# Patient Record
Sex: Male | Born: 1940 | Race: Black or African American | Hispanic: No | Marital: Married | Smoking: Former smoker
Health system: Southern US, Community
[De-identification: ages and names within clinical notes are randomized; demographics above are authoritative.]

## PROBLEM LIST (undated history)

## (undated) DIAGNOSIS — N189 Chronic kidney disease, unspecified: Secondary | ICD-10-CM

## (undated) DIAGNOSIS — I10 Essential (primary) hypertension: Secondary | ICD-10-CM

## (undated) DIAGNOSIS — Z992 Dependence on renal dialysis: Secondary | ICD-10-CM

## (undated) DIAGNOSIS — E119 Type 2 diabetes mellitus without complications: Secondary | ICD-10-CM

---

## 2001-12-08 ENCOUNTER — Inpatient Hospital Stay (HOSPITAL_COMMUNITY): Admission: EM | Admit: 2001-12-08 | Discharge: 2001-12-11 | Payer: Self-pay | Admitting: *Deleted

## 2001-12-08 ENCOUNTER — Encounter: Payer: Self-pay | Admitting: *Deleted

## 2011-11-03 DIAGNOSIS — E119 Type 2 diabetes mellitus without complications: Secondary | ICD-10-CM | POA: Diagnosis not present

## 2011-11-03 DIAGNOSIS — L609 Nail disorder, unspecified: Secondary | ICD-10-CM | POA: Diagnosis not present

## 2011-11-03 DIAGNOSIS — L851 Acquired keratosis [keratoderma] palmaris et plantaris: Secondary | ICD-10-CM | POA: Diagnosis not present

## 2011-11-11 DIAGNOSIS — E785 Hyperlipidemia, unspecified: Secondary | ICD-10-CM | POA: Diagnosis not present

## 2011-11-11 DIAGNOSIS — Z6841 Body Mass Index (BMI) 40.0 and over, adult: Secondary | ICD-10-CM | POA: Diagnosis not present

## 2011-11-11 DIAGNOSIS — I10 Essential (primary) hypertension: Secondary | ICD-10-CM | POA: Diagnosis not present

## 2011-11-11 DIAGNOSIS — E119 Type 2 diabetes mellitus without complications: Secondary | ICD-10-CM | POA: Diagnosis not present

## 2011-11-11 DIAGNOSIS — Z23 Encounter for immunization: Secondary | ICD-10-CM | POA: Diagnosis not present

## 2012-04-05 DIAGNOSIS — Z23 Encounter for immunization: Secondary | ICD-10-CM | POA: Diagnosis not present

## 2012-05-18 DIAGNOSIS — Z6841 Body Mass Index (BMI) 40.0 and over, adult: Secondary | ICD-10-CM | POA: Diagnosis not present

## 2012-05-18 DIAGNOSIS — IMO0001 Reserved for inherently not codable concepts without codable children: Secondary | ICD-10-CM | POA: Diagnosis not present

## 2012-05-18 DIAGNOSIS — Z7182 Exercise counseling: Secondary | ICD-10-CM | POA: Diagnosis not present

## 2012-05-18 DIAGNOSIS — Z713 Dietary counseling and surveillance: Secondary | ICD-10-CM | POA: Diagnosis not present

## 2012-05-22 DIAGNOSIS — I10 Essential (primary) hypertension: Secondary | ICD-10-CM | POA: Diagnosis not present

## 2012-05-22 DIAGNOSIS — Z7182 Exercise counseling: Secondary | ICD-10-CM | POA: Diagnosis not present

## 2012-05-22 DIAGNOSIS — E785 Hyperlipidemia, unspecified: Secondary | ICD-10-CM | POA: Diagnosis not present

## 2012-05-22 DIAGNOSIS — IMO0001 Reserved for inherently not codable concepts without codable children: Secondary | ICD-10-CM | POA: Diagnosis not present

## 2012-06-14 DIAGNOSIS — E119 Type 2 diabetes mellitus without complications: Secondary | ICD-10-CM | POA: Diagnosis not present

## 2012-06-14 DIAGNOSIS — H2589 Other age-related cataract: Secondary | ICD-10-CM | POA: Diagnosis not present

## 2012-06-14 DIAGNOSIS — H5200 Hypermetropia, unspecified eye: Secondary | ICD-10-CM | POA: Diagnosis not present

## 2012-06-14 DIAGNOSIS — H35039 Hypertensive retinopathy, unspecified eye: Secondary | ICD-10-CM | POA: Diagnosis not present

## 2012-07-14 DIAGNOSIS — E119 Type 2 diabetes mellitus without complications: Secondary | ICD-10-CM | POA: Diagnosis not present

## 2012-07-14 DIAGNOSIS — Z125 Encounter for screening for malignant neoplasm of prostate: Secondary | ICD-10-CM | POA: Diagnosis not present

## 2013-01-02 DIAGNOSIS — Z6841 Body Mass Index (BMI) 40.0 and over, adult: Secondary | ICD-10-CM | POA: Diagnosis not present

## 2013-01-02 DIAGNOSIS — I10 Essential (primary) hypertension: Secondary | ICD-10-CM | POA: Diagnosis not present

## 2013-01-02 DIAGNOSIS — E119 Type 2 diabetes mellitus without complications: Secondary | ICD-10-CM | POA: Diagnosis not present

## 2013-01-02 DIAGNOSIS — E785 Hyperlipidemia, unspecified: Secondary | ICD-10-CM | POA: Diagnosis not present

## 2013-02-23 ENCOUNTER — Emergency Department (HOSPITAL_COMMUNITY): Payer: Medicare Other

## 2013-02-23 ENCOUNTER — Inpatient Hospital Stay (HOSPITAL_COMMUNITY)
Admission: EM | Admit: 2013-02-23 | Discharge: 2013-02-26 | DRG: 637 | Disposition: A | Discharge status: Home / Self Care | Payer: Medicare Other | Attending: Internal Medicine | Admitting: Internal Medicine

## 2013-02-23 ENCOUNTER — Encounter (HOSPITAL_COMMUNITY): Payer: Self-pay | Admitting: *Deleted

## 2013-02-23 DIAGNOSIS — E669 Obesity, unspecified: Secondary | ICD-10-CM | POA: Diagnosis present

## 2013-02-23 DIAGNOSIS — E1310 Other specified diabetes mellitus with ketoacidosis without coma: Secondary | ICD-10-CM | POA: Diagnosis present

## 2013-02-23 DIAGNOSIS — E785 Hyperlipidemia, unspecified: Secondary | ICD-10-CM | POA: Diagnosis present

## 2013-02-23 DIAGNOSIS — I10 Essential (primary) hypertension: Secondary | ICD-10-CM | POA: Diagnosis present

## 2013-02-23 DIAGNOSIS — E1129 Type 2 diabetes mellitus with other diabetic kidney complication: Secondary | ICD-10-CM | POA: Diagnosis present

## 2013-02-23 DIAGNOSIS — N179 Acute kidney failure, unspecified: Secondary | ICD-10-CM | POA: Diagnosis present

## 2013-02-23 DIAGNOSIS — E1165 Type 2 diabetes mellitus with hyperglycemia: Secondary | ICD-10-CM | POA: Diagnosis present

## 2013-02-23 DIAGNOSIS — N289 Disorder of kidney and ureter, unspecified: Secondary | ICD-10-CM

## 2013-02-23 DIAGNOSIS — N058 Unspecified nephritic syndrome with other morphologic changes: Secondary | ICD-10-CM | POA: Diagnosis present

## 2013-02-23 DIAGNOSIS — Z79899 Other long term (current) drug therapy: Secondary | ICD-10-CM

## 2013-02-23 DIAGNOSIS — E1110 Type 2 diabetes mellitus with ketoacidosis without coma: Secondary | ICD-10-CM | POA: Diagnosis not present

## 2013-02-23 DIAGNOSIS — N189 Chronic kidney disease, unspecified: Secondary | ICD-10-CM | POA: Diagnosis not present

## 2013-02-23 DIAGNOSIS — E1122 Type 2 diabetes mellitus with diabetic chronic kidney disease: Secondary | ICD-10-CM | POA: Insufficient documentation

## 2013-02-23 DIAGNOSIS — E119 Type 2 diabetes mellitus without complications: Secondary | ICD-10-CM

## 2013-02-23 DIAGNOSIS — N182 Chronic kidney disease, stage 2 (mild): Secondary | ICD-10-CM | POA: Diagnosis present

## 2013-02-23 DIAGNOSIS — I129 Hypertensive chronic kidney disease with stage 1 through stage 4 chronic kidney disease, or unspecified chronic kidney disease: Secondary | ICD-10-CM | POA: Diagnosis present

## 2013-02-23 DIAGNOSIS — IMO0001 Reserved for inherently not codable concepts without codable children: Secondary | ICD-10-CM

## 2013-02-23 DIAGNOSIS — R5381 Other malaise: Secondary | ICD-10-CM | POA: Diagnosis not present

## 2013-02-23 DIAGNOSIS — G9340 Encephalopathy, unspecified: Secondary | ICD-10-CM | POA: Diagnosis not present

## 2013-02-23 DIAGNOSIS — Z6835 Body mass index (BMI) 35.0-35.9, adult: Secondary | ICD-10-CM

## 2013-02-23 DIAGNOSIS — R404 Transient alteration of awareness: Secondary | ICD-10-CM | POA: Diagnosis not present

## 2013-02-23 DIAGNOSIS — R5383 Other fatigue: Secondary | ICD-10-CM | POA: Diagnosis not present

## 2013-02-23 DIAGNOSIS — E870 Hyperosmolality and hypernatremia: Secondary | ICD-10-CM | POA: Diagnosis not present

## 2013-02-23 DIAGNOSIS — R9431 Abnormal electrocardiogram [ECG] [EKG]: Secondary | ICD-10-CM | POA: Diagnosis present

## 2013-02-23 HISTORY — DX: Essential (primary) hypertension: I10

## 2013-02-23 HISTORY — DX: Type 2 diabetes mellitus without complications: E11.9

## 2013-02-23 LAB — BASIC METABOLIC PANEL
BUN: 66 mg/dL — ABNORMAL HIGH (ref 6–23)
BUN: 60 mg/dL — ABNORMAL HIGH (ref 6–23)
CO2: 21 mEq/L (ref 19–32)
Calcium: 9.3 mg/dL (ref 8.4–10.5)
Calcium: 8.9 mg/dL (ref 8.4–10.5)
Calcium: 9 mg/dL (ref 8.4–10.5)
Calcium: 9.1 mg/dL (ref 8.4–10.5)
Chloride: 129 mEq/L — ABNORMAL HIGH (ref 96–112)
Creatinine, Ser: 2.67 mg/dL — ABNORMAL HIGH (ref 0.50–1.35)
Creatinine, Ser: 1.97 mg/dL — ABNORMAL HIGH (ref 0.50–1.35)
Creatinine, Ser: 2.03 mg/dL — ABNORMAL HIGH (ref 0.50–1.35)
GFR calc Af Amer: 31 mL/min — ABNORMAL LOW (ref >90)
GFR calc Af Amer: 38 mL/min — ABNORMAL LOW (ref >90)
GFR calc Af Amer: 37 mL/min — ABNORMAL LOW (ref >90)
GFR calc non Af Amer: 27 mL/min — ABNORMAL LOW (ref >90)
GFR calc non Af Amer: 33 mL/min — ABNORMAL LOW (ref >90)
GFR calc non Af Amer: 32 mL/min — ABNORMAL LOW (ref >90)
Glucose, Bld: 844 mg/dL (ref 70–99)
Potassium: 4.1 mEq/L (ref 3.5–5.1)
Sodium: 152 mEq/L — ABNORMAL HIGH (ref 135–145)

## 2013-02-23 LAB — URINALYSIS, ROUTINE W REFLEX MICROSCOPIC
Bilirubin Urine: NEGATIVE
Protein, ur: NEGATIVE mg/dL
Urobilinogen, UA: 0.2 mg/dL (ref 0.0–1.0)

## 2013-02-23 LAB — LIPID PANEL
Cholesterol: 154 mg/dL (ref 0–200)
HDL: 40 mg/dL (ref >39)
LDL Cholesterol: 60 mg/dL (ref 0–99)
Triglycerides: 268 mg/dL — ABNORMAL HIGH (ref <150)
VLDL: 54 mg/dL — ABNORMAL HIGH (ref 0–40)

## 2013-02-23 LAB — GLUCOSE, CAPILLARY
Glucose-Capillary: >600 mg/dL (ref 70–99)
Glucose-Capillary: >600 mg/dL (ref 70–99)
Glucose-Capillary: >600 mg/dL (ref 70–99)
Glucose-Capillary: 479 mg/dL — ABNORMAL HIGH (ref 70–99)
Glucose-Capillary: 396 mg/dL — ABNORMAL HIGH (ref 70–99)
Glucose-Capillary: 196 mg/dL — ABNORMAL HIGH (ref 70–99)

## 2013-02-23 LAB — CBC WITH DIFFERENTIAL/PLATELET
Basophils Absolute: 0 10*3/uL (ref 0.0–0.1)
Basophils Relative: 0 % (ref 0–1)
Eosinophils Absolute: 0 10*3/uL (ref 0.0–0.7)
Eosinophils Relative: 0 % (ref 0–5)
Lymphocytes Relative: 13 % (ref 12–46)
MCV: 93.5 fL (ref 78.0–100.0)
Platelets: 160 10*3/uL (ref 150–400)
RDW: 13.9 % (ref 11.5–15.5)
WBC: 9.7 10*3/uL (ref 4.0–10.5)

## 2013-02-23 LAB — BLOOD GAS, ARTERIAL
FIO2: 21 %
O2 Saturation: 91.6 %
Patient temperature: 37
pO2, Arterial: 70.1 mmHg — ABNORMAL LOW (ref 80.0–100.0)

## 2013-02-23 LAB — URINE MICROSCOPIC-ADD ON

## 2013-02-23 LAB — KETONES, QUALITATIVE

## 2013-02-23 MED ORDER — SODIUM CHLORIDE 0.9 % IV SOLN
INTRAVENOUS | Status: DC
  Filled 2013-02-23: qty 1

## 2013-02-23 MED ORDER — SODIUM CHLORIDE 0.45 % IV BOLUS
500.0000 mL | Freq: Once | INTRAVENOUS | Status: AC
  Administered 2013-02-23: 17:00:00 via INTRAVENOUS

## 2013-02-23 MED ORDER — SODIUM CHLORIDE 0.9 % IV SOLN
1000.0000 mL | Freq: Once | INTRAVENOUS | Status: AC
  Administered 2013-02-23: 1000 mL via INTRAVENOUS

## 2013-02-23 MED ORDER — DEXTROSE-NACL 5-0.45 % IV SOLN
INTRAVENOUS | Status: DC
  Administered 2013-02-23: 23:00:00 via INTRAVENOUS

## 2013-02-23 MED ORDER — SODIUM CHLORIDE 0.9 % IV SOLN
INTRAVENOUS | Status: DC
  Administered 2013-02-23: 5.4 [IU]/h via INTRAVENOUS
  Administered 2013-02-23: 10.8 [IU]/h via INTRAVENOUS
  Administered 2013-02-23: 23:00:00 via INTRAVENOUS
  Administered 2013-02-23: 5.4 [IU]/h via INTRAVENOUS
  Filled 2013-02-23: qty 1

## 2013-02-23 MED ORDER — SODIUM CHLORIDE 0.9 % IV SOLN: 1000.0000 mL | INTRAVENOUS | Status: DC

## 2013-02-23 MED ORDER — SODIUM CHLORIDE 0.9 % IV SOLN: INTRAVENOUS | Status: DC

## 2013-02-23 MED ORDER — DEXTROSE 50 % IV SOLN: 25.0000 mL | INTRAVENOUS | Status: DC | PRN

## 2013-02-23 MED ORDER — SODIUM CHLORIDE 0.9 % IV SOLN: INTRAVENOUS | Status: AC

## 2013-02-23 MED ORDER — ONDANSETRON HCL 4 MG/2ML IJ SOLN: 4.0000 mg | Freq: Three times a day (TID) | INTRAMUSCULAR | Status: DC | PRN

## 2013-02-23 MED ORDER — SODIUM CHLORIDE 0.9 % IV SOLN
INTRAVENOUS | Status: DC
  Administered 2013-02-23: 15:00:00 via INTRAVENOUS

## 2013-02-23 MED ORDER — POTASSIUM CHLORIDE 10 MEQ/100ML IV SOLN
10.0000 meq | INTRAVENOUS | Status: AC
  Administered 2013-02-23 (×2): 10 meq via INTRAVENOUS
  Filled 2013-02-23: qty 200

## 2013-02-23 MED ORDER — ENOXAPARIN SODIUM 40 MG/0.4ML ~~LOC~~ SOLN
40.0000 mg | SUBCUTANEOUS | Status: DC
  Administered 2013-02-23 – 2013-02-25 (×3): 40 mg via SUBCUTANEOUS
  Filled 2013-02-23 (×3): qty 0.4

## 2013-02-23 NOTE — ED Notes (Signed)
Pt incontinent of urine, all lines changed, and pt cleaned. Foley catheter placed per orders.

## 2013-02-23 NOTE — ED Notes (Signed)
Pt presents via EMS secondary to weakness, hyperglycemia, and confusion per family. Pt has strong smell of urine, and noted wet/soiled clothes on. Pt continues to be incontinent of urine.  Lips and mucus membranes are dry and lips cracked. Pt is confused to time, place and events.  Pt reports glucose was 89 this morning per his machine.

## 2013-02-23 NOTE — H&P (Signed)
[facility] History and Physical  Kyle Hicks [id number] DOB: [birth year] DOA: 02/23/2013  Referring physician:  PCP: Purvis Kilts, MD  Specialists:   Chief Complaint: hyperglycemia and confusion  HPI: Kyle Hicks is a 72 y.o. male with past medical history that includes diabetes type II hypertension hyperlipidemia presents to the emergency department via EMS with chief complaint of hyperglycemia and confusion. Information is obtained from the chart and the patient but note due to patient's confusion information from him may not be reliable. Patient reports that he is not sure why he came here he didn't think there was anything wrong with him. Wife reported that she called EMS and she was concerned when patients was obviously very weak this morning. Chart also indicates that patient's glucose has been high for the last 5 days. Per EMS patient's blood glucose was 588 when they arrived. Patient's wife also reports that he's developed urinary frequency and has been urinating every 30 minutes. He denies dysuria hematuria. He denies any chest pain palpitations cough shortness of breath dizziness headache fever or chills. Denies any abdominal pain nausea vomiting constipation or diarrhea. He denies any skin lesions or foot ulcers. Lab values in the emergency room are significant for hemoglobin of 17.8 hematocrit 55.8 chloride 94 BUN 75 creatinine 2.67 and lactic acid of 2.9. Arterial blood gas yields a pH of 7.33 a PaO2 of 70.1 a bicarbonate of 18.6. Chest x-ray yields mild cardiac enlargement and moderate tortuosity and ectasia of the thoracic aorta. No acute findings. EKG yields a normal sinus rhythm with prolonged QT. Vital signs are stable. He is to receive 2 L of normal saline and insulin drip was started while in the emergency room. Symptoms came on gradually have worsened and characterized as severe.   Review of Systems: The patient denies anorexia, fever, weight loss,, vision loss,  decreased hearing, hoarseness, chest pain, syncope, dyspnea on exertion, peripheral edema, balance deficits, hemoptysis, abdominal pain, melena, hematochezia, severe indigestion/heartburn, hematuria, incontinence, genital sores, muscle weakness, suspicious skin lesions, transient blindness, difficulty walking, depression, unusual weight change, abnormal bleeding, enlarged lymph nodes, angioedema, and breast masses.    Past Medical History  Diagnosis Date  . Diabetes mellitus without complication    History reviewed. No pertinent past surgical history. Social History:  reports that he has never smoked. He does not have any smokeless tobacco history on file. He reports that he does not drink alcohol or use illicit drugs. Patient lives at home with his wife. He is independent with his ADLs. No Known Allergies  No family history on file. unable to obtain at the time of admission due to patient's encephalopathy  Prior to Admission medications   Medication Sig Start Date End Date Taking? Authorizing Provider  aspirin EC 81 MG tablet Take 81 mg by mouth daily.   Yes Historical Provider, MD  atorvastatin (LIPITOR) 20 MG tablet Take 20 mg by mouth daily.   Yes Historical Provider, MD  hydrochlorothiazide (HYDRODIURIL) 25 MG tablet Take 25 mg by mouth daily.   Yes Historical Provider, MD  NIFEdipine (PROCARDIA-XL/ADALAT-CC/NIFEDICAL-XL) 30 MG 24 hr tablet Take 30 mg by mouth daily.   Yes Historical Provider, MD  ramipril (ALTACE) 10 MG capsule Take 10 mg by mouth daily.   Yes Historical Provider, MD  sitaGLIPtan-metformin (JANUMET) 50-500 MG per tablet Take 1 tablet by mouth 2 (two) times daily with a meal.   Yes Historical Provider, MD   Physical Exam: Filed Vitals:   02/23/13 1130 02/23/13 1159  BP:  113/81  Pulse:  97  Resp:  23  Height:  5' 10.5" (1.791 m)  SpO2: 96% 95%     General:  Obese no acute distress cooperative, somewhat unkept  Eyes: PE RRL EOMI  ENT: Ears clear nose without  drainage oropharynx without erythema or exudate. Mucous membranes of his mouth are very dry but pain. Very poor dentition  Neck: Supple no JVD full range of motion no lymphadenopathy  Cardiovascular: Tachycardic but regular no murmur no gallop no rub trace lower extremity edema  Respiratory: Normal effort breath sounds clear to auscultation bilaterally no rhonchi no wheeze no crackles  Abdomen: Obese soft positive bowel sounds nontender in all 4 quadrants no mass no organomegaly noted  Skin: Warm and dry no rash no lesions on chronic venous changes to left lower extremity no foot ulcers noted  Musculoskeletal: No clubbing no cyanosis  Psychiatric: Calm cooperative  Neurologic: Oriented to self and place. Cranial nerves II through XII grossly intact. Muscle tone appears normal  Labs on Admission:  Basic Metabolic Panel:  Recent Labs Lab 02/23/13 1201  NA 138  K 4.8  CL 94*  CO2 21  GLUCOSE 1211*  BUN 75*  CREATININE 2.67*  CALCIUM 9.3   CBC:  Recent Labs Lab 02/23/13 1201  WBC 9.7  NEUTROABS 7.7  HGB 17.8*  HCT 55.8*  MCV 93.5  PLT 160   Cardiac Enzymes:  Recent Labs Lab 02/23/13 1201  TROPONINI <0.30   CBG:  Recent Labs Lab 02/23/13 1203 02/23/13 1319  GLUCAP >600* >600*    Radiological Exams on Admission: Dg Chest 2 View  02/23/2013   *RADIOLOGY REPORT*  Clinical Data: Weakness.  CHEST - 2 VIEW  Comparison: None  Findings: The heart is mildly enlarged.  There is a tortuosity and ectasia of the thoracic aorta.  The pulmonary hila appear normal. Focal eventration of the left hemidiaphragm is noted.  The lungs are clear.  No pleural effusion.  The bony thorax is intact.  IMPRESSION: Mild cardiac enlargement and moderate tortuosity and ectasia of the thoracic aorta. No acute pulmonary findings.   Original Report Authenticated By: Marijo Sanes, M.D.   EKG: Independently reviewed. Normal sinus rhythm with prolonged QT  Assessment/Plan  Hyperglycemic  hyperosmolar state with ketoacidosis - acute etiology uncertain, will check [id number] to determine glucose control at baseline. Discussion with wife indicates patient is very compliant with medications and somewhat compliant with diet. She does indicate however that he does not check his blood sugar frequently. Last several days he has checked it every day and has been gradually going up. No signs of infection. Urinalysis and chest x-ray unremarkable. Will admit to step down unit. Anion gap 23. Continue IV insulin via the glucose stabilizer protocol. Will aggressively hydrate monitor  CBGs and electrolytes every 2 hours. Once his glucose is less than 250 will transition his fluids and introduce a basal insulin. Will check hemoglobin A 1c  Encephalopathy acute: Likely related to #1. Already starting to improve at the time of my admitting exam. Will continue to monitor  Acute on chronic renal failure: Creatinine 2.6 on admission. Review of shows no documentation of baseline. I am presuming given his diabetes he has some degree of chronic renal failure. Will request records from Dr. Delanna Ahmadi office. Will hold any nephrotoxins and provide aggressive IV hydration.  Hypertension controlled in the emergency room. Med rec indicates that patient is on hydrochlorothiazide and Procardia XL. Will hold for now. Will monitor closely and reintroduces  indicated  Hyperlipidemia: Will check lipid panel. Patient on a statin. Will hold for now do to n.p.o. status.  Obesity: Nutritional consult   Code Status: full Family Communication: wife on phone Disposition Plan: home when ready likely 2-3 days  Time spent: 61 minutes  [facility] Pager [phone]  If 7PM-7AM, please contact night-coverage [email or web address] Password [facility] 02/23/2013, 1:55 PM      I have seen and examined this patient and I agree with the above assessment and plan.  Marzetta Board, MD [facility] [phone]

## 2013-02-23 NOTE — ED Notes (Signed)
Pt arrived via EMS secondary to weakness, and hyperglycemic episode.  Glucose of 588 per EMS

## 2013-02-23 NOTE — ED Notes (Signed)
CRITICAL VALUE ALERT  Critical value received:  Glucose 1211  Date of notification:  02/23/2013  Time of notification:  [id number]  Critical value read back Glucose 1211  Nurse who received alert: [id number]  MD notified (1st page): Rancour

## 2013-02-23 NOTE — Progress Notes (Signed)
MD made aware that 1700 BMET hemolyzed and lab was unable to run. Order canceled and scheduled to be redrawn at 1900.

## 2013-02-23 NOTE — ED Provider Notes (Signed)
CSN: [id number]     Arrival date & time 02/23/13  1127 History    This chart was scribed for Ezequiel Essex, MD by Roxan Diesel, ED scribe.  This patient was seen in room APA07 and the patient's care was started at 11:29 AM.     Chief Complaint  Patient presents with  . Diabetes    The history is provided by the patient, the EMS personnel and the spouse. No language interpreter was used.    HPI Comments: Kyle Hicks is a 72 y.o. male with h/o DM, HTN and hyperlipidemia brought in by ambulance to the Emergency Department complaining of gradually-worsening hyperglycemia that began 5 days ago.  EMS was called by a relative who became concerned when pt presented with weakness this morning.  Per EMS his blood glucose was 588 when they arrived.  Pt's wife reports that he has developed urinary frequency recently and has been urinating every 30 minutes.  He denies emesis, fever, cough, constipation, diarrhea, abdominal pain, CP, SOB, dizziness, lightheadedness, recent falls, or pain to any other area.  He states he has been eating and drinking normally.  Pt reports that he does not use insuiln but has been taking an unspecified pill for his DM.    PCP is Dr. Hilma Favors   Past Medical History  Diagnosis Date  . Diabetes mellitus without complication   . HTN (hypertension)      History reviewed. No pertinent past surgical history.   No family history on file.   History  Substance Use Topics  . Smoking status: Never Smoker   . Smokeless tobacco: Not on file  . Alcohol Use: No     Review of Systems A complete 10 system review of systems was obtained and all systems are negative except as noted in the HPI and PMH.     Allergies  Review of patient's allergies indicates no known allergies.  Home Medications  No current outpatient prescriptions on file.  BP 135/85  Pulse 94  Temp(Src) 97.5 F (36.4 C) (Oral)  Resp 18  Ht 5\' 11"  (1.803 m)  Wt 252 lb 6.8 oz (114.5 kg)  BMI  35.22 kg/m2  SpO2 97%  Physical Exam  Nursing note and vitals reviewed. Constitutional: He is oriented to person, place, and time. He appears well-developed and well-nourished. No distress.  Disheveled, smells of urine.  HENT:  Head: Normocephalic and atraumatic.  Mouth/Throat: Oropharynx is clear and moist. Mucous membranes are dry. No oropharyngeal exudate.  Dry MMs.  Eyes: Conjunctivae and EOM are normal.  Neck: Neck supple. No tracheal deviation present.  Cardiovascular: Regular rhythm and normal heart sounds.  Tachycardia present.   No murmur heard. Sinus tachycardia.  Pulmonary/Chest: Effort normal and breath sounds normal. No respiratory distress. He has no wheezes. He has no rales.  Abdominal: Soft. There is no tenderness.  Obese. Non-tender abdomen.  Musculoskeletal: Normal range of motion.  Neurological: He is alert and oriented to person, place, and time. No cranial nerve deficit.  No ataxia on finger to nose, 5/5 strength throughout  Cranial nerves 2-12 intact.  Skin: Skin is warm and dry.  Psychiatric: He has a normal mood and affect. His behavior is normal.    ED Course  Procedures (including critical care time)  DIAGNOSTIC STUDIES: Oxygen Saturation is 96% on room air, normal by my interpretation.    COORDINATION OF CARE: 11:35 AM-Discussed treatment plan which includes insulin injection, IV fluids, CXR, labs and EKG with pt at bedside and  pt agreed to plan.   12:54 PM: Informed pt that labs reveal severely elevated blood glucose and advised admission for further evaluation and treatment.  Pt expressed understanding and agreed to plan.   Labs Reviewed  KETONES, QUALITATIVE - Abnormal; Notable for the following:    Acetone, Bld SMALL (*)    All other components within normal limits  BASIC METABOLIC PANEL - Abnormal; Notable for the following:    Chloride 94 (*)    Glucose, Bld 1211 (*)    BUN 75 (*)    Creatinine, Ser 2.67 (*)    GFR calc non Af Amer 23  (*)    GFR calc Af Amer 26 (*)    All other components within normal limits  CBC WITH DIFFERENTIAL - Abnormal; Notable for the following:    RBC 5.97 (*)    Hemoglobin 17.8 (*)    HCT 55.8 (*)    Neutrophils Relative % 79 (*)    All other components within normal limits  BLOOD GAS, ARTERIAL - Abnormal; Notable for the following:    pH, Arterial 7.335 (*)    pO2, Arterial 70.1 (*)    Bicarbonate 18.6 (*)    Acid-base deficit 6.2 (*)    All other components within normal limits  URINALYSIS, ROUTINE W REFLEX MICROSCOPIC - Abnormal; Notable for the following:    Specific Gravity, Urine <1.005 (*)    Glucose, UA >1000 (*)    Hgb urine dipstick TRACE (*)    Ketones, ur TRACE (*)    All other components within normal limits  LACTIC ACID, PLASMA - Abnormal; Notable for the following:    Lactic Acid, Venous 2.9 (*)    All other components within normal limits  GLUCOSE, CAPILLARY - Abnormal; Notable for the following:    Glucose-Capillary >600 (*)    All other components within normal limits  GLUCOSE, CAPILLARY - Abnormal; Notable for the following:    Glucose-Capillary >600 (*)    All other components within normal limits  GLUCOSE, CAPILLARY - Abnormal; Notable for the following:    Glucose-Capillary >600 (*)    All other components within normal limits  BASIC METABOLIC PANEL - Abnormal; Notable for the following:    Sodium 146 (*)    BUN 66 (*)    Creatinine, Ser 2.31 (*)    GFR calc non Af Amer 27 (*)    GFR calc Af Amer 31 (*)    All other components within normal limits  LIPID PANEL - Abnormal; Notable for the following:    Triglycerides 268 (*)    VLDL 54 (*)    All other components within normal limits  MRSA PCR SCREENING  TROPONIN I  URINE MICROSCOPIC-ADD ON  BASIC METABOLIC PANEL  BASIC METABOLIC PANEL  BASIC METABOLIC PANEL  HEMOGLOBIN A1C    Dg Chest 2 View  02/23/2013   *RADIOLOGY REPORT*  Clinical Data: Weakness.  CHEST - 2 VIEW  Comparison: None  Findings: The  heart is mildly enlarged.  There is a tortuosity and ectasia of the thoracic aorta.  The pulmonary hila appear normal. Focal eventration of the left hemidiaphragm is noted.  The lungs are clear.  No pleural effusion.  The bony thorax is intact.  IMPRESSION: Mild cardiac enlargement and moderate tortuosity and ectasia of the thoracic aorta. No acute pulmonary findings.   Original Report Authenticated By: Marijo Sanes, M.D.    1. DKA (diabetic ketoacidoses)   2. Renal insufficiency   3. Diabetes mellitus without complication  4. HTN (hypertension)      MDM  From home with weakness, hyperglycemia, foul-smelling urine. Patient denies any pain. Generalized weakness and dry mucus membranes without focal deficits.  Concern for DKA. BS>600 Anion gap 23. IVF and insulin gtt started. No evidence of infection in CXR or UA.  Cr. 2.67 without comparison, suspect some element of dehydration and CKD.  Mental status and vitals stable throughout ED course.  Stepdown admission for DKA d/w Dr. Roderic Palau.   Date: 02/23/2013  Rate: 99  Rhythm: normal sinus rhythm  QRS Axis: left  Intervals: QT prolonged  ST/T Wave abnormalities: nonspecific ST/T changes  Conduction Disutrbances:none  Narrative Interpretation:   Old EKG Reviewed: none available  CRITICAL CARE Performed by: Ezequiel Essex Total critical care time: 30 Critical care time was exclusive of separately billable procedures and treating other patients. Critical care was necessary to treat or prevent imminent or life-threatening deterioration. Critical care was time spent personally by me on the following activities: development of treatment plan with patient and/or surrogate as well as nursing, discussions with consultants, evaluation of patient's response to treatment, examination of patient, obtaining history from patient or surrogate, ordering and performing treatments and interventions, ordering and review of laboratory studies, ordering and  review of radiographic studies, pulse oximetry and re-evaluation of patient's condition.   I personally performed the services described in this documentation, which was scribed in my presence. The recorded information has been reviewed and is accurate.    Ezequiel Essex, MD 02/23/13 1550

## 2013-02-24 DIAGNOSIS — E1110 Type 2 diabetes mellitus with ketoacidosis without coma: Secondary | ICD-10-CM | POA: Diagnosis not present

## 2013-02-24 DIAGNOSIS — E870 Hyperosmolality and hypernatremia: Secondary | ICD-10-CM | POA: Diagnosis not present

## 2013-02-24 DIAGNOSIS — N189 Chronic kidney disease, unspecified: Secondary | ICD-10-CM | POA: Diagnosis not present

## 2013-02-24 DIAGNOSIS — N179 Acute kidney failure, unspecified: Secondary | ICD-10-CM | POA: Diagnosis not present

## 2013-02-24 LAB — BASIC METABOLIC PANEL
BUN: 47 mg/dL — ABNORMAL HIGH (ref 6–23)
BUN: 55 mg/dL — ABNORMAL HIGH (ref 6–23)
BUN: 58 mg/dL — ABNORMAL HIGH (ref 6–23)
CO2: 26 mEq/L (ref 19–32)
CO2: 25 mEq/L (ref 19–32)
CO2: 23 mEq/L (ref 19–32)
Calcium: 8.6 mg/dL (ref 8.4–10.5)
Calcium: 9.2 mg/dL (ref 8.4–10.5)
Chloride: 116 mEq/L — ABNORMAL HIGH (ref 96–112)
Chloride: 119 mEq/L — ABNORMAL HIGH (ref 96–112)
Chloride: 119 mEq/L — ABNORMAL HIGH (ref 96–112)
Creatinine, Ser: 1.96 mg/dL — ABNORMAL HIGH (ref 0.50–1.35)
GFR calc Af Amer: 38 mL/min — ABNORMAL LOW (ref >90)
GFR calc non Af Amer: 33 mL/min — ABNORMAL LOW (ref >90)
GFR calc non Af Amer: 36 mL/min — ABNORMAL LOW (ref >90)
GFR calc non Af Amer: 39 mL/min — ABNORMAL LOW (ref >90)
Glucose, Bld: 143 mg/dL — ABNORMAL HIGH (ref 70–99)
Glucose, Bld: 157 mg/dL — ABNORMAL HIGH (ref 70–99)
Glucose, Bld: 337 mg/dL — ABNORMAL HIGH (ref 70–99)
Potassium: 3.6 mEq/L (ref 3.5–5.1)
Potassium: 3.3 mEq/L — ABNORMAL LOW (ref 3.5–5.1)
Potassium: 3.6 mEq/L (ref 3.5–5.1)
Sodium: 156 mEq/L — ABNORMAL HIGH (ref 135–145)
Sodium: 153 mEq/L — ABNORMAL HIGH (ref 135–145)
Sodium: 149 mEq/L — ABNORMAL HIGH (ref 135–145)

## 2013-02-24 LAB — GLUCOSE, CAPILLARY
Glucose-Capillary: 133 mg/dL — ABNORMAL HIGH (ref 70–99)
Glucose-Capillary: 139 mg/dL — ABNORMAL HIGH (ref 70–99)
Glucose-Capillary: 205 mg/dL — ABNORMAL HIGH (ref 70–99)
Glucose-Capillary: 174 mg/dL — ABNORMAL HIGH (ref 70–99)
Glucose-Capillary: 159 mg/dL — ABNORMAL HIGH (ref 70–99)
Glucose-Capillary: 210 mg/dL — ABNORMAL HIGH (ref 70–99)

## 2013-02-24 LAB — CBC
Hemoglobin: 17.2 g/dL — ABNORMAL HIGH (ref 13.0–17.0)
MCH: 29.7 pg (ref 26.0–34.0)
MCHC: 34.3 g/dL (ref 30.0–36.0)
MCV: 86.5 fL (ref 78.0–100.0)

## 2013-02-24 LAB — POCT I-STAT, CHEM 8
Creatinine, Ser: 2.7 mg/dL — ABNORMAL HIGH (ref 0.50–1.35)
HCT: 55 % — ABNORMAL HIGH (ref 39.0–52.0)
Hemoglobin: 18.7 g/dL — ABNORMAL HIGH (ref 13.0–17.0)
Potassium: 5.1 mEq/L (ref 3.5–5.1)
Sodium: 140 mEq/L (ref 135–145)
TCO2: 20 mmol/L (ref 0–100)

## 2013-02-24 MED ORDER — INSULIN ASPART 100 UNIT/ML ~~LOC~~ SOLN
4.0000 [IU] | Freq: Three times a day (TID) | SUBCUTANEOUS | Status: DC
  Administered 2013-02-24 – 2013-02-25 (×5): 4 [IU] via SUBCUTANEOUS

## 2013-02-24 MED ORDER — POTASSIUM CHLORIDE CRYS ER 20 MEQ PO TBCR
30.0000 meq | EXTENDED_RELEASE_TABLET | Freq: Two times a day (BID) | ORAL | Status: AC
  Administered 2013-02-24 (×2): 30 meq via ORAL
  Filled 2013-02-24 (×2): qty 1

## 2013-02-24 MED ORDER — INSULIN GLARGINE 100 UNIT/ML ~~LOC~~ SOLN
SUBCUTANEOUS | Status: AC
  Filled 2013-02-24: qty 10

## 2013-02-24 MED ORDER — SODIUM CHLORIDE 0.9 % IV SOLN
INTRAVENOUS | Status: DC
  Administered 2013-02-24: 09:00:00 via INTRAVENOUS

## 2013-02-24 MED ORDER — DEXTROSE 5 % IV SOLN
INTRAVENOUS | Status: DC
  Administered 2013-02-24: 04:00:00 via INTRAVENOUS

## 2013-02-24 MED ORDER — INSULIN ASPART 100 UNIT/ML ~~LOC~~ SOLN
0.0000 [IU] | Freq: Every day | SUBCUTANEOUS | Status: DC
  Administered 2013-02-24: 2 [IU] via SUBCUTANEOUS
  Administered 2013-02-25: 4 [IU] via SUBCUTANEOUS

## 2013-02-24 MED ORDER — INSULIN GLARGINE 100 UNIT/ML ~~LOC~~ SOLN
20.0000 [IU] | Freq: Once | SUBCUTANEOUS | Status: AC
  Administered 2013-02-24: 20 [IU] via SUBCUTANEOUS
  Filled 2013-02-24: qty 0.2

## 2013-02-24 MED ORDER — SODIUM CHLORIDE 0.45 % IV SOLN
INTRAVENOUS | Status: DC
  Administered 2013-02-24 – 2013-02-25 (×3): via INTRAVENOUS

## 2013-02-24 MED ORDER — INSULIN ASPART 100 UNIT/ML ~~LOC~~ SOLN
0.0000 [IU] | Freq: Three times a day (TID) | SUBCUTANEOUS | Status: DC
  Administered 2013-02-24: 3 [IU] via SUBCUTANEOUS
  Administered 2013-02-24 (×2): 15 [IU] via SUBCUTANEOUS

## 2013-02-24 MED ORDER — INSULIN ASPART 100 UNIT/ML ~~LOC~~ SOLN
0.0000 [IU] | Freq: Three times a day (TID) | SUBCUTANEOUS | Status: DC
  Administered 2013-02-25 – 2013-02-26 (×4): 15 [IU] via SUBCUTANEOUS
  Administered 2013-02-26: 11 [IU] via SUBCUTANEOUS

## 2013-02-24 NOTE — Progress Notes (Signed)
[facility] PROGRESS NOTE  SHIRLEY DUDOIT [id number] DOB: [birth year] DOA: 02/23/2013 PCP: Purvis Kilts, MD  HPI: 72 y.o. male with past medical history that includes diabetes type II hypertension hyperlipidemia presents to the emergency department via EMS with chief complaint of hyperglycemia and confusion.   Assessment/Plan: Hyperglycemic hyperosmolar state with ketoacidosis - acute etiology uncertain - [id number] which shows chronic hyperglycemia and poorly controlled DM. He needs insulin as part of his home diabetic regimen. Will start basal/bolus and adjust.  Hypernatremia - suspect degree of dehydration. Improving. Monitor.  Encephalopathy acute: Likely related to #1, improved. Acute on chronic renal failure: Creatinine 2.6 on admission. Review of shows no documentation of baseline. Improving with hydration.  Hypertension controlled  Med rec indicates that patient is on hydrochlorothiazide and Procardia XL. Will hold for now. Will monitor closely and reintroduces indicated  Hyperlipidemia: continue statin  Code Status: Full Family Communication: None today  Disposition Plan: Home in medically ready, hopefully in one to 2 days  Consultants:  None  Procedures:  None  Anti-infectives   None     Antibiotics Given (last 72 hours)   None      HPI/Subjective: - Feeling well this morning, more alert than yesterday  Objective: Filed Vitals:   02/24/13 0500 02/24/13 0600 02/24/13 0715 02/24/13 0800  BP:    127/84  Pulse: 90 90  93  Temp:   97.8 F (36.6 C)   TempSrc:   Oral   Resp:    17  Height:      Weight: 116 kg (255 lb 11.7 oz)     SpO2: 94% 97%  97%    Intake/Output Summary (Last 24 hours) at 02/24/13 1004 Last data filed at 02/24/13 0800  Gross per 24 hour  Intake 960.78 ml  Output   2025 ml  Net -1064.22 ml   Filed Weights   02/23/13 1453 02/24/13 0500  Weight: 114.5 kg (252 lb 6.8 oz) 116 kg (255 lb 11.7 oz)    Exam:  General:   NAD  Cardiovascular: regular rate and rhythm, without MRG  Respiratory: good air movement, clear to auscultation throughout, no wheezing, ronchi or rales  Abdomen: soft, not tender to palpation, positive bowel sounds  MSK: no peripheral edema  Neuro: CN 2-12 grossly intact, MS 5/5 in all 4  Data Reviewed: Basic Metabolic Panel:  Recent Labs Lab 02/23/13 1854 02/23/13 2050 02/23/13 2349 02/24/13 0259 02/24/13 0505  NA 138 152* 156* 156* 153*  K 3.7 3.7 3.4* 3.6 3.3*  CL 129* 115* 119* 119* 116*  CO2 22 25 26 26 25   GLUCOSE 503* 342* 184* 143* 157*  BUN 60* 61* 58* 56* 55*  CREATININE 1.97* 2.03* 1.96* 1.85* 1.80*  CALCIUM 9.0 9.1 9.2 9.4 9.1   CBC:  Recent Labs Lab 02/23/13 1201 02/23/13 1206 02/24/13 0510  WBC 9.7  --  10.1  NEUTROABS 7.7  --   --   HGB 17.8* 18.7* 17.2*  HCT 55.8* 55.0* 50.1  MCV 93.5  --  86.5  PLT 160  --  150   Cardiac Enzymes:  Recent Labs Lab 02/23/13 1201  TROPONINI <0.30   CBG:  Recent Labs Lab 02/24/13 0437 02/24/13 0549 02/24/13 0647 02/24/13 0753 02/24/13 0854  GLUCAP 205* 166* 156* 174* 159*    Recent Results (from the past 240 hour(s))  MRSA PCR SCREENING     Status: None   Collection Time    02/23/13  3:00 PM  Result Value Range Status   MRSA by PCR NEGATIVE  NEGATIVE Final   Comment:            The GeneXpert MRSA Assay (FDA     approved for NASAL specimens     only), is one component of a     comprehensive MRSA colonization     surveillance program. It is not     intended to diagnose MRSA     infection nor to guide or     monitor treatment for     MRSA infections.     Studies: Dg Chest 2 View  02/23/2013   *RADIOLOGY REPORT*  Clinical Data: Weakness.  CHEST - 2 VIEW  Comparison: None  Findings: The heart is mildly enlarged.  There is a tortuosity and ectasia of the thoracic aorta.  The pulmonary hila appear normal. Focal eventration of the left hemidiaphragm is noted.  The lungs are clear.  No  pleural effusion.  The bony thorax is intact.  IMPRESSION: Mild cardiac enlargement and moderate tortuosity and ectasia of the thoracic aorta. No acute pulmonary findings.   Original Report Authenticated By: Marijo Sanes, M.D.    Scheduled Meds: . enoxaparin (LOVENOX) injection  40 mg Subcutaneous Q24H  . insulin aspart  0-15 Units Subcutaneous TID WC   Continuous Infusions: . sodium chloride 75 mL/hr at 02/24/13 [id number]    Principal Problem:   DKA (diabetic ketoacidoses) Active Problems:   Encephalopathy acute   Acute on chronic renal failure   Obesity   HTN (hypertension)   Time spent: [place], MD [facility] Pager [phone]. If 7 PM - 7 AM, please contact night-coverage at [email or web address], password [facility] 02/24/2013, 10:04 AM  LOS: 1 day

## 2013-02-24 NOTE — Progress Notes (Signed)
Report called and given to Carroll Kinds, RN. Patient being transferred to dept 300, room 316. Patient alert, oriented, in stable condition with family at bedside upon transfer.

## 2013-02-24 NOTE — Progress Notes (Signed)
Insulin gtt turned off, SSI started and breakfast tray ordered.

## 2013-02-25 DIAGNOSIS — E119 Type 2 diabetes mellitus without complications: Secondary | ICD-10-CM

## 2013-02-25 DIAGNOSIS — N179 Acute kidney failure, unspecified: Secondary | ICD-10-CM | POA: Diagnosis not present

## 2013-02-25 DIAGNOSIS — E1110 Type 2 diabetes mellitus with ketoacidosis without coma: Secondary | ICD-10-CM | POA: Diagnosis not present

## 2013-02-25 DIAGNOSIS — Z794 Long term (current) use of insulin: Secondary | ICD-10-CM

## 2013-02-25 DIAGNOSIS — N189 Chronic kidney disease, unspecified: Secondary | ICD-10-CM | POA: Diagnosis not present

## 2013-02-25 LAB — BASIC METABOLIC PANEL
BUN: 37 mg/dL — ABNORMAL HIGH (ref 6–23)
CO2: 23 mEq/L (ref 19–32)
Glucose, Bld: 321 mg/dL — ABNORMAL HIGH (ref 70–99)
Potassium: 4.2 mEq/L (ref 3.5–5.1)
Sodium: 145 mEq/L (ref 135–145)

## 2013-02-25 LAB — CBC
HCT: 48.9 % (ref 39.0–52.0)
Hemoglobin: 16.1 g/dL (ref 13.0–17.0)
MCHC: 32.9 g/dL (ref 30.0–36.0)
RBC: 5.53 MIL/uL (ref 4.22–5.81)

## 2013-02-25 LAB — GLUCOSE, CAPILLARY
Glucose-Capillary: 302 mg/dL — ABNORMAL HIGH (ref 70–99)
Glucose-Capillary: 303 mg/dL — ABNORMAL HIGH (ref 70–99)

## 2013-02-25 MED ORDER — INSULIN GLARGINE 100 UNIT/ML ~~LOC~~ SOLN
25.0000 [IU] | Freq: Every day | SUBCUTANEOUS | Status: DC
  Administered 2013-02-25: 25 [IU] via SUBCUTANEOUS
  Filled 2013-02-25 (×2): qty 0.25

## 2013-02-25 MED ORDER — POLYETHYLENE GLYCOL 3350 17 G PO PACK
17.0000 g | PACK | Freq: Every day | ORAL | Status: DC
  Administered 2013-02-26: 17 g via ORAL
  Filled 2013-02-25 (×2): qty 1

## 2013-02-25 NOTE — Progress Notes (Signed)
[facility] PROGRESS NOTE  ARIB ULCH [id number] DOB: 07/13/1941 DOA: 02/23/2013 PCP: Purvis Kilts, MD  HPI: 72 y.o. male with past medical history that includes diabetes type II hypertension hyperlipidemia presents to the emergency department via EMS with chief complaint of hyperglycemia and confusion.   Assessment/Plan: Hyperglycemic hyperosmolar state with ketoacidosis - acute etiology uncertain - [id number] which shows chronic hyperglycemia and poorly controlled DM. He needs insulin as part of his home diabetic regimen. Will start basal/bolus and adjust.  - diabetes educator to see him tomorrow Hypernatremia - suspect degree of dehydration. Improving. Monitor.  Encephalopathy acute: Likely related to #1. Resolved Acute on chronic renal failure: Creatinine 2.6 on admission. Review of shows no documentation of baseline. 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 Improving Obesity - discussed today about weight loss and the importance for long-term management of his diabetes Hypertension controlled  Med rec indicates that patient is on hydrochlorothiazide and Procardia XL. Will hold for now. Will monitor closely and reintroduces indicated  Hyperlipidemia: continue statin  Code Status: Full Family Communication: None today  Disposition Plan: Home when medically ready, hopefully in one to 2 days  Consultants:  None  Procedures:  None  Anti-infectives   None     Antibiotics Given (last 72 hours)   None      HPI/Subjective: - Feeling well this morning, complains about not liking the food.  Objective: Filed Vitals:   02/24/13 2109 02/25/13 0207 02/25/13 0554 02/25/13 1032  BP: 133/86 131/78 141/79 126/85  Pulse: 96 95 95 96  Temp: 98.1 F (36.7 C) 98 F (36.7 C) 98 F (36.7 C) 98.5 F (36.9 C)  TempSrc: Oral Oral Oral Oral  Resp:  20 20 20   Height:      Weight:      SpO2: 98% 100% 99% 97%    Intake/Output Summary (Last 24 hours) at  02/25/13 1139 Last data filed at 02/25/13 0900  Gross per 24 hour  Intake 1328.33 ml  Output    850 ml  Net 478.33 ml   Filed Weights   02/23/13 1453 02/24/13 0500  Weight: 114.5 kg (252 lb 6.8 oz) 116 kg (255 lb 11.7 oz)    Exam:  General:  NAD  Cardiovascular: regular rate and rhythm, without MRG  Respiratory: good air movement, clear to auscultation throughout, no wheezing, ronchi or rales  Abdomen: soft, not tender to palpation, positive bowel sounds  MSK: no peripheral edema  Neuro: CN 2-12 grossly intact, MS 5/5 in all 4  Data Reviewed: Basic Metabolic Panel:  Recent Labs Lab 02/23/13 2349 02/24/13 0259 02/24/13 0505 02/24/13 1614 02/25/13 0557  NA 156* 156* 153* 149* 145  K 3.4* 3.6 3.3* 3.6 4.2  CL 119* 119* 116* 115* 110  CO2 26 26 25 23 23   GLUCOSE 184* 143* 157* 337* 321*  BUN 58* 56* 55* 47* 37*  CREATININE 1.96* 1.85* 1.80* 1.70* 1.48*  CALCIUM 9.2 9.4 9.1 8.6 8.2*   CBC:  Recent Labs Lab 02/23/13 1201 02/23/13 1206 02/24/13 0510 02/25/13 0557  WBC 9.7  --  10.1 7.8  NEUTROABS 7.7  --   --   --   HGB 17.8* 18.7* 17.2* 16.1  HCT 55.8* 55.0* 50.1 48.9  MCV 93.5  --  86.5 88.4  PLT 160  --  150 124*   Cardiac Enzymes:  Recent Labs Lab 02/23/13 1201  TROPONINI <0.30   CBG:  Recent Labs Lab 02/24/13 1123 02/24/13 1650 02/24/13 2114 02/25/13 0720  02/25/13 1127  GLUCAP 361* 366* 210* 302* 303*    Recent Results (from the past 240 hour(s))  MRSA PCR SCREENING     Status: None   Collection Time    02/23/13  3:00 PM      Result Value Range Status   MRSA by PCR NEGATIVE  NEGATIVE Final   Comment:            The GeneXpert MRSA Assay (FDA     approved for NASAL specimens     only), is one component of a     comprehensive MRSA colonization     surveillance program. It is not     intended to diagnose MRSA     infection nor to guide or     monitor treatment for     MRSA infections.    Studies: Dg Chest 2 View  02/23/2013    *RADIOLOGY REPORT*  Clinical Data: Weakness.  CHEST - 2 VIEW  Comparison: None  Findings: The heart is mildly enlarged.  There is a tortuosity and ectasia of the thoracic aorta.  The pulmonary hila appear normal. Focal eventration of the left hemidiaphragm is noted.  The lungs are clear.  No pleural effusion.  The bony thorax is intact.  IMPRESSION: Mild cardiac enlargement and moderate tortuosity and ectasia of the thoracic aorta. No acute pulmonary findings.   Original Report Authenticated By: Marijo Sanes, M.D.    Scheduled Meds: . enoxaparin (LOVENOX) injection  40 mg Subcutaneous Q24H  . insulin aspart  0-20 Units Subcutaneous TID WC  . insulin aspart  0-5 Units Subcutaneous QHS  . insulin aspart  4 Units Subcutaneous TID WC  . insulin glargine  25 Units Subcutaneous Daily   Continuous Infusions: . sodium chloride 50 mL/hr at 02/24/13 2129    Principal Problem:   DKA (diabetic ketoacidoses) Active Problems:   Encephalopathy acute   Acute on chronic renal failure   Obesity   HTN (hypertension)   Time spent: [place], MD [facility] Pager [phone]. If 7 PM - 7 AM, please contact night-coverage at [email or web address], password [email or web address] 02/25/2013, 11:39 AM  LOS: 2 days

## 2013-02-26 DIAGNOSIS — N179 Acute kidney failure, unspecified: Secondary | ICD-10-CM | POA: Diagnosis not present

## 2013-02-26 DIAGNOSIS — E1110 Type 2 diabetes mellitus with ketoacidosis without coma: Secondary | ICD-10-CM | POA: Diagnosis not present

## 2013-02-26 DIAGNOSIS — E870 Hyperosmolality and hypernatremia: Secondary | ICD-10-CM | POA: Diagnosis not present

## 2013-02-26 DIAGNOSIS — I10 Essential (primary) hypertension: Secondary | ICD-10-CM | POA: Diagnosis not present

## 2013-02-26 LAB — CBC
HCT: 44.9 % (ref 39.0–52.0)
Hemoglobin: 15 g/dL (ref 13.0–17.0)
MCH: 29.5 pg (ref 26.0–34.0)
MCHC: 33.4 g/dL (ref 30.0–36.0)

## 2013-02-26 LAB — BASIC METABOLIC PANEL
BUN: 27 mg/dL — ABNORMAL HIGH (ref 6–23)
GFR calc non Af Amer: 52 mL/min — ABNORMAL LOW (ref >90)
Glucose, Bld: 327 mg/dL — ABNORMAL HIGH (ref 70–99)
Potassium: 4.2 mEq/L (ref 3.5–5.1)

## 2013-02-26 LAB — GLUCOSE, CAPILLARY: Glucose-Capillary: 311 mg/dL — ABNORMAL HIGH (ref 70–99)

## 2013-02-26 MED ORDER — INSULIN PEN STARTER KIT
1.0000 | Freq: Once | Status: AC
  Administered 2013-02-26: 1
  Filled 2013-02-26: qty 1

## 2013-02-26 MED ORDER — INSULIN PEN NEEDLE 31G X 6 MM MISC: 1.0000 | Freq: Four times a day (QID) | Status: DC

## 2013-02-26 MED ORDER — LIVING WELL WITH DIABETES BOOK
Freq: Once | Status: AC
  Administered 2013-02-26: 10:00:00
  Filled 2013-02-26: qty 1

## 2013-02-26 MED ORDER — INSULIN ASPART 100 UNIT/ML ~~LOC~~ SOLN
6.0000 [IU] | Freq: Three times a day (TID) | SUBCUTANEOUS | Status: DC
  Administered 2013-02-26 (×2): 6 [IU] via SUBCUTANEOUS

## 2013-02-26 MED ORDER — RAMIPRIL 10 MG PO CAPS
10.0000 mg | ORAL_CAPSULE | Freq: Every day | ORAL | Status: DC
  Administered 2013-02-26: 10 mg via ORAL
  Filled 2013-02-26: qty 1

## 2013-02-26 MED ORDER — INSULIN ASPART 100 UNIT/ML FLEXPEN: 6.0000 [IU] | Freq: Three times a day (TID) | SUBCUTANEOUS | Status: DC

## 2013-02-26 MED ORDER — INSULIN GLARGINE 100 UNITS/ML SOLOSTAR PEN: 30.0000 [IU] | PEN_INJECTOR | Freq: Every day | SUBCUTANEOUS | Status: DC

## 2013-02-26 MED ORDER — INSULIN GLARGINE 100 UNIT/ML ~~LOC~~ SOLN
30.0000 [IU] | Freq: Every day | SUBCUTANEOUS | Status: DC
  Administered 2013-02-26: 30 [IU] via SUBCUTANEOUS
  Filled 2013-02-26 (×2): qty 0.3

## 2013-02-26 NOTE — Progress Notes (Signed)
Spoke with pt about diabetes and diabetes management with addition of insulin. Discussed A1C results (12.2%)  with him and explained what an A1C is, basic pathophysiology of DM Type 2, basic home care, importance of checking CBGs and maintaining good CBG control to prevent long-term and short-term complications.  Patient reports that he use to take insulin and after taking it for about a year his blood sugar control got better and the doctor took him off of insulin and placed him on oral medications for diabetes control.  Patient reports that he checks his blood sugar about twice a month.  Have asked that he check his blood sugar four times a day (before meals and at bedtime) and keep a record of results to take with him to his follow up appointment with his PCP.  Instructed patient on insulin pens and demonstrated how to use insulin injections with an insulin pen.  Patient verbalized understanding of information discussed and is able to re-demonstrate how to use an insulin pen.    Blood glucose over the past 24 hours has ranged from 286-327 mg/dl on current regimen of Lantus 30 units daily (just increased today as last dose given was 25 units), Novolog 0-20 units AC, Novolog 0-5 units HS, and Novolog 6 units TID with meals.  Over the last 24 hours, patient has received Novolog 75 units of correction insulin (not counting Novolog meal coverage).  Patient will likely need additional basal insulin (recommend Lantus 35 units daily and allow PCP to adjust accordingly to improve glycemic control).    At time of discharge patient will need prescription for insulin pens, insulin pen needles, glucometer, testing strips, and lancets.  RNs to provide ongoing basic DM education at bedside with this patient. Have ordered educational booklet, insulin pen starter kit, and DM videos.    Thanks, Barnie Alderman, RN, MSN, CCRN Diabetes Coordinator [facility]patient [facility] [phone]

## 2013-02-26 NOTE — Care Management Note (Signed)
    Page 1 of 1   02/26/2013     1:38:22 PM   CARE MANAGEMENT NOTE 02/26/2013  Patient:  Kyle Hicks, Kyle Hicks   Account Number:  [id number]  Date Initiated:  02/26/2013  Documentation initiated by:  Theophilus Kinds  Subjective/Objective Assessment:   Pt admitted from home with DKA. Pt lives with his wife and will return home at discharge. Pt is independent with ADL's.     Action/Plan:   Pt refuses need for HH at this time. Prescription left on pts chart for glucometer. Pt for discharge today.   Anticipated DC Date:  02/26/2013   Anticipated DC Plan:  [place]  CM consult      Choice offered to / List presented to:             Status of service:  Completed, signed off Medicare Important Message given?  YES (If response is "NO", the following Medicare IM given date fields will be blank) Date Medicare IM given:  02/26/2013 Date Additional Medicare IM given:    Discharge Disposition:  HOME/SELF CARE  Per UR Regulation:    If discussed at Long Length of Stay Meetings, dates discussed:    Comments:  02/26/13 [place], RN BSN CM Pt discharged home today. No CM needs noted.  02/26/13 [place], Therapist, sports BSN CM

## 2013-02-26 NOTE — Progress Notes (Signed)
[facility] PROGRESS NOTE  Kyle Hicks [id number] DOB: 1941-06-20 DOA: 02/23/2013 PCP: Purvis Kilts, MD  Assessment/Plan: Hyperglycemic hyperosmolar state with ketoacidosis - acute etiology uncertain. HBA1C 12.2 which shows chronic hyperglycemia and poorly controlled DM. CBG ranger [phone]. Levemir increased to 30units, meal coverage 6 units with SSI. Await diabetic coordinator recommendations. Will be discharged on insulin.  Hypernatremia - suspect degree of dehydration. Resolved  Encephalopathy acute: Likely related to #1. Resolved  Acute on chronic renal failure: Creatinine 2.6 on admission. Review of shows no documentation of baseline.  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 Nephro toxins held.  Resolved. Obesity - discussed weight loss and the importance for long-term management of his diabetes. Nutritional consult.   Hypertension controlled Only fair control.  Med rec indicates that patient is on hydrochlorothiazide and Procardia XL and ramipril all held initially. Will resume ramipril. Monitor.  Hyperlipidemia: continue statin      Code Status: full Family Communication: none available Disposition Plan: home when ready   Consultants:    Procedures:  none  Antibiotics:    HPI/Subjective: Awake alert oriented. Reports has not been out of bed except to use [facility] since admission.   Objective: Filed Vitals:   02/25/13 1751 02/25/13 2123 02/26/13 0216 02/26/13 0517  BP: 139/72 154/83 147/85 152/89  Pulse: 109 97 91 87  Temp: 97.9 F (36.6 C) 98.1 F (36.7 C) 97.6 F (36.4 C) 98.1 F (36.7 C)  TempSrc: Oral Oral Oral Oral  Resp: 20 24 22 22   Height:      Weight:      SpO2: 96% 99% 99% 99%    Intake/Output Summary (Last 24 hours) at 02/26/13 1118 Last data filed at 02/26/13 0600  Gross per 24 hour  Intake 1974.17 ml  Output   1000 ml  Net 974.17 ml   Filed Weights   02/23/13 1453 02/24/13 0500  Weight: 252 lb 6.8 oz (114.5 kg) 255 lb  11.7 oz (116 kg)    Exam:   General:  Obese NAD  Cardiovascular: RRR No MGR trace LE edema  Respiratory: normal effort BS clear bilaterally no wheeze  Abdomen: obese soft +BS non-tender to palpation  Musculoskeletal: no clubbing no cyanosis.    Data Reviewed: Basic Metabolic Panel:  Recent Labs Lab 02/24/13 0259 02/24/13 0505 02/24/13 1614 02/25/13 0557 02/26/13 0451  NA 156* 153* 149* 145 144  K 3.6 3.3* 3.6 4.2 4.2  CL 119* 116* 115* 110 110  CO2 26 25 23 23 24   GLUCOSE 143* 157* 337* 321* 327*  BUN 56* 55* 47* 37* 27*  CREATININE 1.85* 1.80* 1.70* 1.48* 1.34  CALCIUM 9.4 9.1 8.6 8.2* 8.1*   Liver Function Tests: No results found for this basename: AST, ALT, ALKPHOS, BILITOT, PROT, ALBUMIN,  in the last 168 hours No results found for this basename: LIPASE, AMYLASE,  in the last 168 hours No results found for this basename: AMMONIA,  in the last 168 hours CBC:  Recent Labs Lab 02/23/13 1201 02/23/13 1206 02/24/13 0510 02/25/13 0557 02/26/13 0451  WBC 9.7  --  10.1 7.8 7.1  NEUTROABS 7.7  --   --   --   --   HGB 17.8* 18.7* 17.2* 16.1 15.0  HCT 55.8* 55.0* 50.1 48.9 44.9  MCV 93.5  --  86.5 88.4 88.2  PLT 160  --  150 124* 112*   Cardiac Enzymes:  Recent Labs Lab 02/23/13 1201  TROPONINI <0.30   BNP (last 3 results) No results found  for this basename: PROBNP,  in the last 8760 hours CBG:  Recent Labs Lab 02/25/13 0720 02/25/13 1127 02/25/13 1619 02/25/13 2127 02/26/13 0722  GLUCAP 302* 303* 319* 327* 286*    Recent Results (from the past 240 hour(s))  MRSA PCR SCREENING     Status: None   Collection Time    02/23/13  3:00 PM      Result Value Range Status   MRSA by PCR NEGATIVE  NEGATIVE Final   Comment:            The GeneXpert MRSA Assay (FDA     approved for NASAL specimens     only), is one component of a     comprehensive MRSA colonization     surveillance program. It is not     intended to diagnose MRSA     infection nor  to guide or     monitor treatment for     MRSA infections.     Studies: No results found.  Scheduled Meds: . enoxaparin (LOVENOX) injection  40 mg Subcutaneous Q24H  . insulin aspart  0-20 Units Subcutaneous TID WC  . insulin aspart  0-5 Units Subcutaneous QHS  . insulin aspart  6 Units Subcutaneous TID WC  . insulin glargine  30 Units Subcutaneous Daily  . polyethylene glycol  17 g Oral Daily   Continuous Infusions: . sodium chloride 50 mL/hr at 02/25/13 1650    Principal Problem:   DKA (diabetic ketoacidoses) Active Problems:   Encephalopathy acute   Acute on chronic renal failure   Obesity   HTN (hypertension)    Time spent: 30 minutes    [facility] Pager [phone]. If 7PM-7AM, please contact night-coverage at [email or web address], password [facility] 02/26/2013, 11:18 AM  LOS: 3 days             I have seen and examined this patient and I agree with the above assessment and plan.  Marzetta Board, MD [facility] [phone]

## 2013-02-26 NOTE — Progress Notes (Signed)
UR chart review completed.  

## 2013-02-26 NOTE — Discharge Summary (Signed)
Physician Discharge Summary  Kyle Hicks [id number] DOB: 20-Apr-1941 DOA: 02/23/2013  PCP: Purvis Kilts, MD  Admit date: 02/23/2013 Discharge date: 02/26/2013  Time spent: 45 minutes  Recommendations for Outpatient Follow-up:  1. Follow up with PCP later this week  Recommendations for primary care physician for things to follow:  Blood sugar log for further insulin adjustment Renal function  Discharge Diagnoses:  Principal Problem:   DKA (diabetic ketoacidoses) Active Problems:   Encephalopathy acute   Acute on chronic renal failure   Obesity   HTN (hypertension)  Discharge Condition: Stable  Diet recommendation: Heart healthy, diabetic  Filed Weights   02/23/13 1453 02/24/13 0500  Weight: 114.5 kg (252 lb 6.8 oz) 116 kg (255 lb 11.7 oz)   History of present illness:  Kyle Hicks is a 72 y.o. male with past medical history that includes diabetes type II hypertension hyperlipidemia presents to the emergency department via EMS with chief complaint of hyperglycemia and confusion. Information is obtained from the chart and the patient but note due to patient's confusion information from him may not be reliable. Patient reports that he is not sure why he came here he didn't think there was anything wrong with him. Wife reported that she called EMS and she was concerned when patients was obviously very weak this morning. Chart also indicates that patient's glucose has been high for the last 5 days. Per EMS patient's blood glucose was 588 when they arrived. Patient's wife also reports that he's developed urinary frequency and has been urinating every 30 minutes. He denies dysuria hematuria. He denies any chest pain palpitations cough shortness of breath dizziness headache fever or chills. Denies any abdominal pain nausea vomiting constipation or diarrhea. He denies any skin lesions or foot ulcers. Lab values in the emergency room are significant for hemoglobin of 17.8 hematocrit  55.8 chloride 94 BUN 75 creatinine 2.67 and lactic acid of 2.9. Arterial blood gas yields a pH of 7.33 a PaO2 of 70.1 a bicarbonate of 18.6. Chest x-ray yields mild cardiac enlargement and moderate tortuosity and ectasia of the thoracic aorta. No acute findings. EKG yields a normal sinus rhythm with prolonged QT. Vital signs are stable. He is to receive 2 L of normal saline and insulin drip was started while in the emergency room. Symptoms came on gradually have worsened and characterized as severe.  Hospital Course:  Hyperglycemic hyperosmolar state with ketoacidosis - acute etiology uncertain. HBA1C 12.2 which shows chronic hyperglycemia and poorly controlled DM  at home. I think patient is inserted this time, and was started on daily long-acting and short-acting insulin with meals. Diabetes educator has been consulted while in the hospital, and patient has received all the necessary education materials. He is to be started on Lantus 30 units per day, and short-acting insulin with each meal 6 units per day plus a sliding scale. I instructed the patient to check his sugars first thing in the morning and before lunch and dinner, as well as prior to going to bed. He needs to keep it is sugars into a log which he should take to his primary care doctor later this week for further insulin adjustments, if needed. Scripts have been provided to the patient. Hypernatremia - suspect degree of dehydration. Resolved  Encephalopathy acute: Likely related to #1. Resolved  Acute on chronic renal failure, CKD stage II. Creatinine 2.6 on admission. Review of shows no documentation of baseline. 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 Nephro toxins held. Resolved with  hydration.  Obesity - discussed weight loss and the importance for long-term management of his diabetes.  patient expresses understanding.  Hypertension controlled Only fair control. Med rec indicates that patient is on hydrochlorothiazide and Procardia XL  and ramipril all held initially. Will resume ramipril. Monitor. he is to resume his previous antihypertensives on discharge.  Hyperlipidemia: continue statin   Procedures:   none    Consultations:   none   Discharge Exam: Filed Vitals:   02/25/13 1751 02/25/13 2123 02/26/13 0216 02/26/13 0517  BP: 139/72 154/83 147/85 152/89  Pulse: 109 97 91 87  Temp: 97.9 F (36.6 C) 98.1 F (36.7 C) 97.6 F (36.4 C) 98.1 F (36.7 C)  TempSrc: Oral Oral Oral Oral  Resp: 20 24 22 22   Height:      Weight:      SpO2: 96% 99% 99% 99%   General: NAD Cardiovascular: RRR Respiratory: CTA biL  Discharge Instructions    Medication List    STOP taking these medications       sitaGLIPtan-metformin 50-500 MG per tablet  Commonly known as:  JANUMET      TAKE these medications       aspirin EC 81 MG tablet  Take 81 mg by mouth daily.     atorvastatin 20 MG tablet  Commonly known as:  LIPITOR  Take 20 mg by mouth daily.     hydrochlorothiazide 25 MG tablet  Commonly known as:  HYDRODIURIL  Take 25 mg by mouth daily.     insulin aspart 100 unit/ml Soln  Commonly known as:  novoLOG  Inject 6 Units into the skin 3 (three) times daily with meals.     insulin glargine 100 units/mL Soln  Commonly known as:  LANTUS  Inject 30 Units into the skin daily at 10 pm.     Insulin Pen Needle 31G X 6 MM Misc  1 each by Does not apply route 4 (four) times daily.     NIFEdipine 30 MG 24 hr tablet  Commonly known as:  PROCARDIA-XL/ADALAT-CC/NIFEDICAL-XL  Take 30 mg by mouth daily.     ramipril 10 MG capsule  Commonly known as:  ALTACE  Take 10 mg by mouth daily.           Follow-up Information   Follow up with Purvis Kilts, MD. Schedule an appointment as soon as possible for a visit in 1 week.   Contact information:   [id number] [place] [address] [phone]      The results of significant diagnostics from this hospitalization (including  imaging, microbiology, ancillary and laboratory) are listed below for reference.    Significant Diagnostic Studies: Dg Chest 2 View  02/23/2013   *RADIOLOGY REPORT*  Clinical Data: Weakness.  CHEST - 2 VIEW  Comparison: None  Findings: The heart is mildly enlarged.  There is a tortuosity and ectasia of the thoracic aorta.  The pulmonary hila appear normal. Focal eventration of the left hemidiaphragm is noted.  The lungs are clear.  No pleural effusion.  The bony thorax is intact.  IMPRESSION: Mild cardiac enlargement and moderate tortuosity and ectasia of the thoracic aorta. No acute pulmonary findings.   Original Report Authenticated By: Marijo Sanes, M.D.   Microbiology: Recent Results (from the past 240 hour(s))  MRSA PCR SCREENING     Status: None   Collection Time    02/23/13  3:00 PM      Result Value Range Status  MRSA by PCR NEGATIVE  NEGATIVE Final   Comment:            The GeneXpert MRSA Assay (FDA     approved for NASAL specimens     only), is one component of a     comprehensive MRSA colonization     surveillance program. It is not     intended to diagnose MRSA     infection nor to guide or     monitor treatment for     MRSA infections.    Labs: Basic Metabolic Panel:  Recent Labs Lab 02/24/13 0259 02/24/13 0505 02/24/13 1614 02/25/13 0557 02/26/13 0451  NA 156* 153* 149* 145 144  K 3.6 3.3* 3.6 4.2 4.2  CL 119* 116* 115* 110 110  CO2 26 25 23 23 24   GLUCOSE 143* 157* 337* 321* 327*  BUN 56* 55* 47* 37* 27*  CREATININE 1.85* 1.80* 1.70* 1.48* 1.34  CALCIUM 9.4 9.1 8.6 8.2* 8.1*   CBC:  Recent Labs Lab 02/23/13 1201 02/23/13 1206 02/24/13 0510 02/25/13 0557 02/26/13 0451  WBC 9.7  --  10.1 7.8 7.1  NEUTROABS 7.7  --   --   --   --   HGB 17.8* 18.7* 17.2* 16.1 15.0  HCT 55.8* 55.0* 50.1 48.9 44.9  MCV 93.5  --  86.5 88.4 88.2  PLT 160  --  150 124* 112*   Cardiac Enzymes:  Recent Labs Lab 02/23/13 1201  TROPONINI <0.30   CBG:  Recent  Labs Lab 02/25/13 1127 02/25/13 1619 02/25/13 2127 02/26/13 0722 02/26/13 1151  GLUCAP 303* 319* 327* 286* 311*    Signed:  Satrina Magallanes  [facility] 02/26/2013, 2:27 PM

## 2013-02-26 NOTE — Clinical Documentation Improvement (Signed)
THIS DOCUMENT IS NOT A PERMANENT PART OF THE MEDICAL RECORD  Please update your documentation with the medical record to reflect your response to this query. If you need help knowing how to do this please call [phone].  02/26/13   Dear Dr. Eugenio Hoes,  A review of the patient medical record has revealed the following indicators.  Based on your clinical judgment, please clarify and document in a progress note and/or discharge summary the clinical condition associated with the following supporting information:  Per H&P:  Acute on chronic renal failure: Creatinine 2.6 on admission. Review of shows no documentation of baseline. I am presuming given his diabetes he has some degree of chronic renal failure. Will request records from Dr. Delanna Ahmadi office. Will hold any nephrotoxins and provide aggressive IV hydration.   Labs: BUN/CR/GFR: 8/1 = 61/2.03/37 & 58/1.96/38 8/2 = 56/1.85/41 & 55/1.80/42 & 47/1.70/45 8/3 = 37/1.48/54 & 27/1.34/60   Treatment Strict I&O Monitor BMP IV 1/2NS @KVO        Possible Clinical Conditions?   _______CKD Stage I -  GFR > OR = 90 _______CKD Stage II - GFR 60-80 _______CKD Stage III - GFR 30-59 _______CKD Stage IV - GFR 15-29 _______CKD Stage V - GFR < 15 _______ESRD (End Stage Renal Disease) _______Other condition_____________ _______Cannot Clinically determine   You may use possible, probable, or suspect with inpatient documentation. possible, probable, suspected diagnoses MUST be documented at the time of discharge  Reviewed: additional documentation in the medical record; d/c summary   Thank You,  TRYSTIAN SHARRATT RN Clinical Documentation Specialist: [phone] [place]

## 2013-02-26 NOTE — Evaluation (Signed)
Physical Therapy Evaluation Patient Details Name: Kyle Hicks MRN: [id number] DOB: 08/28/1940 Today's Date: 02/26/2013 Time: [id number] PT Time Calculation (min): 26 min  PT Assessment / Plan / Recommendation History of Present Illness  Pt is admitted with hyperglycemia.  He is normally active and independent in the community.  Clinical Impression  Pt was seen for evaluation and found to be at prior functional level.  He had no gait instability and endurance was adequate.  No further PT should be needed.    PT Assessment  Patent does not need any further PT services    Follow Up Recommendations  No PT follow up    Does the patient have the potential to tolerate intense rehabilitation      Barriers to Discharge        Equipment Recommendations  None recommended by PT    Recommendations for Other Services     Frequency      Precautions / Restrictions Precautions Precautions: None Restrictions Weight Bearing Restrictions: No   Pertinent Vitals/Pain       Mobility  Bed Mobility Bed Mobility: Supine to Sit;Sit to Supine Supine to Sit: 7: Independent;HOB elevated Sit to Supine: 7: Independent;HOB flat Transfers Transfers: Sit to Stand;Stand to Sit Sit to Stand: 7: Independent;From bed Stand to Sit: 7: Independent;To bed Ambulation/Gait Ambulation/Gait Assistance: 7: Independent Ambulation Distance (Feet): 225 Feet Assistive device: None Gait Pattern: Within Functional Limits Gait velocity: WNL Stairs: Yes Stairs Assistance: 7: Independent Stair Management Technique: One rail Right;Forwards;Alternating pattern    Exercises     PT Diagnosis:    PT Problem List:   PT Treatment Interventions:       PT Goals(Current goals can be found in the care plan section) Acute Rehab PT Goals PT Goal Formulation: No goals set, d/c therapy  Visit Information  Last PT Received On: 02/26/13 History of Present Illness: Pt is admitted with hyperglycemia.  He is normally  active and independent in the community.       Prior [place] expects to be discharged to:: Private residence Living Arrangements: Spouse/significant other Available Help at Discharge: Family;Available 24 hours/day Type of Home: House Home Access: Stairs to enter [employer] of Steps: 3 Entrance Stairs-Rails: Right;Left;Can reach both Home Layout: One level Home Equipment: None Prior Function Level of Independence: Independent Communication Communication: No difficulties    Cognition  Cognition Arousal/Alertness: Awake/alert Behavior During Therapy: WFL for tasks assessed/performed Overall Cognitive Status: Within Functional Limits for tasks assessed    Extremity/Trunk Assessment Lower Extremity Assessment Lower Extremity Assessment: Overall WFL for tasks assessed Cervical / Trunk Assessment Cervical / Trunk Assessment: Normal   Balance Balance Balance Assessed: Yes High Level Balance High Level Balance Activites: Side stepping;Backward walking;Direction changes;Turns;Head turns;Sudden stops High Level Balance Comments: no LOB with above activity  End of Session PT - End of Session Equipment Utilized During Treatment: Gait belt Activity Tolerance: Patient tolerated treatment well Patient left: in bed;with bed alarm set Nurse Communication: Mobility status  GP     Sable Feil 02/26/2013, 11:47 AM

## 2013-02-26 NOTE — Progress Notes (Signed)
Patient given d/c instructions and verbalizes understanding. IV cath removed and intact. No pain/swelling at site.

## 2013-03-02 DIAGNOSIS — N289 Disorder of kidney and ureter, unspecified: Secondary | ICD-10-CM | POA: Diagnosis not present

## 2013-03-02 DIAGNOSIS — E1101 Type 2 diabetes mellitus with hyperosmolarity with coma: Secondary | ICD-10-CM | POA: Diagnosis not present

## 2013-03-02 DIAGNOSIS — Z6839 Body mass index (BMI) 39.0-39.9, adult: Secondary | ICD-10-CM | POA: Diagnosis not present

## 2013-03-05 DIAGNOSIS — I10 Essential (primary) hypertension: Secondary | ICD-10-CM | POA: Diagnosis not present

## 2013-03-05 DIAGNOSIS — E785 Hyperlipidemia, unspecified: Secondary | ICD-10-CM | POA: Diagnosis not present

## 2013-03-05 DIAGNOSIS — IMO0001 Reserved for inherently not codable concepts without codable children: Secondary | ICD-10-CM | POA: Diagnosis not present

## 2013-03-13 DIAGNOSIS — E669 Obesity, unspecified: Secondary | ICD-10-CM | POA: Diagnosis not present

## 2013-03-13 DIAGNOSIS — E785 Hyperlipidemia, unspecified: Secondary | ICD-10-CM | POA: Diagnosis not present

## 2013-03-13 DIAGNOSIS — IMO0001 Reserved for inherently not codable concepts without codable children: Secondary | ICD-10-CM | POA: Diagnosis not present

## 2013-03-13 DIAGNOSIS — I10 Essential (primary) hypertension: Secondary | ICD-10-CM | POA: Diagnosis not present

## 2013-03-31 DIAGNOSIS — Z23 Encounter for immunization: Secondary | ICD-10-CM | POA: Diagnosis not present

## 2013-05-15 DIAGNOSIS — E785 Hyperlipidemia, unspecified: Secondary | ICD-10-CM | POA: Diagnosis not present

## 2013-05-15 DIAGNOSIS — E559 Vitamin D deficiency, unspecified: Secondary | ICD-10-CM | POA: Diagnosis not present

## 2013-05-15 DIAGNOSIS — E669 Obesity, unspecified: Secondary | ICD-10-CM | POA: Diagnosis not present

## 2013-05-15 DIAGNOSIS — I10 Essential (primary) hypertension: Secondary | ICD-10-CM | POA: Diagnosis not present

## 2013-05-15 DIAGNOSIS — IMO0001 Reserved for inherently not codable concepts without codable children: Secondary | ICD-10-CM | POA: Diagnosis not present

## 2013-05-22 DIAGNOSIS — IMO0001 Reserved for inherently not codable concepts without codable children: Secondary | ICD-10-CM | POA: Diagnosis not present

## 2013-05-22 DIAGNOSIS — E669 Obesity, unspecified: Secondary | ICD-10-CM | POA: Diagnosis not present

## 2013-05-22 DIAGNOSIS — I10 Essential (primary) hypertension: Secondary | ICD-10-CM | POA: Diagnosis not present

## 2013-05-22 DIAGNOSIS — E559 Vitamin D deficiency, unspecified: Secondary | ICD-10-CM | POA: Diagnosis not present

## 2013-05-22 DIAGNOSIS — E785 Hyperlipidemia, unspecified: Secondary | ICD-10-CM | POA: Diagnosis not present

## 2013-08-14 DIAGNOSIS — I10 Essential (primary) hypertension: Secondary | ICD-10-CM | POA: Diagnosis not present

## 2013-08-14 DIAGNOSIS — Z6841 Body Mass Index (BMI) 40.0 and over, adult: Secondary | ICD-10-CM | POA: Diagnosis not present

## 2013-08-14 DIAGNOSIS — E785 Hyperlipidemia, unspecified: Secondary | ICD-10-CM | POA: Diagnosis not present

## 2013-08-17 DIAGNOSIS — I10 Essential (primary) hypertension: Secondary | ICD-10-CM | POA: Diagnosis not present

## 2013-08-17 DIAGNOSIS — E559 Vitamin D deficiency, unspecified: Secondary | ICD-10-CM | POA: Diagnosis not present

## 2013-08-17 DIAGNOSIS — E669 Obesity, unspecified: Secondary | ICD-10-CM | POA: Diagnosis not present

## 2013-08-17 DIAGNOSIS — IMO0001 Reserved for inherently not codable concepts without codable children: Secondary | ICD-10-CM | POA: Diagnosis not present

## 2013-08-17 DIAGNOSIS — E785 Hyperlipidemia, unspecified: Secondary | ICD-10-CM | POA: Diagnosis not present

## 2013-08-23 DIAGNOSIS — IMO0001 Reserved for inherently not codable concepts without codable children: Secondary | ICD-10-CM | POA: Diagnosis not present

## 2013-08-23 DIAGNOSIS — E785 Hyperlipidemia, unspecified: Secondary | ICD-10-CM | POA: Diagnosis not present

## 2013-08-23 DIAGNOSIS — E559 Vitamin D deficiency, unspecified: Secondary | ICD-10-CM | POA: Diagnosis not present

## 2013-08-23 DIAGNOSIS — E669 Obesity, unspecified: Secondary | ICD-10-CM | POA: Diagnosis not present

## 2013-08-23 DIAGNOSIS — I10 Essential (primary) hypertension: Secondary | ICD-10-CM | POA: Diagnosis not present

## 2013-11-15 DIAGNOSIS — I10 Essential (primary) hypertension: Secondary | ICD-10-CM | POA: Diagnosis not present

## 2013-11-15 DIAGNOSIS — E785 Hyperlipidemia, unspecified: Secondary | ICD-10-CM | POA: Diagnosis not present

## 2013-11-15 DIAGNOSIS — IMO0001 Reserved for inherently not codable concepts without codable children: Secondary | ICD-10-CM | POA: Diagnosis not present

## 2013-11-22 DIAGNOSIS — E559 Vitamin D deficiency, unspecified: Secondary | ICD-10-CM | POA: Diagnosis not present

## 2013-11-22 DIAGNOSIS — E1129 Type 2 diabetes mellitus with other diabetic kidney complication: Secondary | ICD-10-CM | POA: Diagnosis not present

## 2013-11-22 DIAGNOSIS — I10 Essential (primary) hypertension: Secondary | ICD-10-CM | POA: Diagnosis not present

## 2013-11-22 DIAGNOSIS — E669 Obesity, unspecified: Secondary | ICD-10-CM | POA: Diagnosis not present

## 2013-11-22 DIAGNOSIS — E785 Hyperlipidemia, unspecified: Secondary | ICD-10-CM | POA: Diagnosis not present

## 2013-11-22 DIAGNOSIS — E1165 Type 2 diabetes mellitus with hyperglycemia: Secondary | ICD-10-CM | POA: Diagnosis not present

## 2014-03-06 DIAGNOSIS — I10 Essential (primary) hypertension: Secondary | ICD-10-CM | POA: Diagnosis not present

## 2014-03-06 DIAGNOSIS — E785 Hyperlipidemia, unspecified: Secondary | ICD-10-CM | POA: Diagnosis not present

## 2014-03-06 DIAGNOSIS — IMO0001 Reserved for inherently not codable concepts without codable children: Secondary | ICD-10-CM | POA: Diagnosis not present

## 2014-03-13 DIAGNOSIS — E669 Obesity, unspecified: Secondary | ICD-10-CM | POA: Diagnosis not present

## 2014-03-13 DIAGNOSIS — IMO0001 Reserved for inherently not codable concepts without codable children: Secondary | ICD-10-CM | POA: Diagnosis not present

## 2014-03-13 DIAGNOSIS — I10 Essential (primary) hypertension: Secondary | ICD-10-CM | POA: Diagnosis not present

## 2014-03-13 DIAGNOSIS — E785 Hyperlipidemia, unspecified: Secondary | ICD-10-CM | POA: Diagnosis not present

## 2014-04-15 DIAGNOSIS — Z23 Encounter for immunization: Secondary | ICD-10-CM | POA: Diagnosis not present

## 2014-06-07 DIAGNOSIS — E785 Hyperlipidemia, unspecified: Secondary | ICD-10-CM | POA: Diagnosis not present

## 2014-06-07 DIAGNOSIS — E119 Type 2 diabetes mellitus without complications: Secondary | ICD-10-CM | POA: Diagnosis not present

## 2014-06-07 DIAGNOSIS — I10 Essential (primary) hypertension: Secondary | ICD-10-CM | POA: Diagnosis not present

## 2014-06-14 DIAGNOSIS — I10 Essential (primary) hypertension: Secondary | ICD-10-CM | POA: Diagnosis not present

## 2014-06-14 DIAGNOSIS — E559 Vitamin D deficiency, unspecified: Secondary | ICD-10-CM | POA: Diagnosis not present

## 2014-06-14 DIAGNOSIS — E782 Mixed hyperlipidemia: Secondary | ICD-10-CM | POA: Diagnosis not present

## 2014-06-14 DIAGNOSIS — E1165 Type 2 diabetes mellitus with hyperglycemia: Secondary | ICD-10-CM | POA: Diagnosis not present

## 2014-09-10 DIAGNOSIS — I10 Essential (primary) hypertension: Secondary | ICD-10-CM | POA: Diagnosis not present

## 2014-09-10 DIAGNOSIS — E559 Vitamin D deficiency, unspecified: Secondary | ICD-10-CM | POA: Diagnosis not present

## 2014-09-10 DIAGNOSIS — E782 Mixed hyperlipidemia: Secondary | ICD-10-CM | POA: Diagnosis not present

## 2014-09-10 DIAGNOSIS — E1165 Type 2 diabetes mellitus with hyperglycemia: Secondary | ICD-10-CM | POA: Diagnosis not present

## 2014-09-10 DIAGNOSIS — Z713 Dietary counseling and surveillance: Secondary | ICD-10-CM | POA: Diagnosis not present

## 2014-09-16 DIAGNOSIS — E559 Vitamin D deficiency, unspecified: Secondary | ICD-10-CM | POA: Diagnosis not present

## 2014-09-16 DIAGNOSIS — E6609 Other obesity due to excess calories: Secondary | ICD-10-CM | POA: Diagnosis not present

## 2014-09-16 DIAGNOSIS — I10 Essential (primary) hypertension: Secondary | ICD-10-CM | POA: Diagnosis not present

## 2014-09-16 DIAGNOSIS — E782 Mixed hyperlipidemia: Secondary | ICD-10-CM | POA: Diagnosis not present

## 2014-09-16 DIAGNOSIS — E1165 Type 2 diabetes mellitus with hyperglycemia: Secondary | ICD-10-CM | POA: Diagnosis not present

## 2014-10-10 DIAGNOSIS — H25819 Combined forms of age-related cataract, unspecified eye: Secondary | ICD-10-CM | POA: Diagnosis not present

## 2014-10-10 DIAGNOSIS — E1165 Type 2 diabetes mellitus with hyperglycemia: Secondary | ICD-10-CM | POA: Diagnosis not present

## 2014-10-10 DIAGNOSIS — E11319 Type 2 diabetes mellitus with unspecified diabetic retinopathy without macular edema: Secondary | ICD-10-CM | POA: Diagnosis not present

## 2014-10-10 DIAGNOSIS — E119 Type 2 diabetes mellitus without complications: Secondary | ICD-10-CM | POA: Diagnosis not present

## 2014-12-09 DIAGNOSIS — Z713 Dietary counseling and surveillance: Secondary | ICD-10-CM | POA: Diagnosis not present

## 2014-12-09 DIAGNOSIS — E559 Vitamin D deficiency, unspecified: Secondary | ICD-10-CM | POA: Diagnosis not present

## 2014-12-09 DIAGNOSIS — E1165 Type 2 diabetes mellitus with hyperglycemia: Secondary | ICD-10-CM | POA: Diagnosis not present

## 2014-12-09 DIAGNOSIS — I10 Essential (primary) hypertension: Secondary | ICD-10-CM | POA: Diagnosis not present

## 2014-12-09 DIAGNOSIS — E782 Mixed hyperlipidemia: Secondary | ICD-10-CM | POA: Diagnosis not present

## 2014-12-16 DIAGNOSIS — E782 Mixed hyperlipidemia: Secondary | ICD-10-CM | POA: Diagnosis not present

## 2014-12-16 DIAGNOSIS — E1165 Type 2 diabetes mellitus with hyperglycemia: Secondary | ICD-10-CM | POA: Diagnosis not present

## 2014-12-16 DIAGNOSIS — I10 Essential (primary) hypertension: Secondary | ICD-10-CM | POA: Diagnosis not present

## 2015-03-14 DIAGNOSIS — I10 Essential (primary) hypertension: Secondary | ICD-10-CM | POA: Diagnosis not present

## 2015-03-14 DIAGNOSIS — E782 Mixed hyperlipidemia: Secondary | ICD-10-CM | POA: Diagnosis not present

## 2015-03-14 DIAGNOSIS — E1165 Type 2 diabetes mellitus with hyperglycemia: Secondary | ICD-10-CM | POA: Diagnosis not present

## 2015-03-21 DIAGNOSIS — E1165 Type 2 diabetes mellitus with hyperglycemia: Secondary | ICD-10-CM | POA: Diagnosis not present

## 2015-03-21 DIAGNOSIS — E6609 Other obesity due to excess calories: Secondary | ICD-10-CM | POA: Diagnosis not present

## 2015-03-21 DIAGNOSIS — E782 Mixed hyperlipidemia: Secondary | ICD-10-CM | POA: Diagnosis not present

## 2015-03-21 DIAGNOSIS — E559 Vitamin D deficiency, unspecified: Secondary | ICD-10-CM | POA: Diagnosis not present

## 2015-03-21 DIAGNOSIS — I10 Essential (primary) hypertension: Secondary | ICD-10-CM | POA: Diagnosis not present

## 2015-04-24 DIAGNOSIS — E782 Mixed hyperlipidemia: Secondary | ICD-10-CM | POA: Diagnosis not present

## 2015-04-24 DIAGNOSIS — L821 Other seborrheic keratosis: Secondary | ICD-10-CM | POA: Diagnosis not present

## 2015-04-24 DIAGNOSIS — I10 Essential (primary) hypertension: Secondary | ICD-10-CM | POA: Diagnosis not present

## 2015-04-24 DIAGNOSIS — Z0001 Encounter for general adult medical examination with abnormal findings: Secondary | ICD-10-CM | POA: Diagnosis not present

## 2015-04-24 DIAGNOSIS — Z125 Encounter for screening for malignant neoplasm of prostate: Secondary | ICD-10-CM | POA: Diagnosis not present

## 2015-04-24 DIAGNOSIS — E785 Hyperlipidemia, unspecified: Secondary | ICD-10-CM | POA: Diagnosis not present

## 2015-04-24 DIAGNOSIS — Z1389 Encounter for screening for other disorder: Secondary | ICD-10-CM | POA: Diagnosis not present

## 2015-04-24 DIAGNOSIS — Z23 Encounter for immunization: Secondary | ICD-10-CM | POA: Diagnosis not present

## 2015-04-24 DIAGNOSIS — E1165 Type 2 diabetes mellitus with hyperglycemia: Secondary | ICD-10-CM | POA: Diagnosis not present

## 2015-04-28 ENCOUNTER — Other Ambulatory Visit (HOSPITAL_COMMUNITY): Payer: Self-pay | Admitting: Family Medicine

## 2015-04-28 DIAGNOSIS — I159 Secondary hypertension, unspecified: Secondary | ICD-10-CM

## 2015-04-28 DIAGNOSIS — F17200 Nicotine dependence, unspecified, uncomplicated: Secondary | ICD-10-CM

## 2015-04-28 DIAGNOSIS — Z87891 Personal history of nicotine dependence: Secondary | ICD-10-CM

## 2015-05-02 ENCOUNTER — Ambulatory Visit (HOSPITAL_COMMUNITY): Payer: Medicare Other

## 2015-05-30 ENCOUNTER — Other Ambulatory Visit: Payer: Self-pay | Admitting: "Endocrinology

## 2015-06-17 DIAGNOSIS — E785 Hyperlipidemia, unspecified: Secondary | ICD-10-CM | POA: Diagnosis not present

## 2015-06-17 DIAGNOSIS — I10 Essential (primary) hypertension: Secondary | ICD-10-CM | POA: Diagnosis not present

## 2015-06-17 DIAGNOSIS — E1165 Type 2 diabetes mellitus with hyperglycemia: Secondary | ICD-10-CM | POA: Diagnosis not present

## 2015-06-21 ENCOUNTER — Other Ambulatory Visit: Payer: Self-pay | Admitting: "Endocrinology

## 2015-06-21 LAB — HEMOGLOBIN A1C: HEMOGLOBIN A1C: 6.4 % — AB (ref 4.0–6.0)

## 2015-06-24 ENCOUNTER — Encounter: Payer: Self-pay | Admitting: "Endocrinology

## 2015-06-24 ENCOUNTER — Ambulatory Visit (INDEPENDENT_AMBULATORY_CARE_PROVIDER_SITE_OTHER): Payer: Medicare Other | Admitting: "Endocrinology

## 2015-06-24 VITALS — BP 138/82 | HR 83 | Ht 68.0 in | Wt 253.0 lb

## 2015-06-24 DIAGNOSIS — E1122 Type 2 diabetes mellitus with diabetic chronic kidney disease: Secondary | ICD-10-CM

## 2015-06-24 DIAGNOSIS — I10 Essential (primary) hypertension: Secondary | ICD-10-CM

## 2015-06-24 DIAGNOSIS — N183 Chronic kidney disease, stage 3 unspecified: Secondary | ICD-10-CM

## 2015-06-24 DIAGNOSIS — E785 Hyperlipidemia, unspecified: Secondary | ICD-10-CM | POA: Diagnosis not present

## 2015-06-24 DIAGNOSIS — E782 Mixed hyperlipidemia: Secondary | ICD-10-CM | POA: Insufficient documentation

## 2015-06-24 NOTE — Patient Instructions (Signed)

## 2015-06-24 NOTE — Progress Notes (Signed)
Subjective:    Patient ID: Kyle Hicks, male    DOB: 1941-02-08, PCP Purvis Kilts, MD   Past Medical History  Diagnosis Date  . Diabetes mellitus without complication ([place])   . HTN (hypertension)    No past surgical history on file. Social History   Social History  . Marital Status: Married    Spouse Name: N/A  . Number of Children: N/A  . Years of Education: N/A   Social History Main Topics  . Smoking status: Former Research scientist (life sciences)  . Smokeless tobacco: Not on file  . Alcohol Use: No  . Drug Use: No  . Sexual Activity: Not on file   Other Topics Concern  . Not on file   Social History Narrative   Outpatient Encounter Prescriptions as of 06/24/2015  Medication Sig  . aspirin EC 81 MG tablet Take 81 mg by mouth daily.  Marland Kitchen atorvastatin (LIPITOR) 20 MG tablet Take 20 mg by mouth daily.  . hydrochlorothiazide (HYDRODIURIL) 25 MG tablet Take 25 mg by mouth daily.  Marland Kitchen NIFEDICAL XL 30 MG 24 hr tablet take 1 tablet by mouth once daily  . ramipril (ALTACE) 10 MG capsule ORAL every day  . sitaGLIPtin-metformin (JANUMET) 50-1000 MG tablet Take 0.5 tablets by mouth 2 (two) times daily with a meal. Break a pill in half and take 1/2 in AM and 1/2 in PM.  . Insulin Pen Needle 31G X 6 MM MISC 1 each by Does not apply route 4 (four) times daily.  . [DISCONTINUED] insulin aspart (NOVOLOG) 100 unit/ml SOLN Inject 6 Units into the skin 3 (three) times daily with meals.  . [DISCONTINUED] insulin glargine (LANTUS) 100 units/mL SOLN Inject 30 Units into the skin daily at 10 pm.   No facility-administered encounter medications on file as of 06/24/2015.   ALLERGIES: No Known Allergies VACCINATION STATUS:  There is no immunization history on file for this patient.  Diabetes He presents for his follow-up diabetic visit. He has type 2 diabetes mellitus. The initial diagnosis of diabetes was made 2 years ago. His disease course has been improving. There are no hypoglycemic associated  symptoms. Pertinent negatives for hypoglycemia include no confusion, headaches, pallor or seizures. There are no diabetic associated symptoms. Pertinent negatives for diabetes include no chest pain, no fatigue, no polydipsia, no polyphagia, no polyuria and no weakness. There are no hypoglycemic complications. Symptoms are improving. Diabetic complications include nephropathy. Risk factors for coronary artery disease include diabetes mellitus, dyslipidemia, hypertension, male sex, obesity, sedentary lifestyle and tobacco exposure. He is compliant with treatment most of the time. His weight is stable. An ACE inhibitor/angiotensin II receptor blocker is being taken.  Hypertension This is a chronic problem. The current episode started more than 1 year ago. The problem is controlled. Pertinent negatives include no chest pain, headaches, neck pain, palpitations or shortness of breath. Risk factors for coronary artery disease include diabetes mellitus, dyslipidemia, obesity, sedentary lifestyle and smoking/tobacco exposure.  Hyperlipidemia This is a chronic problem. The current episode started more than 1 year ago. Pertinent negatives include no chest pain, myalgias or shortness of breath. Current antihyperlipidemic treatment includes statins.     Review of Systems  Constitutional: Negative for fatigue and unexpected weight change.  HENT: Negative for dental problem, mouth sores and trouble swallowing.   Eyes: Negative for visual disturbance.  Respiratory: Negative for cough, choking, chest tightness, shortness of breath and wheezing.   Cardiovascular: Negative for chest pain, palpitations and leg swelling.  Gastrointestinal:  Negative for nausea, vomiting, abdominal pain, diarrhea, constipation and abdominal distention.  Endocrine: Negative for polydipsia, polyphagia and polyuria.  Genitourinary: Negative for dysuria, urgency, hematuria and flank pain.  Musculoskeletal: Negative for myalgias, back pain,  gait problem and neck pain.  Skin: Negative for pallor, rash and wound.  Neurological: Negative for seizures, syncope, weakness, numbness and headaches.  Psychiatric/Behavioral: Negative.  Negative for confusion and dysphoric mood.    Objective:    BP 138/82 mmHg  Pulse 83  Ht 5\' 8"  (1.727 m)  Wt 253 lb (114.76 kg)  BMI 38.48 kg/m2  SpO2 96%  Wt Readings from Last 3 Encounters:  06/24/15 253 lb (114.76 kg)  02/24/13 255 lb 11.7 oz (116 kg)    Physical Exam  Constitutional: He is oriented to person, place, and time. He appears well-developed and well-nourished. He is cooperative. No distress.  HENT:  Head: Normocephalic and atraumatic.  Eyes: EOM are normal.  Neck: Normal range of motion. Neck supple. No tracheal deviation present. No thyromegaly present.  Cardiovascular: Normal rate, S1 normal, S2 normal and normal heart sounds.  Exam reveals no gallop.   No murmur heard. Pulses:      Dorsalis pedis pulses are 1+ on the right side, and 1+ on the left side.       Posterior tibial pulses are 1+ on the right side, and 1+ on the left side.  Pulmonary/Chest: Breath sounds normal. No respiratory distress. He has no wheezes.  Abdominal: Soft. Bowel sounds are normal. He exhibits no distension. There is no tenderness. There is no guarding and no CVA tenderness.  Musculoskeletal: He exhibits no edema.       Right shoulder: He exhibits no swelling and no deformity.  Neurological: He is alert and oriented to person, place, and time. He has normal strength and normal reflexes. No cranial nerve deficit or sensory deficit. Gait normal.  Skin: Skin is warm and dry. No rash noted. No cyanosis. Nails show no clubbing.  Psychiatric: He has a normal mood and affect. His speech is normal and behavior is normal. Judgment and thought content normal. Cognition and memory are normal.    Results for orders placed or performed in visit on 06/24/15  Hemoglobin A1c  Result Value Ref Range   Hgb A1c MFr  Bld 6.4 (A) 4.0 - 6.0 %   Complete Blood Count (Most recent): Lab Results  Component Value Date   WBC 7.1 02/26/2013   HGB 15.0 02/26/2013   HCT 44.9 02/26/2013   MCV 88.2 02/26/2013   PLT 112* 02/26/2013   Chemistry (most recent): Lab Results  Component Value Date   NA 144 02/26/2013   K 4.2 02/26/2013   CL 110 02/26/2013   CO2 24 02/26/2013   BUN 27* 02/26/2013   CREATININE 1.34 02/26/2013   Diabetic Labs (most recent): Lab Results  Component Value Date   HGBA1C 6.4* 06/21/2015   HGBA1C 12.2* 02/23/2013   Lipid profile (most recent): Lab Results  Component Value Date   TRIG 268* 02/23/2013   CHOL 154 02/23/2013         Assessment & Plan:   1. Diabetes mellitus with stage 3 chronic kidney disease (HCC) -His diabetes is  complicated by CKD and patient remains at a high risk for more acute and chronic complications of diabetes which include CAD, CVA, CKD, retinopathy, and neuropathy. These are all discussed in detail with the patient.  Patient came with improved A1c of 6.4 %.   Recent labs reviewed.   -  I have re-counseled the patient on diet management and weight loss  by adopting a carbohydrate restricted / protein rich  Diet.  - Suggestion is made for patient to avoid simple carbohydrates   from their diet including Cakes , Desserts, Ice Cream,  Soda (  diet and regular) , Sweet Tea , Candies,  Chips, Cookies, Artificial Sweeteners,   and "Sugar-free" Products .  This will help patient to have stable blood glucose profile and potentially avoid unintended  Weight gain.  - Patient is advised to stick to a routine mealtimes to eat 3 meals  a day and avoid unnecessary snacks ( to snack only to correct hypoglycemia).  - The patient  Will be  scheduled with Jearld Fenton, RDN, CDE for individualized DM education.  - I have approached patient with the following individualized plan to manage diabetes and patient agrees.  - I will proceed with Janumet 50/1000mg  ( 1/2  pill po BID).  - Patient will be considered for incretin therapy as appropriate next visit. - Patient specific target  for A1c; LDL, HDL, Triglycerides, and  Waist Circumference were discussed in detail.  2) BP/HTN: Controlled. Continue current medications including ACEI/ARB. 3) Lipids/HPL:  continue statins. 4)  Weight/Diet: CDE consult in progress, exercise, and carbohydrates information provided.  5) Chronic Care/Health Maintenance:  -Patient is on ACEI/ARB and Statin medications and encouraged to continue to follow up with Ophthalmology, Podiatrist at least yearly or according to recommendations, and advised to stay away from smoking. I have recommended yearly flu vaccine and pneumonia vaccination at least every 5 years; moderate intensity exercise for up to 150 minutes weekly; and  sleep for at least 7 hours a day.  - 25 minutes of time was spent on the care of this patient , 50% of which was applied for counseling on diabetes complications and their preventions.  - I advised patient to maintain close follow up with Purvis Kilts, MD for primary care needs.  Patient is asked to bring meter and  blood glucose logs during their next visit.   Follow up plan: -Return in about 3 months (around 09/23/2015) for diabetes, high blood pressure, high cholesterol, follow up with pre-visit labs.  Glade Lloyd, MD Phone: [phone]  Fax: [phone]   06/24/2015, 6:04 PM

## 2015-06-25 ENCOUNTER — Other Ambulatory Visit: Payer: Self-pay | Admitting: "Endocrinology

## 2015-09-10 ENCOUNTER — Other Ambulatory Visit: Payer: Self-pay

## 2015-09-10 MED ORDER — NIFEDIPINE ER OSMOTIC RELEASE 30 MG PO TB24: 30.0000 mg | ORAL_TABLET | Freq: Every day | ORAL | Status: DC

## 2015-09-10 MED ORDER — RAMIPRIL 10 MG PO CAPS: ORAL_CAPSULE | ORAL | Status: DC

## 2015-09-15 ENCOUNTER — Other Ambulatory Visit: Payer: Self-pay | Admitting: "Endocrinology

## 2015-09-15 DIAGNOSIS — N183 Chronic kidney disease, stage 3 (moderate): Secondary | ICD-10-CM | POA: Diagnosis not present

## 2015-09-15 DIAGNOSIS — E1122 Type 2 diabetes mellitus with diabetic chronic kidney disease: Secondary | ICD-10-CM | POA: Diagnosis not present

## 2015-09-15 LAB — BASIC METABOLIC PANEL
BUN: 17 mg/dL (ref 7–25)
CHLORIDE: 104 mmol/L (ref 98–110)
CO2: 25 mmol/L (ref 20–31)
Calcium: 9.2 mg/dL (ref 8.6–10.3)
Creat: 1.23 mg/dL — ABNORMAL HIGH (ref 0.70–1.18)
Glucose, Bld: 141 mg/dL — ABNORMAL HIGH (ref 65–99)
POTASSIUM: 3.9 mmol/L (ref 3.5–5.3)
SODIUM: 141 mmol/L (ref 135–146)

## 2015-09-15 LAB — HEMOGLOBIN A1C
HEMOGLOBIN A1C: 6.7 % — AB (ref <5.7)
MEAN PLASMA GLUCOSE: 146 mg/dL — AB (ref <117)

## 2015-09-24 ENCOUNTER — Ambulatory Visit (INDEPENDENT_AMBULATORY_CARE_PROVIDER_SITE_OTHER): Payer: Medicare Other | Admitting: "Endocrinology

## 2015-09-24 ENCOUNTER — Encounter: Payer: Self-pay | Admitting: "Endocrinology

## 2015-09-24 VITALS — BP 143/81 | HR 71 | Ht 68.0 in | Wt 257.0 lb

## 2015-09-24 DIAGNOSIS — E785 Hyperlipidemia, unspecified: Secondary | ICD-10-CM | POA: Diagnosis not present

## 2015-09-24 DIAGNOSIS — N183 Chronic kidney disease, stage 3 unspecified: Secondary | ICD-10-CM

## 2015-09-24 DIAGNOSIS — I10 Essential (primary) hypertension: Secondary | ICD-10-CM | POA: Diagnosis not present

## 2015-09-24 DIAGNOSIS — E1122 Type 2 diabetes mellitus with diabetic chronic kidney disease: Secondary | ICD-10-CM

## 2015-09-24 NOTE — Progress Notes (Signed)
Subjective:    Patient ID: Kyle Hicks, male    DOB: November 18, 1940, PCP Purvis Kilts, MD   Past Medical History  Diagnosis Date  . Diabetes mellitus without complication ([place])   . HTN (hypertension)    History reviewed. No pertinent past surgical history. Social History   Social History  . Marital Status: Married    Spouse Name: N/A  . Number of Children: N/A  . Years of Education: N/A   Social History Main Topics  . Smoking status: Former Research scientist (life sciences)  . Smokeless tobacco: None  . Alcohol Use: No  . Drug Use: No  . Sexual Activity: Not Asked   Other Topics Concern  . None   Social History Narrative   Outpatient Encounter Prescriptions as of 09/24/2015  Medication Sig  . aspirin EC 81 MG tablet Take 81 mg by mouth daily.  Marland Kitchen atorvastatin (LIPITOR) 20 MG tablet Take 20 mg by mouth daily.  . hydrochlorothiazide (HYDRODIURIL) 25 MG tablet Take 25 mg by mouth daily.  Marland Kitchen JANUMET 50-1000 MG tablet ORAL two times a day  . NIFEdipine (NIFEDICAL XL) 30 MG 24 hr tablet Take 1 tablet (30 mg total) by mouth daily.  . ramipril (ALTACE) 10 MG capsule Once daily  . Insulin Pen Needle 31G X 6 MM MISC 1 each by Does not apply route 4 (four) times daily.   No facility-administered encounter medications on file as of 09/24/2015.   ALLERGIES: No Known Allergies VACCINATION STATUS:  There is no immunization history on file for this patient.  Diabetes He presents for his follow-up diabetic visit. He has type 2 diabetes mellitus. The initial diagnosis of diabetes was made 2 years ago. His disease course has been improving. There are no hypoglycemic associated symptoms. Pertinent negatives for hypoglycemia include no confusion, headaches, pallor or seizures. There are no diabetic associated symptoms. Pertinent negatives for diabetes include no chest pain, no fatigue, no polydipsia, no polyphagia, no polyuria and no weakness. There are no hypoglycemic complications. Symptoms are improving.  Diabetic complications include nephropathy. Risk factors for coronary artery disease include diabetes mellitus, dyslipidemia, hypertension, male sex, obesity, sedentary lifestyle and tobacco exposure. He is compliant with treatment most of the time. His weight is stable. An ACE inhibitor/angiotensin II receptor blocker is being taken.  Hypertension This is a chronic problem. The current episode started more than 1 year ago. The problem is controlled. Pertinent negatives include no chest pain, headaches, neck pain, palpitations or shortness of breath. Risk factors for coronary artery disease include diabetes mellitus, dyslipidemia, obesity, sedentary lifestyle and smoking/tobacco exposure.  Hyperlipidemia This is a chronic problem. The current episode started more than 1 year ago. Pertinent negatives include no chest pain, myalgias or shortness of breath. Current antihyperlipidemic treatment includes statins.     Review of Systems  Constitutional: Negative for fatigue and unexpected weight change.  HENT: Negative for dental problem, mouth sores and trouble swallowing.   Eyes: Negative for visual disturbance.  Respiratory: Negative for cough, choking, chest tightness, shortness of breath and wheezing.   Cardiovascular: Negative for chest pain, palpitations and leg swelling.  Gastrointestinal: Negative for nausea, vomiting, abdominal pain, diarrhea, constipation and abdominal distention.  Endocrine: Negative for polydipsia, polyphagia and polyuria.  Genitourinary: Negative for dysuria, urgency, hematuria and flank pain.  Musculoskeletal: Negative for myalgias, back pain, gait problem and neck pain.  Skin: Negative for pallor, rash and wound.  Neurological: Negative for seizures, syncope, weakness, numbness and headaches.  Psychiatric/Behavioral: Negative.  Negative  for confusion and dysphoric mood.    Objective:    BP 143/81 mmHg  Pulse 71  Ht 5\' 8"  (1.727 m)  Wt 257 lb (116.574 kg)  BMI  39.09 kg/m2  SpO2 97%  Wt Readings from Last 3 Encounters:  09/24/15 257 lb (116.574 kg)  06/24/15 253 lb (114.76 kg)  02/24/13 255 lb 11.7 oz (116 kg)    Physical Exam  Constitutional: He is oriented to person, place, and time. He appears well-developed and well-nourished. He is cooperative. No distress.  HENT:  Head: Normocephalic and atraumatic.  Eyes: EOM are normal.  Neck: Normal range of motion. Neck supple. No tracheal deviation present. No thyromegaly present.  Cardiovascular: Normal rate, S1 normal, S2 normal and normal heart sounds.  Exam reveals no gallop.   No murmur heard. Pulses:      Dorsalis pedis pulses are 1+ on the right side, and 1+ on the left side.       Posterior tibial pulses are 1+ on the right side, and 1+ on the left side.  Pulmonary/Chest: Breath sounds normal. No respiratory distress. He has no wheezes.  Abdominal: Soft. Bowel sounds are normal. He exhibits no distension. There is no tenderness. There is no guarding and no CVA tenderness.  Musculoskeletal: He exhibits no edema.       Right shoulder: He exhibits no swelling and no deformity.  Neurological: He is alert and oriented to person, place, and time. He has normal strength and normal reflexes. No cranial nerve deficit or sensory deficit. Gait normal.  Skin: Skin is warm and dry. No rash noted. No cyanosis. Nails show no clubbing.  Psychiatric: He has a normal mood and affect. His speech is normal and behavior is normal. Judgment and thought content normal. Cognition and memory are normal.    Results for orders placed or performed in visit on [id number]  Basic metabolic panel  Result Value Ref Range   Sodium 141 135 - 146 mmol/L   Potassium 3.9 3.5 - 5.3 mmol/L   Chloride 104 98 - 110 mmol/L   CO2 25 20 - 31 mmol/L   Glucose, Bld 141 (H) 65 - 99 mg/dL   BUN 17 7 - 25 mg/dL   Creat 1.23 (H) 0.70 - 1.18 mg/dL   Calcium 9.2 8.6 - 10.3 mg/dL  Hemoglobin A1c  Result Value Ref Range   Hgb A1c MFr Bld  6.7 (H) <5.7 %   Mean Plasma Glucose 146 (H) <117 mg/dL   Diabetic Labs (most recent): Lab Results  Component Value Date   HGBA1C 6.7* 09/15/2015   HGBA1C 6.4* 06/21/2015   HGBA1C 12.2* 02/23/2013   Lipid Panel     Component Value Date/Time   CHOL 154 02/23/2013 1512   TRIG 268* 02/23/2013 1512   HDL 40 02/23/2013 1512   CHOLHDL 3.9 02/23/2013 1512   VLDL 54* 02/23/2013 1512   LDLCALC 60 02/23/2013 1512     Assessment & Plan:   1. Diabetes mellitus with stage 3 chronic kidney disease (HCC) -His diabetes is  complicated by CKD and patient remains at a high risk for more acute and chronic complications of diabetes which include CAD, CVA, CKD, retinopathy, and neuropathy. These are all discussed in detail with the patient.  Patient came with stable A1c of 6.7 %.   Recent labs reviewed.   - I have re-counseled the patient on diet management and weight loss  by adopting a carbohydrate restricted / protein rich  Diet.  - Suggestion is  made for patient to avoid simple carbohydrates   from their diet including Cakes , Desserts, Ice Cream,  Soda (  diet and regular) , Sweet Tea , Candies,  Chips, Cookies, Artificial Sweeteners,   and "Sugar-free" Products .  This will help patient to have stable blood glucose profile and potentially avoid unintended  Weight gain.  - Patient is advised to stick to a routine mealtimes to eat 3 meals  a day and avoid unnecessary snacks ( to snack only to correct hypoglycemia).  - The patient  Will be  scheduled with Jearld Fenton, RDN, CDE for individualized DM education.  - I have approached patient with the following individualized plan to manage diabetes and patient agrees.  - I will proceed with Janumet 50/1000mg  ( 1/2 pill po BID).  - Patient will be considered for GLP1 n therapy if he loses control.  - Patient specific target  for A1c; LDL, HDL, Triglycerides, and  Waist Circumference were discussed in detail.  2) BP/HTN: Controlled. Continue  current medications including ACEI/ARB. 3) Lipids/HPL:  continue statins. 4)  Weight/Diet: CDE consult in progress, exercise, and carbohydrates information provided.  5) Chronic Care/Health Maintenance:  -Patient is on ACEI/ARB and Statin medications and encouraged to continue to follow up with Ophthalmology, Podiatrist at least yearly or according to recommendations, and advised to stay away from smoking. I have recommended yearly flu vaccine and pneumonia vaccination at least every 5 years; moderate intensity exercise for up to 150 minutes weekly; and  sleep for at least 7 hours a day.  - 25 minutes of time was spent on the care of this patient , 50% of which was applied for counseling on diabetes complications and their preventions.  - I advised patient to maintain close follow up with Purvis Kilts, MD for primary care needs.  Patient is asked to bring meter and  blood glucose logs during their next visit.   Follow up plan: -Return in about 4 months (around 01/24/2016) for diabetes, high blood pressure, high cholesterol.  Glade Lloyd, MD Phone: [phone]  Fax: [phone]   09/24/2015, 8:55 AM

## 2015-09-24 NOTE — Patient Instructions (Signed)

## 2015-11-21 ENCOUNTER — Other Ambulatory Visit: Payer: Self-pay

## 2015-11-21 MED ORDER — NIFEDIPINE ER OSMOTIC RELEASE 30 MG PO TB24: 30.0000 mg | ORAL_TABLET | Freq: Every day | ORAL | Status: DC

## 2016-01-14 ENCOUNTER — Other Ambulatory Visit: Payer: Self-pay

## 2016-01-14 MED ORDER — SITAGLIPTIN PHOS-METFORMIN HCL 50-1000 MG PO TABS: ORAL_TABLET | ORAL | Status: DC

## 2016-01-19 ENCOUNTER — Other Ambulatory Visit: Payer: Self-pay | Admitting: "Endocrinology

## 2016-01-19 DIAGNOSIS — E1122 Type 2 diabetes mellitus with diabetic chronic kidney disease: Secondary | ICD-10-CM | POA: Diagnosis not present

## 2016-01-19 DIAGNOSIS — E785 Hyperlipidemia, unspecified: Secondary | ICD-10-CM | POA: Diagnosis not present

## 2016-01-19 DIAGNOSIS — N183 Chronic kidney disease, stage 3 (moderate): Secondary | ICD-10-CM | POA: Diagnosis not present

## 2016-01-19 LAB — BASIC METABOLIC PANEL
BUN: 18 mg/dL (ref 7–25)
CALCIUM: 8.7 mg/dL (ref 8.6–10.3)
CO2: 26 mmol/L (ref 20–31)
Chloride: 104 mmol/L (ref 98–110)
Creat: 1.18 mg/dL (ref 0.70–1.18)
GLUCOSE: 156 mg/dL — AB (ref 65–99)
POTASSIUM: 4.1 mmol/L (ref 3.5–5.3)
SODIUM: 140 mmol/L (ref 135–146)

## 2016-01-19 LAB — LIPID PANEL
CHOL/HDL RATIO: 2.9 ratio (ref <=5.0)
CHOLESTEROL: 145 mg/dL (ref 125–200)
HDL: 50 mg/dL (ref >=40)
LDL Cholesterol: 81 mg/dL (ref <130)
Triglycerides: 71 mg/dL (ref <150)
VLDL: 14 mg/dL (ref <30)

## 2016-01-19 LAB — HEMOGLOBIN A1C
Hgb A1c MFr Bld: 7.4 % — ABNORMAL HIGH (ref <5.7)
Mean Plasma Glucose: 166 mg/dL

## 2016-01-28 ENCOUNTER — Encounter: Payer: Self-pay | Admitting: "Endocrinology

## 2016-01-28 ENCOUNTER — Ambulatory Visit (INDEPENDENT_AMBULATORY_CARE_PROVIDER_SITE_OTHER): Payer: Medicare Other | Admitting: "Endocrinology

## 2016-01-28 VITALS — BP 147/87 | HR 80 | Ht 68.0 in | Wt 259.0 lb

## 2016-01-28 DIAGNOSIS — E1165 Type 2 diabetes mellitus with hyperglycemia: Secondary | ICD-10-CM

## 2016-01-28 DIAGNOSIS — E785 Hyperlipidemia, unspecified: Secondary | ICD-10-CM

## 2016-01-28 DIAGNOSIS — I10 Essential (primary) hypertension: Secondary | ICD-10-CM

## 2016-01-28 DIAGNOSIS — E118 Type 2 diabetes mellitus with unspecified complications: Secondary | ICD-10-CM

## 2016-01-28 DIAGNOSIS — IMO0002 Reserved for concepts with insufficient information to code with codable children: Secondary | ICD-10-CM

## 2016-01-28 NOTE — Progress Notes (Signed)
Subjective:    Patient ID: Kyle Hicks, male    DOB: 27-Mar-1941, PCP Kyle Kilts, MD   Past Medical History  Diagnosis Date  . Diabetes mellitus without complication ([place])   . HTN (hypertension)    History reviewed. No pertinent past surgical history. Social History   Social History  . Marital Status: Married    Spouse Name: N/A  . Number of Children: N/A  . Years of Education: N/A   Social History Main Topics  . Smoking status: Former Research scientist (life sciences)  . Smokeless tobacco: None  . Alcohol Use: No  . Drug Use: No  . Sexual Activity: Not Asked   Other Topics Concern  . None   Social History Narrative   Outpatient Encounter Prescriptions as of 01/28/2016  Medication Sig  . aspirin EC 81 MG tablet Take 81 mg by mouth daily.  Marland Kitchen atorvastatin (LIPITOR) 20 MG tablet Take 20 mg by mouth daily.  . hydrochlorothiazide (HYDRODIURIL) 25 MG tablet Take 25 mg by mouth daily.  Marland Kitchen NIFEdipine (NIFEDICAL XL) 30 MG 24 hr tablet Take 1 tablet (30 mg total) by mouth daily.  . ramipril (ALTACE) 10 MG capsule Once daily  . sitaGLIPtin-metformin (JANUMET) 50-1000 MG tablet ORAL two times a day  . [DISCONTINUED] Insulin Pen Needle 31G X 6 MM MISC 1 each by Does not apply route 4 (four) times daily.   No facility-administered encounter medications on file as of 01/28/2016.   ALLERGIES: No Known Allergies VACCINATION STATUS:  There is no immunization history on file for this patient.  Diabetes He presents for his follow-up diabetic visit. He has type 2 diabetes mellitus. The initial diagnosis of diabetes was made 2 years ago. His disease course has been worsening. There are no hypoglycemic associated symptoms. Pertinent negatives for hypoglycemia include no confusion, headaches, pallor or seizures. There are no diabetic associated symptoms. Pertinent negatives for diabetes include no chest pain, no fatigue, no polydipsia, no polyphagia, no polyuria and no weakness. There are no hypoglycemic  complications. Symptoms are worsening. Diabetic complications include nephropathy. Risk factors for coronary artery disease include diabetes mellitus, dyslipidemia, hypertension, male sex, obesity, sedentary lifestyle and tobacco exposure. He is compliant with treatment most of the time. His weight is stable. He never participates in exercise. An ACE inhibitor/angiotensin II receptor blocker is being taken.  Hypertension This is a chronic problem. The current episode started more than 1 year ago. The problem is controlled. Pertinent negatives include no chest pain, headaches, neck pain, palpitations or shortness of breath. Risk factors for coronary artery disease include diabetes mellitus, dyslipidemia, obesity, sedentary lifestyle and smoking/tobacco exposure.  Hyperlipidemia This is a chronic problem. The current episode started more than 1 year ago. Pertinent negatives include no chest pain, myalgias or shortness of breath. Current antihyperlipidemic treatment includes statins.     Review of Systems  Constitutional: Negative for fatigue and unexpected weight change.  HENT: Negative for dental problem, mouth sores and trouble swallowing.   Eyes: Negative for visual disturbance.  Respiratory: Negative for cough, choking, chest tightness, shortness of breath and wheezing.   Cardiovascular: Negative for chest pain, palpitations and leg swelling.  Gastrointestinal: Negative for nausea, vomiting, abdominal pain, diarrhea, constipation and abdominal distention.  Endocrine: Negative for polydipsia, polyphagia and polyuria.  Genitourinary: Negative for dysuria, urgency, hematuria and flank pain.  Musculoskeletal: Negative for myalgias, back pain, gait problem and neck pain.  Skin: Negative for pallor, rash and wound.  Neurological: Negative for seizures, syncope, weakness, numbness  and headaches.  Psychiatric/Behavioral: Negative.  Negative for confusion and dysphoric mood.    Objective:    BP  147/87 mmHg  Pulse 80  Ht 5\' 8"  (1.727 m)  Wt 259 lb (117.482 kg)  BMI 39.39 kg/m2  SpO2 96%  Wt Readings from Last 3 Encounters:  01/28/16 259 lb (117.482 kg)  09/24/15 257 lb (116.574 kg)  06/24/15 253 lb (114.76 kg)    Physical Exam  Constitutional: He is oriented to person, place, and time. He appears well-developed and well-nourished. He is cooperative. No distress.  HENT:  Head: Normocephalic and atraumatic.  Eyes: EOM are normal.  Neck: Normal range of motion. Neck supple. No tracheal deviation present. No thyromegaly present.  Cardiovascular: Normal rate, S1 normal, S2 normal and normal heart sounds.  Exam reveals no gallop.   No murmur heard. Pulses:      Dorsalis pedis pulses are 1+ on the right side, and 1+ on the left side.       Posterior tibial pulses are 1+ on the right side, and 1+ on the left side.  Pulmonary/Chest: Breath sounds normal. No respiratory distress. He has no wheezes.  Abdominal: Soft. Bowel sounds are normal. He exhibits no distension. There is no tenderness. There is no guarding and no CVA tenderness.  Musculoskeletal: He exhibits no edema.       Right shoulder: He exhibits no swelling and no deformity.  Neurological: He is alert and oriented to person, place, and time. He has normal strength and normal reflexes. No cranial nerve deficit or sensory deficit. Gait normal.  Skin: Skin is warm and dry. No rash noted. No cyanosis. Nails show no clubbing.  Psychiatric: He has a normal mood and affect. His speech is normal and behavior is normal. Judgment and thought content normal. Cognition and memory are normal.    Results for orders placed or performed in visit on [id number]  Basic metabolic panel  Result Value Ref Range   Sodium 140 135 - 146 mmol/L   Potassium 4.1 3.5 - 5.3 mmol/L   Chloride 104 98 - 110 mmol/L   CO2 26 20 - 31 mmol/L   Glucose, Bld 156 (H) 65 - 99 mg/dL   BUN 18 7 - 25 mg/dL   Creat 1.18 0.70 - 1.18 mg/dL   Calcium 8.7 8.6 - 10.3  mg/dL  Lipid panel  Result Value Ref Range   Cholesterol 145 125 - 200 mg/dL   Triglycerides 71 <150 mg/dL   HDL 50 >=40 mg/dL   Total CHOL/HDL Ratio 2.9 <=5.0 Ratio   VLDL 14 <30 mg/dL   LDL Cholesterol 81 <130 mg/dL  Hemoglobin A1c  Result Value Ref Range   Hgb A1c MFr Bld 7.4 (H) <5.7 %   Mean Plasma Glucose 166 mg/dL   Diabetic Labs (most recent): Lab Results  Component Value Date   HGBA1C 7.4* 01/19/2016   HGBA1C 6.7* 09/15/2015   HGBA1C 6.4* 06/21/2015   Lipid Panel     Component Value Date/Time   CHOL 145 01/19/2016 0719   TRIG 71 01/19/2016 0719   HDL 50 01/19/2016 0719   CHOLHDL 2.9 01/19/2016 0719   VLDL 14 01/19/2016 0719   LDLCALC 81 01/19/2016 0719     Assessment & Plan:   1. Type 2 DM - patient remains at a high risk for more acute and chronic complications of diabetes which include CAD, CVA, CKD, retinopathy, and neuropathy. These are all discussed in detail with the patient.  Patient came with  A1c of 7.4% increasing from 6.7 %.   Recent labs reviewed, showing normalized renal function.   - I have re-counseled the patient on diet management and weight loss  by adopting a carbohydrate restricted / protein rich  Diet.  - Suggestion is made for patient to avoid simple carbohydrates   from their diet including Cakes , Desserts, Ice Cream,  Soda (  diet and regular) , Sweet Tea , Candies,  Chips, Cookies, Artificial Sweeteners,   and "Sugar-free" Products .  This will help patient to have stable blood glucose profile and potentially avoid unintended  Weight gain.  - Patient is advised to stick to a routine mealtimes to eat 3 meals  a day and avoid unnecessary snacks ( to snack only to correct hypoglycemia).  - The patient  Will be  scheduled with Jearld Fenton, RDN, CDE for individualized DM education.  - I have approached patient with the following individualized plan to manage diabetes and patient agrees.  - I will increase Janumet 50/1000mg  1 pill po  BID.  - Patient will be considered for GLP1  therapy if he loses control.  - Patient specific target  for A1c; LDL, HDL, Triglycerides, and  Waist Circumference were discussed in detail.  2) BP/HTN: Controlled. Continue current medications including ACEI/ARB. 3) Lipids/HPL:  continue statins. 4)  Weight/Diet: CDE consult in progress, exercise, and carbohydrates information provided.  5) Chronic Care/Health Maintenance:  -Patient is on ACEI/ARB and Statin medications and encouraged to continue to follow up with Ophthalmology, Podiatrist at least yearly or according to recommendations, and advised to stay away from smoking. I have recommended yearly flu vaccine and pneumonia vaccination at least every 5 years; moderate intensity exercise for up to 150 minutes weekly; and  sleep for at least 7 hours a day.  - 25 minutes of time was spent on the care of this patient , 50% of which was applied for counseling on diabetes complications and their preventions.  - I advised patient to maintain close follow up with Kyle Kilts, MD for primary care needs.  Patient is asked to bring meter and  blood glucose logs during their next visit.   Follow up plan: -Return in about 3 months (around 04/29/2016) for follow up with pre-visit labs.  Glade Lloyd, MD Phone: [phone]  Fax: [phone]   01/28/2016, 8:43 AM

## 2016-03-01 ENCOUNTER — Other Ambulatory Visit: Payer: Self-pay

## 2016-03-01 MED ORDER — NIFEDIPINE ER OSMOTIC RELEASE 30 MG PO TB24: 30.0000 mg | ORAL_TABLET | Freq: Every day | ORAL | 2 refills | Status: DC

## 2016-04-02 ENCOUNTER — Other Ambulatory Visit: Payer: Self-pay | Admitting: "Endocrinology

## 2016-04-07 DIAGNOSIS — Z23 Encounter for immunization: Secondary | ICD-10-CM | POA: Diagnosis not present

## 2016-04-19 ENCOUNTER — Other Ambulatory Visit: Payer: Self-pay | Admitting: "Endocrinology

## 2016-04-26 ENCOUNTER — Other Ambulatory Visit: Payer: Self-pay | Admitting: "Endocrinology

## 2016-04-26 DIAGNOSIS — E1165 Type 2 diabetes mellitus with hyperglycemia: Secondary | ICD-10-CM | POA: Diagnosis not present

## 2016-04-26 DIAGNOSIS — Z6833 Body mass index (BMI) 33.0-33.9, adult: Secondary | ICD-10-CM | POA: Diagnosis not present

## 2016-04-26 DIAGNOSIS — Z0000 Encounter for general adult medical examination without abnormal findings: Secondary | ICD-10-CM | POA: Diagnosis not present

## 2016-04-26 DIAGNOSIS — Z1389 Encounter for screening for other disorder: Secondary | ICD-10-CM | POA: Diagnosis not present

## 2016-04-26 DIAGNOSIS — E118 Type 2 diabetes mellitus with unspecified complications: Secondary | ICD-10-CM | POA: Diagnosis not present

## 2016-04-26 DIAGNOSIS — I10 Essential (primary) hypertension: Secondary | ICD-10-CM | POA: Diagnosis not present

## 2016-04-26 DIAGNOSIS — E782 Mixed hyperlipidemia: Secondary | ICD-10-CM | POA: Diagnosis not present

## 2016-04-26 DIAGNOSIS — E785 Hyperlipidemia, unspecified: Secondary | ICD-10-CM | POA: Diagnosis not present

## 2016-04-26 LAB — COMPLETE METABOLIC PANEL WITH GFR
ALBUMIN: 4.1 g/dL (ref 3.6–5.1)
ALK PHOS: 53 U/L (ref 40–115)
ALT: 11 U/L (ref 9–46)
AST: 11 U/L (ref 10–35)
BILIRUBIN TOTAL: 0.4 mg/dL (ref 0.2–1.2)
BUN: 20 mg/dL (ref 7–25)
CALCIUM: 8.9 mg/dL (ref 8.6–10.3)
CO2: 22 mmol/L (ref 20–31)
Chloride: 104 mmol/L (ref 98–110)
Creat: 1.29 mg/dL — ABNORMAL HIGH (ref 0.70–1.18)
GFR, EST NON AFRICAN AMERICAN: 55 mL/min — AB (ref >=60)
GFR, Est African American: 63 mL/min (ref >=60)
GLUCOSE: 125 mg/dL — AB (ref 65–99)
Potassium: 3.7 mmol/L (ref 3.5–5.3)
SODIUM: 139 mmol/L (ref 135–146)
TOTAL PROTEIN: 6.5 g/dL (ref 6.1–8.1)

## 2016-04-27 LAB — HEMOGLOBIN A1C
HEMOGLOBIN A1C: 6.7 % — AB (ref <5.7)
Mean Plasma Glucose: 146 mg/dL

## 2016-05-03 ENCOUNTER — Ambulatory Visit (INDEPENDENT_AMBULATORY_CARE_PROVIDER_SITE_OTHER): Payer: Medicare Other | Admitting: "Endocrinology

## 2016-05-03 ENCOUNTER — Encounter: Payer: Self-pay | Admitting: "Endocrinology

## 2016-05-03 VITALS — BP 141/82 | HR 80 | Ht 68.0 in | Wt 259.0 lb

## 2016-05-03 DIAGNOSIS — I10 Essential (primary) hypertension: Secondary | ICD-10-CM | POA: Diagnosis not present

## 2016-05-03 DIAGNOSIS — IMO0002 Reserved for concepts with insufficient information to code with codable children: Secondary | ICD-10-CM

## 2016-05-03 DIAGNOSIS — E782 Mixed hyperlipidemia: Secondary | ICD-10-CM

## 2016-05-03 DIAGNOSIS — E1122 Type 2 diabetes mellitus with diabetic chronic kidney disease: Secondary | ICD-10-CM | POA: Diagnosis not present

## 2016-05-03 DIAGNOSIS — E1165 Type 2 diabetes mellitus with hyperglycemia: Secondary | ICD-10-CM

## 2016-05-03 DIAGNOSIS — N182 Chronic kidney disease, stage 2 (mild): Secondary | ICD-10-CM | POA: Diagnosis not present

## 2016-05-03 NOTE — Patient Instructions (Signed)

## 2016-05-03 NOTE — Progress Notes (Signed)
Subjective:    Patient ID: Kyle Hicks, male    DOB: [birth year], PCP Purvis Kilts, MD   Past Medical History:  Diagnosis Date  . Diabetes mellitus without complication ([place])   . HTN (hypertension)    History reviewed. No pertinent surgical history. Social History   Social History  . Marital status: Married    Spouse name: N/A  . Number of children: N/A  . Years of education: N/A   Social History Main Topics  . Smoking status: Former Research scientist (life sciences)  . Smokeless tobacco: Never Used  . Alcohol use No  . Drug use: No  . Sexual activity: Not Asked   Other Topics Concern  . None   Social History Narrative  . None   Outpatient Encounter Prescriptions as of 05/03/2016  Medication Sig  . aspirin EC 81 MG tablet Take 81 mg by mouth daily.  Marland Kitchen atorvastatin (LIPITOR) 20 MG tablet Take 20 mg by mouth daily.  . hydrochlorothiazide (HYDRODIURIL) 25 MG tablet Take 25 mg by mouth daily.  Marland Kitchen JANUMET 50-1000 MG tablet take 1 tablet by mouth twice a day  . NIFEdipine (NIFEDICAL XL) 30 MG 24 hr tablet Take 1 tablet (30 mg total) by mouth daily.  . ramipril (ALTACE) 10 MG capsule take 1 capsule by mouth once daily   No facility-administered encounter medications on file as of 05/03/2016.    ALLERGIES: No Known Allergies VACCINATION STATUS:  There is no immunization history on file for this patient.  Diabetes  He presents for his follow-up diabetic visit. He has type 2 diabetes mellitus. The initial diagnosis of diabetes was made 2 years ago. His disease course has been improving. There are no hypoglycemic associated symptoms. Pertinent negatives for hypoglycemia include no confusion, headaches, pallor or seizures. There are no diabetic associated symptoms. Pertinent negatives for diabetes include no chest pain, no fatigue, no polydipsia, no polyphagia, no polyuria and no weakness. There are no hypoglycemic complications. Symptoms are improving. Diabetic complications include  nephropathy. Risk factors for coronary artery disease include diabetes mellitus, dyslipidemia, hypertension, male sex, obesity, sedentary lifestyle and tobacco exposure. He is compliant with treatment most of the time. His weight is stable. He never participates in exercise. An ACE inhibitor/angiotensin II receptor blocker is being taken.  Hypertension  This is a chronic problem. The current episode started more than 1 year ago. The problem is controlled. Pertinent negatives include no chest pain, headaches, neck pain, palpitations or shortness of breath. Risk factors for coronary artery disease include diabetes mellitus, dyslipidemia, obesity, sedentary lifestyle and smoking/tobacco exposure.  Hyperlipidemia  This is a chronic problem. The current episode started more than 1 year ago. Pertinent negatives include no chest pain, myalgias or shortness of breath. Current antihyperlipidemic treatment includes statins.     Review of Systems  Constitutional: Negative for fatigue and unexpected weight change.  HENT: Negative for dental problem, mouth sores and trouble swallowing.   Eyes: Negative for visual disturbance.  Respiratory: Negative for cough, choking, chest tightness, shortness of breath and wheezing.   Cardiovascular: Negative for chest pain, palpitations and leg swelling.  Gastrointestinal: Negative for abdominal distention, abdominal pain, constipation, diarrhea, nausea and vomiting.  Endocrine: Negative for polydipsia, polyphagia and polyuria.  Genitourinary: Negative for dysuria, flank pain, hematuria and urgency.  Musculoskeletal: Negative for back pain, gait problem, myalgias and neck pain.  Skin: Negative for pallor, rash and wound.  Neurological: Negative for seizures, syncope, weakness, numbness and headaches.  Psychiatric/Behavioral: Negative.  Negative for  confusion and dysphoric mood.    Objective:    BP (!) 141/82   Pulse 80   Ht 5\' 8"  (1.727 m)   Wt 259 lb (117.5 kg)    BMI 39.38 kg/m   Wt Readings from Last 3 Encounters:  05/03/16 259 lb (117.5 kg)  01/28/16 259 lb (117.5 kg)  09/24/15 257 lb (116.6 kg)    Physical Exam  Constitutional: He is oriented to person, place, and time. He appears well-developed and well-nourished. He is cooperative. No distress.  HENT:  Head: Normocephalic and atraumatic.  Eyes: EOM are normal.  Neck: Normal range of motion. Neck supple. No tracheal deviation present. No thyromegaly present.  Cardiovascular: Normal rate, S1 normal, S2 normal and normal heart sounds.  Exam reveals no gallop.   No murmur heard. Pulses:      Dorsalis pedis pulses are 1+ on the right side, and 1+ on the left side.       Posterior tibial pulses are 1+ on the right side, and 1+ on the left side.  Pulmonary/Chest: Breath sounds normal. No respiratory distress. He has no wheezes.  Abdominal: Soft. Bowel sounds are normal. He exhibits no distension. There is no tenderness. There is no guarding and no CVA tenderness.  Musculoskeletal: He exhibits no edema.       Right shoulder: He exhibits no swelling and no deformity.  Neurological: He is alert and oriented to person, place, and time. He has normal strength and normal reflexes. No cranial nerve deficit or sensory deficit. Gait normal.  Skin: Skin is warm and dry. No rash noted. No cyanosis. Nails show no clubbing.  Psychiatric: He has a normal mood and affect. His speech is normal and behavior is normal. Judgment and thought content normal. Cognition and memory are normal.    Results for orders placed or performed in visit on 04/26/16  COMPLETE METABOLIC PANEL WITH GFR  Result Value Ref Range   Sodium 139 135 - 146 mmol/L   Potassium 3.7 3.5 - 5.3 mmol/L   Chloride 104 98 - 110 mmol/L   CO2 22 20 - 31 mmol/L   Glucose, Bld 125 (H) 65 - 99 mg/dL   BUN 20 7 - 25 mg/dL   Creat 1.29 (H) 0.70 - 1.18 mg/dL   Total Bilirubin 0.4 0.2 - 1.2 mg/dL   Alkaline Phosphatase 53 40 - 115 U/L   AST 11 10 -  35 U/L   ALT 11 9 - 46 U/L   Total Protein 6.5 6.1 - 8.1 g/dL   Albumin 4.1 3.6 - 5.1 g/dL   Calcium 8.9 8.6 - 10.3 mg/dL   GFR, Est African American 63 >=60 mL/min   GFR, Est Non African American 55 (L) >=60 mL/min  Hemoglobin A1c  Result Value Ref Range   Hgb A1c MFr Bld 6.7 (H) <5.7 %   Mean Plasma Glucose 146 mg/dL   Diabetic Labs (most recent): Lab Results  Component Value Date   HGBA1C 6.7 (H) 04/26/2016   HGBA1C 7.4 (H) 01/19/2016   HGBA1C 6.7 (H) 09/15/2015   Lipid Panel     Component Value Date/Time   CHOL 145 01/19/2016 0719   TRIG 71 01/19/2016 0719   HDL 50 01/19/2016 0719   CHOLHDL 2.9 01/19/2016 0719   VLDL 14 01/19/2016 0719   LDLCALC 81 01/19/2016 0719     Assessment & Plan:   1. 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 These are all discussed in detail with the patient.  Patient came with  A1c of 6.7% improving from 7.4%.   Recent labs reviewed, showing normalized renal function.   - I have re-counseled the patient on diet management and weight loss  by adopting a carbohydrate restricted / protein rich  Diet.  - Suggestion is made for patient to avoid simple carbohydrates   from their diet including Cakes , Desserts, Ice Cream,  Soda (  diet and regular) , Sweet Tea , Candies,  Chips, Cookies, Artificial Sweeteners,   and "Sugar-free" Products .  This will help patient to have stable blood glucose profile and potentially avoid unintended  Weight gain.  - Patient is advised to stick to a routine mealtimes to eat 3 meals  a day and avoid unnecessary snacks ( to snack only to correct hypoglycemia).  - The patient  Will be  scheduled with Jearld Fenton, RDN, CDE for individualized DM education.  - I have approached patient with the following individualized plan to manage diabetes and patient  agrees.  - I will continue Janumet 50/1000mg  1 pill po BID. - If his renal function does not improve by next visit, he will be considered for basal insulin therapy instead of metformin.  - Patient will be considered for GLP1  therapy if he loses control.  - Patient specific target  for A1c; LDL, HDL, Triglycerides, and  Waist Circumference were discussed in detail.  2) BP/HTN: Controlled. Continue current medications including ACEI/ARB. 3) Lipids/HPL:  continue statins. 4)  Weight/Diet: CDE consult in progress, exercise, and carbohydrates information provided.  5) Chronic Care/Health Maintenance:  -Patient is on ACEI/ARB and Statin medications and encouraged to continue to follow up with Ophthalmology, Podiatrist at least yearly or according to recommendations, and advised to stay away from smoking. I have recommended yearly flu vaccine and pneumonia vaccination at least every 5 years; moderate intensity exercise for up to 150 minutes weekly; and  sleep for at least 7 hours a day.  - 25 minutes of time was spent on the care of this patient , 50% of which was applied for counseling on diabetes complications and their preventions.  - I advised patient to maintain close follow up with Purvis Kilts, MD for primary care needs.  Patient is asked to bring meter and  blood glucose logs during their next visit.   Follow up plan: -Return in about 3 months (around 08/03/2016) for follow up with pre-visit labs.  Glade Lloyd, MD Phone: [phone]  Fax: [phone]   05/03/2016, 8:42 AM

## 2016-05-19 ENCOUNTER — Other Ambulatory Visit: Payer: Self-pay | Admitting: "Endocrinology

## 2016-05-19 NOTE — Telephone Encounter (Signed)
Do we provide procardia for this pt

## 2016-06-03 ENCOUNTER — Other Ambulatory Visit: Payer: Self-pay | Admitting: "Endocrinology

## 2016-07-18 ENCOUNTER — Other Ambulatory Visit: Payer: Self-pay | Admitting: "Endocrinology

## 2016-07-26 ENCOUNTER — Other Ambulatory Visit: Payer: Self-pay | Admitting: "Endocrinology

## 2016-07-28 ENCOUNTER — Other Ambulatory Visit: Payer: Self-pay | Admitting: "Endocrinology

## 2016-07-28 DIAGNOSIS — N182 Chronic kidney disease, stage 2 (mild): Secondary | ICD-10-CM | POA: Diagnosis not present

## 2016-07-28 DIAGNOSIS — E1165 Type 2 diabetes mellitus with hyperglycemia: Secondary | ICD-10-CM | POA: Diagnosis not present

## 2016-07-28 DIAGNOSIS — E1122 Type 2 diabetes mellitus with diabetic chronic kidney disease: Secondary | ICD-10-CM | POA: Diagnosis not present

## 2016-07-28 LAB — COMPREHENSIVE METABOLIC PANEL
ALBUMIN: 4.1 g/dL (ref 3.6–5.1)
ALT: 13 U/L (ref 9–46)
AST: 12 U/L (ref 10–35)
Alkaline Phosphatase: 53 U/L (ref 40–115)
BILIRUBIN TOTAL: 0.5 mg/dL (ref 0.2–1.2)
BUN: 19 mg/dL (ref 7–25)
CALCIUM: 8.6 mg/dL (ref 8.6–10.3)
CHLORIDE: 104 mmol/L (ref 98–110)
CO2: 23 mmol/L (ref 20–31)
CREATININE: 1.37 mg/dL — AB (ref 0.70–1.18)
Glucose, Bld: 135 mg/dL — ABNORMAL HIGH (ref 65–99)
Potassium: 3.8 mmol/L (ref 3.5–5.3)
SODIUM: 140 mmol/L (ref 135–146)
TOTAL PROTEIN: 7 g/dL (ref 6.1–8.1)

## 2016-07-28 LAB — HEMOGLOBIN A1C
Hgb A1c MFr Bld: 6.4 % — ABNORMAL HIGH (ref <5.7)
MEAN PLASMA GLUCOSE: 137 mg/dL

## 2016-08-03 ENCOUNTER — Encounter: Payer: Self-pay | Admitting: "Endocrinology

## 2016-08-03 ENCOUNTER — Ambulatory Visit (INDEPENDENT_AMBULATORY_CARE_PROVIDER_SITE_OTHER): Payer: Medicare Other | Admitting: "Endocrinology

## 2016-08-03 VITALS — BP 146/88 | HR 86 | Ht 68.0 in | Wt 263.0 lb

## 2016-08-03 DIAGNOSIS — E1122 Type 2 diabetes mellitus with diabetic chronic kidney disease: Secondary | ICD-10-CM

## 2016-08-03 DIAGNOSIS — I10 Essential (primary) hypertension: Secondary | ICD-10-CM | POA: Diagnosis not present

## 2016-08-03 DIAGNOSIS — N182 Chronic kidney disease, stage 2 (mild): Secondary | ICD-10-CM

## 2016-08-03 DIAGNOSIS — E1165 Type 2 diabetes mellitus with hyperglycemia: Secondary | ICD-10-CM

## 2016-08-03 DIAGNOSIS — E782 Mixed hyperlipidemia: Secondary | ICD-10-CM

## 2016-08-03 DIAGNOSIS — IMO0002 Reserved for concepts with insufficient information to code with codable children: Secondary | ICD-10-CM

## 2016-08-03 NOTE — Progress Notes (Signed)
Subjective:    Patient ID: Kyle Hicks, male    DOB: 03/06/1941, PCP Purvis Kilts, MD   Past Medical History:  Diagnosis Date  . Diabetes mellitus without complication ([place])   . HTN (hypertension)    History reviewed. No pertinent surgical history. Social History   Social History  . Marital status: Married    Spouse name: N/A  . Number of children: N/A  . Years of education: N/A   Social History Main Topics  . Smoking status: Former Research scientist (life sciences)  . Smokeless tobacco: Never Used  . Alcohol use No  . Drug use: No  . Sexual activity: Not Asked   Other Topics Concern  . None   Social History Narrative  . None   Outpatient Encounter Prescriptions as of 08/03/2016  Medication Sig  . aspirin EC 81 MG tablet Take 81 mg by mouth daily.  Marland Kitchen atorvastatin (LIPITOR) 20 MG tablet Take 20 mg by mouth daily.  . hydrochlorothiazide (HYDRODIURIL) 25 MG tablet Take 25 mg by mouth daily.  Marland Kitchen JANUMET 50-1000 MG tablet take 1 tablet twice a day  . NIFEdipine (PROCARDIA-XL/ADALAT-CC/NIFEDICAL-XL) 30 MG 24 hr tablet take 1 tablet by mouth once daily  . ramipril (ALTACE) 10 MG capsule take 1 capsule once daily   No facility-administered encounter medications on file as of 08/03/2016.    ALLERGIES: No Known Allergies VACCINATION STATUS:  There is no immunization history on file for this patient.  Diabetes  He presents for his follow-up diabetic visit. He has type 2 diabetes mellitus. The initial diagnosis of diabetes was made 2 years ago. His disease course has been improving. There are no hypoglycemic associated symptoms. Pertinent negatives for hypoglycemia include no confusion, headaches, pallor or seizures. There are no diabetic associated symptoms. Pertinent negatives for diabetes include no chest pain, no fatigue, no polydipsia, no polyphagia, no polyuria and no weakness. There are no hypoglycemic complications. Symptoms are improving. Diabetic complications include nephropathy. Risk  factors for coronary artery disease include diabetes mellitus, dyslipidemia, hypertension, male sex, obesity, sedentary lifestyle and tobacco exposure. He is compliant with treatment most of the time. His weight is stable. He never participates in exercise. An ACE inhibitor/angiotensin II receptor blocker is being taken.  Hypertension  This is a chronic problem. The current episode started more than 1 year ago. The problem is controlled. Pertinent negatives include no chest pain, headaches, neck pain, palpitations or shortness of breath. Risk factors for coronary artery disease include diabetes mellitus, dyslipidemia, obesity, sedentary lifestyle and smoking/tobacco exposure.  Hyperlipidemia  This is a chronic problem. The current episode started more than 1 year ago. Pertinent negatives include no chest pain, myalgias or shortness of breath. Current antihyperlipidemic treatment includes statins.     Review of Systems  Constitutional: Negative for fatigue and unexpected weight change.  HENT: Negative for dental problem, mouth sores and trouble swallowing.   Eyes: Negative for visual disturbance.  Respiratory: Negative for cough, choking, chest tightness, shortness of breath and wheezing.   Cardiovascular: Negative for chest pain, palpitations and leg swelling.  Gastrointestinal: Negative for abdominal distention, abdominal pain, constipation, diarrhea, nausea and vomiting.  Endocrine: Negative for polydipsia, polyphagia and polyuria.  Genitourinary: Negative for dysuria, flank pain, hematuria and urgency.  Musculoskeletal: Negative for back pain, gait problem, myalgias and neck pain.  Skin: Negative for pallor, rash and wound.  Neurological: Negative for seizures, syncope, weakness, numbness and headaches.  Psychiatric/Behavioral: Negative.  Negative for confusion and dysphoric mood.  Objective:    BP (!) 146/88   Pulse 86   Ht 5\' 8"  (1.727 m)   Wt 263 lb (119.3 kg)   BMI 39.99 kg/m    Wt Readings from Last 3 Encounters:  08/03/16 263 lb (119.3 kg)  05/03/16 259 lb (117.5 kg)  01/28/16 259 lb (117.5 kg)    Physical Exam  Constitutional: He is oriented to person, place, and time. He appears well-developed and well-nourished. He is cooperative. No distress.  HENT:  Head: Normocephalic and atraumatic.  Eyes: EOM are normal.  Neck: Normal range of motion. Neck supple. No tracheal deviation present. No thyromegaly present.  Cardiovascular: Normal rate, S1 normal, S2 normal and normal heart sounds.  Exam reveals no gallop.   No murmur heard. Pulses:      Dorsalis pedis pulses are 1+ on the right side, and 1+ on the left side.       Posterior tibial pulses are 1+ on the right side, and 1+ on the left side.  Pulmonary/Chest: Breath sounds normal. No respiratory distress. He has no wheezes.  Abdominal: Soft. Bowel sounds are normal. He exhibits no distension. There is no tenderness. There is no guarding and no CVA tenderness.  Musculoskeletal: He exhibits no edema.       Right shoulder: He exhibits no swelling and no deformity.  Neurological: He is alert and oriented to person, place, and time. He has normal strength and normal reflexes. No cranial nerve deficit or sensory deficit. Gait normal.  Skin: Skin is warm and dry. No rash noted. No cyanosis. Nails show no clubbing.  Psychiatric: He has a normal mood and affect. His speech is normal and behavior is normal. Judgment and thought content normal. Cognition and memory are normal.    Results for orders placed or performed in visit on 07/28/16  Comprehensive metabolic panel  Result Value Ref Range   Sodium 140 135 - 146 mmol/L   Potassium 3.8 3.5 - 5.3 mmol/L   Chloride 104 98 - 110 mmol/L   CO2 23 20 - 31 mmol/L   Glucose, Bld 135 (H) 65 - 99 mg/dL   BUN 19 7 - 25 mg/dL   Creat 1.37 (H) 0.70 - 1.18 mg/dL   Total Bilirubin 0.5 0.2 - 1.2 mg/dL   Alkaline Phosphatase 53 40 - 115 U/L   AST 12 10 - 35 U/L   ALT 13 9 -  46 U/L   Total Protein 7.0 6.1 - 8.1 g/dL   Albumin 4.1 3.6 - 5.1 g/dL   Calcium 8.6 8.6 - 10.3 mg/dL  Hemoglobin A1c  Result Value Ref Range   Hgb A1c MFr Bld 6.4 (H) <5.7 %   Mean Plasma Glucose 137 mg/dL   Diabetic Labs (most recent): Lab Results  Component Value Date   HGBA1C 6.4 (H) 07/28/2016   HGBA1C 6.7 (H) 04/26/2016   HGBA1C 7.4 (H) 01/19/2016   Lipid Panel     Component Value Date/Time   CHOL 145 01/19/2016 0719   TRIG 71 01/19/2016 0719   HDL 50 01/19/2016 0719   CHOLHDL 2.9 01/19/2016 0719   VLDL 14 01/19/2016 0719   LDLCALC 81 01/19/2016 0719     Assessment & Plan:   1. 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 These are all discussed in detail with the patient.  Patient came with  A1c of 6.4% improving from 7.4%.   Recent labs reviewed, showing increasing scr.   - I have re-counseled the patient on diet management and weight loss  by adopting a carbohydrate restricted / protein rich  Diet.  - Suggestion is made for patient to avoid simple carbohydrates   from their diet including Cakes , Desserts, Ice Cream,  Soda (  diet and regular) , Sweet Tea , Candies,  Chips, Cookies, Artificial Sweeteners,   and "Sugar-free" Products .  This will help patient to have stable blood glucose profile and potentially avoid unintended  Weight gain.  - Patient is advised to stick to a routine mealtimes to eat 3 meals  a day and avoid unnecessary snacks ( to snack only to correct hypoglycemia).  - The patient  Will be  scheduled with Jearld Fenton, RDN, CDE for individualized DM education.  - I have approached patient with the following individualized plan to manage diabetes and patient agrees.  - I will decrease dose of  Janumet 50/1000mg   To 1/2 pill po BID. - If his renal function does not improve by next visit, he  will be considered for basal insulin therapy instead of metformin.  - Patient will be considered for GLP1  therapy if he loses control.  - Patient specific target  for A1c; LDL, HDL, Triglycerides, and  Waist Circumference were discussed in detail.  2) BP/HTN: Controlled. Continue current medications including ACEI/ARB. 3) Lipids/HPL:  continue statins. 4)  Weight/Diet: CDE consult in progress, exercise, and carbohydrates information provided.  5) Chronic Care/Health Maintenance:  -Patient is on ACEI/ARB and Statin medications and encouraged to continue to follow up with Ophthalmology, Podiatrist at least yearly or according to recommendations, and advised to stay away from smoking. I have recommended yearly flu vaccine and pneumonia vaccination at least every 5 years; moderate intensity exercise for up to 150 minutes weekly; and  sleep for at least 7 hours a day.  - 25 minutes of time was spent on the care of this patient , 50% of which was applied for counseling on diabetes complications and their preventions.  - I advised patient to maintain close follow up with Purvis Kilts, MD for primary care needs.  Patient is asked to bring meter and  blood glucose logs during their next visit.   Follow up plan: -Return in about 3 months (around 11/01/2016) for follow up with pre-visit labs.  Glade Lloyd, MD Phone: [phone]  Fax: [phone]   08/03/2016, 8:42 AM

## 2016-08-21 ENCOUNTER — Other Ambulatory Visit: Payer: Self-pay | Admitting: "Endocrinology

## 2016-09-23 ENCOUNTER — Other Ambulatory Visit: Payer: Self-pay | Admitting: "Endocrinology

## 2016-10-26 ENCOUNTER — Other Ambulatory Visit: Payer: Self-pay | Admitting: "Endocrinology

## 2016-10-26 DIAGNOSIS — E1165 Type 2 diabetes mellitus with hyperglycemia: Secondary | ICD-10-CM | POA: Diagnosis not present

## 2016-10-26 DIAGNOSIS — E1122 Type 2 diabetes mellitus with diabetic chronic kidney disease: Secondary | ICD-10-CM | POA: Diagnosis not present

## 2016-10-26 DIAGNOSIS — N182 Chronic kidney disease, stage 2 (mild): Secondary | ICD-10-CM | POA: Diagnosis not present

## 2016-10-26 LAB — COMPREHENSIVE METABOLIC PANEL
ALT: 16 U/L (ref 9–46)
AST: 15 U/L (ref 10–35)
Albumin: 3.9 g/dL (ref 3.6–5.1)
Alkaline Phosphatase: 57 U/L (ref 40–115)
BUN: 27 mg/dL — AB (ref 7–25)
CHLORIDE: 106 mmol/L (ref 98–110)
CO2: 22 mmol/L (ref 20–31)
Calcium: 9 mg/dL (ref 8.6–10.3)
Creat: 1.61 mg/dL — ABNORMAL HIGH (ref 0.70–1.18)
GLUCOSE: 140 mg/dL — AB (ref 65–99)
POTASSIUM: 3.9 mmol/L (ref 3.5–5.3)
Sodium: 142 mmol/L (ref 135–146)
TOTAL PROTEIN: 6.9 g/dL (ref 6.1–8.1)
Total Bilirubin: 0.5 mg/dL (ref 0.2–1.2)

## 2016-10-27 LAB — HEMOGLOBIN A1C
HEMOGLOBIN A1C: 6.9 % — AB (ref <5.7)
MEAN PLASMA GLUCOSE: 151 mg/dL

## 2016-11-03 ENCOUNTER — Encounter: Payer: Self-pay | Admitting: "Endocrinology

## 2016-11-03 ENCOUNTER — Ambulatory Visit (INDEPENDENT_AMBULATORY_CARE_PROVIDER_SITE_OTHER): Payer: Medicare Other | Admitting: "Endocrinology

## 2016-11-03 VITALS — BP 138/82 | HR 86 | Ht 68.0 in | Wt 266.0 lb

## 2016-11-03 DIAGNOSIS — E1122 Type 2 diabetes mellitus with diabetic chronic kidney disease: Secondary | ICD-10-CM | POA: Diagnosis not present

## 2016-11-03 DIAGNOSIS — E782 Mixed hyperlipidemia: Secondary | ICD-10-CM | POA: Diagnosis not present

## 2016-11-03 DIAGNOSIS — N182 Chronic kidney disease, stage 2 (mild): Secondary | ICD-10-CM

## 2016-11-03 DIAGNOSIS — E1165 Type 2 diabetes mellitus with hyperglycemia: Secondary | ICD-10-CM | POA: Diagnosis not present

## 2016-11-03 DIAGNOSIS — I10 Essential (primary) hypertension: Secondary | ICD-10-CM | POA: Diagnosis not present

## 2016-11-03 DIAGNOSIS — IMO0002 Reserved for concepts with insufficient information to code with codable children: Secondary | ICD-10-CM

## 2016-11-03 NOTE — Patient Instructions (Signed)

## 2016-11-03 NOTE — Progress Notes (Signed)
Subjective:    Patient ID: Kyle Hicks, male    DOB: July 01, 1941, PCP Purvis Kilts, MD   Past Medical History:  Diagnosis Date  . Diabetes mellitus without complication ([place])   . HTN (hypertension)    History reviewed. No pertinent surgical history. Social History   Social History  . Marital status: Married    Spouse name: N/A  . Number of children: N/A  . Years of education: N/A   Social History Main Topics  . Smoking status: Former Research scientist (life sciences)  . Smokeless tobacco: Never Used  . Alcohol use No  . Drug use: No  . Sexual activity: Not Asked   Other Topics Concern  . None   Social History Narrative  . None   Outpatient Encounter Prescriptions as of 11/03/2016  Medication Sig  . aspirin EC 81 MG tablet Take 81 mg by mouth daily.  Marland Kitchen atorvastatin (LIPITOR) 20 MG tablet Take 20 mg by mouth daily.  . hydrochlorothiazide (HYDRODIURIL) 25 MG tablet Take 25 mg by mouth daily.  Marland Kitchen JANUMET 50-1000 MG tablet take 1 tablet twice a day  . NIFEdipine (PROCARDIA-XL/ADALAT-CC/NIFEDICAL-XL) 30 MG 24 hr tablet take 1 tablet by mouth once daily  . ramipril (ALTACE) 10 MG capsule take 1 capsule by mouth once daily   No facility-administered encounter medications on file as of 11/03/2016.    ALLERGIES: No Known Allergies VACCINATION STATUS:  There is no immunization history on file for this patient.  Diabetes  He presents for his follow-up diabetic visit. He has type 2 diabetes mellitus. The initial diagnosis of diabetes was made 2 years ago. His disease course has been improving. There are no hypoglycemic associated symptoms. Pertinent negatives for hypoglycemia include no confusion, headaches, pallor or seizures. There are no diabetic associated symptoms. Pertinent negatives for diabetes include no chest pain, no fatigue, no polydipsia, no polyphagia, no polyuria and no weakness. There are no hypoglycemic complications. Symptoms are improving. Diabetic complications include  nephropathy. Risk factors for coronary artery disease include diabetes mellitus, dyslipidemia, hypertension, male sex, obesity, sedentary lifestyle and tobacco exposure. He is compliant with treatment most of the time. His weight is increasing steadily. He never participates in exercise. An ACE inhibitor/angiotensin II receptor blocker is being taken.  Hypertension  This is a chronic problem. The current episode started more than 1 year ago. The problem is controlled. Pertinent negatives include no chest pain, headaches, neck pain, palpitations or shortness of breath. Risk factors for coronary artery disease include diabetes mellitus, dyslipidemia, obesity, sedentary lifestyle and smoking/tobacco exposure.  Hyperlipidemia  This is a chronic problem. The current episode started more than 1 year ago. Pertinent negatives include no chest pain, myalgias or shortness of breath. Current antihyperlipidemic treatment includes statins.     Review of Systems  Constitutional: Negative for fatigue and unexpected weight change.  HENT: Negative for dental problem, mouth sores and trouble swallowing.   Eyes: Negative for visual disturbance.  Respiratory: Negative for cough, choking, chest tightness, shortness of breath and wheezing.   Cardiovascular: Negative for chest pain, palpitations and leg swelling.  Gastrointestinal: Negative for abdominal distention, abdominal pain, constipation, diarrhea, nausea and vomiting.  Endocrine: Negative for polydipsia, polyphagia and polyuria.  Genitourinary: Negative for dysuria, flank pain, hematuria and urgency.  Musculoskeletal: Negative for back pain, gait problem, myalgias and neck pain.  Skin: Negative for pallor, rash and wound.  Neurological: Negative for seizures, syncope, weakness, numbness and headaches.  Psychiatric/Behavioral: Negative.  Negative for confusion and dysphoric mood.  Objective:    BP 138/82   Pulse 86   Ht 5\' 8"  (1.727 m)   Wt 266 lb  (120.7 kg)   BMI 40.45 kg/m   Wt Readings from Last 3 Encounters:  11/03/16 266 lb (120.7 kg)  08/03/16 263 lb (119.3 kg)  05/03/16 259 lb (117.5 kg)    Physical Exam  Constitutional: He is oriented to person, place, and time. He appears well-developed and well-nourished. He is cooperative. No distress.  HENT:  Head: Normocephalic and atraumatic.  Eyes: EOM are normal.  Neck: Normal range of motion. Neck supple. No tracheal deviation present. No thyromegaly present.  Cardiovascular: Normal rate, S1 normal, S2 normal and normal heart sounds.  Exam reveals no gallop.   No murmur heard. Pulses:      Dorsalis pedis pulses are 1+ on the right side, and 1+ on the left side.       Posterior tibial pulses are 1+ on the right side, and 1+ on the left side.  Pulmonary/Chest: Breath sounds normal. No respiratory distress. He has no wheezes.  Abdominal: Soft. Bowel sounds are normal. He exhibits no distension. There is no tenderness. There is no guarding and no CVA tenderness.  Musculoskeletal: He exhibits no edema.       Right shoulder: He exhibits no swelling and no deformity.  Neurological: He is alert and oriented to person, place, and time. He has normal strength and normal reflexes. No cranial nerve deficit or sensory deficit. Gait normal.  Skin: Skin is warm and dry. No rash noted. No cyanosis. Nails show no clubbing.  Psychiatric: He has a normal mood and affect. His speech is normal and behavior is normal. Judgment and thought content normal. Cognition and memory are normal.    Results for orders placed or performed in visit on 10/26/16  Comprehensive metabolic panel  Result Value Ref Range   Sodium 142 135 - 146 mmol/L   Potassium 3.9 3.5 - 5.3 mmol/L   Chloride 106 98 - 110 mmol/L   CO2 22 20 - 31 mmol/L   Glucose, Bld 140 (H) 65 - 99 mg/dL   BUN 27 (H) 7 - 25 mg/dL   Creat 1.61 (H) 0.70 - 1.18 mg/dL   Total Bilirubin 0.5 0.2 - 1.2 mg/dL   Alkaline Phosphatase 57 40 - 115 U/L    AST 15 10 - 35 U/L   ALT 16 9 - 46 U/L   Total Protein 6.9 6.1 - 8.1 g/dL   Albumin 3.9 3.6 - 5.1 g/dL   Calcium 9.0 8.6 - 10.3 mg/dL  Hemoglobin A1c  Result Value Ref Range   Hgb A1c MFr Bld 6.9 (H) <5.7 %   Mean Plasma Glucose 151 mg/dL   Diabetic Labs (most recent): Lab Results  Component Value Date   HGBA1C 6.9 (H) 10/26/2016   HGBA1C 6.4 (H) 07/28/2016   HGBA1C 6.7 (H) 04/26/2016   Lipid Panel     Component Value Date/Time   CHOL 145 01/19/2016 0719   TRIG 71 01/19/2016 0719   HDL 50 01/19/2016 0719   CHOLHDL 2.9 01/19/2016 0719   VLDL 14 01/19/2016 0719   LDLCALC 81 01/19/2016 0719     Assessment & Plan:   1. 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 These are all discussed in detail with the patient.  Patient came with  A1c of 6.9% , generally improving from 7.4%.   Recent labs reviewed, showing increasing scr.   - I have re-counseled the patient on diet management and weight loss  by adopting a carbohydrate restricted / protein rich  Diet.  - Suggestion is made for patient to avoid simple carbohydrates   from their diet including Cakes , Desserts, Ice Cream,  Soda (  diet and regular) , Sweet Tea , Candies,  Chips, Cookies, Artificial Sweeteners,   and "Sugar-free" Products .  This will help patient to have stable blood glucose profile and potentially avoid unintended  Weight gain.  - Patient is advised to stick to a routine mealtimes to eat 3 meals  a day and avoid unnecessary snacks ( to snack only to correct hypoglycemia).  - The patient  Will be  scheduled with Jearld Fenton, RDN, CDE for individualized DM education.  - I have approached patient with the following individualized plan to manage diabetes and patient agrees.  - I will continue low dose of  Janumet 50/1000 mg ( 1/2 pil)  po BID. - If his  renal function does not improve by next visit, he will be considered for basal insulin therapy instead of metformin.  - Patient will be considered for GLP1  therapy if he loses control.  - Patient specific target  for A1c; LDL, HDL, Triglycerides, and  Waist Circumference were discussed in detail.  2) BP/HTN: Controlled. Continue current medications including ACEI/ARB. 3) Lipids/HPL:  continue statins. 4)  Weight/Diet: CDE consult in progress, exercise, and carbohydrates information provided.  5) Chronic Care/Health Maintenance:  -Patient is on ACEI/ARB and Statin medications and encouraged to continue to follow up with Ophthalmology, Podiatrist at least yearly or according to recommendations, and advised to stay away from smoking. I have recommended yearly flu vaccine and pneumonia vaccination at least every 5 years; moderate intensity exercise for up to 150 minutes weekly; and  sleep for at least 7 hours a day.  - 25 minutes of time was spent on the care of this patient , 50% of which was applied for counseling on diabetes complications and their preventions.  - I advised patient to maintain close follow up with Purvis Kilts, MD for primary care needs.  Patient is asked to bring meter and  blood glucose logs during their next visit.   Follow up plan: -Return in about 4 months (around 03/05/2017) for follow up with pre-visit labs.  Glade Lloyd, MD Phone: [phone]  Fax: [phone]   11/03/2016, 8:45 AM

## 2016-11-19 ENCOUNTER — Other Ambulatory Visit: Payer: Self-pay | Admitting: "Endocrinology

## 2017-01-10 ENCOUNTER — Telehealth: Payer: Self-pay | Admitting: "Endocrinology

## 2017-01-10 MED ORDER — SITAGLIPTIN PHOS-METFORMIN HCL 50-1000 MG PO TABS: 0.5000 | ORAL_TABLET | Freq: Two times a day (BID) | ORAL | 2 refills | Status: DC

## 2017-01-10 NOTE — Telephone Encounter (Signed)
The Pharmacist at [facility] is asking for new Rx instructions for [facility] JANUMET 50-1000 MG tablet, please advise

## 2017-02-13 ENCOUNTER — Other Ambulatory Visit: Payer: Self-pay | Admitting: "Endocrinology

## 2017-02-21 ENCOUNTER — Other Ambulatory Visit: Payer: Self-pay | Admitting: "Endocrinology

## 2017-03-07 ENCOUNTER — Other Ambulatory Visit: Payer: Self-pay | Admitting: "Endocrinology

## 2017-03-07 DIAGNOSIS — N182 Chronic kidney disease, stage 2 (mild): Secondary | ICD-10-CM | POA: Diagnosis not present

## 2017-03-07 DIAGNOSIS — E1122 Type 2 diabetes mellitus with diabetic chronic kidney disease: Secondary | ICD-10-CM | POA: Diagnosis not present

## 2017-03-07 DIAGNOSIS — E1165 Type 2 diabetes mellitus with hyperglycemia: Secondary | ICD-10-CM | POA: Diagnosis not present

## 2017-03-07 LAB — COMPREHENSIVE METABOLIC PANEL
ALT: 15 U/L (ref 9–46)
AST: 11 U/L (ref 10–35)
Albumin: 4.1 g/dL (ref 3.6–5.1)
Alkaline Phosphatase: 72 U/L (ref 40–115)
BUN: 21 mg/dL (ref 7–25)
CHLORIDE: 104 mmol/L (ref 98–110)
CO2: 24 mmol/L (ref 20–32)
CREATININE: 1.39 mg/dL — AB (ref 0.70–1.18)
Calcium: 9.1 mg/dL (ref 8.6–10.3)
Glucose, Bld: 138 mg/dL — ABNORMAL HIGH (ref 65–99)
POTASSIUM: 3.9 mmol/L (ref 3.5–5.3)
SODIUM: 139 mmol/L (ref 135–146)
Total Bilirubin: 0.4 mg/dL (ref 0.2–1.2)
Total Protein: 7 g/dL (ref 6.1–8.1)

## 2017-03-08 LAB — HEMOGLOBIN A1C
Hgb A1c MFr Bld: 7.9 % — ABNORMAL HIGH (ref <5.7)
MEAN PLASMA GLUCOSE: 180 mg/dL

## 2017-03-14 ENCOUNTER — Encounter: Payer: Self-pay | Admitting: "Endocrinology

## 2017-03-14 ENCOUNTER — Ambulatory Visit (INDEPENDENT_AMBULATORY_CARE_PROVIDER_SITE_OTHER): Payer: Medicare Other | Admitting: "Endocrinology

## 2017-03-14 VITALS — BP 154/91 | HR 90 | Ht 68.0 in | Wt 263.0 lb

## 2017-03-14 DIAGNOSIS — N182 Chronic kidney disease, stage 2 (mild): Secondary | ICD-10-CM

## 2017-03-14 DIAGNOSIS — I10 Essential (primary) hypertension: Secondary | ICD-10-CM | POA: Diagnosis not present

## 2017-03-14 DIAGNOSIS — E1165 Type 2 diabetes mellitus with hyperglycemia: Secondary | ICD-10-CM

## 2017-03-14 DIAGNOSIS — E1122 Type 2 diabetes mellitus with diabetic chronic kidney disease: Secondary | ICD-10-CM

## 2017-03-14 DIAGNOSIS — E782 Mixed hyperlipidemia: Secondary | ICD-10-CM

## 2017-03-14 DIAGNOSIS — IMO0002 Reserved for concepts with insufficient information to code with codable children: Secondary | ICD-10-CM

## 2017-03-14 NOTE — Patient Instructions (Signed)

## 2017-03-14 NOTE — Progress Notes (Signed)
Subjective:    Patient ID: Kyle Hicks, male    DOB: 1941-06-05, PCP Sharilyn Sites, MD   Past Medical History:  Diagnosis Date  . Diabetes mellitus without complication ([place])   . HTN (hypertension)    History reviewed. No pertinent surgical history. Social History   Social History  . Marital status: Married    Spouse name: N/A  . Number of children: N/A  . Years of education: N/A   Social History Main Topics  . Smoking status: Former Research scientist (life sciences)  . Smokeless tobacco: Never Used  . Alcohol use No  . Drug use: No  . Sexual activity: Not Asked   Other Topics Concern  . None   Social History Narrative  . None   Outpatient Encounter Prescriptions as of 03/14/2017  Medication Sig  . aspirin EC 81 MG tablet Take 81 mg by mouth daily.  Marland Kitchen atorvastatin (LIPITOR) 20 MG tablet Take 20 mg by mouth daily.  . hydrochlorothiazide (HYDRODIURIL) 25 MG tablet Take 25 mg by mouth daily.  Marland Kitchen NIFEdipine (PROCARDIA-XL/ADALAT-CC/NIFEDICAL-XL) 30 MG 24 hr tablet take 1 tablet by mouth once daily  . ramipril (ALTACE) 10 MG capsule take 1 capsule by mouth once daily  . sitaGLIPtin-metformin (JANUMET) 50-1000 MG tablet Take 0.5 tablets by mouth 2 (two) times daily.   No facility-administered encounter medications on file as of 03/14/2017.    ALLERGIES: No Known Allergies VACCINATION STATUS:  There is no immunization history on file for this patient.  Diabetes  He presents for his follow-up diabetic visit. He has type 2 diabetes mellitus. The initial diagnosis of diabetes was made 2 years ago. His disease course has been worsening. There are no hypoglycemic associated symptoms. Pertinent negatives for hypoglycemia include no confusion, headaches, pallor or seizures. There are no diabetic associated symptoms. Pertinent negatives for diabetes include no chest pain, no fatigue, no polydipsia, no polyphagia, no polyuria and no weakness. There are no hypoglycemic complications. Symptoms are  worsening. Diabetic complications include nephropathy. Risk factors for coronary artery disease include diabetes mellitus, dyslipidemia, hypertension, male sex, obesity, sedentary lifestyle and tobacco exposure. He is compliant with treatment most of the time. His weight is stable. He has had a previous visit with a dietitian. He never participates in exercise. An ACE inhibitor/angiotensin II receptor blocker is being taken.  Hypertension  This is a chronic problem. The current episode started more than 1 year ago. The problem is uncontrolled. Pertinent negatives include no chest pain, headaches, neck pain, palpitations or shortness of breath. Risk factors for coronary artery disease include diabetes mellitus, dyslipidemia, obesity, sedentary lifestyle and smoking/tobacco exposure.  Hyperlipidemia  This is a chronic problem. The current episode started more than 1 year ago. Pertinent negatives include no chest pain, myalgias or shortness of breath. Current antihyperlipidemic treatment includes statins.     Review of Systems  Constitutional: Negative for fatigue and unexpected weight change.  HENT: Negative for dental problem, mouth sores and trouble swallowing.   Eyes: Negative for visual disturbance.  Respiratory: Negative for cough, choking, chest tightness, shortness of breath and wheezing.   Cardiovascular: Negative for chest pain, palpitations and leg swelling.  Gastrointestinal: Negative for abdominal distention, abdominal pain, constipation, diarrhea, nausea and vomiting.  Endocrine: Negative for polydipsia, polyphagia and polyuria.  Genitourinary: Negative for dysuria, flank pain, hematuria and urgency.  Musculoskeletal: Negative for back pain, gait problem, myalgias and neck pain.  Skin: Negative for pallor, rash and wound.  Neurological: Negative for seizures, syncope, weakness, numbness and  headaches.  Psychiatric/Behavioral: Negative.  Negative for confusion and dysphoric mood.     Objective:    BP (!) 154/91   Pulse 90   Ht 5\' 8"  (1.727 m)   Wt 263 lb (119.3 kg)   BMI 39.99 kg/m   Wt Readings from Last 3 Encounters:  03/14/17 263 lb (119.3 kg)  11/03/16 266 lb (120.7 kg)  08/03/16 263 lb (119.3 kg)    Physical Exam  Constitutional: He is oriented to person, place, and time. He appears well-developed and well-nourished. He is cooperative. No distress.  HENT:  Head: Normocephalic and atraumatic.  Eyes: EOM are normal.  Neck: Normal range of motion. Neck supple. No tracheal deviation present. No thyromegaly present.  Cardiovascular: Normal rate, S1 normal, S2 normal and normal heart sounds.  Exam reveals no gallop.   No murmur heard. Pulses:      Dorsalis pedis pulses are 1+ on the right side, and 1+ on the left side.       Posterior tibial pulses are 1+ on the right side, and 1+ on the left side.  Pulmonary/Chest: Breath sounds normal. No respiratory distress. He has no wheezes.  Abdominal: Soft. Bowel sounds are normal. He exhibits no distension. There is no tenderness. There is no guarding and no CVA tenderness.  Musculoskeletal: He exhibits no edema.       Right shoulder: He exhibits no swelling and no deformity.  Neurological: He is alert and oriented to person, place, and time. He has normal strength and normal reflexes. No cranial nerve deficit or sensory deficit. Gait normal.  Skin: Skin is warm and dry. No rash noted. No cyanosis. Nails show no clubbing.  Psychiatric: He has a normal mood and affect. His speech is normal and behavior is normal. Judgment and thought content normal. Cognition and memory are normal.    Results for orders placed or performed in visit on 03/07/17  Comprehensive metabolic panel  Result Value Ref Range   Sodium 139 135 - 146 mmol/L   Potassium 3.9 3.5 - 5.3 mmol/L   Chloride 104 98 - 110 mmol/L   CO2 24 20 - 32 mmol/L   Glucose, Bld 138 (H) 65 - 99 mg/dL   BUN 21 7 - 25 mg/dL   Creat 1.39 (H) 0.70 - 1.18 mg/dL    Total Bilirubin 0.4 0.2 - 1.2 mg/dL   Alkaline Phosphatase 72 40 - 115 U/L   AST 11 10 - 35 U/L   ALT 15 9 - 46 U/L   Total Protein 7.0 6.1 - 8.1 g/dL   Albumin 4.1 3.6 - 5.1 g/dL   Calcium 9.1 8.6 - 10.3 mg/dL  Hemoglobin A1c  Result Value Ref Range   Hgb A1c MFr Bld 7.9 (H) <5.7 %   Mean Plasma Glucose 180 mg/dL   Diabetic Labs (most recent): Lab Results  Component Value Date   HGBA1C 7.9 (H) 03/07/2017   HGBA1C 6.9 (H) 10/26/2016   HGBA1C 6.4 (H) 07/28/2016   Lipid Panel     Component Value Date/Time   CHOL 145 01/19/2016 0719   TRIG 71 01/19/2016 0719   HDL 50 01/19/2016 0719   CHOLHDL 2.9 01/19/2016 0719   VLDL 14 01/19/2016 0719   LDLCALC 81 01/19/2016 0719     Assessment & Plan:   1. 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  These are all discussed in detail with the patient.  Patient came with  higher A1c of 7.9% increasing from  6.9%. Recent labs reviewed.  - I have re-counseled the patient on diet management and weight loss  by adopting a carbohydrate restricted / protein rich  Diet.  - Suggestion is made for patient to avoid simple carbohydrates   from their diet including Cakes , Desserts, Ice Cream,  Soda (  diet and regular) , Sweet Tea , Candies,  Chips, Cookies, Artificial Sweeteners,   and "Sugar-free" Products .  This will help patient to have stable blood glucose profile and potentially avoid unintended  Weight gain.  - Patient is advised to stick to a routine mealtimes to eat 3 meals  a day and avoid unnecessary snacks ( to snack only to correct hypoglycemia).  - I have approached patient with the following individualized plan to manage diabetes and patient agrees.  - He will need additional therapy to achieve control of diabetes again.  - His renal function would not allow to maximize his Janumet.  The next step will be adding basal insulin . -I will continue low dose of  Janumet 50/1000 mg ( 1/2 pil)  po BID. - He promises to do better on his diet and exercise.  - Patient specific target  for A1c; LDL, HDL, Triglycerides, and  Waist Circumference were discussed in detail.  2) BP/HTN: uncontrolled, due to the fact that he did not take his blood pressure medications this morning. Continue current medications including ACEI/ARB. 3) Lipids/HPL:  continue statins. 4)  Weight/Diet: CDE consult in progress, exercise, and carbohydrates information provided.  5) Chronic Care/Health Maintenance:  -Patient is on ACEI/ARB and Statin medications and encouraged to continue to follow up with Ophthalmology, Podiatrist at least yearly or according to recommendations, and advised to stay away from smoking. I have recommended yearly flu vaccine and pneumonia vaccination at least every 5 years; moderate intensity exercise for up to 150 minutes weekly; and  sleep for at least 7 hours a day.  - Time spent with the patient: 25 min, of which >50% was spent in reviewing his sugar logs , discussing his hypo- and hyper-glycemic episodes, reviewing  previous labs and insulin doses and developing a plan to avoid hypo- and hyper-glycemia.    - I advised patient to maintain close follow up with Sharilyn Sites, MD for primary care needs.  Follow up plan: -Return in about 3 months (around 06/14/2017) for follow up with pre-visit labs.  Glade Lloyd, MD Phone: [phone]  Fax: [phone]   03/14/2017, 8:34 AM

## 2017-04-13 DIAGNOSIS — Z1389 Encounter for screening for other disorder: Secondary | ICD-10-CM | POA: Diagnosis not present

## 2017-04-13 DIAGNOSIS — Z6839 Body mass index (BMI) 39.0-39.9, adult: Secondary | ICD-10-CM | POA: Diagnosis not present

## 2017-04-13 DIAGNOSIS — R109 Unspecified abdominal pain: Secondary | ICD-10-CM | POA: Diagnosis not present

## 2017-04-13 DIAGNOSIS — K5289 Other specified noninfective gastroenteritis and colitis: Secondary | ICD-10-CM | POA: Diagnosis not present

## 2017-04-13 DIAGNOSIS — E1165 Type 2 diabetes mellitus with hyperglycemia: Secondary | ICD-10-CM | POA: Diagnosis not present

## 2017-04-24 ENCOUNTER — Encounter (HOSPITAL_COMMUNITY): Payer: Self-pay

## 2017-04-24 ENCOUNTER — Observation Stay (HOSPITAL_COMMUNITY)
Admission: EM | Admit: 2017-04-24 | Discharge: 2017-04-26 | Disposition: A | Discharge status: Home / Self Care | Payer: Medicare Other | Source: Home / Self Care | Attending: Emergency Medicine | Admitting: Emergency Medicine

## 2017-04-24 DIAGNOSIS — E785 Hyperlipidemia, unspecified: Secondary | ICD-10-CM | POA: Insufficient documentation

## 2017-04-24 DIAGNOSIS — Z7982 Long term (current) use of aspirin: Secondary | ICD-10-CM

## 2017-04-24 DIAGNOSIS — I10 Essential (primary) hypertension: Secondary | ICD-10-CM | POA: Diagnosis present

## 2017-04-24 DIAGNOSIS — I129 Hypertensive chronic kidney disease with stage 1 through stage 4 chronic kidney disease, or unspecified chronic kidney disease: Secondary | ICD-10-CM

## 2017-04-24 DIAGNOSIS — E876 Hypokalemia: Secondary | ICD-10-CM

## 2017-04-24 DIAGNOSIS — Z79899 Other long term (current) drug therapy: Secondary | ICD-10-CM

## 2017-04-24 DIAGNOSIS — K921 Melena: Secondary | ICD-10-CM | POA: Insufficient documentation

## 2017-04-24 DIAGNOSIS — R531 Weakness: Secondary | ICD-10-CM

## 2017-04-24 DIAGNOSIS — Z87891 Personal history of nicotine dependence: Secondary | ICD-10-CM

## 2017-04-24 DIAGNOSIS — R195 Other fecal abnormalities: Secondary | ICD-10-CM | POA: Diagnosis not present

## 2017-04-24 DIAGNOSIS — M6281 Muscle weakness (generalized): Secondary | ICD-10-CM

## 2017-04-24 DIAGNOSIS — E782 Mixed hyperlipidemia: Secondary | ICD-10-CM | POA: Diagnosis present

## 2017-04-24 DIAGNOSIS — I120 Hypertensive chronic kidney disease with stage 5 chronic kidney disease or end stage renal disease: Secondary | ICD-10-CM | POA: Diagnosis not present

## 2017-04-24 DIAGNOSIS — IMO0002 Reserved for concepts with insufficient information to code with codable children: Secondary | ICD-10-CM | POA: Diagnosis present

## 2017-04-24 DIAGNOSIS — K264 Chronic or unspecified duodenal ulcer with hemorrhage: Secondary | ICD-10-CM | POA: Diagnosis not present

## 2017-04-24 DIAGNOSIS — R7989 Other specified abnormal findings of blood chemistry: Secondary | ICD-10-CM | POA: Diagnosis not present

## 2017-04-24 DIAGNOSIS — D649 Anemia, unspecified: Secondary | ICD-10-CM | POA: Diagnosis not present

## 2017-04-24 DIAGNOSIS — Z7984 Long term (current) use of oral hypoglycemic drugs: Secondary | ICD-10-CM

## 2017-04-24 DIAGNOSIS — K766 Portal hypertension: Secondary | ICD-10-CM | POA: Diagnosis not present

## 2017-04-24 DIAGNOSIS — D62 Acute posthemorrhagic anemia: Secondary | ICD-10-CM | POA: Diagnosis not present

## 2017-04-24 DIAGNOSIS — N182 Chronic kidney disease, stage 2 (mild): Secondary | ICD-10-CM | POA: Diagnosis not present

## 2017-04-24 DIAGNOSIS — K922 Gastrointestinal hemorrhage, unspecified: Secondary | ICD-10-CM | POA: Diagnosis not present

## 2017-04-24 DIAGNOSIS — K625 Hemorrhage of anus and rectum: Secondary | ICD-10-CM | POA: Diagnosis not present

## 2017-04-24 DIAGNOSIS — E1165 Type 2 diabetes mellitus with hyperglycemia: Secondary | ICD-10-CM

## 2017-04-24 DIAGNOSIS — R748 Abnormal levels of other serum enzymes: Secondary | ICD-10-CM

## 2017-04-24 DIAGNOSIS — R778 Other specified abnormalities of plasma proteins: Secondary | ICD-10-CM

## 2017-04-24 DIAGNOSIS — N186 End stage renal disease: Secondary | ICD-10-CM

## 2017-04-24 DIAGNOSIS — E1122 Type 2 diabetes mellitus with diabetic chronic kidney disease: Secondary | ICD-10-CM | POA: Insufficient documentation

## 2017-04-24 DIAGNOSIS — K3189 Other diseases of stomach and duodenum: Secondary | ICD-10-CM | POA: Diagnosis not present

## 2017-04-24 LAB — URINALYSIS, ROUTINE W REFLEX MICROSCOPIC
Bilirubin Urine: NEGATIVE
GLUCOSE, UA: NEGATIVE mg/dL
Ketones, ur: NEGATIVE mg/dL
Leukocytes, UA: NEGATIVE
Nitrite: NEGATIVE
PH: 5 (ref 5.0–8.0)
Protein, ur: NEGATIVE mg/dL
Specific Gravity, Urine: 1.023 (ref 1.005–1.030)

## 2017-04-24 LAB — CBC
HCT: 37.2 % — ABNORMAL LOW (ref 39.0–52.0)
Hemoglobin: 12.7 g/dL — ABNORMAL LOW (ref 13.0–17.0)
MCH: 29.4 pg (ref 26.0–34.0)
MCHC: 34.1 g/dL (ref 30.0–36.0)
MCV: 86.1 fL (ref 78.0–100.0)
Platelets: 273 10*3/uL (ref 150–400)
RBC: 4.32 MIL/uL (ref 4.22–5.81)
RDW: 13.5 % (ref 11.5–15.5)
WBC: 11.5 10*3/uL — AB (ref 4.0–10.5)

## 2017-04-24 LAB — BASIC METABOLIC PANEL
Anion gap: 11 (ref 5–15)
BUN: 27 mg/dL — ABNORMAL HIGH (ref 6–20)
CHLORIDE: 108 mmol/L (ref 101–111)
CO2: 24 mmol/L (ref 22–32)
CREATININE: 1.3 mg/dL — AB (ref 0.61–1.24)
Calcium: 8.8 mg/dL — ABNORMAL LOW (ref 8.9–10.3)
GFR, EST NON AFRICAN AMERICAN: 52 mL/min — AB (ref >60)
Glucose, Bld: 180 mg/dL — ABNORMAL HIGH (ref 65–99)
Potassium: 3.1 mmol/L — ABNORMAL LOW (ref 3.5–5.1)
SODIUM: 143 mmol/L (ref 135–145)

## 2017-04-24 LAB — GLUCOSE, CAPILLARY: Glucose-Capillary: 156 mg/dL — ABNORMAL HIGH (ref 65–99)

## 2017-04-24 LAB — TYPE AND SCREEN
ABO/RH(D): B POS
ANTIBODY SCREEN: NEGATIVE

## 2017-04-24 LAB — TROPONIN I
TROPONIN I: 0.09 ng/mL — AB (ref <0.03)
Troponin I: 0.09 ng/mL (ref <0.03)

## 2017-04-24 LAB — MAGNESIUM: MAGNESIUM: 1.6 mg/dL — AB (ref 1.7–2.4)

## 2017-04-24 LAB — PROTIME-INR
INR: 1.22
PROTHROMBIN TIME: 15.3 s — AB (ref 11.4–15.2)

## 2017-04-24 LAB — CBG MONITORING, ED: Glucose-Capillary: 162 mg/dL — ABNORMAL HIGH (ref 65–99)

## 2017-04-24 LAB — APTT: APTT: 33 s (ref 24–36)

## 2017-04-24 MED ORDER — ONDANSETRON HCL 4 MG/2ML IJ SOLN: 4.0000 mg | Freq: Four times a day (QID) | INTRAMUSCULAR | Status: DC | PRN

## 2017-04-24 MED ORDER — MAGNESIUM SULFATE 2 GM/50ML IV SOLN
2.0000 g | Freq: Once | INTRAVENOUS | Status: AC
  Administered 2017-04-25: 2 g via INTRAVENOUS
  Filled 2017-04-24: qty 50

## 2017-04-24 MED ORDER — POTASSIUM CHLORIDE CRYS ER 20 MEQ PO TBCR
40.0000 meq | EXTENDED_RELEASE_TABLET | Freq: Once | ORAL | Status: AC
  Administered 2017-04-24: 40 meq via ORAL
  Filled 2017-04-24: qty 2

## 2017-04-24 MED ORDER — SODIUM CHLORIDE 0.9 % IV SOLN
INTRAVENOUS | Status: DC
  Administered 2017-04-24 – 2017-04-25 (×2): via INTRAVENOUS

## 2017-04-24 MED ORDER — PANTOPRAZOLE SODIUM 40 MG IV SOLR
40.0000 mg | Freq: Two times a day (BID) | INTRAVENOUS | Status: DC
  Administered 2017-04-24 – 2017-04-26 (×4): 40 mg via INTRAVENOUS
  Filled 2017-04-24 (×4): qty 40

## 2017-04-24 MED ORDER — ONDANSETRON HCL 4 MG PO TABS: 4.0000 mg | ORAL_TABLET | Freq: Four times a day (QID) | ORAL | Status: DC | PRN

## 2017-04-24 NOTE — ED Notes (Signed)
Dr Ashok Cordia apprised of pos Guaiac as well critical trop

## 2017-04-24 NOTE — ED Notes (Signed)
Unsuccessful IV attempt- catheter would not thread

## 2017-04-24 NOTE — H&P (Signed)
History and Physical    Kyle Hicks HBZ:[id number] DOB: [birth year] DOA: 04/24/2017  PCP: Sharilyn Sites, MD  Patient coming from:  home  Chief Complaint:   weakness  HPI: Kyle Hicks is a 76 y.o. male with medical history significant of HTN, DM comes in with over a week of loose stools.  Pt reports he went to his PCP last week for a lot of diarrhea.  No abdominal pain, no fevers.  Was provided symptomatic support (he reports no abx given).  His stooling has improved a lot but it is black appearing.  No bright red.  Again no abdominal pain.  No n/v.  No fevers at home.  He has been feeling very weak.  He came to ED for the weakness.  Has no h/o previous scoping in the past.  Pt found to have heme pos stool and was referred for admission for possible gib.   Review of Systems: As per HPI otherwise 10 point review of systems negative.   Past Medical History:  Diagnosis Date  . Diabetes mellitus without complication ([place])   . HTN (hypertension)     History reviewed. No pertinent surgical history.   reports that he has quit smoking. He has never used smokeless tobacco. He reports that he does not drink alcohol or use drugs.  No Known Allergies  Family History  Problem Relation Age of Onset  . Hypertension Mother   . Hyperlipidemia Mother   . Hypertension Father   . Hyperlipidemia Father     Prior to Admission medications   Medication Sig Start Date End Date Taking? Authorizing Provider  aspirin EC 81 MG tablet Take 81 mg by mouth daily.   Yes [provider]  atorvastatin (LIPITOR) 20 MG tablet Take 20 mg by mouth daily.   Yes [provider]  hydrochlorothiazide (HYDRODIURIL) 25 MG tablet Take 25 mg by mouth daily.   Yes [provider]  NIFEdipine (PROCARDIA-XL/ADALAT-CC/NIFEDICAL-XL) 30 MG 24 hr tablet take 1 tablet by mouth once daily 02/14/17  Yes Nida, Marella Chimes, MD  ramipril (ALTACE) 10 MG capsule take 1 capsule by mouth once daily 02/23/17   Yes Nida, Marella Chimes, MD  sitaGLIPtin-metformin (JANUMET) 50-1000 MG tablet Take 0.5 tablets by mouth 2 (two) times daily. 01/10/17  Yes Cassandria Anger, MD    Physical Exam: Vitals:   04/24/17 1657  BP: (!) 153/80  Pulse: 63  Resp: 18  Temp: 98.9 F (37.2 C)  TempSrc: Oral  SpO2: 100%  Weight: 113.4 kg (250 lb)  Height: 5\' 10"  (1.778 m)     Constitutional: NAD, calm, comfortable Vitals:   04/24/17 1657  BP: (!) 153/80  Pulse: 63  Resp: 18  Temp: 98.9 F (37.2 C)  TempSrc: Oral  SpO2: 100%  Weight: 113.4 kg (250 lb)  Height: 5\' 10"  (1.778 m)   Eyes: PERRL, lids and conjunctivae normal ENMT: Mucous membranes are moist. Posterior pharynx clear of any exudate or lesions.Normal dentition.  Neck: normal, supple, no masses, no thyromegaly Respiratory: clear to auscultation bilaterally, no wheezing, no crackles. Normal respiratory effort. No accessory muscle use.  Cardiovascular: Regular rate and rhythm, no murmurs / rubs / gallops. No extremity edema. 2+ pedal pulses. No carotid bruits.  Abdomen: no tenderness, no masses palpated. No hepatosplenomegaly. Bowel sounds positive.  Musculoskeletal: no clubbing / cyanosis. No joint deformity upper and lower extremities. Good ROM, no contractures. Normal muscle tone.  Skin: no rashes, lesions, ulcers. No induration Neurologic: CN 2-12 grossly intact.  Sensation intact, DTR normal. Strength 5/5 in all 4.  Psychiatric: Normal judgment and insight. Alert and oriented x 3. Normal mood.    Labs on Admission: I have personally reviewed following labs and imaging studies  CBC:  Recent Labs Lab 04/24/17 1720  WBC 11.5*  HGB 12.7*  HCT 37.2*  MCV 86.1  PLT 992   Basic Metabolic Panel:  Recent Labs Lab 04/24/17 1720  NA 143  K 3.1*  CL 108  CO2 24  GLUCOSE 180*  BUN 27*  CREATININE 1.30*  CALCIUM 8.8*   GFR: Estimated Creatinine Clearance: 62.9 mL/min (A) (by C-G formula based on SCr of 1.3 mg/dL  (H)).  Cardiac Enzymes:  Recent Labs Lab 04/24/17 1720  TROPONINI 0.09*   CBG:  Recent Labs Lab 04/24/17 1702  GLUCAP 162*   Urine analysis:    Component Value Date/Time   COLORURINE YELLOW 04/24/2017 1900   APPEARANCEUR CLEAR 04/24/2017 1900   LABSPEC 1.023 04/24/2017 1900   PHURINE 5.0 04/24/2017 1900   GLUCOSEU NEGATIVE 04/24/2017 1900   HGBUR SMALL (A) 04/24/2017 1900   BILIRUBINUR NEGATIVE 04/24/2017 1900   KETONESUR NEGATIVE 04/24/2017 1900   PROTEINUR NEGATIVE 04/24/2017 1900   UROBILINOGEN 0.2 02/23/2013 1151   NITRITE NEGATIVE 04/24/2017 1900   LEUKOCYTESUR NEGATIVE 04/24/2017 1900   Radiological Exams on Admission: No results found.  EKG:   Reported to be rbbb, is not crossing over in epic so cannot review, will find or repeat Old chart reviewed Case discussed with dr Ashok Cordia  Assessment/Plan 76 yo male with dark stool heme positive and generalized weakness  Principal Problem:   GIB (gastrointestinal bleeding)- repeat h/h later tonight.  bp stable. Cancel gi consult as pt is refusing any scoping, he does not want any egd or colonoscopy if needed.  Repeat cbc in am.  Type and screen.  hgb 12.  Transfuse if drops significantly or becomes HD unstable.  Ck cdiff and stool cx but appears his diarrhea is improved, may have had recent viral gastroenteritis.  Active Problems:   Uncontrolled type 2 diabetes mellitus with stage 2 chronic kidney disease ([place])- ssi   Morbid obesity ([place])- noted   HTN (hypertension)- holding bp meds in setting of possible gi bleed   Hyperlipidemia- cont statin   Elevated troponin- serial troponin, likely incidental.  Only 0.09.  Holding asa in setting of bleed   Weakness generalized- as above     DVT prophylaxis:  scds Code Status:  full Family Communication:  none Disposition Plan:  Per day team Consults called:   GI requested in EPIC Admission status:   observation   Tyliyah Mcmeekin A MD [facility]  If 7PM-7AM,  please contact night-coverage [email or web address] Password [email or web address]  04/24/2017, 8:25 PM

## 2017-04-24 NOTE — ED Triage Notes (Signed)
Reports of being seen 1 week ago at PCP for generalized weakness and diarrhea. States he feels fatigue and now has loss of appetite. Denies pain.

## 2017-04-24 NOTE — ED Notes (Signed)
Date and time results received: 04/24/17 1914 (use smartphrase ".now" to insert current time)  Test: troponin Critical Value: 0.09  Name of Provider Notified: Dr. Ashok Cordia  Orders Received? Or Actions Taken?: no/na

## 2017-04-24 NOTE — ED Notes (Signed)
Report to Rene, RN.

## 2017-04-24 NOTE — ED Notes (Signed)
Orthostatic VS Lying 123/78,  99 16,   99 per cent RA  Sitting: 112/74,  106,16.,   98 per cent RA  Standing: 141/74 ,     108, 20, 99 per cent RA

## 2017-04-24 NOTE — ED Provider Notes (Signed)
[facility] DEPT Provider Note   CSN: [id number] Arrival date & time: 04/24/17  1647     History   Chief Complaint Chief Complaint  Patient presents with  . Fatigue  . Loss of Appetite    HPI Kyle Hicks is a 76 y.o. male.  Patient c/o generalized weakness in past weeks. Symptoms gradual onset, constant, dull. States loose stools in past week, approximately 1 per day, including today. In past 1-2 days stools very dark. Denies hx gi bleeding. No abd pain. No vomiting. Poor appetite. Poor po intake. No focal or unilateral numbness/weakness. No change in speech or vision. Denies headaches. No cough or uri c/o. No sob. No chest pain. Denies leg pain or swelling. No recent change in meds. No fever or chills.    The history is provided by the patient.    Past Medical History:  Diagnosis Date  . Diabetes mellitus without complication ([place])   . HTN (hypertension)     Patient Active Problem List   Diagnosis Date Noted  . Hyperlipidemia 06/24/2015  . Encephalopathy acute 02/23/2013  . Acute on chronic renal failure ([place]) 02/23/2013  . Morbid obesity ([place]) 02/23/2013  . Uncontrolled type 2 diabetes mellitus with stage 2 chronic kidney disease ([place])   . HTN (hypertension)     History reviewed. No pertinent surgical history.     Home Medications    Prior to Admission medications   Medication Sig Start Date End Date Taking? Authorizing Provider  aspirin EC 81 MG tablet Take 81 mg by mouth daily.    [provider]  atorvastatin (LIPITOR) 20 MG tablet Take 20 mg by mouth daily.    [provider]  hydrochlorothiazide (HYDRODIURIL) 25 MG tablet Take 25 mg by mouth daily.    [provider]  NIFEdipine (PROCARDIA-XL/ADALAT-CC/NIFEDICAL-XL) 30 MG 24 hr tablet take 1 tablet by mouth once daily 02/14/17   Cassandria Anger, MD  ramipril (ALTACE) 10 MG capsule take 1 capsule by mouth once daily 02/23/17   Cassandria Anger, MD    sitaGLIPtin-metformin (JANUMET) 50-1000 MG tablet Take 0.5 tablets by mouth 2 (two) times daily. 01/10/17   Cassandria Anger, MD    Family History Family History  Problem Relation Age of Onset  . Hypertension Mother   . Hyperlipidemia Mother   . Hypertension Father   . Hyperlipidemia Father     Social History Social History  Substance Use Topics  . Smoking status: Former Research scientist (life sciences)  . Smokeless tobacco: Never Used  . Alcohol use No     Allergies   Patient has no known allergies.   Review of Systems Review of Systems  Constitutional: Negative for fever.  HENT: Negative for sore throat.   Eyes: Negative for visual disturbance.  Respiratory: Negative for cough and shortness of breath.   Cardiovascular: Negative for chest pain.  Gastrointestinal: Negative for abdominal pain, blood in stool and vomiting.  Endocrine: Negative for polyuria.  Genitourinary: Negative for dysuria and flank pain.  Musculoskeletal: Negative for back pain.  Skin: Negative for rash.  Neurological: Negative for numbness and headaches.  Hematological: Does not bruise/bleed easily.  Psychiatric/Behavioral: Negative for confusion.     Physical Exam Updated Vital Signs BP (!) 153/80 (BP Location: Right Arm)   Pulse 63   Temp 98.9 F (37.2 C) (Oral)   Resp 18   Ht 1.778 m (5\' 10" )   Wt 113.4 kg (250 lb)   SpO2 100%   BMI 35.87 kg/m   Physical  Exam  Constitutional: He is oriented to person, place, and time. He appears well-developed and well-nourished. No distress.  HENT:  Mouth/Throat: Oropharynx is clear and moist.  Eyes: Pupils are equal, round, and reactive to light. Conjunctivae are normal.  Neck: Neck supple. No tracheal deviation present. No thyromegaly present.  No bruits.   Cardiovascular: Normal rate, regular rhythm, normal heart sounds and intact distal pulses.  Exam reveals no gallop and no friction rub.   No murmur heard. Pulmonary/Chest: Effort normal and breath sounds  normal. No accessory muscle usage. No respiratory distress.  Abdominal: Soft. Bowel sounds are normal. He exhibits no distension and no mass. There is no tenderness. There is no rebound and no guarding. No hernia.  Genitourinary:  Genitourinary Comments: Black, liquid, stool  Musculoskeletal: He exhibits no edema.  Neurological: He is alert and oriented to person, place, and time.  Speech clear/fluent. Motor intact bil. Steady gait.   Skin: Skin is warm and dry. He is not diaphoretic.  Psychiatric: He has a normal mood and affect.  Nursing note and vitals reviewed.    ED Treatments / Results  Labs (all labs ordered are listed, but only abnormal results are displayed) Results for orders placed or performed during the hospital encounter of 32/95/18  Basic metabolic panel  Result Value Ref Range   Sodium 143 135 - 145 mmol/L   Potassium 3.1 (L) 3.5 - 5.1 mmol/L   Chloride 108 101 - 111 mmol/L   CO2 24 22 - 32 mmol/L   Glucose, Bld 180 (H) 65 - 99 mg/dL   BUN 27 (H) 6 - 20 mg/dL   Creatinine, Ser 1.30 (H) 0.61 - 1.24 mg/dL   Calcium 8.8 (L) 8.9 - 10.3 mg/dL   GFR calc non Af Amer 52 (L) >60 mL/min   GFR calc Af Amer >60 >60 mL/min   Anion gap 11 5 - 15  CBC  Result Value Ref Range   WBC 11.5 (H) 4.0 - 10.5 K/uL   RBC 4.32 4.22 - 5.81 MIL/uL   Hemoglobin 12.7 (L) 13.0 - 17.0 g/dL   HCT 37.2 (L) 39.0 - 52.0 %   MCV 86.1 78.0 - 100.0 fL   MCH 29.4 26.0 - 34.0 pg   MCHC 34.1 30.0 - 36.0 g/dL   RDW 13.5 11.5 - 15.5 %   Platelets 273 150 - 400 K/uL  Troponin I  Result Value Ref Range   Troponin I 0.09 (HH) <0.03 ng/mL  CBG monitoring, ED  Result Value Ref Range   Glucose-Capillary 162 (H) 65 - 99 mg/dL   EKG  EKG Interpretation None      ED ECG REPORT   Date: 04/24/2017  Rate: 103  Rhythm: sinus tachycardia  QRS Axis: left  Intervals: normal  ST/T Wave abnormalities: nonspecific ST/T changes  Conduction Disutrbances:right bundle branch block  Narrative  Interpretation:   Old EKG Reviewed: unchanged and changes noted  I have personally reviewed the EKG tracing  Radiology No results found.  Procedures Procedures (including critical care time)  Medications Ordered in ED Medications  potassium chloride SA (K-DUR,KLOR-CON) CR tablet 40 mEq (not administered)     Initial Impression / Assessment and Plan / ED Course  I have reviewed the triage vital signs and the nursing notes.  Pertinent labs & imaging results that were available during my care of the patient were reviewed by me and considered in my medical decision making (see chart for details).  Iv ns. Labs.  Reviewed nursing notes  and prior charts for additional history.   Patient with black stools, heme positive, pt symptomatic. Will admit.  Pt also with mildly elevated trop, gen weak.  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 Will trend troponing. No chest pain or discomfort.  Medical service consulted for admission.   Final Clinical Impressions(s) / ED Diagnoses   Final diagnoses:  None    New Prescriptions New Prescriptions   No medications on file     Lajean Saver, MD 04/24/17 1931

## 2017-04-25 ENCOUNTER — Observation Stay (HOSPITAL_BASED_OUTPATIENT_CLINIC_OR_DEPARTMENT_OTHER): Payer: Medicare Other

## 2017-04-25 DIAGNOSIS — K766 Portal hypertension: Secondary | ICD-10-CM | POA: Diagnosis not present

## 2017-04-25 DIAGNOSIS — E785 Hyperlipidemia, unspecified: Secondary | ICD-10-CM | POA: Diagnosis not present

## 2017-04-25 DIAGNOSIS — K922 Gastrointestinal hemorrhage, unspecified: Secondary | ICD-10-CM

## 2017-04-25 DIAGNOSIS — R195 Other fecal abnormalities: Secondary | ICD-10-CM | POA: Diagnosis not present

## 2017-04-25 DIAGNOSIS — D62 Acute posthemorrhagic anemia: Secondary | ICD-10-CM | POA: Diagnosis not present

## 2017-04-25 DIAGNOSIS — I351 Nonrheumatic aortic (valve) insufficiency: Secondary | ICD-10-CM

## 2017-04-25 DIAGNOSIS — E1122 Type 2 diabetes mellitus with diabetic chronic kidney disease: Secondary | ICD-10-CM | POA: Diagnosis not present

## 2017-04-25 DIAGNOSIS — R531 Weakness: Secondary | ICD-10-CM | POA: Diagnosis not present

## 2017-04-25 DIAGNOSIS — N186 End stage renal disease: Secondary | ICD-10-CM

## 2017-04-25 DIAGNOSIS — N182 Chronic kidney disease, stage 2 (mild): Secondary | ICD-10-CM

## 2017-04-25 DIAGNOSIS — R748 Abnormal levels of other serum enzymes: Secondary | ICD-10-CM | POA: Diagnosis not present

## 2017-04-25 DIAGNOSIS — I10 Essential (primary) hypertension: Secondary | ICD-10-CM

## 2017-04-25 DIAGNOSIS — K3189 Other diseases of stomach and duodenum: Secondary | ICD-10-CM | POA: Diagnosis not present

## 2017-04-25 DIAGNOSIS — K921 Melena: Secondary | ICD-10-CM

## 2017-04-25 DIAGNOSIS — E1165 Type 2 diabetes mellitus with hyperglycemia: Secondary | ICD-10-CM

## 2017-04-25 DIAGNOSIS — K264 Chronic or unspecified duodenal ulcer with hemorrhage: Secondary | ICD-10-CM | POA: Diagnosis not present

## 2017-04-25 DIAGNOSIS — K8020 Calculus of gallbladder without cholecystitis without obstruction: Secondary | ICD-10-CM | POA: Diagnosis not present

## 2017-04-25 DIAGNOSIS — I129 Hypertensive chronic kidney disease with stage 1 through stage 4 chronic kidney disease, or unspecified chronic kidney disease: Secondary | ICD-10-CM | POA: Diagnosis not present

## 2017-04-25 DIAGNOSIS — I959 Hypotension, unspecified: Secondary | ICD-10-CM | POA: Diagnosis not present

## 2017-04-25 LAB — BASIC METABOLIC PANEL
ANION GAP: 9 (ref 5–15)
BUN: 29 mg/dL — ABNORMAL HIGH (ref 6–20)
CALCIUM: 8 mg/dL — AB (ref 8.9–10.3)
CHLORIDE: 110 mmol/L (ref 101–111)
CO2: 22 mmol/L (ref 22–32)
Creatinine, Ser: 1.24 mg/dL (ref 0.61–1.24)
GFR calc non Af Amer: 56 mL/min — ABNORMAL LOW (ref >60)
GLUCOSE: 140 mg/dL — AB (ref 65–99)
Potassium: 3.6 mmol/L (ref 3.5–5.1)
Sodium: 141 mmol/L (ref 135–145)

## 2017-04-25 LAB — ECHOCARDIOGRAM COMPLETE
AO mean calculated velocity dopler: 98.1 cm/s
AOPV: 0.61 m/s
AV Area VTI: 1.73 cm2
AV Area mean vel: 1.64 cm2
AV VEL mean LVOT/AV: 0.58
AV area mean vel ind: 0.69 cm2/m2
AV peak Index: 0.73
AV vel: 2.39
AVA: 2.39 cm2
AVAREAVTIIND: 1.01 cm2/m2
AVG: 5 mmHg
AVLVOTPG: 5 mmHg
AVPG: 12 mmHg
AVPKVEL: 176 cm/s
CHL CUP DOP CALC LVOT VTI: 23.1 cm
CHL CUP MV DEC (S): 342
E decel time: 342 msec
EERAT: 7.39
FS: 39 % (ref 28–44)
Height: 70 in
LA ID, A-P, ES: 25 mm
LA diam end sys: 25 mm
LA vol: 73.8 mL
LADIAMINDEX: 1.06 cm/m2
LAVOLA4C: 63.2 mL
LAVOLIN: 31.2 mL/m2
LV TDI E'LATERAL: 8.7
LV TDI E'MEDIAL: 5.55
LVEEAVG: 7.39
LVEEMED: 7.39
LVELAT: 8.7 cm/s
LVOT area: 2.84 cm2
LVOT diameter: 19 mm
LVOT peak VTI: 0.84 cm
LVOT peak vel: 107 cm/s
LVOTSV: 66 mL
MV pk A vel: 96.4 m/s
MV pk E vel: 64.3 m/s
P 1/2 time: 431 ms
PW: 11 mm — AB (ref 0.6–1.1)
TAPSE: 17.5 mm
VTI: 27.5 cm
Valve area index: 1.01
Weight: 3875.2 oz

## 2017-04-25 LAB — CBC
HCT: 33.9 % — ABNORMAL LOW (ref 39.0–52.0)
HEMOGLOBIN: 11.3 g/dL — AB (ref 13.0–17.0)
MCH: 29.2 pg (ref 26.0–34.0)
MCHC: 33.3 g/dL (ref 30.0–36.0)
MCV: 87.6 fL (ref 78.0–100.0)
Platelets: 248 10*3/uL (ref 150–400)
RBC: 3.87 MIL/uL — AB (ref 4.22–5.81)
RDW: 13.9 % (ref 11.5–15.5)
WBC: 9 10*3/uL (ref 4.0–10.5)

## 2017-04-25 LAB — VITAMIN B12: Vitamin B-12: 443 pg/mL (ref 180–914)

## 2017-04-25 LAB — T4, FREE: FREE T4: 1.76 ng/dL — AB (ref 0.61–1.12)

## 2017-04-25 LAB — TROPONIN I
TROPONIN I: 0.08 ng/mL — AB (ref <0.03)
Troponin I: 0.09 ng/mL (ref <0.03)

## 2017-04-25 LAB — TSH: TSH: 0.663 u[IU]/mL (ref 0.350–4.500)

## 2017-04-25 MED ORDER — ATORVASTATIN CALCIUM 20 MG PO TABS
20.0000 mg | ORAL_TABLET | Freq: Every day | ORAL | Status: DC
  Administered 2017-04-25: 20 mg via ORAL
  Filled 2017-04-25: qty 1

## 2017-04-25 MED ORDER — HYDRALAZINE HCL 20 MG/ML IJ SOLN: 10.0000 mg | Freq: Four times a day (QID) | INTRAMUSCULAR | Status: DC | PRN

## 2017-04-25 MED ORDER — PERFLUTREN LIPID MICROSPHERE
1.0000 mL | INTRAVENOUS | Status: AC | PRN
  Administered 2017-04-25: 1 mL via INTRAVENOUS
  Administered 2017-04-25: 2 mL via INTRAVENOUS
  Filled 2017-04-25: qty 10

## 2017-04-25 NOTE — Care Management Note (Signed)
Case Management Note  Patient Details  Name: Kyle Hicks MRN: [id number] Date of Birth: Jul 21, 1941  Subjective/Objective:  Adm from home with GIB. From home with wife, ind with ADL's. No home health or DME PTA. Has PCP, still drives, and reports no issues affording medications.                Action/Plan: Anticipate DC home with self care. GI consulted.    Expected Discharge Date:  04/26/17               Expected Discharge Plan:  Home/Self Care  In-House Referral:     Discharge planning Services  CM Consult  Post Acute Care Choice:  NA Choice offered to:  NA  DME Arranged:    DME Agency:     HH Arranged:    HH Agency:     Status of Service:  Completed, signed off  If discussed at [employer] of Stay Meetings, dates discussed:    Additional Comments:  Kyle Hicks, Kyle Reading, RN 04/25/2017, 10:20 AM

## 2017-04-25 NOTE — Care Management Obs Status (Signed)
[facility] NOTIFICATION   Patient Details  Name: ABDULAHI SCHOR MRN: [id number] Date of Birth: [birth year]   Medicare Observation Status Notification Given:  Yes    Deissy Guilbert, Chauncey Reading, RN 04/25/2017, 10:14 AM

## 2017-04-25 NOTE — Discharge Summary (Signed)
Physician Discharge Summary  Kyle Hicks DGL:[id number] DOB: [birth year] DOA: 04/24/2017  PCP: Sharilyn Sites, MD  Admit date: 04/24/2017 Discharge date: 04/26/2017  Admitted From: Home Disposition:  Home   Recommendations for Outpatient Follow-up:  1. Follow up with PCP in 1-2 weeks 2. Please obtain BMP/CBC in one week   Discharge Condition: Stable CODE STATUS: FULL Diet recommendation: Heart Healthy / Carb Modified   Brief/Interim Summary: 76 year old male with a history of diabetes mellitus, hyperlipidemia, and hypertension presenting with 4 day history of generalized weakness and melanotic stools. The patient states that he has been having loose stools for the past 4-5 days. He describes his stools as "toothpaste"like consistency. He denies any recent travels, unusual or undercooked foods, or sick contacts. He denies any fevers, chills, chest pain, short of breath, dizziness, syncope, abdominal pain, dysuria, hematuria, painful bowel movements. The patient has never had endoscopy. Upon presentation, patient was noted to have WBC 11.5 with hemoglobin 12.7 and platelets 248,000. The patient was afebrile and hemodynamically stable surgery 100% on room air. GI was consulted to assist with management. They felt the patient ultimately will need endoscopy including EGD and colonoscopy. However the patient refused any procedures during admission. The patient was monitored overnight, and he remained clinically and hemodynamically stable. He will follow up in the outpatient setting with his PCP and GI.   Discharge Diagnoses:  Melena -Concerned about upper GI bleed -presenting Hgb 12.7 - GI Consult appreciated-->ultimately needs endoscopy when/if patient agrees -Presently, patient continues to refuse endoscopy -discharge with PPI bid -Diet advanced as patient refused endoscopy -Trend hemoglobin--remained largely stable with drop due to dilution -holding ASA during the  admission  Elevated Troponin -due to demand ischemia -no angina -Echo--EF 55%, no WMA, grade 1 DD, mild AI  -EKG--sinus, nonspecific T-wave changes  CKD stage 2 -baseline creatinine 1.1-1.3 -serum creatinine 1.28 on day of d/c  Essential hypertension -Continue ramipril -Holding HCTZ--restart after d/c -recheck BMP in one week  Diabetes mellitus type 2 -NovoLog sliding scale -Holding Janumet--restart after d/c -03/07/17 HbA1C--7.9  Hyperlipidemia -continue statin  Generalized Weakness -UA--neg for pyuria -B12--443 -TSH--0.663 -PT eval--supervison only  Hypomagnesemia/Hypokalemia -repleted     Discharge Instructions   Allergies as of 04/26/2017   No Known Allergies     Medication List    TAKE these medications   aspirin EC 81 MG tablet Take 81 mg by mouth daily.   atorvastatin 20 MG tablet Commonly known as:  LIPITOR Take 20 mg by mouth daily.   hydrochlorothiazide 25 MG tablet Commonly known as:  HYDRODIURIL Take 25 mg by mouth daily.   NIFEdipine 30 MG 24 hr tablet Commonly known as:  PROCARDIA-XL/ADALAT-CC/NIFEDICAL-XL take 1 tablet by mouth once daily   pantoprazole 40 MG tablet Commonly known as:  PROTONIX Take 1 tablet (40 mg total) by mouth 2 (two) times daily.   ramipril 10 MG capsule Commonly known as:  ALTACE take 1 capsule by mouth once daily   sitaGLIPtin-metformin 50-1000 MG tablet Commonly known as:  JANUMET Take 0.5 tablets by mouth 2 (two) times daily.       No Known Allergies  Consultations:  GI   Procedures/Studies: No results found.      Discharge Exam: Vitals:   04/25/17 2350 04/26/17 0509  BP: 130/76 137/76  Pulse: 68 77  Resp: 16 16  Temp: 98.9 F (37.2 C) 98.7 F (37.1 C)  SpO2: 98% 100%   Vitals:   04/25/17 1610 04/25/17 2041 04/25/17 2350 04/26/17 0509  BP: Marland Kitchen)  146/69  130/76 137/76  Pulse: 77  68 77  Resp:   16 16  Temp:   98.9 F (37.2 C) 98.7 F (37.1 C)  TempSrc:   Oral Oral   SpO2: 98% 95% 98% 100%  Weight:      Height:        General: Pt is alert, awake, not in acute distress Cardiovascular: RRR, S1/S2 +, no rubs, no gallops Respiratory: CTA bilaterally, no wheezing, no rhonchi Abdominal: Soft, NT, ND, bowel sounds + Extremities: no edema, no cyanosis   The results of significant diagnostics from this hospitalization (including imaging, microbiology, ancillary and laboratory) are listed below for reference.    Significant Diagnostic Studies: No results found.   Microbiology: No results found for this or any previous visit (from the past 240 hour(s)).   Labs: Basic Metabolic Panel:  Recent Labs Lab 04/24/17 1720 04/24/17 1733 04/25/17 0321 04/26/17 0452  NA 143  --  141 139  K 3.1*  --  3.6 3.7  CL 108  --  110 109  CO2 24  --  22 23  GLUCOSE 180*  --  140* 146*  BUN 27*  --  29* 23*  CREATININE 1.30*  --  1.24 1.28*  CALCIUM 8.8*  --  8.0* 7.9*  MG  --  1.6*  --  1.8   Liver Function Tests: No results for input(s): AST, ALT, ALKPHOS, BILITOT, PROT, ALBUMIN in the last 168 hours. No results for input(s): LIPASE, AMYLASE in the last 168 hours. No results for input(s): AMMONIA in the last 168 hours. CBC:  Recent Labs Lab 04/24/17 1720 04/25/17 0321 04/26/17 0452  WBC 11.5* 9.0 7.6  HGB 12.7* 11.3* 10.7*  HCT 37.2* 33.9* 32.8*  MCV 86.1 87.6 89.6  PLT 273 248 223   Cardiac Enzymes:  Recent Labs Lab 04/24/17 1720 04/24/17 2016 04/25/17 0321 04/25/17 0905  TROPONINI 0.09* 0.09* 0.08* 0.09*   BNP: Invalid input(s): POCBNP CBG:  Recent Labs Lab 04/24/17 1702 04/24/17 2142  GLUCAP 162* 156*    Time coordinating discharge:  Greater than 30 minutes  Signed:  Kathy Wahid, DO [facility] Pager: [phone] 04/26/2017, 11:36 AM

## 2017-04-25 NOTE — Progress Notes (Signed)
CRITICAL VALUE ALERT  Critical Value:  toponin - 0.09  Date & Time Notied:  04/25/17  Provider Notified: Dr. Carles Collet  Orders Received/Actions taken:  troponins slightly elevated previously - MD already aware, will continue to monitor

## 2017-04-25 NOTE — Progress Notes (Signed)
*  PRELIMINARY RESULTS* Echocardiogram 2D Echocardiogram has been performed with Definity.  Samuel Germany 04/25/2017, 4:17 PM

## 2017-04-25 NOTE — Progress Notes (Signed)
PROGRESS NOTE  Kyle Hicks QTM:[id number] DOB: Apr 01, 1941 DOA: 04/24/2017 PCP: Sharilyn Sites, MD  Brief History:  76 year old male with a history of diabetes mellitus, hyperlipidemia, and hypertension presenting with 4 day history of generalized weakness and melanotic stools. The patient states that he has been having loose stools for the past 4-5 days. He describes his stools as "toothpaste"like consistency. He denies any recent travels, unusual or undercooked foods, or sick contacts. He denies any fevers, chills, chest pain, short of breath, dizziness, syncope, abdominal pain, dysuria, hematuria, painful bowel movements. The patient has never had endoscopy. Upon presentation, patient was noted to have WBC 11.5 with hemoglobin 12.7 and platelets 248,000. The patient was afebrile and hemodynamically stable surgery 100% on room air. Gen. was consulted to assist with management.  Assessment/Plan: Melena -Concerned about upper GI bleed -presenting Hgb 12.7 -Consult GI -Presently, patient is hesitant to undergo endoscopy -Start PPI -Clear liquid diet -Trend hemoglobin -Continue IV fluids  Elevated Troponin -no angina -Echo -EKG  CKD stage 2 -baseline creatinine 1.1-1.3 -am BMP  Essential hypertension -Continue ramipril -Holding HCTZ  Diabetes mellitus type 2 -NovoLog sliding scale -Holding Janumet -03/07/17 HbA1C--7.9  Hyperlipidemia -continue statin  Generalized Weakness -UA--neg for pyuria -B12 -TSH -PT eval  Hypomagnesemia/Hypokalemia -replete -repeat am labs  Disposition Plan:   Home in 1-2 days  Family Communication:   No Family at bedside  Consultants:  GI  Code Status:  FULL   DVT Prophylaxis:  SCDs   Procedures: As Listed in Progress Note Above  Antibiotics: None    Subjective: Patient denies fevers, chills, headache, chest pain, dyspnea, nausea, vomiting, diarrhea, abdominal pain, dysuria, hematuria, hematochezia, and  melena.   Objective: Vitals:   04/24/17 1657 04/24/17 2120 04/25/17 0506  BP: (!) 153/80 117/72 137/70  Pulse: 63 (!) 102 78  Resp: 18 18 18   Temp: 98.9 F (37.2 C) 99.5 F (37.5 C) 98.9 F (37.2 C)  TempSrc: Oral Oral Oral  SpO2: 100% 97%   Weight: 113.4 kg (250 lb) 109.9 kg (242 lb 3.2 oz)   Height: 5\' 10"  (1.778 m) 5\' 10"  (1.778 m)     Intake/Output Summary (Last 24 hours) at 04/25/17 0708 Last data filed at 04/25/17 0300  Gross per 24 hour  Intake              735 ml  Output                0 ml  Net              735 ml   Weight change:  Exam:   General:  Pt is alert, follows commands appropriately, not in acute distress  HEENT: No icterus, No thrush, No neck mass, [place]/AT  Cardiovascular: RRR, S1/S2, no rubs, no gallops  Respiratory: CTA bilaterally, no wheezing, no crackles, no rhonchi  Abdomen: Soft/+BS, non tender, non distended, no guarding  Extremities: No edema, No lymphangitis, No petechiae, No rashes, no synovitis   Data Reviewed: I have personally reviewed following labs and imaging studies Basic Metabolic Panel:  Recent Labs Lab 04/24/17 1720 04/24/17 1733 04/25/17 0321  NA 143  --  141  K 3.1*  --  3.6  CL 108  --  110  CO2 24  --  22  GLUCOSE 180*  --  140*  BUN 27*  --  29*  CREATININE 1.30*  --  1.24  CALCIUM 8.8*  --  8.0*  MG  --  1.6*  --    Liver Function Tests: No results for input(s): AST, ALT, ALKPHOS, BILITOT, PROT, ALBUMIN in the last 168 hours. No results for input(s): LIPASE, AMYLASE in the last 168 hours. No results for input(s): AMMONIA in the last 168 hours. Coagulation Profile:  Recent Labs Lab 04/24/17 2016  INR 1.22   CBC:  Recent Labs Lab 04/24/17 1720 04/25/17 0321  WBC 11.5* 9.0  HGB 12.7* 11.3*  HCT 37.2* 33.9*  MCV 86.1 87.6  PLT 273 248   Cardiac Enzymes:  Recent Labs Lab 04/24/17 1720 04/24/17 2016 04/25/17 0321  TROPONINI 0.09* 0.09* 0.08*   BNP: Invalid input(s):  POCBNP CBG:  Recent Labs Lab 04/24/17 1702 04/24/17 2142  GLUCAP 162* 156*   HbA1C: No results for input(s): HGBA1C in the last 72 hours. Urine analysis:    Component Value Date/Time   COLORURINE YELLOW 04/24/2017 1900   APPEARANCEUR CLEAR 04/24/2017 1900   LABSPEC 1.023 04/24/2017 1900   PHURINE 5.0 04/24/2017 1900   GLUCOSEU NEGATIVE 04/24/2017 1900   HGBUR SMALL (A) 04/24/2017 1900   BILIRUBINUR NEGATIVE 04/24/2017 1900   KETONESUR NEGATIVE 04/24/2017 1900   PROTEINUR NEGATIVE 04/24/2017 1900   UROBILINOGEN 0.2 02/23/2013 1151   NITRITE NEGATIVE 04/24/2017 1900   LEUKOCYTESUR NEGATIVE 04/24/2017 1900   Sepsis Labs: @LABRCNTIP (procalcitonin:4,lacticidven:4) )No results found for this or any previous visit (from the past 240 hour(s)).   Scheduled Meds: . pantoprazole (PROTONIX) IV  40 mg Intravenous Q12H   Continuous Infusions: . sodium chloride 100 mL/hr at 04/24/17 2203    Procedures/Studies: No results found.  Alaney Witter, DO  [facility] Pager [phone]  If 7PM-7AM, please contact night-coverage [email or web address] Password [email or web address] 04/25/2017, 7:08 AM   LOS: 0 days

## 2017-04-25 NOTE — Consult Note (Signed)
Referring Provider: [facility] Primary Care Physician:  Sharilyn Sites, MD Primary Gastroenterologist:  Dr. Oneida Alar (previously unassigned)  Date of Admission: 04/25/17 Date of Consultation: 04/25/17  Reason for Consultation:  Melena, anemia  HPI:  Kyle Hicks is a 76 y.o. male with a past medical history of DM and hypertension presented to the ER with complaints of weakness. He had loose stools for about 4-5 days described as "toothpaste-like" consistency. Saw PCP who was given symptomatic treatment (no antibiotics) but stool became black in color (no red). Heme+ per ER notes. Denies abdominal pain, n/V, fever. No history of colonoscopy or EGD. In the ER his WBC count was 11.5, hgb 12.7, plt 273 and hemodynamically stable. Over night his hgb dropped to 11.3 with likely some hydration effect as WBC declined to 9.0 and plt declined to 248. 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 Electrolytes normal.  Initial GI consult was cancelled because patient was refusing any endoscopic evaluation. Appears we have been reconsulted due to heme+ stool and decline in hgb although notes state patient remains very hesitant for any endoscopic procedure. Is on PPI.   Past Medical History:  Diagnosis Date  . Diabetes mellitus without complication ([place])   . HTN (hypertension)     History reviewed. No pertinent surgical history.  Prior to Admission medications   Medication Sig Start Date End Date Taking? Authorizing Provider  aspirin EC 81 MG tablet Take 81 mg by mouth daily.   Yes [provider]  atorvastatin (LIPITOR) 20 MG tablet Take 20 mg by mouth daily.   Yes [provider]  hydrochlorothiazide (HYDRODIURIL) 25 MG tablet Take 25 mg by mouth daily.   Yes [provider]  NIFEdipine (PROCARDIA-XL/ADALAT-CC/NIFEDICAL-XL) 30 MG 24 hr tablet take 1 tablet by mouth once daily 02/14/17  Yes Nida, Marella Chimes, MD  ramipril (ALTACE) 10  MG capsule take 1 capsule by mouth once daily 02/23/17  Yes Nida, Marella Chimes, MD  sitaGLIPtin-metformin (JANUMET) 50-1000 MG tablet Take 0.5 tablets by mouth 2 (two) times daily. 01/10/17  Yes Cassandria Anger, MD    Current Facility-Administered Medications  Medication Dose Route Frequency Provider Last Rate Last Dose  . 0.9 %  sodium chloride infusion   Intravenous Continuous Phillips Grout, MD 100 mL/hr at 04/24/17 2203    . ondansetron (ZOFRAN) tablet 4 mg  4 mg Oral Q6H PRN Phillips Grout, MD       Or  . ondansetron (ZOFRAN) injection 4 mg  4 mg Intravenous Q6H PRN Phillips Grout, MD      . pantoprazole (PROTONIX) injection 40 mg  40 mg Intravenous Q12H Derrill Kay A, MD   40 mg at 04/24/17 2220    Allergies as of 04/24/2017  . (No Known Allergies)    Family History  Problem Relation Age of Onset  . Hypertension Mother   . Hyperlipidemia Mother   . Hypertension Father   . Hyperlipidemia Father     Social History   Social History  . Marital status: Married    Spouse name: N/A  . Number of children: N/A  . Years of education: N/A   Occupational History  . Not on file.   Social History Main Topics  . Smoking status: Former Research scientist (life sciences)  . Smokeless tobacco: Never Used  . Alcohol use No  . Drug use: No  . Sexual activity: Not on file   Other Topics Concern  . Not on file  Social History Narrative  . No narrative on file    Review of Systems: General: Negative for anorexia, weight loss, fever, chills, fatigue, weakness. ENT: Negative for hoarseness, difficulty swallowing. CV: Negative for chest pain, angina, palpitations, peripheral edema.  Respiratory: Negative for dyspnea at rest, cough, sputum, wheezing.  GI: See history of present illness. MS: Negative for joint pain, low back pain.  Derm: Negative for rash or itching.  Endo: Negative for unusual weight change.  Heme: Negative for bruising or bleeding. Allergy: Negative for rash or  hives.  Physical Exam: Vital signs in last 24 hours: Temp:  [98.9 F (37.2 C)-99.5 F (37.5 C)] 98.9 F (37.2 C) (10/01 0506) Pulse Rate:  [63-102] 78 (10/01 0506) Resp:  [18] 18 (10/01 0506) BP: (117-153)/(70-80) 137/70 (10/01 0506) SpO2:  [97 %-100 %] 97 % (09/30 2120) Weight:  [242 lb 3.2 oz (109.9 kg)-250 lb (113.4 kg)] 242 lb 3.2 oz (109.9 kg) (09/30 2120) Last BM Date: 04/24/17 General:   Alert,  Well-developed, well-nourished, pleasant and cooperative in NAD Head:  Normocephalic and atraumatic. Eyes:  Sclera clear, no icterus. Conjunctiva pink. Ears:  Normal auditory acuity. Neck:  Supple; no masses or thyromegaly. Lungs:  Clear throughout to auscultation. No wheezes, crackles, or rhonchi. No acute distress. Heart:  Regular rate and rhythm; no murmurs, clicks, rubs,  or gallops. Abdomen:  Soft, nontender and nondistended. No masses, hepatosplenomegaly or hernias noted. Normal bowel sounds, without guarding, and without rebound.   Rectal:  Deferred.   Msk:  Symmetrical without gross deformities. Pulses:  Normal bilateral DP pulses noted. Extremities:  Without clubbing or edema. Neurologic:  Alert and  oriented x4;  grossly normal neurologically. Psych:  Alert and cooperative. Normal mood and affect.  Intake/Output from previous day: 09/30 0701 - 10/01 0700 In: 735 [P.O.:240; I.V.:495] Out: -  Intake/Output this shift: No intake/output data recorded.  Lab Results:  Recent Labs  04/24/17 1720 04/25/17 0321  WBC 11.5* 9.0  HGB 12.7* 11.3*  HCT 37.2* 33.9*  PLT 273 248   BMET  Recent Labs  04/24/17 1720 04/25/17 0321  NA 143 141  K 3.1* 3.6  CL 108 110  CO2 24 22  GLUCOSE 180* 140*  BUN 27* 29*  CREATININE 1.30* 1.24  CALCIUM 8.8* 8.0*   LFT No results for input(s): PROT, ALBUMIN, AST, ALT, ALKPHOS, BILITOT, BILIDIR, IBILI in the last 72 hours. PT/INR  Recent Labs  04/24/17 2016  LABPROT 15.3*  INR 1.22   Hepatitis Panel No results for  input(s): HEPBSAG, HCVAB, HEPAIGM, HEPBIGM in the last 72 hours. C-Diff No results for input(s): CDIFFTOX in the last 72 hours.  Studies/Results: No results found.  Impression: Pleasant 76 year old male admitted to the hospital through the emergency department for weakness. He previously was having significant diarrhea which resolved with symptomatic management by his primary care. His stools then evolved to a pasty, black stool which was found to be heme positive. He is hemoglobin was mildly depressed at 12.7 with a history of mild chronic renal insufficiency with a creatinine of 1.3 baseline. Overnight his hemoglobin declined to 11.3 which likely has some element of hydration effect. Has not had a bowel movement in the last 24 hours.  Today he states he feels much better, his strength has improved and is feeling "back to normal." He continues to decline endoscopic evaluation. Likely some element of minor upper GI bleeding. He is on a PPI and should likely remain on this until he follows up in our  office after discharge. We will plan to follow along as needed and endoscopic evaluation remains a possibility depending patient approval and a decline in condition.  Plan: 1. Continue PPI. 2. Monitor hemoglobin. 3. Monitor for further GI bleeding. 4. Can consider endoscopic evaluation if the patient's condition declines and he is agreeable. 5. Supportive measures. 6. Transfuse as needed   Thank you for allowing [place] to participate in the care of Jaclyn Shaggy, DNP, AGNP-C Adult & Gerontological Nurse Practitioner [facility]    LOS: 0 days     04/25/2017, 8:12 AM

## 2017-04-25 NOTE — Evaluation (Signed)
Physical Therapy Evaluation Patient Details Name: Kyle Hicks MRN: [id number] DOB: Apr 24, 1941 Today's Date: 04/25/2017   History of Present Illness  Kyle Hicks is a 76 y.o. male with medical history significant of HTN, DM comes in with over a week of loose stools.  Pt reports he went to his PCP last week for a lot of diarrhea.  No abdominal pain, no fevers.  Was provided symptomatic support (he reports no abx given).  His stooling has improved a lot but it is black appearing.  No bright red.  Again no abdominal pain.  No n/v.  No fevers at home.  He has been feeling very weak.  He came to ED for the weakness.  Has no h/o previous scoping in the past.  Pt found to have heme pos stool and was referred for admission for possible gib    Clinical Impression  Patient limited for functional mobility as stated below secondary to BLE weakness and fatigue.  Patient will benefit from continued physical therapy in hospital to increase strength, balance, endurance for safe ADLs and gait.    Follow Up Recommendations Supervision for mobility/OOB    Equipment Recommendations  None recommended by PT    Recommendations for Other Services       Precautions / Restrictions Precautions Precautions: None Restrictions Weight Bearing Restrictions: No      Mobility  Bed Mobility Overal bed mobility: Modified Independent                Transfers Overall transfer level: Needs assistance   Transfers: Sit to/from Stand;Stand Pivot Transfers Sit to Stand: Supervision Stand pivot transfers: Supervision          Ambulation/Gait Ambulation/Gait assistance: Supervision Ambulation Distance (Feet): 100 Feet Assistive device: None Gait Pattern/deviations: Decreased stride length   Gait velocity interpretation: Below normal speed for age/gender General Gait Details: slightly labored with slower than normal cadence, no loss of balance  Stairs            Wheelchair Mobility     Modified Rankin (Stroke Patients Only)       Balance Overall balance assessment: No apparent balance deficits (not formally assessed)                                           Pertinent Vitals/Pain Pain Assessment: No/denies pain    Home Living Family/patient expects to be discharged to:: Private residence Living Arrangements: Spouse/significant other Available Help at Discharge: Family Type of Home: House Home Access: Stairs to enter Entrance Stairs-Rails: Can reach both Entrance Stairs-Number of Steps: 3 Home Layout: Two level Home Equipment: None      Prior Function Level of Independence: Independent               Hand Dominance        Extremity/Trunk Assessment   Upper Extremity Assessment Upper Extremity Assessment: Overall WFL for tasks assessed    Lower Extremity Assessment Lower Extremity Assessment: Generalized weakness    Cervical / Trunk Assessment Cervical / Trunk Assessment: Normal  Communication   Communication: No difficulties  Cognition Arousal/Alertness: Awake/alert Behavior During Therapy: WFL for tasks assessed/performed Overall Cognitive Status: Within Functional Limits for tasks assessed  General Comments      Exercises     Assessment/Plan    PT Assessment Patient needs continued PT services  PT Problem List Decreased activity tolerance;Decreased mobility;Decreased strength       PT Treatment Interventions Gait training;Stair training;Functional mobility training;Therapeutic activities;Therapeutic exercise;Patient/family education    PT Goals (Current goals can be found in the Care Plan section)  Acute Rehab PT Goals Patient Stated Goal: return home PT Goal Formulation: With patient Time For Goal Achievement: 04/29/17 Potential to Achieve Goals: Good    Frequency Min 3X/week   Barriers to discharge        Co-evaluation                AM-PAC PT "6 Clicks" Daily Activity  Outcome Measure Difficulty turning over in bed (including adjusting bedclothes, sheets and blankets)?: None Difficulty moving from lying on back to sitting on the side of the bed? : None Difficulty sitting down on and standing up from a chair with arms (e.g., wheelchair, bedside commode, etc,.)?: None Help needed moving to and from a bed to chair (including a wheelchair)?: None Help needed walking in hospital room?: A Little Help needed climbing 3-5 steps with a railing? : A Little 6 Click Score: 22    End of Session   Activity Tolerance: Patient tolerated treatment well Patient left: in chair;with call bell/phone within reach Nurse Communication: Mobility status PT Visit Diagnosis: Unsteadiness on feet (R26.81);Other abnormalities of gait and mobility (R26.89);Muscle weakness (generalized) (M62.81)    Time: 1610-9604 PT Time Calculation (min) (ACUTE ONLY): 28 min   Charges:   PT Evaluation $PT Eval Low Complexity: 1 Low PT Treatments $Therapeutic Activity: 23-37 mins   PT G Codes:   PT G-Codes **NOT FOR INPATIENT CLASS** Functional Assessment Tool Used: AM-PAC 6 Clicks Basic Mobility Functional Limitation: Mobility: Walking and moving around Mobility: Walking and Moving Around Current Status ([id number]): At least 20 percent but less than 40 percent impaired, limited or restricted Mobility: Walking and Moving Around Goal Status [phone]): At least 20 percent but less than 40 percent impaired, limited or restricted Mobility: Walking and Moving Around Discharge Status [phone]): At least 20 percent but less than 40 percent impaired, limited or restricted    3:43 PM, 04/25/17 Lonell Grandchild, MPT Physical Therapist with [facility] [phone] office [phone] mobile phone

## 2017-04-26 DIAGNOSIS — E1122 Type 2 diabetes mellitus with diabetic chronic kidney disease: Secondary | ICD-10-CM | POA: Diagnosis not present

## 2017-04-26 DIAGNOSIS — K921 Melena: Secondary | ICD-10-CM

## 2017-04-26 DIAGNOSIS — N182 Chronic kidney disease, stage 2 (mild): Secondary | ICD-10-CM | POA: Diagnosis not present

## 2017-04-26 DIAGNOSIS — R748 Abnormal levels of other serum enzymes: Secondary | ICD-10-CM | POA: Diagnosis not present

## 2017-04-26 DIAGNOSIS — E1165 Type 2 diabetes mellitus with hyperglycemia: Secondary | ICD-10-CM | POA: Diagnosis not present

## 2017-04-26 DIAGNOSIS — R195 Other fecal abnormalities: Secondary | ICD-10-CM | POA: Diagnosis not present

## 2017-04-26 DIAGNOSIS — R531 Weakness: Secondary | ICD-10-CM | POA: Diagnosis not present

## 2017-04-26 LAB — CBC
HCT: 32.8 % — ABNORMAL LOW (ref 39.0–52.0)
Hemoglobin: 10.7 g/dL — ABNORMAL LOW (ref 13.0–17.0)
MCH: 29.2 pg (ref 26.0–34.0)
MCHC: 32.6 g/dL (ref 30.0–36.0)
MCV: 89.6 fL (ref 78.0–100.0)
Platelets: 223 10*3/uL (ref 150–400)
RBC: 3.66 MIL/uL — AB (ref 4.22–5.81)
RDW: 13.7 % (ref 11.5–15.5)
WBC: 7.6 10*3/uL (ref 4.0–10.5)

## 2017-04-26 LAB — MAGNESIUM: MAGNESIUM: 1.8 mg/dL (ref 1.7–2.4)

## 2017-04-26 LAB — BASIC METABOLIC PANEL
ANION GAP: 7 (ref 5–15)
BUN: 23 mg/dL — ABNORMAL HIGH (ref 6–20)
CO2: 23 mmol/L (ref 22–32)
Calcium: 7.9 mg/dL — ABNORMAL LOW (ref 8.9–10.3)
Chloride: 109 mmol/L (ref 101–111)
Creatinine, Ser: 1.28 mg/dL — ABNORMAL HIGH (ref 0.61–1.24)
GFR, EST NON AFRICAN AMERICAN: 53 mL/min — AB (ref >60)
GLUCOSE: 146 mg/dL — AB (ref 65–99)
POTASSIUM: 3.7 mmol/L (ref 3.5–5.1)
SODIUM: 139 mmol/L (ref 135–145)

## 2017-04-26 LAB — IRON AND TIBC
IRON: 39 ug/dL — AB (ref 45–182)
SATURATION RATIOS: 24 % (ref 17.9–39.5)
TIBC: 162 ug/dL — AB (ref 250–450)
UIBC: 123 ug/dL

## 2017-04-26 LAB — FERRITIN: Ferritin: 602 ng/mL — ABNORMAL HIGH (ref 24–336)

## 2017-04-26 MED ORDER — PANTOPRAZOLE SODIUM 40 MG PO TBEC: 40.0000 mg | DELAYED_RELEASE_TABLET | Freq: Two times a day (BID) | ORAL | 1 refills | Status: DC

## 2017-04-26 NOTE — Progress Notes (Signed)
Physical Therapy Treatment Patient Details Name: BURLEY KOPKA MRN: [id number] DOB: March 31, 1941 Today's Date: 04/26/2017    History of Present Illness Kyle Hicks is a 76 y.o. male with medical history significant of HTN, DM comes in with over a week of loose stools.  Pt reports he went to his PCP last week for a lot of diarrhea.  No abdominal pain, no fevers.  Was provided symptomatic support (he reports no abx given).  His stooling has improved a lot but it is black appearing.  No bright red.  Again no abdominal pain.  No n/v.  No fevers at home.  He has been feeling very weak.  He came to ED for the weakness.  Has no h/o previous scoping in the past.  Pt found to have heme pos stool and was referred for admission for possible gib    PT Comments    Patient demonstrates good return for walking in hallways and going up/down stairs using 1 siderail without loss of balance.  PLAN: Discharge physical therapy, patient discharged to care of nursing for ambulation daily as tolerated and patient encouraged to sit up in chair daily.    Follow Up Recommendations  No PT follow up     Equipment Recommendations  None recommended by PT    Recommendations for Other Services       Precautions / Restrictions Precautions Precautions: None Restrictions Weight Bearing Restrictions: No    Mobility  Bed Mobility Overal bed mobility: Independent                Transfers Overall transfer level: Independent Equipment used: None                Ambulation/Gait Ambulation/Gait assistance: Independent Ambulation Distance (Feet): 150 Feet Assistive device: None Gait Pattern/deviations: WFL(Within Functional Limits)         Stairs Stairs: Yes   Stair Management: One rail Right;One rail Left Number of Stairs: 9 General stair comments: good return for using 1 siderail to go up/down stairs without loss of balance  Wheelchair Mobility    Modified Rankin (Stroke Patients  Only)       Balance Overall balance assessment: No apparent balance deficits (not formally assessed)                                          Cognition Arousal/Alertness: Awake/alert Behavior During Therapy: WFL for tasks assessed/performed Overall Cognitive Status: Within Functional Limits for tasks assessed                                        Exercises General Exercises - Lower Extremity Ankle Circles/Pumps: Seated;AROM;10 reps;Both Long Arc Quad: Seated;AROM;Both;10 reps Hip Flexion/Marching: Seated;AROM;Both;10 reps    General Comments        Pertinent Vitals/Pain Pain Assessment: Faces Pain Score: 2  Faces Pain Scale: Hurts a little bit Pain Location: stomach Pain Descriptors / Indicators: Aching    Home Living                      Prior Function            PT Goals (current goals can now be found in the care plan section) Acute Rehab PT Goals Patient Stated Goal: return home PT Goal Formulation: With  patient Time For Goal Achievement: 04/26/17 Potential to Achieve Goals: Good Progress towards PT goals: Progressing toward goals    Frequency           PT Plan Discharge plan needs to be updated    Co-evaluation              AM-PAC PT "6 Clicks" Daily Activity  Outcome Measure  Difficulty turning over in bed (including adjusting bedclothes, sheets and blankets)?: None Difficulty moving from lying on back to sitting on the side of the bed? : None Difficulty sitting down on and standing up from a chair with arms (e.g., wheelchair, bedside commode, etc,.)?: None Help needed moving to and from a bed to chair (including a wheelchair)?: None Help needed walking in hospital room?: None Help needed climbing 3-5 steps with a railing? : None 6 Click Score: 24    End of Session   Activity Tolerance: Patient tolerated treatment well Patient left: in chair;with call bell/phone within reach;with  family/visitor present Nurse Communication: Mobility status PT Visit Diagnosis: Unsteadiness on feet (R26.81);Other abnormalities of gait and mobility (R26.89);Muscle weakness (generalized) (M62.81)     Time: 1007-1219 PT Time Calculation (min) (ACUTE ONLY): 23 min  Charges:  $Gait Training: 23-37 mins                    G Codes:  Functional Assessment Tool Used: AM-PAC 6 Clicks Basic Mobility Functional Limitation: Mobility: Walking and moving around Mobility: Walking and Moving Around Current Status (X5883): 0 percent impaired, limited or restricted Mobility: Walking and Moving Around Goal Status (G5498): 0 percent impaired, limited or restricted Mobility: Walking and Moving Around Discharge Status [phone]): 0 percent impaired, limited or restricted    12:00 PM, 04/26/17 Lonell Grandchild, MPT Physical Therapist with [facility] [phone] office [phone] mobile phone

## 2017-04-26 NOTE — Progress Notes (Signed)
REVIEWED-NO ADDITIONAL RECOMMENDATIONS.   Subjective: States small dark stool this morning, mixed with brown. While in patient's room, he had another medium-sized loose, black stool that I witnessed. No abdominal pain. Continues to decline EGD.   Objective: Vital signs in last 24 hours: Temp:  [98.7 F (37.1 C)-98.9 F (37.2 C)] 98.7 F (37.1 C) (10/02 0509) Pulse Rate:  [68-78] 77 (10/02 0509) Resp:  [16-20] 16 (10/02 0509) BP: (117-146)/(69-76) 137/76 (10/02 0509) SpO2:  [95 %-100 %] 100 % (10/02 0509) Last BM Date: 04/26/17 General:   Alert and oriented, pleasant Head:  Normocephalic and atraumatic. Abdomen:  Bowel sounds present, round but soft, non-tender. No HSM or hernias noted.  Neurologic:  Alert and  oriented x4 Psych:  Alert and cooperative. Normal mood and affect.  Intake/Output from previous day: 10/01 0701 - 10/02 0700 In: 960 [P.O.:960] Out: 600 [Urine:600] Intake/Output this shift: Total I/O In: -  Out: 100 [Urine:100]  Lab Results:  Recent Labs  04/24/17 1720 04/25/17 0321 04/26/17 0452  WBC 11.5* 9.0 7.6  HGB 12.7* 11.3* 10.7*  HCT 37.2* 33.9* 32.8*  PLT 273 248 223   BMET  Recent Labs  04/24/17 1720 04/25/17 0321 04/26/17 0452  NA 143 141 139  K 3.1* 3.6 3.7  CL 108 110 109  CO2 24 22 23   GLUCOSE 180* 140* 146*  BUN 27* 29* 23*  CREATININE 1.30* 1.24 1.28*  CALCIUM 8.8* 8.0* 7.9*   PT/INR  Recent Labs  04/24/17 2016  LABPROT 15.3*  INR 1.22    Assessment: 76 year old male with melena and heme positive stool, declining endoscopic evaluation. Prior to admission was on aspirin without PPI. Hgb 10.7 today, down 2 grams over past 2 days. Iron studies in process. Has had 2 dark stools this morning, one that was witnessed. Continue to decline endoscopic evaluation. We will sign off for now, as he does not want to pursue GI evaluation. Please let us know if we can assist further or if patient changes his mind.   Plan: PPI BID Avoid  NSAIDs Will sign off as he does not desire endoscopic evaluation  Annitta Needs, PhD, ANP-BC [facility]     LOS: 0 days    04/26/2017, 7:50 AM

## 2017-04-26 NOTE — Progress Notes (Signed)
Patient discharged home.  IV removed - WNL.  Reviewed DC instructions and medications.  Instructed to follow up with PCP in 1-2 weeks and to report any  Stools with bright red blood.  Advised to avoid NSAIDs and to take protonix as prescribed to prevent further bleeding.  Verbalizes understanding.  No questions at this time. Patient in NAD.

## 2017-04-27 ENCOUNTER — Inpatient Hospital Stay (HOSPITAL_COMMUNITY)
Admission: EM | Admit: 2017-04-27 | Discharge: 2017-04-30 | DRG: 378 | Disposition: A | Discharge status: Home / Self Care | Payer: Medicare Other | Attending: Internal Medicine | Admitting: Internal Medicine

## 2017-04-27 ENCOUNTER — Encounter (HOSPITAL_COMMUNITY)
Admission: EM | Disposition: A | Discharge status: Home / Self Care | Payer: Self-pay | Source: Home / Self Care | Attending: Internal Medicine

## 2017-04-27 ENCOUNTER — Encounter (HOSPITAL_COMMUNITY): Payer: Self-pay | Admitting: *Deleted

## 2017-04-27 DIAGNOSIS — I129 Hypertensive chronic kidney disease with stage 1 through stage 4 chronic kidney disease, or unspecified chronic kidney disease: Secondary | ICD-10-CM | POA: Diagnosis present

## 2017-04-27 DIAGNOSIS — E1165 Type 2 diabetes mellitus with hyperglycemia: Secondary | ICD-10-CM | POA: Diagnosis present

## 2017-04-27 DIAGNOSIS — E782 Mixed hyperlipidemia: Secondary | ICD-10-CM | POA: Diagnosis present

## 2017-04-27 DIAGNOSIS — K766 Portal hypertension: Secondary | ICD-10-CM | POA: Diagnosis present

## 2017-04-27 DIAGNOSIS — E785 Hyperlipidemia, unspecified: Secondary | ICD-10-CM | POA: Diagnosis present

## 2017-04-27 DIAGNOSIS — N182 Chronic kidney disease, stage 2 (mild): Secondary | ICD-10-CM | POA: Diagnosis present

## 2017-04-27 DIAGNOSIS — K3189 Other diseases of stomach and duodenum: Secondary | ICD-10-CM

## 2017-04-27 DIAGNOSIS — Z6835 Body mass index (BMI) 35.0-35.9, adult: Secondary | ICD-10-CM

## 2017-04-27 DIAGNOSIS — K264 Chronic or unspecified duodenal ulcer with hemorrhage: Secondary | ICD-10-CM | POA: Diagnosis present

## 2017-04-27 DIAGNOSIS — K922 Gastrointestinal hemorrhage, unspecified: Secondary | ICD-10-CM

## 2017-04-27 DIAGNOSIS — K228 Other specified diseases of esophagus: Secondary | ICD-10-CM | POA: Diagnosis not present

## 2017-04-27 DIAGNOSIS — D649 Anemia, unspecified: Secondary | ICD-10-CM

## 2017-04-27 DIAGNOSIS — Z7984 Long term (current) use of oral hypoglycemic drugs: Secondary | ICD-10-CM

## 2017-04-27 DIAGNOSIS — Z87891 Personal history of nicotine dependence: Secondary | ICD-10-CM

## 2017-04-27 DIAGNOSIS — K269 Duodenal ulcer, unspecified as acute or chronic, without hemorrhage or perforation: Secondary | ICD-10-CM

## 2017-04-27 DIAGNOSIS — D62 Acute posthemorrhagic anemia: Secondary | ICD-10-CM | POA: Diagnosis present

## 2017-04-27 DIAGNOSIS — R58 Hemorrhage, not elsewhere classified: Secondary | ICD-10-CM | POA: Diagnosis not present

## 2017-04-27 DIAGNOSIS — K625 Hemorrhage of anus and rectum: Secondary | ICD-10-CM | POA: Diagnosis present

## 2017-04-27 DIAGNOSIS — I959 Hypotension, unspecified: Secondary | ICD-10-CM | POA: Diagnosis present

## 2017-04-27 DIAGNOSIS — I10 Essential (primary) hypertension: Secondary | ICD-10-CM | POA: Diagnosis not present

## 2017-04-27 DIAGNOSIS — K8020 Calculus of gallbladder without cholecystitis without obstruction: Secondary | ICD-10-CM | POA: Diagnosis not present

## 2017-04-27 DIAGNOSIS — IMO0002 Reserved for concepts with insufficient information to code with codable children: Secondary | ICD-10-CM | POA: Diagnosis present

## 2017-04-27 DIAGNOSIS — Z7982 Long term (current) use of aspirin: Secondary | ICD-10-CM | POA: Diagnosis not present

## 2017-04-27 DIAGNOSIS — E1122 Type 2 diabetes mellitus with diabetic chronic kidney disease: Secondary | ICD-10-CM | POA: Diagnosis present

## 2017-04-27 HISTORY — PX: ESOPHAGOGASTRODUODENOSCOPY: SHX5428

## 2017-04-27 LAB — CBC WITH DIFFERENTIAL/PLATELET
Basophils Absolute: 0 10*3/uL (ref 0.0–0.1)
Basophils Relative: 0 %
EOS ABS: 0.1 10*3/uL (ref 0.0–0.7)
Eosinophils Relative: 0 %
HEMATOCRIT: 26.6 % — AB (ref 39.0–52.0)
HEMOGLOBIN: 8.9 g/dL — AB (ref 13.0–17.0)
LYMPHS ABS: 1.1 10*3/uL (ref 0.7–4.0)
Lymphocytes Relative: 7 %
MCH: 29.6 pg (ref 26.0–34.0)
MCHC: 33.5 g/dL (ref 30.0–36.0)
MCV: 88.4 fL (ref 78.0–100.0)
MONOS PCT: 4 %
Monocytes Absolute: 0.6 10*3/uL (ref 0.1–1.0)
NEUTROS PCT: 89 %
Neutro Abs: 13.7 10*3/uL — ABNORMAL HIGH (ref 1.7–7.7)
Platelets: 274 10*3/uL (ref 150–400)
RBC: 3.01 MIL/uL — ABNORMAL LOW (ref 4.22–5.81)
RDW: 13.6 % (ref 11.5–15.5)
WBC: 15.5 10*3/uL — ABNORMAL HIGH (ref 4.0–10.5)

## 2017-04-27 LAB — GLUCOSE, CAPILLARY
GLUCOSE-CAPILLARY: 154 mg/dL — AB (ref 65–99)
Glucose-Capillary: 132 mg/dL — ABNORMAL HIGH (ref 65–99)

## 2017-04-27 LAB — POC OCCULT BLOOD, ED: Fecal Occult Bld: POSITIVE — AB

## 2017-04-27 LAB — CBC
HEMATOCRIT: 27.4 % — AB (ref 39.0–52.0)
HEMOGLOBIN: 9 g/dL — AB (ref 13.0–17.0)
MCH: 28.9 pg (ref 26.0–34.0)
MCHC: 32.8 g/dL (ref 30.0–36.0)
MCV: 88.1 fL (ref 78.0–100.0)
Platelets: 208 10*3/uL (ref 150–400)
RBC: 3.11 MIL/uL — ABNORMAL LOW (ref 4.22–5.81)
RDW: 14.1 % (ref 11.5–15.5)
WBC: 9.1 10*3/uL (ref 4.0–10.5)

## 2017-04-27 LAB — COMPREHENSIVE METABOLIC PANEL
ALT: 35 U/L (ref 17–63)
ANION GAP: 10 (ref 5–15)
AST: 22 U/L (ref 15–41)
Albumin: 2.5 g/dL — ABNORMAL LOW (ref 3.5–5.0)
Alkaline Phosphatase: 86 U/L (ref 38–126)
BILIRUBIN TOTAL: 0.4 mg/dL (ref 0.3–1.2)
BUN: 31 mg/dL — ABNORMAL HIGH (ref 6–20)
CO2: 18 mmol/L — ABNORMAL LOW (ref 22–32)
Calcium: 7.8 mg/dL — ABNORMAL LOW (ref 8.9–10.3)
Chloride: 110 mmol/L (ref 101–111)
Creatinine, Ser: 1.63 mg/dL — ABNORMAL HIGH (ref 0.61–1.24)
GFR, EST AFRICAN AMERICAN: 46 mL/min — AB (ref >60)
GFR, EST NON AFRICAN AMERICAN: 40 mL/min — AB (ref >60)
Glucose, Bld: 247 mg/dL — ABNORMAL HIGH (ref 65–99)
POTASSIUM: 3.9 mmol/L (ref 3.5–5.1)
SODIUM: 138 mmol/L (ref 135–145)
TOTAL PROTEIN: 5.7 g/dL — AB (ref 6.5–8.1)

## 2017-04-27 LAB — HEMOGLOBIN AND HEMATOCRIT, BLOOD
HCT: 27.8 % — ABNORMAL LOW (ref 39.0–52.0)
HEMOGLOBIN: 9.3 g/dL — AB (ref 13.0–17.0)

## 2017-04-27 LAB — PREPARE RBC (CROSSMATCH)

## 2017-04-27 SURGERY — EGD (ESOPHAGOGASTRODUODENOSCOPY)
Anesthesia: Moderate Sedation

## 2017-04-27 MED ORDER — INSULIN ASPART 100 UNIT/ML ~~LOC~~ SOLN
0.0000 [IU] | Freq: Three times a day (TID) | SUBCUTANEOUS | Status: DC
  Administered 2017-04-28 – 2017-04-29 (×3): 1 [IU] via SUBCUTANEOUS

## 2017-04-27 MED ORDER — SUCRALFATE 1 GM/10ML PO SUSP
1.0000 g | Freq: Three times a day (TID) | ORAL | Status: DC
  Administered 2017-04-27 – 2017-04-30 (×10): 1 g via ORAL
  Filled 2017-04-27 (×9): qty 10

## 2017-04-27 MED ORDER — LIDOCAINE VISCOUS 2 % MT SOLN
OROMUCOSAL | Status: DC | PRN
  Administered 2017-04-27: 1 via OROMUCOSAL

## 2017-04-27 MED ORDER — SIMETHICONE 40 MG/0.6ML PO SUSP
ORAL | Status: DC | PRN
  Administered 2017-04-27: 2.5 mL

## 2017-04-27 MED ORDER — MIDAZOLAM HCL 5 MG/5ML IJ SOLN
INTRAMUSCULAR | Status: DC | PRN
  Administered 2017-04-27 (×2): 2 mg via INTRAVENOUS
  Administered 2017-04-27: 1 mg via INTRAVENOUS

## 2017-04-27 MED ORDER — MEPERIDINE HCL 50 MG/ML IJ SOLN
INTRAMUSCULAR | Status: AC
  Filled 2017-04-27: qty 1

## 2017-04-27 MED ORDER — SODIUM CHLORIDE 0.9 % IV SOLN
8.0000 mg/h | INTRAVENOUS | Status: DC
  Administered 2017-04-27 – 2017-04-29 (×3): 8 mg/h via INTRAVENOUS
  Filled 2017-04-27 (×8): qty 80

## 2017-04-27 MED ORDER — SODIUM CHLORIDE 0.9 % IV SOLN: Freq: Once | INTRAVENOUS | Status: DC

## 2017-04-27 MED ORDER — ONDANSETRON HCL 4 MG PO TABS: 4.0000 mg | ORAL_TABLET | Freq: Four times a day (QID) | ORAL | Status: DC | PRN

## 2017-04-27 MED ORDER — ONDANSETRON HCL 4 MG/2ML IJ SOLN: 4.0000 mg | Freq: Four times a day (QID) | INTRAMUSCULAR | Status: DC | PRN

## 2017-04-27 MED ORDER — PANTOPRAZOLE SODIUM 40 MG IV SOLR
40.0000 mg | Freq: Two times a day (BID) | INTRAVENOUS | Status: DC
  Administered 2017-04-27: 40 mg via INTRAVENOUS
  Filled 2017-04-27: qty 40

## 2017-04-27 MED ORDER — MEPERIDINE HCL 50 MG/ML IJ SOLN
INTRAMUSCULAR | Status: DC | PRN
  Administered 2017-04-27: 25 mg via INTRAVENOUS

## 2017-04-27 MED ORDER — PANTOPRAZOLE SODIUM 40 MG IV SOLR: 40.0000 mg | Freq: Two times a day (BID) | INTRAVENOUS | Status: DC

## 2017-04-27 MED ORDER — ACETAMINOPHEN 325 MG PO TABS: 650.0000 mg | ORAL_TABLET | Freq: Four times a day (QID) | ORAL | Status: DC | PRN

## 2017-04-27 MED ORDER — SODIUM CHLORIDE 0.9 % IV BOLUS (SEPSIS)
1000.0000 mL | Freq: Once | INTRAVENOUS | Status: AC
  Administered 2017-04-27: 1000 mL via INTRAVENOUS

## 2017-04-27 MED ORDER — SODIUM CHLORIDE 0.9 % IV SOLN: INTRAVENOUS | Status: DC

## 2017-04-27 MED ORDER — SODIUM CHLORIDE 0.9 % IV SOLN
INTRAVENOUS | Status: DC
  Administered 2017-04-27 – 2017-04-30 (×5): via INTRAVENOUS

## 2017-04-27 MED ORDER — ACETAMINOPHEN 650 MG RE SUPP: 650.0000 mg | Freq: Four times a day (QID) | RECTAL | Status: DC | PRN

## 2017-04-27 MED ORDER — LIDOCAINE VISCOUS 2 % MT SOLN
OROMUCOSAL | Status: AC
  Filled 2017-04-27: qty 15

## 2017-04-27 MED ORDER — MIDAZOLAM HCL 5 MG/5ML IJ SOLN
INTRAMUSCULAR | Status: AC
Start: 2017-04-27 — End: 2017-04-28
  Filled 2017-04-27: qty 10

## 2017-04-27 MED ORDER — PANTOPRAZOLE SODIUM 40 MG IV SOLR
INTRAVENOUS | Status: AC
  Filled 2017-04-27: qty 80

## 2017-04-27 MED ORDER — INSULIN ASPART 100 UNIT/ML ~~LOC~~ SOLN: 0.0000 [IU] | Freq: Every day | SUBCUTANEOUS | Status: DC

## 2017-04-27 NOTE — Consult Note (Signed)
Reason for Consult rectal bleeding.  Referring Physician: Hospitalist MARKHI KLECKNER is an 76 y.o. male.  HPI: Admitted thru the ED this am with BRRB. States bleeding started this morning at home. Became weak and wife call rescue squad.  Hemoglobin in the ED 8.9.  CBC yesterday; was 10.7. Patient was discharge yesterday from [facility] yesterday with melena. He refused  EGD during admission. Had been having black stools for about a week. [facility] was consulted.   Has been on low dose ASA. No ASA for over a week He is receiving a unit of PRBCs this pm His last colonoscopy was greater than 10 yrs ago. No abdominal pain  Past Medical History:  Diagnosis Date  . Diabetes mellitus without complication ([place])   . HTN (hypertension)     History reviewed. No pertinent surgical history.  Family History  Problem Relation Age of Onset  . Hypertension Mother   . Hyperlipidemia Mother   . Hypertension Father   . Hyperlipidemia Father     Social History:  reports that he has quit smoking. He has never used smokeless tobacco. He reports that he does not drink alcohol or use drugs.  Allergies: No Known Allergies  Medications: I have reviewed the patient's current medications.  Results for orders placed or performed during the hospital encounter of 04/27/17 (from the past 48 hour(s))  CBC with Differential     Status: Abnormal   Collection Time: 04/27/17  9:25 AM  Result Value Ref Range   WBC 15.5 (H) 4.0 - 10.5 K/uL   RBC 3.01 (L) 4.22 - 5.81 MIL/uL   Hemoglobin 8.9 (L) 13.0 - 17.0 g/dL   HCT 26.6 (L) 39.0 - 52.0 %   MCV 88.4 78.0 - 100.0 fL   MCH 29.6 26.0 - 34.0 pg   MCHC 33.5 30.0 - 36.0 g/dL   RDW 13.6 11.5 - 15.5 %   Platelets 274 150 - 400 K/uL   Neutrophils Relative % 89 %   Neutro Abs 13.7 (H) 1.7 - 7.7 K/uL   Lymphocytes Relative 7 %   Lymphs Abs 1.1 0.7 - 4.0 K/uL   Monocytes Relative 4 %   Monocytes Absolute 0.6 0.1 - 1.0 K/uL   Eosinophils Relative 0 %   Eosinophils  Absolute 0.1 0.0 - 0.7 K/uL   Basophils Relative 0 %   Basophils Absolute 0.0 0.0 - 0.1 K/uL  Comprehensive metabolic panel     Status: Abnormal   Collection Time: 04/27/17  9:25 AM  Result Value Ref Range   Sodium 138 135 - 145 mmol/L   Potassium 3.9 3.5 - 5.1 mmol/L   Chloride 110 101 - 111 mmol/L   CO2 18 (L) 22 - 32 mmol/L   Glucose, Bld 247 (H) 65 - 99 mg/dL   BUN 31 (H) 6 - 20 mg/dL   Creatinine, Ser 1.63 (H) 0.61 - 1.24 mg/dL   Calcium 7.8 (L) 8.9 - 10.3 mg/dL   Total Protein 5.7 (L) 6.5 - 8.1 g/dL   Albumin 2.5 (L) 3.5 - 5.0 g/dL   AST 22 15 - 41 U/L   ALT 35 17 - 63 U/L   Alkaline Phosphatase 86 38 - 126 U/L   Total Bilirubin 0.4 0.3 - 1.2 mg/dL   GFR calc non Af Amer 40 (L) >60 mL/min   GFR calc Af Amer 46 (L) >60 mL/min    Comment: (NOTE) The eGFR has been calculated using the CKD EPI equation. This calculation has not been validated  in all clinical situations. eGFR's persistently <60 mL/min signify possible Chronic Kidney Disease.    Anion gap 10 5 - 15  Type and screen [facility]     Status: None (Preliminary result)   Collection Time: 04/27/17  9:26 AM  Result Value Ref Range   ABO/RH(D) B POS    Antibody Screen NEG    Sample Expiration 04/30/2017    Unit Number [id number]    Blood Component Type RED CELLS,LR    Unit division 00    Status of Unit ALLOCATED    Transfusion Status OK TO TRANSFUSE    Crossmatch Result Compatible    Unit Number [id number]    Blood Component Type RED CELLS,LR    Unit division 00    Status of Unit ISSUED    Transfusion Status OK TO TRANSFUSE    Crossmatch Result Compatible   POC occult blood, ED Provider will collect     Status: Abnormal   Collection Time: 04/27/17  9:37 AM  Result Value Ref Range   Fecal Occult Bld POSITIVE (A) NEGATIVE  Prepare RBC     Status: None   Collection Time: 04/27/17  9:37 AM  Result Value Ref Range   Order Confirmation ORDER PROCESSED BY BLOOD BANK     No results  found.  ROS Blood pressure 103/67, pulse (!) 115, temperature 98.8 F (37.1 C), temperature source Oral, resp. rate 20, height 5' 10"  (1.778 m), weight 242 lb (109.8 kg), SpO2 100 %. Physical Exam Alert and oriented. Skin warm and dry. Oral mucosa is moist.   . Sclera anicteric, conjunctivae is pink. Thyroid not enlarged. No cervical lymphadenopathy. Lungs clear. Heart regular rate and rhythm.  Abdomen is soft. Bowel sounds are positive. Liver is enlarged. No abdominal masses felt. No tenderness.   Assessment/Plan: Lower GI bleed. Will discuss with Dr. Lindi Adie W 04/27/2017, 12:05 PM

## 2017-04-27 NOTE — ED Provider Notes (Signed)
[facility] DEPT Provider Note   CSN: [id number] Arrival date & time: 04/27/17  0859     History   Chief Complaint Chief Complaint  Patient presents with  . Rectal Bleeding    HPI MICHAELANGELO MITTELMAN is a 76 y.o. male.  Level V caveat for urgent need for intervention. Patient was discharged from the hospital on 04/25/17 secondary to admission for weakness, change in bowel habits "toothpaste like, black", heme positive stool.  He refused any invasive GI procedures at that time.  He returns today with bright lead blood per rectum starting this morning. He generally feels weak. No chest pain or dyspnea.      Past Medical History:  Diagnosis Date  . Diabetes mellitus without complication ([place])   . HTN (hypertension)     Patient Active Problem List   Diagnosis Date Noted  . 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 Melena 04/25/2017  . Hypomagnesemia 04/25/2017  . Chronic renal insufficiency, stage 2 (mild)   . GIB (gastrointestinal bleeding) 04/24/2017  . Elevated troponin 04/24/2017  . Weakness generalized 04/24/2017  . Heme positive stool   . Hyperlipidemia 06/24/2015  . Encephalopathy acute 02/23/2013  . Acute on chronic renal failure ([place]) 02/23/2013  . Morbid obesity ([place]) 02/23/2013  . Uncontrolled type 2 diabetes mellitus with stage 2 chronic kidney disease ([place])   . HTN (hypertension)     History reviewed. No pertinent surgical history.     Home Medications    Prior to Admission medications   Medication Sig Start Date End Date Taking? Authorizing Provider  aspirin EC 81 MG tablet Take 81 mg by mouth daily.   Yes [provider]  atorvastatin (LIPITOR) 20 MG tablet Take 20 mg by mouth daily.   Yes [provider]  hydrochlorothiazide (HYDRODIURIL) 25 MG tablet Take 25 mg by mouth daily.   Yes [provider]  NIFEdipine (PROCARDIA-XL/ADALAT-CC/NIFEDICAL-XL) 30 MG 24 hr tablet take 1 tablet by mouth once daily 02/14/17  Yes  Nida, Marella Chimes, MD  pantoprazole (PROTONIX) 40 MG tablet Take 1 tablet (40 mg total) by mouth 2 (two) times daily. 04/26/17 04/26/18 Yes Tat, Shanon Brow, MD  ramipril (ALTACE) 10 MG capsule take 1 capsule by mouth once daily 02/23/17  Yes Nida, Marella Chimes, MD  sitaGLIPtin-metformin (JANUMET) 50-1000 MG tablet Take 0.5 tablets by mouth 2 (two) times daily. 01/10/17  Yes Cassandria Anger, MD    Family History Family History  Problem Relation Age of Onset  . Hypertension Mother   . Hyperlipidemia Mother   . Hypertension Father   . Hyperlipidemia Father     Social History Social History  Substance Use Topics  . Smoking status: Former Research scientist (life sciences)  . Smokeless tobacco: Never Used  . Alcohol use No     Allergies   Patient has no known allergies.   Review of Systems Review of Systems  Unable to perform ROS: Acuity of condition     Physical Exam Updated Vital Signs BP 120/73   Pulse (!) 111   Temp 98.5 F (36.9 C) (Oral)   Resp (!) 21   Ht 5\' 10"  (1.778 m)   Wt 109.8 kg (242 lb)   SpO2 99%   BMI 34.72 kg/m   Physical Exam  Constitutional: He is oriented to person, place, and time.  No acute distress; hypotensive; tachycardic  HENT:  Head: Normocephalic and atraumatic.  Eyes: Conjunctivae are normal.  Neck: Neck supple.  Cardiovascular: Normal rate and regular rhythm.  Pulmonary/Chest: Effort normal and breath sounds normal.  Abdominal: Soft. Bowel sounds are normal.  Genitourinary:  Genitourinary Comments: Rectal exam: No masses, red blood, heme positive  Musculoskeletal: Normal range of motion.  Neurological: He is alert and oriented to person, place, and time.  Skin: Skin is warm and dry.  Psychiatric: He has a normal mood and affect. His behavior is normal.  Nursing note and vitals reviewed.    ED Treatments / Results  Labs (all labs ordered are listed, but only abnormal results are displayed) Labs Reviewed  CBC WITH DIFFERENTIAL/PLATELET -  Abnormal; Notable for the following:       Result Value   WBC 15.5 (*)    RBC 3.01 (*)    Hemoglobin 8.9 (*)    HCT 26.6 (*)    Neutro Abs 13.7 (*)    All other components within normal limits  COMPREHENSIVE METABOLIC PANEL - Abnormal; Notable for the following:    CO2 18 (*)    Glucose, Bld 247 (*)    BUN 31 (*)    Creatinine, Ser 1.63 (*)    Calcium 7.8 (*)    Total Protein 5.7 (*)    Albumin 2.5 (*)    GFR calc non Af Amer 40 (*)    GFR calc Af Amer 46 (*)    All other components within normal limits  POC OCCULT BLOOD, ED - Abnormal; Notable for the following:    Fecal Occult Bld POSITIVE (*)    All other components within normal limits  TYPE AND SCREEN  PREPARE RBC (CROSSMATCH)    EKG  EKG Interpretation  Date/Time:  Wednesday April 27 2017 09:03:17 EDT Ventricular Rate:  120 PR Interval:    QRS Duration: 131 QT Interval:  342 QTC Calculation: 484 R Axis:   -94 Text Interpretation:  Sinus tachycardia Ventricular premature complex Consider left atrial enlargement RBBB and LAFB LVH by voltage Borderline ST elevation, lateral leads Confirmed by Nat Christen [phone]) on 04/27/2017 9:48:56 AM Also confirmed by Nat Christen [phone]  on 04/27/2017 9:49:37 AM       Radiology No results found.  Procedures Procedures (including critical care time)  Medications Ordered in ED Medications  0.9 %  sodium chloride infusion (not administered)  sodium chloride 0.9 % bolus 1,000 mL (1,000 mLs Intravenous New Bag/Given 04/27/17 0932)     Initial Impression / Assessment and Plan / ED Course  I have reviewed the triage vital signs and the nursing notes.  Pertinent labs & imaging results that were available during my care of the patient were reviewed by me and considered in my medical decision making (see chart for details).     Patient presents with bright red blood per rectum.  He was initially hypotensive and tachycardic, but his vital signs have improved with fluids. His  hemoglobin today is 8.9. Yesterday it was 10.7. Will consult gastroenterology. Admit to general medicine. One unit of blood ordered.    CRITICAL CARE Performed by: Nat Christen Total critical care time: 30 minutes Critical care time was exclusive of separately billable procedures and treating other patients. Critical care was necessary to treat or prevent imminent or life-threatening deterioration. Critical care was time spent personally by me on the following activities: development of treatment plan with patient and/or surrogate as well as nursing, discussions with consultants, evaluation of patient's response to treatment, examination of patient, obtaining history from patient or surrogate, ordering and performing treatments and interventions, ordering and review of laboratory studies, ordering and review  of radiographic studies, pulse oximetry and re-evaluation of patient's condition. Final Clinical Impressions(s) / ED Diagnoses   Final diagnoses:  Lower GI bleed  Anemia, unspecified type    New Prescriptions New Prescriptions   No medications on file     Nat Christen, MD 04/27/17 1019

## 2017-04-27 NOTE — Progress Notes (Signed)
Previous EEG note:  Portal gastropathy. Extrinsic impression at gastric antrum possibly due to enlarged liver. 15 mm distal bulbar ulcer without stigmata of bleed.  Change pantoprazole to infusion. Sucralfate by mouth. Abdominal CT with contrast on 04/29/2017.

## 2017-04-27 NOTE — ED Triage Notes (Signed)
Pt c/o weakness and bright red rectal bleeding that started this morning when he woke up. Pt was discharged yesterday from [facility] with rectal bleeding with colonoscopy set up as outpt.

## 2017-04-27 NOTE — H&P (Signed)
History and Physical    Kyle Hicks GNF:[id number] DOB: January 31, 1941 DOA: 04/27/2017  Referring MD/NP/PA: Nat Christen, EDP PCP: Sharilyn Sites, MD  Patient coming from: Home  Chief Complaint: Rectal bleeding  HPI: Kyle Hicks is a 76 y.o. male with history of hypertension, diabetes, stage II chronic kidney disease who was just discharged yesterday for the same problem. At that time he refused to undergo diagnostic procedures. He returns today because this morning he had a couple more black tarry bowel movements now mixed with some red blood. His hemoglobin is 8.9 and was 10.7 upon discharge yesterday. He was transfused 1 unit of PRBCs in the ED, GI was consulted for EGD and admission has been requested.  Past Medical/Surgical History: Past Medical History:  Diagnosis Date  . Diabetes mellitus without complication ([place])   . HTN (hypertension)     History reviewed. No pertinent surgical history.  Social History:  reports that he has quit smoking. He has never used smokeless tobacco. He reports that he does not drink alcohol or use drugs.  Allergies: No Known Allergies  Family History:  Family History  Problem Relation Age of Onset  . Hypertension Mother   . Hyperlipidemia Mother   . Hypertension Father   . Hyperlipidemia Father     Prior to Admission medications   Medication Sig Start Date End Date Taking? Authorizing Provider  aspirin EC 81 MG tablet Take 81 mg by mouth daily.   Yes [provider]  atorvastatin (LIPITOR) 20 MG tablet Take 20 mg by mouth daily.   Yes [provider]  hydrochlorothiazide (HYDRODIURIL) 25 MG tablet Take 25 mg by mouth daily.   Yes [provider]  NIFEdipine (PROCARDIA-XL/ADALAT-CC/NIFEDICAL-XL) 30 MG 24 hr tablet take 1 tablet by mouth once daily 02/14/17  Yes Nida, Marella Chimes, MD  pantoprazole (PROTONIX) 40 MG tablet Take 1 tablet (40 mg total) by mouth 2 (two) times daily. 04/26/17 04/26/18 Yes Tat, Shanon Brow, MD    ramipril (ALTACE) 10 MG capsule take 1 capsule by mouth once daily 02/23/17  Yes Nida, Marella Chimes, MD  sitaGLIPtin-metformin (JANUMET) 50-1000 MG tablet Take 0.5 tablets by mouth 2 (two) times daily. 01/10/17  Yes Cassandria Anger, MD    Review of Systems: Constitutional: Denies fever, chills, diaphoresis, appetite change and fatigue.  HEENT: Denies photophobia, eye pain, redness, hearing loss, ear pain, congestion, sore throat, rhinorrhea, sneezing, mouth sores, trouble swallowing, neck pain, neck stiffness and tinnitus.   Respiratory: Denies SOB, DOE, cough, chest tightness,  and wheezing.   Cardiovascular: Denies chest pain, palpitations and leg swelling.  Gastrointestinal: Denies nausea, vomiting, abdominal pain, diarrhea, constipation and abdominal distention.  Genitourinary: Denies dysuria, urgency, frequency, hematuria, flank pain and difficulty urinating.  Endocrine: Denies: hot or cold intolerance, sweats, changes in hair or nails, polyuria, polydipsia. Musculoskeletal: Denies myalgias, back pain, joint swelling, arthralgias and gait problem.  Skin: Denies pallor, rash and wound.  Neurological: Denies dizziness, seizures, syncope, weakness, light-headedness, numbness and headaches.  Hematological: Denies adenopathy. Easy bruising, personal or family bleeding history  Psychiatric/Behavioral: Denies suicidal ideation, mood changes, confusion, nervousness, sleep disturbance and agitation    Physical Exam: Vitals:   04/27/17 1115 04/27/17 1130 04/27/17 1159 04/27/17 1454  BP: 110/76 109/75 103/67 129/64  Pulse: (!) 117 (!) 114 (!) 115 87  Resp: 19 19 20 20   Temp: 98.8 F (37.1 C)  98.8 F (37.1 C) 98.2 F (36.8 C)  TempSrc: Oral  Oral Oral  SpO2: 98% 98% 100% 100%  Weight:    111.6 kg (246 lb 0.5 oz)  Height:   5\' 10"  (1.778 m) 5\' 10"  (1.778 m)     Constitutional: NAD, calm, comfortable Eyes: PERRL, lids and conjunctivae normal ENMT: Mucous membranes are moist.  Posterior pharynx clear of any exudate or lesions.Normal dentition.  Neck: normal, supple, no masses, no thyromegaly Respiratory: clear to auscultation bilaterally, no wheezing, no crackles. Normal respiratory effort. No accessory muscle use.  Cardiovascular: Regular rate and rhythm, no murmurs / rubs / gallops. No extremity edema. 2+ pedal pulses. No carotid bruits.  Abdomen: no tenderness, no masses palpated. No hepatosplenomegaly. Bowel sounds positive.  Musculoskeletal: no clubbing / cyanosis. No joint deformity upper and lower extremities. Good ROM, no contractures. Normal muscle tone.  Skin: no rashes, lesions, ulcers. No induration Neurologic: CN 2-12 grossly intact. Sensation intact, DTR normal. Strength 5/5 in all 4.  Psychiatric: Normal judgment and insight. Alert and oriented x 3. Normal mood.    Labs on Admission: I have personally reviewed the following labs and imaging studies  CBC:  Recent Labs Lab 04/24/17 1720 04/25/17 0321 04/26/17 0452 04/27/17 0925 04/27/17 1621  WBC 11.5* 9.0 7.6 15.5*  --   NEUTROABS  --   --   --  13.7*  --   HGB 12.7* 11.3* 10.7* 8.9* 9.3*  HCT 37.2* 33.9* 32.8* 26.6* 27.8*  MCV 86.1 87.6 89.6 88.4  --   PLT 273 248 223 274  --    Basic Metabolic Panel:  Recent Labs Lab 04/24/17 1720 04/24/17 1733 04/25/17 0321 04/26/17 0452 04/27/17 0925  NA 143  --  141 139 138  K 3.1*  --  3.6 3.7 3.9  CL 108  --  110 109 110  CO2 24  --  22 23 18*  GLUCOSE 180*  --  140* 146* 247*  BUN 27*  --  29* 23* 31*  CREATININE 1.30*  --  1.24 1.28* 1.63*  CALCIUM 8.8*  --  8.0* 7.9* 7.8*  MG  --  1.6*  --  1.8  --    GFR: Estimated Creatinine Clearance: 49.7 mL/min (A) (by C-G formula based on SCr of 1.63 mg/dL (H)). Liver Function Tests:  Recent Labs Lab 04/27/17 0925  AST 22  ALT 35  ALKPHOS 86  BILITOT 0.4  PROT 5.7*  ALBUMIN 2.5*   No results for input(s): LIPASE, AMYLASE in the last 168 hours. No results for input(s): AMMONIA in  the last 168 hours. Coagulation Profile:  Recent Labs Lab 04/24/17 2016  INR 1.22   Cardiac Enzymes:  Recent Labs Lab 04/24/17 1720 04/24/17 2016 04/25/17 0321 04/25/17 0905  TROPONINI 0.09* 0.09* 0.08* 0.09*   BNP (last 3 results) No results for input(s): PROBNP in the last 8760 hours. HbA1C: No results for input(s): HGBA1C in the last 72 hours. CBG:  Recent Labs Lab 04/24/17 1702 04/24/17 2142  GLUCAP 162* 156*   Lipid Profile: No results for input(s): CHOL, HDL, LDLCALC, TRIG, CHOLHDL, LDLDIRECT in the last 72 hours. Thyroid Function Tests:  Recent Labs  04/25/17 0905  TSH 0.663  FREET4 1.76*   Anemia Panel:  Recent Labs  04/25/17 0905 04/26/17 0452  VITAMINB12 443  --   FERRITIN  --  602*  TIBC  --  162*  IRON  --  39*   Urine analysis:    Component Value Date/Time   COLORURINE YELLOW 04/24/2017 1900   APPEARANCEUR CLEAR 04/24/2017 1900   LABSPEC 1.023 04/24/2017 1900   PHURINE 5.0  04/24/2017 1900   GLUCOSEU NEGATIVE 04/24/2017 1900   HGBUR SMALL (A) 04/24/2017 1900   BILIRUBINUR NEGATIVE 04/24/2017 1900   KETONESUR NEGATIVE 04/24/2017 1900   PROTEINUR NEGATIVE 04/24/2017 1900   UROBILINOGEN 0.2 02/23/2013 1151   NITRITE NEGATIVE 04/24/2017 1900   LEUKOCYTESUR NEGATIVE 04/24/2017 1900   Sepsis Labs: @LABRCNTIP (procalcitonin:4,lacticidven:4) )No results found for this or any previous visit (from the past 240 hour(s)).   Radiological Exams on Admission: No results found.  EKG: Independently reviewed. 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 -Hemoglobin had decreased to 8.9 from 10.7 and just under 24 hours. -Admission requested, was transfused 1 unit of PRBCs in the ED. GI has already seen and is planning on performing EGD today, with possible colonoscopy tomorrow morning pending EGD results. -We'll keep nothing by mouth, check CBCs every 8 hours after transfusion. No further plans for transfusion at present. -Protonix twice a day.  Hypertension -With hemodynamic instability on admission an nothing by mouth state will hold all BP meds for now.  Uncontrolled diabetes -Check A1c, hold metformin, place on sliding scale.  Obesity -BMI is 35.3, counseled on diet and exercise.   DVT prophylaxis: SCDs  Code Status: Full code  Family Communication: Patient only  Disposition Plan: Pending GI evaluation and endoscopic results  Consults called: GI  Admission status: Inpatient    Time Spent: 75 minutes   Lelon Frohlich MD [facility] Pager [phone]  If 7PM-7AM, please contact night-coverage [email or web address] Password [email or web address]  04/27/2017, 5:04 PM

## 2017-04-27 NOTE — Op Note (Signed)
[facility] Patient Name: Kyle Hicks Procedure Date: 04/27/2017 5:23 PM MRN: [id number] Date of Birth: 1940-10-22 Attending MD: Hildred Laser , MD CSN: [id number] Age: 76 Admit Type: Outpatient Procedure:                Upper GI endoscopy Indications:              Suspected upper gastrointestinal bleeding Providers:                Hildred Laser, MD, Lurline Del, RN, Rosina Lowenstein, RN Referring MD:             Eduardo Osier, MD Medicines:                Lidocaine spray, Meperidine 25 mg IV, Midazolam 5                            mg IV Complications:            No immediate complications. Estimated Blood Loss:     Estimated blood loss: none. Estimated blood loss:                            none. Procedure:                Pre-Anesthesia Assessment:                           - Prior to the procedure, a History and Physical                            was performed, and patient medications and                            allergies were reviewed. The patient's tolerance of                            previous anesthesia was also reviewed. The risks                            and benefits of the procedure and the sedation                            options and risks were discussed with the patient.                            All questions were answered, and informed consent                            was obtained. Prior Anticoagulants: The patient                            last took aspirin 3 days prior to the procedure.                            ASA Grade Assessment: III - A patient with severe  systemic disease. After reviewing the risks and                            benefits, the patient was deemed in satisfactory                            condition to undergo the procedure.                           After obtaining informed consent, the endoscope was                            passed under direct vision. Throughout the                             procedure, the patient's blood pressure, pulse, and                            oxygen saturations were monitored continuously. The                            [id number] scope was introduced through the                            mouth, and advanced to the second part of duodenum.                            The upper GI endoscopy was accomplished without                            difficulty. The patient tolerated the procedure                            well. Scope In: 5:36:48 PM Scope Out: 5:43:26 PM Total Procedure Duration: 0 hours 6 minutes 38 seconds  Findings:      The examined esophagus was normal.      The Z-line was irregular and was found 40 cm from the incisors.      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      Extrinsic compression on the stomach was found in the gastric antrum.      The exam of the stomach was otherwise normal.      One non-bleeding cratered duodenal ulcer with no stigmata of bleeding       was found in the first portion of the duodenum. The lesion was twelve mm       by fifteen mm in largest dimension.      The second portion of the duodenum was normal. Impression:               - Normal esophagus.                           - Z-line irregular, 40 cm from the incisors.                           -  Portal hypertensive gastropathy.                           - One non-bleeding duodenal ulcer with no stigmata                            of bleeding.                           - Normal second portion of the duodenum.                           - No specimens collected. Moderate Sedation:      Moderate (conscious) sedation was administered by the endoscopy nurse       and supervised by the endoscopist. The following parameters were       monitored: oxygen saturation, heart rate, blood pressure, CO2       capnography and response to care. Total physician intraservice time was       12  minutes. Recommendation:           - Return patient to hospital ward for ongoing care.                           - Clear liquid diet today.                           - change pantoprazole toinfusion.                           - Sucralfate 1 g by mouth before meals and daily at                            bedtime.                           - H pylori serology.                           - Abdominal CT with contrast on 04/29/2017.                           - Continue present medications. Procedure Code(s):        --- Professional ---                           [phone], Esophagogastroduodenoscopy, flexible,                            transoral; diagnostic, including collection of                            specimen(s) by brushing or washing, when performed                            (separate procedure)  36144, Moderate sedation services provided by the                            same physician or other qualified health care                            professional performing the diagnostic or                            therapeutic service that the sedation supports,                            requiring the presence of an independent trained                            observer to assist in the monitoring of the                            patient's level of consciousness and physiological                            status; initial 15 minutes of intraservice time,                            patient age 53 years or older Diagnosis Code(s):        --- Professional ---                           K22.8, Other specified diseases of esophagus                           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 All rights reserved. The  codes documented in this report are preliminary and upon coder review may  be revised to meet current compliance requirements. Hildred Laser, MD Hildred Laser, MD 04/27/2017 6:06:26 PM This report has been signed electronically. Number of Addenda: 0

## 2017-04-28 DIAGNOSIS — K269 Duodenal ulcer, unspecified as acute or chronic, without hemorrhage or perforation: Secondary | ICD-10-CM

## 2017-04-28 DIAGNOSIS — K3189 Other diseases of stomach and duodenum: Secondary | ICD-10-CM

## 2017-04-28 DIAGNOSIS — K922 Gastrointestinal hemorrhage, unspecified: Secondary | ICD-10-CM

## 2017-04-28 LAB — BASIC METABOLIC PANEL
ANION GAP: 7 (ref 5–15)
BUN: 32 mg/dL — AB (ref 6–20)
CALCIUM: 8 mg/dL — AB (ref 8.9–10.3)
CO2: 22 mmol/L (ref 22–32)
Chloride: 114 mmol/L — ABNORMAL HIGH (ref 101–111)
Creatinine, Ser: 1.4 mg/dL — ABNORMAL HIGH (ref 0.61–1.24)
GFR calc Af Amer: 56 mL/min — ABNORMAL LOW (ref >60)
GFR calc non Af Amer: 48 mL/min — ABNORMAL LOW (ref >60)
GLUCOSE: 153 mg/dL — AB (ref 65–99)
Potassium: 4.2 mmol/L (ref 3.5–5.1)
Sodium: 143 mmol/L (ref 135–145)

## 2017-04-28 LAB — CBC
HEMATOCRIT: 26.7 % — AB (ref 39.0–52.0)
HEMATOCRIT: 24.3 % — AB (ref 39.0–52.0)
Hemoglobin: 8.9 g/dL — ABNORMAL LOW (ref 13.0–17.0)
Hemoglobin: 8.1 g/dL — ABNORMAL LOW (ref 13.0–17.0)
MCH: 29.4 pg (ref 26.0–34.0)
MCH: 29.7 pg (ref 26.0–34.0)
MCHC: 33.3 g/dL (ref 30.0–36.0)
MCHC: 33.3 g/dL (ref 30.0–36.0)
MCV: 88.1 fL (ref 78.0–100.0)
MCV: 89 fL (ref 78.0–100.0)
PLATELETS: 200 10*3/uL (ref 150–400)
Platelets: 191 10*3/uL (ref 150–400)
RBC: 3.03 MIL/uL — ABNORMAL LOW (ref 4.22–5.81)
RBC: 2.73 MIL/uL — ABNORMAL LOW (ref 4.22–5.81)
RDW: 14.3 % (ref 11.5–15.5)
RDW: 14.6 % (ref 11.5–15.5)
WBC: 7.7 10*3/uL (ref 4.0–10.5)
WBC: 6.9 10*3/uL (ref 4.0–10.5)

## 2017-04-28 LAB — GLUCOSE, CAPILLARY
GLUCOSE-CAPILLARY: 112 mg/dL — AB (ref 65–99)
Glucose-Capillary: 140 mg/dL — ABNORMAL HIGH (ref 65–99)
Glucose-Capillary: 139 mg/dL — ABNORMAL HIGH (ref 65–99)
Glucose-Capillary: 128 mg/dL — ABNORMAL HIGH (ref 65–99)

## 2017-04-28 LAB — HEMOGLOBIN A1C
Hgb A1c MFr Bld: 7 % — ABNORMAL HIGH (ref 4.8–5.6)
Mean Plasma Glucose: 154.2 mg/dL

## 2017-04-28 NOTE — Progress Notes (Signed)
PROGRESS NOTE    Kyle Hicks  [id number]:[id number] DOB: 17-Jan-1941 DOA: 04/27/2017 PCP: Sharilyn Sites, MD     Brief Narrative:  Patient is a 76 year old man admitted from home on 10/3 due to rectal bleeding.  It is  important to note that he had just been discharged the day prior on 10/2 for the same and had refused endoscopic studies at that time. Past medical history is significant for hypertension, diabetes, stage II chronic kidney disease. He has been transfused a total of 2 units of PRBCs. Had an endoscopy on 10/3 that showed a duodenal ulcer that did not have stigmata of active bleeding. Also showed portal hypertensive gastropathy.   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 -Continue Protonix infusion, sucralfate, H pylori serology has been requested. -GI has also requested an abdominal CT scan with contrast but have requested that this be done on 10/5 due to risk of rebleeding.  Acute blood loss anemia -Due to rectal bleeding due to duodenal ulcer. -Initially had signs of hemodynamic instability but these resolved quickly with IV fluids and blood products. -Has received just 1 unit of PRBCs. -Hemoglobin remained stable at around 8-8.9.  Hypertension -Was hypotensive on admission, BP meds are currently being held. -Today blood pressure remains between 448 and 185 systolic.  Uncontrolled diabetes -A1c is not terrible at 7.0. -CBGs are fairly well controlled.  Obesity -BMI is 35.3, counseled on diet and exercise.  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   DVT prophylaxis: SCDs Code Status: Full code Family Communication: Patient only Disposition Plan: Plan for discharge home 24-48 hours as long as no signs of rebleeding.  Consultants:   GI  Procedures:   Upper endoscopy on 10/3 with results as above  Antimicrobials:  Anti-infectives    None       Subjective: Feels well, no abdominal pain, did have a black stool today.  Objective: Vitals:   04/27/17 2038 04/27/17 2056 04/28/17 0200 04/28/17 0559  BP:  (!) 143/67 124/67 140/84  Pulse:  82 82 83  Resp:  20 18 18   Temp:  (!) 97.5 F (36.4 C) 98.7 F (37.1 C) 98.7 F (37.1 C)  TempSrc:  Oral Oral Oral  SpO2: 99% 98% 99% 100%  Weight:      Height:        Intake/Output Summary (Last 24 hours) at 04/28/17 1524 Last data filed at 04/28/17 1400  Gross per 24 hour  Intake          2612.08 ml  Output              300 ml  Net          2312.08 ml   Filed Weights   04/27/17 0901 04/27/17 1454  Weight: 109.8 kg (242 lb) 111.6 kg (246 lb 0.5 oz)    Examination:  General exam: Alert, awake, oriented x 3 Respiratory system: Clear to auscultation. Respiratory effort normal. Cardiovascular system:RRR. No murmurs, rubs, gallops. Gastrointestinal system: Abdomen is nondistended, soft and nontender. No organomegaly or masses felt. Normal  bowel sounds heard. Central nervous system: Alert and oriented. No focal neurological deficits. Extremities: No C/C/E, +pedal pulses Skin: No rashes, lesions or ulcers Psychiatry: Judgement and insight appear normal. Mood & affect appropriate.     Data Reviewed: I have personally reviewed following labs and imaging studies  CBC:  Recent Labs Lab 04/26/17 0452 04/27/17 0925 04/27/17 1621 04/27/17 1959 04/28/17 0121 04/28/17 0934  WBC 7.6 15.5*  --  9.1 7.7 6.9  NEUTROABS  --  13.7*  --   --   --   --   HGB 10.7* 8.9* 9.3* 9.0* 8.9* 8.1*  HCT 32.8* 26.6* 27.8* 27.4* 26.7* 24.3*  MCV 89.6 88.4  --  88.1 88.1 89.0    PLT 223 274  --  208 200 163   Basic Metabolic Panel:  Recent Labs Lab 04/24/17 1720 04/24/17 1733 04/25/17 0321 04/26/17 0452 04/27/17 0925 04/28/17 0121  NA 143  --  141 139 138 143  K 3.1*  --  3.6 3.7 3.9 4.2  CL 108  --  110 109 110 114*  CO2 24  --  22 23 18* 22  GLUCOSE 180*  --  140* 146* 247* 153*  BUN 27*  --  29* 23* 31* 32*  CREATININE 1.30*  --  1.24 1.28* 1.63* 1.40*  CALCIUM 8.8*  --  8.0* 7.9* 7.8* 8.0*  MG  --  1.6*  --  1.8  --   --    GFR: Estimated Creatinine Clearance: 57.9 mL/min (A) (by C-G formula based on SCr of 1.4 mg/dL (H)). Liver Function Tests:  Recent Labs Lab 04/27/17 0925  AST 22  ALT 35  ALKPHOS 86  BILITOT 0.4  PROT 5.7*  ALBUMIN 2.5*   No results for input(s): LIPASE, AMYLASE in the last 168 hours. No results for input(s): AMMONIA in the last 168 hours. Coagulation Profile:  Recent Labs Lab 04/24/17 2016  INR 1.22   Cardiac Enzymes:  Recent Labs Lab 04/24/17 1720 04/24/17 2016 04/25/17 0321 04/25/17 0905  TROPONINI 0.09* 0.09* 0.08* 0.09*   BNP (last 3 results) No results for input(s): PROBNP in the last 8760 hours. HbA1C:  Recent Labs  04/27/17 0925  HGBA1C 7.0*   CBG:  Recent Labs Lab 04/24/17 2142 04/27/17 1721 04/27/17 2044 04/28/17 0731 04/28/17 1128  GLUCAP 156* 132* 154* 140* 139*   Lipid Profile: No results for input(s): CHOL, HDL, LDLCALC, TRIG, CHOLHDL, LDLDIRECT in the last 72 hours. Thyroid Function Tests: No results for input(s): TSH, T4TOTAL, FREET4, T3FREE, THYROIDAB in the last 72 hours. Anemia Panel:  Recent Labs  04/26/17 0452  FERRITIN 602*  TIBC 162*  IRON 39*   Urine analysis:    Component Value Date/Time   COLORURINE YELLOW 04/24/2017 1900   APPEARANCEUR CLEAR 04/24/2017 1900   LABSPEC 1.023 04/24/2017 1900   PHURINE 5.0 04/24/2017 1900   GLUCOSEU NEGATIVE 04/24/2017 1900   HGBUR SMALL (A) 04/24/2017 1900   BILIRUBINUR NEGATIVE 04/24/2017 1900   KETONESUR  NEGATIVE 04/24/2017 1900   PROTEINUR NEGATIVE 04/24/2017 1900   UROBILINOGEN 0.2 02/23/2013 1151   NITRITE NEGATIVE 04/24/2017 1900   LEUKOCYTESUR NEGATIVE 04/24/2017 1900   Sepsis Labs: @LABRCNTIP (procalcitonin:4,lacticidven:4)  )No results found for this or any previous visit (from the past 240 hour(s)).       Radiology Studies: No results found.      Scheduled Meds: . insulin aspart  0-5 Units Subcutaneous QHS  . insulin aspart  0-9 Units Subcutaneous TID WC  . sucralfate  1  g Oral TID WC & HS   Continuous Infusions: . sodium chloride 75 mL/hr at 04/28/17 0053  . pantoprozole (PROTONIX) infusion 8 mg/hr (04/27/17 1922)     LOS: 1 day    Time spent: 35 minutes. Greater than 50% of this time was spent in direct contact with the patient coordinating care.     Lelon Frohlich, MD [facility] Pager [phone]  If 7PM-7AM, please contact night-coverage [email or web address] Password [email or web address] 04/28/2017, 3:24 PM

## 2017-04-28 NOTE — Progress Notes (Signed)
Subjective:  BM today, less dark. No n/v, abd pain.    Objective: Vital signs in last 24 hours: Temp:  [97.5 F (36.4 C)-98.8 F (37.1 C)] 98.7 F (37.1 C) (10/04 0559) Pulse Rate:  [82-125] 83 (10/04 0559) Resp:  [16-24] 18 (10/04 0559) BP: (98-143)/(63-90) 140/84 (10/04 0559) SpO2:  [96 %-100 %] 100 % (10/04 0559) Weight:  [242 lb (109.8 kg)-246 lb 0.5 oz (111.6 kg)] 246 lb 0.5 oz (111.6 kg) (10/03 1454) Last BM Date: 04/27/17 General:   Alert,  Well-developed, well-nourished, pleasant and cooperative in NAD Head:  Normocephalic and atraumatic. Eyes:  Sclera clear, no icterus.  Abdomen:  Soft, nontender and nondistended.   Normal bowel sounds, without guarding, and without rebound.   Extremities:  Without clubbing, deformity. 1+ edema bilaterally. Neurologic:  Alert and  oriented x4;  grossly normal neurologically. Skin:  Intact without significant lesions or rashes. Psych:  Alert and cooperative. Normal mood and affect.  Intake/Output from previous day: 10/03 0701 - 10/04 0700 In: 2389.1 [P.O.:480; I.V.:1232.1; Blood:677] Out: 300 [Urine:300] Intake/Output this shift: No intake/output data recorded.  Lab Results: CBC  Recent Labs  04/27/17 0925 04/27/17 1621 04/27/17 1959 04/28/17 0121  WBC 15.5*  --  9.1 7.7  HGB 8.9* 9.3* 9.0* 8.9*  HCT 26.6* 27.8* 27.4* 26.7*  MCV 88.4  --  88.1 88.1  PLT 274  --  208 200   BMET  Recent Labs  04/26/17 0452 04/27/17 0925 04/28/17 0121  NA 139 138 143  K 3.7 3.9 4.2  CL 109 110 114*  CO2 23 18* 22  GLUCOSE 146* 247* 153*  BUN 23* 31* 32*  CREATININE 1.28* 1.63* 1.40*  CALCIUM 7.9* 7.8* 8.0*   LFTs  Recent Labs  04/27/17 0925  BILITOT 0.4  ALKPHOS 86  AST 22  ALT 35  PROT 5.7*  ALBUMIN 2.5*   No results for input(s): LIPASE in the last 72 hours. PT/INR No results for input(s): LABPROT, INR in the last 72 hours.    Imaging Studies: No results found.[2 weeks]   Assessment: 76 y/o male with recurrent  hospitalization for GI bleeding. Admitted earlier in the week but declined EGD. Presented back with melena and red blood per rectum. Drop in Hgb in 3 days from 12.7 to 8.9. Has received one unit of prbcs. EGD yesterday showed mild portal hypertensive gastropathy, extrinsic compression of stomach in the gastric antrum, one non-bleeding cratered duodenal ulcer without stigmata of bleeding (12X43mm).   H/H stable today.  Plan: 1. Continue PPI infusion.  2. Continue Carafate. 3. CT Abd with contrast on 04/29/17. Postpone until 04/29/17 to avoid possibility of wretching/vomiting as patient is at risk of rebleeding.  4. F/u h.pylori serologies.   Laureen Ochs. Bernarda Caffey [facility] [phone] 10/4/20189:08 AM     LOS: 1 day

## 2017-04-29 ENCOUNTER — Inpatient Hospital Stay (HOSPITAL_COMMUNITY): Payer: Medicare Other

## 2017-04-29 ENCOUNTER — Encounter (HOSPITAL_COMMUNITY): Payer: Self-pay

## 2017-04-29 DIAGNOSIS — I10 Essential (primary) hypertension: Secondary | ICD-10-CM

## 2017-04-29 DIAGNOSIS — K625 Hemorrhage of anus and rectum: Secondary | ICD-10-CM

## 2017-04-29 DIAGNOSIS — D649 Anemia, unspecified: Secondary | ICD-10-CM

## 2017-04-29 LAB — H. PYLORI ANTIBODY, IGG: H Pylori IgG: <0.8 Index Value (ref 0.00–0.79)

## 2017-04-29 LAB — GLUCOSE, CAPILLARY
Glucose-Capillary: 148 mg/dL — ABNORMAL HIGH (ref 65–99)
Glucose-Capillary: 109 mg/dL — ABNORMAL HIGH (ref 65–99)
Glucose-Capillary: 120 mg/dL — ABNORMAL HIGH (ref 65–99)

## 2017-04-29 LAB — CBC
HCT: 23 % — ABNORMAL LOW (ref 39.0–52.0)
Hemoglobin: 7.6 g/dL — ABNORMAL LOW (ref 13.0–17.0)
MCH: 29.2 pg (ref 26.0–34.0)
MCHC: 33 g/dL (ref 30.0–36.0)
MCV: 88.5 fL (ref 78.0–100.0)
PLATELETS: 178 10*3/uL (ref 150–400)
RBC: 2.6 MIL/uL — ABNORMAL LOW (ref 4.22–5.81)
RDW: 14.4 % (ref 11.5–15.5)
WBC: 5.2 10*3/uL (ref 4.0–10.5)

## 2017-04-29 LAB — BASIC METABOLIC PANEL
Anion gap: 9 (ref 5–15)
BUN: 15 mg/dL (ref 6–20)
CALCIUM: 7.6 mg/dL — AB (ref 8.9–10.3)
CHLORIDE: 111 mmol/L (ref 101–111)
CO2: 21 mmol/L — AB (ref 22–32)
CREATININE: 1.18 mg/dL (ref 0.61–1.24)
GFR calc Af Amer: >60 mL/min (ref >60)
GFR calc non Af Amer: 59 mL/min — ABNORMAL LOW (ref >60)
Glucose, Bld: 135 mg/dL — ABNORMAL HIGH (ref 65–99)
Potassium: 4.1 mmol/L (ref 3.5–5.1)
SODIUM: 141 mmol/L (ref 135–145)

## 2017-04-29 MED ORDER — IOPAMIDOL (ISOVUE-300) INJECTION 61%
INTRAVENOUS | Status: AC
  Administered 2017-04-29: 30 mL via ORAL
  Filled 2017-04-29: qty 30

## 2017-04-29 MED ORDER — PANTOPRAZOLE SODIUM 40 MG PO TBEC
40.0000 mg | DELAYED_RELEASE_TABLET | Freq: Two times a day (BID) | ORAL | Status: DC
  Administered 2017-04-29 – 2017-04-30 (×2): 40 mg via ORAL
  Filled 2017-04-29 (×2): qty 1

## 2017-04-29 MED ORDER — IOPAMIDOL (ISOVUE-300) INJECTION 61%
100.0000 mL | Freq: Once | INTRAVENOUS | Status: AC | PRN
  Administered 2017-04-29: 100 mL via INTRAVENOUS

## 2017-04-29 NOTE — Progress Notes (Signed)
CT results reviewed. No focal liver abnormality but nodular contour suspicious for liver disease. Gallbladder with asymmetrical wall thickening and multiple gallstones with inflammatory changes suggestive of acute cholecystitis. Radiology recommends U/S follow-up of liver.  Would likely benefit from surgical consult as IP vs. OP for consideration of cholecystectomy. Will follow-up with GI as outpatient and consider serologies and U/S elastography for further liver evaluation for degree of any existing fibrosis and possible etiologies.   Thank you for allowing [place] to participate in the care of Jaclyn Shaggy, DNP, AGNP-C Adult & Gerontological Nurse Practitioner [facility]

## 2017-04-29 NOTE — Care Management Important Message (Signed)
Important Message  Patient Details  Name: KIYAAN HAQ MRN: [id number] Date of Birth: [birth year]   Medicare Important Message Given:  Yes    Gil Ingwersen, Chauncey Reading, RN 04/29/2017, 12:29 PM

## 2017-04-29 NOTE — Progress Notes (Signed)
PROGRESS NOTE    Kyle Hicks  OEH:[id number] DOB: [birth year] DOA: 04/27/2017 PCP: Sharilyn Sites, MD     Brief Narrative:  Patient is a 76 year old man admitted from home on 10/3 due to rectal bleeding.  It is  important to note that he had just been discharged the day prior on 10/2 for the same and had refused endoscopic studies at that time. Past medical history is significant for hypertension, diabetes, stage II chronic kidney disease. He has been transfused a total of 2 units of PRBCs. Had an endoscopy on 10/3 that showed a duodenal ulcer that did not have stigmata of active bleeding. Also showed portal hypertensive gastropathy.   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 -Continue twice a day PPI, sucralfate, H pylori serology has been requested. -GI has also requested an abdominal CT scan with contrast due to extrinsic stomach compression that was noted at time of EGD. CT shows cholelithiasis with a markedly distended gallbladder with inflammatory changes surrounding the gallbladder neck and asymmetric gallbladder wall thickening. Findings concerning for acute cholecystitis. Patient does not appear to have any gallbladder symptoms at this time. -Will request surgical consultation in a.m.  Acute blood loss anemia -Due to rectal bleeding due to duodenal ulcer. -Initially had signs of hemodynamic instability but these resolved quickly with IV fluids and blood products. -Has received just 1 unit of PRBCs. -Hemoglobin has dropped to 7.6 today, although no signs of active bleeding. Continue to monitor and recheck  blood counts in a.m.  Hypertension -Was hypotensive on admission, BP meds are currently being held. -Today blood pressure remains between 037 and 048 systolic.  Uncontrolled diabetes -A1c is not terrible at 7.0. -CBGs are well controlled.  Obesity -BMI is 35.3, counseled on diet and exercise.  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   DVT prophylaxis: SCDs Code Status: Full code Family Communication: Patient only Disposition Plan: Plan for discharge home 24-48 hours as long as no signs of rebleeding and pending surgical recommendations.  Consultants:   GI  Procedures:   Upper endoscopy on 10/3 with results as above  Antimicrobials:  Anti-infectives    None       Subjective: Feels well, no current complaints, specifically no abdominal pain, no nausea, no vomiting. No further black stools stop  Objective: Vitals:   04/28/17 1956 04/28/17 2000 04/29/17 0501 04/29/17 1317  BP:  (!) 143/73 (!) 143/72 134/76  Pulse:  77 79 78  Resp:  18 20 16   Temp:  98.4 F (36.9 C) 98.4 F (36.9 C) 98.7 F (37.1 C)  TempSrc:  Oral Oral Oral  SpO2: 97% 97% 100% 100%  Weight:   117.4 kg (258 lb 14.4 oz)   Height:   5\' 10"  (1.778 m)     Intake/Output Summary (Last 24 hours) at 04/29/17 1741 Last data filed at 04/29/17 1254  Gross per 24 hour  Intake              480 ml  Output  500 ml  Net              -20 ml   Filed Weights   04/27/17 0901 04/27/17 1454 04/29/17 0501  Weight: 109.8 kg (242 lb) 111.6 kg (246 lb 0.5 oz) 117.4 kg (258 lb 14.4 oz)    Examination:  General exam: Alert, awake, oriented x 3 Respiratory system: Clear to auscultation. Respiratory effort normal. Cardiovascular system:RRR. No murmurs, rubs, gallops. Gastrointestinal system: Abdomen is nondistended, soft and nontender. No organomegaly or masses felt. Normal bowel sounds heard. Central nervous system: Alert and oriented. No  focal neurological deficits. Extremities: No C/C/E, +pedal pulses Skin: No rashes, lesions or ulcers Psychiatry: Judgement and insight appear normal. Mood & affect appropriate.      Data Reviewed: I have personally reviewed following labs and imaging studies  CBC:  Recent Labs Lab 04/27/17 0925 04/27/17 1621 04/27/17 1959 04/28/17 0121 04/28/17 0934 04/29/17 0556  WBC 15.5*  --  9.1 7.7 6.9 5.2  NEUTROABS 13.7*  --   --   --   --   --   HGB 8.9* 9.3* 9.0* 8.9* 8.1* 7.6*  HCT 26.6* 27.8* 27.4* 26.7* 24.3* 23.0*  MCV 88.4  --  88.1 88.1 89.0 88.5  PLT 274  --  208 200 191 147   Basic Metabolic Panel:  Recent Labs Lab 04/24/17 1733 04/25/17 0321 04/26/17 0452 04/27/17 0925 04/28/17 0121 04/29/17 0556  NA  --  141 139 138 143 141  K  --  3.6 3.7 3.9 4.2 4.1  CL  --  110 109 110 114* 111  CO2  --  22 23 18* 22 21*  GLUCOSE  --  140* 146* 247* 153* 135*  BUN  --  29* 23* 31* 32* 15  CREATININE  --  1.24 1.28* 1.63* 1.40* 1.18  CALCIUM  --  8.0* 7.9* 7.8* 8.0* 7.6*  MG 1.6*  --  1.8  --   --   --    GFR: Estimated Creatinine Clearance: 70.5 mL/min (by C-G formula based on SCr of 1.18 mg/dL). Liver Function Tests:  Recent Labs Lab 04/27/17 0925  AST 22  ALT 35  ALKPHOS 86  BILITOT 0.4  PROT 5.7*  ALBUMIN 2.5*   No results for input(s): LIPASE, AMYLASE in the last 168 hours. No results for input(s): AMMONIA in the last 168 hours. Coagulation Profile:  Recent Labs Lab 04/24/17 2016  INR 1.22   Cardiac Enzymes:  Recent Labs Lab 04/24/17 1720 04/24/17 2016 04/25/17 0321 04/25/17 0905  TROPONINI 0.09* 0.09* 0.08* 0.09*   BNP (last 3 results) No results for input(s): PROBNP in the last 8760 hours. HbA1C:  Recent Labs  04/27/17 0925  HGBA1C 7.0*   CBG:  Recent Labs Lab 04/28/17 1128 04/28/17 1659 04/28/17 2035 04/29/17 1103 04/29/17 1600  GLUCAP 139* 112* 128* 148* 109*   Lipid Profile: No results for input(s): CHOL, HDL, LDLCALC,  TRIG, CHOLHDL, LDLDIRECT in the last 72 hours. Thyroid Function Tests: No results for input(s): TSH, T4TOTAL, FREET4, T3FREE, THYROIDAB in the last 72 hours. Anemia Panel: No results for input(s): VITAMINB12, FOLATE, FERRITIN, TIBC, IRON, RETICCTPCT in the last 72 hours. Urine analysis:    Component Value Date/Time   COLORURINE YELLOW 04/24/2017 1900   APPEARANCEUR CLEAR 04/24/2017 1900   LABSPEC 1.023 04/24/2017 1900   PHURINE 5.0 04/24/2017 1900   GLUCOSEU NEGATIVE 04/24/2017 1900   HGBUR SMALL (A) 04/24/2017 1900   BILIRUBINUR NEGATIVE 04/24/2017 1900   KETONESUR NEGATIVE 04/24/2017 1900  PROTEINUR NEGATIVE 04/24/2017 1900   UROBILINOGEN 0.2 02/23/2013 1151   NITRITE NEGATIVE 04/24/2017 1900   LEUKOCYTESUR NEGATIVE 04/24/2017 1900   Sepsis Labs: @LABRCNTIP (procalcitonin:4,lacticidven:4)  )No results found for this or any previous visit (from the past 240 hour(s)).       Radiology Studies: Ct Abdomen Pelvis W Contrast  Result Date: 04/29/2017 CLINICAL DATA:  Bloody stools.  Concern for cirrhosis. EXAM: CT ABDOMEN AND PELVIS WITH CONTRAST TECHNIQUE: Multidetector CT imaging of the abdomen and pelvis was performed using the standard protocol following bolus administration of intravenous contrast. CONTRAST:  139mL ISOVUE-300 IOPAMIDOL (ISOVUE-300) INJECTION 61% COMPARISON:  None. FINDINGS: Lower chest: No acute abnormality.  Bibasilar atelectasis. Hepatobiliary: No focal liver abnormality. Questionable nodular liver surface. The gallbladder is massively distended, with multiple gallstones in the gallbladder neck. There is surrounding inflammatory stranding around the gallbladder neck. Portions of the gallbladder wall measured up to 6 mm in thickness. No biliary dilatation. Pancreas: Unremarkable. No pancreatic ductal dilatation or surrounding inflammatory changes. Spleen: Normal in size without focal abnormality. Adrenals/Urinary Tract: There is a 1.5 cm nodule in the right adrenal  gland. The left adrenal gland is unremarkable. There is a 2.9 cm simple cyst in the lower pole of the left kidney. No renal calculi or hydronephrosis. The bladder is decompressed. Stomach/Bowel: Small hiatal hernia. Stomach is otherwise within normal limits. Appendix appears normal. No evidence of bowel wall thickening, distention, or inflammatory changes. Scattered left-sided diverticulosis. Vascular/Lymphatic: Aortic atherosclerosis. No enlarged abdominal or pelvic lymph nodes. Reproductive: Prostate is unremarkable. Other: No free fluid or pneumoperitoneum. Small umbilical hernia containing fat and a single loop of small bowel. Bilateral fat containing inguinal hernias. Musculoskeletal: No acute or significant osseous findings. Moderate to severe degenerative changes of the lumbar spine. There is a 4.3 x 7.8 x 5.5 cm (AP by transverse by CC) lipoma in the left gluteus maximus muscle. IMPRESSION: 1. Cholelithiasis with markedly distended gallbladder, inflammatory changes surrounding the gallbladder neck, and asymmetric gallbladder wall thickening. Findings are concerning for acute cholecystitis. 2. Questionable liver surface nodularity, which could reflect underlying cirrhosis. No significant caudate lobe hypertrophy. Consider ultrasound for further evaluation. 3. Indeterminate 1.5 cm right adrenal nodule. 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 Consider 12 month follow-up adrenal protocol CT. This recommendation follows ACR consensus guidelines: Management of Incidental Adrenal Masses: A White Paper of the ACR Incidental Findings Committee. J Am Coll Radiol 2017;14:1038-1044. 4.  Aortic atherosclerosis (ICD10-I70.0). Electronically Signed   By: Titus Dubin M.D.   On: 04/29/2017 14:09        Scheduled Meds: . insulin aspart  0-5 Units Subcutaneous QHS  . insulin aspart  0-9 Units Subcutaneous TID WC  . pantoprazole  40 mg Oral BID AC  . sucralfate  1 g Oral TID WC  & HS   Continuous Infusions: . sodium chloride 75 mL/hr at 04/29/17 0142     LOS: 2 days    Time spent: 35 minutes. Greater than 50% of this time was spent in direct contact with the patient coordinating care.     Lelon Frohlich, MD [facility] Pager [phone]  If 7PM-7AM, please contact night-coverage [email or web address] Password [email or web address] 04/29/2017, 5:41 PM

## 2017-04-29 NOTE — Progress Notes (Signed)
Consult to General Surgery called at 1751 to Dr. Rosana Hoes. Will see patient tomorrow, 10/6.

## 2017-04-29 NOTE — Progress Notes (Signed)
    Subjective: Doing well today. No abdominal pain, N/V. Did have a bowel movement yesterday still dark. Denies any further GI complaints.   Objective: Vital signs in last 24 hours: Temp:  [98.3 F (36.8 C)-98.4 F (36.9 C)] 98.4 F (36.9 C) (10/05 0501) Pulse Rate:  [77-90] 79 (10/05 0501) Resp:  [18-20] 20 (10/05 0501) BP: (130-143)/(72-75) 143/72 (10/05 0501) SpO2:  [97 %-100 %] 100 % (10/05 0501) Weight:  [258 lb 14.4 oz (117.4 kg)] 258 lb 14.4 oz (117.4 kg) (10/05 0501) Last BM Date: 04/27/17 General:   Alert and oriented, pleasant Head:  Normocephalic and atraumatic. Eyes:  No icterus, sclera clear. Conjuctiva pink.  Heart:  S1, S2 present, no murmurs noted.  Lungs: Clear to auscultation bilaterally, without wheezing, rales, or rhonchi.  Abdomen:  Bowel sounds present, rounded but soft, non-tender, non-distended. No HSM or hernias noted. No rebound or guarding. No masses appreciated  Msk:  Symmetrical without gross deformities. Pulses:  Normal bilateral DP pulses noted. Extremities:  Without clubbing or edema. Neurologic:  Alert and  oriented x4;  grossly normal neurologically. Psych:  Alert and cooperative. Normal mood and affect.  Intake/Output from previous day: 10/04 0701 - 10/05 0700 In: 1500 [P.O.:900; I.V.:600] Out: 300 [Urine:300] Intake/Output this shift: No intake/output data recorded.  Lab Results:  Recent Labs  04/28/17 0121 04/28/17 0934 04/29/17 0556  WBC 7.7 6.9 5.2  HGB 8.9* 8.1* 7.6*  HCT 26.7* 24.3* 23.0*  PLT 200 191 178   BMET  Recent Labs  04/27/17 0925 04/28/17 0121 04/29/17 0556  NA 138 143 141  K 3.9 4.2 4.1  CL 110 114* 111  CO2 18* 22 21*  GLUCOSE 247* 153* 135*  BUN 31* 32* 15  CREATININE 1.63* 1.40* 1.18  CALCIUM 7.8* 8.0* 7.6*   LFT  Recent Labs  04/27/17 0925  PROT 5.7*  ALBUMIN 2.5*  AST 22  ALT 35  ALKPHOS 86  BILITOT 0.4   PT/INR No results for input(s): LABPROT, INR in the last 72 hours. Hepatitis  Panel No results for input(s): HEPBSAG, HCVAB, HEPAIGM, HEPBIGM in the last 72 hours.   Studies/Results: No results found.  Assessment: 76 y/o male with recurrent hospitalization for GI bleeding. Admitted earlier in the week but declined EGD. Presented back with melena and red blood per rectum. Drop in Hgb in 3 days from 12.7 to 8.9. Has received one unit of prbcs. EGD showed mild portal hypertensive gastropathy, extrinsic compression of stomach in the gastric antrum, one non-bleeding cratered duodenal ulcer without stigmata of bleeding (12X42mm).   H/H will mild drop to 7.6 but overall stable. No s/s of anemia. Possible equilibration/hydration effect. CT yesterday held to prevent retching/vomiting and risk for rebleed. No N/V today, will proceed for CT to evaluate for cirrhosis and because of noted extrinsic gastric compression on EGD. H. Pylori results not back yet.  Plan: 1. CT abdomen/pelvis with contrast today 2. Switch to po PPI bid 3. Discharge on bid PPI x 3 months 4. F/u on H. Pylori serologies 5. Outpatient f/u in 2 months to wrap up loose ends; no need for surveillance EGD at this time. 6. Monitor for any further GI bleed 7. Monitor H/H   Thank you for allowing [place] to participate in the care of Kyle Shaggy, DNP, AGNP-C Adult & Gerontological Nurse Practitioner [facility]     LOS: 2 days    04/29/2017, 8:18 AM

## 2017-04-30 LAB — GLUCOSE, CAPILLARY
GLUCOSE-CAPILLARY: 115 mg/dL — AB (ref 65–99)
Glucose-Capillary: 119 mg/dL — ABNORMAL HIGH (ref 65–99)

## 2017-04-30 MED ORDER — SUCRALFATE 1 GM/10ML PO SUSP: 1.0000 g | Freq: Three times a day (TID) | ORAL | 0 refills | Status: DC

## 2017-04-30 NOTE — Discharge Summary (Signed)
Physician Discharge Summary  Kyle Hicks KPT:[id number] DOB: 11-26-1940 DOA: 04/27/2017  PCP: Kyle Sites, MD  Admit date: 04/27/2017 Discharge date: 04/30/2017  Time spent: 45 minutes  Recommendations for Outpatient Follow-up:  -Will be discharged home today. -Advised to follow up with PCP in 2 weeks.  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 male with history of hypertension, diabetes, stage II chronic kidney disease who was just discharged yesterday for the same problem. At that time he refused to undergo diagnostic procedures. He returns today because this morning he had a couple more black tarry bowel movements now mixed with some red blood. His hemoglobin is 8.9 and was 10.7 upon discharge yesterday. He was transfused 1 unit of PRBCs in the ED, GI was consulted for EGD and admission has been requested.  Hospital Course:   Rectal bleeding/melena -Likely due to duodenal ulcer despite the fact that it was not actively bleeding at time of endoscopy. -Continue twice a day PPI, sucralfate, H pylori serology has been requested. -GI has also requested an abdominal CT scan with contrast due to extrinsic stomach compression that was noted at time of EGD. CT shows cholelithiasis with a markedly distended gallbladder with inflammatory changes surrounding the gallbladder neck and asymmetric gallbladder wall thickening. Findings concerning for acute cholecystitis. Patient does not appear to have any  gallbladder symptoms at this time. -Seen by surgery, Dr. Rosana Hoes, due to absence of symptoms no plan for gallbladder removal surgery at this time.  Acute blood loss anemia -Due to rectal bleeding due to duodenal ulcer. -Initially had signs of hemodynamic instability but these resolved quickly with IV fluids and blood products. -Has received just 1 unit of PRBCs. -No signs of active bleeding.  Hypertension -Was hypotensive on admission. -Blood pressure has stabilized and has been restarted on his home antihypertensive agents.  Uncontrolled diabetes -A1c is not terrible at 7.0. -CBGs are well controlled.  Obesity -BMI is 35.3, counseled on diet and exercise.  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   Procedures:  EGD as above   Consultations:  GI  Surgery  Discharge Instructions  Discharge Instructions    Diet - low sodium heart healthy    Complete by:  As directed    Increase activity slowly    Complete by:  As directed      Allergies as of 04/30/2017   No Known Allergies     Medication List    STOP taking these medications   aspirin EC 81 MG tablet     TAKE these medications   atorvastatin 20 MG tablet Commonly known as:  LIPITOR Take 20 mg by mouth daily.   hydrochlorothiazide 25 MG tablet Commonly known as:  HYDRODIURIL Take 25 mg by mouth daily.   NIFEdipine 30 MG 24 hr tablet Commonly known as:  PROCARDIA-XL/ADALAT-CC/NIFEDICAL-XL take 1 tablet by mouth once daily  pantoprazole 40 MG tablet Commonly known as:  PROTONIX Take 1 tablet (40 mg total) by mouth 2 (two) times daily.   ramipril 10 MG capsule Commonly known as:  ALTACE take 1 capsule by mouth once daily   sitaGLIPtin-metformin 50-1000 MG tablet Commonly known as:  JANUMET Take 0.5 tablets by mouth 2 (two) times daily.   sucralfate 1 GM/10ML suspension Commonly known as:  CARAFATE Take 10 mLs (1 g total) by mouth  4 (four) times daily -  with meals and at bedtime.      No Known Allergies Follow-up Information    Kyle Sites, MD. Schedule an appointment as soon as possible for a visit in 2 week(s).   Specialty:  Family Medicine Contact information: [address] [place] [address] [phone]            The results of significant diagnostics from this hospitalization (including imaging, microbiology, ancillary and laboratory) are listed below for reference.    Significant Diagnostic Studies: Ct Abdomen Pelvis W Contrast  Result Date: 04/29/2017 CLINICAL DATA:  Bloody stools.  Concern for cirrhosis. EXAM: CT ABDOMEN AND PELVIS WITH CONTRAST TECHNIQUE: Multidetector CT imaging of the abdomen and pelvis was performed using the standard protocol following bolus administration of intravenous contrast. CONTRAST:  158mL ISOVUE-300 IOPAMIDOL (ISOVUE-300) INJECTION 61% COMPARISON:  None. FINDINGS: Lower chest: No acute abnormality.  Bibasilar atelectasis. Hepatobiliary: No focal liver abnormality. Questionable nodular liver surface. The gallbladder is massively distended, with multiple gallstones in the gallbladder neck. There is surrounding inflammatory stranding around the gallbladder neck. Portions of the gallbladder wall measured up to 6 mm in thickness. No biliary dilatation. Pancreas: Unremarkable. No pancreatic ductal dilatation or surrounding inflammatory changes. Spleen: Normal in size without focal abnormality. Adrenals/Urinary Tract: There is a 1.5 cm nodule in the right adrenal gland. The left adrenal gland is unremarkable. There is a 2.9 cm simple cyst in the lower pole of the left kidney. No renal calculi or hydronephrosis. The bladder is decompressed. Stomach/Bowel: Small hiatal hernia. Stomach is otherwise within normal limits. Appendix appears normal. No evidence of bowel wall thickening, distention, or inflammatory changes. Scattered left-sided diverticulosis. Vascular/Lymphatic:  Aortic atherosclerosis. No enlarged abdominal or pelvic lymph nodes. Reproductive: Prostate is unremarkable. Other: No free fluid or pneumoperitoneum. Small umbilical hernia containing fat and a single loop of small bowel. Bilateral fat containing inguinal hernias. Musculoskeletal: No acute or significant osseous findings. Moderate to severe degenerative changes of the lumbar spine. There is a 4.3 x 7.8 x 5.5 cm (AP by transverse by CC) lipoma in the left gluteus maximus muscle. IMPRESSION: 1. Cholelithiasis with markedly distended gallbladder, inflammatory changes surrounding the gallbladder neck, and asymmetric gallbladder wall thickening. Findings are concerning for acute cholecystitis. 2. Questionable liver surface nodularity, which could reflect underlying cirrhosis. No significant caudate lobe hypertrophy. Consider ultrasound for further evaluation. 3. Indeterminate 1.5 cm right adrenal nodule. 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 Consider 12 month follow-up adrenal protocol CT. This recommendation follows ACR consensus guidelines: Management of Incidental Adrenal Masses: A White Paper of the ACR Incidental Findings Committee. J Am Coll Radiol 2017;14:1038-1044. 4.  Aortic atherosclerosis (ICD10-I70.0). Electronically Signed   By: Titus Dubin M.D.   On: 04/29/2017 14:09    Microbiology: No results found for this or any previous visit (from the past 240 hour(s)).   Labs: Basic Metabolic Panel:  Recent Labs Lab 04/24/17 1733 04/25/17 0321 04/26/17 0600 04/27/17 4599 04/28/17 0121 04/29/17 0556  NA  --  141 139 138 143 141  K  --  3.6 3.7 3.9 4.2 4.1  CL  --  110 109 110 114* 111  CO2  --  22 23 18* 22 21*  GLUCOSE  --  140* 146* 247* 153* 135*  BUN  --  29* 23* 31* 32* 15  CREATININE  --  1.24 1.28* 1.63* 1.40* 1.18  CALCIUM  --  8.0* 7.9* 7.8* 8.0* 7.6*  MG 1.6*  --  1.8  --   --   --    Liver Function Tests:  Recent Labs Lab 04/27/17 0925   AST 22  ALT 35  ALKPHOS 86  BILITOT 0.4  PROT 5.7*  ALBUMIN 2.5*   No results for input(s): LIPASE, AMYLASE in the last 168 hours. No results for input(s): AMMONIA in the last 168 hours. CBC:  Recent Labs Lab 04/27/17 0925 04/27/17 1621 04/27/17 1959 04/28/17 0121 04/28/17 0934 04/29/17 0556  WBC 15.5*  --  9.1 7.7 6.9 5.2  NEUTROABS 13.7*  --   --   --   --   --   HGB 8.9* 9.3* 9.0* 8.9* 8.1* 7.6*  HCT 26.6* 27.8* 27.4* 26.7* 24.3* 23.0*  MCV 88.4  --  88.1 88.1 89.0 88.5  PLT 274  --  208 200 191 178   Cardiac Enzymes:  Recent Labs Lab 04/24/17 1720 04/24/17 2016 04/25/17 0321 04/25/17 0905  TROPONINI 0.09* 0.09* 0.08* 0.09*   BNP: BNP (last 3 results) No results for input(s): BNP in the last 8760 hours.  ProBNP (last 3 results) No results for input(s): PROBNP in the last 8760 hours.  CBG:  Recent Labs Lab 04/29/17 1103 04/29/17 1600 04/29/17 2131 04/30/17 0727 04/30/17 1136  GLUCAP 148* 109* 120* 115* 119*       Signed:  HERNANDEZ ACOSTA,ESTELA  [facility] Pager: [phone] 04/30/2017, 5:46 PM

## 2017-04-30 NOTE — Progress Notes (Signed)
Pt discharged home today per Dr. Jerilee Hoh. Pt's IV site D/C'd and WDL. Pt's VSS. Pt provided with home medication list, discharge instructions and prescriptions. Verbalized understanding. Pt left floor via WC in stable condition accompanied by NT and wife.

## 2017-04-30 NOTE — Consult Note (Signed)
SURGICAL CONSULTATION NOTE (initial) - cpt: [id number]  HISTORY OF PRESENT ILLNESS (HPI):  76 y.o. male presented to [facility] ED 3 days ago for evaluation of blood per rectum with acute blood loss anemia, attributed to duodenal ulcer. During EGD, duodenal ulcer was not bleeding, but external gastric compression was appreciated, and CT abdominal imaging was ordered to assess for cirrhosis, but instead identified a very large gallbladder with cholelithiasis. Patient denies any abdominal pain, RUQ/epigastric, post-prandial, or otherwise and denies any N/V or early satiety.   Surgery is consulted by medical physician Dr. Jerilee Hoh in this context for evaluation and management of very large gallbladder with cholelithiasis on CT abdominal imaging.  PAST MEDICAL HISTORY (PMH):  Past Medical History:  Diagnosis Date  . Diabetes mellitus without complication ([place])   . HTN (hypertension)      PAST SURGICAL HISTORY ([place]):  Past Surgical History:  Procedure Laterality Date  . ESOPHAGOGASTRODUODENOSCOPY N/A 04/27/2017   Procedure: ESOPHAGOGASTRODUODENOSCOPY (EGD);  Surgeon: Rogene Houston, MD;  Location: [facility];  Service: Endoscopy;  Laterality: N/A;  . NO PAST SURGERIES       MEDICATIONS:  Prior to Admission medications   Medication Sig Start Date End Date Taking? Authorizing Provider  aspirin EC 81 MG tablet Take 81 mg by mouth daily.   Yes [provider]  atorvastatin (LIPITOR) 20 MG tablet Take 20 mg by mouth daily.   Yes [provider]  hydrochlorothiazide (HYDRODIURIL) 25 MG tablet Take 25 mg by mouth daily.   Yes [provider]  NIFEdipine (PROCARDIA-XL/ADALAT-CC/NIFEDICAL-XL) 30 MG 24 hr tablet take 1 tablet by mouth once daily 02/14/17  Yes Nida, Marella Chimes, MD  pantoprazole (PROTONIX) 40 MG tablet Take 1 tablet (40 mg total) by mouth 2 (two) times daily. 04/26/17 04/26/18 Yes Tat, Shanon Brow, MD  ramipril (ALTACE) 10 MG capsule take 1 capsule by mouth once daily  02/23/17  Yes Nida, Marella Chimes, MD  sitaGLIPtin-metformin (JANUMET) 50-1000 MG tablet Take 0.5 tablets by mouth 2 (two) times daily. 01/10/17  Yes Nida, Marella Chimes, MD     ALLERGIES:  No Known Allergies   SOCIAL HISTORY:  Social History   Social History  . Marital status: Married    Spouse name: N/A  . Number of children: N/A  . Years of education: N/A   Occupational History  . Not on file.   Social History Main Topics  . Smoking status: Former Smoker    Packs/day: 0.50    Types: Cigarettes    Quit date: 07/26/1987  . Smokeless tobacco: Never Used  . Alcohol use No  . Drug use: No  . Sexual activity: Not on file   Other Topics Concern  . Not on file   Social History Narrative  . No narrative on file    The patient currently resides (home / rehab facility / nursing home): Home The patient normally is (ambulatory / bedbound): Ambulatory   FAMILY HISTORY:  Family History  Problem Relation Age of Onset  . Hypertension Mother   . Hyperlipidemia Mother   . Hypertension Father   . Hyperlipidemia Father      REVIEW OF SYSTEMS:  Constitutional: denies weight loss, fever, chills, or sweats  Eyes: denies any other vision changes, history of eye injury  ENT: denies sore throat, hearing problems  Respiratory: denies shortness of breath, wheezing  Cardiovascular: denies chest pain, palpitations  Gastrointestinal: abdominal pain, N/V, and bowel function as per HPI Genitourinary: denies burning with urination or urinary frequency Musculoskeletal:  denies any other joint pains or cramps  Skin: denies any other rashes or skin discolorations  Neurological: denies any other headache, dizziness, weakness  Psychiatric: denies any other depression, anxiety   All other review of systems were negative   VITAL SIGNS:  Temp:  [98.4 F (36.9 C)-98.7 F (37.1 C)] 98.6 F (37 C) (10/06 0551) Pulse Rate:  [66-78] 69 (10/06 0551) Resp:  [16] 16 (10/06 0551) BP:  (129-134)/(65-76) 132/73 (10/06 0551) SpO2:  [100 %] 100 % (10/06 0551)     Height: 5\' 10"  (177.8 cm) Weight: 258 lb 14.4 oz (117.4 kg) BMI (Calculated): 37.15   INTAKE/OUTPUT:  This shift: Total I/O In: -  Out: 125 [Urine:125]  Last 2 shifts: @IOLAST2SHIFTS @   PHYSICAL EXAM:  Constitutional:  -- Obese body habitus  -- Awake, alert, and oriented x3  Eyes:  -- Pupils equally round and reactive to light  -- No scleral icterus  Ear, nose, and throat:  -- No jugular venous distension  Pulmonary:  -- No crackles  -- Equal breath sounds bilaterally -- Breathing non-labored at rest Cardiovascular:  -- S1, S2 present  -- No pericardial rubs Gastrointestinal:  -- Abdomen soft, nontender, non-distended, no guarding or rebound tenderness -- No abdominal masses appreciated, pulsatile or otherwise  Musculoskeletal and Integumentary:  -- Wounds or skin discoloration: None appreciated -- Extremities: B/L UE and LE FROM, hands and feet warm  Neurologic:  -- Motor function: intact and symmetric -- Sensation: intact and symmetric  Labs:  CBC Latest Ref Rng & Units 04/29/2017 04/28/2017 04/28/2017  WBC 4.0 - 10.5 K/uL 5.2 6.9 7.7  Hemoglobin 13.0 - 17.0 g/dL 7.6(L) 8.1(L) 8.9(L)  Hematocrit 39.0 - 52.0 % 23.0(L) 24.3(L) 26.7(L)  Platelets 150 - 400 K/uL 178 191 200   CMP Latest Ref Rng & Units 04/29/2017 04/28/2017 04/27/2017  Glucose 65 - 99 mg/dL 135(H) 153(H) 247(H)  BUN 6 - 20 mg/dL 15 32(H) 31(H)  Creatinine 0.61 - 1.24 mg/dL 1.18 1.40(H) 1.63(H)  Sodium 135 - 145 mmol/L 141 143 138  Potassium 3.5 - 5.1 mmol/L 4.1 4.2 3.9  Chloride 101 - 111 mmol/L 111 114(H) 110  CO2 22 - 32 mmol/L 21(L) 22 18(L)  Calcium 8.9 - 10.3 mg/dL 7.6(L) 8.0(L) 7.8(L)  Total Protein 6.5 - 8.1 g/dL - - 5.7(L)  Total Bilirubin 0.3 - 1.2 mg/dL - - 0.4  Alkaline Phos 38 - 126 U/L - - 86  AST 15 - 41 U/L - - 22  ALT 17 - 63 U/L - - 35    Imaging studies:  CT Abdomen and Pelvis with Contrast (04/29/2017) 1.  Cholelithiasis with markedly distended gallbladder, inflammatory changes surrounding the gallbladder neck, and asymmetric gallbladder wall thickening. Findings are concerning for acute cholecystitis. 2. Questionable liver surface nodularity, which could reflect underlying cirrhosis. No significant caudate lobe hypertrophy. Consider ultrasound for further evaluation. 3. Indeterminate 1.5 cm right adrenal nodule. 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 Consider 12 month follow-up adrenal protocol CT. This recommendation follows ACR consensus guidelines: Management of Incidental Adrenal Masses: A White Paper of the ACR Incidental Findings Committee. J Am Coll Radiol 2017;14:1038-1044. 4.  Aortic atherosclerosis  Assessment/Plan: (ICD-10's: K70.20) 76 y.o. male with radiographically impressive but clinically asymptomatic enlarged gallbladder with cholelithiasis without leukocytosis or abnormal LFT's, complicated by recent GI bleed and by comorbidities including DM, HTN, and obesity (BMI >37).   - no indication for urgent surgical intervention at this time  - reviewed CT  with patient and discussed signs/symtoms of biliary disease  - outpatient surgical follow-up to discuss elective cholecystectomy  - medical management of comorbidities per medical team  All of the above findings and recommendations were discussed with the patient and his medical physician, and all of patient's questions were answered to his expressed satisfaction.  Thank you for the opportunity to participate in this patient's care.   -- Marilynne Drivers Rosana Hoes, MD, [place]: [facility] - Partnering for exceptional care. Office: [phone]

## 2017-05-01 LAB — TYPE AND SCREEN
ABO/RH(D): B POS
ANTIBODY SCREEN: NEGATIVE
UNIT DIVISION: 0
Unit division: 0

## 2017-05-01 LAB — BPAM RBC
Blood Product Expiration Date: 201810152359
Blood Product Expiration Date: 201810152359
ISSUE DATE / TIME: 201810031039
UNIT TYPE AND RH: 5100
Unit Type and Rh: 5100

## 2017-05-02 ENCOUNTER — Encounter: Payer: Self-pay | Admitting: Nurse Practitioner

## 2017-05-10 DIAGNOSIS — Z23 Encounter for immunization: Secondary | ICD-10-CM | POA: Diagnosis not present

## 2017-05-19 ENCOUNTER — Other Ambulatory Visit: Payer: Self-pay | Admitting: "Endocrinology

## 2017-06-08 DIAGNOSIS — E782 Mixed hyperlipidemia: Secondary | ICD-10-CM | POA: Diagnosis not present

## 2017-06-08 DIAGNOSIS — E781 Pure hyperglyceridemia: Secondary | ICD-10-CM | POA: Diagnosis not present

## 2017-06-08 DIAGNOSIS — Z1389 Encounter for screening for other disorder: Secondary | ICD-10-CM | POA: Diagnosis not present

## 2017-06-08 DIAGNOSIS — I10 Essential (primary) hypertension: Secondary | ICD-10-CM | POA: Diagnosis not present

## 2017-06-08 DIAGNOSIS — E1101 Type 2 diabetes mellitus with hyperosmolarity with coma: Secondary | ICD-10-CM | POA: Diagnosis not present

## 2017-06-08 DIAGNOSIS — Z0001 Encounter for general adult medical examination with abnormal findings: Secondary | ICD-10-CM | POA: Diagnosis not present

## 2017-06-08 DIAGNOSIS — Z6838 Body mass index (BMI) 38.0-38.9, adult: Secondary | ICD-10-CM | POA: Diagnosis not present

## 2017-06-10 ENCOUNTER — Ambulatory Visit (INDEPENDENT_AMBULATORY_CARE_PROVIDER_SITE_OTHER): Payer: Medicare Other | Admitting: Nurse Practitioner

## 2017-06-10 ENCOUNTER — Encounter: Payer: Self-pay | Admitting: Nurse Practitioner

## 2017-06-10 VITALS — BP 145/87 | HR 101 | Temp 97.7°F | Ht 66.5 in | Wt 242.2 lb

## 2017-06-10 DIAGNOSIS — K269 Duodenal ulcer, unspecified as acute or chronic, without hemorrhage or perforation: Secondary | ICD-10-CM

## 2017-06-10 DIAGNOSIS — K3189 Other diseases of stomach and duodenum: Secondary | ICD-10-CM

## 2017-06-10 DIAGNOSIS — K766 Portal hypertension: Secondary | ICD-10-CM

## 2017-06-10 DIAGNOSIS — K625 Hemorrhage of anus and rectum: Secondary | ICD-10-CM | POA: Diagnosis not present

## 2017-06-10 DIAGNOSIS — K921 Melena: Secondary | ICD-10-CM | POA: Diagnosis not present

## 2017-06-10 NOTE — Assessment & Plan Note (Signed)
Leading duodenal ulcer on EGD.  The patient has completed 4 weeks of Protonix and will be obtaining a refill on completing a second 4 weeks of Protonix.  He is also completing Carafate to completion.  No need for surveillance EGD for duodenal ulcer.  Return for follow-up in 2 months to further evaluate for any recurrent symptoms.

## 2017-06-10 NOTE — Patient Instructions (Addendum)
1. Have your labs checked when you are able to. 2. We will schedule your ultrasound for you. 3. Return for follow-up in 2 months. 4. Call us if you have any questions or concerns.   Happy Thanksgiving!!!

## 2017-06-10 NOTE — Addendum Note (Signed)
Addended by: Gordy Levan, Rubby Barbary A on: 06/10/2017 12:09 PM   Modules accepted: Orders

## 2017-06-10 NOTE — Assessment & Plan Note (Signed)
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  CT suggestive of possible underlying cirrhosis.  At this point we will check labs including CBC, CMP.  We will also check ultrasound elastography for degree of scarring, if any.  Return for follow-up in 2 months to review results and make further plans for further workup if needed.

## 2017-06-10 NOTE — Assessment & Plan Note (Signed)
Denies further rectal bleeding.  I will check a CBC to evaluate his hemoglobin since discharge.  Return for follow-up in 2 months.

## 2017-06-10 NOTE — Assessment & Plan Note (Signed)
No further melena noted.  CBC to check hemoglobin level.  Return for follow-up in 2 months.

## 2017-06-10 NOTE — Progress Notes (Signed)
Referring Provider: Sharilyn Sites, MD Primary Care Physician:  Sharilyn Sites, MD Primary GI:  Dr. Oneida Alar  Chief Complaint  Patient presents with  . Anemia    hosp f/u, doing ok    HPI:   Kyle Hicks is a 76 y.o. male who presents for posthospitalization follow-up.  Patient was admitted from 04/24/2017 through 04/26/2017 and then again from 04/27/2017 through 04/30/2017.  He was admitted for melena but refused endoscopy.  He returned for his second admission because he had a couple more black tarry stools now mixed with red blood in his hemoglobin was 8.9 on admission, 10.7 the day before when he was discharged.  He was transfused 1 unit PRBC.  GI was consulted for EGD.  EGD was completed 04/27/2017 which found normal esophagus, irregular Z line, portal hypertensive gastropathy, a single nonbleeding duodenal ulcer, normal second duodenum.  Recommended change PPI to infusion, Carafate as needed, H. pylori serology.  Recommended abdominal CT with contrast.  CT found cholelithiasis with markedly distended gallbladder, inflammatory changes concerning for acute cholecystitis, questionable liver surface nodularity which could reflect underlying cirrhosis (although no splenomegaly); recommended consider ultrasound, indeterminate 1.5 cm right adrenal nodule and recommended 20-month follow-up adrenal protocol CT.  The patient was evaluated by surgery and due to absence of gallbladder symptoms, no plans for gallbladder removal at that time.  Labs reviewed and during hospitalization his platelets were normal in the 178-200 range.  H. pylori serology negative.  Some kidney disease noted in his past medical history, creatinine 1.63 at most recent check, AST/ALT, alkaline phosphatase, bilirubin all normal.  Ferritin elevated at 602.  Iron low at 39, iron sat normal at 24.  Today he states he's doing well. Denies any further melena. He completed 4 weeks of Protonix. He has a refill and will get that refilled and  take to completion (8 weeks post-d/c PPI therapy). He is still taking Carafate and will take that to completion. Denies abdominal pain, N/V, hematochezia, fever, chills, unintentional weight loss. He has been trying to lose weight per Endocrinology recommendations. Denies yellowing of skin/eyes, acute episodic confusion, darkened urine, tremors. Denies chest pain, dyspnea, dizziness, lightheadedness, syncope, near syncope. Denies any other upper or lower GI symptoms.  He has had diabetes for "quite a while" and estimates 15-20 years.  Past Medical History:  Diagnosis Date  . Diabetes mellitus without complication ([place])   . HTN (hypertension)     Past Surgical History:  Procedure Laterality Date  . ESOPHAGOGASTRODUODENOSCOPY (EGD) N/A 04/27/2017   Performed by Rogene Houston, MD at [facility]    Current Outpatient Medications  Medication Sig Dispense Refill  . atorvastatin (LIPITOR) 20 MG tablet Take 20 mg by mouth daily.    . hydrochlorothiazide (HYDRODIURIL) 25 MG tablet Take 25 mg by mouth daily.    Marland Kitchen NIFEdipine (PROCARDIA-XL/ADALAT-CC/NIFEDICAL-XL) 30 MG 24 hr tablet take 1 tablet by mouth once daily 30 tablet 2  . pantoprazole (PROTONIX) 40 MG tablet Take 1 tablet (40 mg total) by mouth 2 (two) times daily. 60 tablet 1  . ramipril (ALTACE) 10 MG capsule take 1 capsule by mouth once daily 30 capsule 2  . sitaGLIPtin-metformin (JANUMET) 50-1000 MG tablet Take 0.5 tablets by mouth 2 (two) times daily. 30 tablet 2  . sucralfate (CARAFATE) 1 GM/10ML suspension Take 10 mLs (1 g total) by mouth 4 (four) times daily -  with meals and at bedtime. 420 mL 0   No current facility-administered medications for this visit.  Allergies as of 06/10/2017  . (No Known Allergies)    Family History  Problem Relation Age of Onset  . Hypertension Mother   . Hyperlipidemia Mother   . Hypertension Father   . Hyperlipidemia Father   . Colon cancer Neg Hx     Social History   Socioeconomic  History  . Marital status: Married    Spouse name: None  . Number of children: None  . Years of education: None  . Highest education level: None  Social Needs  . Financial resource strain: None  . Food insecurity - worry: None  . Food insecurity - inability: None  . Transportation needs - medical: None  . Transportation needs - non-medical: None  Occupational History  . None  Tobacco Use  . Smoking status: Former Smoker    Packs/day: 0.50    Types: Cigarettes    Last attempt to quit: 07/26/1987    Years since quitting: 29.8  . Smokeless tobacco: Never Used  Substance and Sexual Activity  . Alcohol use: No    Alcohol/week: 0.0 oz    Comment: Previously drank socially, last drank 25-30 years ago  . Drug use: No  . Sexual activity: None  Other Topics Concern  . None  Social History Narrative  . None    Review of Systems: Complete ROS negative except as per HPI.   Physical Exam: BP (!) 145/87   Pulse (!) 101   Temp 97.7 F (36.5 C) (Oral)   Ht 5' 6.5" (1.689 m)   Wt 242 lb 3.2 oz (109.9 kg)   BMI 38.51 kg/m  General:   Alert and oriented. Pleasant and cooperative. Well-nourished and well-developed.  Head:  Normocephalic and atraumatic. Eyes:  Without icterus, sclera clear and conjunctiva pink.  Ears:  Normal auditory acuity. Cardiovascular:  S1, S2 present without murmurs appreciated. Normal pulses noted. 1-2+ LE edema. Respiratory:  Clear to auscultation bilaterally. No wheezes, rales, or rhonchi. No distress.  Gastrointestinal:  +BS, obese but soft, non-tender and non-distended. No splenomegaly. Liver feels enlarged 3-4 fingerbreadths. No guarding or rebound. No masses appreciated.  Rectal:  Deferred  Musculoskalatal:  Symmetrical without gross deformities. Normal posture. Neurologic:  Alert and oriented x4;  grossly normal neurologically. Psych:  Alert and cooperative. Normal mood and affect. Heme/Lymph/Immune: No excessive bruising noted.    06/10/2017  11:50 AM   Disclaimer: This note was dictated with voice recognition software. Similar sounding words can inadvertently be transcribed and may not be corrected upon review.

## 2017-06-13 ENCOUNTER — Encounter: Payer: Self-pay | Admitting: Gastroenterology

## 2017-06-13 ENCOUNTER — Telehealth: Payer: Self-pay | Admitting: *Deleted

## 2017-06-13 ENCOUNTER — Other Ambulatory Visit: Payer: Self-pay | Admitting: *Deleted

## 2017-06-13 ENCOUNTER — Encounter: Payer: Self-pay | Admitting: *Deleted

## 2017-06-13 DIAGNOSIS — R9389 Abnormal findings on diagnostic imaging of other specified body structures: Secondary | ICD-10-CM

## 2017-06-13 DIAGNOSIS — K3189 Other diseases of stomach and duodenum: Secondary | ICD-10-CM

## 2017-06-13 DIAGNOSIS — K766 Portal hypertension: Principal | ICD-10-CM

## 2017-06-13 DIAGNOSIS — K7460 Unspecified cirrhosis of liver: Secondary | ICD-10-CM

## 2017-06-13 DIAGNOSIS — R935 Abnormal findings on diagnostic imaging of other abdominal regions, including retroperitoneum: Secondary | ICD-10-CM

## 2017-06-13 NOTE — Progress Notes (Signed)
cc'ed to pcp °

## 2017-06-13 NOTE — Telephone Encounter (Signed)
U/s elastography scheduled for 06/20/17 at 7:30am, arrival time 7:15am, NPO after midnight at [place]

## 2017-06-13 NOTE — Telephone Encounter (Signed)
Patient states he is unable to go at 7:30am. He is now scheduled for same day 06/20/17 at 10:30am, arrival time 10:15am, npo after midnight. Patient was fine with date/time. Nothing further needed

## 2017-06-20 ENCOUNTER — Ambulatory Visit (HOSPITAL_COMMUNITY): Admission: RE | Admit: 2017-06-20 | Payer: Medicare Other | Source: Ambulatory Visit

## 2017-06-20 ENCOUNTER — Ambulatory Visit (HOSPITAL_COMMUNITY): Payer: Medicare Other

## 2017-06-20 DIAGNOSIS — N182 Chronic kidney disease, stage 2 (mild): Secondary | ICD-10-CM | POA: Diagnosis not present

## 2017-06-20 DIAGNOSIS — K921 Melena: Secondary | ICD-10-CM | POA: Diagnosis not present

## 2017-06-20 DIAGNOSIS — K269 Duodenal ulcer, unspecified as acute or chronic, without hemorrhage or perforation: Secondary | ICD-10-CM | POA: Diagnosis not present

## 2017-06-20 DIAGNOSIS — K3189 Other diseases of stomach and duodenum: Secondary | ICD-10-CM | POA: Diagnosis not present

## 2017-06-20 DIAGNOSIS — E1165 Type 2 diabetes mellitus with hyperglycemia: Secondary | ICD-10-CM | POA: Diagnosis not present

## 2017-06-20 DIAGNOSIS — E119 Type 2 diabetes mellitus without complications: Secondary | ICD-10-CM | POA: Diagnosis not present

## 2017-06-20 DIAGNOSIS — K625 Hemorrhage of anus and rectum: Secondary | ICD-10-CM | POA: Diagnosis not present

## 2017-06-20 DIAGNOSIS — K766 Portal hypertension: Secondary | ICD-10-CM | POA: Diagnosis not present

## 2017-06-20 DIAGNOSIS — E782 Mixed hyperlipidemia: Secondary | ICD-10-CM | POA: Diagnosis not present

## 2017-06-20 DIAGNOSIS — E1122 Type 2 diabetes mellitus with diabetic chronic kidney disease: Secondary | ICD-10-CM | POA: Diagnosis not present

## 2017-06-20 LAB — CBC WITH DIFFERENTIAL/PLATELET
BASOS PCT: 0.5 %
Basophils Absolute: 29 cells/uL (ref 0–200)
EOS PCT: 7.4 %
Eosinophils Absolute: 422 cells/uL (ref 15–500)
HCT: 35.6 % — ABNORMAL LOW (ref 38.5–50.0)
HEMOGLOBIN: 11.5 g/dL — AB (ref 13.2–17.1)
Lymphs Abs: 1556 cells/uL (ref 850–3900)
MCH: 27.3 pg (ref 27.0–33.0)
MCHC: 32.3 g/dL (ref 32.0–36.0)
MCV: 84.4 fL (ref 80.0–100.0)
MONOS PCT: 9 %
MPV: 11.6 fL (ref 7.5–12.5)
NEUTROS ABS: 3181 {cells}/uL (ref 1500–7800)
Neutrophils Relative %: 55.8 %
Platelets: 209 10*3/uL (ref 140–400)
RBC: 4.22 10*6/uL (ref 4.20–5.80)
RDW: 13.2 % (ref 11.0–15.0)
Total Lymphocyte: 27.3 %
WBC mixed population: 513 cells/uL (ref 200–950)
WBC: 5.7 10*3/uL (ref 3.8–10.8)

## 2017-06-20 LAB — COMPREHENSIVE METABOLIC PANEL
AG Ratio: 1.4 (calc) (ref 1.0–2.5)
ALT: 10 U/L (ref 9–46)
AST: 10 U/L (ref 10–35)
Albumin: 4.1 g/dL (ref 3.6–5.1)
Alkaline phosphatase (APISO): 60 U/L (ref 40–115)
BUN / CREAT RATIO: 16 (calc) (ref 6–22)
BUN: 21 mg/dL (ref 7–25)
CHLORIDE: 104 mmol/L (ref 98–110)
CO2: 29 mmol/L (ref 20–32)
Calcium: 9 mg/dL (ref 8.6–10.3)
Creat: 1.29 mg/dL — ABNORMAL HIGH (ref 0.70–1.18)
GLOBULIN: 2.9 g/dL (ref 1.9–3.7)
GLUCOSE: 136 mg/dL — AB (ref 65–99)
Potassium: 3.8 mmol/L (ref 3.5–5.3)
SODIUM: 139 mmol/L (ref 135–146)
TOTAL PROTEIN: 7 g/dL (ref 6.1–8.1)
Total Bilirubin: 0.4 mg/dL (ref 0.2–1.2)

## 2017-06-21 LAB — MICROALBUMIN / CREATININE URINE RATIO
Creatinine, Urine: 226 mg/dL (ref 20–320)
MICROALB/CREAT RATIO: 5 ug/mg{creat} (ref <30)
Microalb, Ur: 1.2 mg/dL

## 2017-06-21 LAB — RENAL FUNCTION PANEL
ALBUMIN MSPROF: 4.1 g/dL (ref 3.6–5.1)
BUN/Creatinine Ratio: 16 (calc) (ref 6–22)
BUN: 21 mg/dL (ref 7–25)
CALCIUM: 9.1 mg/dL (ref 8.6–10.3)
CO2: 30 mmol/L (ref 20–32)
Chloride: 104 mmol/L (ref 98–110)
Creat: 1.29 mg/dL — ABNORMAL HIGH (ref 0.70–1.18)
Glucose, Bld: 136 mg/dL — ABNORMAL HIGH (ref 65–99)
PHOSPHORUS: 2.9 mg/dL (ref 2.1–4.3)
Potassium: 4 mmol/L (ref 3.5–5.3)
Sodium: 140 mmol/L (ref 135–146)

## 2017-06-21 LAB — LIPID PANEL
CHOL/HDL RATIO: 3 (calc) (ref <5.0)
CHOLESTEROL: 143 mg/dL (ref <200)
HDL: 47 mg/dL (ref >40)
LDL CHOLESTEROL (CALC): 80 mg/dL
Non-HDL Cholesterol (Calc): 96 mg/dL (calc) (ref <130)
Triglycerides: 75 mg/dL (ref <150)

## 2017-06-21 LAB — HEMOGLOBIN A1C
HEMOGLOBIN A1C: 5.6 %{Hb} (ref <5.7)
Mean Plasma Glucose: 114 (calc)
eAG (mmol/L): 6.3 (calc)

## 2017-06-22 ENCOUNTER — Ambulatory Visit (HOSPITAL_COMMUNITY)
Admission: RE | Admit: 2017-06-22 | Discharge: 2017-06-22 | Disposition: A | Discharge status: Home / Self Care | Payer: Medicare Other | Source: Ambulatory Visit | Attending: Nurse Practitioner | Admitting: Nurse Practitioner

## 2017-06-22 DIAGNOSIS — K7460 Unspecified cirrhosis of liver: Secondary | ICD-10-CM

## 2017-06-22 DIAGNOSIS — R935 Abnormal findings on diagnostic imaging of other abdominal regions, including retroperitoneum: Secondary | ICD-10-CM | POA: Diagnosis not present

## 2017-06-22 DIAGNOSIS — K8020 Calculus of gallbladder without cholecystitis without obstruction: Secondary | ICD-10-CM | POA: Diagnosis not present

## 2017-06-23 ENCOUNTER — Telehealth: Payer: Self-pay | Admitting: Nurse Practitioner

## 2017-06-23 DIAGNOSIS — R932 Abnormal findings on diagnostic imaging of liver and biliary tract: Secondary | ICD-10-CM

## 2017-06-23 DIAGNOSIS — K7460 Unspecified cirrhosis of liver: Secondary | ICD-10-CM

## 2017-06-23 NOTE — Progress Notes (Signed)
LMOM to call.

## 2017-06-23 NOTE — Telephone Encounter (Signed)
I have placed referral to general surgery. They will contact patient with an appt.

## 2017-06-23 NOTE — Telephone Encounter (Signed)
Noted  

## 2017-06-23 NOTE — Addendum Note (Signed)
Addended by: Inge Rise on: 06/23/2017 04:14 PM   Modules accepted: Orders

## 2017-06-23 NOTE — Telephone Encounter (Signed)
Please make referral to general surgery regarding abnormal gallbladder (I already spoke with Dr. Constance Haw who agreed to see the patient).

## 2017-06-23 NOTE — Telephone Encounter (Signed)
I spoke with the patient to relay the new test results including Elastography showing "some F3/F4" scarring indicating possible cirrhosis. Also ultrasound showing significant debris and marked dilation and "unable to rule out gallbladder mass." The patient repeatedly stated he had previously been told there was nothing to worry about. I reinforced the fact that these are NEW results giving [place] new information indicating that he may have cirrhosis and/or gallbladder cancer and it is recommended that he see a Psychologist, sport and exercise. He is declining all liver serology workup and surgical referral for abnormal gallbladder concerning for possible mass, against advice.  Please cancel surgical referral.

## 2017-06-23 NOTE — Telephone Encounter (Signed)
Labs ordered.

## 2017-06-23 NOTE — Telephone Encounter (Signed)
Noted! I have LMOM for a return call.

## 2017-06-23 NOTE — Progress Notes (Signed)
Spoke to pt and informed him and Randall Hiss wanted to speak with him. Call transferred to [facility].

## 2017-06-23 NOTE — Progress Notes (Signed)
See separate note. Pt declined further evaluation.

## 2017-06-23 NOTE — Telephone Encounter (Signed)
Per EG cancel the referral. I have done so.

## 2017-06-23 NOTE — Telephone Encounter (Signed)
I have cancelled referral

## 2017-06-28 ENCOUNTER — Encounter: Payer: Self-pay | Admitting: "Endocrinology

## 2017-06-28 ENCOUNTER — Ambulatory Visit (INDEPENDENT_AMBULATORY_CARE_PROVIDER_SITE_OTHER): Payer: Medicare Other | Admitting: "Endocrinology

## 2017-06-28 VITALS — BP 155/91 | HR 87 | Ht 66.5 in | Wt 246.0 lb

## 2017-06-28 DIAGNOSIS — E1165 Type 2 diabetes mellitus with hyperglycemia: Secondary | ICD-10-CM | POA: Diagnosis not present

## 2017-06-28 DIAGNOSIS — E1122 Type 2 diabetes mellitus with diabetic chronic kidney disease: Secondary | ICD-10-CM

## 2017-06-28 DIAGNOSIS — N182 Chronic kidney disease, stage 2 (mild): Secondary | ICD-10-CM

## 2017-06-28 DIAGNOSIS — E782 Mixed hyperlipidemia: Secondary | ICD-10-CM | POA: Diagnosis not present

## 2017-06-28 DIAGNOSIS — I10 Essential (primary) hypertension: Secondary | ICD-10-CM | POA: Diagnosis not present

## 2017-06-28 DIAGNOSIS — IMO0002 Reserved for concepts with insufficient information to code with codable children: Secondary | ICD-10-CM

## 2017-06-28 NOTE — Patient Instructions (Signed)

## 2017-06-28 NOTE — Progress Notes (Signed)
Subjective:    Patient ID: Kyle Hicks, male    DOB: 1940/11/07, PCP Sharilyn Sites, MD   Past Medical History:  Diagnosis Date  . Diabetes mellitus without complication ([place])   . HTN (hypertension)    Past Surgical History:  Procedure Laterality Date  . ESOPHAGOGASTRODUODENOSCOPY N/A 04/27/2017   Procedure: ESOPHAGOGASTRODUODENOSCOPY (EGD);  Surgeon: Rogene Houston, MD;  Location: [facility];  Service: Endoscopy;  Laterality: N/A;   Social History   Socioeconomic History  . Marital status: Married    Spouse name: None  . Number of children: None  . Years of education: None  . Highest education level: None  Social Needs  . Financial resource strain: None  . Food insecurity - worry: None  . Food insecurity - inability: None  . Transportation needs - medical: None  . Transportation needs - non-medical: None  Occupational History  . None  Tobacco Use  . Smoking status: Former Smoker    Packs/day: 0.50    Types: Cigarettes    Last attempt to quit: 07/26/1987    Years since quitting: 29.9  . Smokeless tobacco: Never Used  Substance and Sexual Activity  . Alcohol use: No    Alcohol/week: 0.0 oz    Comment: Previously drank socially, last drank 25-30 years ago  . Drug use: No  . Sexual activity: None  Other Topics Concern  . None  Social History Narrative  . None   Outpatient Encounter Medications as of 06/28/2017  Medication Sig  . atorvastatin (LIPITOR) 20 MG tablet Take 20 mg by mouth daily.  . hydrochlorothiazide (HYDRODIURIL) 25 MG tablet Take 25 mg by mouth daily.  Marland Kitchen NIFEdipine (PROCARDIA-XL/ADALAT-CC/NIFEDICAL-XL) 30 MG 24 hr tablet take 1 tablet by mouth once daily  . pantoprazole (PROTONIX) 40 MG tablet Take 1 tablet (40 mg total) by mouth 2 (two) times daily.  . ramipril (ALTACE) 10 MG capsule take 1 capsule by mouth once daily  . sitaGLIPtin-metformin (JANUMET) 50-1000 MG tablet Take 0.5 tablets by mouth 2 (two) times daily.  . sucralfate  (CARAFATE) 1 GM/10ML suspension Take 10 mLs (1 g total) by mouth 4 (four) times daily -  with meals and at bedtime.   No facility-administered encounter medications on file as of 06/28/2017.    ALLERGIES: No Known Allergies VACCINATION STATUS:  There is no immunization history on file for this patient.  Diabetes  He presents for his follow-up diabetic visit. He has type 2 diabetes mellitus. The initial diagnosis of diabetes was made 2 years ago. His disease course has been improving. There are no hypoglycemic associated symptoms. Pertinent negatives for hypoglycemia include no confusion, headaches, pallor or seizures. There are no diabetic associated symptoms. Pertinent negatives for diabetes include no chest pain, no fatigue, no polydipsia, no polyphagia, no polyuria and no weakness. There are no hypoglycemic complications. Symptoms are improving. Diabetic complications include nephropathy. Risk factors for coronary artery disease include diabetes mellitus, dyslipidemia, hypertension, male sex, obesity, sedentary lifestyle and tobacco exposure. He is compliant with treatment most of the time. His weight is decreasing steadily. He has had a previous visit with a dietitian. He never participates in exercise. An ACE inhibitor/angiotensin II receptor blocker is being taken.  Hypertension  This is a chronic problem. The current episode started more than 1 year ago. The problem is uncontrolled. Pertinent negatives include no chest pain, headaches, neck pain, palpitations or shortness of breath. Risk factors for coronary artery disease include diabetes mellitus, dyslipidemia, obesity, sedentary lifestyle  and smoking/tobacco exposure.  Hyperlipidemia  This is a chronic problem. The current episode started more than 1 year ago. Pertinent negatives include no chest pain, myalgias or shortness of breath. Current antihyperlipidemic treatment includes statins.     Review of Systems  Constitutional: Negative  for fatigue and unexpected weight change.  HENT: Negative for dental problem, mouth sores and trouble swallowing.   Eyes: Negative for visual disturbance.  Respiratory: Negative for cough, choking, chest tightness, shortness of breath and wheezing.   Cardiovascular: Negative for chest pain, palpitations and leg swelling.  Gastrointestinal: Negative for abdominal distention, abdominal pain, constipation, diarrhea, nausea and vomiting.  Endocrine: Negative for polydipsia, polyphagia and polyuria.  Genitourinary: Negative for dysuria, flank pain, hematuria and urgency.  Musculoskeletal: Negative for back pain, gait problem, myalgias and neck pain.  Skin: Negative for pallor, rash and wound.  Neurological: Negative for seizures, syncope, weakness, numbness and headaches.  Psychiatric/Behavioral: Negative.  Negative for confusion and dysphoric mood.    Objective:    BP (!) 155/91   Pulse 87   Ht 5' 6.5" (1.689 m)   Wt 246 lb (111.6 kg)   BMI 39.11 kg/m   Wt Readings from Last 3 Encounters:  06/28/17 246 lb (111.6 kg)  06/10/17 242 lb 3.2 oz (109.9 kg)  04/29/17 258 lb 14.4 oz (117.4 kg)    Physical Exam  Constitutional: He is oriented to person, place, and time. He appears well-developed and well-nourished. He is cooperative. No distress.  HENT:  Head: Normocephalic and atraumatic.  Eyes: EOM are normal.  Neck: Normal range of motion. Neck supple. No tracheal deviation present. No thyromegaly present.  Cardiovascular: Normal rate, S1 normal, S2 normal and normal heart sounds. Exam reveals no gallop.  No murmur heard. Pulses:      Dorsalis pedis pulses are 1+ on the right side, and 1+ on the left side.       Posterior tibial pulses are 1+ on the right side, and 1+ on the left side.  Pulmonary/Chest: Breath sounds normal. No respiratory distress. He has no wheezes.  Abdominal: Soft. Bowel sounds are normal. He exhibits no distension. There is no tenderness. There is no guarding and no  CVA tenderness.  Musculoskeletal: He exhibits no edema.       Right shoulder: He exhibits no swelling and no deformity.  Neurological: He is alert and oriented to person, place, and time. He has normal strength and normal reflexes. No cranial nerve deficit or sensory deficit. Gait normal.  Skin: Skin is warm and dry. No rash noted. No cyanosis. Nails show no clubbing.  Psychiatric: He has a normal mood and affect. His speech is normal and behavior is normal. Judgment and thought content normal. Cognition and memory are normal.    Results for orders placed or performed in visit on 06/10/17  CBC with Differential/Platelet  Result Value Ref Range   WBC 5.7 3.8 - 10.8 Thousand/uL   RBC 4.22 4.20 - 5.80 Million/uL   Hemoglobin 11.5 (L) 13.2 - 17.1 g/dL   HCT 35.6 (L) 38.5 - 50.0 %   MCV 84.4 80.0 - 100.0 fL   MCH 27.3 27.0 - 33.0 pg   MCHC 32.3 32.0 - 36.0 g/dL   RDW 13.2 11.0 - 15.0 %   Platelets 209 140 - 400 Thousand/uL   MPV 11.6 7.5 - 12.5 fL   Neutro Abs 3,181 1,500 - 7,800 cells/uL   Lymphs Abs 1,556 850 - 3,900 cells/uL   WBC mixed population 513 200 -  950 cells/uL   Eosinophils Absolute 422 15 - 500 cells/uL   Basophils Absolute 29 0 - 200 cells/uL   Neutrophils Relative % 55.8 %   Total Lymphocyte 27.3 %   Monocytes Relative 9.0 %   Eosinophils Relative 7.4 %   Basophils Relative 0.5 %  Comprehensive metabolic panel  Result Value Ref Range   Glucose, Bld 136 (H) 65 - 99 mg/dL   BUN 21 7 - 25 mg/dL   Creat 1.29 (H) 0.70 - 1.18 mg/dL   BUN/Creatinine Ratio 16 6 - 22 (calc)   Sodium 139 135 - 146 mmol/L   Potassium 3.8 3.5 - 5.3 mmol/L   Chloride 104 98 - 110 mmol/L   CO2 29 20 - 32 mmol/L   Calcium 9.0 8.6 - 10.3 mg/dL   Total Protein 7.0 6.1 - 8.1 g/dL   Albumin 4.1 3.6 - 5.1 g/dL   Globulin 2.9 1.9 - 3.7 g/dL (calc)   AG Ratio 1.4 1.0 - 2.5 (calc)   Total Bilirubin 0.4 0.2 - 1.2 mg/dL   Alkaline phosphatase (APISO) 60 40 - 115 U/L   AST 10 10 - 35 U/L   ALT 10 9  - 46 U/L   Diabetic Labs (most recent): Lab Results  Component Value Date   HGBA1C 5.6 06/20/2017   HGBA1C 7.0 (H) 04/27/2017   HGBA1C 7.9 (H) 03/07/2017   Lipid Panel     Component Value Date/Time   CHOL 143 06/20/2017 0727   TRIG 75 06/20/2017 0727   HDL 47 06/20/2017 0727   CHOLHDL 3.0 06/20/2017 0727   VLDL 14 01/19/2016 0719   LDLCALC 81 01/19/2016 0719     Assessment & Plan:   1. 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 These are all discussed in detail with the patient.  Patient came with  better A1c of 5.6% from 7.9% last visit, recent labs reviewed.  - I have re-counseled the patient on diet management and weight loss  by adopting a carbohydrate restricted / protein rich  Diet.  -  Suggestion is made for him to avoid simple carbohydrates  from his diet including Cakes, Sweet Desserts / Pastries, Ice Cream, Soda (diet and regular), Sweet Tea, Candies, Chips, Cookies, Store Bought Juices, Alcohol in Excess of  1-2 drinks a day, Artificial Sweeteners, and "Sugar-free" Products. This will help patient to have stable blood glucose profile and potentially avoid unintended weight gain.   - Patient is advised to stick to a routine mealtimes to eat 3 meals  a day and avoid unnecessary snacks ( to snack only to correct hypoglycemia).  - I have approached patient with the following individualized plan to manage diabetes and patient agrees.   - His renal function remains favorable to use Janumet.  -I will continue low dose of  Janumet 50/1000 mg ( 1/2 pil)  po BID. - He promises to do better on his diet and exercise. - He will not require additional therapy today.  - Patient specific target  for A1c; LDL, HDL, Triglycerides, and  Waist Circumference were discussed in detail.  2) BP/HTN: uncontrolled.  He reports that his  blood pressure readings higher in doctor's offices. I advised him to resume andContinue current medications including ACEI/ARB. 3) Lipids/HPL:  Controlled with LDL 80. I advised him to continue statins. 4)  Weight/Diet: CDE consult in  progress, exercise, and carbohydrates information provided.  5) Chronic Care/Health Maintenance:  -Patient is on ACEI/ARB and Statin medications and encouraged to continue to follow up with Ophthalmology, Podiatrist at least yearly or according to recommendations, and advised to stay away from smoking. I have recommended yearly flu vaccine and pneumonia vaccination at least every 5 years; moderate intensity exercise for up to 150 minutes weekly; and  sleep for at least 7 hours a day.  - I advised patient to maintain close follow up with Sharilyn Sites, MD for primary care needs.  Follow up plan: -Return in about 3 months (around 09/26/2017) for follow up with pre-visit labs.  Glade Lloyd, MD Phone: [phone]  Fax: [phone]  -  This note was partially dictated with voice recognition software. Similar sounding words can be transcribed inadequately or may not  be corrected upon review.  06/28/2017, 10:29 AM

## 2017-06-30 ENCOUNTER — Other Ambulatory Visit: Payer: Self-pay | Admitting: "Endocrinology

## 2017-07-06 NOTE — Progress Notes (Signed)
Pt is aware of results. 

## 2017-08-15 ENCOUNTER — Ambulatory Visit: Payer: Medicare Other | Admitting: Nurse Practitioner

## 2017-09-19 DIAGNOSIS — E1165 Type 2 diabetes mellitus with hyperglycemia: Secondary | ICD-10-CM | POA: Diagnosis not present

## 2017-09-19 DIAGNOSIS — E1122 Type 2 diabetes mellitus with diabetic chronic kidney disease: Secondary | ICD-10-CM | POA: Diagnosis not present

## 2017-09-19 DIAGNOSIS — E119 Type 2 diabetes mellitus without complications: Secondary | ICD-10-CM | POA: Diagnosis not present

## 2017-09-19 DIAGNOSIS — N182 Chronic kidney disease, stage 2 (mild): Secondary | ICD-10-CM | POA: Diagnosis not present

## 2017-09-20 LAB — HEMOGLOBIN A1C
EAG (MMOL/L): 8.4 (calc)
Hgb A1c MFr Bld: 6.9 % of total Hgb — ABNORMAL HIGH (ref <5.7)
MEAN PLASMA GLUCOSE: 151 (calc)

## 2017-09-20 LAB — COMPLETE METABOLIC PANEL WITH GFR
AG RATIO: 1.4 (calc) (ref 1.0–2.5)
ALT: 19 U/L (ref 9–46)
AST: 14 U/L (ref 10–35)
Albumin: 4.3 g/dL (ref 3.6–5.1)
Alkaline phosphatase (APISO): 80 U/L (ref 40–115)
BUN/Creatinine Ratio: 15 (calc) (ref 6–22)
BUN: 21 mg/dL (ref 7–25)
CALCIUM: 9.3 mg/dL (ref 8.6–10.3)
CO2: 27 mmol/L (ref 20–32)
Chloride: 106 mmol/L (ref 98–110)
Creat: 1.38 mg/dL — ABNORMAL HIGH (ref 0.70–1.18)
GFR, EST AFRICAN AMERICAN: 58 mL/min/{1.73_m2} — AB (ref >=60)
GFR, EST NON AFRICAN AMERICAN: 50 mL/min/{1.73_m2} — AB (ref >=60)
GLOBULIN: 3 g/dL (ref 1.9–3.7)
Glucose, Bld: 142 mg/dL — ABNORMAL HIGH (ref 65–99)
POTASSIUM: 4.2 mmol/L (ref 3.5–5.3)
SODIUM: 142 mmol/L (ref 135–146)
TOTAL PROTEIN: 7.3 g/dL (ref 6.1–8.1)
Total Bilirubin: 0.5 mg/dL (ref 0.2–1.2)

## 2017-09-28 ENCOUNTER — Ambulatory Visit (INDEPENDENT_AMBULATORY_CARE_PROVIDER_SITE_OTHER): Payer: Medicare HMO | Admitting: "Endocrinology

## 2017-09-28 ENCOUNTER — Encounter: Payer: Self-pay | Admitting: "Endocrinology

## 2017-09-28 VITALS — BP 130/74 | HR 94 | Ht 66.5 in | Wt 254.0 lb

## 2017-09-28 DIAGNOSIS — N182 Chronic kidney disease, stage 2 (mild): Secondary | ICD-10-CM

## 2017-09-28 DIAGNOSIS — I10 Essential (primary) hypertension: Secondary | ICD-10-CM | POA: Diagnosis not present

## 2017-09-28 DIAGNOSIS — E1122 Type 2 diabetes mellitus with diabetic chronic kidney disease: Secondary | ICD-10-CM

## 2017-09-28 DIAGNOSIS — E782 Mixed hyperlipidemia: Secondary | ICD-10-CM | POA: Diagnosis not present

## 2017-09-28 DIAGNOSIS — E1165 Type 2 diabetes mellitus with hyperglycemia: Secondary | ICD-10-CM

## 2017-09-28 DIAGNOSIS — IMO0002 Reserved for concepts with insufficient information to code with codable children: Secondary | ICD-10-CM

## 2017-09-28 NOTE — Progress Notes (Signed)
Subjective:    Patient ID: Kyle Hicks, male    DOB: [birth year], PCP Sharilyn Sites, MD   Past Medical History:  Diagnosis Date  . Diabetes mellitus without complication ([place])   . HTN (hypertension)    Past Surgical History:  Procedure Laterality Date  . ESOPHAGOGASTRODUODENOSCOPY N/A 04/27/2017   Procedure: ESOPHAGOGASTRODUODENOSCOPY (EGD);  Surgeon: Rogene Houston, MD;  Location: [facility];  Service: Endoscopy;  Laterality: N/A;   Social History   Socioeconomic History  . Marital status: Married    Spouse name: None  . Number of children: None  . Years of education: None  . Highest education level: None  Social Needs  . Financial resource strain: None  . Food insecurity - worry: None  . Food insecurity - inability: None  . Transportation needs - medical: None  . Transportation needs - non-medical: None  Occupational History  . None  Tobacco Use  . Smoking status: Former Smoker    Packs/day: 0.50    Types: Cigarettes    Last attempt to quit: 07/26/1987    Years since quitting: 30.1  . Smokeless tobacco: Never Used  Substance and Sexual Activity  . Alcohol use: No    Alcohol/week: 0.0 oz    Comment: Previously drank socially, last drank 25-30 years ago  . Drug use: No  . Sexual activity: None  Other Topics Concern  . None  Social History Narrative  . None   Outpatient Encounter Medications as of 09/28/2017  Medication Sig  . atorvastatin (LIPITOR) 20 MG tablet Take 20 mg by mouth daily.  . hydrochlorothiazide (HYDRODIURIL) 25 MG tablet Take 25 mg by mouth daily.  Marland Kitchen JANUMET 50-1000 MG tablet take 1/2 tablet twice a day  . NIFEdipine (PROCARDIA-XL/ADALAT-CC/NIFEDICAL-XL) 30 MG 24 hr tablet take 1 tablet by mouth once daily  . pantoprazole (PROTONIX) 40 MG tablet Take 1 tablet (40 mg total) by mouth 2 (two) times daily.  . ramipril (ALTACE) 10 MG capsule take 1 capsule by mouth once daily  . sucralfate (CARAFATE) 1 GM/10ML suspension Take 10 mLs (1 g  total) by mouth 4 (four) times daily -  with meals and at bedtime.   No facility-administered encounter medications on file as of 09/28/2017.    ALLERGIES: No Known Allergies VACCINATION STATUS:  There is no immunization history on file for this patient.  Diabetes  He presents for his follow-up diabetic visit. He has type 2 diabetes mellitus. The initial diagnosis of diabetes was made 2 years ago. His disease course has been worsening. There are no hypoglycemic associated symptoms. Pertinent negatives for hypoglycemia include no confusion, headaches, pallor or seizures. There are no diabetic associated symptoms. Pertinent negatives for diabetes include no chest pain, no fatigue, no polydipsia, no polyphagia, no polyuria and no weakness. There are no hypoglycemic complications. Symptoms are worsening. Diabetic complications include nephropathy. Risk factors for coronary artery disease include diabetes mellitus, dyslipidemia, hypertension, male sex, obesity, sedentary lifestyle and tobacco exposure. He is compliant with treatment most of the time. His weight is increasing steadily. He is following a generally unhealthy diet. He has had a previous visit with a dietitian. He never participates in exercise. An ACE inhibitor/angiotensin II receptor blocker is being taken.  Hypertension  This is a chronic problem. The current episode started more than 1 year ago. The problem is uncontrolled. Pertinent negatives include no chest pain, headaches, neck pain, palpitations or shortness of breath. Risk factors for coronary artery disease include diabetes mellitus, dyslipidemia,  obesity, sedentary lifestyle and smoking/tobacco exposure. Past treatments include angiotensin blockers.  Hyperlipidemia  This is a chronic problem. The current episode started more than 1 year ago. Exacerbating diseases include diabetes and obesity. Pertinent negatives include no chest pain, myalgias or shortness of breath. Current  antihyperlipidemic treatment includes statins. Risk factors for coronary artery disease include diabetes mellitus, dyslipidemia, hypertension, family history, male sex, obesity and a sedentary lifestyle.     Review of Systems  Constitutional: Negative for fatigue and unexpected weight change.  HENT: Negative for dental problem, mouth sores and trouble swallowing.   Eyes: Negative for visual disturbance.  Respiratory: Negative for cough, choking, chest tightness, shortness of breath and wheezing.   Cardiovascular: Negative for chest pain, palpitations and leg swelling.  Gastrointestinal: Negative for abdominal distention, abdominal pain, constipation, diarrhea, nausea and vomiting.  Endocrine: Negative for polydipsia, polyphagia and polyuria.  Genitourinary: Negative for dysuria, flank pain, hematuria and urgency.  Musculoskeletal: Negative for back pain, gait problem, myalgias and neck pain.  Skin: Negative for pallor, rash and wound.  Neurological: Negative for seizures, syncope, weakness, numbness and headaches.  Psychiatric/Behavioral: Negative.  Negative for confusion and dysphoric mood.    Objective:    BP 130/74   Pulse 94   Ht 5' 6.5" (1.689 m)   Wt 254 lb (115.2 kg)   BMI 40.38 kg/m   Wt Readings from Last 3 Encounters:  09/28/17 254 lb (115.2 kg)  06/28/17 246 lb (111.6 kg)  06/10/17 242 lb 3.2 oz (109.9 kg)    Physical Exam  Constitutional: He is oriented to person, place, and time. He appears well-developed and well-nourished. He is cooperative. No distress.  HENT:  Head: Normocephalic and atraumatic.  Eyes: EOM are normal.  Neck: Normal range of motion. Neck supple. No tracheal deviation present. No thyromegaly present.  Cardiovascular: Normal rate, S1 normal, S2 normal and normal heart sounds. Exam reveals no gallop.  No murmur heard. Pulses:      Dorsalis pedis pulses are 1+ on the right side, and 1+ on the left side.       Posterior tibial pulses are 1+ on the  right side, and 1+ on the left side.  Pulmonary/Chest: Breath sounds normal. No respiratory distress. He has no wheezes.  Abdominal: Soft. Bowel sounds are normal. He exhibits no distension. There is no tenderness. There is no guarding and no CVA tenderness.  Musculoskeletal: He exhibits no edema.       Right shoulder: He exhibits no swelling and no deformity.  Neurological: He is alert and oriented to person, place, and time. He has normal strength and normal reflexes. No cranial nerve deficit or sensory deficit. Gait normal.  Skin: Skin is warm and dry. No rash noted. No cyanosis. Nails show no clubbing.  Psychiatric: He has a normal mood and affect. His speech is normal and behavior is normal. Judgment and thought content normal. Cognition and memory are normal.    Results for orders placed or performed in visit on 06/28/17  COMPLETE METABOLIC PANEL WITH GFR  Result Value Ref Range   Glucose, Bld 142 (H) 65 - 99 mg/dL   BUN 21 7 - 25 mg/dL   Creat 1.38 (H) 0.70 - 1.18 mg/dL   GFR, Est Non African American 50 (L) > OR = 60 mL/min/1.36m2   GFR, Est African American 58 (L) > OR = 60 mL/min/1.16m2   BUN/Creatinine Ratio 15 6 - 22 (calc)   Sodium 142 135 - 146 mmol/L   Potassium  4.2 3.5 - 5.3 mmol/L   Chloride 106 98 - 110 mmol/L   CO2 27 20 - 32 mmol/L   Calcium 9.3 8.6 - 10.3 mg/dL   Total Protein 7.3 6.1 - 8.1 g/dL   Albumin 4.3 3.6 - 5.1 g/dL   Globulin 3.0 1.9 - 3.7 g/dL (calc)   AG Ratio 1.4 1.0 - 2.5 (calc)   Total Bilirubin 0.5 0.2 - 1.2 mg/dL   Alkaline phosphatase (APISO) 80 40 - 115 U/L   AST 14 10 - 35 U/L   ALT 19 9 - 46 U/L  Hemoglobin A1c  Result Value Ref Range   Hgb A1c MFr Bld 6.9 (H) <5.7 % of total Hgb   Mean Plasma Glucose 151 (calc)   eAG (mmol/L) 8.4 (calc)   Diabetic Labs (most recent): Lab Results  Component Value Date   HGBA1C 6.9 (H) 09/19/2017   HGBA1C 5.6 06/20/2017   HGBA1C 7.0 (H) 04/27/2017   Lipid Panel     Component Value Date/Time    CHOL 143 06/20/2017 0727   TRIG 75 06/20/2017 0727   HDL 47 06/20/2017 0727   CHOLHDL 3.0 06/20/2017 0727   VLDL 14 01/19/2016 0719   LDLCALC 81 01/19/2016 0719     Assessment & Plan:   1. 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 These are all discussed in detail with the patient.  Patient came with higher A1c of 6.9% increasing from 5.6%.   - recent labs reviewed.  - I have re-counseled the patient on diet management and weight loss  by adopting a carbohydrate restricted / protein rich  Diet.  -  Suggestion is made for him to avoid simple carbohydrates  from his diet including Cakes, Sweet Desserts / Pastries, Ice Cream, Soda (diet and regular), Sweet Tea, Candies, Chips, Cookies, Store Bought Juices, Alcohol in Excess of  1-2 drinks a day, Artificial Sweeteners, and "Sugar-free" Products. This will help patient to have stable blood glucose profile and potentially avoid unintended weight gain.   - Patient is advised to stick to a routine mealtimes to eat 3 meals  a day and avoid unnecessary snacks ( to snack only to correct hypoglycemia).  - I have approached patient with the following individualized plan to manage diabetes and patient agrees.   - His renal function remains favorable to use Janumet.  -I will continue low dose of  Janumet 50/1000 mg ( 1/2 pil)  po BID. -He will be considered for basal insulin if he continues to lose control of diabetes. - He promises to do better on his diet and exercise. - Patient specific target  for A1c; LDL, HDL, Triglycerides, and  Waist Circumference were discussed in detail.  2) BP/HTN: His blood pressure is controlled to target.   I advised him to continue his current medications including ACEI/ARB. 3) Lipids/HPL:  Controlled with LDL 80. I advised him to continue statins. 4)  Weight/Diet:  CDE consult in progress, exercise, and carbohydrates information provided.  5) Chronic Care/Health Maintenance:  -Patient is on ACEI/ARB and Statin medications and encouraged to continue to follow up with Ophthalmology, Podiatrist at least yearly or according to recommendations, and advised to stay away from smoking. I have recommended yearly flu vaccine and pneumonia vaccination at least every 5 years; moderate intensity exercise for up to 150 minutes weekly; and  sleep for  at least 7 hours a day.  - I advised patient to maintain close follow up with Sharilyn Sites, MD for primary care needs. - Time spent with the patient: 25 min, of which >50% was spent in reviewing his blood glucose logs , discussing his hypo- and hyper-glycemic episodes, reviewing his current and  previous labs and insulin doses and developing a plan to avoid hypo- and hyper-glycemia. Please refer to Patient Instructions for Blood Glucose Monitoring and Insulin/Medications Dosing Guide"  in media tab for additional information. Follow up plan: -Return in about 4 months (around 01/28/2018) for meter, and logs.  Glade Lloyd, MD Phone: [phone]  Fax: [phone]  -  This note was partially dictated with voice recognition software. Similar sounding words can be transcribed inadequately or may not  be corrected upon review.  09/28/2017, 12:05 PM

## 2017-09-28 NOTE — Patient Instructions (Signed)

## 2017-09-29 ENCOUNTER — Other Ambulatory Visit: Payer: Self-pay

## 2017-09-29 MED ORDER — SITAGLIPTIN PHOS-METFORMIN HCL 50-1000 MG PO TABS: 0.5000 | ORAL_TABLET | Freq: Two times a day (BID) | ORAL | 2 refills | Status: DC

## 2017-12-21 ENCOUNTER — Other Ambulatory Visit: Payer: Self-pay

## 2017-12-21 MED ORDER — HYDROCHLOROTHIAZIDE 25 MG PO TABS: 25.0000 mg | ORAL_TABLET | Freq: Every day | ORAL | 2 refills | Status: DC

## 2017-12-28 ENCOUNTER — Other Ambulatory Visit: Payer: Self-pay | Admitting: "Endocrinology

## 2018-01-30 ENCOUNTER — Ambulatory Visit: Payer: Medicare HMO | Admitting: "Endocrinology

## 2018-02-03 ENCOUNTER — Other Ambulatory Visit: Payer: Self-pay

## 2018-02-03 ENCOUNTER — Emergency Department (HOSPITAL_COMMUNITY)
Admission: EM | Admit: 2018-02-03 | Discharge: 2018-02-03 | Disposition: A | Discharge status: Home / Self Care | Payer: Medicare HMO | Attending: Emergency Medicine | Admitting: Emergency Medicine

## 2018-02-03 ENCOUNTER — Encounter (HOSPITAL_COMMUNITY): Payer: Self-pay | Admitting: Emergency Medicine

## 2018-02-03 DIAGNOSIS — Z87891 Personal history of nicotine dependence: Secondary | ICD-10-CM | POA: Diagnosis not present

## 2018-02-03 DIAGNOSIS — I129 Hypertensive chronic kidney disease with stage 1 through stage 4 chronic kidney disease, or unspecified chronic kidney disease: Secondary | ICD-10-CM | POA: Insufficient documentation

## 2018-02-03 DIAGNOSIS — M79631 Pain in right forearm: Secondary | ICD-10-CM | POA: Diagnosis not present

## 2018-02-03 DIAGNOSIS — E1122 Type 2 diabetes mellitus with diabetic chronic kidney disease: Secondary | ICD-10-CM | POA: Insufficient documentation

## 2018-02-03 DIAGNOSIS — Z79899 Other long term (current) drug therapy: Secondary | ICD-10-CM | POA: Insufficient documentation

## 2018-02-03 DIAGNOSIS — M79601 Pain in right arm: Secondary | ICD-10-CM | POA: Diagnosis not present

## 2018-02-03 DIAGNOSIS — N182 Chronic kidney disease, stage 2 (mild): Secondary | ICD-10-CM | POA: Diagnosis not present

## 2018-02-03 MED ORDER — DICLOFENAC SODIUM 1 % TD GEL: TRANSDERMAL | 0 refills | Status: DC

## 2018-02-03 MED ORDER — LIDOCAINE 5 % EX PTCH: 1.0000 | MEDICATED_PATCH | CUTANEOUS | 0 refills | Status: DC
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
## 2018-02-06 ENCOUNTER — Telehealth: Payer: Self-pay | Admitting: "Endocrinology

## 2018-02-06 DIAGNOSIS — N182 Chronic kidney disease, stage 2 (mild): Secondary | ICD-10-CM | POA: Diagnosis not present

## 2018-02-06 DIAGNOSIS — E1122 Type 2 diabetes mellitus with diabetic chronic kidney disease: Secondary | ICD-10-CM | POA: Diagnosis not present

## 2018-02-06 DIAGNOSIS — E119 Type 2 diabetes mellitus without complications: Secondary | ICD-10-CM | POA: Diagnosis not present

## 2018-02-06 DIAGNOSIS — E1165 Type 2 diabetes mellitus with hyperglycemia: Secondary | ICD-10-CM | POA: Diagnosis not present

## 2018-02-06 MED ORDER — RAMIPRIL 10 MG PO CAPS: 10.0000 mg | ORAL_CAPSULE | Freq: Every day | ORAL | 2 refills | Status: DC

## 2018-02-06 NOTE — Telephone Encounter (Signed)
Ivo is asking for a refill on ramipril (ALTACE) 10 MG capsule patient is completely out of medication, please advise? Send to [facility] on [address]

## 2018-02-07 LAB — COMPLETE METABOLIC PANEL WITH GFR
AG Ratio: 1.3 (calc) (ref 1.0–2.5)
ALKALINE PHOSPHATASE (APISO): 71 U/L (ref 40–115)
ALT: 13 U/L (ref 9–46)
AST: 10 U/L (ref 10–35)
Albumin: 4.1 g/dL (ref 3.6–5.1)
BUN/Creatinine Ratio: 14 (calc) (ref 6–22)
BUN: 21 mg/dL (ref 7–25)
CALCIUM: 9.1 mg/dL (ref 8.6–10.3)
CHLORIDE: 102 mmol/L (ref 98–110)
CO2: 24 mmol/L (ref 20–32)
Creat: 1.51 mg/dL — ABNORMAL HIGH (ref 0.70–1.18)
GFR, EST NON AFRICAN AMERICAN: 45 mL/min/{1.73_m2} — AB (ref >=60)
GFR, Est African American: 52 mL/min/{1.73_m2} — ABNORMAL LOW (ref >=60)
Globulin: 3.1 g/dL (calc) (ref 1.9–3.7)
Glucose, Bld: 203 mg/dL — ABNORMAL HIGH (ref 65–99)
POTASSIUM: 3.9 mmol/L (ref 3.5–5.3)
Sodium: 136 mmol/L (ref 135–146)
Total Bilirubin: 0.4 mg/dL (ref 0.2–1.2)
Total Protein: 7.2 g/dL (ref 6.1–8.1)

## 2018-02-07 LAB — HEMOGLOBIN A1C
Hgb A1c MFr Bld: 8.8 % of total Hgb — ABNORMAL HIGH (ref <5.7)
Mean Plasma Glucose: 206 (calc)
eAG (mmol/L): 11.4 (calc)

## 2018-02-13 ENCOUNTER — Ambulatory Visit (INDEPENDENT_AMBULATORY_CARE_PROVIDER_SITE_OTHER): Payer: Medicare HMO | Admitting: "Endocrinology

## 2018-02-13 ENCOUNTER — Encounter: Payer: Self-pay | Admitting: "Endocrinology

## 2018-02-13 VITALS — BP 149/90 | HR 86 | Ht 66.5 in | Wt 255.0 lb

## 2018-02-13 DIAGNOSIS — E782 Mixed hyperlipidemia: Secondary | ICD-10-CM | POA: Diagnosis not present

## 2018-02-13 DIAGNOSIS — N182 Chronic kidney disease, stage 2 (mild): Secondary | ICD-10-CM | POA: Diagnosis not present

## 2018-02-13 DIAGNOSIS — E1165 Type 2 diabetes mellitus with hyperglycemia: Secondary | ICD-10-CM

## 2018-02-13 DIAGNOSIS — R69 Illness, unspecified: Secondary | ICD-10-CM | POA: Diagnosis not present

## 2018-02-13 DIAGNOSIS — E1122 Type 2 diabetes mellitus with diabetic chronic kidney disease: Secondary | ICD-10-CM | POA: Diagnosis not present

## 2018-02-13 DIAGNOSIS — I10 Essential (primary) hypertension: Secondary | ICD-10-CM | POA: Diagnosis not present

## 2018-02-13 DIAGNOSIS — IMO0002 Reserved for concepts with insufficient information to code with codable children: Secondary | ICD-10-CM

## 2018-02-13 MED ORDER — ACCU-CHEK MULTICLIX LANCETS MISC: 3 refills | Status: DC

## 2018-02-13 MED ORDER — ACCU-CHEK GUIDE W/DEVICE KIT
1.0000 | PACK | 0 refills | Status: DC
Start: 2018-02-13 — End: 2018-02-17

## 2018-02-13 MED ORDER — GLIPIZIDE 5 MG PO TABS: 5.0000 mg | ORAL_TABLET | Freq: Two times a day (BID) | ORAL | 3 refills | Status: DC

## 2018-02-13 MED ORDER — GLUCOSE BLOOD VI STRP: ORAL_STRIP | 3 refills | Status: DC

## 2018-02-13 NOTE — Patient Instructions (Signed)

## 2018-02-13 NOTE — Progress Notes (Signed)
Subjective:    Patient ID: Kyle Hicks, male    DOB: [birth year], PCP Sharilyn Sites, MD   Past Medical History:  Diagnosis Date  . Diabetes mellitus without complication ([place])   . HTN (hypertension)    Past Surgical History:  Procedure Laterality Date  . ESOPHAGOGASTRODUODENOSCOPY N/A 04/27/2017   Procedure: ESOPHAGOGASTRODUODENOSCOPY (EGD);  Surgeon: Rogene Houston, MD;  Location: [facility] END[facility];  Service: Endoscopy;  Laterality: N/A;   Social History   Socioeconomic History  . Marital status: Married    Spouse name: Not on file  . Number of children: Not on file  . Years of education: Not on file  . Highest education level: Not on file  Occupational History  . Not on file  Social Needs  . Financial resource strain: Not on file  . Food insecurity:    Worry: Not on file    Inability: Not on file  . Transportation needs:    Medical: Not on file    Non-medical: Not on file  Tobacco Use  . Smoking status: Former Smoker    Packs/day: 0.50    Types: Cigarettes    Last attempt to quit: 07/26/1987    Years since quitting: 30.5  . Smokeless tobacco: Never Used  Substance and Sexual Activity  . Alcohol use: No    Alcohol/week: 0.0 oz    Comment: Previously drank socially, last drank 25-30 years ago  . Drug use: No  . Sexual activity: Not on file  Lifestyle  . Physical activity:    Days per week: Not on file    Minutes per session: Not on file  . Stress: Not on file  Relationships  . Social connections:    Talks on phone: Not on file    Gets together: Not on file    Attends religious service: Not on file    Active member of club or organization: Not on file    Attends meetings of clubs or organizations: Not on file    Relationship status: Not on file  Other Topics Concern  . Not on file  Social History Narrative  . Not on file   Outpatient Encounter Medications as of 02/13/2018  Medication Sig  . atorvastatin (LIPITOR) 20 MG tablet Take 20 mg by mouth  daily.  . Blood Glucose Monitoring Suppl (ACCU-CHEK GUIDE) w/Device KIT 1 Piece by Does not apply route as directed.  . diclofenac sodium (VOLTAREN) 1 % GEL Apply 2 grams twice daily to any areas of pain  . glipiZIDE (GLUCOTROL) 5 MG tablet Take 1 tablet (5 mg total) by mouth 2 (two) times daily before a meal.  . glucose blood (ACCU-CHEK GUIDE) test strip Use as instructed  . hydrochlorothiazide (HYDRODIURIL) 25 MG tablet Take 1 tablet (25 mg total) by mouth daily.  Marland Kitchen JANUMET 50-1000 MG tablet TAKE 1/2 TABLET BY MOUTH TWICE DAILY  . Lancets (ACCU-CHEK MULTICLIX) lancets Use as instructed  . lidocaine (LIDODERM) 5 % Place 1 patch onto the skin daily. Remove & Discard patch within 12 hours.  Apply to area of pain.  Marland Kitchen NIFEdipine (PROCARDIA-XL/ADALAT-CC/NIFEDICAL-XL) 30 MG 24 hr tablet take 1 tablet by mouth once daily  . pantoprazole (PROTONIX) 40 MG tablet Take 1 tablet (40 mg total) by mouth 2 (two) times daily.  . ramipril (ALTACE) 10 MG capsule Take 1 capsule (10 mg total) by mouth daily.  . sucralfate (CARAFATE) 1 GM/10ML suspension Take 10 mLs (1 g total) by mouth 4 (four) times daily -  with meals and at bedtime.   No facility-administered encounter medications on file as of 02/13/2018.    ALLERGIES: No Known Allergies VACCINATION STATUS:  There is no immunization history on file for this patient.  Diabetes  He presents for his follow-up diabetic visit. He has type 2 diabetes mellitus. The initial diagnosis of diabetes was made 2 years ago. His disease course has been worsening. There are no hypoglycemic associated symptoms. Pertinent negatives for hypoglycemia include no confusion, headaches, pallor or seizures. There are no diabetic associated symptoms. Pertinent negatives for diabetes include no chest pain, no fatigue, no polydipsia, no polyphagia, no polyuria and no weakness. There are no hypoglycemic complications. Symptoms are worsening. Diabetic complications include nephropathy.  Risk factors for coronary artery disease include diabetes mellitus, dyslipidemia, hypertension, male sex, obesity, sedentary lifestyle and tobacco exposure. He is compliant with treatment most of the time. His weight is increasing steadily. He is following a generally unhealthy diet. He has had a previous visit with a dietitian. He never participates in exercise. An ACE inhibitor/angiotensin II receptor blocker is being taken.  Hypertension  This is a chronic problem. The current episode started more than 1 year ago. The problem is uncontrolled. Pertinent negatives include no chest pain, headaches, neck pain, palpitations or shortness of breath. Risk factors for coronary artery disease include diabetes mellitus, dyslipidemia, obesity, sedentary lifestyle and smoking/tobacco exposure. Past treatments include angiotensin blockers.  Hyperlipidemia  This is a chronic problem. The current episode started more than 1 year ago. Exacerbating diseases include diabetes and obesity. Pertinent negatives include no chest pain, myalgias or shortness of breath. Current antihyperlipidemic treatment includes statins. Risk factors for coronary artery disease include diabetes mellitus, dyslipidemia, hypertension, family history, male sex, obesity and a sedentary lifestyle.     Review of Systems  Constitutional: Negative for fatigue and unexpected weight change.  HENT: Negative for dental problem, mouth sores and trouble swallowing.   Eyes: Negative for visual disturbance.  Respiratory: Negative for cough, choking, chest tightness, shortness of breath and wheezing.   Cardiovascular: Negative for chest pain, palpitations and leg swelling.  Gastrointestinal: Negative for abdominal distention, abdominal pain, constipation, diarrhea, nausea and vomiting.  Endocrine: Negative for polydipsia, polyphagia and polyuria.  Genitourinary: Negative for dysuria, flank pain, hematuria and urgency.  Musculoskeletal: Negative for back  pain, gait problem, myalgias and neck pain.  Skin: Negative for pallor, rash and wound.  Neurological: Negative for seizures, syncope, weakness, numbness and headaches.  Psychiatric/Behavioral: Negative.  Negative for confusion and dysphoric mood.    Objective:    BP (!) 149/90   Pulse 86   Ht 5' 6.5" (1.689 m)   Wt 255 lb (115.7 kg)   BMI 40.54 kg/m   Wt Readings from Last 3 Encounters:  02/13/18 255 lb (115.7 kg)  02/03/18 256 lb (116.1 kg)  09/28/17 254 lb (115.2 kg)    Physical Exam  Constitutional: He is oriented to person, place, and time. He appears well-developed. He is cooperative. No distress.  HENT:  Head: Normocephalic and atraumatic.  Eyes: EOM are normal.  Neck: Normal range of motion. Neck supple. No tracheal deviation present. No thyromegaly present.  Cardiovascular: Normal rate, S1 normal and S2 normal. Exam reveals no gallop.  No murmur heard. Pulses:      Dorsalis pedis pulses are 1+ on the right side, and 1+ on the left side.       Posterior tibial pulses are 1+ on the right side, and 1+ on the left side.  Pulmonary/Chest: Effort normal. No respiratory distress. He has no wheezes.  Abdominal: He exhibits no distension. There is no tenderness. There is no guarding and no CVA tenderness.  Musculoskeletal: He exhibits no edema.       Right shoulder: He exhibits no swelling and no deformity.  Neurological: He is alert and oriented to person, place, and time. He has normal strength. No cranial nerve deficit or sensory deficit. Gait normal.  Skin: Skin is warm and dry. No rash noted. No cyanosis. Nails show no clubbing.  Psychiatric: He has a normal mood and affect. His speech is normal. Judgment normal. Cognition and memory are normal.    Results for orders placed or performed in visit on 09/28/17  COMPLETE METABOLIC PANEL WITH GFR  Result Value Ref Range   Glucose, Bld 203 (H) 65 - 99 mg/dL   BUN 21 7 - 25 mg/dL   Creat 1.51 (H) 0.70 - 1.18 mg/dL   GFR,  Est Non African American 45 (L) > OR = 60 mL/min/1.50m   GFR, Est African American 52 (L) > OR = 60 mL/min/1.758m  BUN/Creatinine Ratio 14 6 - 22 (calc)   Sodium 136 135 - 146 mmol/L   Potassium 3.9 3.5 - 5.3 mmol/L   Chloride 102 98 - 110 mmol/L   CO2 24 20 - 32 mmol/L   Calcium 9.1 8.6 - 10.3 mg/dL   Total Protein 7.2 6.1 - 8.1 g/dL   Albumin 4.1 3.6 - 5.1 g/dL   Globulin 3.1 1.9 - 3.7 g/dL (calc)   AG Ratio 1.3 1.0 - 2.5 (calc)   Total Bilirubin 0.4 0.2 - 1.2 mg/dL   Alkaline phosphatase (APISO) 71 40 - 115 U/L   AST 10 10 - 35 U/L   ALT 13 9 - 46 U/L  Hemoglobin A1c  Result Value Ref Range   Hgb A1c MFr Bld 8.8 (H) <5.7 % of total Hgb   Mean Plasma Glucose 206 (calc)   eAG (mmol/L) 11.4 (calc)   Diabetic Labs (most recent): Lab Results  Component Value Date   HGBA1C 8.8 (H) 02/06/2018   HGBA1C 6.9 (H) 09/19/2017   HGBA1C 5.6 06/20/2017   Lipid Panel     Component Value Date/Time   CHOL 143 06/20/2017 0727   TRIG 75 06/20/2017 0727   HDL 47 06/20/2017 0727   CHOLHDL 3.0 06/20/2017 0727   VLDL 14 01/19/2016 0719   LDLCALC 80 06/20/2017 0727     Assessment & Plan:   1. 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 These are all discussed in detail with the patient.  -He came with loss of control of diabetes with A1c of 8.8%, increasing from 6.9%.    -His  recent labs reviewed.  - I have re-counseled the patient on diet management and weight loss  by adopting a carbohydrate restricted / protein rich  Diet.  -  Suggestion is made for him to avoid simple carbohydrates  from his diet including Cakes, Sweet Desserts / Pastries, Ice Cream, Soda (diet and regular), Sweet Tea, Candies, Chips, Cookies, Store Bought Juices, Alcohol in Excess of  1-2 drinks a day, Artificial Sweeteners, and "Sugar-free" Products. This will  help patient to have stable blood glucose profile and potentially avoid unintended weight gain.   - Patient is advised to stick to a routine mealtimes to eat 3  meals  a day and avoid unnecessary snacks ( to snack only to correct hypoglycemia).  - I have approached patient with the following individualized plan to manage diabetes and patient agrees.  -Due to loss of control of diabetes, he was approached for more additional treatment with basal insulin.  Patient declines this offer. -I approached him for glipizide 5 mg p.o. twice daily associated with strict monitoring of blood glucose 2 times daily before breakfast and at bedtime. -I will continue low dose of  Janumet 50/1000 mg ( 1/2 pil)  po BID. - He promises to do better on his diet and exercise. - Patient specific target  for A1c; LDL, HDL, Triglycerides, and  Waist Circumference were discussed in detail.  2) BP/HTN: His blood pressure is not controlled to target.  He is advised to continue his current blood pressure medications including hydrochlorothiazide 25 mg p.o. daily, Procardia 30 mg p.o. daily, ramipril 10 mg p.o. daily.  He will be considered for higher dose of ramipril on next visit if blood pressure remains above target.  3) Lipids/HPL: His recent lipid panel showed controlled LDL at 80.  He is advised to continue atorvastatin 20 mg p.o. nightly.   4)  Weight/Diet: CDE consult in progress, exercise, and carbohydrates information provided.  5) Chronic Care/Health Maintenance:  -Patient is on ACEI/ARB and Statin medications and encouraged to continue to follow up with Ophthalmology, Podiatrist at least yearly or according to recommendations, and advised to stay away from smoking. I have recommended yearly flu vaccine and pneumonia vaccination at least every 5 years; moderate intensity exercise for up to 150 minutes weekly; and  sleep for at least 7 hours a day.  - I advised patient to maintain close follow up with Sharilyn Sites,  MD for primary care needs.  - Time spent with the patient: 25 min, of which >50% was spent in reviewing his  current and  previous labs, previous treatments, and medications doses and developing a plan for long-term care.  Kyle Hicks participated in the discussions, expressed understanding, and voiced agreement with the above plans.  All questions were answered to his satisfaction. he is encouraged to contact clinic should he have any questions or concerns prior to his return visit.   Follow up plan: -Return in about 3 months (around 05/16/2018).  Glade Lloyd, MD Phone: [phone]  Fax: [phone]  -  This note was partially dictated with voice recognition software. Similar sounding words can be transcribed inadequately or may not  be corrected upon review.  02/13/2018, 9:20 AM

## 2018-02-17 ENCOUNTER — Other Ambulatory Visit: Payer: Self-pay

## 2018-02-17 MED ORDER — GLUCOSE BLOOD VI STRP: ORAL_STRIP | 3 refills | Status: DC

## 2018-02-17 MED ORDER — ACCU-CHEK GUIDE W/DEVICE KIT: 1.0000 | PACK | Freq: Two times a day (BID) | 0 refills | Status: DC

## 2018-02-17 MED ORDER — ACCU-CHEK MULTICLIX LANCETS MISC
3 refills | Status: DC
Start: 2018-02-17 — End: 2021-03-25

## 2018-02-20 DIAGNOSIS — R69 Illness, unspecified: Secondary | ICD-10-CM | POA: Diagnosis not present

## 2018-02-22 ENCOUNTER — Other Ambulatory Visit: Payer: Self-pay | Admitting: "Endocrinology

## 2018-04-09 DIAGNOSIS — R69 Illness, unspecified: Secondary | ICD-10-CM | POA: Diagnosis not present

## 2018-04-12 ENCOUNTER — Other Ambulatory Visit: Payer: Self-pay

## 2018-04-12 DIAGNOSIS — R69 Illness, unspecified: Secondary | ICD-10-CM | POA: Diagnosis not present

## 2018-04-12 MED ORDER — BLOOD GLUCOSE MONITOR KIT: PACK | 5 refills | Status: AC

## 2018-04-21 DIAGNOSIS — Z23 Encounter for immunization: Secondary | ICD-10-CM | POA: Diagnosis not present

## 2018-04-24 ENCOUNTER — Other Ambulatory Visit: Payer: Self-pay | Admitting: "Endocrinology

## 2018-05-04 ENCOUNTER — Other Ambulatory Visit: Payer: Self-pay | Admitting: "Endocrinology

## 2018-05-12 DIAGNOSIS — E1122 Type 2 diabetes mellitus with diabetic chronic kidney disease: Secondary | ICD-10-CM | POA: Diagnosis not present

## 2018-05-12 DIAGNOSIS — E1165 Type 2 diabetes mellitus with hyperglycemia: Secondary | ICD-10-CM | POA: Diagnosis not present

## 2018-05-12 DIAGNOSIS — N182 Chronic kidney disease, stage 2 (mild): Secondary | ICD-10-CM | POA: Diagnosis not present

## 2018-05-12 DIAGNOSIS — E119 Type 2 diabetes mellitus without complications: Secondary | ICD-10-CM | POA: Diagnosis not present

## 2018-05-13 LAB — COMPLETE METABOLIC PANEL WITH GFR
AG Ratio: 1.4 (calc) (ref 1.0–2.5)
ALBUMIN MSPROF: 4.2 g/dL (ref 3.6–5.1)
ALT: 14 U/L (ref 9–46)
AST: 12 U/L (ref 10–35)
Alkaline phosphatase (APISO): 63 U/L (ref 40–115)
BUN/Creatinine Ratio: 14 (calc) (ref 6–22)
BUN: 22 mg/dL (ref 7–25)
CALCIUM: 9.3 mg/dL (ref 8.6–10.3)
CHLORIDE: 105 mmol/L (ref 98–110)
CO2: 26 mmol/L (ref 20–32)
CREATININE: 1.53 mg/dL — AB (ref 0.70–1.18)
GFR, Est African American: 51 mL/min/{1.73_m2} — ABNORMAL LOW (ref >=60)
GFR, Est Non African American: 44 mL/min/{1.73_m2} — ABNORMAL LOW (ref >=60)
GLOBULIN: 2.9 g/dL (ref 1.9–3.7)
Glucose, Bld: 138 mg/dL — ABNORMAL HIGH (ref 65–99)
POTASSIUM: 4.3 mmol/L (ref 3.5–5.3)
Sodium: 140 mmol/L (ref 135–146)
Total Bilirubin: 0.5 mg/dL (ref 0.2–1.2)
Total Protein: 7.1 g/dL (ref 6.1–8.1)

## 2018-05-13 LAB — HEMOGLOBIN A1C
EAG (MMOL/L): 8.4 (calc)
HEMOGLOBIN A1C: 6.9 %{Hb} — AB (ref <5.7)
Mean Plasma Glucose: 151 (calc)

## 2018-05-19 ENCOUNTER — Encounter: Payer: Self-pay | Admitting: "Endocrinology

## 2018-05-19 ENCOUNTER — Ambulatory Visit (INDEPENDENT_AMBULATORY_CARE_PROVIDER_SITE_OTHER): Payer: Medicare HMO | Admitting: "Endocrinology

## 2018-05-19 VITALS — BP 142/85 | HR 74 | Ht 66.5 in | Wt 254.0 lb

## 2018-05-19 DIAGNOSIS — N182 Chronic kidney disease, stage 2 (mild): Secondary | ICD-10-CM | POA: Diagnosis not present

## 2018-05-19 DIAGNOSIS — E1165 Type 2 diabetes mellitus with hyperglycemia: Secondary | ICD-10-CM

## 2018-05-19 DIAGNOSIS — E1122 Type 2 diabetes mellitus with diabetic chronic kidney disease: Secondary | ICD-10-CM | POA: Diagnosis not present

## 2018-05-19 DIAGNOSIS — IMO0002 Reserved for concepts with insufficient information to code with codable children: Secondary | ICD-10-CM

## 2018-05-19 DIAGNOSIS — E782 Mixed hyperlipidemia: Secondary | ICD-10-CM

## 2018-05-19 DIAGNOSIS — I10 Essential (primary) hypertension: Secondary | ICD-10-CM

## 2018-05-19 MED ORDER — RAMIPRIL 10 MG PO CAPS: 10.0000 mg | ORAL_CAPSULE | Freq: Every day | ORAL | 3 refills | Status: DC

## 2018-05-19 NOTE — Progress Notes (Signed)
Endocrinology follow-up note   Subjective:    Patient ID: Kyle Hicks, male    DOB: 1941/05/15, PCP Sharilyn Sites, MD   Past Medical History:  Diagnosis Date  . Diabetes mellitus without complication ([place])   . HTN (hypertension)    Past Surgical History:  Procedure Laterality Date  . ESOPHAGOGASTRODUODENOSCOPY N/A 04/27/2017   Procedure: ESOPHAGOGASTRODUODENOSCOPY (EGD);  Surgeon: Rogene Houston, MD;  Location: [facility];  Service: Endoscopy;  Laterality: N/A;   Social History   Socioeconomic History  . Marital status: Married    Spouse name: Not on file  . Number of children: Not on file  . Years of education: Not on file  . Highest education level: Not on file  Occupational History  . Not on file  Social Needs  . Financial resource strain: Not on file  . Food insecurity:    Worry: Not on file    Inability: Not on file  . Transportation needs:    Medical: Not on file    Non-medical: Not on file  Tobacco Use  . Smoking status: Former Smoker    Packs/day: 0.50    Types: Cigarettes    Last attempt to quit: 07/26/1987    Years since quitting: 30.8  . Smokeless tobacco: Never Used  Substance and Sexual Activity  . Alcohol use: No    Alcohol/week: 0.0 standard drinks    Comment: Previously drank socially, last drank 25-30 years ago  . Drug use: No  . Sexual activity: Not on file  Lifestyle  . Physical activity:    Days per week: Not on file    Minutes per session: Not on file  . Stress: Not on file  Relationships  . Social connections:    Talks on phone: Not on file    Gets together: Not on file    Attends religious service: Not on file    Active member of club or organization: Not on file    Attends meetings of clubs or organizations: Not on file    Relationship status: Not on file  Other Topics Concern  . Not on file  Social History Narrative  . Not on file   Outpatient Encounter Medications as of 05/19/2018  Medication Sig  . atorvastatin  (LIPITOR) 20 MG tablet Take 20 mg by mouth daily.  . blood glucose meter kit and supplies KIT Dispense based on patient and insurance preference. Use up to 2 times daily as directed. (FOR ICD-10 E11.65)  . diclofenac sodium (VOLTAREN) 1 % GEL Apply 2 grams twice daily to any areas of pain  . glipiZIDE (GLUCOTROL) 5 MG tablet Take 1 tablet (5 mg total) by mouth 2 (two) times daily before a meal.  . glucose blood (ACCU-CHEK GUIDE) test strip Use as instructed bid E11.65  . hydrochlorothiazide (HYDRODIURIL) 25 MG tablet Take 1 tablet (25 mg total) by mouth daily.  Marland Kitchen JANUMET 50-1000 MG tablet TAKE 1/2 TABLET BY MOUTH TWICE DAILY  . Lancets (ACCU-CHEK MULTICLIX) lancets Use as instructed bid. E11.65  . lidocaine (LIDODERM) 5 % Place 1 patch onto the skin daily. Remove & Discard patch within 12 hours.  Apply to area of pain.  Marland Kitchen NIFEdipine (PROCARDIA-XL/ADALAT-CC/NIFEDICAL-XL) 30 MG 24 hr tablet take 1 tablet by mouth once daily  . pantoprazole (PROTONIX) 40 MG tablet Take 1 tablet (40 mg total) by mouth 2 (two) times daily.  . ramipril (ALTACE) 10 MG capsule TAKE 1 CAPSULE(10 MG) BY MOUTH DAILY  . sucralfate (CARAFATE) 1  GM/10ML suspension Take 10 mLs (1 g total) by mouth 4 (four) times daily -  with meals and at bedtime.  . [DISCONTINUED] Blood Glucose Monitoring Suppl (ACCU-CHEK GUIDE) w/Device KIT 1 Piece by Does not apply route 2 (two) times daily.   No facility-administered encounter medications on file as of 05/19/2018.    ALLERGIES: No Known Allergies VACCINATION STATUS:  There is no immunization history on file for this patient.  Diabetes  He presents for his follow-up diabetic visit. He has type 2 diabetes mellitus. The initial diagnosis of diabetes was made 2 years ago. His disease course has been improving. There are no hypoglycemic associated symptoms. Pertinent negatives for hypoglycemia include no confusion, headaches, pallor or seizures. There are no diabetic associated symptoms.  Pertinent negatives for diabetes include no chest pain, no fatigue, no polydipsia, no polyphagia, no polyuria and no weakness. There are no hypoglycemic complications. Symptoms are improving. Diabetic complications include nephropathy. Risk factors for coronary artery disease include diabetes mellitus, dyslipidemia, hypertension, male sex, obesity, sedentary lifestyle and tobacco exposure. He is compliant with treatment most of the time. His weight is stable. He is following a generally unhealthy diet. He has had a previous visit with a dietitian. He never participates in exercise. (He did not bring any meter nor logs to review today.  His A1c has improved to 6.9% from 8.8%.) An ACE inhibitor/angiotensin II receptor blocker is being taken.  Hypertension  This is a chronic problem. The current episode started more than 1 year ago. The problem is uncontrolled. Pertinent negatives include no chest pain, headaches, neck pain, palpitations or shortness of breath. Risk factors for coronary artery disease include diabetes mellitus, dyslipidemia, obesity, sedentary lifestyle and smoking/tobacco exposure. Past treatments include angiotensin blockers.  Hyperlipidemia  This is a chronic problem. The current episode started more than 1 year ago. Exacerbating diseases include diabetes and obesity. Pertinent negatives include no chest pain, myalgias or shortness of breath. Current antihyperlipidemic treatment includes statins. Risk factors for coronary artery disease include diabetes mellitus, dyslipidemia, hypertension, family history, male sex, obesity and a sedentary lifestyle.    Review of Systems  Constitutional: Negative for fatigue and unexpected weight change.  HENT: Negative for dental problem, mouth sores and trouble swallowing.   Eyes: Negative for visual disturbance.  Respiratory: Negative for cough, choking, chest tightness, shortness of breath and wheezing.   Cardiovascular: Negative for chest pain,  palpitations and leg swelling.  Gastrointestinal: Negative for abdominal distention, abdominal pain, constipation, diarrhea, nausea and vomiting.  Endocrine: Negative for polydipsia, polyphagia and polyuria.  Genitourinary: Negative for dysuria, flank pain, hematuria and urgency.  Musculoskeletal: Negative for back pain, gait problem, myalgias and neck pain.  Skin: Negative for pallor, rash and wound.  Neurological: Negative for seizures, syncope, weakness, numbness and headaches.  Psychiatric/Behavioral: Negative.  Negative for confusion and dysphoric mood.    Objective:    BP (!) 142/85   Pulse 74   Ht 5' 6.5" (1.689 m)   Wt 254 lb (115.2 kg)   BMI 40.38 kg/m   Wt Readings from Last 3 Encounters:  05/19/18 254 lb (115.2 kg)  02/13/18 255 lb (115.7 kg)  02/03/18 256 lb (116.1 kg)    Physical Exam  Constitutional: He is oriented to person, place, and time. He appears well-developed. He is cooperative. No distress.  HENT:  Head: Normocephalic and atraumatic.  Eyes: EOM are normal.  Neck: Normal range of motion. Neck supple. No tracheal deviation present. No thyromegaly present.  Cardiovascular: Normal rate,  S1 normal and S2 normal. Exam reveals no gallop.  No murmur heard. Pulses:      Dorsalis pedis pulses are 1+ on the right side, and 1+ on the left side.       Posterior tibial pulses are 1+ on the right side, and 1+ on the left side.  Pulmonary/Chest: Effort normal. No respiratory distress. He has no wheezes.  Abdominal: He exhibits no distension. There is no tenderness. There is no guarding and no CVA tenderness.  Musculoskeletal: He exhibits no edema.       Right shoulder: He exhibits no swelling and no deformity.  Neurological: He is alert and oriented to person, place, and time. He has normal strength. No cranial nerve deficit or sensory deficit. Gait normal.  Skin: Skin is warm and dry. No rash noted. No cyanosis. Nails show no clubbing.  Psychiatric: He has a normal  mood and affect. His speech is normal. Judgment normal. Cognition and memory are normal.    Results for orders placed or performed in visit on 02/13/18  COMPLETE METABOLIC PANEL WITH GFR  Result Value Ref Range   Glucose, Bld 138 (H) 65 - 99 mg/dL   BUN 22 7 - 25 mg/dL   Creat 1.53 (H) 0.70 - 1.18 mg/dL   GFR, Est Non African American 44 (L) > OR = 60 mL/min/1.11m   GFR, Est African American 51 (L) > OR = 60 mL/min/1.769m  BUN/Creatinine Ratio 14 6 - 22 (calc)   Sodium 140 135 - 146 mmol/L   Potassium 4.3 3.5 - 5.3 mmol/L   Chloride 105 98 - 110 mmol/L   CO2 26 20 - 32 mmol/L   Calcium 9.3 8.6 - 10.3 mg/dL   Total Protein 7.1 6.1 - 8.1 g/dL   Albumin 4.2 3.6 - 5.1 g/dL   Globulin 2.9 1.9 - 3.7 g/dL (calc)   AG Ratio 1.4 1.0 - 2.5 (calc)   Total Bilirubin 0.5 0.2 - 1.2 mg/dL   Alkaline phosphatase (APISO) 63 40 - 115 U/L   AST 12 10 - 35 U/L   ALT 14 9 - 46 U/L  Hemoglobin A1c  Result Value Ref Range   Hgb A1c MFr Bld 6.9 (H) <5.7 % of total Hgb   Mean Plasma Glucose 151 (calc)   eAG (mmol/L) 8.4 (calc)   Diabetic Labs (most recent): Lab Results  Component Value Date   HGBA1C 6.9 (H) 05/12/2018   HGBA1C 8.8 (H) 02/06/2018   HGBA1C 6.9 (H) 09/19/2017   Lipid Panel     Component Value Date/Time   CHOL 143 06/20/2017 0727   TRIG 75 06/20/2017 0727   HDL 47 06/20/2017 0727   CHOLHDL 3.0 06/20/2017 0727   VLDL 14 01/19/2016 0719   LDLCALC 80 06/20/2017 0727     Assessment & Plan:   1. 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 These are all discussed in detail with the patient.  -He came with improved A1c of 6.9% from 8.8%.   -His  recent labs reviewed.  - I have re-counseled the patient on diet management and weight loss  by adopting a carbohydrate restricted / protein rich  Diet.  -  Suggestion is made for him  to avoid simple carbohydrates  from his diet including Cakes, Sweet Desserts / Pastries, Ice Cream, Soda (diet and regular), Sweet Tea, Candies,  Chips, Cookies, Store Bought Juices, Alcohol in Excess of  1-2 drinks a day, Artificial Sweeteners, and "Sugar-free" Products. This will help patient to have stable blood glucose profile and potentially avoid unintended weight gain.  - Patient is advised to stick to a routine mealtimes to eat 3 meals  a day and avoid unnecessary snacks ( to snack only to correct hypoglycemia).  - I have approached patient with the following individualized plan to manage diabetes and patient agrees.  -He has responded to the addition of glipizide to his Janumet.   -Emphasizing the need for monitoring blood glucose at least twice a day-daily before breakfast and at bedtime, he is advised to continue glipizide 5 mg p.o. twice daily with breakfast and supper and continue Janumet 25/500 mg p.o. twice daily.    - He promises to do better on his diet and exercise. -He will not tolerate maximum dose of metformin due to CKD. - Patient specific target  for A1c; LDL, HDL, Triglycerides, and  Waist Circumference were discussed in detail.  2) BP/HTN: His blood pressure is not controlled to target.   He is advised to continue his current blood pressure medications including hydrochlorothiazide 25 mg p.o. daily, Procardia 30 mg p.o. daily, ramipril 10 mg p.o. Daily.     3) Lipids/HPL: His recent lipid panel showed controlled LDL at 80.  He is advised to continue atorvastatin 20 mg p.o. nightly.   4)  Weight/Diet: CDE consult in progress, exercise, and carbohydrates information provided.  5) Chronic Care/Health Maintenance:  -Patient is on ACEI/ARB and Statin medications and encouraged to continue to follow up with Ophthalmology, Podiatrist at least yearly or according to recommendations, and advised to stay away from smoking. I have recommended yearly flu vaccine and pneumonia  vaccination at least every 5 years; moderate intensity exercise for up to 150 minutes weekly; and  sleep for at least 7 hours a day.  - I advised patient to maintain close follow up with Sharilyn Sites, MD for primary care needs.  - Time spent with the patient: 25 min, of which >50% was spent in reviewing his blood glucose logs , discussing his hypo- and hyper-glycemic episodes, reviewing his current and  previous labs and insulin doses and developing a plan to avoid hypo- and hyper-glycemia. Please refer to Patient Instructions for Blood Glucose Monitoring and Insulin/Medications Dosing Guide"  in media tab for additional information. Evalee Jefferson participated in the discussions, expressed understanding, and voiced agreement with the above plans.  All questions were answered to his satisfaction. he is encouraged to contact clinic should he have any questions or concerns prior to his return visit.  Follow up plan: -Return in about 4 months (around 09/19/2018) for Follow up with Pre-visit Labs, Meter, and Logs.  Glade Lloyd, MD Phone: [phone]  Fax: [phone]  -  This note was partially dictated with voice recognition software. Similar sounding words can be transcribed inadequately or may not  be corrected upon review.  05/19/2018, 2:42 PM

## 2018-05-19 NOTE — Patient Instructions (Signed)

## 2018-06-13 ENCOUNTER — Other Ambulatory Visit: Payer: Self-pay | Admitting: "Endocrinology

## 2018-06-16 ENCOUNTER — Other Ambulatory Visit: Payer: Self-pay | Admitting: "Endocrinology

## 2018-07-12 DIAGNOSIS — E782 Mixed hyperlipidemia: Secondary | ICD-10-CM | POA: Diagnosis not present

## 2018-07-12 DIAGNOSIS — Z6841 Body Mass Index (BMI) 40.0 and over, adult: Secondary | ICD-10-CM | POA: Diagnosis not present

## 2018-07-12 DIAGNOSIS — E781 Pure hyperglyceridemia: Secondary | ICD-10-CM | POA: Diagnosis not present

## 2018-07-12 DIAGNOSIS — Z0001 Encounter for general adult medical examination with abnormal findings: Secondary | ICD-10-CM | POA: Diagnosis not present

## 2018-07-12 DIAGNOSIS — E119 Type 2 diabetes mellitus without complications: Secondary | ICD-10-CM | POA: Diagnosis not present

## 2018-07-12 DIAGNOSIS — L821 Other seborrheic keratosis: Secondary | ICD-10-CM | POA: Diagnosis not present

## 2018-07-12 DIAGNOSIS — Z1389 Encounter for screening for other disorder: Secondary | ICD-10-CM | POA: Diagnosis not present

## 2018-07-12 DIAGNOSIS — E785 Hyperlipidemia, unspecified: Secondary | ICD-10-CM | POA: Diagnosis not present

## 2018-07-12 DIAGNOSIS — I10 Essential (primary) hypertension: Secondary | ICD-10-CM | POA: Diagnosis not present

## 2018-08-15 DIAGNOSIS — R69 Illness, unspecified: Secondary | ICD-10-CM | POA: Diagnosis not present

## 2018-08-16 IMAGING — CT CT ABD-PELV W/ CM
2 of 5 series · 16 of 46 positions shown, 18 images · IV contrast (Isovue)
Comparison: None.

CLINICAL DATA: Bloody stools.  Concern for cirrhosis.

EXAM:
CT ABDOMEN AND PELVIS WITH CONTRAST
TECHNIQUE: Multidetector CT imaging of the abdomen and pelvis was performed
using the standard protocol following bolus administration of
intravenous contrast.
CONTRAST:  100mL [id number]-OTT IOPAMIDOL (H1Y71D-OTT) INJECTION 61%

[Series 2: axial st · axial · 0.98mm/px · z∈[-460,-75]mm · 13 of 87 slices shown, 15 images]
[im 5/87  soft-tissue]
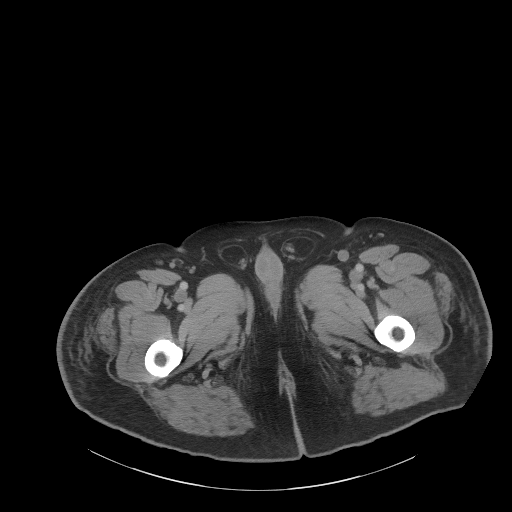
[im 5/87  bone]
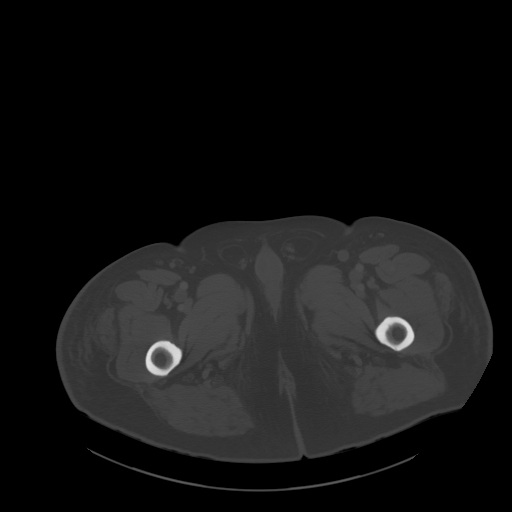
[im 10/87  soft-tissue]
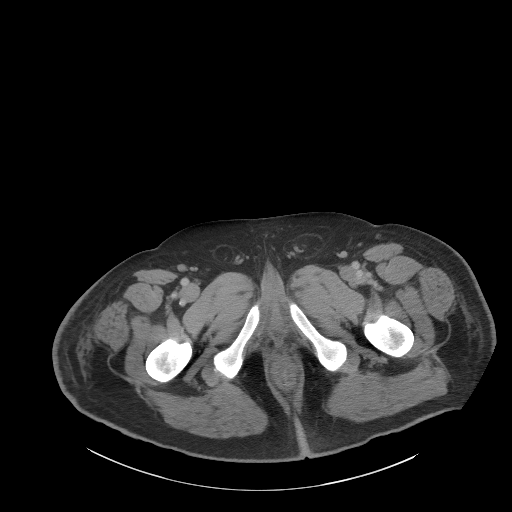
[im 20/87  soft-tissue]
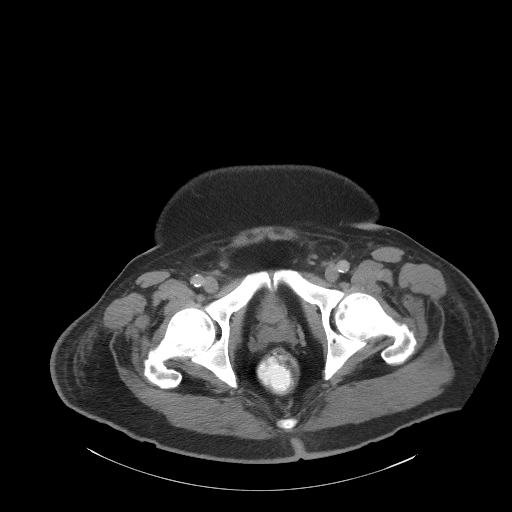
[im 24/87  soft-tissue]
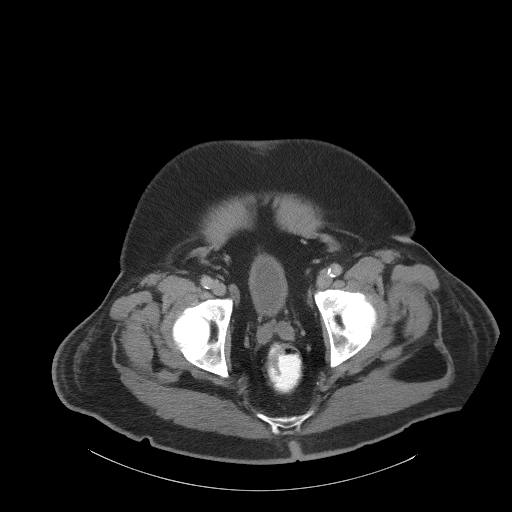
[im 29/87  soft-tissue]
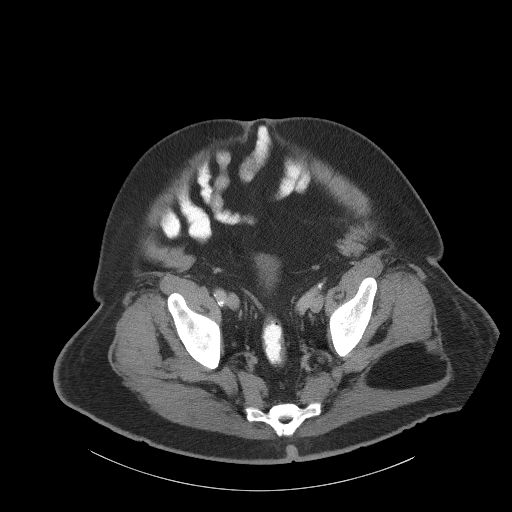
[im 39/87  soft-tissue]
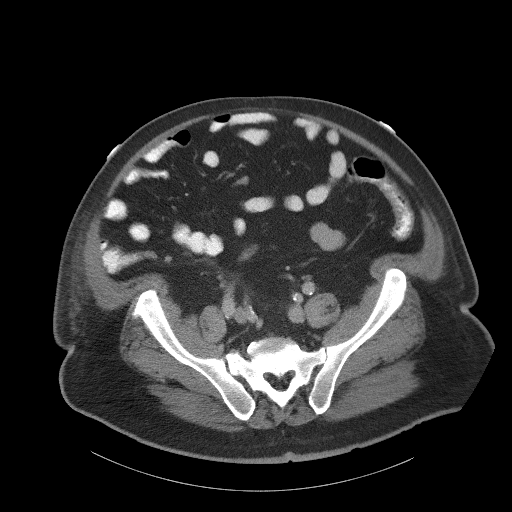
[im 44/87  soft-tissue]
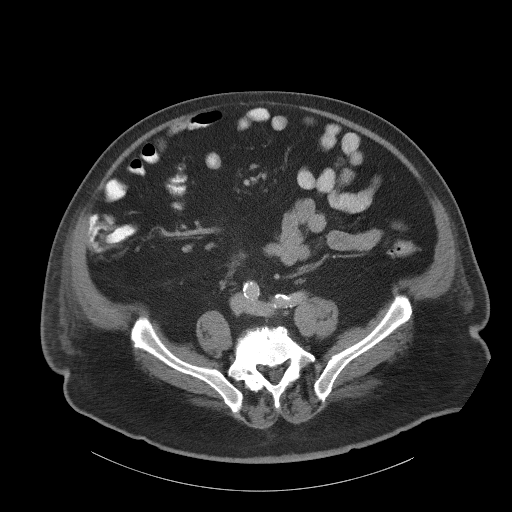
[im 48/87  soft-tissue]
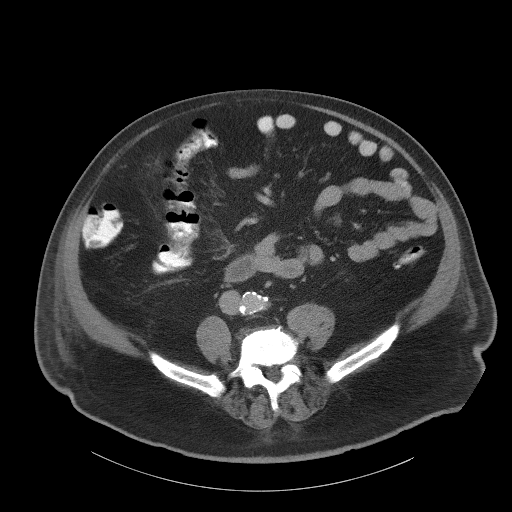
[im 58/87  soft-tissue]
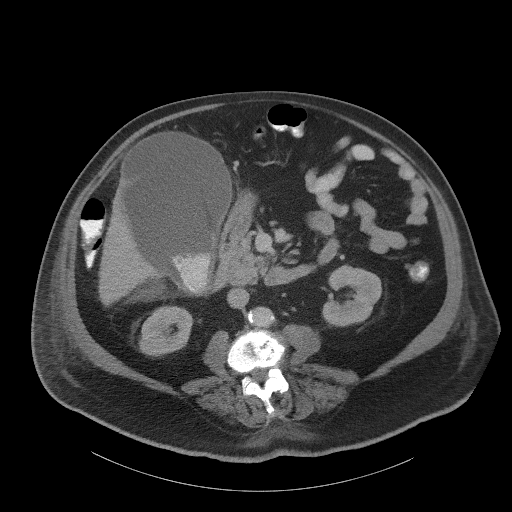
[im 58/87  bone]
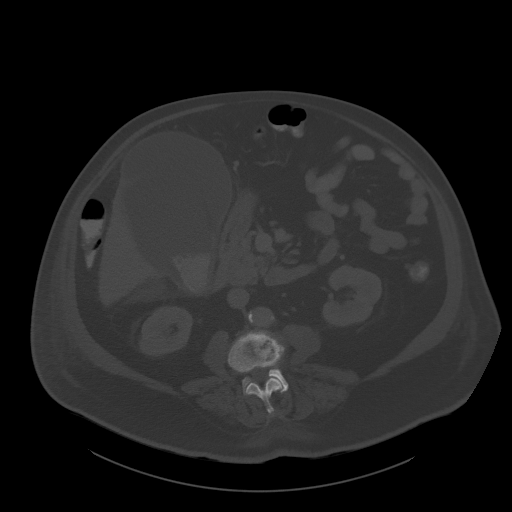
[im 63/87  soft-tissue]
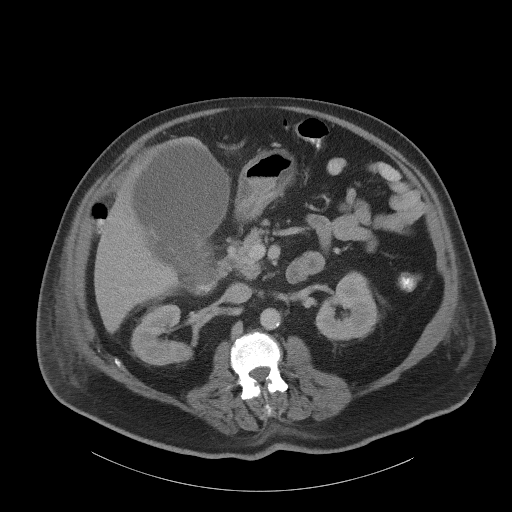
[im 67/87  soft-tissue]
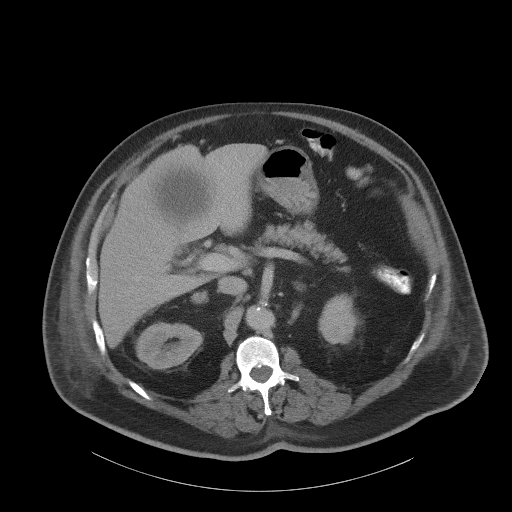
[im 77/87  soft-tissue]
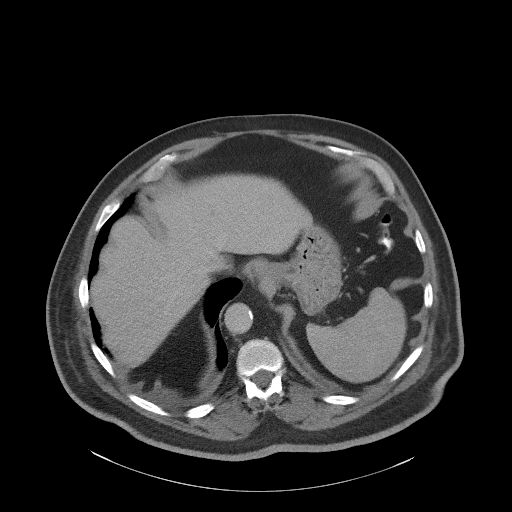
[im 82/87  soft-tissue]
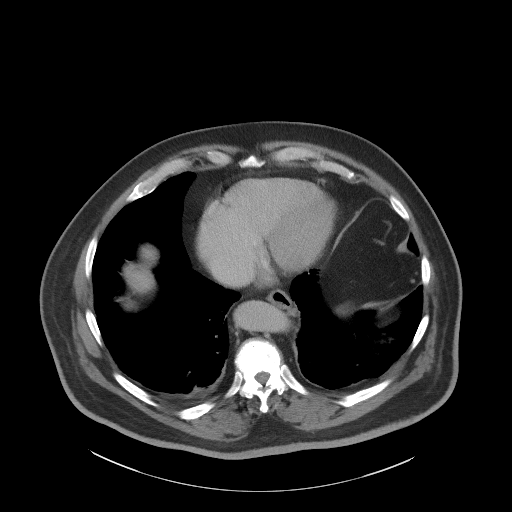

[Series 5: coronal st · coronal · 0.85mm/px · 3 of 119 slices shown]
[im 40/119  soft-tissue]
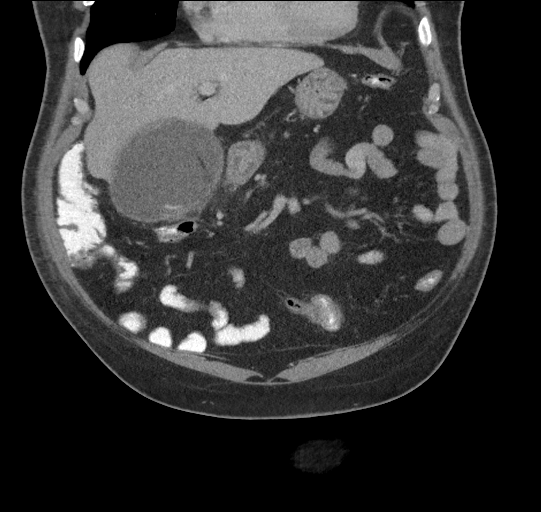
[im 53/119  soft-tissue]
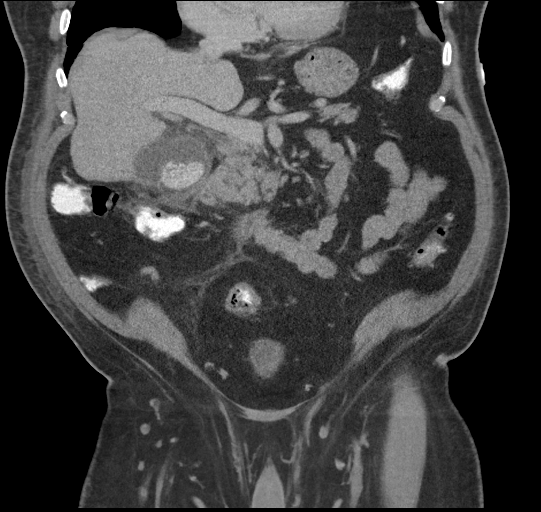
[im 66/119  soft-tissue]
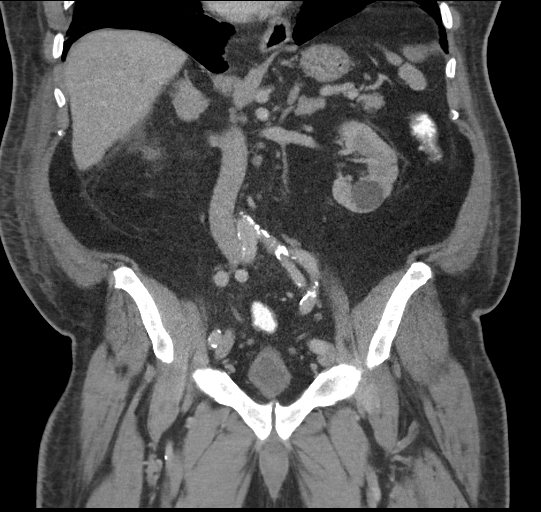

[16 of 46 positions shown; findings below may reference images not displayed]

FINDINGS: Lower chest: No acute abnormality.  Bibasilar atelectasis.

Hepatobiliary: No focal liver abnormality. Questionable nodular
liver surface. The gallbladder is massively distended, with multiple
gallstones in the gallbladder neck. There is surrounding
inflammatory stranding around the gallbladder neck. Portions of the
gallbladder wall measured up to 6 mm in thickness. No biliary
dilatation.

Pancreas: Unremarkable. No pancreatic ductal dilatation or
surrounding inflammatory changes.

Spleen: Normal in size without focal abnormality.

Adrenals/Urinary Tract: There is a 1.5 cm nodule in the right
adrenal gland. The left adrenal gland is unremarkable. There is a
2.9 cm simple cyst in the lower pole of the left kidney. No renal
calculi or hydronephrosis. The bladder is decompressed.

Stomach/Bowel: Small hiatal hernia. Stomach is otherwise within
normal limits. Appendix appears normal. No evidence of bowel wall
thickening, distention, or inflammatory changes. Scattered
left-sided diverticulosis.

Vascular/Lymphatic: Aortic atherosclerosis. No enlarged abdominal or
pelvic lymph nodes.

Reproductive: Prostate is unremarkable.

Other: No free fluid or pneumoperitoneum. Small umbilical hernia
containing fat and a single loop of small bowel. Bilateral fat
containing inguinal hernias.

Musculoskeletal: No acute or significant osseous findings. Moderate
to severe degenerative changes of the lumbar spine. There is a 4.3 x
7.8 x 5.5 cm (AP by transverse by CC) lipoma in the left gluteus
maximus muscle.
IMPRESSION: 1. Cholelithiasis with markedly distended gallbladder, inflammatory
changes surrounding the gallbladder neck, and asymmetric gallbladder
wall thickening. Findings are concerning for acute cholecystitis.
2. Questionable liver surface nodularity, which could reflect
underlying cirrhosis. No significant caudate lobe hypertrophy.
Consider ultrasound for further evaluation.
3. Indeterminate 1.5 cm right adrenal nodule. This is probably a
benign adenoma in the absence of history of malignancy. Consider 12
month follow-up adrenal protocol CT. This recommendation follows ACR
consensus guidelines: Management of Incidental Adrenal Masses: A
White Paper of the ACR Incidental Findings Committee. [HOSPITAL] [id number].
4.  Aortic atherosclerosis (0AHP8-P2P.P).

## 2018-08-18 ENCOUNTER — Other Ambulatory Visit: Payer: Self-pay | Admitting: "Endocrinology

## 2018-08-28 ENCOUNTER — Other Ambulatory Visit: Payer: Self-pay | Admitting: "Endocrinology

## 2018-09-12 DIAGNOSIS — E1122 Type 2 diabetes mellitus with diabetic chronic kidney disease: Secondary | ICD-10-CM | POA: Diagnosis not present

## 2018-09-12 DIAGNOSIS — E1165 Type 2 diabetes mellitus with hyperglycemia: Secondary | ICD-10-CM | POA: Diagnosis not present

## 2018-09-12 DIAGNOSIS — N182 Chronic kidney disease, stage 2 (mild): Secondary | ICD-10-CM | POA: Diagnosis not present

## 2018-09-13 LAB — COMPLETE METABOLIC PANEL WITH GFR
AG RATIO: 1.3 (calc) (ref 1.0–2.5)
ALBUMIN MSPROF: 3.9 g/dL (ref 3.6–5.1)
ALT: 16 U/L (ref 9–46)
AST: 14 U/L (ref 10–35)
Alkaline phosphatase (APISO): 66 U/L (ref 35–144)
BUN / CREAT RATIO: 13 (calc) (ref 6–22)
BUN: 16 mg/dL (ref 7–25)
CALCIUM: 8.7 mg/dL (ref 8.6–10.3)
CO2: 24 mmol/L (ref 20–32)
CREATININE: 1.27 mg/dL — AB (ref 0.70–1.18)
Chloride: 107 mmol/L (ref 98–110)
GFR, EST AFRICAN AMERICAN: 64 mL/min/{1.73_m2} (ref >=60)
GFR, EST NON AFRICAN AMERICAN: 55 mL/min/{1.73_m2} — AB (ref >=60)
GLOBULIN: 3.1 g/dL (ref 1.9–3.7)
Glucose, Bld: 135 mg/dL — ABNORMAL HIGH (ref 65–99)
POTASSIUM: 3.7 mmol/L (ref 3.5–5.3)
SODIUM: 140 mmol/L (ref 135–146)
Total Bilirubin: 0.3 mg/dL (ref 0.2–1.2)
Total Protein: 7 g/dL (ref 6.1–8.1)

## 2018-09-13 LAB — HEMOGLOBIN A1C
EAG (MMOL/L): 7.7 (calc)
Hgb A1c MFr Bld: 6.5 % of total Hgb — ABNORMAL HIGH (ref <5.7)
MEAN PLASMA GLUCOSE: 140 (calc)

## 2018-09-19 ENCOUNTER — Ambulatory Visit: Payer: Medicare HMO | Admitting: "Endocrinology

## 2018-09-19 ENCOUNTER — Encounter: Payer: Self-pay | Admitting: "Endocrinology

## 2018-09-19 VITALS — BP 148/90 | HR 93 | Ht 66.5 in | Wt 262.0 lb

## 2018-09-19 DIAGNOSIS — N182 Chronic kidney disease, stage 2 (mild): Secondary | ICD-10-CM

## 2018-09-19 DIAGNOSIS — E1122 Type 2 diabetes mellitus with diabetic chronic kidney disease: Secondary | ICD-10-CM | POA: Diagnosis not present

## 2018-09-19 DIAGNOSIS — E1165 Type 2 diabetes mellitus with hyperglycemia: Secondary | ICD-10-CM

## 2018-09-19 DIAGNOSIS — E782 Mixed hyperlipidemia: Secondary | ICD-10-CM | POA: Diagnosis not present

## 2018-09-19 DIAGNOSIS — IMO0002 Reserved for concepts with insufficient information to code with codable children: Secondary | ICD-10-CM

## 2018-09-19 DIAGNOSIS — I10 Essential (primary) hypertension: Secondary | ICD-10-CM | POA: Diagnosis not present

## 2018-09-19 MED ORDER — RAMIPRIL 10 MG PO CAPS: 10.0000 mg | ORAL_CAPSULE | Freq: Every day | ORAL | 3 refills | Status: DC

## 2018-09-19 NOTE — Progress Notes (Signed)
Endocrinology follow-up note   Subjective:    Patient ID: Kyle Hicks, male    DOB: Nov 02, 1940, PCP Sharilyn Sites, MD   Past Medical History:  Diagnosis Date  . Diabetes mellitus without complication ([place])   . HTN (hypertension)    Past Surgical History:  Procedure Laterality Date  . ESOPHAGOGASTRODUODENOSCOPY N/A 04/27/2017   Procedure: ESOPHAGOGASTRODUODENOSCOPY (EGD);  Surgeon: Rogene Houston, MD;  Location: [facility];  Service: Endoscopy;  Laterality: N/A;   Social History   Socioeconomic History  . Marital status: Married    Spouse name: Not on file  . Number of children: Not on file  . Years of education: Not on file  . Highest education level: Not on file  Occupational History  . Not on file  Social Needs  . Financial resource strain: Not on file  . Food insecurity:    Worry: Not on file    Inability: Not on file  . Transportation needs:    Medical: Not on file    Non-medical: Not on file  Tobacco Use  . Smoking status: Former Smoker    Packs/day: 0.50    Types: Cigarettes    Last attempt to quit: 07/26/1987    Years since quitting: 31.1  . Smokeless tobacco: Never Used  Substance and Sexual Activity  . Alcohol use: No    Alcohol/week: 0.0 standard drinks    Comment: Previously drank socially, last drank 25-30 years ago  . Drug use: No  . Sexual activity: Not on file  Lifestyle  . Physical activity:    Days per week: Not on file    Minutes per session: Not on file  . Stress: Not on file  Relationships  . Social connections:    Talks on phone: Not on file    Gets together: Not on file    Attends religious service: Not on file    Active member of club or organization: Not on file    Attends meetings of clubs or organizations: Not on file    Relationship status: Not on file  Other Topics Concern  . Not on file  Social History Narrative  . Not on file   Outpatient Encounter Medications as of 09/19/2018  Medication Sig  . atorvastatin  (LIPITOR) 20 MG tablet Take 20 mg by mouth daily.  . blood glucose meter kit and supplies KIT Dispense based on patient and insurance preference. Use up to 2 times daily as directed. (FOR ICD-10 E11.65)  . diclofenac sodium (VOLTAREN) 1 % GEL Apply 2 grams twice daily to any areas of pain  . glipiZIDE (GLUCOTROL) 5 MG tablet TAKE 1 TABLET(5 MG) BY MOUTH TWICE DAILY BEFORE A MEAL  . glucose blood (ACCU-CHEK GUIDE) test strip Use as instructed bid E11.65  . hydrochlorothiazide (HYDRODIURIL) 25 MG tablet TAKE 1 TABLET(25 MG) BY MOUTH DAILY  . JANUMET 50-1000 MG tablet TAKE 1/2 TABLET BY MOUTH TWICE DAILY  . Lancets (ACCU-CHEK MULTICLIX) lancets Use as instructed bid. E11.65  . lidocaine (LIDODERM) 5 % Place 1 patch onto the skin daily. Remove & Discard patch within 12 hours.  Apply to area of pain.  Marland Kitchen NIFEdipine (PROCARDIA-XL/ADALAT-CC/NIFEDICAL-XL) 30 MG 24 hr tablet take 1 tablet by mouth once daily  . pantoprazole (PROTONIX) 40 MG tablet Take 1 tablet (40 mg total) by mouth 2 (two) times daily.  . ramipril (ALTACE) 10 MG capsule Take 1 capsule (10 mg total) by mouth daily.  . sucralfate (CARAFATE) 1 GM/10ML suspension Take 10  mLs (1 g total) by mouth 4 (four) times daily -  with meals and at bedtime.  . [DISCONTINUED] ramipril (ALTACE) 10 MG capsule TAKE 1 CAPSULE(10 MG) BY MOUTH DAILY   No facility-administered encounter medications on file as of 09/19/2018.    ALLERGIES: No Known Allergies VACCINATION STATUS:  There is no immunization history on file for this patient.  Diabetes  He presents for his follow-up diabetic visit. He has type 2 diabetes mellitus. The initial diagnosis of diabetes was made 2 years ago. His disease course has been stable. There are no hypoglycemic associated symptoms. Pertinent negatives for hypoglycemia include no confusion, headaches, pallor or seizures. There are no diabetic associated symptoms. Pertinent negatives for diabetes include no chest pain, no fatigue,  no polydipsia, no polyphagia, no polyuria and no weakness. There are no hypoglycemic complications. Symptoms are stable. Diabetic complications include nephropathy. Risk factors for coronary artery disease include diabetes mellitus, dyslipidemia, hypertension, male sex, obesity, sedentary lifestyle and tobacco exposure. He is compliant with treatment most of the time. His weight is increasing steadily. He is following a generally unhealthy diet. He has had a previous visit with a dietitian. He never participates in exercise. (He did not bring any meter nor logs.  His previsit labs show A1c of 6.5%, overall improving from 8.8%.  ) An ACE inhibitor/angiotensin II receptor blocker is being taken.  Hypertension  This is a chronic problem. The current episode started more than 1 year ago. The problem is uncontrolled. Pertinent negatives include no chest pain, headaches, neck pain, palpitations or shortness of breath. Risk factors for coronary artery disease include diabetes mellitus, dyslipidemia, obesity, sedentary lifestyle and smoking/tobacco exposure. Past treatments include angiotensin blockers.  Hyperlipidemia  This is a chronic problem. The current episode started more than 1 year ago. Exacerbating diseases include diabetes and obesity. Pertinent negatives include no chest pain, myalgias or shortness of breath. Current antihyperlipidemic treatment includes statins. Risk factors for coronary artery disease include diabetes mellitus, dyslipidemia, hypertension, family history, male sex, obesity and a sedentary lifestyle.    Review of Systems  Constitutional: Negative for fatigue and unexpected weight change.  HENT: Negative for dental problem, mouth sores and trouble swallowing.   Eyes: Negative for visual disturbance.  Respiratory: Negative for cough, choking, chest tightness, shortness of breath and wheezing.   Cardiovascular: Negative for chest pain, palpitations and leg swelling.  Gastrointestinal:  Negative for abdominal distention, abdominal pain, constipation, diarrhea, nausea and vomiting.  Endocrine: Negative for polydipsia, polyphagia and polyuria.  Genitourinary: Negative for dysuria, flank pain, hematuria and urgency.  Musculoskeletal: Negative for back pain, gait problem, myalgias and neck pain.  Skin: Negative for pallor, rash and wound.  Neurological: Negative for seizures, syncope, weakness, numbness and headaches.  Psychiatric/Behavioral: Negative.  Negative for confusion and dysphoric mood.    Objective:    BP (!) 148/90   Pulse 93   Ht 5' 6.5" (1.689 m)   Wt 262 lb (118.8 kg)   BMI 41.65 kg/m   Wt Readings from Last 3 Encounters:  09/19/18 262 lb (118.8 kg)  05/19/18 254 lb (115.2 kg)  02/13/18 255 lb (115.7 kg)    Physical Exam  Constitutional: He is oriented to person, place, and time. He appears well-developed. He is cooperative. No distress.  HENT:  Head: Normocephalic and atraumatic.  Eyes: EOM are normal.  Neck: Normal range of motion. Neck supple. No tracheal deviation present. No thyromegaly present.  Cardiovascular: Normal rate, S1 normal and S2 normal. Exam reveals no  gallop.  No murmur heard. Pulses:      Dorsalis pedis pulses are 1+ on the right side and 1+ on the left side.       Posterior tibial pulses are 1+ on the right side and 1+ on the left side.  Pulmonary/Chest: Effort normal. No respiratory distress. He has no wheezes.  Abdominal: He exhibits no distension. There is no abdominal tenderness. There is no guarding and no CVA tenderness.  Musculoskeletal:        General: No edema.     Right shoulder: He exhibits no swelling and no deformity.  Neurological: He is alert and oriented to person, place, and time. He has normal strength. No cranial nerve deficit or sensory deficit. Gait normal.  Skin: Skin is warm and dry. No rash noted. No cyanosis. Nails show no clubbing.  Psychiatric: He has a normal mood and affect. His speech is normal.  Judgment normal. Cognition and memory are normal.    Results for orders placed or performed in visit on 05/19/18  Hemoglobin A1c  Result Value Ref Range   Hgb A1c MFr Bld 6.5 (H) <5.7 % of total Hgb   Mean Plasma Glucose 140 (calc)   eAG (mmol/L) 7.7 (calc)  COMPLETE METABOLIC PANEL WITH GFR  Result Value Ref Range   Glucose, Bld 135 (H) 65 - 99 mg/dL   BUN 16 7 - 25 mg/dL   Creat 1.27 (H) 0.70 - 1.18 mg/dL   GFR, Est Non African American 55 (L) > OR = 60 mL/min/1.28m   GFR, Est African American 64 > OR = 60 mL/min/1.761m  BUN/Creatinine Ratio 13 6 - 22 (calc)   Sodium 140 135 - 146 mmol/L   Potassium 3.7 3.5 - 5.3 mmol/L   Chloride 107 98 - 110 mmol/L   CO2 24 20 - 32 mmol/L   Calcium 8.7 8.6 - 10.3 mg/dL   Total Protein 7.0 6.1 - 8.1 g/dL   Albumin 3.9 3.6 - 5.1 g/dL   Globulin 3.1 1.9 - 3.7 g/dL (calc)   AG Ratio 1.3 1.0 - 2.5 (calc)   Total Bilirubin 0.3 0.2 - 1.2 mg/dL   Alkaline phosphatase (APISO) 66 35 - 144 U/L   AST 14 10 - 35 U/L   ALT 16 9 - 46 U/L   Diabetic Labs (most recent): Lab Results  Component Value Date   HGBA1C 6.5 (H) 09/12/2018   HGBA1C 6.9 (H) 05/12/2018   HGBA1C 8.8 (H) 02/06/2018   Lipid Panel     Component Value Date/Time   CHOL 143 06/20/2017 0727   TRIG 75 06/20/2017 0727   HDL 47 06/20/2017 0727   CHOLHDL 3.0 06/20/2017 0727   VLDL 14 01/19/2016 0719   LDLCALC 80 06/20/2017 0727     Assessment & Plan:   1. 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 These are all discussed in detail with the patient.  -He came with improved A1c of 6.5%, improving from 8.8%.   -His  recent labs reviewed.  - I have re-counseled the patient on diet management and weight loss  by adopting a carbohydrate restricted / protein rich  Diet.  - Patient admits there is a room for improvement in his diet  and drink choices. -  Suggestion is made for him to avoid simple carbohydrates  from his diet including Cakes, Sweet  Desserts / Pastries, Ice Cream, Soda (diet and regular), Sweet Tea, Candies, Chips, Cookies, Store Bought Juices, Alcohol in Excess of  1-2 drinks a day, Artificial Sweeteners, and "Sugar-free" Products. This will help patient to have stable blood glucose profile and potentially avoid unintended weight gain.   - Patient is advised to stick to a routine mealtimes to eat 3 meals  a day and avoid unnecessary snacks ( to snack only to correct hypoglycemia).  - I have approached patient with the following individualized plan to manage diabetes and patient agrees.  -He has responded to the addition of glipizide to his Janumet.    -Emphasizing the need for monitoring blood glucose at least twice a day- daily before breakfast and at bedtime, he is advised to continue glipizide 5 mg p.o. twice daily with breakfast and with supper. -He is also advised to continue Janumet 25/500 mg p.o. twice daily.    - He promises to do better on his diet and exercise. -He will not tolerate maximum dose of metformin due to CKD. - Patient specific target  for A1c; LDL, HDL, Triglycerides, and  Waist Circumference were discussed in detail.  2) BP/HTN: His blood pressure is not controlled to target.  He has 3 medications for his blood pressure, advised to be consistent with dose and take them in the morning at breakfast.  He is advised to continue   medications including hydrochlorothiazide 25 mg p.o. daily, Procardia 30 mg p.o. daily, ramipril 10 mg p.o. Daily.     3) Lipids/HPL: His recent lipid panel showed controlled LDL at 80.  He is advised to continue atorvastatin 20 mg p.o. nightly.    4)  Weight/Diet: CDE consult in progress, exercise, and carbohydrates information provided.  5) Chronic Care/Health Maintenance:  -Patient is on ACEI/ARB and Statin medications and encouraged to continue to follow  up with Ophthalmology, Podiatrist at least yearly or according to recommendations, and advised to stay away from smoking. I have recommended yearly flu vaccine and pneumonia vaccination at least every 5 years; moderate intensity exercise for up to 150 minutes weekly; and  sleep for at least 7 hours a day.  - I advised patient to maintain close follow up with Sharilyn Sites, MD for primary care needs.  - Time spent with the patient: 25 min, of which >50% was spent in reviewing his  current and  previous labs/studies, previous treatments, and medications doses and developing a plan for long-term care based on the latest recommendations for standards of care. Evalee Jefferson participated in the discussions, expressed understanding, and voiced agreement with the above plans.  All questions were answered to his satisfaction. he is encouraged to contact clinic should he have any questions or concerns prior to his return visit.   Follow up plan: -Return in about 4 months (around 01/18/2019) for Follow up with Pre-visit Labs, Meter, and Logs.  Glade Lloyd, MD Phone: [phone]  Fax: [phone]  -  This note was partially dictated with voice recognition software. Similar sounding words can be transcribed inadequately or may not  be corrected upon review.  09/19/2018, 9:31 AM

## 2018-09-19 NOTE — Patient Instructions (Signed)

## 2018-11-22 ENCOUNTER — Other Ambulatory Visit: Payer: Self-pay | Admitting: "Endocrinology

## 2018-12-04 ENCOUNTER — Other Ambulatory Visit: Payer: Self-pay | Admitting: "Endocrinology

## 2018-12-24 ENCOUNTER — Other Ambulatory Visit: Payer: Self-pay | Admitting: "Endocrinology

## 2018-12-26 DIAGNOSIS — N182 Chronic kidney disease, stage 2 (mild): Secondary | ICD-10-CM | POA: Diagnosis not present

## 2018-12-26 DIAGNOSIS — E1165 Type 2 diabetes mellitus with hyperglycemia: Secondary | ICD-10-CM | POA: Diagnosis not present

## 2018-12-26 DIAGNOSIS — E1122 Type 2 diabetes mellitus with diabetic chronic kidney disease: Secondary | ICD-10-CM | POA: Diagnosis not present

## 2018-12-27 LAB — COMPLETE METABOLIC PANEL WITH GFR
AG Ratio: 1.4 (calc) (ref 1.0–2.5)
ALT: 15 U/L (ref 9–46)
AST: 12 U/L (ref 10–35)
Albumin: 4.1 g/dL (ref 3.6–5.1)
Alkaline phosphatase (APISO): 75 U/L (ref 35–144)
BUN/Creatinine Ratio: 11 (calc) (ref 6–22)
BUN: 16 mg/dL (ref 7–25)
CALCIUM: 9.1 mg/dL (ref 8.6–10.3)
CO2: 25 mmol/L (ref 20–32)
Chloride: 107 mmol/L (ref 98–110)
Creat: 1.46 mg/dL — ABNORMAL HIGH (ref 0.70–1.18)
GFR, Est African American: 53 mL/min/{1.73_m2} — ABNORMAL LOW (ref >=60)
GFR, Est Non African American: 46 mL/min/{1.73_m2} — ABNORMAL LOW (ref >=60)
Globulin: 3 g/dL (calc) (ref 1.9–3.7)
Glucose, Bld: 165 mg/dL — ABNORMAL HIGH (ref 65–99)
Potassium: 4 mmol/L (ref 3.5–5.3)
Sodium: 142 mmol/L (ref 135–146)
Total Bilirubin: 0.5 mg/dL (ref 0.2–1.2)
Total Protein: 7.1 g/dL (ref 6.1–8.1)

## 2018-12-27 LAB — HEMOGLOBIN A1C
Hgb A1c MFr Bld: 8 % of total Hgb — ABNORMAL HIGH (ref <5.7)
Mean Plasma Glucose: 183 (calc)
eAG (mmol/L): 10.1 (calc)

## 2019-01-02 ENCOUNTER — Other Ambulatory Visit: Payer: Self-pay

## 2019-01-02 ENCOUNTER — Encounter: Payer: Self-pay | Admitting: "Endocrinology

## 2019-01-02 ENCOUNTER — Encounter (INDEPENDENT_AMBULATORY_CARE_PROVIDER_SITE_OTHER): Payer: Self-pay

## 2019-01-02 ENCOUNTER — Ambulatory Visit (INDEPENDENT_AMBULATORY_CARE_PROVIDER_SITE_OTHER): Payer: Medicare HMO | Admitting: "Endocrinology

## 2019-01-02 VITALS — BP 150/86 | HR 89 | Ht 66.5 in | Wt 261.0 lb

## 2019-01-02 DIAGNOSIS — E1165 Type 2 diabetes mellitus with hyperglycemia: Secondary | ICD-10-CM

## 2019-01-02 DIAGNOSIS — E1122 Type 2 diabetes mellitus with diabetic chronic kidney disease: Secondary | ICD-10-CM | POA: Diagnosis not present

## 2019-01-02 DIAGNOSIS — N182 Chronic kidney disease, stage 2 (mild): Secondary | ICD-10-CM | POA: Diagnosis not present

## 2019-01-02 DIAGNOSIS — I10 Essential (primary) hypertension: Secondary | ICD-10-CM

## 2019-01-02 DIAGNOSIS — E782 Mixed hyperlipidemia: Secondary | ICD-10-CM | POA: Diagnosis not present

## 2019-01-02 DIAGNOSIS — IMO0002 Reserved for concepts with insufficient information to code with codable children: Secondary | ICD-10-CM

## 2019-01-02 NOTE — Progress Notes (Signed)
Endocrinology follow-up note   Subjective:    Patient ID: Kyle Hicks, male    DOB: [birth year], PCP Sharilyn Sites, MD   Past Medical History:  Diagnosis Date  . Diabetes mellitus without complication ([place])   . HTN (hypertension)    Past Surgical History:  Procedure Laterality Date  . ESOPHAGOGASTRODUODENOSCOPY N/A 04/27/2017   Procedure: ESOPHAGOGASTRODUODENOSCOPY (EGD);  Surgeon: Rogene Houston, MD;  Location: [facility];  Service: Endoscopy;  Laterality: N/A;   Social History   Socioeconomic History  . Marital status: Married    Spouse name: Not on file  . Number of children: Not on file  . Years of education: Not on file  . Highest education level: Not on file  Occupational History  . Not on file  Social Needs  . Financial resource strain: Not on file  . Food insecurity:    Worry: Not on file    Inability: Not on file  . Transportation needs:    Medical: Not on file    Non-medical: Not on file  Tobacco Use  . Smoking status: Former Smoker    Packs/day: 0.50    Types: Cigarettes    Last attempt to quit: 07/26/1987    Years since quitting: 31.4  . Smokeless tobacco: Never Used  Substance and Sexual Activity  . Alcohol use: No    Alcohol/week: 0.0 standard drinks    Comment: Previously drank socially, last drank 25-30 years ago  . Drug use: No  . Sexual activity: Not on file  Lifestyle  . Physical activity:    Days per week: Not on file    Minutes per session: Not on file  . Stress: Not on file  Relationships  . Social connections:    Talks on phone: Not on file    Gets together: Not on file    Attends religious service: Not on file    Active member of club or organization: Not on file    Attends meetings of clubs or organizations: Not on file    Relationship status: Not on file  Other Topics Concern  . Not on file  Social History Narrative  . Not on file   Outpatient Encounter Medications as of 01/02/2019  Medication Sig  . atorvastatin  (LIPITOR) 20 MG tablet Take 20 mg by mouth daily.  . blood glucose meter kit and supplies KIT Dispense based on patient and insurance preference. Use up to 2 times daily as directed. (FOR ICD-10 E11.65)  . diclofenac sodium (VOLTAREN) 1 % GEL Apply 2 grams twice daily to any areas of pain  . glipiZIDE (GLUCOTROL) 5 MG tablet TAKE 1 TABLET BY MOUTH TWICE DAILY BEFORE MEAL(S)  . glucose blood (ACCU-CHEK GUIDE) test strip Use as instructed bid E11.65  . hydrochlorothiazide (HYDRODIURIL) 25 MG tablet TAKE 1 TABLET(25 MG) BY MOUTH DAILY  . JANUMET 50-1000 MG tablet Take 1/2 (one-half) tablet by mouth twice daily  . Lancets (ACCU-CHEK MULTICLIX) lancets Use as instructed bid. E11.65  . lidocaine (LIDODERM) 5 % Place 1 patch onto the skin daily. Remove & Discard patch within 12 hours.  Apply to area of pain.  Marland Kitchen NIFEdipine (PROCARDIA-XL/ADALAT-CC/NIFEDICAL-XL) 30 MG 24 hr tablet take 1 tablet by mouth once daily  . pantoprazole (PROTONIX) 40 MG tablet Take 1 tablet (40 mg total) by mouth 2 (two) times daily.  . ramipril (ALTACE) 10 MG capsule Take 1 capsule (10 mg total) by mouth daily.  . sucralfate (CARAFATE) 1 GM/10ML suspension Take 10 mLs (  1 g total) by mouth 4 (four) times daily -  with meals and at bedtime.   No facility-administered encounter medications on file as of 01/02/2019.    ALLERGIES: No Known Allergies VACCINATION STATUS:  There is no immunization history on file for this patient.  Diabetes  He presents for his follow-up diabetic visit. He has type 2 diabetes mellitus. The initial diagnosis of diabetes was made 2 years ago. His disease course has been worsening. There are no hypoglycemic associated symptoms. Pertinent negatives for hypoglycemia include no confusion, headaches, pallor or seizures. There are no diabetic associated symptoms. Pertinent negatives for diabetes include no chest pain, no fatigue, no polydipsia, no polyphagia, no polyuria and no weakness. There are no  hypoglycemic complications. Symptoms are worsening. Diabetic complications include nephropathy. Risk factors for coronary artery disease include diabetes mellitus, dyslipidemia, hypertension, male sex, obesity, sedentary lifestyle and tobacco exposure. He is compliant with treatment most of the time. His weight is increasing steadily. He is following a generally unhealthy diet. He has had a previous visit with a dietitian. He never participates in exercise. (He did not bring any meter nor logs.  His previsit labs show A1c of 8% increasing from6.5%.  ) An ACE inhibitor/angiotensin II receptor blocker is being taken.  Hypertension  This is a chronic problem. The current episode started more than 1 year ago. The problem is uncontrolled. Pertinent negatives include no chest pain, headaches, neck pain, palpitations or shortness of breath. Risk factors for coronary artery disease include diabetes mellitus, dyslipidemia, obesity, sedentary lifestyle and smoking/tobacco exposure. Past treatments include angiotensin blockers.  Hyperlipidemia  This is a chronic problem. The current episode started more than 1 year ago. Exacerbating diseases include diabetes and obesity. Pertinent negatives include no chest pain, myalgias or shortness of breath. Current antihyperlipidemic treatment includes statins. Risk factors for coronary artery disease include diabetes mellitus, dyslipidemia, hypertension, family history, male sex, obesity and a sedentary lifestyle.    Review of Systems  Constitutional: Negative for fatigue and unexpected weight change.  HENT: Negative for dental problem, mouth sores and trouble swallowing.   Eyes: Negative for visual disturbance.  Respiratory: Negative for cough, choking, chest tightness, shortness of breath and wheezing.   Cardiovascular: Negative for chest pain, palpitations and leg swelling.  Gastrointestinal: Negative for abdominal distention, abdominal pain, constipation, diarrhea,  nausea and vomiting.  Endocrine: Negative for polydipsia, polyphagia and polyuria.  Genitourinary: Negative for dysuria, flank pain, hematuria and urgency.  Musculoskeletal: Negative for back pain, gait problem, myalgias and neck pain.  Skin: Negative for pallor, rash and wound.  Neurological: Negative for seizures, syncope, weakness, numbness and headaches.  Psychiatric/Behavioral: Negative.  Negative for confusion and dysphoric mood.    Objective:    BP (!) 150/86   Pulse 89   Ht 5' 6.5" (1.689 m)   Wt 261 lb (118.4 kg)   BMI 41.50 kg/m   Wt Readings from Last 3 Encounters:  01/02/19 261 lb (118.4 kg)  09/19/18 262 lb (118.8 kg)  05/19/18 254 lb (115.2 kg)    Physical Exam  Constitutional: He is oriented to person, place, and time. He appears well-developed. He is cooperative. No distress.  HENT:  Head: Normocephalic and atraumatic.  Eyes: EOM are normal.  Neck: Normal range of motion. Neck supple. No tracheal deviation present. No thyromegaly present.  Cardiovascular: Normal rate, S1 normal and S2 normal. Exam reveals no gallop.  No murmur heard. Pulses:      Dorsalis pedis pulses are 1+ on the  right side and 1+ on the left side.       Posterior tibial pulses are 1+ on the right side and 1+ on the left side.  Pulmonary/Chest: Effort normal. No respiratory distress. He has no wheezes.  Abdominal: He exhibits no distension. There is no abdominal tenderness. There is no guarding and no CVA tenderness.  Musculoskeletal:        General: No edema.     Right shoulder: He exhibits no swelling and no deformity.  Neurological: He is alert and oriented to person, place, and time. He has normal strength. No cranial nerve deficit or sensory deficit. Gait normal.  Skin: Skin is warm and dry. No rash noted. No cyanosis. Nails show no clubbing.  Psychiatric: He has a normal mood and affect. His speech is normal. Judgment normal. Cognition and memory are normal.    Results for orders  placed or performed in visit on 09/19/18  Hemoglobin A1c  Result Value Ref Range   Hgb A1c MFr Bld 8.0 (H) <5.7 % of total Hgb   Mean Plasma Glucose 183 (calc)   eAG (mmol/L) 10.1 (calc)  COMPLETE METABOLIC PANEL WITH GFR  Result Value Ref Range   Glucose, Bld 165 (H) 65 - 99 mg/dL   BUN 16 7 - 25 mg/dL   Creat 1.46 (H) 0.70 - 1.18 mg/dL   GFR, Est Non African American 46 (L) > OR = 60 mL/min/1.30m   GFR, Est African American 53 (L) > OR = 60 mL/min/1.7108m  BUN/Creatinine Ratio 11 6 - 22 (calc)   Sodium 142 135 - 146 mmol/L   Potassium 4.0 3.5 - 5.3 mmol/L   Chloride 107 98 - 110 mmol/L   CO2 25 20 - 32 mmol/L   Calcium 9.1 8.6 - 10.3 mg/dL   Total Protein 7.1 6.1 - 8.1 g/dL   Albumin 4.1 3.6 - 5.1 g/dL   Globulin 3.0 1.9 - 3.7 g/dL (calc)   AG Ratio 1.4 1.0 - 2.5 (calc)   Total Bilirubin 0.5 0.2 - 1.2 mg/dL   Alkaline phosphatase (APISO) 75 35 - 144 U/L   AST 12 10 - 35 U/L   ALT 15 9 - 46 U/L   Diabetic Labs (most recent): Lab Results  Component Value Date   HGBA1C 8.0 (H) 12/26/2018   HGBA1C 6.5 (H) 09/12/2018   HGBA1C 6.9 (H) 05/12/2018   Lipid Panel     Component Value Date/Time   CHOL 143 06/20/2017 0727   TRIG 75 06/20/2017 0727   HDL 47 06/20/2017 0727   CHOLHDL 3.0 06/20/2017 0727   VLDL 14 01/19/2016 0719   LDLCALC 80 06/20/2017 0727     Assessment & Plan:   1. 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 These are all discussed in detail with the patient.  -He came with increasing A1c of 8% from 6.5%.   -His  recent labs reviewed, showing stage 2-3 renal insufficiency.  - I have re-counseled the patient on diet management and weight loss  by adopting a carbohydrate restricted / protein rich  Diet.  - he  admits there is a room for improvement in his diet and drink choices. -  Suggestion is made for  him to avoid simple carbohydrates  from his diet including Cakes, Sweet Desserts / Pastries, Ice Cream, Soda (diet and regular), Sweet Tea, Candies,  Chips, Cookies, Sweet Pastries,  Store Bought Juices, Alcohol in Excess of  1-2 drinks a day, Artificial Sweeteners, Coffee Creamer, and "Sugar-free" Products. This will help patient to have stable blood glucose profile and potentially avoid unintended weight gain.  - Patient is advised to stick to a routine mealtimes to eat 3 meals  a day and avoid unnecessary snacks ( to snack only to correct hypoglycemia).  - I have approached patient with the following individualized plan to manage diabetes and patient agrees.   -Emphasizing the need for monitoring blood glucose at least twice a day- daily before breakfast and at bedtime, he is advised to continue glipizide 5 mg p.o. twice daily with breakfast and with supper. -He is also advised to continue Janumet 25/500 mg p.o. twice daily.    - He promises to do better on his diet and exercise. -He will not tolerate maximum dose of metformin due to CKD. -He will be considered for basal insulin if A1c remains above 8% next visit. - Patient specific target  for A1c; LDL, HDL, Triglycerides, and  Waist Circumference were discussed in detail.  2) BP/HTN: His blood pressure is not controlled to target.   He has 3 medications for his blood pressure, advised to be consistent with dose and take them in the morning at breakfast.  He is advised to continue   medications including hydrochlorothiazide 25 mg p.o. daily, Procardia 30 mg p.o. daily, ramipril 10 mg p.o. Daily.     3) Lipids/HPL: His recent lipid panel showed controlled LDL at 80.  He is advised to continue atorvastatin 20 mg p.o. nightly.    4)  Weight/Diet: CDE consult in progress, exercise, and carbohydrates information provided.  5) Chronic Care/Health Maintenance:  -Patient is on ACEI/ARB and Statin medications and encouraged to continue to follow  up with Ophthalmology, Podiatrist at least yearly or according to recommendations, and advised to stay away from smoking. I have recommended yearly flu vaccine and pneumonia vaccination at least every 5 years; moderate intensity exercise for up to 150 minutes weekly; and  sleep for at least 7 hours a day.  - I advised patient to maintain close follow up with Sharilyn Sites, MD for primary care needs.  - Time spent with the patient: 25 min, of which >50% was spent in reviewing his blood glucose logs , discussing his hypoglycemia and hyperglycemia episodes, reviewing his current and  previous labs / studies and medications  doses and developing a plan to avoid hypoglycemia and hyperglycemia. Please refer to Patient Instructions for Blood Glucose Monitoring and Insulin/Medications Dosing Guide"  in media tab for additional information. Please  also refer to " Patient Self Inventory" in the Media  tab for reviewed elements of pertinent patient history.  Evalee Jefferson participated in the discussions, expressed understanding, and voiced agreement with the above plans.  All questions were answered to his satisfaction. he is encouraged to contact clinic should he have any questions or concerns prior to his return visit.   Follow up plan: -Return in about 4 months (around 05/04/2019) for Follow up with Pre-visit Labs.  Glade Lloyd, MD Phone: [phone]  Fax: [phone]  -  This note was partially dictated with voice recognition software. Similar sounding words can be transcribed inadequately or may not  be corrected upon review.  01/02/2019, 8:45 AM

## 2019-01-02 NOTE — Patient Instructions (Signed)

## 2019-02-07 ENCOUNTER — Other Ambulatory Visit: Payer: Self-pay | Admitting: "Endocrinology

## 2019-03-09 ENCOUNTER — Other Ambulatory Visit: Payer: Self-pay

## 2019-03-09 MED ORDER — RAMIPRIL 10 MG PO CAPS: 10.0000 mg | ORAL_CAPSULE | Freq: Every day | ORAL | 0 refills | Status: DC

## 2019-04-24 ENCOUNTER — Other Ambulatory Visit: Payer: Self-pay | Admitting: "Endocrinology

## 2019-04-30 DIAGNOSIS — E1165 Type 2 diabetes mellitus with hyperglycemia: Secondary | ICD-10-CM | POA: Diagnosis not present

## 2019-04-30 DIAGNOSIS — E1122 Type 2 diabetes mellitus with diabetic chronic kidney disease: Secondary | ICD-10-CM | POA: Diagnosis not present

## 2019-04-30 DIAGNOSIS — N182 Chronic kidney disease, stage 2 (mild): Secondary | ICD-10-CM | POA: Diagnosis not present

## 2019-04-30 LAB — COMPLETE METABOLIC PANEL WITH GFR
AG Ratio: 1.5 (calc) (ref 1.0–2.5)
ALT: 13 U/L (ref 9–46)
AST: 12 U/L (ref 10–35)
Albumin: 4.2 g/dL (ref 3.6–5.1)
Alkaline phosphatase (APISO): 63 U/L (ref 35–144)
BUN/Creatinine Ratio: 14 (calc) (ref 6–22)
BUN: 20 mg/dL (ref 7–25)
CO2: 26 mmol/L (ref 20–32)
Calcium: 9 mg/dL (ref 8.6–10.3)
Chloride: 107 mmol/L (ref 98–110)
Creat: 1.39 mg/dL — ABNORMAL HIGH (ref 0.70–1.18)
GFR, Est African American: 57 mL/min/{1.73_m2} — ABNORMAL LOW (ref >=60)
GFR, Est Non African American: 49 mL/min/{1.73_m2} — ABNORMAL LOW (ref >=60)
Globulin: 2.8 g/dL (calc) (ref 1.9–3.7)
Glucose, Bld: 129 mg/dL — ABNORMAL HIGH (ref 65–99)
Potassium: 4.2 mmol/L (ref 3.5–5.3)
Sodium: 141 mmol/L (ref 135–146)
Total Bilirubin: 0.6 mg/dL (ref 0.2–1.2)
Total Protein: 7 g/dL (ref 6.1–8.1)

## 2019-04-30 LAB — HEMOGLOBIN A1C
Hgb A1c MFr Bld: 6.8 % of total Hgb — ABNORMAL HIGH (ref <5.7)
Mean Plasma Glucose: 148 (calc)
eAG (mmol/L): 8.2 (calc)

## 2019-05-04 DIAGNOSIS — Z23 Encounter for immunization: Secondary | ICD-10-CM | POA: Diagnosis not present

## 2019-05-07 ENCOUNTER — Other Ambulatory Visit: Payer: Self-pay

## 2019-05-07 ENCOUNTER — Encounter: Payer: Self-pay | Admitting: "Endocrinology

## 2019-05-07 ENCOUNTER — Ambulatory Visit: Payer: Medicare HMO | Admitting: "Endocrinology

## 2019-05-07 VITALS — BP 161/91 | HR 80 | Ht 66.5 in | Wt 267.0 lb

## 2019-05-07 DIAGNOSIS — E119 Type 2 diabetes mellitus without complications: Secondary | ICD-10-CM | POA: Diagnosis not present

## 2019-05-07 DIAGNOSIS — E1122 Type 2 diabetes mellitus with diabetic chronic kidney disease: Secondary | ICD-10-CM

## 2019-05-07 DIAGNOSIS — I10 Essential (primary) hypertension: Secondary | ICD-10-CM

## 2019-05-07 DIAGNOSIS — E1165 Type 2 diabetes mellitus with hyperglycemia: Secondary | ICD-10-CM

## 2019-05-07 DIAGNOSIS — E782 Mixed hyperlipidemia: Secondary | ICD-10-CM | POA: Diagnosis not present

## 2019-05-07 DIAGNOSIS — IMO0002 Reserved for concepts with insufficient information to code with codable children: Secondary | ICD-10-CM

## 2019-05-07 DIAGNOSIS — N182 Chronic kidney disease, stage 2 (mild): Secondary | ICD-10-CM | POA: Diagnosis not present

## 2019-05-07 NOTE — Patient Instructions (Signed)

## 2019-05-07 NOTE — Progress Notes (Signed)
05/07/2019  Endocrinology follow-up note   Subjective:    Patient ID: Kyle Hicks, male    DOB: 09-Jan-1941, PCP Sharilyn Sites, MD   Past Medical History:  Diagnosis Date  . Diabetes mellitus without complication ([place])   . HTN (hypertension)    Past Surgical History:  Procedure Laterality Date  . ESOPHAGOGASTRODUODENOSCOPY N/A 04/27/2017   Procedure: ESOPHAGOGASTRODUODENOSCOPY (EGD);  Surgeon: Rogene Houston, MD;  Location: [facility];  Service: Endoscopy;  Laterality: N/A;   Social History   Socioeconomic History  . Marital status: Married    Spouse name: Not on file  . Number of children: Not on file  . Years of education: Not on file  . Highest education level: Not on file  Occupational History  . Not on file  Social Needs  . Financial resource strain: Not on file  . Food insecurity    Worry: Not on file    Inability: Not on file  . Transportation needs    Medical: Not on file    Non-medical: Not on file  Tobacco Use  . Smoking status: Former Smoker    Packs/day: 0.50    Types: Cigarettes    Quit date: 07/26/1987    Years since quitting: 31.8  . Smokeless tobacco: Never Used  Substance and Sexual Activity  . Alcohol use: No    Alcohol/week: 0.0 standard drinks    Comment: Previously drank socially, last drank 25-30 years ago  . Drug use: No  . Sexual activity: Not on file  Lifestyle  . Physical activity    Days per week: Not on file    Minutes per session: Not on file  . Stress: Not on file  Relationships  . Social Herbalist on phone: Not on file    Gets together: Not on file    Attends religious service: Not on file    Active member of club or organization: Not on file    Attends meetings of clubs or organizations: Not on file    Relationship status: Not on file  Other Topics Concern  . Not on file  Social History Narrative  . Not on file   Outpatient Encounter Medications as of 05/07/2019  Medication Sig  . atorvastatin  (LIPITOR) 20 MG tablet Take 20 mg by mouth daily.  . blood glucose meter kit and supplies KIT Dispense based on patient and insurance preference. Use up to 2 times daily as directed. (FOR ICD-10 E11.65)  . diclofenac sodium (VOLTAREN) 1 % GEL Apply 2 grams twice daily to any areas of pain  . glipiZIDE (GLUCOTROL) 5 MG tablet TAKE 1 TABLET BY MOUTH TWICE DAILY BEFORE MEAL(S)  . glucose blood (ACCU-CHEK GUIDE) test strip Use as instructed bid E11.65  . hydrochlorothiazide (HYDRODIURIL) 25 MG tablet TAKE 1 TABLET(25 MG) BY MOUTH DAILY  . JANUMET 50-1000 MG tablet Take 1/2 (one-half) tablet by mouth twice daily  . Lancets (ACCU-CHEK MULTICLIX) lancets Use as instructed bid. E11.65  . lidocaine (LIDODERM) 5 % Place 1 patch onto the skin daily. Remove & Discard patch within 12 hours.  Apply to area of pain.  Marland Kitchen NIFEdipine (PROCARDIA-XL/ADALAT-CC/NIFEDICAL-XL) 30 MG 24 hr tablet take 1 tablet by mouth once daily  . pantoprazole (PROTONIX) 40 MG tablet Take 1 tablet (40 mg total) by mouth 2 (two) times daily.  . ramipril (ALTACE) 10 MG capsule Take 1 capsule (10 mg total) by mouth daily.  . sucralfate (CARAFATE) 1 GM/10ML suspension Take 10 mLs (1  g total) by mouth 4 (four) times daily -  with meals and at bedtime.   No facility-administered encounter medications on file as of 05/07/2019.    ALLERGIES: No Known Allergies VACCINATION STATUS:  There is no immunization history on file for this patient.  Diabetes Kyle Hicks presents for his follow-up diabetic visit. Kyle Hicks has type 2 diabetes mellitus. The initial diagnosis of diabetes was made 2 years ago. His disease course has been improving. There are no hypoglycemic associated symptoms. Pertinent negatives for hypoglycemia include no confusion, headaches, pallor or seizures. There are no diabetic associated symptoms. Pertinent negatives for diabetes include no chest pain, no fatigue, no polydipsia, no polyphagia, no polyuria and no weakness. There are no  hypoglycemic complications. Symptoms are improving. Diabetic complications include nephropathy. Risk factors for coronary artery disease include diabetes mellitus, dyslipidemia, hypertension, male sex, obesity, sedentary lifestyle and tobacco exposure. Kyle Hicks is compliant with treatment most of the time. His weight is increasing steadily. Kyle Hicks is following a generally unhealthy diet. Kyle Hicks has had a previous visit with a dietitian. Kyle Hicks never participates in exercise. An ACE inhibitor/angiotensin II receptor blocker is being taken.  Hypertension This is a chronic problem. The current episode started more than 1 year ago. The problem is uncontrolled. Pertinent negatives include no chest pain, headaches, neck pain, palpitations or shortness of breath. Risk factors for coronary artery disease include diabetes mellitus, dyslipidemia, obesity, sedentary lifestyle and smoking/tobacco exposure. Past treatments include angiotensin blockers.  Hyperlipidemia This is a chronic problem. The current episode started more than 1 year ago. Exacerbating diseases include diabetes and obesity. Pertinent negatives include no chest pain, myalgias or shortness of breath. Current antihyperlipidemic treatment includes statins. Risk factors for coronary artery disease include diabetes mellitus, dyslipidemia, hypertension, family history, male sex, obesity and a sedentary lifestyle.    Review of Systems  Constitutional: Negative for fatigue and unexpected weight change.  HENT: Negative for dental problem, mouth sores and trouble swallowing.   Eyes: Negative for visual disturbance.  Respiratory: Negative for cough, choking, chest tightness, shortness of breath and wheezing.   Cardiovascular: Negative for chest pain, palpitations and leg swelling.  Gastrointestinal: Negative for abdominal distention, abdominal pain, constipation, diarrhea, nausea and vomiting.  Endocrine: Negative for polydipsia, polyphagia and polyuria.  Genitourinary:  Negative for dysuria, flank pain, hematuria and urgency.  Musculoskeletal: Negative for back pain, gait problem, myalgias and neck pain.  Skin: Negative for pallor, rash and wound.  Neurological: Negative for seizures, syncope, weakness, numbness and headaches.  Psychiatric/Behavioral: Negative.  Negative for confusion and dysphoric mood.    Objective:    BP (!) 161/91   Pulse 80   Ht 5' 6.5" (1.689 m)   Wt 267 lb (121.1 kg)   BMI 42.45 kg/m   Wt Readings from Last 3 Encounters:  05/07/19 267 lb (121.1 kg)  01/02/19 261 lb (118.4 kg)  09/19/18 262 lb (118.8 kg)    Physical Exam  Constitutional: Kyle Hicks is oriented to person, place, and time. Kyle Hicks appears well-developed. Kyle Hicks is cooperative. No distress.  HENT:  Head: Normocephalic and atraumatic.  Eyes: EOM are normal.  Neck: Normal range of motion. Neck supple. No tracheal deviation present. No thyromegaly present.  Cardiovascular: Normal rate, S1 normal and S2 normal. Exam reveals no gallop.  No murmur heard. Pulses:      Dorsalis pedis pulses are 1+ on the right side and 1+ on the left side.       Posterior tibial pulses are 1+ on the right side and  1+ on the left side.  Pulmonary/Chest: Effort normal. No respiratory distress. Kyle Hicks has no wheezes.  Abdominal: Kyle Hicks exhibits no distension. There is no abdominal tenderness. There is no guarding and no CVA tenderness.  Musculoskeletal:        General: No edema.     Right shoulder: Kyle Hicks exhibits no swelling and no deformity.  Neurological: Kyle Hicks is alert and oriented to person, place, and time. Kyle Hicks has normal strength. No cranial nerve deficit or sensory deficit. Gait normal.  Skin: Skin is warm and dry. No rash noted. No cyanosis. Nails show no clubbing.  Psychiatric: Kyle Hicks has a normal mood and affect. His speech is normal. Judgment normal. Cognition and memory are normal.    Results for orders placed or performed in visit on 01/02/19  Hemoglobin A1c  Result Value Ref Range   Hgb A1c MFr Bld  6.8 (H) <5.7 % of total Hgb   Mean Plasma Glucose 148 (calc)   eAG (mmol/L) 8.2 (calc)  COMPLETE METABOLIC PANEL WITH GFR  Result Value Ref Range   Glucose, Bld 129 (H) 65 - 99 mg/dL   BUN 20 7 - 25 mg/dL   Creat 1.39 (H) 0.70 - 1.18 mg/dL   GFR, Est Non African American 49 (L) > OR = 60 mL/min/1.56m   GFR, Est African American 57 (L) > OR = 60 mL/min/1.731m  BUN/Creatinine Ratio 14 6 - 22 (calc)   Sodium 141 135 - 146 mmol/L   Potassium 4.2 3.5 - 5.3 mmol/L   Chloride 107 98 - 110 mmol/L   CO2 26 20 - 32 mmol/L   Calcium 9.0 8.6 - 10.3 mg/dL   Total Protein 7.0 6.1 - 8.1 g/dL   Albumin 4.2 3.6 - 5.1 g/dL   Globulin 2.8 1.9 - 3.7 g/dL (calc)   AG Ratio 1.5 1.0 - 2.5 (calc)   Total Bilirubin 0.6 0.2 - 1.2 mg/dL   Alkaline phosphatase (APISO) 63 35 - 144 U/L   AST 12 10 - 35 U/L   ALT 13 9 - 46 U/L   Diabetic Labs (most recent): Lab Results  Component Value Date   HGBA1C 6.8 (H) 04/30/2019   HGBA1C 8.0 (H) 12/26/2018   HGBA1C 6.5 (H) 09/12/2018   Lipid Panel     Component Value Date/Time   CHOL 143 06/20/2017 0727   TRIG 75 06/20/2017 0727   HDL 47 06/20/2017 0727   CHOLHDL 3.0 06/20/2017 0727   VLDL 14 01/19/2016 0719   LDLCALC 80 06/20/2017 0727     Assessment & Plan:   1. 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 These are all discussed in detail with the patient.  -Kyle Hicks came with improving A1c of 6.8%, improving from 8%.  -His  recent labs reviewed, showing stage 2-3 renal insufficiency.  - I have re-counseled the patient on diet management and weight loss  by adopting a carbohydrate restricted / protein rich  Diet.  - Kyle Hicks  admits there is a room for improvement in his diet and drink choices. -  Suggestion is made for him to avoid simple carbohydrates  from his diet including Cakes, Sweet Desserts / Pastries,  Ice Cream, Soda (diet and regular), Sweet Tea, Candies, Chips, Cookies, Sweet Pastries,  Store Bought Juices, Alcohol in Excess of  1-2 drinks a day, Artificial Sweeteners, Coffee Creamer, and "Sugar-free" Products.  This will help patient to have stable blood glucose profile and potentially avoid unintended weight gain.   - Patient is advised to stick to a routine mealtimes to eat 3 meals  a day and avoid unnecessary snacks ( to snack only to correct hypoglycemia).  - I have approached patient with the following individualized plan to manage diabetes and patient agrees.   -Emphasizing the need for monitoring blood glucose at least twice a day-daily before breakfast and at bedtime, Kyle Hicks is advised to continue Janumet 25/500 mg p.o. twice daily after breakfast and after supper.    -Kyle Hicks is also benefiting from glipizide treatment.  I discussed and continue glipizide 5 mg p.o. twice daily with breakfast and with supper.   - Kyle Hicks promises to do better on his diet and exercise. -Kyle Hicks will not tolerate maximum dose of metformin due to CKD. -Kyle Hicks will be considered for basal insulin if A1c remains above 8% next visit. - Patient specific target  for A1c; LDL, HDL, Triglycerides, and  Waist Circumference were discussed in detail.  2) BP/HTN: His blood pressure is not controlled to target.   Kyle Hicks has 3 medications for his blood pressure, advised to be consistent with dose and take them in the morning at breakfast.  Kyle Hicks is advised to continue   medications including hydrochlorothiazide 25 mg p.o. daily, Procardia 30 mg p.o. daily, ramipril 10 mg p.o. Daily.     3) Lipids/HPL: His recent lipid panel showed controlled LDL at 80.  Kyle Hicks is advised to continue atorvastatin 20 mg p.o. nightly.     4)  Weight/Diet: CDE consult in progress, exercise, and carbohydrates information provided.  5) Chronic Care/Health Maintenance:  -Patient is on ACEI/ARB and Statin medications and encouraged to continue to follow up with  Ophthalmology, Podiatrist at least yearly or according to recommendations, and advised to stay away from smoking. I have recommended yearly flu vaccine and pneumonia vaccination at least every 5 years; moderate intensity exercise for up to 150 minutes weekly; and  sleep for at least 7 hours a day.  - I advised patient to maintain close follow up with Sharilyn Sites, MD for primary care needs.  - Time spent with the patient: 25 min, of which >50% was spent in reviewing his  current and  previous labs/studies, previous treatments, and medications doses and developing a plan for long-term care based on the latest recommendations for standards of care. Please refer to " Patient Self Inventory" in the Media  tab for reviewed elements of pertinent patient history.  Kyle Hicks participated in the discussions, expressed understanding, and voiced agreement with the above plans.  All questions were answered to his satisfaction. Kyle Hicks is encouraged to contact clinic should Kyle Hicks have any questions or concerns prior to his return visit.    Follow up plan: -Return in about 4 months (around 09/07/2019) for Bring Meter and Logs- A1c in Office.  Glade Lloyd, MD Phone: [phone]  Fax: [phone]  -  This note was partially dictated with voice recognition software. Similar sounding words can be transcribed inadequately or may not  be corrected upon review.  05/07/2019, 9:39 AM

## 2019-07-09 ENCOUNTER — Other Ambulatory Visit: Payer: Self-pay | Admitting: "Endocrinology

## 2019-07-23 ENCOUNTER — Other Ambulatory Visit: Payer: Self-pay | Admitting: "Endocrinology

## 2019-09-10 ENCOUNTER — Encounter: Payer: Self-pay | Admitting: "Endocrinology

## 2019-09-10 ENCOUNTER — Ambulatory Visit (INDEPENDENT_AMBULATORY_CARE_PROVIDER_SITE_OTHER): Payer: Medicare HMO | Admitting: "Endocrinology

## 2019-09-10 ENCOUNTER — Other Ambulatory Visit: Payer: Self-pay

## 2019-09-10 VITALS — BP 160/94 | HR 94 | Ht 66.5 in | Wt 265.2 lb

## 2019-09-10 DIAGNOSIS — E1165 Type 2 diabetes mellitus with hyperglycemia: Secondary | ICD-10-CM | POA: Diagnosis not present

## 2019-09-10 DIAGNOSIS — IMO0002 Reserved for concepts with insufficient information to code with codable children: Secondary | ICD-10-CM

## 2019-09-10 DIAGNOSIS — I10 Essential (primary) hypertension: Secondary | ICD-10-CM

## 2019-09-10 DIAGNOSIS — E119 Type 2 diabetes mellitus without complications: Secondary | ICD-10-CM | POA: Diagnosis not present

## 2019-09-10 DIAGNOSIS — E782 Mixed hyperlipidemia: Secondary | ICD-10-CM | POA: Diagnosis not present

## 2019-09-10 DIAGNOSIS — N182 Chronic kidney disease, stage 2 (mild): Secondary | ICD-10-CM | POA: Diagnosis not present

## 2019-09-10 DIAGNOSIS — E1122 Type 2 diabetes mellitus with diabetic chronic kidney disease: Secondary | ICD-10-CM | POA: Diagnosis not present

## 2019-09-10 LAB — POCT GLYCOSYLATED HEMOGLOBIN (HGB A1C): Hemoglobin A1C: 6.9 % — AB (ref 4.0–5.6)

## 2019-09-10 NOTE — Progress Notes (Signed)
09/10/2019  Endocrinology follow-up note   Subjective:    Patient ID: Kyle Hicks, male    DOB: [birth year], PCP Sharilyn Sites, MD   Past Medical History:  Diagnosis Date  . Diabetes mellitus without complication ([place])   . HTN (hypertension)    Past Surgical History:  Procedure Laterality Date  . ESOPHAGOGASTRODUODENOSCOPY N/A 04/27/2017   Procedure: ESOPHAGOGASTRODUODENOSCOPY (EGD);  Surgeon: Rogene Houston, MD;  Location: [facility];  Service: Endoscopy;  Laterality: N/A;   Social History   Socioeconomic History  . Marital status: Married    Spouse name: Not on file  . Number of children: Not on file  . Years of education: Not on file  . Highest education level: Not on file  Occupational History  . Not on file  Tobacco Use  . Smoking status: Former Smoker    Packs/day: 0.50    Types: Cigarettes    Quit date: 07/26/1987    Years since quitting: 32.1  . Smokeless tobacco: Never Used  Substance and Sexual Activity  . Alcohol use: No    Alcohol/week: 0.0 standard drinks    Comment: Previously drank socially, last drank 25-30 years ago  . Drug use: No  . Sexual activity: Not on file  Other Topics Concern  . Not on file  Social History Narrative  . Not on file   Social Determinants of Health   Financial Resource Strain:   . Difficulty of Paying Living Expenses: Not on file  Food Insecurity:   . Worried About Charity fundraiser in the Last Year: Not on file  . Ran Out of Food in the Last Year: Not on file  Transportation Needs:   . Lack of Transportation (Medical): Not on file  . Lack of Transportation (Non-Medical): Not on file  Physical Activity:   . Days of Exercise per Week: Not on file  . Minutes of Exercise per Session: Not on file  Stress:   . Feeling of Stress : Not on file  Social Connections:   . Frequency of Communication with Friends and Family: Not on file  . Frequency of Social Gatherings with Friends and Family: Not on file  . Attends  Religious Services: Not on file  . Active Member of Clubs or Organizations: Not on file  . Attends Archivist Meetings: Not on file  . Marital Status: Not on file   Outpatient Encounter Medications as of 09/10/2019  Medication Sig  . atorvastatin (LIPITOR) 20 MG tablet Take 20 mg by mouth daily.  . blood glucose meter kit and supplies KIT Dispense based on patient and insurance preference. Use up to 2 times daily as directed. (FOR ICD-10 E11.65)  . glipiZIDE (GLUCOTROL) 5 MG tablet TAKE 1 TABLET BY MOUTH TWICE DAILY BEFORE MEAL(S)  . glucose blood (ACCU-CHEK GUIDE) test strip Use as instructed bid E11.65  . hydrochlorothiazide (HYDRODIURIL) 25 MG tablet TAKE 1 TABLET(25 MG) BY MOUTH DAILY  . JANUMET 50-1000 MG tablet Take 1/2 (one-half) tablet by mouth twice daily  . Lancets (ACCU-CHEK MULTICLIX) lancets Use as instructed bid. E11.65  . NIFEdipine (PROCARDIA-XL/ADALAT-CC/NIFEDICAL-XL) 30 MG 24 hr tablet take 1 tablet by mouth once daily  . ramipril (ALTACE) 10 MG capsule Take 1 capsule by mouth once daily  . [DISCONTINUED] diclofenac sodium (VOLTAREN) 1 % GEL Apply 2 grams twice daily to any areas of pain  . [DISCONTINUED] lidocaine (LIDODERM) 5 % Place 1 patch onto the skin daily. Remove & Discard patch within 12 hours.  Apply to area of pain.  . [DISCONTINUED] pantoprazole (PROTONIX) 40 MG tablet Take 1 tablet (40 mg total) by mouth 2 (two) times daily.  . [DISCONTINUED] sucralfate (CARAFATE) 1 GM/10ML suspension Take 10 mLs (1 g total) by mouth 4 (four) times daily -  with meals and at bedtime.   No facility-administered encounter medications on file as of 09/10/2019.   ALLERGIES: No Known Allergies VACCINATION STATUS:  There is no immunization history on file for this patient.  Diabetes He presents for his follow-up diabetic visit. He has type 2 diabetes mellitus. The initial diagnosis of diabetes was made 2 years ago. His disease course has been stable. There are no  hypoglycemic associated symptoms. Pertinent negatives for hypoglycemia include no confusion, headaches, pallor or seizures. There are no diabetic associated symptoms. Pertinent negatives for diabetes include no chest pain, no fatigue, no polydipsia, no polyphagia, no polyuria and no weakness. There are no hypoglycemic complications. Symptoms are improving. Diabetic complications include nephropathy. Risk factors for coronary artery disease include diabetes mellitus, dyslipidemia, hypertension, male sex, obesity, sedentary lifestyle and tobacco exposure. He is compliant with treatment most of the time. His weight is fluctuating minimally. He is following a generally unhealthy diet. He has had a previous visit with a dietitian. He never participates in exercise. His overall blood glucose range is 130-140 mg/dl. An ACE inhibitor/angiotensin II receptor blocker is being taken.  Hypertension This is a chronic problem. The current episode started more than 1 year ago. The problem is uncontrolled. Pertinent negatives include no chest pain, headaches, neck pain, palpitations or shortness of breath. Risk factors for coronary artery disease include diabetes mellitus, dyslipidemia, obesity, sedentary lifestyle and smoking/tobacco exposure. Past treatments include angiotensin blockers.  Hyperlipidemia This is a chronic problem. The current episode started more than 1 year ago. Exacerbating diseases include diabetes and obesity. Pertinent negatives include no chest pain, myalgias or shortness of breath. Current antihyperlipidemic treatment includes statins. Risk factors for coronary artery disease include diabetes mellitus, dyslipidemia, hypertension, family history, male sex, obesity and a sedentary lifestyle.    Review of Systems  Constitutional: Negative for fatigue and unexpected weight change.  HENT: Negative for dental problem, mouth sores and trouble swallowing.   Eyes: Negative for visual disturbance.   Respiratory: Negative for cough, choking, chest tightness, shortness of breath and wheezing.   Cardiovascular: Negative for chest pain, palpitations and leg swelling.  Gastrointestinal: Negative for abdominal distention, abdominal pain, constipation, diarrhea, nausea and vomiting.  Endocrine: Negative for polydipsia, polyphagia and polyuria.  Genitourinary: Negative for dysuria, flank pain, hematuria and urgency.  Musculoskeletal: Negative for back pain, gait problem, myalgias and neck pain.  Skin: Negative for pallor, rash and wound.  Neurological: Negative for seizures, syncope, weakness, numbness and headaches.  Psychiatric/Behavioral: Negative.  Negative for confusion and dysphoric mood.    Objective:    BP (!) 160/94   Pulse 94   Ht 5' 6.5" (1.689 m)   Wt 265 lb 3.2 oz (120.3 kg)   BMI 42.16 kg/m   Wt Readings from Last 3 Encounters:  09/10/19 265 lb 3.2 oz (120.3 kg)  05/07/19 267 lb (121.1 kg)  01/02/19 261 lb (118.4 kg)    Physical Exam  Constitutional: He is oriented to person, place, and time. He appears well-developed. He is cooperative. No distress.  HENT:  Head: Normocephalic and atraumatic.  Eyes: EOM are normal.  Neck: No tracheal deviation present. No thyromegaly present.  Cardiovascular: Normal rate, S1 normal and S2 normal. Exam reveals no  gallop.  No murmur heard. Pulses:      Dorsalis pedis pulses are 1+ on the right side and 1+ on the left side.       Posterior tibial pulses are 1+ on the right side and 1+ on the left side.  Pulmonary/Chest: Effort normal. No respiratory distress. He has no wheezes.  Abdominal: He exhibits no distension. There is no abdominal tenderness. There is no guarding and no CVA tenderness.  Musculoskeletal:        General: No edema.     Right shoulder: No swelling or deformity.     Cervical back: Normal range of motion and neck supple.  Neurological: He is alert and oriented to person, place, and time. He has normal strength. No  cranial nerve deficit or sensory deficit. Gait normal.  Skin: Skin is warm and dry. No rash noted. No cyanosis. Nails show no clubbing.  Psychiatric: He has a normal mood and affect. His speech is normal. Judgment normal. Cognition and memory are normal.    Results for orders placed or performed in visit on 09/10/19  HgB A1c  Result Value Ref Range   Hemoglobin A1C 6.9 (A) 4.0 - 5.6 %   HbA1c POC (<> result, manual entry)     HbA1c, POC (prediabetic range)     HbA1c, POC (controlled diabetic range)     Diabetic Labs (most recent): Lab Results  Component Value Date   HGBA1C 6.9 (A) 09/10/2019   HGBA1C 6.8 (H) 04/30/2019   HGBA1C 8.0 (H) 12/26/2018   Lipid Panel     Component Value Date/Time   CHOL 143 06/20/2017 0727   TRIG 75 06/20/2017 0727   HDL 47 06/20/2017 0727   CHOLHDL 3.0 06/20/2017 0727   VLDL 14 01/19/2016 0719   LDLCALC 80 06/20/2017 0727     Assessment & Plan:   1. 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 These are all discussed in detail with the patient.  -He came with stable A1c of 6.9%, overall improving from 8%.   -His  recent labs reviewed, showing stage 2-3 renal insufficiency.  - I have re-counseled the patient on diet management and weight loss  by adopting a carbohydrate restricted / protein rich  Diet.  - he  admits there is a room for improvement in his diet and drink choices. -  Suggestion is made for him to avoid simple carbohydrates  from his diet including Cakes, Sweet Desserts / Pastries, Ice Cream, Soda (diet and regular), Sweet Tea, Candies, Chips, Cookies, Sweet Pastries,  Store Bought Juices, Alcohol in Excess of  1-2 drinks a day, Artificial Sweeteners, Coffee Creamer, and "Sugar-free" Products. This will help patient to have stable blood glucose profile and potentially avoid unintended  weight gain.    - Patient is advised to stick to a routine mealtimes to eat 3 meals  a day and avoid unnecessary snacks ( to snack only to correct hypoglycemia).  - I have approached patient with the following individualized plan to manage diabetes and patient agrees.   -Emphasizing the need for monitoring blood glucose at least twice a day-daily before breakfast and at bedtime, he is advised to continue Janumet  25/500 mg p.o. twice daily after breakfast and after supper.    -He continues to benefit from low-dose glipizide treatment.  He is advised to continue glipizide 5  mg p.o. twice daily with breakfast and with supper.   - He promises to do better on his diet and exercise. -He will not tolerate maximum dose of metformin due to CKD. -He will be considered for basal insulin if A1c remains above 8% next visit. - Patient specific target  for A1c; LDL, HDL, Triglycerides, and  Waist Circumference were discussed in detail.  2) BP/HTN: His blood pressure is not controlled to target.   He has 3 medications for his blood pressure, advised to be consistent with dose and take them in the morning at breakfast.  He is advised to continue  medications including hydrochlorothiazide 25 mg p.o. daily, Procardia 30 mg p.o. daily, ramipril 10 mg p.o. Daily.     3) Lipids/HPL: His recent lipid panel showed controlled LDL at 80.  He is advised to continue atorvastatin 20 mg p.o. nightly.  Side effects and precautions discussed with him.    4)  Weight/Diet: CDE consult in progress, exercise, and carbohydrates information provided.  5) Chronic Care/Health Maintenance:  -Patient is on ACEI/ARB and Statin medications and encouraged to continue to follow up with Ophthalmology, Podiatrist at least yearly or according to recommendations, and advised to stay away from smoking. I have recommended yearly flu vaccine and pneumonia vaccination at least every 5 years; moderate intensity exercise for up to 150 minutes  weekly; and  sleep for at least 7 hours a day.  - I advised patient to maintain close follow up with Sharilyn Sites, MD for primary care needs.  - Time spent on this patient care encounter:  35 min, of which >50% was spent in  counseling and the rest reviewing his  current and  previous labs/studies ( including abstraction from other facilities),  previous treatments, his blood glucose readings, and medications' doses and developing a plan for long-term care based on the latest recommendations for standards of care; and documenting his care.  Evalee Jefferson participated in the discussions, expressed understanding, and voiced agreement with the above plans.  All questions were answered to his satisfaction. he is encouraged to contact clinic should he have any questions or concerns prior to his return visit.     Follow up plan: -Return in about 3 months (around 12/08/2019) for Bring Meter and Logs- A1c in Office, Follow up with Pre-visit Labs.  Glade Lloyd, MD Phone: [phone]  Fax: [phone]  -  This note was partially dictated with voice recognition software. Similar sounding words can be transcribed inadequately or may not  be corrected upon review.  09/10/2019, 8:40 AM

## 2019-09-10 NOTE — Patient Instructions (Signed)

## 2019-09-23 ENCOUNTER — Ambulatory Visit: Payer: Medicare HMO | Attending: Internal Medicine

## 2019-09-23 DIAGNOSIS — Z23 Encounter for immunization: Secondary | ICD-10-CM

## 2019-09-23 NOTE — Progress Notes (Signed)
   Covid-19 Vaccination Clinic  Name:  JETT DIVENS    MRN: [id number] DOB: [birth year]  09/23/2019  Mr. Novello was observed post Covid-19 immunization for 15 minutes without incidence. He was provided with Vaccine Information Sheet and instruction to access the V-Safe system.   Mr. Waltman was instructed to call 911 with any severe reactions post vaccine: Marland Kitchen Difficulty breathing  . Swelling of your face and throat  . A fast heartbeat  . A bad rash all over your body  . Dizziness and weakness    Immunizations Administered    Name Date Dose VIS Date Route   Moderna COVID-19 Vaccine 09/23/2019 12:43 PM 0.5 mL 06/26/2019 Intramuscular   Manufacturer: Moderna   Lot: [id number]   [id number]

## 2019-09-25 ENCOUNTER — Ambulatory Visit: Payer: Medicare HMO

## 2019-10-09 ENCOUNTER — Other Ambulatory Visit: Payer: Self-pay | Admitting: "Endocrinology

## 2019-10-17 DIAGNOSIS — E119 Type 2 diabetes mellitus without complications: Secondary | ICD-10-CM | POA: Diagnosis not present

## 2019-10-17 DIAGNOSIS — Z6841 Body Mass Index (BMI) 40.0 and over, adult: Secondary | ICD-10-CM | POA: Diagnosis not present

## 2019-10-17 DIAGNOSIS — E782 Mixed hyperlipidemia: Secondary | ICD-10-CM | POA: Diagnosis not present

## 2019-10-17 DIAGNOSIS — E785 Hyperlipidemia, unspecified: Secondary | ICD-10-CM | POA: Diagnosis not present

## 2019-10-17 DIAGNOSIS — Z1389 Encounter for screening for other disorder: Secondary | ICD-10-CM | POA: Diagnosis not present

## 2019-10-17 DIAGNOSIS — E1165 Type 2 diabetes mellitus with hyperglycemia: Secondary | ICD-10-CM | POA: Diagnosis not present

## 2019-10-17 DIAGNOSIS — I10 Essential (primary) hypertension: Secondary | ICD-10-CM | POA: Diagnosis not present

## 2019-10-17 DIAGNOSIS — E781 Pure hyperglyceridemia: Secondary | ICD-10-CM | POA: Diagnosis not present

## 2019-10-17 DIAGNOSIS — Z0001 Encounter for general adult medical examination with abnormal findings: Secondary | ICD-10-CM | POA: Diagnosis not present

## 2019-10-17 DIAGNOSIS — Z125 Encounter for screening for malignant neoplasm of prostate: Secondary | ICD-10-CM | POA: Diagnosis not present

## 2019-10-22 ENCOUNTER — Other Ambulatory Visit: Payer: Self-pay | Admitting: "Endocrinology

## 2019-10-27 ENCOUNTER — Ambulatory Visit: Payer: Medicare HMO | Attending: Internal Medicine

## 2019-10-27 DIAGNOSIS — Z23 Encounter for immunization: Secondary | ICD-10-CM

## 2019-10-27 NOTE — Progress Notes (Signed)
   Covid-19 Vaccination Clinic  Name:  Kyle Hicks    MRN: [id number] DOB: 11-Aug-1940  10/27/2019  Mr. [name] was observed post Covid-19 immunization for 15 minutes without incident. He was provided with Vaccine Information Sheet and instruction to access the V-Safe system.   Mr. [name] was instructed to call 911 with any severe reactions post vaccine: Marland Kitchen Difficulty breathing  . Swelling of face and throat  . A fast heartbeat  . A bad rash all over body  . Dizziness and weakness   Immunizations Administered    Name Date Dose VIS Date Route   Moderna COVID-19 Vaccine 10/27/2019 12:41 PM 0.5 mL 06/26/2019 Intramuscular   Manufacturer: Moderna   Lot: [id number]   [place]: [id number]

## 2019-12-04 DIAGNOSIS — E1122 Type 2 diabetes mellitus with diabetic chronic kidney disease: Secondary | ICD-10-CM | POA: Diagnosis not present

## 2019-12-04 DIAGNOSIS — N182 Chronic kidney disease, stage 2 (mild): Secondary | ICD-10-CM | POA: Diagnosis not present

## 2019-12-04 DIAGNOSIS — E1165 Type 2 diabetes mellitus with hyperglycemia: Secondary | ICD-10-CM | POA: Diagnosis not present

## 2019-12-05 LAB — T4, FREE: Free T4: 1.2 ng/dL (ref 0.8–1.8)

## 2019-12-05 LAB — MICROALBUMIN / CREATININE URINE RATIO
Creatinine, Urine: 232 mg/dL (ref 20–320)
Microalb Creat Ratio: 612 mcg/mg creat — ABNORMAL HIGH (ref <30)
Microalb, Ur: 142 mg/dL

## 2019-12-10 ENCOUNTER — Ambulatory Visit (INDEPENDENT_AMBULATORY_CARE_PROVIDER_SITE_OTHER): Payer: Medicare HMO | Admitting: "Endocrinology

## 2019-12-10 ENCOUNTER — Encounter: Payer: Self-pay | Admitting: "Endocrinology

## 2019-12-10 ENCOUNTER — Other Ambulatory Visit: Payer: Self-pay

## 2019-12-10 VITALS — BP 171/87 | HR 82 | Ht 65.5 in

## 2019-12-10 DIAGNOSIS — IMO0002 Reserved for concepts with insufficient information to code with codable children: Secondary | ICD-10-CM

## 2019-12-10 DIAGNOSIS — N182 Chronic kidney disease, stage 2 (mild): Secondary | ICD-10-CM | POA: Diagnosis not present

## 2019-12-10 DIAGNOSIS — E1122 Type 2 diabetes mellitus with diabetic chronic kidney disease: Secondary | ICD-10-CM | POA: Diagnosis not present

## 2019-12-10 DIAGNOSIS — I10 Essential (primary) hypertension: Secondary | ICD-10-CM | POA: Diagnosis not present

## 2019-12-10 DIAGNOSIS — E782 Mixed hyperlipidemia: Secondary | ICD-10-CM | POA: Diagnosis not present

## 2019-12-10 DIAGNOSIS — E1165 Type 2 diabetes mellitus with hyperglycemia: Secondary | ICD-10-CM

## 2019-12-10 DIAGNOSIS — E119 Type 2 diabetes mellitus without complications: Secondary | ICD-10-CM | POA: Diagnosis not present

## 2019-12-10 LAB — POCT GLYCOSYLATED HEMOGLOBIN (HGB A1C): Hemoglobin A1C: 6.9 % — AB (ref 4.0–5.6)

## 2019-12-10 MED ORDER — JANUMET 50-1000 MG PO TABS: ORAL_TABLET | ORAL | 2 refills | Status: DC

## 2019-12-10 MED ORDER — GLIPIZIDE 5 MG PO TABS: 5.0000 mg | ORAL_TABLET | Freq: Every day | ORAL | 1 refills | Status: DC

## 2019-12-10 NOTE — Progress Notes (Signed)
12/10/2019  Endocrinology follow-up note   Subjective:    Patient ID: Kyle Hicks, male    DOB: [birth year], PCP Sharilyn Sites, MD   Past Medical History:  Diagnosis Date  . Diabetes mellitus without complication ([place])   . HTN (hypertension)    Past Surgical History:  Procedure Laterality Date  . ESOPHAGOGASTRODUODENOSCOPY N/A 04/27/2017   Procedure: ESOPHAGOGASTRODUODENOSCOPY (EGD);  Surgeon: Rogene Houston, MD;  Location: [facility];  Service: Endoscopy;  Laterality: N/A;   Social History   Socioeconomic History  . Marital status: Married    Spouse name: Not on file  . Number of children: Not on file  . Years of education: Not on file  . Highest education level: Not on file  Occupational History  . Not on file  Tobacco Use  . Smoking status: Former Smoker    Packs/day: 0.50    Types: Cigarettes    Quit date: 07/26/1987    Years since quitting: 32.3  . Smokeless tobacco: Never Used  Substance and Sexual Activity  . Alcohol use: No    Alcohol/week: 0.0 standard drinks    Comment: Previously drank socially, last drank 25-30 years ago  . Drug use: No  . Sexual activity: Not on file  Other Topics Concern  . Not on file  Social History Narrative  . Not on file   Social Determinants of Health   Financial Resource Strain:   . Difficulty of Paying Living Expenses:   Food Insecurity:   . Worried About Charity fundraiser in the Last Year:   . Arboriculturist in the Last Year:   Transportation Needs:   . Film/video editor (Medical):   Marland Kitchen Lack of Transportation (Non-Medical):   Physical Activity:   . Days of Exercise per Week:   . Minutes of Exercise per Session:   Stress:   . Feeling of Stress :   Social Connections:   . Frequency of Communication with Friends and Family:   . Frequency of Social Gatherings with Friends and Family:   . Attends Religious Services:   . Active Member of Clubs or Organizations:   . Attends Archivist Meetings:    Marland Kitchen Marital Status:    Outpatient Encounter Medications as of 12/10/2019  Medication Sig  . atorvastatin (LIPITOR) 20 MG tablet Take 20 mg by mouth daily.  . blood glucose meter kit and supplies KIT Dispense based on patient and insurance preference. Use up to 2 times daily as directed. (FOR ICD-10 E11.65)  . glipiZIDE (GLUCOTROL) 5 MG tablet TAKE 1 TABLET BY MOUTH TWICE DAILY BEFORE MEAL(S)  . glucose blood (ACCU-CHEK GUIDE) test strip Use as instructed bid E11.65  . hydrochlorothiazide (HYDRODIURIL) 25 MG tablet TAKE 1 TABLET(25 MG) BY MOUTH DAILY  . JANUMET 50-1000 MG tablet Take 1/2 (one-half) tablet by mouth twice daily  . Lancets (ACCU-CHEK MULTICLIX) lancets Use as instructed bid. E11.65  . NIFEdipine (PROCARDIA-XL/ADALAT-CC/NIFEDICAL-XL) 30 MG 24 hr tablet take 1 tablet by mouth once daily  . ramipril (ALTACE) 10 MG capsule Take 1 capsule by mouth once daily   No facility-administered encounter medications on file as of 12/10/2019.   ALLERGIES: No Known Allergies VACCINATION STATUS: Immunization History  Administered Date(s) Administered  . Moderna SARS-COVID-2 Vaccination 09/23/2019, 10/27/2019    Diabetes He presents for his follow-up diabetic visit. He has type 2 diabetes mellitus. The initial diagnosis of diabetes was made 2 years ago. His disease course has been stable. There are no  hypoglycemic associated symptoms. Pertinent negatives for hypoglycemia include no confusion, headaches, pallor or seizures. There are no diabetic associated symptoms. Pertinent negatives for diabetes include no chest pain, no fatigue, no polydipsia, no polyphagia, no polyuria and no weakness. There are no hypoglycemic complications. Symptoms are stable. Diabetic complications include nephropathy. Risk factors for coronary artery disease include diabetes mellitus, dyslipidemia, hypertension, male sex, obesity, sedentary lifestyle and tobacco exposure. He is compliant with treatment most of the time.  His weight is fluctuating minimally. He is following a generally unhealthy diet. He has had a previous visit with a dietitian. He never participates in exercise. His home blood glucose trend is fluctuating minimally. (He reports glycemic profile between 80-140.  He did not bring meter nor logs.  His point-of-care A1c is unchanged at 6.9%.  He denies hypoglycemia.) An ACE inhibitor/angiotensin II receptor blocker is being taken.  Hypertension This is a chronic problem. The current episode started more than 1 year ago. The problem is uncontrolled. Pertinent negatives include no chest pain, headaches, neck pain, palpitations or shortness of breath. Risk factors for coronary artery disease include diabetes mellitus, dyslipidemia, obesity, sedentary lifestyle and smoking/tobacco exposure. Past treatments include angiotensin blockers.  Hyperlipidemia This is a chronic problem. The current episode started more than 1 year ago. Exacerbating diseases include diabetes and obesity. Pertinent negatives include no chest pain, myalgias or shortness of breath. Current antihyperlipidemic treatment includes statins. Risk factors for coronary artery disease include diabetes mellitus, dyslipidemia, hypertension, family history, male sex, obesity and a sedentary lifestyle.    Review of Systems  Constitutional: Negative for fatigue and unexpected weight change.  HENT: Negative for dental problem, mouth sores and trouble swallowing.   Eyes: Negative for visual disturbance.  Respiratory: Negative for cough, choking, chest tightness, shortness of breath and wheezing.   Cardiovascular: Negative for chest pain, palpitations and leg swelling.  Gastrointestinal: Negative for abdominal distention, abdominal pain, constipation, diarrhea, nausea and vomiting.  Endocrine: Negative for polydipsia, polyphagia and polyuria.  Genitourinary: Negative for dysuria, flank pain, hematuria and urgency.  Musculoskeletal: Negative for back  pain, gait problem, myalgias and neck pain.  Skin: Negative for pallor, rash and wound.  Neurological: Negative for seizures, syncope, weakness, numbness and headaches.  Psychiatric/Behavioral: Negative.  Negative for confusion and dysphoric mood.    Objective:    BP (!) 171/87   Pulse 82   Ht 5' 5.5" (1.664 m)   BMI 43.46 kg/m   Wt Readings from Last 3 Encounters:  09/10/19 265 lb 3.2 oz (120.3 kg)  05/07/19 267 lb (121.1 kg)  01/02/19 261 lb (118.4 kg)    Physical Exam  Constitutional: He is oriented to person, place, and time. He appears well-developed. He is cooperative. No distress.  HENT:  Head: Normocephalic and atraumatic.  Eyes: EOM are normal.  Neck: No tracheal deviation present. No thyromegaly present.  Cardiovascular: Normal rate, S1 normal and S2 normal. Exam reveals no gallop.  No murmur heard. Pulses:      Dorsalis pedis pulses are 1+ on the right side and 1+ on the left side.       Posterior tibial pulses are 1+ on the right side and 1+ on the left side.  Pulmonary/Chest: Effort normal. No respiratory distress. He has no wheezes.  Abdominal: He exhibits no distension. There is no abdominal tenderness. There is no guarding and no CVA tenderness.  Musculoskeletal:        General: No edema.     Right shoulder: No swelling or deformity.  Cervical back: Normal range of motion and neck supple.  Neurological: He is alert and oriented to person, place, and time. He has normal strength. No cranial nerve deficit or sensory deficit. Gait normal.  Skin: Skin is warm and dry. No rash noted. No cyanosis. Nails show no clubbing.  Psychiatric: He has a normal mood and affect. His speech is normal. Judgment normal. Cognition and memory are normal.    Results for orders placed or performed in visit on 09/10/19  Microalbumin / creatinine urine ratio  Result Value Ref Range   Creatinine, Urine 232 20 - 320 mg/dL   Microalb, Ur 142.0 mg/dL   Microalb Creat Ratio 612 (H)  <30 mcg/mg creat  T4, free  Result Value Ref Range   Free T4 1.2 0.8 - 1.8 ng/dL  HgB A1c  Result Value Ref Range   Hemoglobin A1C 6.9 (A) 4.0 - 5.6 %   HbA1c POC (<> result, manual entry)     HbA1c, POC (prediabetic range)     HbA1c, POC (controlled diabetic range)     Diabetic Labs (most recent): Lab Results  Component Value Date   HGBA1C 6.9 (A) 09/10/2019   HGBA1C 6.8 (H) 04/30/2019   HGBA1C 8.0 (H) 12/26/2018   Lipid Panel     Component Value Date/Time   CHOL 143 06/20/2017 0727   TRIG 75 06/20/2017 0727   HDL 47 06/20/2017 0727   CHOLHDL 3.0 06/20/2017 0727   VLDL 14 01/19/2016 0719   LDLCALC 80 06/20/2017 0727     Assessment & Plan:   1. 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 These are all discussed in detail with the patient.  -His point-of-care A1c is unchanged at 6.9%, overall improving.   -His  recent labs reviewed, showing stage 2-3 renal insufficiency.  - I have re-counseled the patient on diet management and weight loss  by adopting a carbohydrate restricted / protein rich  Diet.  - he  admits there is a room for improvement in his diet and drink choices. -  Suggestion is made for him to avoid simple carbohydrates  from his diet including Cakes, Sweet Desserts / Pastries, Ice Cream, Soda (diet and regular), Sweet Tea, Candies, Chips, Cookies, Sweet Pastries,  Store Bought Juices, Alcohol in Excess of  1-2 drinks a day, Artificial Sweeteners, Coffee Creamer, and "Sugar-free" Products. This will help patient to have stable blood glucose profile and potentially avoid unintended weight gain.   - Patient is advised to stick to a routine mealtimes to eat 3 meals  a day and avoid unnecessary snacks ( to snack only to correct hypoglycemia).  - I have approached patient with the following individualized plan to  manage diabetes and patient agrees.   -Emphasizing the need for monitoring blood glucose at least twice a day-daily before breakfast and at bedtime, is advised to continue Janumet   25/500 mg p.o. twice daily after breakfast and after supper.    -He is advised to continue glipizide 5 mg p.o. daily at breakfast.   - He promises to do better on his diet and exercise. -He will not tolerate maximum dose of metformin due to CKD. -He will be considered for basal insulin if A1c remains above 8% next visit.  - Patient specific target  for A1c; LDL, HDL, Triglycerides,  were discussed in detail.  2) BP/HTN:  His blood pressure is not controlled, did not take his blood pressure medications this morning.  He has 3 medications for his blood pressure, advised to be consistent with dose and take them in the morning at breakfast.  He is advised to continue  medications including hydrochlorothiazide 25 mg p.o. daily, Procardia 30 mg p.o. daily, ramipril 10 mg p.o. Daily.     3) Lipids/HPL: His recent lipid panel showed controlled LDL at 80.  He is advised to continue atorvastatin 20 mg p.o. nightly.    Side effects and precautions discussed with him.    4)  Weight/Diet: His BMI is 43.4-complicating his diabetes care.  He is a candidate for modest weight loss.  CDE consult in progress, exercise, and carbohydrates information provided.  5) Chronic Care/Health Maintenance:  -Patient is on ACEI/ARB and Statin medications and encouraged to continue to follow up with Ophthalmology, Podiatrist at least yearly or according to recommendations, and advised to stay away from smoking. I have recommended yearly flu vaccine and pneumonia vaccination at least every 5 years; moderate intensity exercise for up to 150 minutes weekly; and  sleep for at least 7 hours a day.  - I advised patient to maintain close follow up with Sharilyn Sites, MD for primary care needs.  - Time spent on this patient care encounter:  35 min, of  which > 50% was spent in  counseling and the rest reviewing his blood glucose logs , discussing his hypoglycemia and hyperglycemia episodes, reviewing his current and  previous labs / studies  ( including abstraction from other facilities) and medications  doses and developing a  long term treatment plan and documenting his care.   Please refer to Patient Instructions for Blood Glucose Monitoring and Insulin/Medications Dosing Guide"  in media tab for additional information. Please  also refer to " Patient Self Inventory" in the Media  tab for reviewed elements of pertinent patient history.  Evalee Jefferson participated in the discussions, expressed understanding, and voiced agreement with the above plans.  All questions were answered to his satisfaction. he is encouraged to contact clinic should he have any questions or concerns prior to his return visit.   Follow up plan: -Return in about 4 months (around 04/11/2020).  Glade Lloyd, MD Phone: [phone]  Fax: [phone]  -  This note was partially dictated with voice recognition software. Similar sounding words can be transcribed inadequately or may not  be corrected upon review.  12/10/2019, 8:25 AM

## 2019-12-10 NOTE — Patient Instructions (Signed)

## 2020-01-18 ENCOUNTER — Other Ambulatory Visit: Payer: Self-pay | Admitting: "Endocrinology

## 2020-04-08 DIAGNOSIS — E1122 Type 2 diabetes mellitus with diabetic chronic kidney disease: Secondary | ICD-10-CM | POA: Diagnosis not present

## 2020-04-08 DIAGNOSIS — N182 Chronic kidney disease, stage 2 (mild): Secondary | ICD-10-CM | POA: Diagnosis not present

## 2020-04-08 DIAGNOSIS — E781 Pure hyperglyceridemia: Secondary | ICD-10-CM | POA: Diagnosis not present

## 2020-04-08 DIAGNOSIS — E1165 Type 2 diabetes mellitus with hyperglycemia: Secondary | ICD-10-CM | POA: Diagnosis not present

## 2020-04-08 DIAGNOSIS — E782 Mixed hyperlipidemia: Secondary | ICD-10-CM | POA: Diagnosis not present

## 2020-04-08 LAB — COMPLETE METABOLIC PANEL WITH GFR
AG Ratio: 1.4 (calc) (ref 1.0–2.5)
ALT: 11 U/L (ref 9–46)
AST: 14 U/L (ref 10–35)
Albumin: 4 g/dL (ref 3.6–5.1)
Alkaline phosphatase (APISO): 61 U/L (ref 35–144)
BUN/Creatinine Ratio: 10 (calc) (ref 6–22)
BUN: 16 mg/dL (ref 7–25)
CO2: 27 mmol/L (ref 20–32)
Calcium: 9 mg/dL (ref 8.6–10.3)
Chloride: 109 mmol/L (ref 98–110)
Creat: 1.55 mg/dL — ABNORMAL HIGH (ref 0.70–1.18)
GFR, Est African American: 49 mL/min/{1.73_m2} — ABNORMAL LOW (ref >=60)
GFR, Est Non African American: 43 mL/min/{1.73_m2} — ABNORMAL LOW (ref >=60)
Globulin: 2.8 g/dL (calc) (ref 1.9–3.7)
Glucose, Bld: 144 mg/dL — ABNORMAL HIGH (ref 65–99)
Potassium: 4.2 mmol/L (ref 3.5–5.3)
Sodium: 143 mmol/L (ref 135–146)
Total Bilirubin: 0.5 mg/dL (ref 0.2–1.2)
Total Protein: 6.8 g/dL (ref 6.1–8.1)

## 2020-04-08 LAB — LIPID PANEL
Cholesterol: 151 mg/dL (ref <200)
HDL: 49 mg/dL (ref >=40)
LDL Cholesterol (Calc): 85 mg/dL (calc)
Non-HDL Cholesterol (Calc): 102 mg/dL (calc) (ref <130)
Total CHOL/HDL Ratio: 3.1 (calc) (ref <5.0)
Triglycerides: 77 mg/dL (ref <150)

## 2020-04-08 LAB — T4, FREE: Free T4: 1.2 ng/dL (ref 0.8–1.8)

## 2020-04-08 LAB — TSH: TSH: 1.31 mIU/L (ref 0.40–4.50)

## 2020-04-14 ENCOUNTER — Ambulatory Visit (INDEPENDENT_AMBULATORY_CARE_PROVIDER_SITE_OTHER): Payer: Medicare HMO | Admitting: "Endocrinology

## 2020-04-14 ENCOUNTER — Encounter: Payer: Self-pay | Admitting: "Endocrinology

## 2020-04-14 ENCOUNTER — Other Ambulatory Visit: Payer: Self-pay

## 2020-04-14 VITALS — BP 144/88 | HR 80 | Ht 65.5 in | Wt 264.6 lb

## 2020-04-14 DIAGNOSIS — N183 Chronic kidney disease, stage 3 unspecified: Secondary | ICD-10-CM

## 2020-04-14 DIAGNOSIS — E782 Mixed hyperlipidemia: Secondary | ICD-10-CM

## 2020-04-14 DIAGNOSIS — IMO0002 Reserved for concepts with insufficient information to code with codable children: Secondary | ICD-10-CM

## 2020-04-14 DIAGNOSIS — E1122 Type 2 diabetes mellitus with diabetic chronic kidney disease: Secondary | ICD-10-CM | POA: Diagnosis not present

## 2020-04-14 DIAGNOSIS — I10 Essential (primary) hypertension: Secondary | ICD-10-CM

## 2020-04-14 DIAGNOSIS — E1165 Type 2 diabetes mellitus with hyperglycemia: Secondary | ICD-10-CM

## 2020-04-14 DIAGNOSIS — E119 Type 2 diabetes mellitus without complications: Secondary | ICD-10-CM | POA: Diagnosis not present

## 2020-04-14 LAB — POCT GLYCOSYLATED HEMOGLOBIN (HGB A1C): Hemoglobin A1C: 6.5 % — AB (ref 4.0–5.6)

## 2020-04-14 NOTE — Progress Notes (Signed)
04/14/2020  Endocrinology follow-up note   Subjective:    Patient ID: Kyle Hicks, male    DOB: Jun 08, 1941, PCP Sharilyn Sites, MD   Past Medical History:  Diagnosis Date  . Diabetes mellitus without complication ([place])   . HTN (hypertension)    Past Surgical History:  Procedure Laterality Date  . ESOPHAGOGASTRODUODENOSCOPY N/A 04/27/2017   Procedure: ESOPHAGOGASTRODUODENOSCOPY (EGD);  Surgeon: Rogene Houston, MD;  Location: [facility];  Service: Endoscopy;  Laterality: N/A;   Social History   Socioeconomic History  . Marital status: Married    Spouse name: Not on file  . Number of children: Not on file  . Years of education: Not on file  . Highest education level: Not on file  Occupational History  . Not on file  Tobacco Use  . Smoking status: Former Smoker    Packs/day: 0.50    Types: Cigarettes    Quit date: 07/26/1987    Years since quitting: 32.7  . Smokeless tobacco: Never Used  Vaping Use  . Vaping Use: Never used  Substance and Sexual Activity  . Alcohol use: No    Alcohol/week: 0.0 standard drinks    Comment: Previously drank socially, last drank 25-30 years ago  . Drug use: No  . Sexual activity: Not on file  Other Topics Concern  . Not on file  Social History Narrative  . Not on file   Social Determinants of Health   Financial Resource Strain:   . Difficulty of Paying Living Expenses: Not on file  Food Insecurity:   . Worried About Charity fundraiser in the Last Year: Not on file  . Ran Out of Food in the Last Year: Not on file  Transportation Needs:   . Lack of Transportation (Medical): Not on file  . Lack of Transportation (Non-Medical): Not on file  Physical Activity:   . Days of Exercise per Week: Not on file  . Minutes of Exercise per Session: Not on file  Stress:   . Feeling of Stress : Not on file  Social Connections:   . Frequency of Communication with Friends and Family: Not on file  . Frequency of Social Gatherings with  Friends and Family: Not on file  . Attends Religious Services: Not on file  . Active Member of Clubs or Organizations: Not on file  . Attends Archivist Meetings: Not on file  . Marital Status: Not on file   Outpatient Encounter Medications as of 04/14/2020  Medication Sig  . atorvastatin (LIPITOR) 20 MG tablet Take 20 mg by mouth daily.  . blood glucose meter kit and supplies KIT Dispense based on patient and insurance preference. Use up to 2 times daily as directed. (FOR ICD-10 E11.65)  . glipiZIDE (GLUCOTROL) 5 MG tablet Take 1 tablet (5 mg total) by mouth daily with breakfast.  . glucose blood (ACCU-CHEK GUIDE) test strip Use as instructed bid E11.65  . hydrochlorothiazide (HYDRODIURIL) 25 MG tablet TAKE 1 TABLET(25 MG) BY MOUTH DAILY  . Lancets (ACCU-CHEK MULTICLIX) lancets Use as instructed bid. E11.65  . NIFEdipine (PROCARDIA-XL/ADALAT-CC/NIFEDICAL-XL) 30 MG 24 hr tablet take 1 tablet by mouth once daily  . ramipril (ALTACE) 10 MG capsule Take 1 capsule by mouth once daily  . sitaGLIPtin-metformin (JANUMET) 50-1000 MG tablet Take 1/2 (one-half) tablet by mouth twice daily   No facility-administered encounter medications on file as of 04/14/2020.   ALLERGIES: No Known Allergies VACCINATION STATUS: Immunization History  Administered Date(s) Administered  . Moderna  SARS-COVID-2 Vaccination 09/23/2019, 10/27/2019    Diabetes He presents for his follow-up diabetic visit. He has type 2 diabetes mellitus. The initial diagnosis of diabetes was made 2 years ago. His disease course has been stable. There are no hypoglycemic associated symptoms. Pertinent negatives for hypoglycemia include no confusion, headaches, pallor or seizures. There are no diabetic associated symptoms. Pertinent negatives for diabetes include no chest pain, no fatigue, no polydipsia, no polyphagia, no polyuria and no weakness. There are no hypoglycemic complications. Symptoms are stable. Diabetic complications  include nephropathy. Risk factors for coronary artery disease include diabetes mellitus, dyslipidemia, hypertension, male sex, obesity, sedentary lifestyle and tobacco exposure. He is compliant with treatment most of the time. His weight is fluctuating minimally. He is following a generally unhealthy diet. He has had a previous visit with a dietitian. He never participates in exercise. His home blood glucose trend is decreasing steadily. (He did not bring his meter to review.  His fasting blood glucose range from 90-150 according to his report.  His point-of-care A1c 6.5% .  He denies hypoglycemia.) An ACE inhibitor/angiotensin II receptor blocker is being taken.  Hypertension This is a chronic problem. The current episode started more than 1 year ago. The problem is uncontrolled. Pertinent negatives include no chest pain, headaches, neck pain, palpitations or shortness of breath. Risk factors for coronary artery disease include diabetes mellitus, dyslipidemia, obesity, sedentary lifestyle and smoking/tobacco exposure. Past treatments include angiotensin blockers.  Hyperlipidemia This is a chronic problem. The current episode started more than 1 year ago. Exacerbating diseases include diabetes and obesity. Pertinent negatives include no chest pain, myalgias or shortness of breath. Current antihyperlipidemic treatment includes statins. Risk factors for coronary artery disease include diabetes mellitus, dyslipidemia, hypertension, family history, male sex, obesity and a sedentary lifestyle.    Review of Systems  Constitutional: Negative for fatigue and unexpected weight change.  HENT: Negative for dental problem, mouth sores and trouble swallowing.   Eyes: Negative for visual disturbance.  Respiratory: Negative for cough, choking, chest tightness, shortness of breath and wheezing.   Cardiovascular: Negative for chest pain, palpitations and leg swelling.  Gastrointestinal: Negative for abdominal  distention, abdominal pain, constipation, diarrhea, nausea and vomiting.  Endocrine: Negative for polydipsia, polyphagia and polyuria.  Genitourinary: Negative for dysuria, flank pain, hematuria and urgency.  Musculoskeletal: Negative for back pain, gait problem, myalgias and neck pain.  Skin: Negative for pallor, rash and wound.  Neurological: Negative for seizures, syncope, weakness, numbness and headaches.  Psychiatric/Behavioral: Negative.  Negative for confusion and dysphoric mood.    Objective:    BP (!) 144/88   Pulse 80   Ht 5' 5.5" (1.664 m)   Wt 264 lb 9.6 oz (120 kg)   BMI 43.36 kg/m   Wt Readings from Last 3 Encounters:  04/14/20 264 lb 9.6 oz (120 kg)  09/10/19 265 lb 3.2 oz (120.3 kg)  05/07/19 267 lb (121.1 kg)    Physical Exam Constitutional:      General: He is not in acute distress.    Appearance: He is well-developed.  HENT:     Head: Normocephalic and atraumatic.  Neck:     Thyroid: No thyromegaly.     Trachea: No tracheal deviation.  Cardiovascular:     Rate and Rhythm: Normal rate.     Pulses:          Dorsalis pedis pulses are 1+ on the right side and 1+ on the left side.       Posterior tibial  pulses are 1+ on the right side and 1+ on the left side.     Heart sounds: S1 normal and S2 normal. No murmur heard.  No gallop.   Pulmonary:     Effort: Pulmonary effort is normal. No respiratory distress.     Breath sounds: No wheezing.  Abdominal:     General: There is no distension.     Tenderness: There is no abdominal tenderness. There is no guarding.  Musculoskeletal:     Right shoulder: No swelling or deformity.     Cervical back: Normal range of motion and neck supple.  Skin:    General: Skin is warm and dry.     Findings: No rash.     Nails: There is no clubbing.  Neurological:     Mental Status: He is alert and oriented to person, place, and time.     Cranial Nerves: No cranial nerve deficit.     Sensory: No sensory deficit.     Gait:  Gait normal.  Psychiatric:        Speech: Speech normal.        Behavior: Behavior is cooperative.        Judgment: Judgment normal.     Results for orders placed or performed in visit on 04/14/20  HgB A1c  Result Value Ref Range   Hemoglobin A1C 6.5 (A) 4.0 - 5.6 %   HbA1c POC (<> result, manual entry)     HbA1c, POC (prediabetic range)     HbA1c, POC (controlled diabetic range)     Diabetic Labs (most recent): Lab Results  Component Value Date   HGBA1C 6.5 (A) 04/14/2020   HGBA1C 6.9 (A) 12/10/2019   HGBA1C 6.9 (A) 09/10/2019   Lipid Panel     Component Value Date/Time   CHOL 151 04/08/2020 0724   TRIG 77 04/08/2020 0724   HDL 49 04/08/2020 0724   CHOLHDL 3.1 04/08/2020 0724   VLDL 14 01/19/2016 0719   LDLCALC 85 04/08/2020 0724     Assessment & Plan:   1. 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 These are all discussed in detail with the patient.  He did not bring his meter to review.  His fasting blood glucose range from 90-150 according to his report.  His point-of-care A1c 6.5% .  He denies hypoglycemia.  -His  recent labs reviewed, showing stage 2-3 renal insufficiency.  - I have re-counseled the patient on diet management and weight loss  by adopting a carbohydrate restricted / protein rich  Diet.  - he  admits there is a room for improvement in his diet and drink choices. -  Suggestion is made for him to avoid simple carbohydrates  from his diet including Cakes, Sweet Desserts / Pastries, Ice Cream, Soda (diet and regular), Sweet Tea, Candies, Chips, Cookies, Sweet Pastries,  Store Bought Juices, Alcohol in Excess of  1-2 drinks a day, Artificial Sweeteners, Coffee Creamer, and "Sugar-free" Products. This will help patient to have stable blood glucose profile and potentially avoid unintended weight  gain.   - Patient is advised to stick to a routine mealtimes to eat 3 meals  a day and avoid unnecessary snacks ( to snack only to correct hypoglycemia).  - I have approached patient with the following individualized plan to manage diabetes and patient agrees.   -Emphasizing the  need for monitoring blood glucose at least twice a day-daily before breakfast and at bedtime, he is advised to continue Janumet  25/500 mg p.o. twice daily after breakfast and after supper.    -He is advised to continue glipizide 5 mg p.o. daily at breakfast.   - He promises to do better on his diet and exercise. -He will not tolerate maximum dose of metformin due to CKD. -He will be considered for basal insulin if A1c remains above 8% next visit.  - Patient specific target  for A1c; LDL, HDL, Triglycerides,  were discussed in detail.  2) BP/HTN: His blood pressure is not controlled to target.  He has 3 medications for his blood pressure, advised to be consistent with dose and take them in the morning at breakfast.  He is advised to continue  medications including hydrochlorothiazide 25 mg p.o. daily, Procardia 30 mg p.o. daily, ramipril 10 mg p.o. Daily.     3) Lipids/HPL: His recent lipid panel showed controlled LDL at 80.  He is advised to continue atorvastatin 20 mg p.o. nightly.   Side effects and precautions discussed with him.    4)  Weight/Diet: His BMI is 94.7 -complicating his diabetes care.  He is a candidate for modest weight loss.  CDE consult in progress, exercise, and carbohydrates information provided.  5) Chronic Care/Health Maintenance:  -Patient is on ACEI/ARB and Statin medications and encouraged to continue to follow up with Ophthalmology, Podiatrist at least yearly or according to recommendations, and advised to stay away from smoking. I have recommended yearly flu vaccine and pneumonia vaccination at least every 5 years; moderate intensity exercise for up to 150 minutes weekly; and  sleep for  at least 7 hours a day.  - I advised patient to maintain close follow up with Sharilyn Sites, MD for primary care needs.  - Time spent on this patient care encounter:  35 min, of which > 50% was spent in  counseling and the rest reviewing his blood glucose logs , discussing his hypoglycemia and hyperglycemia episodes, reviewing his current and  previous labs / studies  ( including abstraction from other facilities) and medications  doses and developing a  long term treatment plan and documenting his care.   Please refer to Patient Instructions for Blood Glucose Monitoring and Insulin/Medications Dosing Guide"  in media tab for additional information. Please  also refer to " Patient Self Inventory" in the Media  tab for reviewed elements of pertinent patient history.  Evalee Jefferson participated in the discussions, expressed understanding, and voiced agreement with the above plans.  All questions were answered to his satisfaction. he is encouraged to contact clinic should he have any questions or concerns prior to his return visit.   Follow up plan: -Return in about 6 months (around 10/12/2020) for F/U with Pre-visit Labs, [place] A1c in Office.  Glade Lloyd, MD Phone: [phone]  Fax: [phone]  -  This note was partially dictated with voice recognition software. Similar sounding words can be transcribed inadequately or may not  be corrected upon review.  04/14/2020, 9:05 AM

## 2020-04-14 NOTE — Patient Instructions (Signed)

## 2020-05-01 DIAGNOSIS — Z23 Encounter for immunization: Secondary | ICD-10-CM | POA: Diagnosis not present

## 2020-07-14 ENCOUNTER — Other Ambulatory Visit: Payer: Self-pay | Admitting: "Endocrinology

## 2020-07-16 ENCOUNTER — Other Ambulatory Visit: Payer: Self-pay | Admitting: "Endocrinology

## 2020-08-18 DIAGNOSIS — R0602 Shortness of breath: Secondary | ICD-10-CM | POA: Diagnosis not present

## 2020-08-18 DIAGNOSIS — R5381 Other malaise: Secondary | ICD-10-CM | POA: Diagnosis not present

## 2020-08-18 DIAGNOSIS — R062 Wheezing: Secondary | ICD-10-CM | POA: Diagnosis not present

## 2020-08-18 DIAGNOSIS — R609 Edema, unspecified: Secondary | ICD-10-CM | POA: Diagnosis not present

## 2020-10-07 ENCOUNTER — Other Ambulatory Visit (HOSPITAL_COMMUNITY): Payer: Medicare HMO

## 2020-10-07 ENCOUNTER — Inpatient Hospital Stay (HOSPITAL_COMMUNITY): Payer: Medicare HMO

## 2020-10-07 ENCOUNTER — Inpatient Hospital Stay (HOSPITAL_COMMUNITY)
Admission: EM | Admit: 2020-10-07 | Discharge: 2020-10-27 | DRG: 286 | Disposition: A | Discharge status: Home Health | Payer: Medicare HMO | Attending: Internal Medicine | Admitting: Internal Medicine

## 2020-10-07 ENCOUNTER — Emergency Department (HOSPITAL_COMMUNITY): Payer: Medicare HMO

## 2020-10-07 DIAGNOSIS — Z8249 Family history of ischemic heart disease and other diseases of the circulatory system: Secondary | ICD-10-CM

## 2020-10-07 DIAGNOSIS — I361 Nonrheumatic tricuspid (valve) insufficiency: Secondary | ICD-10-CM | POA: Diagnosis not present

## 2020-10-07 DIAGNOSIS — I2781 Cor pulmonale (chronic): Secondary | ICD-10-CM | POA: Diagnosis present

## 2020-10-07 DIAGNOSIS — E871 Hypo-osmolality and hyponatremia: Secondary | ICD-10-CM | POA: Diagnosis not present

## 2020-10-07 DIAGNOSIS — R000 Tachycardia, unspecified: Secondary | ICD-10-CM

## 2020-10-07 DIAGNOSIS — Z87891 Personal history of nicotine dependence: Secondary | ICD-10-CM

## 2020-10-07 DIAGNOSIS — R0902 Hypoxemia: Secondary | ICD-10-CM | POA: Diagnosis not present

## 2020-10-07 DIAGNOSIS — I5031 Acute diastolic (congestive) heart failure: Secondary | ICD-10-CM

## 2020-10-07 DIAGNOSIS — I480 Paroxysmal atrial fibrillation: Secondary | ICD-10-CM | POA: Diagnosis present

## 2020-10-07 DIAGNOSIS — I10 Essential (primary) hypertension: Secondary | ICD-10-CM

## 2020-10-07 DIAGNOSIS — E1129 Type 2 diabetes mellitus with other diabetic kidney complication: Secondary | ICD-10-CM | POA: Diagnosis not present

## 2020-10-07 DIAGNOSIS — I878 Other specified disorders of veins: Secondary | ICD-10-CM | POA: Diagnosis present

## 2020-10-07 DIAGNOSIS — I5033 Acute on chronic diastolic (congestive) heart failure: Secondary | ICD-10-CM

## 2020-10-07 DIAGNOSIS — I130 Hypertensive heart and chronic kidney disease with heart failure and stage 1 through stage 4 chronic kidney disease, or unspecified chronic kidney disease: Secondary | ICD-10-CM | POA: Diagnosis not present

## 2020-10-07 DIAGNOSIS — Z6841 Body Mass Index (BMI) 40.0 and over, adult: Secondary | ICD-10-CM | POA: Diagnosis not present

## 2020-10-07 DIAGNOSIS — K7460 Unspecified cirrhosis of liver: Secondary | ICD-10-CM | POA: Diagnosis present

## 2020-10-07 DIAGNOSIS — R197 Diarrhea, unspecified: Secondary | ICD-10-CM | POA: Diagnosis not present

## 2020-10-07 DIAGNOSIS — E669 Obesity, unspecified: Secondary | ICD-10-CM | POA: Diagnosis not present

## 2020-10-07 DIAGNOSIS — I4581 Long QT syndrome: Secondary | ICD-10-CM | POA: Diagnosis present

## 2020-10-07 DIAGNOSIS — N186 End stage renal disease: Secondary | ICD-10-CM | POA: Diagnosis not present

## 2020-10-07 DIAGNOSIS — R778 Other specified abnormalities of plasma proteins: Secondary | ICD-10-CM | POA: Diagnosis not present

## 2020-10-07 DIAGNOSIS — Y92009 Unspecified place in unspecified non-institutional (private) residence as the place of occurrence of the external cause: Secondary | ICD-10-CM

## 2020-10-07 DIAGNOSIS — E11649 Type 2 diabetes mellitus with hypoglycemia without coma: Secondary | ICD-10-CM | POA: Diagnosis not present

## 2020-10-07 DIAGNOSIS — I872 Venous insufficiency (chronic) (peripheral): Secondary | ICD-10-CM | POA: Diagnosis not present

## 2020-10-07 DIAGNOSIS — I4891 Unspecified atrial fibrillation: Secondary | ICD-10-CM | POA: Diagnosis not present

## 2020-10-07 DIAGNOSIS — I5023 Acute on chronic systolic (congestive) heart failure: Secondary | ICD-10-CM | POA: Diagnosis not present

## 2020-10-07 DIAGNOSIS — R34 Anuria and oliguria: Secondary | ICD-10-CM | POA: Diagnosis not present

## 2020-10-07 DIAGNOSIS — I248 Other forms of acute ischemic heart disease: Secondary | ICD-10-CM | POA: Diagnosis not present

## 2020-10-07 DIAGNOSIS — N179 Acute kidney failure, unspecified: Secondary | ICD-10-CM | POA: Diagnosis present

## 2020-10-07 DIAGNOSIS — I4510 Unspecified right bundle-branch block: Secondary | ICD-10-CM | POA: Diagnosis not present

## 2020-10-07 DIAGNOSIS — I5043 Acute on chronic combined systolic (congestive) and diastolic (congestive) heart failure: Secondary | ICD-10-CM | POA: Diagnosis not present

## 2020-10-07 DIAGNOSIS — E785 Hyperlipidemia, unspecified: Secondary | ICD-10-CM | POA: Diagnosis present

## 2020-10-07 DIAGNOSIS — I517 Cardiomegaly: Secondary | ICD-10-CM | POA: Diagnosis not present

## 2020-10-07 DIAGNOSIS — N189 Chronic kidney disease, unspecified: Secondary | ICD-10-CM | POA: Diagnosis not present

## 2020-10-07 DIAGNOSIS — R319 Hematuria, unspecified: Secondary | ICD-10-CM | POA: Diagnosis present

## 2020-10-07 DIAGNOSIS — I132 Hypertensive heart and chronic kidney disease with heart failure and with stage 5 chronic kidney disease, or end stage renal disease: Principal | ICD-10-CM | POA: Diagnosis present

## 2020-10-07 DIAGNOSIS — E876 Hypokalemia: Secondary | ICD-10-CM | POA: Diagnosis not present

## 2020-10-07 DIAGNOSIS — Z8711 Personal history of peptic ulcer disease: Secondary | ICD-10-CM

## 2020-10-07 DIAGNOSIS — E1169 Type 2 diabetes mellitus with other specified complication: Secondary | ICD-10-CM | POA: Diagnosis present

## 2020-10-07 DIAGNOSIS — I5030 Unspecified diastolic (congestive) heart failure: Secondary | ICD-10-CM | POA: Diagnosis not present

## 2020-10-07 DIAGNOSIS — Z992 Dependence on renal dialysis: Secondary | ICD-10-CM

## 2020-10-07 DIAGNOSIS — Z95828 Presence of other vascular implants and grafts: Secondary | ICD-10-CM

## 2020-10-07 DIAGNOSIS — Z20822 Contact with and (suspected) exposure to covid-19: Secondary | ICD-10-CM | POA: Diagnosis not present

## 2020-10-07 DIAGNOSIS — R7989 Other specified abnormal findings of blood chemistry: Secondary | ICD-10-CM | POA: Diagnosis present

## 2020-10-07 DIAGNOSIS — I272 Pulmonary hypertension, unspecified: Secondary | ICD-10-CM | POA: Diagnosis not present

## 2020-10-07 DIAGNOSIS — I081 Rheumatic disorders of both mitral and tricuspid valves: Secondary | ICD-10-CM | POA: Diagnosis present

## 2020-10-07 DIAGNOSIS — I4819 Other persistent atrial fibrillation: Secondary | ICD-10-CM | POA: Diagnosis not present

## 2020-10-07 DIAGNOSIS — Z743 Need for continuous supervision: Secondary | ICD-10-CM | POA: Diagnosis not present

## 2020-10-07 DIAGNOSIS — Z4901 Encounter for fitting and adjustment of extracorporeal dialysis catheter: Secondary | ICD-10-CM | POA: Diagnosis not present

## 2020-10-07 DIAGNOSIS — Z79899 Other long term (current) drug therapy: Secondary | ICD-10-CM

## 2020-10-07 DIAGNOSIS — R609 Edema, unspecified: Secondary | ICD-10-CM | POA: Diagnosis not present

## 2020-10-07 DIAGNOSIS — Z9181 History of falling: Secondary | ICD-10-CM

## 2020-10-07 DIAGNOSIS — I509 Heart failure, unspecified: Secondary | ICD-10-CM | POA: Diagnosis not present

## 2020-10-07 DIAGNOSIS — Z8349 Family history of other endocrine, nutritional and metabolic diseases: Secondary | ICD-10-CM

## 2020-10-07 DIAGNOSIS — W19XXXA Unspecified fall, initial encounter: Secondary | ICD-10-CM

## 2020-10-07 DIAGNOSIS — I484 Atypical atrial flutter: Secondary | ICD-10-CM | POA: Diagnosis not present

## 2020-10-07 DIAGNOSIS — E8770 Fluid overload, unspecified: Secondary | ICD-10-CM | POA: Diagnosis not present

## 2020-10-07 DIAGNOSIS — I340 Nonrheumatic mitral (valve) insufficiency: Secondary | ICD-10-CM | POA: Diagnosis not present

## 2020-10-07 DIAGNOSIS — I5021 Acute systolic (congestive) heart failure: Secondary | ICD-10-CM | POA: Diagnosis not present

## 2020-10-07 DIAGNOSIS — R0602 Shortness of breath: Secondary | ICD-10-CM | POA: Diagnosis not present

## 2020-10-07 DIAGNOSIS — J90 Pleural effusion, not elsewhere classified: Secondary | ICD-10-CM | POA: Diagnosis not present

## 2020-10-07 DIAGNOSIS — D649 Anemia, unspecified: Secondary | ICD-10-CM | POA: Diagnosis not present

## 2020-10-07 DIAGNOSIS — R0689 Other abnormalities of breathing: Secondary | ICD-10-CM | POA: Diagnosis not present

## 2020-10-07 DIAGNOSIS — I2510 Atherosclerotic heart disease of native coronary artery without angina pectoris: Secondary | ICD-10-CM | POA: Diagnosis not present

## 2020-10-07 DIAGNOSIS — I4892 Unspecified atrial flutter: Secondary | ICD-10-CM | POA: Diagnosis not present

## 2020-10-07 DIAGNOSIS — Z8719 Personal history of other diseases of the digestive system: Secondary | ICD-10-CM

## 2020-10-07 DIAGNOSIS — E1122 Type 2 diabetes mellitus with diabetic chronic kidney disease: Secondary | ICD-10-CM | POA: Diagnosis not present

## 2020-10-07 DIAGNOSIS — N1831 Chronic kidney disease, stage 3a: Secondary | ICD-10-CM | POA: Diagnosis not present

## 2020-10-07 DIAGNOSIS — R932 Abnormal findings on diagnostic imaging of liver and biliary tract: Secondary | ICD-10-CM | POA: Diagnosis present

## 2020-10-07 DIAGNOSIS — I5041 Acute combined systolic (congestive) and diastolic (congestive) heart failure: Secondary | ICD-10-CM | POA: Diagnosis not present

## 2020-10-07 DIAGNOSIS — I5082 Biventricular heart failure: Secondary | ICD-10-CM | POA: Diagnosis present

## 2020-10-07 DIAGNOSIS — Z7984 Long term (current) use of oral hypoglycemic drugs: Secondary | ICD-10-CM

## 2020-10-07 DIAGNOSIS — E782 Mixed hyperlipidemia: Secondary | ICD-10-CM | POA: Diagnosis present

## 2020-10-07 DIAGNOSIS — E1165 Type 2 diabetes mellitus with hyperglycemia: Secondary | ICD-10-CM | POA: Diagnosis not present

## 2020-10-07 DIAGNOSIS — N182 Chronic kidney disease, stage 2 (mild): Secondary | ICD-10-CM | POA: Diagnosis not present

## 2020-10-07 LAB — CBC WITH DIFFERENTIAL/PLATELET
Abs Immature Granulocytes: 0.03 10*3/uL (ref 0.00–0.07)
Basophils Absolute: 0 10*3/uL (ref 0.0–0.1)
Basophils Relative: 0 %
Eosinophils Absolute: 0 10*3/uL (ref 0.0–0.5)
Eosinophils Relative: 0 %
HCT: 43.9 % (ref 39.0–52.0)
Hemoglobin: 13.5 g/dL (ref 13.0–17.0)
Immature Granulocytes: 0 %
Lymphocytes Relative: 13 %
Lymphs Abs: 1 10*3/uL (ref 0.7–4.0)
MCH: 28 pg (ref 26.0–34.0)
MCHC: 30.8 g/dL (ref 30.0–36.0)
MCV: 91.1 fL (ref 80.0–100.0)
Monocytes Absolute: 0.5 10*3/uL (ref 0.1–1.0)
Monocytes Relative: 7 %
Neutro Abs: 5.9 10*3/uL (ref 1.7–7.7)
Neutrophils Relative %: 80 %
Platelets: 165 10*3/uL (ref 150–400)
RBC: 4.82 MIL/uL (ref 4.22–5.81)
RDW: 19.3 % — ABNORMAL HIGH (ref 11.5–15.5)
WBC: 7.5 10*3/uL (ref 4.0–10.5)
nRBC: 0 % (ref 0.0–0.2)

## 2020-10-07 LAB — COMPREHENSIVE METABOLIC PANEL
ALT: 26 U/L (ref 0–44)
AST: 29 U/L (ref 15–41)
Albumin: 3.4 g/dL — ABNORMAL LOW (ref 3.5–5.0)
Alkaline Phosphatase: 49 U/L (ref 38–126)
Anion gap: 13 (ref 5–15)
BUN: 47 mg/dL — ABNORMAL HIGH (ref 8–23)
CO2: 13 mmol/L — ABNORMAL LOW (ref 22–32)
Calcium: 8.7 mg/dL — ABNORMAL LOW (ref 8.9–10.3)
Chloride: 113 mmol/L — ABNORMAL HIGH (ref 98–111)
Creatinine, Ser: 2.78 mg/dL — ABNORMAL HIGH (ref 0.61–1.24)
GFR, Estimated: 23 mL/min — ABNORMAL LOW (ref >60)
Glucose, Bld: 73 mg/dL (ref 70–99)
Potassium: 5.1 mmol/L (ref 3.5–5.1)
Sodium: 139 mmol/L (ref 135–145)
Total Bilirubin: 1.4 mg/dL — ABNORMAL HIGH (ref 0.3–1.2)
Total Protein: 7 g/dL (ref 6.5–8.1)

## 2020-10-07 LAB — URINALYSIS, ROUTINE W REFLEX MICROSCOPIC
Bilirubin Urine: NEGATIVE
Glucose, UA: NEGATIVE mg/dL
Ketones, ur: 5 mg/dL — AB
Leukocytes,Ua: NEGATIVE
Nitrite: NEGATIVE
Protein, ur: 30 mg/dL — AB
Specific Gravity, Urine: 1.014 (ref 1.005–1.030)
pH: 6 (ref 5.0–8.0)

## 2020-10-07 LAB — HEMOGLOBIN A1C
Hgb A1c MFr Bld: 5.9 % — ABNORMAL HIGH (ref 4.8–5.6)
Mean Plasma Glucose: 122.63 mg/dL

## 2020-10-07 LAB — CBG MONITORING, ED
Glucose-Capillary: 58 mg/dL — ABNORMAL LOW (ref 70–99)
Glucose-Capillary: 79 mg/dL (ref 70–99)
Glucose-Capillary: 63 mg/dL — ABNORMAL LOW (ref 70–99)
Glucose-Capillary: 86 mg/dL (ref 70–99)

## 2020-10-07 LAB — LACTIC ACID, PLASMA
Lactic Acid, Venous: 2 mmol/L (ref 0.5–1.9)
Lactic Acid, Venous: 1.8 mmol/L (ref 0.5–1.9)

## 2020-10-07 LAB — BASIC METABOLIC PANEL
Anion gap: 11 (ref 5–15)
BUN: 49 mg/dL — ABNORMAL HIGH (ref 8–23)
CO2: 12 mmol/L — ABNORMAL LOW (ref 22–32)
Calcium: 8 mg/dL — ABNORMAL LOW (ref 8.9–10.3)
Chloride: 112 mmol/L — ABNORMAL HIGH (ref 98–111)
Creatinine, Ser: 2.63 mg/dL — ABNORMAL HIGH (ref 0.61–1.24)
GFR, Estimated: 24 mL/min — ABNORMAL LOW (ref >60)
Glucose, Bld: 140 mg/dL — ABNORMAL HIGH (ref 70–99)
Potassium: 5.1 mmol/L (ref 3.5–5.1)
Sodium: 135 mmol/L (ref 135–145)

## 2020-10-07 LAB — RESP PANEL BY RT-PCR (FLU A&B, COVID) ARPGX2
Influenza A by PCR: NEGATIVE
Influenza B by PCR: NEGATIVE
SARS Coronavirus 2 by RT PCR: NEGATIVE

## 2020-10-07 LAB — GLUCOSE, CAPILLARY: Glucose-Capillary: 141 mg/dL — ABNORMAL HIGH (ref 70–99)

## 2020-10-07 LAB — TROPONIN I (HIGH SENSITIVITY)
Troponin I (High Sensitivity): 75 ng/L — ABNORMAL HIGH (ref <18)
Troponin I (High Sensitivity): 81 ng/L — ABNORMAL HIGH (ref <18)

## 2020-10-07 LAB — HEPARIN LEVEL (UNFRACTIONATED): Heparin Unfractionated: 0.19 IU/mL — ABNORMAL LOW (ref 0.30–0.70)

## 2020-10-07 LAB — MAGNESIUM
Magnesium: 1.9 mg/dL (ref 1.7–2.4)
Magnesium: 1.7 mg/dL (ref 1.7–2.4)

## 2020-10-07 LAB — TSH: TSH: 0.72 u[IU]/mL (ref 0.350–4.500)

## 2020-10-07 LAB — BRAIN NATRIURETIC PEPTIDE: B Natriuretic Peptide: 4299 pg/mL — ABNORMAL HIGH (ref 0.0–100.0)

## 2020-10-07 LAB — D-DIMER, QUANTITATIVE: D-Dimer, Quant: 4.72 ug/mL-FEU — ABNORMAL HIGH (ref 0.00–0.50)

## 2020-10-07 MED ORDER — ADENOSINE 6 MG/2ML IV SOLN
INTRAVENOUS | Status: AC
  Administered 2020-10-07: 12 mg via INTRAVENOUS
  Filled 2020-10-07: qty 4

## 2020-10-07 MED ORDER — ACETAMINOPHEN 325 MG PO TABS: 650.0000 mg | ORAL_TABLET | ORAL | Status: DC | PRN

## 2020-10-07 MED ORDER — AMIODARONE HCL IN DEXTROSE 360-4.14 MG/200ML-% IV SOLN
60.0000 mg/h | INTRAVENOUS | Status: DC
  Administered 2020-10-07 (×2): 60 mg/h via INTRAVENOUS
  Filled 2020-10-07 (×2): qty 200

## 2020-10-07 MED ORDER — DEXTROSE 50 % IV SOLN: 1.0000 | INTRAVENOUS | Status: DC | PRN

## 2020-10-07 MED ORDER — INSULIN ASPART 100 UNIT/ML ~~LOC~~ SOLN
0.0000 [IU] | SUBCUTANEOUS | Status: DC
  Administered 2020-10-09: 1 [IU] via SUBCUTANEOUS
  Administered 2020-10-10: 2 [IU] via SUBCUTANEOUS
  Administered 2020-10-10: 1 [IU] via SUBCUTANEOUS
  Administered 2020-10-13: 0 [IU] via SUBCUTANEOUS
  Administered 2020-10-14 – 2020-10-18 (×5): 1 [IU] via SUBCUTANEOUS
  Administered 2020-10-19: 2 [IU] via SUBCUTANEOUS
  Administered 2020-10-22 – 2020-10-26 (×3): 1 [IU] via SUBCUTANEOUS

## 2020-10-07 MED ORDER — SODIUM CHLORIDE 0.9 % IV SOLN: 250.0000 mL | INTRAVENOUS | Status: DC | PRN

## 2020-10-07 MED ORDER — ADENOSINE 6 MG/2ML IV SOLN
12.0000 mg | Freq: Once | INTRAVENOUS | Status: AC
  Administered 2020-10-07: 12 mg via INTRAVENOUS
  Filled 2020-10-07: qty 4

## 2020-10-07 MED ORDER — METOPROLOL TARTRATE 5 MG/5ML IV SOLN
5.0000 mg | Freq: Once | INTRAVENOUS | Status: AC
  Administered 2020-10-07: 5 mg via INTRAVENOUS
  Filled 2020-10-07: qty 5

## 2020-10-07 MED ORDER — AMIODARONE LOAD VIA INFUSION
150.0000 mg | Freq: Once | INTRAVENOUS | Status: AC
  Administered 2020-10-07: 150 mg via INTRAVENOUS
  Filled 2020-10-07: qty 83.34

## 2020-10-07 MED ORDER — AMIODARONE HCL IN DEXTROSE 360-4.14 MG/200ML-% IV SOLN
30.0000 mg/h | INTRAVENOUS | Status: DC
  Administered 2020-10-08 (×2): 30 mg/h via INTRAVENOUS
  Administered 2020-10-09 – 2020-10-18 (×36): 60 mg/h via INTRAVENOUS
  Administered 2020-10-19: 30 mg/h via INTRAVENOUS
  Administered 2020-10-19 (×2): 60 mg/h via INTRAVENOUS
  Administered 2020-10-20 – 2020-10-22 (×5): 30 mg/h via INTRAVENOUS
  Filled 2020-10-07 (×45): qty 200

## 2020-10-07 MED ORDER — ALBUTEROL SULFATE (2.5 MG/3ML) 0.083% IN NEBU: 2.5000 mg | INHALATION_SOLUTION | Freq: Four times a day (QID) | RESPIRATORY_TRACT | Status: DC | PRN

## 2020-10-07 MED ORDER — MAGNESIUM SULFATE IN D5W 1-5 GM/100ML-% IV SOLN
1.0000 g | Freq: Once | INTRAVENOUS | Status: AC
  Administered 2020-10-07: 1 g via INTRAVENOUS
  Filled 2020-10-07: qty 100

## 2020-10-07 MED ORDER — HEPARIN BOLUS VIA INFUSION
4000.0000 [IU] | Freq: Once | INTRAVENOUS | Status: AC
  Administered 2020-10-07: 4000 [IU] via INTRAVENOUS
  Filled 2020-10-07: qty 4000

## 2020-10-07 MED ORDER — TECHNETIUM TO 99M ALBUMIN AGGREGATED
4.0000 | Freq: Once | INTRAVENOUS | Status: AC | PRN
  Administered 2020-10-07: 4 via INTRAVENOUS

## 2020-10-07 MED ORDER — HEPARIN (PORCINE) 25000 UT/250ML-% IV SOLN
1600.0000 [IU]/h | INTRAVENOUS | Status: DC
  Administered 2020-10-07: 1400 [IU]/h via INTRAVENOUS
  Administered 2020-10-07: 1200 [IU]/h via INTRAVENOUS
  Administered 2020-10-08: 1600 [IU]/h via INTRAVENOUS
  Filled 2020-10-07 (×3): qty 250

## 2020-10-07 MED ORDER — HEPARIN BOLUS VIA INFUSION
1000.0000 [IU] | Freq: Once | INTRAVENOUS | Status: AC
  Administered 2020-10-07: 1000 [IU] via INTRAVENOUS
  Filled 2020-10-07: qty 1000

## 2020-10-07 MED ORDER — SODIUM CHLORIDE 0.9% FLUSH
3.0000 mL | INTRAVENOUS | Status: DC | PRN
  Administered 2020-10-15: 3 mL via INTRAVENOUS

## 2020-10-07 MED ORDER — FUROSEMIDE 10 MG/ML IJ SOLN
40.0000 mg | Freq: Two times a day (BID) | INTRAMUSCULAR | Status: DC
  Administered 2020-10-07 – 2020-10-08 (×2): 40 mg via INTRAVENOUS
  Filled 2020-10-07 (×2): qty 4

## 2020-10-07 MED ORDER — SODIUM CHLORIDE 0.9% FLUSH
3.0000 mL | Freq: Two times a day (BID) | INTRAVENOUS | Status: DC
  Administered 2020-10-07 – 2020-10-18 (×15): 3 mL via INTRAVENOUS

## 2020-10-07 MED ORDER — FUROSEMIDE 10 MG/ML IJ SOLN
40.0000 mg | Freq: Once | INTRAMUSCULAR | Status: AC
  Administered 2020-10-07: 40 mg via INTRAVENOUS
  Filled 2020-10-07: qty 4

## 2020-10-07 MED ORDER — ONDANSETRON HCL 4 MG/2ML IJ SOLN: 4.0000 mg | Freq: Four times a day (QID) | INTRAMUSCULAR | Status: DC | PRN

## 2020-10-07 MED ORDER — NIFEDIPINE ER OSMOTIC RELEASE 30 MG PO TB24
30.0000 mg | ORAL_TABLET | Freq: Every day | ORAL | Status: DC
  Filled 2020-10-07: qty 1

## 2020-10-07 MED ORDER — ADENOSINE 6 MG/2ML IV SOLN: 12.0000 mg | Freq: Once | INTRAVENOUS | Status: AC

## 2020-10-07 MED ORDER — ATORVASTATIN CALCIUM 10 MG PO TABS
20.0000 mg | ORAL_TABLET | Freq: Every day | ORAL | Status: DC
  Administered 2020-10-07 – 2020-10-27 (×21): 20 mg via ORAL
  Filled 2020-10-07 (×21): qty 2

## 2020-10-07 NOTE — Progress Notes (Addendum)
HOSPITAL MEDICINE OVERNIGHT EVENT NOTE    Notified by nursing that patient continues to be in likely rapid atrial flutter despite amiodarone infusion, rate approximately 130.  Patient is hemodynamically stable.  Patient denies chest pain.  Patient is not in respiratory distress.  Based on review of notes patient is also felt to be in cardiogenic volume overload.  Lasix is already been administered although considering the elevated creatinine the dose of 40 mg IV may not be adequate.  As we await for clinical response to Lasix therapy, rechecking electrolytes including BMP and magnesium.    Kyle Emerald  MD [facility]   ADDENDUM:  Magnesium found to be 1.7 and therefore will provide 1 g of intravenous magnesium sulfate.  Continue to monitor closely on telemetry. If rhythm/rate doesn't abate througout the evening then the likely next step will be EP consultation in the AM on day shift.   [name]

## 2020-10-07 NOTE — H&P (Addendum)
History and Physical    CAHLIL SATTAR SXQ:[id number] DOB: 07-Oct-1940 DOA: 10/07/2020  Referring MD/NP/PA: Gean Birchwood, MD PCP: Sharilyn Sites, MD  Patient coming from: Home via EMS  Chief Complaint: Fall  I have personally briefly reviewed patient's old medical records in [facility]   HPI: DERAY DAWES is a 80 y.o. 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  He reports that he had not been feeling well for the last 2 days.  This morning when he got up to use the restroom he reports that his left leg got weak and he fell to his side off the couch.  He did not hit his head or lose consciousness.  Denies any change in speech, vision, sensation.  He feels globally weak.  Noted associated symptoms of diarrhea, possibly some weight loss, leg swelling, shortness of breath, intermittent wheezing, and palpitations.  Patient has not noticed any blood or dark stools, but he had been getting up to have a stool every 25-30 minutes recently.  Denies having any significant cough, calf/leg pain, or orthopnea symptoms.  Wife notes that he had been very weak yesterday and stayed in the bathroom all day.  He had not been eating well and had been drinking Ensure recently.   ED Course: Upon admission into the emergency department patient was seen to be afebrile, pulse 129-134, respirations 19-43, blood pressure 105/73-138/108, and O2 saturations maintained on room air.  Patient was placed on 2 L nasal cannula oxygen for respiratory distress.  Labs significant for CO2 13, BUN 47, creatinine 2.78, glucose 73, total bilirubin 1.4, BNP 4299, troponin 75-> 81, D-dimer 4.72, and lactic acid 2->1.8.  Influenza and COVID-19 screening were negative.  Chest x-ray significant for enlarged cardiac silhouette with a small right-sided pleural  effusion.  Patient had been given metoprolol 5 mg IV, furosemide 40 mg IV, adenosine 12 g twice, started on heparin drip  Review of Systems  Constitutional: Positive for malaise/fatigue. Negative for fever.  HENT: Positive for hearing loss. Negative for congestion.   Eyes: Negative for double vision and photophobia.  Respiratory: Positive for shortness of breath and wheezing. Negative for cough.   Cardiovascular: Positive for leg swelling. Negative for chest pain.  Gastrointestinal: Positive for diarrhea. Negative for abdominal pain, nausea and vomiting.  Genitourinary: Negative for dysuria and hematuria.  Musculoskeletal: Positive for falls and joint pain.  Skin: Negative for rash.  Neurological: Positive for weakness. Negative for sensory change, speech change and focal weakness.  Psychiatric/Behavioral: Negative for memory loss and substance abuse.    Past Medical History:  Diagnosis Date  . Diabetes mellitus without complication ([place])   . HTN (hypertension)     Past Surgical History:  Procedure Laterality Date  . ESOPHAGOGASTRODUODENOSCOPY N/A 04/27/2017   Procedure: ESOPHAGOGASTRODUODENOSCOPY (EGD);  Surgeon: Rogene Houston, MD;  Location: [facility];  Service: Endoscopy;  Laterality: N/A;     reports that he quit smoking about 33 years ago. His smoking use included cigarettes. He smoked 0.50 packs per day. He has never used smokeless tobacco. He reports that he does not drink alcohol and does not use drugs.  No Known Allergies  Family History  Problem Relation Age of Onset  . Hypertension Mother   . Hyperlipidemia Mother   . Hypertension Father   . Hyperlipidemia Father   .  Colon cancer Neg Hx     Prior to Admission medications   Medication Sig Start Date End Date Taking? Authorizing Provider  atorvastatin (LIPITOR) 20 MG tablet Take 20 mg by mouth daily.    [provider]  blood glucose meter kit and supplies KIT Dispense based on patient and insurance  preference. Use up to 2 times daily as directed. (FOR ICD-10 E11.65) 04/12/18   Cassandria Anger, MD  glipiZIDE (GLUCOTROL) 5 MG tablet Take 1 tablet by mouth once daily with breakfast 07/16/20   Cassandria Anger, MD  glucose blood (ACCU-CHEK GUIDE) test strip Use as instructed bid E11.65 02/17/18   Cassandria Anger, MD  hydrochlorothiazide (HYDRODIURIL) 25 MG tablet TAKE 1 TABLET(25 MG) BY MOUTH DAILY 06/16/18   Nida, Marella Chimes, MD  Lancets (ACCU-CHEK MULTICLIX) lancets Use as instructed bid. E11.65 02/17/18   Cassandria Anger, MD  NIFEdipine (PROCARDIA-XL/ADALAT-CC/NIFEDICAL-XL) 30 MG 24 hr tablet take 1 tablet by mouth once daily 02/14/17   Cassandria Anger, MD  ramipril (ALTACE) 10 MG capsule Take 1 capsule by mouth once daily 10/09/19   Cassandria Anger, MD  sitaGLIPtin-metformin (JANUMET) 50-1000 MG tablet Take 1/2 (one-half) tablet by mouth twice daily 12/10/19   Cassandria Anger, MD    Physical Exam:  Constitutional: Elderly male who appears to be no acute distress Vitals:   10/07/20 0540 10/07/20 0600 10/07/20 0630 10/07/20 0700  BP: 122/79 112/85 106/77 107/77  Pulse: (!) 132 (!) 131 (!) 133 (!) 132  Resp: (!) 31 (!) 31 (!) 35 (!) 30  Temp:      TempSrc:      SpO2: 99% 98% 99% 100%  Weight:      Height:       Eyes: PERRL, lids and conjunctivae normal ENMT: Mucous membranes are dry. Posterior pharynx clear of any exudate or lesions.  Neck: Supple. positive JVD Respiratory:Mildly tachypneic with decreased air rate.  No significant wheezes or crackles appreciated.  Patient on 2 L of oxygen with O2 saturations currently maintained. Cardiovascular: Tachycardic.  +2 pitting lower extremity edema appreciated. Abdomen: no tenderness, no masses palpated. No hepatosplenomegaly. Bowel sounds positive.  Musculoskeletal: no clubbing / cyanosis.  Moderate crepitation noted of the left knee. Skin: Venous stasis dermatitis changes of the lower  extremities Neurologic: CN 2-12 grossly intact. Sensation intact, DTR normal. Strength 5/5 in all 4.  Psychiatric: Normal judgment and insight. Alert and oriented x 3. Normal mood.     Labs on Admission: I have personally reviewed following labs and imaging studies  CBC: Recent Labs  Lab 10/07/20 0229  WBC 7.5  NEUTROABS 5.9  HGB 13.5  HCT 43.9  MCV 91.1  PLT 440   Basic Metabolic Panel: Recent Labs  Lab 10/07/20 0229  NA 139  K 5.1  CL 113*  CO2 13*  GLUCOSE 73  BUN 47*  CREATININE 2.78*  CALCIUM 8.7*   GFR: Estimated Creatinine Clearance: 26.1 mL/min (A) (by C-G formula based on SCr of 2.78 mg/dL (H)). Liver Function Tests: Recent Labs  Lab 10/07/20 0229  AST 29  ALT 26  ALKPHOS 49  BILITOT 1.4*  PROT 7.0  ALBUMIN 3.4*   No results for input(s): LIPASE, AMYLASE in the last 168 hours. No results for input(s): AMMONIA in the last 168 hours. Coagulation Profile: No results for input(s): INR, PROTIME in the last 168 hours. Cardiac Enzymes: No results for input(s): CKTOTAL, CKMB, CKMBINDEX, TROPONINI in the last 168 hours. BNP (last 3 results) No  results for input(s): PROBNP in the last 8760 hours. HbA1C: No results for input(s): HGBA1C in the last 72 hours. CBG: No results for input(s): GLUCAP in the last 168 hours. Lipid Profile: No results for input(s): CHOL, HDL, LDLCALC, TRIG, CHOLHDL, LDLDIRECT in the last 72 hours. Thyroid Function Tests: No results for input(s): TSH, T4TOTAL, FREET4, T3FREE, THYROIDAB in the last 72 hours. Anemia Panel: No results for input(s): VITAMINB12, FOLATE, FERRITIN, TIBC, IRON, RETICCTPCT in the last 72 hours. Urine analysis:    Component Value Date/Time   COLORURINE YELLOW 04/24/2017 1900   APPEARANCEUR CLEAR 04/24/2017 1900   LABSPEC 1.023 04/24/2017 1900   PHURINE 5.0 04/24/2017 1900   GLUCOSEU NEGATIVE 04/24/2017 1900   HGBUR SMALL (A) 04/24/2017 1900   BILIRUBINUR NEGATIVE 04/24/2017 1900   KETONESUR NEGATIVE  04/24/2017 1900   PROTEINUR NEGATIVE 04/24/2017 1900   UROBILINOGEN 0.2 02/23/2013 1151   NITRITE NEGATIVE 04/24/2017 1900   LEUKOCYTESUR NEGATIVE 04/24/2017 1900   Sepsis Labs: Recent Results (from the past 240 hour(s))  Resp Panel by RT-PCR (Flu A&B, Covid) Nasopharyngeal Swab     Status: None   Collection Time: 10/07/20  3:07 AM   Specimen: Nasopharyngeal Swab; Nasopharyngeal(NP) swabs in vial transport medium  Result Value Ref Range Status   SARS Coronavirus 2 by RT PCR NEGATIVE NEGATIVE Final    Comment: (NOTE) SARS-CoV-2 target nucleic acids are NOT DETECTED.  The SARS-CoV-2 RNA is generally detectable in upper respiratory specimens during the acute phase of infection. The lowest concentration of SARS-CoV-2 viral copies this assay can detect is 138 copies/mL. A negative result does not preclude SARS-Cov-2 infection and should not be used as the sole basis for treatment or other patient management decisions. A negative result may occur with  improper specimen collection/handling, submission of specimen other than nasopharyngeal swab, presence of viral mutation(s) within the areas targeted by this assay, and inadequate number of viral copies(<138 copies/mL). A negative result must be combined with clinical observations, patient history, and epidemiological information. The expected result is Negative.  Fact Sheet for Patients:  EntrepreneurPulse.com.au  Fact Sheet for Healthcare Providers:  IncredibleEmployment.be  This test is no t yet approved or cleared by the [place] FDA and  has been authorized for detection and/or diagnosis of SARS-CoV-2 by FDA under an Emergency Use Authorization (EUA). This EUA will remain  in effect (meaning this test can be used) for the duration of the COVID-19 declaration under Section 564(b)(1) of the Act, 21 U.S.C.section 360bbb-3(b)(1), unless the authorization is terminated  or revoked sooner.        Influenza A by PCR NEGATIVE NEGATIVE Final   Influenza B by PCR NEGATIVE NEGATIVE Final    Comment: (NOTE) The Xpert Xpress SARS-CoV-2/FLU/RSV plus assay is intended as an aid in the diagnosis of influenza from Nasopharyngeal swab specimens and should not be used as a sole basis for treatment. Nasal washings and aspirates are unacceptable for Xpert Xpress SARS-CoV-2/FLU/RSV testing.  Fact Sheet for Patients: EntrepreneurPulse.com.au  Fact Sheet for Healthcare Providers: IncredibleEmployment.be  This test is not yet approved or cleared by the [place] FDA and has been authorized for detection and/or diagnosis of SARS-CoV-2 by FDA under an Emergency Use Authorization (EUA). This EUA will remain in effect (meaning this test can be used) for the duration of the COVID-19 declaration under Section 564(b)(1) of the Act, 21 U.S.C. section 360bbb-3(b)(1), unless the authorization is terminated or revoked.  Performed at [facility], [address] [place], [place] [address]  Radiological Exams on Admission: DG Chest Portable 1 View  Result Date: 10/07/2020 CLINICAL DATA:  [name], short of breath, tachycardia EXAM: PORTABLE CHEST 1 VIEW COMPARISON:  02/23/2013 FINDINGS: Single frontal view of the chest demonstrates an enlarged cardiac silhouette. Small right pleural effusion obscures the costophrenic angle. No airspace disease or pneumothorax. No acute bony abnormalities. IMPRESSION: 1. Enlarged cardiac silhouette. 2. Small right pleural effusion. Electronically Signed   By: Randa Ngo M.D.   On: 10/07/2020 02:53    EKG: Independently reviewed.  Sinus tachycardia 135 bpm with right bundle branch block and QTc 516  Assessment/Plan Acute on chronic diastolic congestive heart failure exacerbation: Patient noted to have lower extremity swelling on physical exam.  Chest x-ray significant for enlarged cardiac silhouette with small  right-sided pleural effusion.  BNP elevated at 4299.  Patient was given 40 mg of Lasix IV. -Admit to a telemetry bed -Heart failure orders set  initiated  -Continuous pulse oximetry with nasal cannula oxygen as needed to keep O2 saturations >92% -Strict I&Os and daily weights -Elevate lower extremities -And on TSH -Lasix 40 mg IV bid -Reassess in a.m. and adjust diuresis as needed. -Check echocardiogram -No ACE inhibitor due to renal insufficiency -No beta-blocker due to acute decompensation -Appreciate cardiology consultative services, we will follow-up for further recommendations  Elevated D-dimer: Acute.  On admission D-dimer elevated at 4.72.  Unable to obtain a CT angiogram of the chest with contrast due to kidney function. -Heparin drip per pharmacy -Check VQ scan once able  Acute kidney injury superimposed on chronic kidney disease stage IIIb: Patient presents with creatinine elevated up to 2.78 with BUN 47.  Baseline creatinine previously have been around 1.5.  Suspect hypoperfusion due to tachycardia and heart failure exacerbation. -Continue to monitor kidney function with diuresis -Hold nephrotoxic agents   Tachycardia: Acute.  Heart rate is noted to be in the 130s.  Patient had been given Toprol IV and adenosine twice while in the emergency department without improvement in symptoms.  Question possibility of atrial flutter. -Per cardiology  Fall and generalized weakness: Patient presents after having a fall at home.  He reportedly has been having diarrhea all day yesterday which may be a contributing factor to his weakness. -PT to eval and treat in a.m  Diabetes mellitus type 2, hypoglycemia: Acute.  On admission glucose 63.  Last hemoglobin A1c 6.5 in 03/2020.  Home medications include glipizide 5 mg daily, and Janumet 25- 500 mg tablet twice daily. -Hypoglycemic protocols -Hold glipizide -CBGs every 4 hours with very sensitive SSI  Diarrhea: Acute.  Patient has not  recently been on antibiotics, but reportedly has intermittent episodes of diarrhea. -Monitor intake and output -Consider checking C. difficile if diarrhea persist  Essential hypertension: On admission blood pressures were initially noted to be soft as 91/63. -Discontinued nifedipine per cardiology Hyperlipidemia -Continue atorvastatin 20 mg nightly  Prolonged QT interval: QTC 516  -Avoid QT prolonging medications  Obesity  DVT prophylaxis: Heparin Code Status: Full Family Communication: Wife updated over the phone Disposition Plan: To be determined Consults called: Cardiology Admission status: Inpatient, require more than 2 midnight stay  Norval Morton MD [facility]   If 7PM-7AM, please contact night-coverage   10/07/2020, 7:44 AM

## 2020-10-07 NOTE — ED Triage Notes (Signed)
Pt bib ems after pts wife called because pt slid/fell getting up from the couch. Denies hitting head or LOC. Wife reports to ems that the generalized weakness has been going on since November but has progressively gotten worse.  Ems reports abnormal EKG with possible right bundle branch block. Pt denies any cardiac Hx.   HR: 133  BP: 114/82  Spo2: 99% RA CBG: 93

## 2020-10-07 NOTE — ED Notes (Signed)
MD paged regarding HR ranging from 125-136bpm. Pt denies chest pain or any other discomfort. Continuous cardiac monitoring in place.

## 2020-10-07 NOTE — Consult Note (Addendum)
Cardiology Consultation:   Patient ID: DOYEL TALBOT MRN: [id number]; DOB: August 05, 1940  Admit date: 10/07/2020 Date of Consult: 10/07/2020  PCP:  Sharilyn Sites, [place]  Cardiologist: New   Patient Profile:   Kyle Hicks is a 80 y.o. male with a hx of hypertension, diabetes mellitus, chronic diastolic dysfunction, peptic ulcer disease and prior GI bleed who is being seen today for the evaluation of tachycardia at the request of Dr. Tamala Julian.  No prior cardiac history. Echocardiogram October 2018 showed estimated EF of 55%, no wall motion abnormality, grade 1 diastolic dysfunction and mild aortic regurgitation.  This was a technically difficult study.  History of Present Illness:   Mr. State is a poor historian.  History obtained from reviewing chart.  Patient is not feeling well for past few days.  This morning when he woke up to use bathroom he felt weak on his leg and slide off the couch.  No loss of consciousness.  No associated chest pain, orthopnea, PND, syncope or melena.  Diarrhea since yesterday.  EMS found patient tachycardic and brought to ER for further evaluation.  Patient received adenosine 12 mg x 2.  No event recorded on monitor or EKG strip at bedside.  BNP 4299.  High-sensitivity troponin 75>> 81. D-dimer elevated.  VQ scan negative for pulmonary embolism.  Chest x-ray with right-sided pleural effusion.  Received IV Lasix 40 mg x 1.  He received IV metoprolol 5 mg x 1 and now on p.o. nifedipine 30 mg daily.  Blood pressure soft. Started on IV heparin.   Currently denies chest pain, shortness of breath or palpitation.  He is on supplemental oxygen.  Heart rate in the 130s.  TSH normla Hgb A1c 5.9 Lactic acid 2.0>>1.8 Scr 2.78   Past Medical History:  Diagnosis Date  . Diabetes mellitus without complication ([place])   . HTN (hypertension)     Past Surgical History:  Procedure Laterality Date  . ESOPHAGOGASTRODUODENOSCOPY N/A 04/27/2017    Procedure: ESOPHAGOGASTRODUODENOSCOPY (EGD);  Surgeon: Rogene Houston, MD;  Location: [facility];  Service: Endoscopy;  Laterality: N/A;    Inpatient Medications: Scheduled Meds: . atorvastatin  20 mg Oral Daily  . furosemide  40 mg Intravenous BID  . insulin aspart  0-6 Units Subcutaneous Q4H  . NIFEdipine  30 mg Oral Daily  . sodium chloride flush  3 mL Intravenous Q12H   Continuous Infusions: . sodium chloride    . heparin 1,200 Units/hr (10/07/20 0453)   PRN Meds: sodium chloride, acetaminophen, albuterol, dextrose, ondansetron (ZOFRAN) IV, sodium chloride flush  Allergies:   No Known Allergies  Social History:   Social History   Socioeconomic History  . Marital status: Married    Spouse name: Not on file  . Number of children: Not on file  . Years of education: Not on file  . Highest education level: Not on file  Occupational History  . Not on file  Tobacco Use  . Smoking status: Former Smoker    Packs/day: 0.50    Types: Cigarettes    Quit date: 07/26/1987    Years since quitting: 33.2  . Smokeless tobacco: Never Used  Vaping Use  . Vaping Use: Never used  Substance and Sexual Activity  . Alcohol use: No    Alcohol/week: 0.0 standard drinks    Comment: Previously drank socially, last drank 25-30 years ago  . Drug use: No  . Sexual activity: Not on file  Other Topics  Concern  . Not on file  Social History Narrative  . Not on file   Social Determinants of Health   Financial Resource Strain: Not on file  Food Insecurity: Not on file  Transportation Needs: Not on file  Physical Activity: Not on file  Stress: Not on file  Social Connections: Not on file  Intimate Partner Violence: Not on file    Family History:   Family History  Problem Relation Age of Onset  . Hypertension Mother   . Hyperlipidemia Mother   . Hypertension Father   . Hyperlipidemia Father   . Colon cancer Neg Hx      ROS:  Please see the history of present illness. All  other ROS reviewed and negative.     Physical Exam/Data:   Vitals:   10/07/20 1400 10/07/20 1430 10/07/20 1500 10/07/20 1543  BP: 101/65 91/63 109/80 95/65  Pulse: (!) 135 (!) 136 (!) 135 (!) 136  Resp: '19 19 17 20  '$ Temp:    98.5 F (36.9 C)  TempSrc:    Oral  SpO2: 100% 100% 100% 100%  Weight:      Height:       No intake or output data in the 24 hours ending 10/07/20 1556 Last 3 Weights 10/07/2020 04/14/2020 09/10/2019  Weight (lbs) 246 lb 264 lb 9.6 oz 265 lb 3.2 oz  Weight (kg) 111.585 kg 120.022 kg 120.294 kg     Body mass index is 39.71 kg/m.  General:  Well nourished, well developed, in no acute distress although he says he is feeling okay, he does seem to have increased work of breathing. HEENT: normal Lymph: no adenopathy Neck: JVD difficult to evaluate due to body habitus, but appears to be at the angle of the jaw. Endocrine:  No thryomegaly Vascular: No carotid bruits; FA pulses 2+ bilaterally without bruits  Cardiac:  normal S1, S2; regular tachycardic; no murmur  Lungs: Diminished breath sounds -> poor effort, but there does appear to be basal crackles/rales. abd: soft, nontender, no hepatomegaly  Ext: Venous stasis on left lower extremity, right lower extremity with 2+  edema Musculoskeletal:  No deformities, BUE and BLE strength normal and equal Skin: warm and dry  Neuro:  CNs 2-12 intact, no focal abnormalities noted Psych:  Normal affect   EKG:  The EKG was personally reviewed and demonstrates: EKG read as sinus tachycardia concerning for a flutter, right bundle blanch block Telemetry:  Telemetry was personally reviewed and demonstrates; tachycardia at rate of 130 bpm, PVC, underlying rhythm likely sinus tach versus a flutter  Relevant CV Studies:  Echo 04/2017: Poor quality imaging.  Poor sound wave transmission through body habitus.  Definity contrast used.  Mild LVH with EF estimated 55%.  Normal wall motion.  GR 1 DD with mild AI.   Laboratory  Data:  High Sensitivity Troponin:   Recent Labs  Lab 10/07/20 0229 10/07/20 0429  TROPONINIHS 75* 81*     Chemistry Recent Labs  Lab 10/07/20 0229  NA 139  K 5.1  CL 113*  CO2 13*  GLUCOSE 73  BUN 47*  CREATININE 2.78*  CALCIUM 8.7*  GFRNONAA 23*  ANIONGAP 13    Recent Labs  Lab 10/07/20 0229  PROT 7.0  ALBUMIN 3.4*  AST 29  ALT 26  ALKPHOS 49  BILITOT 1.4*   Hematology Recent Labs  Lab 10/07/20 0229  WBC 7.5  RBC 4.82  HGB 13.5  HCT 43.9  MCV 91.1  MCH 28.0  MCHC  30.8  RDW 19.3*  PLT 165   BNP Recent Labs  Lab 10/07/20 0229  BNP 4,299.0*    DDimer  Recent Labs  Lab 10/07/20 0229  DDIMER 4.72*     Radiology/Studies:  NM Pulmonary Perfusion  Result Date: 10/07/2020 CLINICAL DATA:  Shortness of breath. EXAM: NUCLEAR MEDICINE PERFUSION LUNG SCAN TECHNIQUE: Perfusion images were obtained in multiple projections after intravenous injection of radiopharmaceutical. Views: Anterior, posterior, left lateral, right lateral, RPO, LPO, RAO, LAO. RADIOPHARMACEUTICALS:  4.0 mCi Tc-45mMAA IV COMPARISON:  Chest radiograph October 07, 2020 FINDINGS: Radiotracer uptake is homogeneous and symmetric bilaterally. No perfusion defects evident. IMPRESSION: No perfusion defects evident. No findings indicative of pulmonary embolus. Electronically Signed   By: WLowella GripIII M.D.   On: 10/07/2020 11:02   DG Chest Portable 1 View  Result Date: 10/07/2020 CLINICAL DATA:  [place] short of breath, tachycardia EXAM: PORTABLE CHEST 1 VIEW COMPARISON:  02/23/2013 FINDINGS: Single frontal view of the chest demonstrates an enlarged cardiac silhouette. Small right pleural effusion obscures the costophrenic angle. No airspace disease or pneumothorax. No acute bony abnormalities. IMPRESSION: 1. Enlarged cardiac silhouette. 2. Small right pleural effusion. Electronically Signed   By: MRanda NgoM.D.   On: 10/07/2020 02:53     Assessment and Plan:   1. Tachycardia/ Likely  atrial flutter - Heart rate in 130s.  He was given IV adenosine 12 mg x 2 at 5:30 AM.  No strip at bedside or event recorded on monitor/telemetry.  Rhythm looks like atrial flutter.  He is started on IV heparin prophylactically.  No improvement on IV metoprolol 5 mg x 1.  Now on home dose of nifedipine. - Continue IV heparin for now and change to NOAC when stable  - Stop nifedipine   -Will start IV amiodarone given soft blood pressure - Likely EP evaluation in AM - Echo when HR is better improved   2.  Chronic diastolic heart failure -with Acute on Chronic Exacerbation related to Tachycardia (cannot exclude a component of Systolic failure with Tachycardia Induced Cardiomyopathy) - Evidence of volume overload on exam -pending echocardiogram this admission -BNP greater than 4000 -Agree with IV diuresis -INO and daily weight -Follow renal function closely on diuresis  3.  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  Elevated D-dimer -VQ scan negative for pulmonary embolism  5. HTN - BP soft  - Hold home antihypertensive   6. Elevated troponin - Likely demand ischemia from tachycardia and AKI  Risk Assessment/Risk Scores:    New York Heart Association (NYHA) Functional Class NYHA Class II  CHA2DS2-VASc Score = 5  This indicates a 7.2% annual risk of stroke. The patient's score is based upon: CHF History: Yes HTN History: Yes Diabetes History: Yes Stroke History: No Vascular Disease History: No Age Score: 2 Gender Score: 0  For questions or updates, please contact CEl CerroPlease consult [email or web address] for contact info under    SJarrett Soho PUtah 10/07/2020 3:56 PM    ATTENDING ATTESTATION  I have seen, examined and evaluated the patient this evening along with Mr. BCurly Shores PUtah  After reviewing all the available data and chart, we discussed the patients laboratory, study & physical findings as well as symptoms in detail. I  agree with his findings, examination as well as impression recommendations as per our discussion.    Attending adjustments noted in italics.   Totally agree, very poor historian.  Difficulty with understanding  symptoms.  He does appear to be in what looks like atrial flutter but relatively slow at baseline.  Unfortunately, we do not have the rhythm strips that show what happened with adenosine.  I tried diagnosis is on several occasions and was not able to to induce a pause.  Clearly he is having some heart failure symptoms response to tachycardia-perhaps tachycardia his cardiomyopathy.   Check an echocardiogram while he is in tachycardia is not feasible, would wait till this is resolved.  We are starting IV amiodarone since his blood pressure will not necessarily tolerate traditional rate control.  With his renal sufficiency and clear evidence of volume overload, we will see how he responds to the IV Lasix, may potentially be on the reverse slope of the Starling curve and requiring diuresis to reduce left ventricular dilation/filling pressures.  No further recommendation beyond what is noted above.  I agree with trying to convert back to rhythm.  We can then determine what his EF is out of tachycardia/atrial flutter.     Glenetta Hew, M.D., M.S. Interventional Cardiologist   Pager # [phone] Phone # [phone] [address] [place], [place] [address]

## 2020-10-07 NOTE — Progress Notes (Signed)
ANTICOAGULATION CONSULT NOTE - Initial Consult  Pharmacy Consult for Heparin  Indication: atrial fibrillation  No Known Allergies  Patient Measurements: Height: '5\' 6"'$  (167.6 cm) Weight: 111.6 kg (246 lb) IBW/kg (Calculated) : 63.8 Vital Signs: Temp: 98.3 F (36.8 C) (03/15 0307) Temp Source: Rectal (03/15 0307) BP: 130/101 (03/15 0445) Pulse Rate: 129 (03/15 0445)  Labs: Recent Labs    10/07/20 0229  HGB 13.5  HCT 43.9  PLT 165  CREATININE 2.78*  TROPONINIHS 75*    Estimated Creatinine Clearance: 26.1 mL/min (A) (by C-G formula based on SCr of 2.78 mg/dL (H)).   Medical History: Past Medical History:  Diagnosis Date  . Diabetes mellitus without complication ([place])   . HTN (hypertension)      Assessment: 80 y/o M presents to the ED with weakness/fall. Starting heparin drip for afib. CBC good. Noted renal dysfunction. PTA meds reviewed.   Goal of Therapy:  Heparin level 0.3-0.7 units/ml Monitor platelets by anticoagulation protocol: Yes   Plan:  Heparin 4000 units BOLUS Start heparin drip at 1200 units/hr 1300 Heparin level Daily CBC/Heparin level Monitor for bleeding  Narda Bonds, PharmD, BCPS Clinical Pharmacist Phone: [phone]

## 2020-10-07 NOTE — Plan of Care (Signed)
  Problem: Clinical Measurements: Goal: Ability to maintain clinical measurements within normal limits will improve Outcome: Progressing Goal: Diagnostic test results will improve Outcome: Progressing   

## 2020-10-07 NOTE — ED Notes (Signed)
MD aware of HR 125-136 bpm. Cardiology is consulted due to bp on the lower side. Pt remains alert and oriented x 4. Will continue to monitor.

## 2020-10-07 NOTE — ED Notes (Signed)
Heparin infusing to RAC at 1200u/hr, site nml, saline lock to LAC nml, drsg c/d/i

## 2020-10-07 NOTE — Progress Notes (Signed)
   10/07/20 1700  Assess: MEWS Score  BP 97/76  ECG Heart Rate (!) 131  Assess: MEWS Score  MEWS Temp 0  MEWS Systolic 1  MEWS Pulse 3  MEWS RR 1  MEWS LOC 0  MEWS Score 5  MEWS Score Color Red  Assess: if the MEWS score is Yellow or Red  Were vital signs taken at a resting state? Yes  Focused Assessment No change from prior assessment  Early Detection of Sepsis Score *See Row Information* Medium  MEWS guidelines implemented *See Row Information* Yes  Treat  MEWS Interventions Administered scheduled meds/treatments  Pain Scale 0-10  Pain Score 0  Take Vital Signs  Increase Vital Sign Frequency  Red: Q 1hr X 4 then Q 4hr X 4, if remains red, continue Q 4hrs  Escalate  MEWS: Escalate Red: discuss with charge nurse/RN and provider, consider discussing with RRT  Notify: Provider  Provider Name/Title Dr. Ellyn Hack  Date Provider Notified 10/07/20  Time Provider Notified [phone]  Notification Type Rounds  Provider response At bedside  Document  Progress note created (see row info) Yes   Patient received from the ER. Amio gtt ordered per cardiology.

## 2020-10-07 NOTE — ED Notes (Addendum)
Update given to pt's wife, Clarinda. She requests to be notified when patient receives a bed upstairs.

## 2020-10-07 NOTE — ED Notes (Signed)
CBG checked twice, first one was 60, second was 58, pt a/o times 4, no complaints

## 2020-10-07 NOTE — Progress Notes (Addendum)
ANTICOAGULATION CONSULT NOTE  Pharmacy Consult for Heparin  Indication: atrial fibrillation  No Known Allergies  Patient Measurements: Height: 5\' 6"  (167.6 cm) Weight: 111.6 kg (246 lb) IBW/kg (Calculated) : 63.8 Vital Signs: Temp: 98 F (36.7 C) (03/15 1620) Temp Source: Oral (03/15 1620) BP: 102/76 (03/15 1620) Pulse Rate: 81 (03/15 1620)  Labs: Recent Labs    10/07/20 0229 10/07/20 0429 10/07/20 1520  HGB 13.5  --   --   HCT 43.9  --   --   PLT 165  --   --   HEPARINUNFRC  --   --  0.19*  CREATININE 2.78*  --   --   TROPONINIHS 75* 81*  --     Estimated Creatinine Clearance: 26.1 mL/min (A) (by C-G formula based on SCr of 2.78 mg/dL (H)).   Medical History: Past Medical History:  Diagnosis Date  . Diabetes mellitus without complication ([facility])   . HTN (hypertension)      Assessment: 80 y/o M presents to the ED with weakness/fall. Starting heparin drip for afib. Noted renal dysfunction on admission. No AC PTA.   Initial heparin level came back subtherapeutic at 0.19, on 1200 units/hr. Hgb 13.5, plt 165. D-dimer 4.72. No s/sx of bleeding or infusion issues. VQ not showing PE.   Goal of Therapy:  Heparin level 0.3-0.7 units/ml Monitor platelets by anticoagulation protocol: Yes   Plan:  Heparin 1000 units BOLUS Start heparin drip at 1400 units/hr Order heparin level in 8 hr  Daily CBC/Heparin level Monitor for bleeding  Sherron Monday, PharmD, BCCCP Clinical Pharmacist  Phone: [phone] 10/07/2020 4:33 PM  Please check AMION for all [facility] Pharmacy phone numbers After 10:00 PM, call Main Pharmacy [phone]

## 2020-10-07 NOTE — ED Notes (Signed)
4oz orange juice given

## 2020-10-07 NOTE — ED Provider Notes (Signed)
[facility] EMERGENCY DEPARTMENT Provider Note   CSN: [id number] Arrival date & time: 10/07/20  0209     History Chief Complaint  Patient presents with  . Weakness  . Fall    Kyle Hicks is a 80 y.o. male.  HPI     This is a 80 year old male with a history of diabetes and hypertension, chronic renal insufficiency who presents with generalized weakness.  Patient was brought in by EMS who reports wife called after he slipped off the couch trying to get up.  Wife endorsed worsening weakness over the last several months.  Patient noted to be tachycardic in route.  On my evaluation, patient states that "my legs just gave out."  He states that he got up to go the restroom and that his legs just gave out.  He did not note any asymmetric weakness, numbness, tingling, speech difficulty.  Denies vision changes.  He denies any recent illnesses, fevers, shortness of breath, chest pain.  He has no complaints at this time  Past Medical History:  Diagnosis Date  . Diabetes mellitus without complication ([place])   . HTN (hypertension)     Patient Active Problem List   Diagnosis Date Noted  . 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 Anemia   . UGI bleed   . Duodenal ulcer   . Mucosal abnormality of stomach   . Rectal bleed 04/27/2017  . Uncontrolled type 2 diabetes mellitus with stage 2 chronic kidney disease, without long-term current use of insulin ([place]) 04/25/2017  . Melena 04/25/2017  . Hypomagnesemia 04/25/2017  . Chronic renal insufficiency, stage 2 (mild)   . GIB (gastrointestinal bleeding) 04/24/2017  . Elevated troponin 04/24/2017  . Weakness generalized 04/24/2017  . Heme positive stool   . Mixed hyperlipidemia 06/24/2015  . Encephalopathy acute 02/23/2013  . Acute on chronic renal failure ([place]) 02/23/2013  . Morbid obesity ([place]) 02/23/2013  . Uncontrolled type 2 diabetes mellitus with stage 2 chronic kidney disease ([place])   . Essential  hypertension     Past Surgical History:  Procedure Laterality Date  . ESOPHAGOGASTRODUODENOSCOPY N/A 04/27/2017   Procedure: ESOPHAGOGASTRODUODENOSCOPY (EGD);  Surgeon: Rogene Houston, MD;  Location: [facility] ENDO SUITE;  Service: Endoscopy;  Laterality: N/A;       Family History  Problem Relation Age of Onset  . Hypertension Mother   . Hyperlipidemia Mother   . Hypertension Father   . Hyperlipidemia Father   . Colon cancer Neg Hx     Social History   Tobacco Use  . Smoking status: Former Smoker    Packs/day: 0.50    Types: Cigarettes    Quit date: 07/26/1987    Years since quitting: 33.2  . Smokeless tobacco: Never Used  Vaping Use  . Vaping Use: Never used  Substance Use Topics  . Alcohol use: No    Alcohol/week: 0.0 standard drinks    Comment: Previously drank socially, last drank 25-30 years ago  . Drug use: No    Home Medications Prior to Admission medications   Medication Sig Start Date End Date Taking? Authorizing Provider  atorvastatin (LIPITOR) 20 MG tablet Take 20 mg by mouth daily.    [provider]  blood glucose meter kit and supplies KIT Dispense based on patient and insurance preference. Use up to 2 times daily as directed. (FOR ICD-10 E11.65) 04/12/18   Cassandria Anger, MD  glipiZIDE (GLUCOTROL) 5 MG tablet Take 1 tablet by mouth once daily with breakfast 07/16/20  Cassandria Anger, MD  glucose blood (ACCU-CHEK GUIDE) test strip Use as instructed bid E11.65 02/17/18   Cassandria Anger, MD  hydrochlorothiazide (HYDRODIURIL) 25 MG tablet TAKE 1 TABLET(25 MG) BY MOUTH DAILY 06/16/18   Nida, Marella Chimes, MD  Lancets (ACCU-CHEK MULTICLIX) lancets Use as instructed bid. E11.65 02/17/18   Cassandria Anger, MD  NIFEdipine (PROCARDIA-XL/ADALAT-CC/NIFEDICAL-XL) 30 MG 24 hr tablet take 1 tablet by mouth once daily 02/14/17   Cassandria Anger, MD  ramipril (ALTACE) 10 MG capsule Take 1 capsule by mouth once daily 10/09/19   Cassandria Anger, MD  sitaGLIPtin-metformin (JANUMET) 50-1000 MG tablet Take 1/2 (one-half) tablet by mouth twice daily 12/10/19   Cassandria Anger, MD    Allergies    Patient has no known allergies.  Review of Systems   Review of Systems  Constitutional: Negative for fever.  Respiratory: Negative for shortness of breath.   Cardiovascular: Positive for leg swelling. Negative for chest pain and palpitations.  Gastrointestinal: Negative for abdominal pain, diarrhea and vomiting.  Neurological: Positive for weakness.  All other systems reviewed and are negative.   Physical Exam Updated Vital Signs BP (!) 130/101   Pulse (!) 129   Temp 98.3 F (36.8 C) (Rectal)   Resp (!) 25   Ht 1.676 m (5' 6")   Wt 111.6 kg   SpO2 99%   BMI 39.71 kg/m   Physical Exam Vitals and nursing note reviewed.  Constitutional:      Appearance: He is well-developed. He is obese.  HENT:     Head: Normocephalic and atraumatic.     Mouth/Throat:     Mouth: Mucous membranes are moist.  Eyes:     Pupils: Pupils are equal, round, and reactive to light.  Cardiovascular:     Rate and Rhythm: Regular rhythm. Tachycardia present.     Heart sounds: Normal heart sounds. No murmur heard.   Pulmonary:     Effort: Pulmonary effort is normal. No respiratory distress.     Breath sounds: Normal breath sounds. No wheezing.     Comments: Tachypnea, speaking in full sentences Abdominal:     General: Bowel sounds are normal.     Palpations: Abdomen is soft.     Tenderness: There is no abdominal tenderness. There is no rebound.  Musculoskeletal:     Cervical back: Neck supple.     Right lower leg: Edema present.     Left lower leg: Edema present.     Comments: 3+ pitting edema bilateral lower extremities  Lymphadenopathy:     Cervical: No cervical adenopathy.  Skin:    General: Skin is warm and dry.     Comments: Chronic venous stasis changes left lower extremity  Neurological:     Mental Status: He is  alert and oriented to person, place, and time.     Comments: Cranial nerves II through XII intact, 5 out of 5 strength in all 4 extremities, no dysmetria to finger-nose-finger, no drift  Psychiatric:        Mood and Affect: Mood normal.     ED Results / Procedures / Treatments   Labs (all labs ordered are listed, but only abnormal results are displayed) Labs Reviewed  CBC WITH DIFFERENTIAL/PLATELET - Abnormal; Notable for the following components:      Result Value   RDW 19.3 (*)    All other components within normal limits  COMPREHENSIVE METABOLIC PANEL - Abnormal; Notable for the following components:   Chloride 113 (*)  CO2 13 (*)    BUN 47 (*)    Creatinine, Ser 2.78 (*)    Calcium 8.7 (*)    Albumin 3.4 (*)    Total Bilirubin 1.4 (*)    GFR, Estimated 23 (*)    All other components within normal limits  BRAIN NATRIURETIC PEPTIDE - Abnormal; Notable for the following components:   B Natriuretic Peptide 4,299.0 (*)    All other components within normal limits  LACTIC ACID, PLASMA - Abnormal; Notable for the following components:   Lactic Acid, Venous 2.0 (*)    All other components within normal limits  D-DIMER, QUANTITATIVE - Abnormal; Notable for the following components:   D-Dimer, Quant 4.72 (*)    All other components within normal limits  TROPONIN I (HIGH SENSITIVITY) - Abnormal; Notable for the following components:   Troponin I (High Sensitivity) 75 (*)    All other components within normal limits  RESP PANEL BY RT-PCR (FLU A&B, COVID) [id number]  LACTIC ACID, PLASMA  URINALYSIS, ROUTINE W REFLEX MICROSCOPIC  HEPARIN LEVEL (UNFRACTIONATED)  TROPONIN I (HIGH SENSITIVITY)    EKG EKG Interpretation  Date/Time:  Tuesday October 07 2020 02:18:25 EDT Ventricular Rate:  133 PR Interval:    QRS Duration: 144 QT Interval:  344 QTC Calculation: 512 R Axis:   -93 Text Interpretation: Sinus tachycardia Consider right atrial enlargement Right bundle branch block  Inferior infarct, age indeterminate similar to prior Confirmed by Thayer Jew [phone]) on 10/07/2020 2:20:34 AM   EKG Interpretation  Date/Time:  Tuesday October 07 2020 04:20:42 EDT Ventricular Rate:  135 PR Interval:    QRS Duration: 144 QT Interval:  344 QTC Calculation: 516 R Axis:   -92 Text Interpretation: Sinus tachycardia Right bundle branch block Inferior infarct, age indeterminate Confirmed by Thayer Jew ([id number]) on 10/07/2020 4:22:40 AM       Radiology DG Chest Portable 1 View  Result Date: 10/07/2020 CLINICAL DATA:  [name], short of breath, tachycardia EXAM: PORTABLE CHEST 1 VIEW COMPARISON:  02/23/2013 FINDINGS: Single frontal view of the chest demonstrates an enlarged cardiac silhouette. Small right pleural effusion obscures the costophrenic angle. No airspace disease or pneumothorax. No acute bony abnormalities. IMPRESSION: 1. Enlarged cardiac silhouette. 2. Small right pleural effusion. Electronically Signed   By: Randa Ngo M.D.   On: 10/07/2020 02:53    Procedures .Critical Care Performed by: Merryl Hacker, MD Authorized by: Merryl Hacker, MD   Critical care provider statement:    Critical care time (minutes):  20   Critical care was time spent personally by me on the following activities:  Discussions with consultants, evaluation of patient's response to treatment, examination of patient, ordering and performing treatments and interventions, ordering and review of laboratory studies, ordering and review of radiographic studies, pulse oximetry, re-evaluation of patient's condition, obtaining history from patient or surrogate and review of old charts     Medications Ordered in ED Medications  heparin ADULT infusion 100 units/mL (25000 units/241m) (1,200 Units/hr Intravenous New Bag/Given 10/07/20 0453)  metoprolol tartrate (LOPRESSOR) injection 5 mg (5 mg Intravenous Given 10/07/20 0427)  heparin bolus via infusion 4,000 Units (4,000 Units  Intravenous Bolus from Bag 10/07/20 0453)  adenosine (ADENOCARD) 6 MG/2ML injection 12 mg (12 mg Intravenous Given 10/07/20 0527)  furosemide (LASIX) injection 40 mg (40 mg Intravenous Given 10/07/20 0535)  adenosine (ADENOCARD) 6 MG/2ML injection 12 mg (12 mg Intravenous Given 10/07/20 0533)    ED Course  I have reviewed the triage vital signs and  the nursing notes.  Pertinent labs & imaging results that were available during my care of the patient were reviewed by me and considered in my medical decision making (see chart for details).  Clinical Course as of 10/07/20 0535  Tue Oct 07, 2020  0512 No improvement with 5 mg of metoprolol.  Cardiology was consulted.  Spoke with Dr. Sonia Side.  Recommends giving 1 dose of adenosine to assess for underlying rhythm.  Even if atrial flutter, would avoid diltiazem or significant beta-blockade given that the patient is in acute failure.  Recommends diuresis.  We will plan for admission to the hospitalist and formal cardiology evaluation by day team.  Also agrees with repeat troponin. [CH]    Clinical Course User Index [CH] Horton, Barbette Hair, MD   MDM Rules/Calculators/A&P                           Patient presents with generalized weakness.  He is markedly volume overloaded on exam, tachycardic, tachypneic but satting 98% on room air.  He is not a great historian.  Denies any other history besides diabetes.  I have reviewed his chart.  His last echocardiogram in 2018 was a poor study but EF was estimated at 55%.  Work-up initiated.  Other considerations for tachypnea, tachycardia could be sepsis, pneumonia, CHF, ACS although he is not having any chest discomfort.  Chest x-ray shows cardiomegaly with a pleural effusion.  He is afebrile and there is no evidence of pneumonia.  Lactate is normal.  Doubt sepsis or infectious process at this time.  Covid testing is negative.  Interestingly his heart rate has been maintained at approximately 1 30-1 33.  It appears  sinus on EKG.  However, given lack of variability, question underlying arrhythmia such as atrial flutter.  BNP is markedly elevated greater than 4000.  D-dimer is also elevated which could indicate potential PE.  Unfortunately, creatinine is 2.78 which precludes CT imaging for PE.  Will start on heparin.  Troponin is marginally elevated at 75.  However, this is in the setting of AKI.  Will repeat.  Patient was given 1 dose of 5 mg of metoprolol with minimal effect on his heart rate.  See conversation with cardiology above.  I did attempt adenosine x2.  He was given to 12 mg doses in a proximal IV on the left arm.  Neither dose resulted in any response.  Tachycardia did not break an underlying rhythm was not revealed.  He appeared to have an appropriately working IV and adenosine was pushed quickly under my direct supervision x2.  40 mg of IV Lasix was ordered.  He will need formal cardiology evaluation first thing in the morning by the day team.  We will consult the hospitalist for admission.  Final Clinical Impression(s) / ED Diagnoses Final diagnoses:  Decompensated heart failure (HCC)  Tachycardia  AKI (acute kidney injury) ([place])  Elevated troponin    Rx / DC Orders ED Discharge Orders    None       Horton, Barbette Hair, MD 10/07/20 [phone]

## 2020-10-08 ENCOUNTER — Inpatient Hospital Stay (HOSPITAL_COMMUNITY): Payer: Medicare HMO

## 2020-10-08 DIAGNOSIS — R7989 Other specified abnormal findings of blood chemistry: Secondary | ICD-10-CM

## 2020-10-08 DIAGNOSIS — N179 Acute kidney failure, unspecified: Secondary | ICD-10-CM | POA: Diagnosis not present

## 2020-10-08 DIAGNOSIS — I5031 Acute diastolic (congestive) heart failure: Secondary | ICD-10-CM | POA: Diagnosis not present

## 2020-10-08 DIAGNOSIS — R778 Other specified abnormalities of plasma proteins: Secondary | ICD-10-CM

## 2020-10-08 DIAGNOSIS — I872 Venous insufficiency (chronic) (peripheral): Secondary | ICD-10-CM

## 2020-10-08 DIAGNOSIS — I509 Heart failure, unspecified: Secondary | ICD-10-CM

## 2020-10-08 DIAGNOSIS — I5041 Acute combined systolic (congestive) and diastolic (congestive) heart failure: Secondary | ICD-10-CM | POA: Diagnosis not present

## 2020-10-08 DIAGNOSIS — I5043 Acute on chronic combined systolic (congestive) and diastolic (congestive) heart failure: Secondary | ICD-10-CM

## 2020-10-08 DIAGNOSIS — R609 Edema, unspecified: Secondary | ICD-10-CM

## 2020-10-08 DIAGNOSIS — R197 Diarrhea, unspecified: Secondary | ICD-10-CM | POA: Diagnosis not present

## 2020-10-08 DIAGNOSIS — I4510 Unspecified right bundle-branch block: Secondary | ICD-10-CM

## 2020-10-08 HISTORY — DX: Acute diastolic (congestive) heart failure: I50.31

## 2020-10-08 LAB — BASIC METABOLIC PANEL
Anion gap: 9 (ref 5–15)
BUN: 51 mg/dL — ABNORMAL HIGH (ref 8–23)
CO2: 12 mmol/L — ABNORMAL LOW (ref 22–32)
Calcium: 8.2 mg/dL — ABNORMAL LOW (ref 8.9–10.3)
Chloride: 116 mmol/L — ABNORMAL HIGH (ref 98–111)
Creatinine, Ser: 2.69 mg/dL — ABNORMAL HIGH (ref 0.61–1.24)
GFR, Estimated: 24 mL/min — ABNORMAL LOW (ref >60)
Glucose, Bld: 117 mg/dL — ABNORMAL HIGH (ref 70–99)
Potassium: 5.1 mmol/L (ref 3.5–5.1)
Sodium: 137 mmol/L (ref 135–145)

## 2020-10-08 LAB — CBC
HCT: 36.4 % — ABNORMAL LOW (ref 39.0–52.0)
Hemoglobin: 12.2 g/dL — ABNORMAL LOW (ref 13.0–17.0)
MCH: 29.5 pg (ref 26.0–34.0)
MCHC: 33.5 g/dL (ref 30.0–36.0)
MCV: 87.9 fL (ref 80.0–100.0)
Platelets: 151 10*3/uL (ref 150–400)
RBC: 4.14 MIL/uL — ABNORMAL LOW (ref 4.22–5.81)
RDW: 19.2 % — ABNORMAL HIGH (ref 11.5–15.5)
WBC: 6.2 10*3/uL (ref 4.0–10.5)
nRBC: 0 % (ref 0.0–0.2)

## 2020-10-08 LAB — GLUCOSE, CAPILLARY
Glucose-Capillary: 107 mg/dL — ABNORMAL HIGH (ref 70–99)
Glucose-Capillary: 129 mg/dL — ABNORMAL HIGH (ref 70–99)
Glucose-Capillary: 129 mg/dL — ABNORMAL HIGH (ref 70–99)
Glucose-Capillary: 138 mg/dL — ABNORMAL HIGH (ref 70–99)
Glucose-Capillary: 97 mg/dL (ref 70–99)
Glucose-Capillary: 97 mg/dL (ref 70–99)

## 2020-10-08 LAB — ECHOCARDIOGRAM COMPLETE
Area-P 1/2: 6.52 cm2
Height: 66 in
MV M vel: 4.23 m/s
MV Peak grad: 71.6 mmHg
Radius: 0.2 cm
S' Lateral: 5.1 cm
Weight: 4165.81 oz

## 2020-10-08 LAB — HEPARIN LEVEL (UNFRACTIONATED)
Heparin Unfractionated: 0.13 IU/mL — ABNORMAL LOW (ref 0.30–0.70)
Heparin Unfractionated: 0.6 IU/mL (ref 0.30–0.70)

## 2020-10-08 MED ORDER — FUROSEMIDE 10 MG/ML IJ SOLN
60.0000 mg | Freq: Two times a day (BID) | INTRAMUSCULAR | Status: DC
  Administered 2020-10-08 – 2020-10-09 (×2): 60 mg via INTRAVENOUS
  Filled 2020-10-08 (×2): qty 6

## 2020-10-08 MED ORDER — RIVAROXABAN 15 MG PO TABS
15.0000 mg | ORAL_TABLET | Freq: Every day | ORAL | Status: DC
  Administered 2020-10-08: 15 mg via ORAL
  Filled 2020-10-08: qty 1

## 2020-10-08 MED ORDER — PERFLUTREN LIPID MICROSPHERE
1.0000 mL | INTRAVENOUS | Status: AC | PRN
  Administered 2020-10-08: 2 mL via INTRAVENOUS
  Filled 2020-10-08: qty 10

## 2020-10-08 MED ORDER — HEPARIN BOLUS VIA INFUSION
2000.0000 [IU] | Freq: Once | INTRAVENOUS | Status: AC
  Administered 2020-10-08: 2000 [IU] via INTRAVENOUS
  Filled 2020-10-08: qty 2000

## 2020-10-08 NOTE — Progress Notes (Signed)
  Echocardiogram 2D Echocardiogram has been performed.  Kyle Hicks 10/08/2020, 3:43 PM

## 2020-10-08 NOTE — Progress Notes (Signed)
PROGRESS NOTE    Kyle Hicks  [id number] DOB: [birth year] DOA: 10/07/2020 PCP: Sharilyn Sites, MD    Brief Narrative:  Kyle Hicks is a 80 y.o. male with medical history significant of hypertension, diastolic CHF last EF [id number] with grade 1 diastolic dysfunction, diabetes mellitus type 2, and PUD with history of GI bleed presents after having a fall at home.  He reports that he had not been feeling well for the last 2 days.  This morning when he got up to use the restroom he reports that his left leg got weak and he fell to his side off the couch.  He did not hit his head or lose consciousness.  Denies any change in speech, vision, sensation.  He feels globally weak.  Noted associated symptoms of diarrhea, possibly some weight loss, leg swelling, shortness of breath, intermittent wheezing, and palpitations.  Patient has not noticed any blood or dark stools, but he had been getting up to have a stool every 25-30 minutes recently.  Denies having any significant cough, calf/leg pain, or orthopnea symptoms.  Wife notes that he had been very weak yesterday and stayed in the bathroom all day.  He had not been eating well and had been drinking Ensure recently.    Consultants:   cardiology  Procedures:   Antimicrobials:       Subjective:  Less sob, no cp No complaints this am for me. Monitor: likely aflutter   Objective: Vitals:   10/08/20 0530 10/08/20 0600 10/08/20 0630 10/08/20 0758  BP:    (!) 121/93  Pulse:    (!) 130  Resp: (!) 25 (!) 28 (!) 21 20  Temp:    98.3 F (36.8 C)  TempSrc:    Oral  SpO2:    100%  Weight:      Height:        Intake/Output Summary (Last 24 hours) at 10/08/2020 1047 Last data filed at 10/08/2020 1035 Gross per 24 hour  Intake 972.49 ml  Output 300 ml  Net 672.49 ml   Filed Weights   10/07/20 0216 10/08/20 0300  Weight: 111.6 kg 118.1 kg    Examination:  General exam: Appears calm and comfortable  Respiratory system: +scattered  crackles in bases Cardiovascular system: Tachycardic S1 & S2 heard, NO murmurs, rubs, gallops or clicks. No pedal edema. Gastrointestinal system: Abdomen is nondistended, soft and nontender.Normal bowel sounds heard. Central nervous system: Alert and oriented. Grossly intact Extremities: 3+pitting edema b/l Skin: warm dry Psychiatry:  Mood & affect appropriate.     Data Reviewed: I have personally reviewed following labs and imaging studies  CBC: Recent Labs  Lab 10/07/20 0229 10/08/20 0119  WBC 7.5 6.2  NEUTROABS 5.9  --   HGB 13.5 12.2*  HCT 43.9 36.4*  MCV 91.1 87.9  PLT 165 [id number]   Basic Metabolic Panel: Recent Labs  Lab 10/07/20 0229 10/07/20 2011 10/08/20 0119  NA 139 135 137  K 5.1 5.1 5.1  CL 113* 112* 116*  CO2 13* 12* 12*  GLUCOSE 73 140* 117*  BUN 47* 49* 51*  CREATININE 2.78* 2.63* 2.69*  CALCIUM 8.7* 8.0* 8.2*  MG 1.9 1.7  --    GFR: Estimated Creatinine Clearance: 27.8 mL/min (A) (by C-G formula based on SCr of 2.69 mg/dL (H)). Liver Function Tests: Recent Labs  Lab 10/07/20 0229  AST 29  ALT 26  ALKPHOS 49  BILITOT 1.4*  PROT 7.0  ALBUMIN 3.4*   No results for input(s):  LIPASE, AMYLASE in the last 168 hours. No results for input(s): AMMONIA in the last 168 hours. Coagulation Profile: No results for input(s): INR, PROTIME in the last 168 hours. Cardiac Enzymes: No results for input(s): CKTOTAL, CKMB, CKMBINDEX, TROPONINI in the last 168 hours. BNP (last 3 results) No results for input(s): PROBNP in the last 8760 hours. HbA1C: Recent Labs    10/07/20 1300  HGBA1C 5.9*   CBG: Recent Labs  Lab 10/07/20 1532 10/07/20 2026 10/08/20 0000 10/08/20 0409 10/08/20 0759  GLUCAP 86 141* 129* 129* 97   Lipid Profile: No results for input(s): CHOL, HDL, LDLCALC, TRIG, CHOLHDL, LDLDIRECT in the last 72 hours. Thyroid Function Tests: Recent Labs    10/07/20 1300  TSH 0.720   Anemia Panel: No results for input(s): VITAMINB12, FOLATE,  FERRITIN, TIBC, IRON, RETICCTPCT in the last 72 hours. Sepsis Labs: Recent Labs  Lab 10/07/20 0229 10/07/20 0429  LATICACIDVEN 2.0* 1.8    Recent Results (from the past 240 hour(s))  Resp Panel by RT-PCR (Flu A&B, Covid) Nasopharyngeal Swab     Status: None   Collection Time: 10/07/20  3:07 AM   Specimen: Nasopharyngeal Swab; Nasopharyngeal(NP) swabs in vial transport medium  Result Value Ref Range Status   SARS Coronavirus 2 by RT PCR NEGATIVE NEGATIVE Final    Comment: (NOTE) SARS-CoV-2 target nucleic acids are NOT DETECTED.  The SARS-CoV-2 RNA is generally detectable in upper respiratory specimens during the acute phase of infection. The lowest concentration of SARS-CoV-2 viral copies this assay can detect is 138 copies/mL. A negative result does not preclude SARS-Cov-2 infection and should not be used as the sole basis for treatment or other patient management decisions. A negative result may occur with  improper specimen collection/handling, submission of specimen other than nasopharyngeal swab, presence of viral mutation(s) within the areas targeted by this assay, and inadequate number of viral copies(<138 copies/mL). A negative result must be combined with clinical observations, patient history, and epidemiological information. The expected result is Negative.  Fact Sheet for Patients:  EntrepreneurPulse.com.au  Fact Sheet for Healthcare Providers:  IncredibleEmployment.be  This test is no t yet approved or cleared by the [place] FDA and  has been authorized for detection and/or diagnosis of SARS-CoV-2 by FDA under an Emergency Use Authorization (EUA). This EUA will remain  in effect (meaning this test can be used) for the duration of the COVID-19 declaration under Section 564(b)(1) of the Act, 21 U.S.C.section 360bbb-3(b)(1), unless the authorization is terminated  or revoked sooner.       Influenza A by PCR NEGATIVE  NEGATIVE Final   Influenza B by PCR NEGATIVE NEGATIVE Final    Comment: (NOTE) The Xpert Xpress SARS-CoV-2/FLU/RSV plus assay is intended as an aid in the diagnosis of influenza from Nasopharyngeal swab specimens and should not be used as a sole basis for treatment. Nasal washings and aspirates are unacceptable for Xpert Xpress SARS-CoV-2/FLU/RSV testing.  Fact Sheet for Patients: EntrepreneurPulse.com.au  Fact Sheet for Healthcare Providers: IncredibleEmployment.be  This test is not yet approved or cleared by the [place] FDA and has been authorized for detection and/or diagnosis of SARS-CoV-2 by FDA under an Emergency Use Authorization (EUA). This EUA will remain in effect (meaning this test can be used) for the duration of the COVID-19 declaration under Section 564(b)(1) of the Act, 21 U.S.C. section 360bbb-3(b)(1), unless the authorization is terminated or revoked.  Performed at [facility], [place] [address] [place], [place] [address]  Radiology Studies: NM Pulmonary Perfusion  Result Date: 10/07/2020 CLINICAL DATA:  Shortness of breath. EXAM: NUCLEAR MEDICINE PERFUSION LUNG SCAN TECHNIQUE: Perfusion images were obtained in multiple projections after intravenous injection of radiopharmaceutical. Views: Anterior, posterior, left lateral, right lateral, RPO, LPO, RAO, LAO. RADIOPHARMACEUTICALS:  4.0 mCi Tc-56mMAA IV COMPARISON:  Chest radiograph October 07, 2020 FINDINGS: Radiotracer uptake is homogeneous and symmetric bilaterally. No perfusion defects evident. IMPRESSION: No perfusion defects evident. No findings indicative of pulmonary embolus. Electronically Signed   By: WLowella GripIII M.D.   On: 10/07/2020 11:02   DG Chest Portable 1 View  Result Date: 10/07/2020 CLINICAL DATA:  [place] [address] short of breath, tachycardia EXAM: PORTABLE CHEST 1 VIEW COMPARISON:  02/23/2013 FINDINGS: Single frontal view of the chest  demonstrates an enlarged cardiac silhouette. Small right pleural effusion obscures the costophrenic angle. No airspace disease or pneumothorax. No acute bony abnormalities. IMPRESSION: 1. Enlarged cardiac silhouette. 2. Small right pleural effusion. Electronically Signed   By: MRanda NgoM.D.   On: 10/07/2020 02:53        Scheduled Meds:  atorvastatin  20 mg Oral Daily   furosemide  40 mg Intravenous BID   insulin aspart  0-6 Units Subcutaneous Q4H   sodium chloride flush  3 mL Intravenous Q12H   Continuous Infusions:  sodium chloride     amiodarone 30 mg/hr (10/08/20 0635)   heparin 1,600 Units/hr (10/08/20 0945)    Assessment & Plan:   Principal Problem:   Acute CHF (congestive heart failure) (HCC) Active Problems:   Acute kidney injury superimposed on chronic kidney disease ([place]   Essential hypertension   Elevated d-dimer   Obesity (BMI 30-39.9)   Diarrhea   Acute on chronic diastolic congestive heart failure exacerbation:  Still volume overloaded I/o Daily wt Cards following Continue lasix iv from '40mg'$  to '60mg'$  bid   Elevated D-dimer: Acute.  On admission D-dimer elevated at 4.72.  Unable to obtain a CT angiogram of the chest with contrast due to kidney function. 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  Baseline creatinine previously have been around 1.5.   Suspect hypoperfusion due to tachycardia and heart failure exacerbation. 3/16--Hold nephrotoxic agents  -continue iv laxi Monitor labs  Tachycardia/probable  Aflutter- Rate  Not controlled On iv amiodarone Continue iv a/c and then change to NOAC when stable Cards following EP evaluation   Fall and generalized weakness: Patient presents after having a fall at home.  He reportedly has been having diarrhea all  day yesterday which may be a contributing factor to his weakness. PT  Diabetes mellitus type 2, hypoglycemia: Acute.  On admission glucose 63.  Last hemoglobin A1c 6.5 in 03/2020.  Home medications include glipizide 5 mg daily, and Janumet 25- 500 mg tablet twice daily. 3/16-BG levels controlled Continue RISS Hold glipizide   Diarrhea: Acute.  Patient has not recently been on antibiotics, but reportedly has intermittent episodes of diarrhea. -Monitor intake and output -Consider checking C. difficile if diarrhea persist  Essential hypertension: On admission blood pressures were initially noted to be soft as 91/63. -Discontinued nifedipine per cardiology Hyperlipidemia -Continue atorvastatin 20 mg nightly  Prolonged QT interval: QTC 516  -Avoid QT prolonging medications  Obesity   DVT prophylaxis: heparin Code Status:FULL Family Communication: none at bedside  Status is: Inpatient  Remains inpatient  appropriate because:Inpatient level of care appropriate due to severity of illness   Dispo: The patient is from: Home              Anticipated d/c is to: Home              Patient currently is not medically stable to d/c.   Difficult to place patient No  Need CV, still in aflutter rvr , still needs iv meds          LOS: 1 day   Time spent: 35 min with >50% on coc   Nolberto Hanlon, MD [facility] Pager [phone]  If 7PM-7AM, please contact night-coverage 10/08/2020, 10:47 AM

## 2020-10-08 NOTE — CV Procedure (Signed)
BLE venous duplex completed. Preliminary results are negative for DVT.

## 2020-10-08 NOTE — Progress Notes (Signed)
ANTICOAGULATION CONSULT NOTE - Initial Consult  Pharmacy Consult for Heparin  Indication: atrial fibrillation  No Known Allergies  Patient Measurements: Height: '5\' 6"'$  (167.6 cm) Weight: 111.6 kg (246 lb) IBW/kg (Calculated) : 63.8 Vital Signs: Temp: 97.7 F (36.5 C) (03/15 1900) Temp Source: Oral (03/15 1900) BP: 103/90 (03/16 0030) Pulse Rate: 129 (03/16 0030)  Labs: Recent Labs    10/07/20 0229 10/07/20 0429 10/07/20 1520 10/07/20 2011 10/08/20 0119  HGB 13.5  --   --   --  12.2*  HCT 43.9  --   --   --  36.4*  PLT 165  --   --   --  151  HEPARINUNFRC  --   --  0.19*  --  0.13*  CREATININE 2.78*  --   --  2.63* 2.69*  TROPONINIHS 75* 81*  --   --   --     Estimated Creatinine Clearance: 27 mL/min (A) (by C-G formula based on SCr of 2.69 mg/dL (H)).   Medical History: Past Medical History:  Diagnosis Date  . Diabetes mellitus without complication ([place])   . HTN (hypertension)      Assessment: 80 y/o M presents to the ED with weakness/fall. Starting heparin drip for afib. CBC good. Noted renal dysfunction. PTA meds reviewed.   3/16 AM update:  Heparin level remains low No issues per RN  Goal of Therapy:  Heparin level 0.3-0.7 units/ml Monitor platelets by anticoagulation protocol: Yes   Plan:  Heparin 2000 units re-[employer] heparin to 1600 units/hr 1100 heparin level Daily CBC/Heparin level Monitor for bleeding  Narda Bonds, PharmD, BCPS Clinical Pharmacist Phone: [phone]

## 2020-10-08 NOTE — Progress Notes (Signed)
ANTICOAGULATION CONSULT NOTE  Pharmacy Consult for Heparin>> xarelto Indication: atrial fibrillation  No Known Allergies  Patient Measurements: Height: '5\' 6"'$  (167.6 cm) Weight: 118.1 kg (260 lb 5.8 oz) IBW/kg (Calculated) : 63.8 Vital Signs: Temp: 98.4 F (36.9 C) (03/16 1143) Temp Source: Oral (03/16 1143) BP: 117/91 (03/16 1143) Pulse Rate: 131 (03/16 1143)  Labs: Recent Labs    10/07/20 0229 10/07/20 0429 10/07/20 1520 10/07/20 2011 10/08/20 0119 10/08/20 1124  HGB 13.5  --   --   --  12.2*  --   HCT 43.9  --   --   --  36.4*  --   PLT 165  --   --   --  151  --   HEPARINUNFRC  --   --  0.19*  --  0.13* 0.60  CREATININE 2.78*  --   --  2.63* 2.69*  --   TROPONINIHS 75* 81*  --   --   --   --     Estimated Creatinine Clearance: 27.8 mL/min (A) (by C-G formula based on SCr of 2.69 mg/dL (H)).   Medical History: Past Medical History:  Diagnosis Date  . Diabetes mellitus without complication ([place])   . HTN (hypertension)      Assessment: 80 y/o M presents to the ED with weakness/fall. On heparin drip for afib. Plans for DCCV on Friday and to transition to Xarelto today -heparin level at goal, hg= 12.2 -SCr= 2.69, CrCl ~ 40 using TBW   Goal of Therapy:  Heparin level 0.3-0.7 units/ml Monitor platelets by anticoagulation protocol: Yes   Plan:  -Xarelto '15mg'$  po daily (start now) -d/c heparin   Hildred Laser, PharmD Clinical Pharmacist **Pharmacist phone directory can now be found on [email or web address] ([email or web address]).  Listed under [place].

## 2020-10-08 NOTE — Evaluation (Signed)
Physical Therapy Evaluation Patient Details Name: Kyle Hicks MRN: [id number] DOB: 1941-04-19 Today's Date: 10/08/2020   History of Present Illness  80 y.o. male presents to [facility] ED on 10/07/2020 after sustaining a fall at home. Pt reports weakness began 3/14. Chest x-ray significant for enlarged cardiac silhouette with a small right-sided pleural effusion. Pt also found to be tachy to 130s. PMH includes hypertension, diastolic CHF last EF [id number] with grade 1 diastolic dysfunction, diabetes mellitus type 2, and PUD with history of GI bleed.  Clinical Impression  Pt presents to PT with deficits in functional mobility, gait, balance, strength, power, and endurance. Pt requires assistance to perform bed mobility and transfer due to strength and power deficits at this time. Pt requires PT cueing for device management at this time as well. Pt will benefit from aggressive mobilization and acute PT services to improve strength and reduce falls risk. PT recommends discharge home with HHPT and a 3in1 commode. PT also recommends use of RW for all OOB mobility.    Follow Up Recommendations Home health PT;Supervision for mobility/OOB    Equipment Recommendations  3in1 (PT)    Recommendations for Other Services       Precautions / Restrictions Precautions Precautions: Fall Restrictions Weight Bearing Restrictions: No      Mobility  Bed Mobility Overal bed mobility: Needs Assistance Bed Mobility: Supine to Sit     Supine to sit: Min assist;HOB elevated     General bed mobility comments: increased time and use of rails    Transfers Overall transfer level: Needs assistance Equipment used: Rolling walker (2 wheeled);1 person hand held assist Transfers: Sit to/from [facility] Sit to Stand: Min assist;Min guard Stand pivot transfers: Min guard       General transfer comment: minA to power up from bed, minG with RW from recliner and BSC  Ambulation/Gait Ambulation/Gait  assistance: Min guard Gait Distance (Feet): 20 Feet Assistive device: Rolling walker (2 wheeled) Gait Pattern/deviations: Step-to pattern Gait velocity: reduced Gait velocity interpretation: <1.31 ft/sec, indicative of household ambulator General Gait Details: pt with short step-to gait, widened BOS, PT provides cues to maintain BOS within [employer]            Wheelchair Mobility    Modified Rankin (Stroke Patients Only)       Balance Overall balance assessment: Needs assistance Sitting-balance support: No upper extremity supported;Feet supported Sitting balance-[name] Scale: Fair     Standing balance support: Single extremity supported;Bilateral upper extremity supported Standing balance-[name] Scale: Poor Standing balance comment: reliant on BUE support of RW                             Pertinent Vitals/Pain Pain Assessment: No/denies pain    Home Living Family/patient expects to be discharged to:: Private residence Living Arrangements: Spouse/significant other Available Help at Discharge: Family;Available 24 hours/day Type of Home: House Home Access: Stairs to enter Entrance Stairs-Rails: Can reach both Entrance Stairs-Number of Steps: 4 Home Layout: One level Home Equipment: Walker - 2 wheels;Cane - single point      Prior Function Level of Independence: Independent               Hand Dominance        Extremity/Trunk Assessment   Upper Extremity Assessment Upper Extremity Assessment: Overall WFL for tasks assessed    Lower Extremity Assessment Lower Extremity Assessment: Generalized weakness    Cervical / Trunk Assessment Cervical /  Trunk Assessment: Normal  Communication   Communication: HOH  Cognition Arousal/Alertness: Awake/alert Behavior During Therapy: WFL for tasks assessed/performed Overall Cognitive Status: Impaired/Different from baseline Area of Impairment: Problem solving                              Problem Solving: Slow processing        General Comments General comments (skin integrity, edema, etc.): pt tachy at rest and throughout session in 130s    Exercises     Assessment/Plan    PT Assessment Patient needs continued PT services  PT Problem List Decreased strength;Decreased activity tolerance;Decreased balance;Decreased mobility;Decreased knowledge of use of DME;Decreased safety awareness;Decreased knowledge of precautions;Cardiopulmonary status limiting activity       PT Treatment Interventions DME instruction;Gait training;Stair training;Functional mobility training;Therapeutic activities;Balance training;Patient/family education    PT Goals (Current goals can be found in the Care Plan section)  Acute Rehab PT Goals Patient Stated Goal: To improve strength and return to independence PT Goal Formulation: With patient Time For Goal Achievement: 10/22/20 Potential to Achieve Goals: Good    Frequency Min 3X/week   Barriers to discharge        Co-evaluation               AM-PAC PT "6 Clicks" Mobility  Outcome Measure Help needed turning from your back to your side while in a flat bed without using bedrails?: A Little Help needed moving from lying on your back to sitting on the side of a flat bed without using bedrails?: A Little Help needed moving to and from a bed to a chair (including a wheelchair)?: A Little Help needed standing up from a chair using your arms (e.g., wheelchair or bedside chair)?: A Little Help needed to walk in hospital room?: A Little Help needed climbing 3-5 steps with a railing? : A Lot 6 Click Score: 17    End of Session   Activity Tolerance: Patient tolerated treatment well Patient left: in chair;with call bell/phone within reach;with chair alarm set Nurse Communication: Mobility status PT Visit Diagnosis: Muscle weakness (generalized) (M62.81);Unsteadiness on feet (R26.81);Other abnormalities of gait and mobility  (R26.89);History of falling (Z91.81)    Time: [id number] PT Time Calculation (min) (ACUTE ONLY): 34 min   Charges:   PT Evaluation $PT Eval Moderate Complexity: 1 Mod PT Treatments $Therapeutic Activity: 8-22 mins        Zenaida Niece, PT, DPT Acute Rehabilitation Pager: [phone]   Zenaida Niece 10/08/2020, 3:16 PM

## 2020-10-08 NOTE — Progress Notes (Signed)
Initially notified Dr Cyd Silence at [phone] regarding change in QRS morphology and obtained ekg.   Spoke with MD multiple times over course of night - 2nd EKG obtained, 1gm mag sulfate given, and continued to closely monitor patient.  At this time patient HR still 130 with normotensitve BP.   Patient is in no distress - no chest pain, heart palpitations or increased shortness of breath.   Will continue to closely monitor.

## 2020-10-08 NOTE — Progress Notes (Addendum)
Progress Note  Patient Name: Kyle Hicks Date of Encounter: 10/08/2020  [facility] Cardiologist: No primary care provider on file.   Subjective   Feeling better this morning. Breathing is better but still labored.   Inpatient Medications    Scheduled Meds: . atorvastatin  20 mg Oral Daily  . furosemide  40 mg Intravenous BID  . insulin aspart  0-6 Units Subcutaneous Q4H  . sodium chloride flush  3 mL Intravenous Q12H   Continuous Infusions: . sodium chloride    . amiodarone 30 mg/hr (10/08/20 [id number])  . heparin 1,600 Units/hr (10/08/20 0945)   PRN Meds: sodium chloride, acetaminophen, albuterol, dextrose, ondansetron (ZOFRAN) IV, sodium chloride flush   Vital Signs    Vitals:   10/08/20 0530 10/08/20 0600 10/08/20 0630 10/08/20 0758  BP:    (!) 121/93  Pulse:    (!) 130  Resp: (!) 25 (!) 28 (!) 21 20  Temp:    98.3 F (36.8 C)  TempSrc:    Oral  SpO2:    100%  Weight:      Height:        Intake/Output Summary (Last 24 hours) at 10/08/2020 1140 Last data filed at 10/08/2020 1035 Gross per 24 hour  Intake 972.49 ml  Output 300 ml  Net 672.49 ml   Last 3 Weights 10/08/2020 10/07/2020 04/14/2020  Weight (lbs) 260 lb 5.8 oz 246 lb 264 lb 9.6 oz  Weight (kg) 118.1 kg 111.585 kg 120.022 kg      Telemetry    Reads ST but suspect Aflutter rates 130s - Personally Reviewed  ECG    No new tracing this morning  Physical Exam   GEN: No acute distress but does have increased work of breathing. Neck: difficult to assess but does have + JVD Cardiac: Tachy, no murmurs, rubs, or gallops.  Respiratory: Clear to auscultation bilaterally. GI: Soft, nontender, non-distended  MS:  2+ bilateral LE edema; No deformity.  Left greater than right lower leg chronic venous stasis changes with pachydermic/skin discoloration appearance. Neuro:  Nonfocal  Psych: Normal affect   Labs    High Sensitivity Troponin:   Recent Labs  Lab 10/07/20 0229 10/07/20 0429   TROPONINIHS 75* 81*      Chemistry Recent Labs  Lab 10/07/20 0229 10/07/20 2011 10/08/20 0119  NA 139 135 137  K 5.1 5.1 5.1  CL 113* 112* 116*  CO2 13* 12* 12*  GLUCOSE 73 140* 117*  BUN 47* 49* 51*  CREATININE 2.78* 2.63* 2.69*  CALCIUM 8.7* 8.0* 8.2*  PROT 7.0  --   --   ALBUMIN 3.4*  --   --   AST 29  --   --   ALT 26  --   --   ALKPHOS 49  --   --   BILITOT 1.4*  --   --   GFRNONAA 23* 24* 24*  ANIONGAP '13 11 9     '$ Hematology Recent Labs  Lab 10/07/20 0229 10/08/20 0119  WBC 7.5 6.2  RBC 4.82 4.14*  HGB 13.5 12.2*  HCT 43.9 36.4*  MCV 91.1 87.9  MCH 28.0 29.5  MCHC 30.8 33.5  RDW 19.3* 19.2*  PLT 165 151    BNP Recent Labs  Lab 10/07/20 0229  BNP 4,299.0*     DDimer  Recent Labs  Lab 10/07/20 0229  DDIMER 4.72*     Radiology    NM Pulmonary Perfusion  Result Date: 10/07/2020 CLINICAL DATA:  Shortness of breath.  EXAM: NUCLEAR MEDICINE PERFUSION LUNG SCAN TECHNIQUE: Perfusion images were obtained in multiple projections after intravenous injection of radiopharmaceutical. Views: Anterior, posterior, left lateral, right lateral, RPO, LPO, RAO, LAO. RADIOPHARMACEUTICALS:  4.0 mCi Tc-58mMAA IV COMPARISON:  Chest radiograph October 07, 2020 FINDINGS: Radiotracer uptake is homogeneous and symmetric bilaterally. No perfusion defects evident. IMPRESSION: No perfusion defects evident. No findings indicative of pulmonary embolus. Electronically Signed   By: WLowella GripIII M.D.   On: 10/07/2020 11:02   DG Chest Portable 1 View  Result Date: 10/07/2020 CLINICAL DATA:  [place] short of breath, tachycardia EXAM: PORTABLE CHEST 1 VIEW COMPARISON:  02/23/2013 FINDINGS: Single frontal view of the chest demonstrates an enlarged cardiac silhouette. Small right pleural effusion obscures the costophrenic angle. No airspace disease or pneumothorax. No acute bony abnormalities. IMPRESSION: 1. Enlarged cardiac silhouette. 2. Small right pleural effusion.  Electronically Signed   By: MRanda NgoM.D.   On: 10/07/2020 02:53    Cardiac Studies   N/a upon initial evaluation   Patient Profile     80y.o. male with a hx of hypertension, diabetes mellitus, chronic diastolic dysfunction, peptic ulcer disease and prior GI bleed who was seen for the evaluation of tachycardia at the request of Dr. STamala Julian  Assessment & Plan    1. Suspected Atrial Flutter: Initially given IV adenosine on admission with no break in rhythm.  Monitor reveals a sinus tachycardia but suspect more likely atrial flutter as rates have been consistently in the 130s.  Was started on IV heparin on admission.  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  Will likely need restoration of sinus rhythm given his presentation of acute heart failure. --Plan to transition from IV heparin to Xarelto, discussed with Pharm.D. --Plan for TEE/DCCV on Friday morning after 2 doses of Xarelto.  This was discussed in detail with the patient at the bedside who is agreeable with plan. --We will continue loading with IV amiodarone for now, likely transition to p.o. post cardioversion --Consideration given to echo, but given elevated heart rate will defer for now  2.  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  BNP greater than 4000 on admission. --Has been on IV Lasix 40 mg twice daily, but sluggish output.  Will slightly increase to 60 mg twice daily with close following of renal function -- Does not appear decompensated at this time as he is warm and dry.  3.  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  Noted at 2.78 on admission.  Now down to 2.69 --Close monitoring of renal function with the need for diuresis  4.  Hypertension: Blood pressures have actually been soft.  Will hold on the addition of beta-blocker in the setting of acute heart failure.   5.  Elevated troponin: Suspect  demand ischemia in the setting of acute CHF as well as tachycardia and AKI   For questions or updates, please contact [facility] Please consult [email or web address] for contact info under        Signed, LReino Bellis NP  10/08/2020, 11:40 AM     ATTENDING ATTESTATION  I have seen, examined and evaluated the patient this PMalong with LReino Bellis NP.  After reviewing all the available data and chart, we discussed the patients laboratory, study & physical findings as well as symptoms in detail. I agree with her findings, examination as well as impression recommendations as per our discussion.    Attending  adjustments noted in italics.   The assessment plan noted above is totally based on our discussion.  I agree with the need to increase diuretic and the plan to set for TEE cardioversion as he continues to have difficult to control rates.  A. fib is very difficult to rate control but usually relatively simple for cardioversion.  He has not been tested with amiodarone.  Continue to load.  Would probably convert to oral tomorrow.  Blood pressure stabilizing out.  Unfortunately, we are not getting significant urine output.  I would like to stop all the IV fluids by converting to p.o. amiodarone in the morning.  We have converted on IV heparin to DOAC. Need to follow renal function closely.  Addendum: Echocardiogram ordered, unfortunately while in atrial flutter. ->  EF appears to be 10 to 15% with global HK.  Severely reduced RV function.  Moderate elevated PAP.  Moderate LA dilation.  Mild MR.  Significant elevated RAP. Very concerning finding, question how much of this is tachycardia mediated.  In the absence of active angina, still think any amount of atrial fibrillation makes sense.  But we do need to consider the timing of TEE cardioversion versus cath.  --> Will discuss with colleagues, I think probably the best option would be to go back to IV heparin in the morning, and consider  right left heart cath (he is only had 1 dose of Xarelto) in the afternoon.  Need to exclude ischemic etiology.  This needs to be done prior to cardioversion which would preclude the holding of Xarelto.  This would also help [place] direct further management with diuresis.      Glenetta Hew, M.D., M.S. Interventional Cardiologist   Pager # [phone] Phone # [phone] [address] [place], [place] [address]

## 2020-10-09 ENCOUNTER — Inpatient Hospital Stay: Payer: Self-pay

## 2020-10-09 ENCOUNTER — Inpatient Hospital Stay (HOSPITAL_COMMUNITY): Payer: Medicare HMO

## 2020-10-09 ENCOUNTER — Encounter (HOSPITAL_COMMUNITY): Payer: Self-pay | Admitting: Internal Medicine

## 2020-10-09 ENCOUNTER — Other Ambulatory Visit: Payer: Self-pay

## 2020-10-09 DIAGNOSIS — R197 Diarrhea, unspecified: Secondary | ICD-10-CM | POA: Diagnosis not present

## 2020-10-09 DIAGNOSIS — N179 Acute kidney failure, unspecified: Secondary | ICD-10-CM | POA: Diagnosis not present

## 2020-10-09 DIAGNOSIS — I5031 Acute diastolic (congestive) heart failure: Secondary | ICD-10-CM | POA: Diagnosis not present

## 2020-10-09 DIAGNOSIS — R7989 Other specified abnormal findings of blood chemistry: Secondary | ICD-10-CM | POA: Diagnosis not present

## 2020-10-09 DIAGNOSIS — I5021 Acute systolic (congestive) heart failure: Secondary | ICD-10-CM | POA: Diagnosis not present

## 2020-10-09 DIAGNOSIS — I4891 Unspecified atrial fibrillation: Secondary | ICD-10-CM | POA: Diagnosis not present

## 2020-10-09 LAB — HEPARIN LEVEL (UNFRACTIONATED)
Heparin Unfractionated: 1.58 IU/mL — ABNORMAL HIGH (ref 0.30–0.70)
Heparin Unfractionated: 1.08 IU/mL — ABNORMAL HIGH (ref 0.30–0.70)

## 2020-10-09 LAB — BASIC METABOLIC PANEL
Anion gap: 11 (ref 5–15)
BUN: 48 mg/dL — ABNORMAL HIGH (ref 8–23)
CO2: 13 mmol/L — ABNORMAL LOW (ref 22–32)
Calcium: 8.1 mg/dL — ABNORMAL LOW (ref 8.9–10.3)
Chloride: 113 mmol/L — ABNORMAL HIGH (ref 98–111)
Creatinine, Ser: 2.51 mg/dL — ABNORMAL HIGH (ref 0.61–1.24)
GFR, Estimated: 26 mL/min — ABNORMAL LOW (ref >60)
Glucose, Bld: 105 mg/dL — ABNORMAL HIGH (ref 70–99)
Potassium: 4.5 mmol/L (ref 3.5–5.1)
Sodium: 137 mmol/L (ref 135–145)

## 2020-10-09 LAB — CBC
HCT: 37.4 % — ABNORMAL LOW (ref 39.0–52.0)
Hemoglobin: 12.3 g/dL — ABNORMAL LOW (ref 13.0–17.0)
MCH: 28.3 pg (ref 26.0–34.0)
MCHC: 32.9 g/dL (ref 30.0–36.0)
MCV: 86 fL (ref 80.0–100.0)
Platelets: 105 10*3/uL — ABNORMAL LOW (ref 150–400)
RBC: 4.35 MIL/uL (ref 4.22–5.81)
RDW: 19.2 % — ABNORMAL HIGH (ref 11.5–15.5)
WBC: 6.1 10*3/uL (ref 4.0–10.5)
nRBC: 0 % (ref 0.0–0.2)

## 2020-10-09 LAB — GLUCOSE, CAPILLARY
Glucose-Capillary: 101 mg/dL — ABNORMAL HIGH (ref 70–99)
Glucose-Capillary: 103 mg/dL — ABNORMAL HIGH (ref 70–99)
Glucose-Capillary: 111 mg/dL — ABNORMAL HIGH (ref 70–99)
Glucose-Capillary: 114 mg/dL — ABNORMAL HIGH (ref 70–99)
Glucose-Capillary: 158 mg/dL — ABNORMAL HIGH (ref 70–99)
Glucose-Capillary: 162 mg/dL — ABNORMAL HIGH (ref 70–99)
Glucose-Capillary: 173 mg/dL — ABNORMAL HIGH (ref 70–99)

## 2020-10-09 LAB — COOXEMETRY PANEL
Carboxyhemoglobin: 0.6 % (ref 0.5–1.5)
Carboxyhemoglobin: 0.7 % (ref 0.5–1.5)
Methemoglobin: 0.8 % (ref 0.0–1.5)
Methemoglobin: 0.8 % (ref 0.0–1.5)
O2 Saturation: 40.1 %
O2 Saturation: 61.2 %
Total hemoglobin: 13.2 g/dL (ref 12.0–16.0)
Total hemoglobin: 11.1 g/dL — ABNORMAL LOW (ref 12.0–16.0)

## 2020-10-09 LAB — APTT: aPTT: 35 seconds (ref 24–36)

## 2020-10-09 MED ORDER — MILRINONE LACTATE IN DEXTROSE 20-5 MG/100ML-% IV SOLN
0.1250 ug/kg/min | INTRAVENOUS | Status: DC
  Administered 2020-10-09 – 2020-10-20 (×22): 0.25 ug/kg/min via INTRAVENOUS
  Administered 2020-10-20: 0.125 ug/kg/min via INTRAVENOUS
  Filled 2020-10-09 (×22): qty 100

## 2020-10-09 MED ORDER — CHLORHEXIDINE GLUCONATE CLOTH 2 % EX PADS
6.0000 | MEDICATED_PAD | Freq: Every day | CUTANEOUS | Status: DC
  Administered 2020-10-10 – 2020-10-16 (×7): 6 via TOPICAL

## 2020-10-09 MED ORDER — SODIUM CHLORIDE 0.9% FLUSH: 10.0000 mL | INTRAVENOUS | Status: DC | PRN

## 2020-10-09 MED ORDER — HEPARIN (PORCINE) 25000 UT/250ML-% IV SOLN
1600.0000 [IU]/h | INTRAVENOUS | Status: DC
  Administered 2020-10-09: 1600 [IU]/h via INTRAVENOUS
  Filled 2020-10-09 (×2): qty 250

## 2020-10-09 MED ORDER — PANTOPRAZOLE SODIUM 40 MG PO TBEC
40.0000 mg | DELAYED_RELEASE_TABLET | Freq: Every day | ORAL | Status: DC
  Administered 2020-10-10 – 2020-10-27 (×18): 40 mg via ORAL
  Filled 2020-10-09 (×18): qty 1

## 2020-10-09 MED ORDER — FUROSEMIDE 10 MG/ML IJ SOLN
80.0000 mg | Freq: Two times a day (BID) | INTRAMUSCULAR | Status: DC
  Administered 2020-10-09 – 2020-10-11 (×4): 80 mg via INTRAVENOUS
  Filled 2020-10-09 (×5): qty 8

## 2020-10-09 NOTE — Progress Notes (Signed)
   10/09/20 1101  Mobility  Activity Refused mobility (unspecified reasons)

## 2020-10-09 NOTE — Progress Notes (Signed)
PROGRESS NOTE    [name]  [id number] DOB: May 13, 1941 DOA: 10/07/2020 PCP: Sharilyn Sites, MD    Brief Narrative:  Kyle Hicks is a 80 y.o. male with medical history significant of hypertension, diastolic CHF last EF [id number] with grade 1 diastolic dysfunction, diabetes mellitus type 2, and PUD with history of GI bleed presents after having a fall at home.  He reports that he had not been feeling well for the last 2 days.  This morning when he got up to use the restroom he reports that his left leg got weak and he fell to his side off the couch.  He did not hit his head or lose consciousness.  Denies any change in speech, vision, sensation.  He feels globally weak.  Noted associated symptoms of diarrhea, possibly some weight loss, leg swelling, shortness of breath, intermittent wheezing, and palpitations.  Patient has not noticed any blood or dark stools, but he had been getting up to have a stool every 25-30 minutes recently.  Denies having any significant cough, calf/leg pain, or orthopnea symptoms.  Wife notes that he had been very weak yesterday and stayed in the bathroom all day.  He had not been eating well and had been drinking Ensure recently.  3/17-monitor with a flutter RVR in the 130s  Consultants:   cardiology  Procedures:   Antimicrobials:       Subjective: Reports feeling okay.  Denies shortness of breath or chest pain or dizziness.  Objective: Vitals:   10/09/20 0000 10/09/20 0005 10/09/20 0430 10/09/20 0500  BP: 110/77  110/81   Pulse: (!) 129  (!) 131   Resp: (!) 21  18   Temp: 97.7 F (36.5 C)  97.6 F (36.4 C)   TempSrc: Oral  Oral   SpO2: 99%  99%   Weight:  117.6 kg  117.6 kg  Height:        Intake/Output Summary (Last 24 hours) at 10/09/2020 0824 Last data filed at 10/09/2020 0454 Gross per 24 hour  Intake 716 ml  Output 1050 ml  Net -334 ml   Filed Weights   10/08/20 0300 10/09/20 0005 10/09/20 0500  Weight: 118.1 kg 117.6 kg 117.6 kg     Examination: Lying in bed, calm cta with decrease bs at bases Reg tachy, s1/s2 Soft benign +bs +edema  Awake and alert Mood and affect appropriate in current setting    Data Reviewed: I have personally reviewed following labs and imaging studies  CBC: Recent Labs  Lab 10/07/20 0229 10/08/20 0119 10/09/20 0404  WBC 7.5 6.2 6.1  NEUTROABS 5.9  --   --   HGB 13.5 12.2* 12.3*  HCT 43.9 36.4* 37.4*  MCV 91.1 87.9 86.0  PLT 165 151 123456*   Basic Metabolic Panel: Recent Labs  Lab 10/07/20 0229 10/07/20 2011 10/08/20 0119 10/09/20 0404  NA 139 135 137 137  K 5.1 5.1 5.1 4.5  CL 113* 112* 116* 113*  CO2 13* 12* 12* 13*  GLUCOSE 73 140* 117* 105*  BUN 47* 49* 51* 48*  CREATININE 2.78* 2.63* 2.69* 2.51*  CALCIUM 8.7* 8.0* 8.2* 8.1*  MG 1.9 1.7  --   --    GFR: Estimated Creatinine Clearance: 29.7 mL/min (A) (by C-G formula based on SCr of 2.51 mg/dL (H)). Liver Function Tests: Recent Labs  Lab 10/07/20 0229  AST 29  ALT 26  ALKPHOS 49  BILITOT 1.4*  PROT 7.0  ALBUMIN 3.4*   No results for input(s):  LIPASE, AMYLASE in the last 168 hours. No results for input(s): AMMONIA in the last 168 hours. Coagulation Profile: No results for input(s): INR, PROTIME in the last 168 hours. Cardiac Enzymes: No results for input(s): CKTOTAL, CKMB, CKMBINDEX, TROPONINI in the last 168 hours. BNP (last 3 results) No results for input(s): PROBNP in the last 8760 hours. HbA1C: Recent Labs    10/07/20 1300  HGBA1C 5.9*   CBG: Recent Labs  Lab 10/08/20 1621 10/08/20 2033 10/09/20 0006 10/09/20 0450 10/09/20 0728  GLUCAP 107* 138* 114* 103* 101*   Lipid Profile: No results for input(s): CHOL, HDL, LDLCALC, TRIG, CHOLHDL, LDLDIRECT in the last 72 hours. Thyroid Function Tests: Recent Labs    10/07/20 1300  TSH 0.720   Anemia Panel: No results for input(s): VITAMINB12, FOLATE, FERRITIN, TIBC, IRON, RETICCTPCT in the last 72 hours. Sepsis Labs: Recent Labs  Lab  10/07/20 0229 10/07/20 0429  LATICACIDVEN 2.0* 1.8    Recent Results (from the past 240 hour(s))  Resp Panel by RT-PCR (Flu A&B, Covid) Nasopharyngeal Swab     Status: None   Collection Time: 10/07/20  3:07 AM   Specimen: Nasopharyngeal Swab; Nasopharyngeal(NP) swabs in vial transport medium  Result Value Ref Range Status   SARS Coronavirus 2 by RT PCR NEGATIVE NEGATIVE Final    Comment: (NOTE) SARS-CoV-2 target nucleic acids are NOT DETECTED.  The SARS-CoV-2 RNA is generally detectable in upper respiratory specimens during the acute phase of infection. The lowest concentration of SARS-CoV-2 viral copies this assay can detect is 138 copies/mL. A negative result does not preclude SARS-Cov-2 infection and should not be used as the sole basis for treatment or other patient management decisions. A negative result may occur with  improper specimen collection/handling, submission of specimen other than nasopharyngeal swab, presence of viral mutation(s) within the areas targeted by this assay, and inadequate number of viral copies(<138 copies/mL). A negative result must be combined with clinical observations, patient history, and epidemiological information. The expected result is Negative.  Fact Sheet for Patients:  EntrepreneurPulse.com.au  Fact Sheet for Healthcare Providers:  IncredibleEmployment.be  This test is no t yet approved or cleared by the [place] FDA and  has been authorized for detection and/or diagnosis of SARS-CoV-2 by FDA under an Emergency Use Authorization (EUA). This EUA will remain  in effect (meaning this test can be used) for the duration of the COVID-19 declaration under Section 564(b)(1) of the Act, 21 U.S.C.section 360bbb-3(b)(1), unless the authorization is terminated  or revoked sooner.       Influenza A by PCR NEGATIVE NEGATIVE Final   Influenza B by PCR NEGATIVE NEGATIVE Final    Comment: (NOTE) The Xpert  Xpress SARS-CoV-2/FLU/RSV plus assay is intended as an aid in the diagnosis of influenza from Nasopharyngeal swab specimens and should not be used as a sole basis for treatment. Nasal washings and aspirates are unacceptable for Xpert Xpress SARS-CoV-2/FLU/RSV testing.  Fact Sheet for Patients: EntrepreneurPulse.com.au  Fact Sheet for Healthcare Providers: IncredibleEmployment.be  This test is not yet approved or cleared by the [place] FDA and has been authorized for detection and/or diagnosis of SARS-CoV-2 by FDA under an Emergency Use Authorization (EUA). This EUA will remain in effect (meaning this test can be used) for the duration of the COVID-19 declaration under Section 564(b)(1) of the Act, 21 U.S.C. section 360bbb-3(b)(1), unless the authorization is terminated or revoked.  Performed at [facility], [place] [address], [place], [place] [address]  Radiology Studies: NM Pulmonary Perfusion  Result Date: 10/07/2020 CLINICAL DATA:  Shortness of breath. EXAM: NUCLEAR MEDICINE PERFUSION LUNG SCAN TECHNIQUE: Perfusion images were obtained in multiple projections after intravenous injection of radiopharmaceutical. Views: Anterior, posterior, left lateral, right lateral, RPO, LPO, RAO, LAO. RADIOPHARMACEUTICALS:  4.0 mCi Tc-43mMAA IV COMPARISON:  Chest radiograph October 07, 2020 FINDINGS: Radiotracer uptake is homogeneous and symmetric bilaterally. No perfusion defects evident. IMPRESSION: No perfusion defects evident. No findings indicative of pulmonary embolus. Electronically Signed   By: WLowella GripIII M.D.   On: 10/07/2020 11:02   ECHOCARDIOGRAM COMPLETE  Result Date: 10/08/2020    ECHOCARDIOGRAM REPORT   Patient Name:   WBEREN LINNEMANNMIMS Date of Exam: 10/08/2020 Medical Rec #:  [id number]     Height:       66.0 in Accession #:    [id number]    Weight:       260.4 lb Date of Birth:  4Apr 16, 1942     BSA:          2.237 m  Patient Age:    [age]years       BP:           117/91 mmHg Patient Gender: M              HR:           131 bpm. Exam Location:  Inpatient Procedure: 2D Echo, Cardiac Doppler, Color Doppler and Intracardiac            Opacification Agent Indications:    I50.40* Unspecified combined systolic (congestive) and diastolic                 (congestive) heart failure  History:        Patient has prior history of Echocardiogram examinations, most                 recent 04/25/2017. Abnormal ECG, Arrythmias:Atrial Fibrillation,                 Signs/Symptoms:Chest Pain; Risk Factors:Dyslipidemia, Diabetes                 and Hypertension. Elevated troponin.  Sonographer:    TRoseanna RainbowRDCS Referring Phys: [id number] A SMITH  Sonographer Comments: Technically difficult study due to poor echo windows and patient is morbidly obese. Image acquisition challenging due to patient body habitus. IMPRESSIONS  1. Left ventricular ejection fraction, by estimation, is 10-15%. The left ventricle has severely decreased function. The left ventricle demonstrates global hypokinesis. There is mild concentric left ventricular hypertrophy. Left ventricular diastolic function could not be evaluated.  2. Right ventricular systolic function is severely reduced. The right ventricular size is mildly enlarged. There is moderately elevated pulmonary artery systolic pressure. The estimated right ventricular systolic pressure is 4123456mmHg.  3. Left atrial size was moderately dilated.  4. The mitral valve is grossly normal. Mild mitral valve regurgitation. No evidence of mitral stenosis.  5. The aortic valve is tricuspid. Aortic valve regurgitation is mild. No aortic stenosis is present.  6. 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  7. The inferior vena cava is dilated in size with <50% respiratory variability, suggesting right atrial pressure of 15 mmHg. Comparison(s): Changes from prior study are noted. EF now severely reduced.  Afib with RVR now present. FINDINGS  Left Ventricle: Left ventricular ejection fraction, by estimation, is 10-15%. The left ventricle has severely decreased function. The left ventricle  demonstrates global hypokinesis. Definity contrast agent was given IV to delineate the left ventricular endocardial borders. The left ventricular internal cavity size was normal in size. There is mild concentric left ventricular hypertrophy. Left ventricular diastolic function could not be evaluated due to atrial fibrillation. Left ventricular diastolic function could not be evaluated. Right Ventricle: The right ventricular size is mildly enlarged. No increase in right ventricular wall thickness. Right ventricular systolic function is severely reduced. There is moderately elevated pulmonary artery systolic pressure. The tricuspid regurgitant velocity is 2.92 m/s, and with an assumed right atrial pressure of 15 mmHg, the estimated right ventricular systolic pressure is 123456 mmHg. Left Atrium: Left atrial size was moderately dilated. Right Atrium: Right atrial size was normal in size. Pericardium: Trivial pericardial effusion is present. Mitral Valve: The mitral valve is grossly normal. Mild mitral valve regurgitation. No evidence of mitral valve stenosis. Tricuspid Valve: The tricuspid valve is grossly normal. Tricuspid valve regurgitation is mild . No evidence of tricuspid stenosis. Aortic Valve: The aortic valve is tricuspid. Aortic valve regurgitation is mild. No aortic stenosis is present. Pulmonic Valve: The pulmonic valve was grossly normal. Pulmonic valve regurgitation is trivial. No evidence of pulmonic stenosis. Aorta: The aortic root is normal in size and structure. 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 Venous: The inferior vena cava is dilated in size with less than 50% respiratory variability, suggesting right atrial pressure of 15 mmHg. IAS/Shunts: The atrial septum is grossly normal. EKG:  Rhythm strip during this exam demostrated atrial fibrillation.  LEFT VENTRICLE PLAX 2D LVIDd:         5.40 cm LVIDs:         5.10 cm LV PW:         1.60 cm LV IVS:        1.20 cm LVOT diam:     2.10 cm LV SV:         41 LV SV Index:   18 LVOT Area:     3.46 cm  RIGHT VENTRICLE            IVC RV S prime:     9.67 cm/s  IVC diam: 3.10 cm TAPSE (M-mode): 0.7 cm LEFT ATRIUM              Index       RIGHT ATRIUM           Index LA diam:        3.90 cm  1.74 cm/m  RA Area:     22.40 cm LA Vol (A2C):   129.0 ml 57.67 ml/m RA Volume:   66.40 ml  29.68 ml/m LA Vol (A4C):   86.8 ml  38.80 ml/m LA Biplane Vol: 109.0 ml 48.73 ml/m  AORTIC VALVE LVOT Vmax:   80.90 cm/s LVOT Vmean:  55.100 cm/s LVOT VTI:    0.117 m  AORTA Ao Root diam: 3.60 cm Ao Asc diam:  4.30 cm MITRAL VALVE                 TRICUSPID VALVE MV Area (PHT): 6.52 cm      TR Peak grad:   34.1 mmHg MV Decel Time: 116 msec      TR Vmax:        292.00 cm/s MR Peak grad:    71.6 mmHg MR Mean grad:    44.0 mmHg   SHUNTS MR Vmax:         423.00 cm/s Systemic VTI:  0.12 m MR Vmean:        316.0 cm/s  Systemic Diam: 2.10 cm MR PISA:         0.25 cm MR PISA Eff ROA: 2 mm MR PISA Radius:  0.20 cm MV E velocity: 119.33 cm/s Eleonore Chiquito MD Electronically signed by Eleonore Chiquito MD Signature Date/Time: 10/08/2020/5:30:08 PM    Final         Scheduled Meds: . atorvastatin  20 mg Oral Daily  . furosemide  60 mg Intravenous BID  . insulin aspart  0-6 Units Subcutaneous Q4H  . sodium chloride flush  3 mL Intravenous Q12H   Continuous Infusions: . sodium chloride    . amiodarone 30 mg/hr (10/08/20 2201)    Assessment & Plan:   Principal Problem:   Acute CHF (congestive heart failure) (HCC) Active Problems:   Acute kidney injury superimposed on chronic kidney disease (HCC)   Essential hypertension   Elevated d-dimer   Obesity (BMI 30-39.9)   Diarrhea   Acute biventricular H failure Echo with EF now 10-15% RV severely reduced  BNP  elevated Cardiology following picc placed for cvp monitoring Continue iv lasix, increased to '80mg'$  bid Unna boot added No bb/arb/dig/aldac. With aki    Elevated D-dimer: Acute.  On admission D-dimer elevated at 4.72.  Unable to obtain a CT angiogram of the chest with contrast due to kidney function. -Heparin drip per pharmacy -Check VQ scan once able 3/16-will obtain b/l LE venous US 3/17-venous US negative dvt   Acute kidney injury superimposed on chronic kidney disease stage IIIb: Patient presents with creatinine elevated up to 2.78 with BUN 47.  Baseline creatinine previously have been around 1.5.   Suspect hypoperfusion due to tachycardia and heart failure exacerbation. 3/17-improving slowly with diuresis Continue iv lasix Hold nephrotoxic meds   Aflutter rvr Rate not controlled Continue iv amiodarone Continue heparin gtt Plan TEE CV when diuresed well     Fall and generalized weakness: Patient presents after having a fall at home.  He reportedly has been having diarrhea all day yesterday which may be a contributing factor to his weakness. PT  Diabetes mellitus type 2, hypoglycemia: Acute.  On admission glucose 63.  Last hemoglobin A1c 6.5 in 03/2020.  Home medications include glipizide 5 mg daily, and Janumet 25- 500 mg tablet twice daily. 3/17-BG controlled. Continue riss continue to Hold glipizide     Diarrhea: Acute.  Patient has not recently been on antibiotics, but reportedly has intermittent episodes of diarrhea. -Monitor intake and output -Consider checking C. difficile if diarrhea persist 3/17-none reported  Essential hypertension: On admission blood pressures were initially noted to be soft as 91/63. -Discontinued nifedipine per cardiology Hyperlipidemia -Continue atorvastatin 20 mg nightly  Prolonged QT interval: QTC 516  -Avoid QT prolonging medications  Obesity   DVT prophylaxis: heparin Code Status:FULL Family Communication: none at  bedside  Status is: Inpatient  Remains inpatient appropriate because:Inpatient level of care appropriate due to severity of illness   Dispo: The patient is from: Home              Anticipated d/c is to: Home              Patient currently is not medically stable to d/c.   Difficult to place patient No  Need CV, still in aflutter rvr , still needs iv meds          LOS: 2 days   Time spent: 35 min with >50% on coc  Nolberto Hanlon, MD [facility] Pager [phone]  If 7PM-7AM, please contact night-coverage 10/09/2020, 8:24 AM

## 2020-10-09 NOTE — H&P (View-Only) (Signed)
Advanced Heart Failure Team Consult Note   Primary Physician: Sharilyn Sites, MD PCP-Cardiologist:  No primary care provider on file.  Reason for Consultation: Heart Failure   HPI:    Kyle Hicks is seen today for evaluation of heart failure at the request of Dr Ellyn Hack.   Kyle Hicks is a 80 year old with history of HTN, DMII, chronic diastolic heart failure, PUD, and GI bleed.   Had ECHO 2018 and EF was 55%.   Presented to [facility] 10/07/20 with weakness and diarrhea. Family called EMS. He was tachycardic and brought to [facility]. EKG on arrival showed A flutter 130s. CXR with smoall right pleural effusion and enlarged cardiac silhouette and small right pleural effusion. VQ was negative was negative for PE.  BNP > 4000. Lactic acid 2, HS Trop 75>81, and  creatinine 2.8.  Given IV lasix + 5 mg IV metoprolol. Cardiology consulted. Placed on amiodarone drip and diuresing with IV lasix 60 mg twice a day. Sluggish urine output noted.   Creatinine 2.8> 2.5  Remains SOB with exertion.   Echo 10/08/20 - EF 10-15% RV severely reduced. IVC dilated suggesting RA pressure 15.   Review of Systems: [y] = yes, _0  = no   . General: Weight gain [Y ]; Weight loss _1 ; Anorexia _2 ; Fatigue [Y ]; Fever _3 ; Chills _4 ; Weakness [Y ]  . Cardiac: Chest pain/pressure _5 ; Resting SOB _6 ; Exertional SOB [Y ]; Orthopnea [ Y]; Pedal Edema [Y ]; Palpitations _7 ; Syncope _8 ; Presyncope _9 ; Paroxysmal nocturnal dyspnea_10   . Pulmonary: Cough _11 ; Wheezing_12 ; Hemoptysis_13 ; Sputum _14 ; Snoring _15   . GI: Vomiting_16 ; Dysphagia_17 ; Melena_18 ; Hematochezia _19 ; Heartburn_20 ; Abdominal pain _21 ; Constipation _22 ; Diarrhea _23 ; BRBPR _24   . GU: Hematuria_25 ; Dysuria _26 ; Nocturia_27   . Vascular: Pain in legs with walking _28 ; Pain in feet with lying flat _29 ; Non-healing sores _30 ; Stroke _31 ; TIA _32 ; Slurred speech _33 ;  . Neuro: Headaches_34 ; Vertigo_35 ; Seizures_36 ; Paresthesias_37 ;Blurred vision _38 ; Diplopia _39 ;  Vision changes _40   . Ortho/Skin: Arthritis _41 ; Joint pain [Y ]; Muscle pain _42 ; Joint swelling _43 ; Back Pain [Y ]; Rash _44   . Psych: Depression_45 ; Anxiety_46   . Heme: Bleeding problems _47 ; Clotting disorders _48 ; Anemia _49   . Endocrine: Diabetes [Y ]; Thyroid dysfunction_50   Home Medications Prior to Admission medications   Medication Sig Start Date End Date Taking? Authorizing Provider  Artificial Tear Ointment (DRY EYES OP) Apply 1 drop to eye daily as needed (for dry eyes).   Yes [provider]  atorvastatin (LIPITOR) 20 MG tablet Take 20 mg by mouth daily.   Yes [provider]  glipiZIDE (GLUCOTROL) 5 MG tablet Take 1 tablet by mouth once daily with breakfast Patient taking differently: Take 5 mg by mouth daily before breakfast. 07/16/20  Yes Nida, Marella Chimes, MD  Homeopathic Products [facility]) OINT Apply 1 application topically daily as needed (foot care).   Yes [provider]  NIFEdipine (PROCARDIA-XL/ADALAT-CC/NIFEDICAL-XL) 30 MG 24 hr tablet take 1 tablet by mouth once daily Patient taking differently: Take 30 mg by mouth daily. 02/14/17  Yes Cassandria Anger, MD  ramipril (ALTACE) 10 MG capsule Take 1 capsule by mouth once daily Patient taking differently: Take 10 mg  by mouth daily. 10/09/19  Yes Nida, Marella Chimes, MD  sitaGLIPtin-metformin (JANUMET) 50-1000 MG tablet Take 1/2 (one-half) tablet by mouth twice daily Patient taking differently: Take 0.5 tablets by mouth 2 (two) times daily with a meal. 12/10/19  Yes Nida, Marella Chimes, MD  blood glucose meter kit and supplies KIT Dispense based on patient and insurance preference. Use up to 2 times daily as directed. (FOR ICD-10 E11.65) 04/12/18   Cassandria Anger, MD  glucose blood (ACCU-CHEK GUIDE) test strip Use as instructed bid E11.65 02/17/18   Cassandria Anger, MD  hydrochlorothiazide (HYDRODIURIL) 25 MG tablet TAKE 1 TABLET(25 MG) BY MOUTH DAILY Patient taking  differently: Take 25 mg by mouth daily. 06/16/18   Cassandria Anger, MD  Lancets (ACCU-CHEK MULTICLIX) lancets Use as instructed bid. E11.65 02/17/18   Cassandria Anger, MD    Past Medical History: Past Medical History:  Diagnosis Date  . Diabetes mellitus without complication ([place])   . Diastolic CHF, acute ([place]) 10/08/2020  . HTN (hypertension)     Past Surgical History: Past Surgical History:  Procedure Laterality Date  . ESOPHAGOGASTRODUODENOSCOPY N/A 04/27/2017   Procedure: ESOPHAGOGASTRODUODENOSCOPY (EGD);  Surgeon: Rogene Houston, MD;  Location: [facility];  Service: Endoscopy;  Laterality: N/A;    Family History: Family History  Problem Relation Age of Onset  . Hypertension Mother   . Hyperlipidemia Mother   . Hypertension Father   . Hyperlipidemia Father   . Colon cancer Neg Hx     Social History: Social History   Socioeconomic History  . Marital status: Married    Spouse name: Not on file  . Number of children: Not on file  . Years of education: Not on file  . Highest education level: Not on file  Occupational History  . Not on file  Tobacco Use  . Smoking status: Former Smoker    Packs/day: 0.50    Types: Cigarettes    Quit date: 07/26/1987    Years since quitting: 33.2  . Smokeless tobacco: Never Used  Vaping Use  . Vaping Use: Never used  Substance and Sexual Activity  . Alcohol use: No    Alcohol/week: 0.0 standard drinks    Comment: Previously drank socially, last drank 25-30 years ago  . Drug use: No  . Sexual activity: Not on file  Other Topics Concern  . Not on file  Social History Narrative  . Not on file   Social Determinants of Health   Financial Resource Strain: Not on file  Food Insecurity: Not on file  Transportation Needs: Not on file  Physical Activity: Not on file  Stress: Not on file  Social Connections: Not on file    Allergies:  No Known Allergies  Objective:    Vital Signs:   Temp:  [97.6 F (36.4  C)-98.4 F (36.9 C)] 97.6 F (36.4 C) (03/17 0430) Pulse Rate:  [129-132] 131 (03/17 0430) Resp:  [17-21] 18 (03/17 0430) BP: (95-117)/(72-91) 110/81 (03/17 0430) SpO2:  [97 %-100 %] 99 % (03/17 0430) Weight:  [117.6 kg] 117.6 kg (03/17 0500) Last BM Date: 10/07/20  Weight change: Filed Weights   10/08/20 0300 10/09/20 0005 10/09/20 0500  Weight: 118.1 kg 117.6 kg 117.6 kg    Intake/Output:   Intake/Output Summary (Last 24 hours) at 10/09/2020 0904 Last data filed at 10/09/2020 0454 Gross per 24 hour  Intake 716 ml  Output 1050 ml  Net -334 ml      Physical Exam  General: No resp difficulty HEENT: normal Neck: supple. JVP to jaw  . Carotids 2+ bilat; no bruits. No lymphadenopathy or thyromegaly appreciated. Cor: PMI nondisplaced. Regular rate & rhythm. No rubs, gallops or murmurs. Lungs: clear Abdomen: soft, nontender, nondistended. No hepatosplenomegaly. No bruits or masses. Good bowel sounds. Extremities: no cyanosis, clubbing, rash, R and LLE 2+ edema Neuro: alert & orientedx3, cranial nerves grossly intact. moves all 4 extremities w/o difficulty. Affect pleasant  Telemetry    A flutter 130s personally reviewed.   EKG    A flutter 130 on admit   Labs   Basic Metabolic Panel: Recent Labs  Lab 10/07/20 0229 10/07/20 2011 10/08/20 0119 10/09/20 0404  NA 139 135 137 137  K 5.1 5.1 5.1 4.5  CL 113* 112* 116* 113*  CO2 13* 12* 12* 13*  GLUCOSE 73 140* 117* 105*  BUN 47* 49* 51* 48*  CREATININE 2.78* 2.63* 2.69* 2.51*  CALCIUM 8.7* 8.0* 8.2* 8.1*  MG 1.9 1.7  --   --     Liver Function Tests: Recent Labs  Lab 10/07/20 0229  AST 29  ALT 26  ALKPHOS 49  BILITOT 1.4*  PROT 7.0  ALBUMIN 3.4*   No results for input(s): LIPASE, AMYLASE in the last 168 hours. No results for input(s): AMMONIA in the last 168 hours.  CBC: Recent Labs  Lab 10/07/20 0229 10/08/20 0119 10/09/20 0404  WBC 7.5 6.2 6.1  NEUTROABS 5.9  --   --   HGB 13.5 12.2*  12.3*  HCT 43.9 36.4* 37.4*  MCV 91.1 87.9 86.0  PLT 165 151 105*    Cardiac Enzymes: No results for input(s): CKTOTAL, CKMB, CKMBINDEX, TROPONINI in the last 168 hours.  BNP: BNP (last 3 results) Recent Labs    10/07/20 0229  BNP 4,299.0*    ProBNP (last 3 results) No results for input(s): PROBNP in the last 8760 hours.   CBG: Recent Labs  Lab 10/08/20 1621 10/08/20 2033 10/09/20 0006 10/09/20 0450 10/09/20 0728  GLUCAP 107* 138* 114* 103* 101*    Coagulation Studies: No results for input(s): LABPROT, INR in the last 72 hours.   Imaging   ECHOCARDIOGRAM COMPLETE  Result Date: 10/08/2020    ECHOCARDIOGRAM REPORT   Patient Name:   Kyle Hicks [place] Date of Exam: 10/08/2020 Medical Rec #:  [id number]      Height:       66.0 in Accession #:    [id number]     Weight:       260.4 lb Date of Birth:  [birth year]      BSA:          2.237 m Patient Age:    66 years       BP:           117/91 mmHg Patient Gender: M              HR:           131 bpm. Exam Location:  Inpatient Procedure: 2D Echo, Cardiac Doppler, Color Doppler and Intracardiac            Opacification Agent Indications:    I50.40* Unspecified combined systolic (congestive) and diastolic                 (congestive) heart failure  History:        Patient has prior history of Echocardiogram examinations, most                 recent  04/25/2017. Abnormal ECG, Arrythmias:Atrial Fibrillation,                 Signs/Symptoms:Chest Pain; Risk Factors:Dyslipidemia, Diabetes                 and Hypertension. Elevated troponin.  Sonographer:    Roseanna Rainbow RDCS Referring Phys: [id number] RONDELL A SMITH  Sonographer Comments: Technically difficult study due to poor echo windows and patient is morbidly obese. Image acquisition challenging due to patient body habitus. IMPRESSIONS  1. Left ventricular ejection fraction, by estimation, is 10-15%. The left ventricle has severely decreased function. The left ventricle demonstrates global hypokinesis.  There is mild concentric left ventricular hypertrophy. Left ventricular diastolic function could not be evaluated.  2. Right ventricular systolic function is severely reduced. The right ventricular size is mildly enlarged. There is moderately elevated pulmonary artery systolic pressure. The estimated right ventricular systolic pressure is 58.3 mmHg.  3. Left atrial size was moderately dilated.  4. The mitral valve is grossly normal. Mild mitral valve regurgitation. No evidence of mitral stenosis.  5. The aortic valve is tricuspid. Aortic valve regurgitation is mild. No aortic stenosis is present.  6. 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  7. The inferior vena cava is dilated in size with <50% respiratory variability, suggesting right atrial pressure of 15 mmHg. Comparison(s): Changes from prior study are noted. EF now severely reduced. Afib with RVR now present. FINDINGS  Left Ventricle: Left ventricular ejection fraction, by estimation, is 10-15%. The left ventricle has severely decreased function. The left ventricle demonstrates global hypokinesis. Definity contrast agent was given IV to delineate the left ventricular endocardial borders. The left ventricular internal cavity size was normal in size. There is mild concentric left ventricular hypertrophy. Left ventricular diastolic function could not be evaluated due to atrial fibrillation. Left ventricular diastolic function could not be evaluated. Right Ventricle: The right ventricular size is mildly enlarged. No increase in right ventricular wall thickness. Right ventricular systolic function is severely reduced. There is moderately elevated pulmonary artery systolic pressure. The tricuspid regurgitant velocity is 2.92 m/s, and with an assumed right atrial pressure of 15 mmHg, the estimated right ventricular systolic pressure is 09.4 mmHg. Left Atrium: Left atrial size was moderately dilated. Right Atrium: Right atrial size was normal  in size. Pericardium: Trivial pericardial effusion is present. Mitral Valve: The mitral valve is grossly normal. Mild mitral valve regurgitation. No evidence of mitral valve stenosis. Tricuspid Valve: The tricuspid valve is grossly normal. Tricuspid valve regurgitation is mild . No evidence of tricuspid stenosis. Aortic Valve: The aortic valve is tricuspid. Aortic valve regurgitation is mild. No aortic stenosis is present. Pulmonic Valve: The pulmonic valve was grossly normal. Pulmonic valve regurgitation is trivial. No evidence of pulmonic stenosis. Aorta: The aortic root is normal in size and structure. 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 Venous: The inferior vena cava is dilated in size with less than 50% respiratory variability, suggesting right atrial pressure of 15 mmHg. IAS/Shunts: The atrial septum is grossly normal. EKG: Rhythm strip during this exam demostrated atrial fibrillation.  LEFT VENTRICLE PLAX 2D LVIDd:         5.40 cm LVIDs:         5.10 cm LV PW:         1.60 cm LV IVS:        1.20 cm LVOT diam:     2.10 cm LV SV:  41 LV SV Index:   18 LVOT Area:     3.46 cm  RIGHT VENTRICLE            IVC RV S prime:     9.67 cm/s  IVC diam: 3.10 cm TAPSE (M-mode): 0.7 cm LEFT ATRIUM              Index       RIGHT ATRIUM           Index LA diam:        3.90 cm  1.74 cm/m  RA Area:     22.40 cm LA Vol (A2C):   129.0 ml 57.67 ml/m RA Volume:   66.40 ml  29.68 ml/m LA Vol (A4C):   86.8 ml  38.80 ml/m LA Biplane Vol: 109.0 ml 48.73 ml/m  AORTIC VALVE LVOT Vmax:   80.90 cm/s LVOT Vmean:  55.100 cm/s LVOT VTI:    0.117 m  AORTA Ao Root diam: 3.60 cm Ao Asc diam:  4.30 cm MITRAL VALVE                 TRICUSPID VALVE MV Area (PHT): 6.52 cm      TR Peak grad:   34.1 mmHg MV Decel Time: 116 msec      TR Vmax:        292.00 cm/s Kyle Peak grad:    71.6 mmHg Kyle Mean grad:    44.0 mmHg   SHUNTS Kyle Vmax:         423.00 cm/s Systemic VTI:  0.12 m Kyle Vmean:        316.0 cm/s  Systemic  Diam: 2.10 cm Kyle PISA:         0.25 cm Kyle PISA Eff ROA: 2 mm Kyle PISA Radius:  0.20 cm MV E velocity: 119.33 cm/s Eleonore Chiquito MD Electronically signed by Eleonore Chiquito MD Signature Date/Time: 10/08/2020/5:30:08 PM    Final    VAS [place] LOWER EXTREMITY VENOUS (DVT)  Result Date: 10/09/2020  Lower Venous DVT Study Indications: Edema, and elevated D-dimer.  Risk Factors: CHF, CKD3. Limitations: Body habitus, poor ultrasound/tissue interface and Difficulty getting color fill likely due to habitus. Comparison Study: No previous exams Performing Technologist: Rogelia Rohrer  Examination Guidelines: A complete evaluation includes B-mode imaging, spectral Doppler, color Doppler, and power Doppler as needed of all accessible portions of each vessel. Bilateral testing is considered an integral part of a complete examination. Limited examinations for reoccurring indications may be performed as noted. The reflux portion of the exam is performed with the patient in reverse Trendelenburg.  +---------+---------------+---------+-----------+----------+--------------+ RIGHT    CompressibilityPhasicitySpontaneityPropertiesThrombus Aging +---------+---------------+---------+-----------+----------+--------------+ CFV      Full           Yes      Yes                                 +---------+---------------+---------+-----------+----------+--------------+ SFJ      Full                                                        +---------+---------------+---------+-----------+----------+--------------+ FV Prox  Full           Yes      Yes                                 +---------+---------------+---------+-----------+----------+--------------+  FV Mid   Full           Yes      Yes                                 +---------+---------------+---------+-----------+----------+--------------+ FV DistalFull           Yes      Yes                                  +---------+---------------+---------+-----------+----------+--------------+ PFV      Full                                                        +---------+---------------+---------+-----------+----------+--------------+ POP      Full           Yes      Yes                                 +---------+---------------+---------+-----------+----------+--------------+ PTV      Full                                                        +---------+---------------+---------+-----------+----------+--------------+ PERO     Full                                                        +---------+---------------+---------+-----------+----------+--------------+   +---------+---------------+---------+-----------+----------+--------------+ LEFT     CompressibilityPhasicitySpontaneityPropertiesThrombus Aging +---------+---------------+---------+-----------+----------+--------------+ CFV      Full           Yes      Yes                                 +---------+---------------+---------+-----------+----------+--------------+ SFJ      Full                                                        +---------+---------------+---------+-----------+----------+--------------+ FV Prox  Full           Yes      Yes                                 +---------+---------------+---------+-----------+----------+--------------+ FV Mid   Full           Yes      Yes                                 +---------+---------------+---------+-----------+----------+--------------+ FV DistalFull  Yes      Yes                                 +---------+---------------+---------+-----------+----------+--------------+ PFV      Full                                                        +---------+---------------+---------+-----------+----------+--------------+ POP      Full           Yes      Yes                                  +---------+---------------+---------+-----------+----------+--------------+ PTV      Full                                                        +---------+---------------+---------+-----------+----------+--------------+ PERO     Full                                                        +---------+---------------+---------+-----------+----------+--------------+     Summary: BILATERAL: - No evidence of deep vein thrombosis seen in the lower extremities, bilaterally. - No evidence of superficial venous thrombosis in the lower extremities, bilaterally. -No evidence of popliteal cyst, bilaterally.   *See table(s) above for measurements and observations.    Preliminary    [place] EKG SITE RITE  Result Date: 10/09/2020 If Site Rite image not attached, placement could not be confirmed due to current cardiac rhythm.     Medications:     Current Medications: . atorvastatin  20 mg Oral Daily  . furosemide  60 mg Intravenous BID  . insulin aspart  0-6 Units Subcutaneous Q4H  . sodium chloride flush  3 mL Intravenous Q12H     Infusions: . sodium chloride    . amiodarone 30 mg/hr (10/08/20 2201)        Assessment/Plan   1. A Flutter RVR -Started on amiodarone drip. Remains tachycardic with rates > 130  - TSH normal - Increase amio to 60 mg per hour - Add heparin drip  - Set up for TEE/DC-CV once adequately diuresed.   2. Acute Biventricular Heart Failure  - Had ECHO 2018 and EF 55%. Now EF shows severe biventricular HF with EF < 15%.  BNP > 4000 . Suspect tachy-mediated. HS Trop - no trend. No chest pain. No LHC for now. Could consider ischemic work up later if renal function improves.  - PICC placed. Follow CVP and CO-OX. - Appears volume overloaded on exam and would not be surprised if CO-OX is low. If CO-OX low will add milrinone. - No bb for now - Increase lasix to 80 mg twice a day  - No bb/arb/dig/spiro with AKI  - Add unna boots.   3. AKI on CKD Stage  IIIa -Creatinine on admit 2.8.  Most recent creatinine was  1.55 back in 03/2020. - Suspect this is due to cardiogenic shock.  - Has not been taking NSAIDs.  - Creatinine 2.8>2.5  - Follow renal function daily  4. DMII Hgb A1C 5.9  On SSI  5. Lower Extremity Edema -Dopplers negative for DVT  6. 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  -Add PPI  7. HTN  -Low today but looking back he is normally hypertensive.   Length of Stay: 2  Darrick Grinder, NP  10/09/2020, 9:04 AM  Advanced Heart Failure Team Pager [phone] (M-F; 7a - 5p)  Please contact [facility] for night-coverage after hours (4p -7a ) and weekends on [email or web address]   Patient seen and examined with the above-signed Advanced Practice Provider and/or Housestaff. I personally reviewed laboratory data, imaging studies and relevant notes. I independently examined the patient and formulated the important aspects of the plan. I have edited the note to reflect any of my changes or salient points. I have personally discussed the plan with the patient and/or family.  80 y/o male with obesity and HTN. Admitted with acute HF in setting of AF with RVR of unknown duration.   Echo reviewed personally and shows severe biventricular dysfunction.   PICC placed with co-ox 40% Suggesting markedly low output. CVP up  General:  Obese male No resp difficulty HEENT: normal Neck: supple.JVP to ear . Carotids 2+ bilat; no bruits. No lymphadenopathy or thryomegaly appreciated. Cor: PMI nondisplaced. Tachy irregular +s3 Lungs: basilar crackles  Abdomen: soft, nontender, nondistended. No hepatosplenomegaly. No bruits or masses. Good bowel sounds. Extremities: no cyanosis, clubbing, rash, 2+ edema  Chronic venous stasis changed. Cool  Neuro: alert & orientedx3, cranial nerves grossly intact. moves all 4 extremities w/o difficulty. Affect pleasant   He has low output HF in setting of AF with RVR. Suspect tachy CM but may also have component of iCM.  Continue IV amio. Start heparin. Start milrinone. Will need further stabilization prior to TEE & DC-CV. Low threshold to move to ICU.   CRITICAL CARE Performed by: Glori Bickers  Total critical care time: 45 minutes  Critical care time was exclusive of separately billable procedures and treating other patients.  Critical care was necessary to treat or prevent imminent or life-threatening deterioration.  Critical care was time spent personally by me (independent of midlevel providers or residents) on the following activities: development of treatment plan with patient and/or surrogate as well as nursing, discussions with consultants, evaluation of patient's response to treatment, examination of patient, obtaining history from patient or surrogate, ordering and performing treatments and interventions, ordering and review of laboratory studies, ordering and review of radiographic studies, pulse oximetry and re-evaluation of patient's condition.   Glori Bickers, MD  4:26 PM

## 2020-10-09 NOTE — TOC Benefit Eligibility Note (Signed)
Patient Teacher, English as a foreign language completed.    The patient is currently admitted and upon discharge could be taking Xarelto 15 mg.  The current 30 day co-pay is, $47.00.   The patient is insured through [place], [place] Patient Advocate Specialist [place] Team Direct Number: [phone]  Fax: [phone]

## 2020-10-09 NOTE — Progress Notes (Signed)
[place] for Xarelto>Heparin Indication: atrial fibrillation  No Known Allergies  Patient Measurements: Height: '5\' 6"'$  (167.6 cm) Weight: 117.6 kg (259 lb 4.2 oz) IBW/kg (Calculated) : 63.8 Heparin Dosing Weight: 89.3 kg  Vital Signs: Temp: 97.6 F (36.4 C) (03/17 1120) Temp Source: Oral (03/17 1120) BP: 102/83 (03/17 1120) Pulse Rate: 132 (03/17 1120)  Labs: Recent Labs    10/07/20 0229 10/07/20 0429 10/07/20 1520 10/07/20 2011 10/08/20 0119 10/08/20 1124 10/09/20 0404  HGB 13.5  --   --   --  12.2*  --  12.3*  HCT 43.9  --   --   --  36.4*  --  37.4*  PLT 165  --   --   --  151  --  105*  HEPARINUNFRC  --   --  0.19*  --  0.13* 0.60  --   CREATININE 2.78*  --   --  2.63* 2.69*  --  2.51*  TROPONINIHS 75* 81*  --   --   --   --   --     Estimated Creatinine Clearance: 29.7 mL/min (A) (by C-G formula based on SCr of 2.51 mg/dL (H)).   Medical History: Past Medical History:  Diagnosis Date  . Diabetes mellitus without complication ([place])   . Diastolic CHF, acute ([place]) 10/08/2020  . HTN (hypertension)      Assessment: 80 yo M with new Afib with RVR. No AC noted PTA. Was started on heparin then switched to Xarelto 3/16 and received 1 dose. Pharmacy asked to switch back to IV heparin. Planning for DCCV 3/18. PICC line placed today. CBC stable with Hgb 12.3, pltc down 151>105. No overt bleeding noted.   Goal of Therapy:  Heparin level 0.3-0.7 units/ml aPTT 66-102 seconds Monitor platelets by anticoagulation protocol: Yes   Plan:  Restart heparin at prior rate 1600 units/hr Check baseline and 8-hr HL and aPTT Monitor daily aPTT, HL, CBC, s/sx bleeding  Richardine Service, PharmD, BCPS PGY2 Cardiology Pharmacy Resident Phone: [phone] 10/09/2020  1:13 PM  Please check [email or web address] for unit-specific pharmacy phone numbers.

## 2020-10-09 NOTE — Progress Notes (Signed)
PT Cancellation Note  Patient Details Name: Kyle Hicks MRN: [id number] DOB: Dec 03, 1940   Cancelled Treatment:    Reason Eval/Treat Not Completed: Patient at procedure or test/unavailable. PT attempted to see pt 3x this morning. Pt refuses twice and is in sterile procedure for third attempt. PT will follow up as time allows.   Zenaida Niece 10/09/2020, 10:21 AM

## 2020-10-09 NOTE — Progress Notes (Signed)
Orthopedic Tech Progress Note Patient Details:  JADIR RALPH 12-13-1940 [id number]  Ortho Devices Type of Ortho Device: Louretta Parma boot Ortho Device/Splint Location: BLE Ortho Device/Splint Interventions: Ordered,Application,Adjustment   Post Interventions Patient Tolerated: Well Instructions Provided: Care of device,Poper ambulation with device   Raylie Maddison 10/09/2020, 2:11 PM

## 2020-10-09 NOTE — Progress Notes (Signed)
Peripherally Inserted Central Catheter Placement  The IV Nurse has discussed with the patient and/or persons authorized to consent for the patient, the purpose of this procedure and the potential benefits and risks involved with this procedure.  The benefits include less needle sticks, lab draws from the catheter, and the patient may be discharged home with the catheter. Risks include, but not limited to, infection, bleeding, blood clot (thrombus formation), and puncture of an artery; nerve damage and irregular heartbeat and possibility to perform a PICC exchange if needed/ordered by physician.  Alternatives to this procedure were also discussed.  Bard Power PICC patient education guide, fact sheet on infection prevention and patient information card has been provided to patient /or left at bedside.    PICC Placement Documentation  PICC Double Lumen [id number] PICC Right Basilic 43 cm 0 cm (Active)  Indication for Insertion or Continuance of Line Vasoactive infusions 10/09/20 1018  Exposed Catheter (cm) 0 cm 10/09/20 1018  Site Assessment Dry;Clean;Intact 10/09/20 1018  Lumen #1 Status Flushed;Blood return noted;Saline locked 10/09/20 1018  Lumen #2 Status Flushed;Blood return noted;Saline locked 10/09/20 1018  Dressing Type Transparent 10/09/20 1018  Dressing Status Clean;Dry;Intact 10/09/20 1018  Antimicrobial disc in place? Yes 10/09/20 1018  Dressing Change Due 10/16/20 10/09/20 1018       Stacyann Mcconaughy Ramos 10/09/2020, 10:25 AM

## 2020-10-09 NOTE — Discharge Instructions (Addendum)

## 2020-10-09 NOTE — Consult Note (Addendum)
Advanced Heart Failure Team Consult Note   Primary Physician: Sharilyn Sites, MD PCP-Cardiologist:  No primary care provider on file.  Reason for Consultation: Heart Failure   HPI:    Kyle Hicks is seen today for evaluation of heart failure at the request of Dr Ellyn Hack.   Kyle Hicks is a 80 year old with history of HTN, DMII, chronic diastolic heart failure, PUD, and GI bleed.   Had ECHO 2018 and EF was 55%.   Presented to [facility] 10/07/20 with weakness and diarrhea. Family called EMS. He was tachycardic and brought to [facility]. EKG on arrival showed A flutter 130s. CXR with smoall right pleural effusion and enlarged cardiac silhouette and small right pleural effusion. VQ was negative was negative for PE.  BNP > 4000. Lactic acid 2, HS Trop 75>81, and  creatinine 2.8.  Given IV lasix + 5 mg IV metoprolol. Cardiology consulted. Placed on amiodarone drip and diuresing with IV lasix 60 mg twice a day. Sluggish urine output noted.   Creatinine 2.8> 2.5  Remains SOB with exertion.   Echo 10/08/20 - EF 10-15% RV severely reduced. IVC dilated suggesting RA pressure 15.   Review of Systems: [y] = yes, _0  = no   . General: Weight gain [Y ]; Weight loss _1 ; Anorexia _2 ; Fatigue [Y ]; Fever _3 ; Chills _4 ; Weakness [Y ]  . Cardiac: Chest pain/pressure _5 ; Resting SOB _6 ; Exertional SOB [Y ]; Orthopnea [ Y]; Pedal Edema [Y ]; Palpitations _7 ; Syncope _8 ; Presyncope _9 ; Paroxysmal nocturnal dyspnea_10   . Pulmonary: Cough _11 ; Wheezing_12 ; Hemoptysis_13 ; Sputum _14 ; Snoring _15   . GI: Vomiting_16 ; Dysphagia_17 ; Melena_18 ; Hematochezia _19 ; Heartburn_20 ; Abdominal pain _21 ; Constipation _22 ; Diarrhea _23 ; BRBPR _24   . GU: Hematuria_25 ; Dysuria _26 ; Nocturia_27   . Vascular: Pain in legs with walking _28 ; Pain in feet with lying flat _29 ; Non-healing sores _30 ; Stroke _31 ; TIA _32 ; Slurred speech _33 ;  . Neuro: Headaches_34 ; Vertigo_35 ; Seizures_36 ; Paresthesias_37 ;Blurred vision _38 ; Diplopia _39 ;  Vision changes _40   . Ortho/Skin: Arthritis _41 ; Joint pain [Y ]; Muscle pain _42 ; Joint swelling _43 ; Back Pain [Y ]; Rash _44   . Psych: Depression_45 ; Anxiety_46   . Heme: Bleeding problems _47 ; Clotting disorders _48 ; Anemia _49   . Endocrine: Diabetes [Y ]; Thyroid dysfunction_50   Home Medications Prior to Admission medications   Medication Sig Start Date End Date Taking? Authorizing Provider  Artificial Tear Ointment (DRY EYES OP) Apply 1 drop to eye daily as needed (for dry eyes).   Yes [provider]  atorvastatin (LIPITOR) 20 MG tablet Take 20 mg by mouth daily.   Yes [provider]  glipiZIDE (GLUCOTROL) 5 MG tablet Take 1 tablet by mouth once daily with breakfast Patient taking differently: Take 5 mg by mouth daily before breakfast. 07/16/20  Yes Nida, Marella Chimes, MD  Homeopathic Products [facility]) OINT Apply 1 application topically daily as needed (foot care).   Yes [provider]  NIFEdipine (PROCARDIA-XL/ADALAT-CC/NIFEDICAL-XL) 30 MG 24 hr tablet take 1 tablet by mouth once daily Patient taking differently: Take 30 mg by mouth daily. 02/14/17  Yes Cassandria Anger, MD  ramipril (ALTACE) 10 MG capsule Take 1 capsule by mouth once daily Patient taking differently: Take 10 mg  by mouth daily. 10/09/19  Yes Nida, Marella Chimes, MD  sitaGLIPtin-metformin (JANUMET) 50-1000 MG tablet Take 1/2 (one-half) tablet by mouth twice daily Patient taking differently: Take 0.5 tablets by mouth 2 (two) times daily with a meal. 12/10/19  Yes Nida, Marella Chimes, MD  blood glucose meter kit and supplies KIT Dispense based on patient and insurance preference. Use up to 2 times daily as directed. (FOR ICD-10 E11.65) 04/12/18   Cassandria Anger, MD  glucose blood (ACCU-CHEK GUIDE) test strip Use as instructed bid E11.65 02/17/18   Cassandria Anger, MD  hydrochlorothiazide (HYDRODIURIL) 25 MG tablet TAKE 1 TABLET(25 MG) BY MOUTH DAILY Patient taking  differently: Take 25 mg by mouth daily. 06/16/18   Cassandria Anger, MD  Lancets (ACCU-CHEK MULTICLIX) lancets Use as instructed bid. E11.65 02/17/18   Cassandria Anger, MD    Past Medical History: Past Medical History:  Diagnosis Date  . Diabetes mellitus without complication ([place])   . Diastolic CHF, acute ([place]) 10/08/2020  . HTN (hypertension)     Past Surgical History: Past Surgical History:  Procedure Laterality Date  . ESOPHAGOGASTRODUODENOSCOPY N/A 04/27/2017   Procedure: ESOPHAGOGASTRODUODENOSCOPY (EGD);  Surgeon: Rogene Houston, MD;  Location: [facility];  Service: Endoscopy;  Laterality: N/A;    Family History: Family History  Problem Relation Age of Onset  . Hypertension Mother   . Hyperlipidemia Mother   . Hypertension Father   . Hyperlipidemia Father   . Colon cancer Neg Hx     Social History: Social History   Socioeconomic History  . Marital status: Married    Spouse name: Not on file  . Number of children: Not on file  . Years of education: Not on file  . Highest education level: Not on file  Occupational History  . Not on file  Tobacco Use  . Smoking status: Former Smoker    Packs/day: 0.50    Types: Cigarettes    Quit date: 07/26/1987    Years since quitting: 33.2  . Smokeless tobacco: Never Used  Vaping Use  . Vaping Use: Never used  Substance and Sexual Activity  . Alcohol use: No    Alcohol/week: 0.0 standard drinks    Comment: Previously drank socially, last drank 25-30 years ago  . Drug use: No  . Sexual activity: Not on file  Other Topics Concern  . Not on file  Social History Narrative  . Not on file   Social Determinants of Health   Financial Resource Strain: Not on file  Food Insecurity: Not on file  Transportation Needs: Not on file  Physical Activity: Not on file  Stress: Not on file  Social Connections: Not on file    Allergies:  No Known Allergies  Objective:    Vital Signs:   Temp:  [97.6 F (36.4  C)-98.4 F (36.9 C)] 97.6 F (36.4 C) (03/17 0430) Pulse Rate:  [129-132] 131 (03/17 0430) Resp:  [17-21] 18 (03/17 0430) BP: (95-117)/(72-91) 110/81 (03/17 0430) SpO2:  [97 %-100 %] 99 % (03/17 0430) Weight:  [117.6 kg] 117.6 kg (03/17 0500) Last BM Date: 10/07/20  Weight change: Filed Weights   10/08/20 0300 10/09/20 0005 10/09/20 0500  Weight: 118.1 kg 117.6 kg 117.6 kg    Intake/Output:   Intake/Output Summary (Last 24 hours) at 10/09/2020 0904 Last data filed at 10/09/2020 0454 Gross per 24 hour  Intake 716 ml  Output 1050 ml  Net -334 ml      Physical Exam  General: No resp difficulty HEENT: normal Neck: supple. JVP to jaw  . Carotids 2+ bilat; no bruits. No lymphadenopathy or thyromegaly appreciated. Cor: PMI nondisplaced. Regular rate & rhythm. No rubs, gallops or murmurs. Lungs: clear Abdomen: soft, nontender, nondistended. No hepatosplenomegaly. No bruits or masses. Good bowel sounds. Extremities: no cyanosis, clubbing, rash, R and LLE 2+ edema Neuro: alert & orientedx3, cranial nerves grossly intact. moves all 4 extremities w/o difficulty. Affect pleasant  Telemetry    A flutter 130s personally reviewed.   EKG    A flutter 130 on admit   Labs   Basic Metabolic Panel: Recent Labs  Lab 10/07/20 0229 10/07/20 2011 10/08/20 0119 10/09/20 0404  NA 139 135 137 137  K 5.1 5.1 5.1 4.5  CL 113* 112* 116* 113*  CO2 13* 12* 12* 13*  GLUCOSE 73 140* 117* 105*  BUN 47* 49* 51* 48*  CREATININE 2.78* 2.63* 2.69* 2.51*  CALCIUM 8.7* 8.0* 8.2* 8.1*  MG 1.9 1.7  --   --     Liver Function Tests: Recent Labs  Lab 10/07/20 0229  AST 29  ALT 26  ALKPHOS 49  BILITOT 1.4*  PROT 7.0  ALBUMIN 3.4*   No results for input(s): LIPASE, AMYLASE in the last 168 hours. No results for input(s): AMMONIA in the last 168 hours.  CBC: Recent Labs  Lab 10/07/20 0229 10/08/20 0119 10/09/20 0404  WBC 7.5 6.2 6.1  NEUTROABS 5.9  --   --   HGB 13.5 12.2*  12.3*  HCT 43.9 36.4* 37.4*  MCV 91.1 87.9 86.0  PLT 165 151 105*    Cardiac Enzymes: No results for input(s): CKTOTAL, CKMB, CKMBINDEX, TROPONINI in the last 168 hours.  BNP: BNP (last 3 results) Recent Labs    10/07/20 0229  BNP 4,299.0*    ProBNP (last 3 results) No results for input(s): PROBNP in the last 8760 hours.   CBG: Recent Labs  Lab 10/08/20 1621 10/08/20 2033 10/09/20 0006 10/09/20 0450 10/09/20 0728  GLUCAP 107* 138* 114* 103* 101*    Coagulation Studies: No results for input(s): LABPROT, INR in the last 72 hours.   Imaging   ECHOCARDIOGRAM COMPLETE  Result Date: 10/08/2020    ECHOCARDIOGRAM REPORT   Patient Name:   Kyle Hicks [place] Date of Exam: 10/08/2020 Medical Rec #:  [id number]      Height:       66.0 in Accession #:    [id number]     Weight:       260.4 lb Date of Birth:  [birth year]      BSA:          2.237 m Patient Age:    66 years       BP:           117/91 mmHg Patient Gender: M              HR:           131 bpm. Exam Location:  Inpatient Procedure: 2D Echo, Cardiac Doppler, Color Doppler and Intracardiac            Opacification Agent Indications:    I50.40* Unspecified combined systolic (congestive) and diastolic                 (congestive) heart failure  History:        Patient has prior history of Echocardiogram examinations, most                 recent  04/25/2017. Abnormal ECG, Arrythmias:Atrial Fibrillation,                 Signs/Symptoms:Chest Pain; Risk Factors:Dyslipidemia, Diabetes                 and Hypertension. Elevated troponin.  Sonographer:    Roseanna Rainbow RDCS Referring Phys: [id number] RONDELL A SMITH  Sonographer Comments: Technically difficult study due to poor echo windows and patient is morbidly obese. Image acquisition challenging due to patient body habitus. IMPRESSIONS  1. Left ventricular ejection fraction, by estimation, is 10-15%. The left ventricle has severely decreased function. The left ventricle demonstrates global hypokinesis.  There is mild concentric left ventricular hypertrophy. Left ventricular diastolic function could not be evaluated.  2. Right ventricular systolic function is severely reduced. The right ventricular size is mildly enlarged. There is moderately elevated pulmonary artery systolic pressure. The estimated right ventricular systolic pressure is 58.3 mmHg.  3. Left atrial size was moderately dilated.  4. The mitral valve is grossly normal. Mild mitral valve regurgitation. No evidence of mitral stenosis.  5. The aortic valve is tricuspid. Aortic valve regurgitation is mild. No aortic stenosis is present.  6. 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  7. The inferior vena cava is dilated in size with <50% respiratory variability, suggesting right atrial pressure of 15 mmHg. Comparison(s): Changes from prior study are noted. EF now severely reduced. Afib with RVR now present. FINDINGS  Left Ventricle: Left ventricular ejection fraction, by estimation, is 10-15%. The left ventricle has severely decreased function. The left ventricle demonstrates global hypokinesis. Definity contrast agent was given IV to delineate the left ventricular endocardial borders. The left ventricular internal cavity size was normal in size. There is mild concentric left ventricular hypertrophy. Left ventricular diastolic function could not be evaluated due to atrial fibrillation. Left ventricular diastolic function could not be evaluated. Right Ventricle: The right ventricular size is mildly enlarged. No increase in right ventricular wall thickness. Right ventricular systolic function is severely reduced. There is moderately elevated pulmonary artery systolic pressure. The tricuspid regurgitant velocity is 2.92 m/s, and with an assumed right atrial pressure of 15 mmHg, the estimated right ventricular systolic pressure is 09.4 mmHg. Left Atrium: Left atrial size was moderately dilated. Right Atrium: Right atrial size was normal  in size. Pericardium: Trivial pericardial effusion is present. Mitral Valve: The mitral valve is grossly normal. Mild mitral valve regurgitation. No evidence of mitral valve stenosis. Tricuspid Valve: The tricuspid valve is grossly normal. Tricuspid valve regurgitation is mild . No evidence of tricuspid stenosis. Aortic Valve: The aortic valve is tricuspid. Aortic valve regurgitation is mild. No aortic stenosis is present. Pulmonic Valve: The pulmonic valve was grossly normal. Pulmonic valve regurgitation is trivial. No evidence of pulmonic stenosis. Aorta: The aortic root is normal in size and structure. 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 Venous: The inferior vena cava is dilated in size with less than 50% respiratory variability, suggesting right atrial pressure of 15 mmHg. IAS/Shunts: The atrial septum is grossly normal. EKG: Rhythm strip during this exam demostrated atrial fibrillation.  LEFT VENTRICLE PLAX 2D LVIDd:         5.40 cm LVIDs:         5.10 cm LV PW:         1.60 cm LV IVS:        1.20 cm LVOT diam:     2.10 cm LV SV:  41 LV SV Index:   18 LVOT Area:     3.46 cm  RIGHT VENTRICLE            IVC RV S prime:     9.67 cm/s  IVC diam: 3.10 cm TAPSE (M-mode): 0.7 cm LEFT ATRIUM              Index       RIGHT ATRIUM           Index LA diam:        3.90 cm  1.74 cm/m  RA Area:     22.40 cm LA Vol (A2C):   129.0 ml 57.67 ml/m RA Volume:   66.40 ml  29.68 ml/m LA Vol (A4C):   86.8 ml  38.80 ml/m LA Biplane Vol: 109.0 ml 48.73 ml/m  AORTIC VALVE LVOT Vmax:   80.90 cm/s LVOT Vmean:  55.100 cm/s LVOT VTI:    0.117 m  AORTA Ao Root diam: 3.60 cm Ao Asc diam:  4.30 cm MITRAL VALVE                 TRICUSPID VALVE MV Area (PHT): 6.52 cm      TR Peak grad:   34.1 mmHg MV Decel Time: 116 msec      TR Vmax:        292.00 cm/s Kyle Peak grad:    71.6 mmHg Kyle Mean grad:    44.0 mmHg   SHUNTS Kyle Vmax:         423.00 cm/s Systemic VTI:  0.12 m Kyle Vmean:        316.0 cm/s  Systemic  Diam: 2.10 cm Kyle PISA:         0.25 cm Kyle PISA Eff ROA: 2 mm Kyle PISA Radius:  0.20 cm MV E velocity: 119.33 cm/s Eleonore Chiquito MD Electronically signed by Eleonore Chiquito MD Signature Date/Time: 10/08/2020/5:30:08 PM    Final    VAS [place] LOWER EXTREMITY VENOUS (DVT)  Result Date: 10/09/2020  Lower Venous DVT Study Indications: Edema, and elevated D-dimer.  Risk Factors: CHF, CKD3. Limitations: Body habitus, poor ultrasound/tissue interface and Difficulty getting color fill likely due to habitus. Comparison Study: No previous exams Performing Technologist: Rogelia Rohrer  Examination Guidelines: A complete evaluation includes B-mode imaging, spectral Doppler, color Doppler, and power Doppler as needed of all accessible portions of each vessel. Bilateral testing is considered an integral part of a complete examination. Limited examinations for reoccurring indications may be performed as noted. The reflux portion of the exam is performed with the patient in reverse Trendelenburg.  +---------+---------------+---------+-----------+----------+--------------+ RIGHT    CompressibilityPhasicitySpontaneityPropertiesThrombus Aging +---------+---------------+---------+-----------+----------+--------------+ CFV      Full           Yes      Yes                                 +---------+---------------+---------+-----------+----------+--------------+ SFJ      Full                                                        +---------+---------------+---------+-----------+----------+--------------+ FV Prox  Full           Yes      Yes                                 +---------+---------------+---------+-----------+----------+--------------+  FV Mid   Full           Yes      Yes                                 +---------+---------------+---------+-----------+----------+--------------+ FV DistalFull           Yes      Yes                                  +---------+---------------+---------+-----------+----------+--------------+ PFV      Full                                                        +---------+---------------+---------+-----------+----------+--------------+ POP      Full           Yes      Yes                                 +---------+---------------+---------+-----------+----------+--------------+ PTV      Full                                                        +---------+---------------+---------+-----------+----------+--------------+ PERO     Full                                                        +---------+---------------+---------+-----------+----------+--------------+   +---------+---------------+---------+-----------+----------+--------------+ LEFT     CompressibilityPhasicitySpontaneityPropertiesThrombus Aging +---------+---------------+---------+-----------+----------+--------------+ CFV      Full           Yes      Yes                                 +---------+---------------+---------+-----------+----------+--------------+ SFJ      Full                                                        +---------+---------------+---------+-----------+----------+--------------+ FV Prox  Full           Yes      Yes                                 +---------+---------------+---------+-----------+----------+--------------+ FV Mid   Full           Yes      Yes                                 +---------+---------------+---------+-----------+----------+--------------+ FV DistalFull  Yes      Yes                                 +---------+---------------+---------+-----------+----------+--------------+ PFV      Full                                                        +---------+---------------+---------+-----------+----------+--------------+ POP      Full           Yes      Yes                                  +---------+---------------+---------+-----------+----------+--------------+ PTV      Full                                                        +---------+---------------+---------+-----------+----------+--------------+ PERO     Full                                                        +---------+---------------+---------+-----------+----------+--------------+     Summary: BILATERAL: - No evidence of deep vein thrombosis seen in the lower extremities, bilaterally. - No evidence of superficial venous thrombosis in the lower extremities, bilaterally. -No evidence of popliteal cyst, bilaterally.   *See table(s) above for measurements and observations.    Preliminary    [place] EKG SITE RITE  Result Date: 10/09/2020 If Site Rite image not attached, placement could not be confirmed due to current cardiac rhythm.     Medications:     Current Medications: . atorvastatin  20 mg Oral Daily  . furosemide  60 mg Intravenous BID  . insulin aspart  0-6 Units Subcutaneous Q4H  . sodium chloride flush  3 mL Intravenous Q12H     Infusions: . sodium chloride    . amiodarone 30 mg/hr (10/08/20 2201)        Assessment/Plan   1. A Flutter RVR -Started on amiodarone drip. Remains tachycardic with rates > 130  - TSH normal - Increase amio to 60 mg per hour - Add heparin drip  - Set up for TEE/DC-CV once adequately diuresed.   2. Acute Biventricular Heart Failure  - Had ECHO 2018 and EF 55%. Now EF shows severe biventricular HF with EF < 15%.  BNP > 4000 . Suspect tachy-mediated. HS Trop - no trend. No chest pain. No LHC for now. Could consider ischemic work up later if renal function improves.  - PICC placed. Follow CVP and CO-OX. - Appears volume overloaded on exam and would not be surprised if CO-OX is low. If CO-OX low will add milrinone. - No bb for now - Increase lasix to 80 mg twice a day  - No bb/arb/dig/spiro with AKI  - Add unna boots.   3. AKI on CKD Stage  IIIa -Creatinine on admit 2.8.  Most recent creatinine was  1.55 back in 03/2020. - Suspect this is due to cardiogenic shock.  - Has not been taking NSAIDs.  - Creatinine 2.8>2.5  - Follow renal function daily  4. DMII Hgb A1C 5.9  On SSI  5. Lower Extremity Edema -Dopplers negative for DVT  6. 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  -Add PPI  7. HTN  -Low today but looking back he is normally hypertensive.   Length of Stay: 2  Darrick Grinder, NP  10/09/2020, 9:04 AM  Advanced Heart Failure Team Pager [phone] (M-F; 7a - 5p)  Please contact [facility] for night-coverage after hours (4p -7a ) and weekends on [email or web address]   Patient seen and examined with the above-signed Advanced Practice Provider and/or Housestaff. I personally reviewed laboratory data, imaging studies and relevant notes. I independently examined the patient and formulated the important aspects of the plan. I have edited the note to reflect any of my changes or salient points. I have personally discussed the plan with the patient and/or family.  80 y/o male with obesity and HTN. Admitted with acute HF in setting of AF with RVR of unknown duration.   Echo reviewed personally and shows severe biventricular dysfunction.   PICC placed with co-ox 40% Suggesting markedly low output. CVP up  General:  Obese male No resp difficulty HEENT: normal Neck: supple.JVP to ear . Carotids 2+ bilat; no bruits. No lymphadenopathy or thryomegaly appreciated. Cor: PMI nondisplaced. Tachy irregular +s3 Lungs: basilar crackles  Abdomen: soft, nontender, nondistended. No hepatosplenomegaly. No bruits or masses. Good bowel sounds. Extremities: no cyanosis, clubbing, rash, 2+ edema  Chronic venous stasis changed. Cool  Neuro: alert & orientedx3, cranial nerves grossly intact. moves all 4 extremities w/o difficulty. Affect pleasant   He has low output HF in setting of AF with RVR. Suspect tachy CM but may also have component of iCM.  Continue IV amio. Start heparin. Start milrinone. Will need further stabilization prior to TEE & DC-CV. Low threshold to move to ICU.   CRITICAL CARE Performed by: Glori Bickers  Total critical care time: 45 minutes  Critical care time was exclusive of separately billable procedures and treating other patients.  Critical care was necessary to treat or prevent imminent or life-threatening deterioration.  Critical care was time spent personally by me (independent of midlevel providers or residents) on the following activities: development of treatment plan with patient and/or surrogate as well as nursing, discussions with consultants, evaluation of patient's response to treatment, examination of patient, obtaining history from patient or surrogate, ordering and performing treatments and interventions, ordering and review of laboratory studies, ordering and review of radiographic studies, pulse oximetry and re-evaluation of patient's condition.   Glori Bickers, MD  4:26 PM

## 2020-10-10 ENCOUNTER — Encounter (HOSPITAL_COMMUNITY)
Admission: EM | Disposition: A | Discharge status: Home Health | Payer: Self-pay | Source: Home / Self Care | Attending: Internal Medicine

## 2020-10-10 ENCOUNTER — Inpatient Hospital Stay (HOSPITAL_COMMUNITY): Payer: Medicare HMO

## 2020-10-10 ENCOUNTER — Encounter (HOSPITAL_COMMUNITY): Payer: Self-pay | Admitting: Internal Medicine

## 2020-10-10 ENCOUNTER — Inpatient Hospital Stay (HOSPITAL_COMMUNITY): Payer: Medicare HMO | Admitting: Certified Registered Nurse Anesthetist

## 2020-10-10 DIAGNOSIS — I4891 Unspecified atrial fibrillation: Secondary | ICD-10-CM

## 2020-10-10 DIAGNOSIS — I361 Nonrheumatic tricuspid (valve) insufficiency: Secondary | ICD-10-CM

## 2020-10-10 DIAGNOSIS — I5021 Acute systolic (congestive) heart failure: Secondary | ICD-10-CM | POA: Diagnosis not present

## 2020-10-10 DIAGNOSIS — R7989 Other specified abnormal findings of blood chemistry: Secondary | ICD-10-CM | POA: Diagnosis not present

## 2020-10-10 DIAGNOSIS — I340 Nonrheumatic mitral (valve) insufficiency: Secondary | ICD-10-CM | POA: Diagnosis not present

## 2020-10-10 DIAGNOSIS — I4892 Unspecified atrial flutter: Secondary | ICD-10-CM

## 2020-10-10 DIAGNOSIS — N179 Acute kidney failure, unspecified: Secondary | ICD-10-CM | POA: Diagnosis not present

## 2020-10-10 DIAGNOSIS — I484 Atypical atrial flutter: Secondary | ICD-10-CM

## 2020-10-10 HISTORY — PX: CARDIOVERSION: SHX1299

## 2020-10-10 HISTORY — PX: TEE WITHOUT CARDIOVERSION: SHX5443

## 2020-10-10 LAB — APTT
aPTT: 145 seconds — ABNORMAL HIGH (ref 24–36)
aPTT: 199 seconds (ref 24–36)
aPTT: 91 seconds — ABNORMAL HIGH (ref 24–36)

## 2020-10-10 LAB — BASIC METABOLIC PANEL
Anion gap: 11 (ref 5–15)
BUN: 45 mg/dL — ABNORMAL HIGH (ref 8–23)
CO2: 17 mmol/L — ABNORMAL LOW (ref 22–32)
Calcium: 7.8 mg/dL — ABNORMAL LOW (ref 8.9–10.3)
Chloride: 106 mmol/L (ref 98–111)
Creatinine, Ser: 2.44 mg/dL — ABNORMAL HIGH (ref 0.61–1.24)
GFR, Estimated: 27 mL/min — ABNORMAL LOW (ref >60)
Glucose, Bld: 228 mg/dL — ABNORMAL HIGH (ref 70–99)
Potassium: 3.6 mmol/L (ref 3.5–5.1)
Sodium: 134 mmol/L — ABNORMAL LOW (ref 135–145)

## 2020-10-10 LAB — CBC
HCT: 34.3 % — ABNORMAL LOW (ref 39.0–52.0)
Hemoglobin: 11.1 g/dL — ABNORMAL LOW (ref 13.0–17.0)
MCH: 28.1 pg (ref 26.0–34.0)
MCHC: 32.4 g/dL (ref 30.0–36.0)
MCV: 86.8 fL (ref 80.0–100.0)
Platelets: 127 10*3/uL — ABNORMAL LOW (ref 150–400)
RBC: 3.95 MIL/uL — ABNORMAL LOW (ref 4.22–5.81)
RDW: 18.8 % — ABNORMAL HIGH (ref 11.5–15.5)
WBC: 5.5 10*3/uL (ref 4.0–10.5)
nRBC: 0 % (ref 0.0–0.2)

## 2020-10-10 LAB — GLUCOSE, CAPILLARY
Glucose-Capillary: 126 mg/dL — ABNORMAL HIGH (ref 70–99)
Glucose-Capillary: 132 mg/dL — ABNORMAL HIGH (ref 70–99)
Glucose-Capillary: 142 mg/dL — ABNORMAL HIGH (ref 70–99)
Glucose-Capillary: 123 mg/dL — ABNORMAL HIGH (ref 70–99)
Glucose-Capillary: 154 mg/dL — ABNORMAL HIGH (ref 70–99)
Glucose-Capillary: 163 mg/dL — ABNORMAL HIGH (ref 70–99)

## 2020-10-10 LAB — COOXEMETRY PANEL
Carboxyhemoglobin: 0.8 % (ref 0.5–1.5)
Methemoglobin: 1 % (ref 0.0–1.5)
O2 Saturation: 61.4 %
Total hemoglobin: 10.9 g/dL — ABNORMAL LOW (ref 12.0–16.0)

## 2020-10-10 LAB — HEPARIN LEVEL (UNFRACTIONATED): Heparin Unfractionated: 0.66 IU/mL (ref 0.30–0.70)

## 2020-10-10 SURGERY — ECHOCARDIOGRAM, TRANSESOPHAGEAL
Anesthesia: General

## 2020-10-10 MED ORDER — METOLAZONE 2.5 MG PO TABS
2.5000 mg | ORAL_TABLET | Freq: Once | ORAL | Status: AC
  Administered 2020-10-10: 2.5 mg via ORAL
  Filled 2020-10-10: qty 1

## 2020-10-10 MED ORDER — DEXMEDETOMIDINE HCL IN NACL 400 MCG/100ML IV SOLN
INTRAVENOUS | Status: AC
  Filled 2020-10-10: qty 100

## 2020-10-10 MED ORDER — KETAMINE HCL 10 MG/ML IJ SOLN
INTRAMUSCULAR | Status: DC | PRN
  Administered 2020-10-10: 20 mg via INTRAVENOUS
  Administered 2020-10-10: 10 mg via INTRAVENOUS
  Administered 2020-10-10: 20 mg via INTRAVENOUS

## 2020-10-10 MED ORDER — KETAMINE HCL 50 MG/5ML IJ SOSY
PREFILLED_SYRINGE | INTRAMUSCULAR | Status: AC
  Filled 2020-10-10: qty 5

## 2020-10-10 MED ORDER — GLYCOPYRROLATE 0.2 MG/ML IJ SOLN
INTRAMUSCULAR | Status: DC | PRN
  Administered 2020-10-10 (×2): .1 mg via INTRAVENOUS

## 2020-10-10 MED ORDER — DEXMEDETOMIDINE HCL IN NACL 400 MCG/100ML IV SOLN
INTRAVENOUS | Status: DC | PRN
Start: 2020-10-10 — End: 2020-10-10
  Administered 2020-10-10: .7 ug/kg/h via INTRAVENOUS

## 2020-10-10 MED ORDER — LACTATED RINGERS IV SOLN: INTRAVENOUS | Status: DC | PRN

## 2020-10-10 MED ORDER — HEPARIN (PORCINE) 25000 UT/250ML-% IV SOLN
1400.0000 [IU]/h | INTRAVENOUS | Status: DC
  Administered 2020-10-10 – 2020-10-16 (×7): 1400 [IU]/h via INTRAVENOUS
  Filled 2020-10-10 (×8): qty 250

## 2020-10-10 MED ORDER — POTASSIUM CHLORIDE CRYS ER 20 MEQ PO TBCR
30.0000 meq | EXTENDED_RELEASE_TABLET | Freq: Two times a day (BID) | ORAL | Status: AC
  Administered 2020-10-10 (×2): 30 meq via ORAL
  Filled 2020-10-10 (×2): qty 1

## 2020-10-10 MED ORDER — AMIODARONE IV BOLUS ONLY 150 MG/100ML
150.0000 mg | Freq: Once | INTRAVENOUS | Status: AC
  Administered 2020-10-10: 150 mg via INTRAVENOUS
  Filled 2020-10-10: qty 100

## 2020-10-10 NOTE — Progress Notes (Signed)
   10/09/20 2031  Assess: MEWS Score  BP 118/80  O2 Device Room Air  Assess: MEWS Score  MEWS Temp 0  MEWS Systolic 0  MEWS Pulse 2  MEWS RR 1  MEWS LOC 0  MEWS Score 3  MEWS Score Color Yellow  Assess: if the MEWS score is Yellow or Red  Were vital signs taken at a resting state? Yes  Focused Assessment No change from prior assessment  Early Detection of Sepsis Score *See Row Information* Low  MEWS guidelines implemented *See Row Information* No, previously yellow, continue vital signs every 4 hours  Document  Patient Outcome Stabilized after interventions  Progress note created (see row info) Yes

## 2020-10-10 NOTE — Plan of Care (Signed)
  Problem: Coping: Goal: Level of anxiety will decrease Outcome: Completed/Met   Problem: Pain Managment: Goal: General experience of comfort will improve Outcome: Completed/Met

## 2020-10-10 NOTE — Progress Notes (Signed)
   10/09/20 2137  Assess: MEWS Score  BP 108/84  O2 Device Room Air  Assess: MEWS Score  MEWS Temp 0  MEWS Systolic 0  MEWS Pulse 2  MEWS RR 1  MEWS LOC 0  MEWS Score 3  MEWS Score Color Yellow  Assess: if the MEWS score is Yellow or Red  Were vital signs taken at a resting state? Yes  Focused Assessment No change from prior assessment  Early Detection of Sepsis Score *See Row Information* Low  MEWS guidelines implemented *See Row Information* No, previously yellow, continue vital signs every 4 hours  Document  Patient Outcome Stabilized after interventions  Progress note created (see row info) Yes

## 2020-10-10 NOTE — Progress Notes (Addendum)
Paged by nurse HR remains 140s, Afib. Got amio reload earlier today at 1730. Pt asymptomatic and BP stable. Also on milrinone. D/w Dr. Haroldine Laws who recommends another '150mg'$  IV amiodarone load. He will also be seeing patient this evening to guide next steps.

## 2020-10-10 NOTE — Progress Notes (Signed)
  Echocardiogram Echocardiogram Transesophageal has been performed.  Kyle Hicks 10/10/2020, 8:56 AM

## 2020-10-10 NOTE — Transfer of Care (Signed)
Immediate Anesthesia Transfer of Care Note  Patient: Kyle Hicks  Procedure(s) Performed: TRANSESOPHAGEAL ECHOCARDIOGRAM (TEE) (N/A ) CARDIOVERSION (N/A )  Patient Location: Endoscopy Unit  Anesthesia Type:MAC  Level of Consciousness: drowsy and patient cooperative  Airway & Oxygen Therapy: Patient Spontanous Breathing  Post-op Assessment: Report given to RN and Post -op Vital signs reviewed and stable  Post vital signs: Reviewed and stable  Last Vitals:  Vitals Value Taken Time  BP 85/53   Temp    Pulse 100   Resp 20   SpO2 98     Last Pain:  Vitals:   10/10/20 0732  TempSrc: Temporal  PainSc: 0-No pain         Complications: No complications documented.

## 2020-10-10 NOTE — TOC Progression Note (Signed)
Transition of Care (TOC) - Progression Note Heart Failure   Patient Details  Name: Kyle Hicks MRN: [id number] Date of Birth: 03/12/1941  Transition of Care [facility]) CM/SW [place], [place] Phone Number: 10/10/2020, 3:37 PM  Clinical Narrative:    CSW reached out to [facility] Ellard Artis) for help with DME orders for Mr. Eadie.    Durable Medical Equipment  (From admission, onward)         Start     Ordered   10/10/20 1454  For home use only DME Walker rolling  Once       Comments: Standard walker - no wheels  Question Answer Comment  Walker: With [place]   Patient needs a walker to treat with the following condition Decreased functional mobility and endurance      10/10/20 1453   10/10/20 1454  For home use only DME 3 n 1  Once        10/10/20 1453            Expected Discharge Plan: [place] Barriers to Discharge: Continued Medical Work up  Expected Discharge Plan and Services Expected Discharge Plan: [place] In-house Referral: Clinical Social Work Discharge Planning Services: CM Consult Post Acute Care Choice: [place] arrangements for the past 2 months: Single Family Home                 DME Arranged: N/A (Pt. reports none needed) DME Agency: NA       HH Arranged: PT,RN [place]: [facility] [employer] Date [place]: 10/10/20 Time [place]: 1150 Representative spoke with at [place]: [place]   Social Determinants of Health ([place]) Interventions    Readmission Risk Interventions No flowsheet data found.  Kelse Ploch, MSW, [place] Heart Failure Social Worker

## 2020-10-10 NOTE — Progress Notes (Signed)
PT Cancellation Note  Patient Details Name: KEVINMICHAEL DEBOISE MRN: [id number] DOB: Sep 23, 1940   Cancelled Treatment:    Reason Eval/Treat Not Completed: Patient at procedure or test/unavailable (TEE/cardioversion). Will follow-up for PT treatment as schedule permits.  Mabeline Caras, PT, DPT Acute Rehabilitation Services  Pager [phone] Office [place] 10/10/2020, 7:26 AM

## 2020-10-10 NOTE — Progress Notes (Signed)
   10/09/20 1936  Assess: MEWS Score  ECG Heart Rate (!) 128  Resp (!) 22  Assess: MEWS Score  MEWS Temp 0  MEWS Systolic 0  MEWS Pulse 2  MEWS RR 1  MEWS LOC 0  MEWS Score 3  MEWS Score Color Yellow  Assess: if the MEWS score is Yellow or Red  Were vital signs taken at a resting state? Yes  Focused Assessment No change from prior assessment  Early Detection of Sepsis Score *See Row Information* Low  MEWS guidelines implemented *See Row Information* No, previously yellow, continue vital signs every 4 hours  Document  Patient Outcome Stabilized after interventions  Progress note created (see row info) Yes

## 2020-10-10 NOTE — TOC Progression Note (Signed)
Transition of Care (TOC) - Progression Note Heart Failure   Patient Details  Name: Kyle Hicks MRN: [id number] Date of Birth: May 04, 1941  Transition of Care [facility]) CM/SW [place], [place] Phone Number: 10/10/2020, 12:39 PM  Clinical Narrative:    CSW received a call back from [place] with [facility] [phone]). She reported that [facility] is able to accept Mr. Bolivar for nursing and physical therapy. Mr. Mcgarrity will need a Face to Face HHPT/RN order in when he is ready to discharge.         Expected Discharge Plan and Services: [facility] HH[facility]                                                 Social Determinants of Health (SDOH) Interventions    Readmission Risk Interventions No flowsheet data found.   Lakeena Downie, MSW, [place] Heart Failure Social Worker

## 2020-10-10 NOTE — Anesthesia Preprocedure Evaluation (Addendum)
Anesthesia Evaluation  Patient identified by MRN, date of birth, ID band Patient awake    Reviewed: Allergy & Precautions, NPO status , Patient's Chart, lab work & pertinent test results  History of Anesthesia Complications Negative for: history of anesthetic complications  Airway Mallampati: II  TM Distance: >3 FB Neck ROM: Full    Dental  (+) Edentulous Upper,    Pulmonary neg shortness of breath, neg sleep apnea, neg COPD, neg recent URI, former smoker,  Covid-19 Nucleic Acid Test Results Lab Results      Component                Value               Date                      SARSCOV2NAA              NEGATIVE            10/07/2020              breath sounds clear to auscultation       Cardiovascular hypertension, Pt. on medications +CHF  + dysrhythmias Atrial Fibrillation  Rhythm:Irregular  1. Left ventricular ejection fraction, by estimation, is 10-15%. The left  ventricle has severely decreased function. The left ventricle demonstrates  global hypokinesis. There is mild concentric left ventricular hypertrophy.  Left ventricular diastolic  function could not be evaluated.  2. Right ventricular systolic function is severely reduced. The right  ventricular size is mildly enlarged. There is moderately elevated  pulmonary artery systolic pressure. The estimated right ventricular  systolic pressure is 123456 mmHg.  3. Left atrial size was moderately dilated.  4. The mitral valve is grossly normal. Mild mitral valve regurgitation.  No evidence of mitral stenosis.  5. The aortic valve is tricuspid. Aortic valve regurgitation is mild. No  aortic stenosis is present.  6. 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  7. The inferior vena cava is dilated in size with <50% respiratory  variability, suggesting right atrial pressure of 15 mmHg.    Neuro/Psych negative neurological ROS  negative psych ROS    GI/Hepatic Neg liver ROS, PUD,   Endo/Other  diabetesMorbid obesityLab Results      Component                Value               Date                      HGBA1C                   5.9 (H)             10/07/2020             Renal/GU CRFRenal diseaseLab Results      Component                Value               Date                      CREATININE               2.44 (H)            10/10/2020           Lab  Results      Component                Value               Date                      K                        3.6                 10/10/2020                Musculoskeletal negative musculoskeletal ROS (+)   Abdominal   Peds  Hematology  (+) Blood dyscrasia, anemia , Lab Results      Component                Value               Date                      WBC                      5.5                 10/10/2020                HGB                      11.1 (L)            10/10/2020                HCT                      34.3 (L)            10/10/2020                MCV                      86.8                10/10/2020                PLT                      127 (L)             10/10/2020               Anesthesia Other Findings   Reproductive/Obstetrics                             Anesthesia Physical Anesthesia Plan  ASA: IV  Anesthesia Plan: General   Post-op Pain Management:    Induction: Intravenous  PONV Risk Score and Plan: 1 and Treatment may vary due to age or medical condition  Airway Management Planned: Nasal Cannula  Additional Equipment: None  Intra-op Plan:   Post-operative Plan:   Informed Consent: I have reviewed the patients History and Physical, chart, labs and discussed the procedure including the risks, benefits and alternatives for the proposed anesthesia with the patient or authorized representative who has indicated his/her understanding and acceptance.     Dental advisory given  Plan Discussed with: CRNA and  Surgeon  Anesthesia Plan Comments:        Anesthesia Quick Evaluation

## 2020-10-10 NOTE — Progress Notes (Signed)
   10/10/20 1900  Assess: MEWS Score  Temp 98 F (36.7 C)  BP 111/73  Pulse Rate 72  ECG Heart Rate (!) 119  Resp (!) 23  SpO2 98 %  Assess: MEWS Score  MEWS Temp 0  MEWS Systolic 0  MEWS Pulse 2  MEWS RR 1  MEWS LOC 0  MEWS Score 3  MEWS Score Color Yellow  Assess: if the MEWS score is Yellow or Red  Were vital signs taken at a resting state? Yes  Focused Assessment No change from prior assessment  Early Detection of Sepsis Score *See Row Information* Low  MEWS guidelines implemented *See Row Information* No, previously yellow, continue vital signs every 4 hours  Treat  MEWS Interventions Escalated (See documentation below)  Pain Scale 0-10  Pain Score 0  Escalate  MEWS: Escalate Yellow: discuss with charge nurse/RN and consider discussing with provider and RRT  Notify: Charge Nurse/RN  Name of Charge Nurse/RN Notified Santiago Glad RN  Date Charge Nurse/RN Notified 10/10/20  Time Charge Nurse/RN Notified 1900  Notify: Provider  Provider Name/Title Dunn PA  Date Provider Notified 10/10/20  Time Provider Notified 1900 (notified by AM RN)  Notification Type Page  Notification Reason Change in status  Provider response See new orders;At bedside  Date of Provider Response 10/10/20  Time of Provider Response 2001  Document  Patient Outcome Not stable and remains on department  Progress note created (see row info) Yes

## 2020-10-10 NOTE — Plan of Care (Signed)
  Problem: Cardiac: Goal: Ability to achieve and maintain adequate cardiopulmonary perfusion will improve Outcome: Progressing   Problem: Education: Goal: Knowledge of General Education information will improve Description: Including pain rating scale, medication(s)/side effects and non-pharmacologic comfort measures Outcome: Progressing   

## 2020-10-10 NOTE — Progress Notes (Signed)
[place] for Xarelto>Heparin Indication: atrial fibrillation  No Known Allergies  Patient Measurements: Height: '5\' 6"'$  (167.6 cm) Weight: 116.6 kg (257 lb 0.9 oz) IBW/kg (Calculated) : 63.8 Heparin Dosing Weight: 89.3 kg  Vital Signs: Temp: 98.2 F (36.8 C) (03/18 0021) Temp Source: Oral (03/18 0021) BP: 112/77 (03/18 0021) Pulse Rate: 125 (03/18 0021)  Labs: Recent Labs    10/07/20 0229 10/07/20 0429 10/07/20 1520 10/07/20 2011 10/08/20 0119 10/08/20 1124 10/09/20 0404 10/09/20 1220 10/09/20 2300  HGB 13.5  --   --   --  12.2*  --  12.3*  --   --   HCT 43.9  --   --   --  36.4*  --  37.4*  --   --   PLT 165  --   --   --  151  --  105*  --   --   APTT  --   --   --   --   --   --   --  35 145*  HEPARINUNFRC  --   --    < >  --  0.13* 0.60  --  1.58* 1.08*  CREATININE 2.78*  --   --  2.63* 2.69*  --  2.51*  --   --   TROPONINIHS 75* 81*  --   --   --   --   --   --   --    < > = values in this interval not displayed.    Estimated Creatinine Clearance: 29.6 mL/min (A) (by C-G formula based on SCr of 2.51 mg/dL (H)).   Assessment: 80 yo M with new Afib with RVR. No AC noted PTA. Was started on heparin then switched to Xarelto 3/16 and received 1 dose. Pharmacy asked to switch back to IV heparin. Planning for DCCV 3/18. PICC line placed today. CBC stable with Hgb 12.3, pltc down 151>105. No overt bleeding noted.  Heparin level 1.08, PTT 145 sec (both supratherapeutic) - may be correlating but difficult to tell at this point.   Redrew PTT as unsure if it was accurate, PTT came back 199 (supratherapeutic). No bleeding noted. RN confirmed that he drew the labs from PICC line and heparin is running peripherally.  Goal of Therapy:  Heparin level 0.3-0.7 units/ml aPTT 66-102 seconds Monitor platelets by anticoagulation protocol: Yes   Plan:  Hold heparin x 1 hour Restart heparin at 1400 units/hr Will f/u 8 hr PTT and heparin  level  Sherlon Handing, PharmD, BCPS Please see amion for complete clinical pharmacist phone list 10/10/2020  1:52 AM

## 2020-10-10 NOTE — Progress Notes (Signed)
   10/10/20 0021  Assess: MEWS Score  Temp 98.2 F (36.8 C)  BP 112/77  Pulse Rate (!) 125  ECG Heart Rate (!) 127  Resp (!) 24  SpO2 99 %  O2 Device Room Air  Assess: MEWS Score  MEWS Temp 0  MEWS Systolic 0  MEWS Pulse 2  MEWS RR 1  MEWS LOC 0  MEWS Score 3  MEWS Score Color Yellow  Assess: if the MEWS score is Yellow or Red  Were vital signs taken at a resting state? Yes  Focused Assessment No change from prior assessment  Early Detection of Sepsis Score *See Row Information* Low  MEWS guidelines implemented *See Row Information* No, previously yellow, continue vital signs every 4 hours  Document  Patient Outcome Stabilized after interventions  Progress note created (see row info) Yes

## 2020-10-10 NOTE — Progress Notes (Addendum)
Physical Therapy Treatment Patient Details Name: Kyle Hicks MRN: [id number] DOB: Oct 08, 1940 Today's Date: 10/10/2020    History of Present Illness Pt is a 80 y.o. male admitted 10/07/20 with c/o diarrhea, weakness and fall at home. CXR with enlarged cardiac sillhouette, small R-side pleural effusion. Pt also with tachycardia, HF, AKI. Pt with aflutter RVR 3/17. 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 PMH includes HTN, CHF, DM2, CKD IIIa, PUD, GIB.   PT Comments    Session limited by pt's tachycardia at rest and with activity; resting HR 106-130s, up to 162 with brief standing for pericare/linen change due to incontinence (RN aware). Pt requiring up to [place] for mobility this session; limited by cardio status as well as fatigue, decreased activity tolerance, generalized weakness. Pending mobility progression, pt may require short-term SNF-level therapies to maximize functional mobility and independence prior to return home; pt planning for home with HHPT services. Will continue to follow acutely.   Follow Up Recommendations  Home health PT;Supervision for mobility/OOB (pending progress)     Equipment Recommendations  3in1 (PT) (already delivered)    Recommendations for Other Services       Precautions / Restrictions Precautions Precautions: Fall;Other (comment) Precaution Comments: Watch HR - tachycardia Restrictions Weight Bearing Restrictions: No    Mobility  Bed Mobility Overal bed mobility: Needs Assistance Bed Mobility: Supine to Sit;Sit to Supine     Supine to sit: Mod assist;HOB elevated Sit to supine: Mod assist   General bed mobility comments: Increased time and effort with use of bed rails, modA to assist trunk elevation and scoot hips to EOB; modA for BLE management with return to supine    Transfers Overall transfer level: Needs assistance Equipment used: Rolling walker (2  wheeled) Transfers: Sit to/from Stand Sit to Stand: Min assist;From elevated surface         General transfer comment: Reliant on momentum and minA to power into standing from EOB to RW  Ambulation/Gait Ambulation/Gait assistance: Min guard Gait Distance (Feet): 2 Feet Assistive device: Rolling walker (2 wheeled) Gait Pattern/deviations: Step-to pattern;Trunk flexed     General Gait Details: Took side steps towards HOB with RW and min guard for balance, DOE 3/4; deferred further mobility due to tachycardia   Stairs             Wheelchair Mobility    Modified Rankin (Stroke Patients Only)       Balance Overall balance assessment: Needs assistance Sitting-balance support: No upper extremity supported;Feet supported Sitting balance-[name] Scale: Fair       Standing balance-[name] Scale: Poor Standing balance comment: reliant on BUE support of RW                            Cognition Arousal/Alertness: Awake/alert Behavior During Therapy: WFL for tasks assessed/performed;Flat affect Overall Cognitive Status: No family/caregiver present to determine baseline cognitive functioning Area of Impairment: Following commands;Problem solving;Awareness                       Following Commands: Follows one step commands with increased time   Awareness: Emergent Problem Solving: Slow processing;Requires verbal cues        Exercises      General Comments General comments (skin integrity, edema, etc.): Patient with tachycardia at rest and throughout session; HR 106-162. Planned to limit session to seated EOB activity secondary to this; noted pt's linens soiled,  therefore stood briefly to address this with side steps so pt scooted up higher in bed. RN present and aware. SpO2 96% on RA. Notified RN of pt's RUE port dressing with blood underneath (appears old/dried)      Pertinent Vitals/Pain Pain Assessment: No/denies pain    Home Living                       Prior Function            PT Goals (current goals can now be found in the care plan section) Progress towards PT goals: Progressing toward goals (slowly; mobility limited by tachycardia)    Frequency    Min 3X/week      PT Plan Current plan remains appropriate    Co-evaluation              AM-PAC PT "6 Clicks" Mobility   Outcome Measure  Help needed turning from your back to your side while in a flat bed without using bedrails?: A Lot Help needed moving from lying on your back to sitting on the side of a flat bed without using bedrails?: A Lot Help needed moving to and from a bed to a chair (including a wheelchair)?: A Little Help needed standing up from a chair using your arms (e.g., wheelchair or bedside chair)?: A Little Help needed to walk in hospital room?: A Little Help needed climbing 3-5 steps with a railing? : A Lot 6 Click Score: 15    End of Session   Activity Tolerance: Treatment limited secondary to medical complications (Comment) (tachycardia) Patient left: in bed;with call bell/phone within reach;with bed alarm set;with nursing/sitter in room Nurse Communication: Mobility status PT Visit Diagnosis: Muscle weakness (generalized) (M62.81);Unsteadiness on feet (R26.81);Other abnormalities of gait and mobility (R26.89);History of falling (Z91.81)     Time: [id number] PT Time Calculation (min) (ACUTE ONLY): 21 min  Charges:  $Therapeutic Activity: 8-22 mins                    Mabeline Caras, PT, DPT Acute Rehabilitation Services  Pager [phone] Office [place] 10/10/2020, 5:07 PM

## 2020-10-10 NOTE — Progress Notes (Signed)
PROGRESS NOTE    Kyle Hicks  [id number] DOB: [birth year] DOA: 10/07/2020 PCP: Sharilyn Sites, MD    Brief Narrative:  Kyle Hicks is a 80 y.o. male with medical history significant of hypertension, diastolic CHF last EF [id number] with grade 1 diastolic dysfunction, diabetes mellitus type 2, and PUD with history of GI bleed presents after having a fall at home.  He reports that he had not been feeling well for the last 2 days.  This morning when he got up to use the restroom he reports that his left leg got weak and he fell to his side off the couch.  He did not hit his head or lose consciousness.  Denies any change in speech, vision, sensation.  He feels globally weak.  Noted associated symptoms of diarrhea, possibly some weight loss, leg swelling, shortness of breath, intermittent wheezing, and palpitations.  Patient has not noticed any blood or dark stools, but he had been getting up to have a stool every 25-30 minutes recently.  Denies having any significant cough, calf/leg pain, or orthopnea symptoms.  Wife notes that he had been very weak yesterday and stayed in the bathroom all day.  He had not been eating well and had been drinking Ensure recently.  3/17-monitor with a flutter RVR in the 130s 3/18-TEE cardioversion today was unsuccessful   Consultants:   cardiology  Procedures:   Antimicrobials:       Subjective: Patient was post TEE.  Sleeping does not wake up on my exam.  Snoring.  Objective: Vitals:   10/10/20 0909 10/10/20 0932 10/10/20 1000 10/10/20 1100  BP: (!) 95/54 (!) 94/56 104/86   Pulse: 65 61 98   Resp: '19 20 18 18  '$ Temp:  97.6 F (36.4 C) 97.8 F (36.6 C)   TempSrc:  Oral Axillary   SpO2: 98% 98% 98%   Weight:      Height:        Intake/Output Summary (Last 24 hours) at 10/10/2020 1233 Last data filed at 10/10/2020 1000 Gross per 24 hour  Intake 2250.76 ml  Output 1627 ml  Net 623.76 ml   Filed Weights   10/09/20 0005 10/09/20 0500 10/10/20  0115  Weight: 117.6 kg 117.6 kg 116.6 kg    Examination: Lying in bed sleeping Anteriorly clear no wheezing Regular/irregular S1-S2 no gallops Abdomen soft nontender positive bowel sounds Edema bilaterally Unable to do neuro exam since patient states preprocedure    Data Reviewed: I have personally reviewed following labs and imaging studies  CBC: Recent Labs  Lab 10/07/20 0229 10/08/20 0119 10/09/20 0404 10/10/20 0707  WBC 7.5 6.2 6.1 5.5  NEUTROABS 5.9  --   --   --   HGB 13.5 12.2* 12.3* 11.1*  HCT 43.9 36.4* 37.4* 34.3*  MCV 91.1 87.9 86.0 86.8  PLT 165 151 105* [id number]*   Basic Metabolic Panel: Recent Labs  Lab 10/07/20 0229 10/07/20 2011 10/08/20 0119 10/09/20 0404 10/10/20 0707  NA 139 135 137 137 134*  K 5.1 5.1 5.1 4.5 3.6  CL 113* 112* 116* 113* 106  CO2 13* 12* 12* 13* 17*  GLUCOSE 73 140* 117* 105* 228*  BUN 47* 49* 51* 48* 45*  CREATININE 2.78* 2.63* 2.69* 2.51* 2.44*  CALCIUM 8.7* 8.0* 8.2* 8.1* 7.8*  MG 1.9 1.7  --   --   --    GFR: Estimated Creatinine Clearance: 30.4 mL/min (A) (by C-G formula based on SCr of 2.44 mg/dL (H)). Liver Function Tests: Recent  Labs  Lab 10/07/20 0229  AST 29  ALT 26  ALKPHOS 49  BILITOT 1.4*  PROT 7.0  ALBUMIN 3.4*   No results for input(s): LIPASE, AMYLASE in the last 168 hours. No results for input(s): AMMONIA in the last 168 hours. Coagulation Profile: No results for input(s): INR, PROTIME in the last 168 hours. Cardiac Enzymes: No results for input(s): CKTOTAL, CKMB, CKMBINDEX, TROPONINI in the last 168 hours. BNP (last 3 results) No results for input(s): PROBNP in the last 8760 hours. HbA1C: Recent Labs    10/07/20 1300  HGBA1C 5.9*   CBG: Recent Labs  Lab 10/09/20 2328 10/10/20 0344 10/10/20 0738 10/10/20 0932 10/10/20 1138  GLUCAP 158* 126* 132* 142* 123*   Lipid Profile: No results for input(s): CHOL, HDL, LDLCALC, TRIG, CHOLHDL, LDLDIRECT in the last 72 hours. Thyroid Function  Tests: Recent Labs    10/07/20 1300  TSH 0.720   Anemia Panel: No results for input(s): VITAMINB12, FOLATE, FERRITIN, TIBC, IRON, RETICCTPCT in the last 72 hours. Sepsis Labs: Recent Labs  Lab 10/07/20 0229 10/07/20 0429  LATICACIDVEN 2.0* 1.8    Recent Results (from the past 240 hour(s))  Resp Panel by RT-PCR (Flu A&B, Covid) Nasopharyngeal Swab     Status: None   Collection Time: 10/07/20  3:07 AM   Specimen: Nasopharyngeal Swab; Nasopharyngeal(NP) swabs in vial transport medium  Result Value Ref Range Status   SARS Coronavirus 2 by RT PCR NEGATIVE NEGATIVE Final    Comment: (NOTE) SARS-CoV-2 target nucleic acids are NOT DETECTED.  The SARS-CoV-2 RNA is generally detectable in upper respiratory specimens during the acute phase of infection. The lowest concentration of SARS-CoV-2 viral copies this assay can detect is 138 copies/mL. A negative result does not preclude SARS-Cov-2 infection and should not be used as the sole basis for treatment or other patient management decisions. A negative result may occur with  improper specimen collection/handling, submission of specimen other than nasopharyngeal swab, presence of viral mutation(s) within the areas targeted by this assay, and inadequate number of viral copies(<138 copies/mL). A negative result must be combined with clinical observations, patient history, and epidemiological information. The expected result is Negative.  Fact Sheet for Patients:  EntrepreneurPulse.com.au  Fact Sheet for Healthcare Providers:  IncredibleEmployment.be  This test is no t yet approved or cleared by the [place] FDA and  has been authorized for detection and/or diagnosis of SARS-CoV-2 by FDA under an Emergency Use Authorization (EUA). This EUA will remain  in effect (meaning this test can be used) for the duration of the COVID-19 declaration under Section 564(b)(1) of the Act, 21 U.S.C.section  360bbb-3(b)(1), unless the authorization is terminated  or revoked sooner.       Influenza A by PCR NEGATIVE NEGATIVE Final   Influenza B by PCR NEGATIVE NEGATIVE Final    Comment: (NOTE) The Xpert Xpress SARS-CoV-2/FLU/RSV plus assay is intended as an aid in the diagnosis of influenza from Nasopharyngeal swab specimens and should not be used as a sole basis for treatment. Nasal washings and aspirates are unacceptable for Xpert Xpress SARS-CoV-2/FLU/RSV testing.  Fact Sheet for Patients: EntrepreneurPulse.com.au  Fact Sheet for Healthcare Providers: IncredibleEmployment.be  This test is not yet approved or cleared by the [place] FDA and has been authorized for detection and/or diagnosis of SARS-CoV-2 by FDA under an Emergency Use Authorization (EUA). This EUA will remain in effect (meaning this test can be used) for the duration of the COVID-19 declaration under Section 564(b)(1) of the Act, 21  U.S.C. section 360bbb-3(b)(1), unless the authorization is terminated or revoked.  Performed at [facility], [place] [address] [place], [place] [address]          Radiology Studies: DG CHEST PORT 1 VIEW  Result Date: 10/09/2020 CLINICAL DATA:  PICC line placement. EXAM: PORTABLE CHEST 1 VIEW COMPARISON:  02/23/2013 FINDINGS: Right arm PICC line tip is in the projection of the distal SVC. Stable mild cardiac enlargement. Small right pleural effusion unchanged from previous exam. No airspace opacities or interstitial edema. IMPRESSION: 1. Tip of right arm PICC line is in the projection of the distal SVC. 2. Persistent small right pleural effusion. Electronically Signed   By: Kerby Moors M.D.   On: 10/09/2020 11:07   ECHOCARDIOGRAM COMPLETE  Result Date: 10/08/2020    ECHOCARDIOGRAM REPORT   Patient Name:   Kyle Hicks Date of Exam: 10/08/2020 Medical Rec #:  [id number]      Height:       66.0 in Accession #:    [id number]     Weight:        260.4 lb Date of Birth:  [birth year]      BSA:          2.237 m Patient Age:    87 years       BP:           117/91 mmHg Patient Gender: M              HR:           131 bpm. Exam Location:  Inpatient Procedure: 2D Echo, Cardiac Doppler, Color Doppler and Intracardiac            Opacification Agent Indications:    I50.40* Unspecified combined systolic (congestive) and diastolic                 (congestive) heart failure  History:        Patient has prior history of Echocardiogram examinations, most                 recent 04/25/2017. Abnormal ECG, Arrythmias:Atrial Fibrillation,                 Signs/Symptoms:Chest Pain; Risk Factors:Dyslipidemia, Diabetes                 and Hypertension. Elevated troponin.  Sonographer:    Roseanna Rainbow RDCS Referring Phys: [id number] RONDELL A SMITH  Sonographer Comments: Technically difficult study due to poor echo windows and patient is morbidly obese. Image acquisition challenging due to patient body habitus. IMPRESSIONS  1. Left ventricular ejection fraction, by estimation, is 10-15%. The left ventricle has severely decreased function. The left ventricle demonstrates global hypokinesis. There is mild concentric left ventricular hypertrophy. Left ventricular diastolic function could not be evaluated.  2. Right ventricular systolic function is severely reduced. The right ventricular size is mildly enlarged. There is moderately elevated pulmonary artery systolic pressure. The estimated right ventricular systolic pressure is 123456 mmHg.  3. Left atrial size was moderately dilated.  4. The mitral valve is grossly normal. Mild mitral valve regurgitation. No evidence of mitral stenosis.  5. The aortic valve is tricuspid. Aortic valve regurgitation is mild. No aortic stenosis is present.  6. 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  7. The inferior vena cava is dilated in size with <50% respiratory variability, suggesting right atrial pressure of 15 mmHg.  Comparison(s): Changes from prior study are noted. EF now  severely reduced. Afib with RVR now present. FINDINGS  Left Ventricle: Left ventricular ejection fraction, by estimation, is 10-15%. The left ventricle has severely decreased function. The left ventricle demonstrates global hypokinesis. Definity contrast agent was given IV to delineate the left ventricular endocardial borders. The left ventricular internal cavity size was normal in size. There is mild concentric left ventricular hypertrophy. Left ventricular diastolic function could not be evaluated due to atrial fibrillation. Left ventricular diastolic function could not be evaluated. Right Ventricle: The right ventricular size is mildly enlarged. No increase in right ventricular wall thickness. Right ventricular systolic function is severely reduced. There is moderately elevated pulmonary artery systolic pressure. The tricuspid regurgitant velocity is 2.92 m/s, and with an assumed right atrial pressure of 15 mmHg, the estimated right ventricular systolic pressure is 123456 mmHg. Left Atrium: Left atrial size was moderately dilated. Right Atrium: Right atrial size was normal in size. Pericardium: Trivial pericardial effusion is present. Mitral Valve: The mitral valve is grossly normal. Mild mitral valve regurgitation. No evidence of mitral valve stenosis. Tricuspid Valve: The tricuspid valve is grossly normal. Tricuspid valve regurgitation is mild . No evidence of tricuspid stenosis. Aortic Valve: The aortic valve is tricuspid. Aortic valve regurgitation is mild. No aortic stenosis is present. Pulmonic Valve: The pulmonic valve was grossly normal. Pulmonic valve regurgitation is trivial. No evidence of pulmonic stenosis. Aorta: The aortic root is normal in size and structure. 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 Venous: The inferior vena cava is dilated in size with less than 50% respiratory variability, suggesting right atrial  pressure of 15 mmHg. IAS/Shunts: The atrial septum is grossly normal. EKG: Rhythm strip during this exam demostrated atrial fibrillation.  LEFT VENTRICLE PLAX 2D LVIDd:         5.40 cm LVIDs:         5.10 cm LV PW:         1.60 cm LV IVS:        1.20 cm LVOT diam:     2.10 cm LV SV:         41 LV SV Index:   18 LVOT Area:     3.46 cm  RIGHT VENTRICLE            IVC RV S prime:     9.67 cm/s  IVC diam: 3.10 cm TAPSE (M-mode): 0.7 cm LEFT ATRIUM              Index       RIGHT ATRIUM           Index LA diam:        3.90 cm  1.74 cm/m  RA Area:     22.40 cm LA Vol (A2C):   129.0 ml 57.67 ml/m RA Volume:   66.40 ml  29.68 ml/m LA Vol (A4C):   86.8 ml  38.80 ml/m LA Biplane Vol: 109.0 ml 48.73 ml/m  AORTIC VALVE LVOT Vmax:   80.90 cm/s LVOT Vmean:  55.100 cm/s LVOT VTI:    0.117 m  AORTA Ao Root diam: 3.60 cm Ao Asc diam:  4.30 cm MITRAL VALVE                 TRICUSPID VALVE MV Area (PHT): 6.52 cm      TR Peak grad:   34.1 mmHg MV Decel Time: 116 msec      TR Vmax:        292.00 cm/s MR Peak grad:  71.6 mmHg MR Mean grad:    44.0 mmHg   SHUNTS MR Vmax:         423.00 cm/s Systemic VTI:  0.12 m MR Vmean:        316.0 cm/s  Systemic Diam: 2.10 cm MR PISA:         0.25 cm MR PISA Eff ROA: 2 mm MR PISA Radius:  0.20 cm MV E velocity: 119.33 cm/s Eleonore Chiquito MD Electronically signed by Eleonore Chiquito MD Signature Date/Time: 10/08/2020/5:30:08 PM    Final    ECHO TEE  Result Date: 10/10/2020    TRANSESOPHOGEAL ECHO REPORT   Patient Name:   Kyle Hicks [place] Date of Exam: 10/10/2020 Medical Rec #:  [id number]      Height:       66.0 in Accession #:    [id number]     Weight:       257.1 lb Date of Birth:  [birth year]      BSA:          2.225 m Patient Age:    65 years       BP:           126/75 mmHg Patient Gender: M              HR:           127 bpm. Exam Location:  Inpatient Procedure: 2D Echo, Cardiac Doppler and Color Doppler Indications:     R94.31 Abnormal EKG; I48.91* Unspeicified atrial fibrillation  History:          Patient has prior history of Echocardiogram examinations, most                  recent 10/08/2020. CHF; Risk Factors:Hypertension, Diabetes and                  Dyslipidemia.  Sonographer:     Roseanna Rainbow RDCS Referring Phys:  [place] Diagnosing Phys: Kirk Ruths MD PROCEDURE: After discussion of the risks and benefits of a TEE, an informed consent was obtained from the patient. The transesophogeal probe was passed without difficulty through the esophogus of the patient. Imaged were obtained with the patient in a left lateral decubitus position. Sedation performed by different physician. The patient was monitored while under deep sedation. The patient developed no complications during the procedure. An unsuccessful direct current cardioversion was performed at 200 joules with 3 attempts. Ketamine '50mg'$ . IMPRESSIONS  1. Left ventricular ejection fraction, by estimation, is <20%. The left ventricle has severely decreased function. The left ventricle demonstrates global hypokinesis. The left ventricular internal cavity size was mildly dilated.  2. Right ventricular systolic function is severely reduced. The right ventricular size is severely enlarged.  3. Left atrial size was moderately dilated. No left atrial/left atrial appendage thrombus was detected.  4. Right atrial size was moderately dilated.  5. The mitral valve is abnormal. 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  6. Tricuspid valve regurgitation is severe.  7. The aortic valve is tricuspid. Aortic valve regurgitation is mild. Mild aortic valve sclerosis is present, with no evidence of aortic valve stenosis.  8. Aortic dilatation noted. There is mild dilatation of the aortic root, measuring 40 mm. FINDINGS  Left Ventricle: Left ventricular ejection fraction, by estimation, is <20%. The left ventricle has severely decreased function. The left ventricle demonstrates global hypokinesis. The left ventricular internal cavity size was  mildly dilated. Right Ventricle: The right ventricular size is severely enlarged. Right  vetricular wall thickness was not assessed. Right ventricular systolic function is severely reduced. Left Atrium: Left atrial size was moderately dilated. Spontaneous echo contrast was present in the left atrium and left atrial appendage. No left atrial/left atrial appendage thrombus was detected. Right Atrium: Right atrial size was moderately dilated. Pericardium: There is no evidence of pericardial effusion. Mitral Valve: The mitral valve is abnormal. 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 Tricuspid Valve: The tricuspid valve is normal in structure. Tricuspid valve regurgitation is severe. Aortic Valve: The aortic valve is tricuspid. Aortic valve regurgitation is mild. Mild aortic valve sclerosis is present, with no evidence of aortic valve stenosis. Pulmonic Valve: The pulmonic valve was grossly normal. Pulmonic valve regurgitation is trivial. Aorta: Aortic dilatation noted. There is mild dilatation of the aortic root, measuring 40 mm. IAS/Shunts: No atrial level shunt detected by color flow Doppler. Kirk Ruths MD Electronically signed by Kirk Ruths MD Signature Date/Time: 10/10/2020/10:31:47 AM    Final    VAS [place] LOWER EXTREMITY VENOUS (DVT)  Result Date: 10/09/2020  Lower Venous DVT Study Indications: Edema, and elevated D-dimer.  Risk Factors: CHF, CKD3. Limitations: Body habitus, poor ultrasound/tissue interface and Difficulty getting color fill likely due to habitus. Comparison Study: No previous exams Performing Technologist: Rogelia Rohrer  Examination Guidelines: A complete evaluation includes B-mode imaging, spectral Doppler, color Doppler, and power Doppler as needed of all accessible portions of each vessel. Bilateral testing is considered an integral part of a complete examination. Limited examinations for reoccurring indications may be performed as noted. The reflux portion of the exam is performed  with the patient in reverse Trendelenburg.  +---------+---------------+---------+-----------+----------+--------------+ RIGHT    CompressibilityPhasicitySpontaneityPropertiesThrombus Aging +---------+---------------+---------+-----------+----------+--------------+ CFV      Full           Yes      Yes                                 +---------+---------------+---------+-----------+----------+--------------+ SFJ      Full                                                        +---------+---------------+---------+-----------+----------+--------------+ FV Prox  Full           Yes      Yes                                 +---------+---------------+---------+-----------+----------+--------------+ FV Mid   Full           Yes      Yes                                 +---------+---------------+---------+-----------+----------+--------------+ FV DistalFull           Yes      Yes                                 +---------+---------------+---------+-----------+----------+--------------+ PFV      Full                                                        +---------+---------------+---------+-----------+----------+--------------+  POP      Full           Yes      Yes                                 +---------+---------------+---------+-----------+----------+--------------+ PTV      Full                                                        +---------+---------------+---------+-----------+----------+--------------+ PERO     Full                                                        +---------+---------------+---------+-----------+----------+--------------+   +---------+---------------+---------+-----------+----------+--------------+ LEFT     CompressibilityPhasicitySpontaneityPropertiesThrombus Aging +---------+---------------+---------+-----------+----------+--------------+ CFV      Full           Yes      Yes                                  +---------+---------------+---------+-----------+----------+--------------+ SFJ      Full                                                        +---------+---------------+---------+-----------+----------+--------------+ FV Prox  Full           Yes      Yes                                 +---------+---------------+---------+-----------+----------+--------------+ FV Mid   Full           Yes      Yes                                 +---------+---------------+---------+-----------+----------+--------------+ FV DistalFull           Yes      Yes                                 +---------+---------------+---------+-----------+----------+--------------+ PFV      Full                                                        +---------+---------------+---------+-----------+----------+--------------+ POP      Full           Yes      Yes                                 +---------+---------------+---------+-----------+----------+--------------+ PTV  Full                                                        +---------+---------------+---------+-----------+----------+--------------+ PERO     Full                                                        +---------+---------------+---------+-----------+----------+--------------+     Summary: BILATERAL: - No evidence of deep vein thrombosis seen in the lower extremities, bilaterally. - No evidence of superficial venous thrombosis in the lower extremities, bilaterally. -No evidence of popliteal cyst, bilaterally.   *See table(s) above for measurements and observations. Electronically signed by Servando Snare MD on 10/09/2020 at 6:01:59 PM.    Final    [place] EKG SITE RITE  Result Date: 10/09/2020 If Site Rite image not attached, placement could not be confirmed due to current cardiac rhythm.       Scheduled Meds: . atorvastatin  20 mg Oral Daily  . Chlorhexidine Gluconate Cloth  6 each Topical Daily  . furosemide  80  mg Intravenous BID  . insulin aspart  0-6 Units Subcutaneous Q4H  . metolazone  2.5 mg Oral Once  . pantoprazole  40 mg Oral Daily  . potassium chloride  30 mEq Oral BID  . sodium chloride flush  3 mL Intravenous Q12H   Continuous Infusions: . sodium chloride    . amiodarone 60 mg/hr (10/10/20 0811)  . heparin 1,400 Units/hr (10/10/20 0811)  . milrinone 0.25 mcg/kg/min (10/10/20 [id number])    Assessment & Plan:   Principal Problem:   Acute CHF (congestive heart failure) (HCC) Active Problems:   Acute kidney injury superimposed on chronic kidney disease (HCC)   Essential hypertension   Elevated d-dimer   Obesity (BMI 30-39.9)   Diarrhea   Atrial flutter (HCC)   Acute biventricular H failure Echo with EF now 10-15% RV severely reduced  BNP elevated Cardiology following picc placed for cvp monitoring-on 3/17 3/18-TEE today with severe biv dysfunction, mod-se MR, sev TR, no LAA thrombus Continue iv lasix  Started on milrinone  Monitor lytes and renal function No bb/arb/dig/aldac with aki Unna boot added     Elevated D-dimer: Acute.  On admission D-dimer elevated at 4.72.  Unable to obtain a CT angiogram of the chest with contrast due to kidney function. 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  Baseline creatinine previously have been around 1.5.   Suspect hypoperfusion due to tachycardia and heart failure exacerbation. 3/18-improving slowly with diuresis, creatinine 2.44 today Continue IV Lasix Hold nephrotoxic meds    Aflutter rvr Rate not controlled 3/18-TEE CV today, DCCV unsuccessful Continue amiodarone drip Continue IV heparin     Diabetes mellitus type 2, hypoglycemia: Acute.  On admission glucose 63.  Last hemoglobin A1c 6.5 in  03/2020.  Home medications include glipizide 5 mg daily, and Janumet 25- 500 mg tablet twice daily. 3/18-BG control  Continue R-ISS      Fall and  generalized weakness: Patient presents after having a fall at home.  He reportedly has been having diarrhea all day yesterday which may be a contributing factor to his weakness. PT      Diarrhea: Acute.  Patient has not recently been on antibiotics, but reportedly has intermittent episodes of diarrhea. -Monitor intake and output -Consider checking C. difficile if diarrhea persist 3/17-none reported  Essential hypertension: On admission blood pressures were initially noted to be soft as 91/63. -Discontinued nifedipine per cardiology Hyperlipidemia -Continue atorvastatin 20 mg nightly  Prolonged QT interval: QTC 516  -Avoid QT prolonging medications  Obesity   DVT prophylaxis: heparin Code Status:FULL Family Communication: none at bedside  Status is: Inpatient  Remains inpatient appropriate because:Inpatient level of care appropriate due to severity of illness   Dispo: The patient is from: Home              Anticipated d/c is to: Home              Patient currently is not medically stable to d/c.   Difficult to place patient No  Patient still requires IV medications and is not medically stable.          LOS: 3 days   Time spent: 35 min with >50% on coc   Nolberto Hanlon, MD [facility] Pager [phone]  If 7PM-7AM, please contact night-coverage 10/10/2020, 12:33 PM

## 2020-10-10 NOTE — Anesthesia Procedure Notes (Signed)
Procedure Name: MAC Date/Time: 10/10/2020 8:20 AM Performed by: Kathryne Hitch, CRNA Pre-anesthesia Checklist: Patient identified, Emergency Drugs available, Suction available and Patient being monitored Patient Re-evaluated:Patient Re-evaluated prior to induction Oxygen Delivery Method: Nasal cannula Preoxygenation: Pre-oxygenation with 100% oxygen Induction Type: IV induction Placement Confirmation: positive ETCO2 Tube secured with: Tape

## 2020-10-10 NOTE — Progress Notes (Addendum)
Advanced Heart Failure Rounding Note  PCP-Cardiologist: No primary care provider on file.    Patient Profile   80 y/o male with obesity and HTN. Admitted with acute HF in setting of AF with RVR of unknown duration.   Echo reviewed personally and shows severe biventricular dysfunction. LVEF 10-15%. RV severely reduced.   PICC placed with initial co-ox 40% suggesting markedly low output. CVP up   Subjective:    TEE today w/ severe Biv dysfunction, Mod-severe MR, severe TR. No LAA thrombus  DCCV unsuccessful   Remains on Amio gtt at 60/hr. HR 120s   On Milrinone 0.25. Co-ox improved, 61% today  CVP 16   -1.6L in UOP yesterday but  Net + 512 cc  SCr trending down 2.69>>2.51>>2.44   Objective:   Weight Range: 116.6 kg Body mass index is 41.49 kg/m.   Vital Signs:   Temp:  [97.3 F (36.3 C)-98.2 F (36.8 C)] 97.8 F (36.6 C) (03/18 1000) Pulse Rate:  [61-129] 98 (03/18 1000) Resp:  [17-24] 18 (03/18 1100) BP: (85-126)/(53-87) 104/86 (03/18 1000) SpO2:  [95 %-100 %] 98 % (03/18 1000) Weight:  [116.6 kg] 116.6 kg (03/18 0115) Last BM Date: 10/09/20  Weight change: Filed Weights   10/09/20 0005 10/09/20 0500 10/10/20 0115  Weight: 117.6 kg 117.6 kg 116.6 kg    Intake/Output:   Intake/Output Summary (Last 24 hours) at 10/10/2020 1143 Last data filed at 10/10/2020 1000 Gross per 24 hour  Intake 2250.76 ml  Output 1627 ml  Net 623.76 ml      Physical Exam    General:  Chronically ill appearing, obese. No resp difficulty HEENT: Normal Neck: Supple. JVP elevated to ear . Carotids 2+ bilat; no bruits. No lymphadenopathy or thyromegaly appreciated. Cor: PMI nondisplaced. Irregularly irreguarlly rhythm, tachy rate. No rubs, gallops or murmurs. Lungs: decrease BS at the bases bilaterally  Abdomen: Soft, nontender, nondistended. No hepatosplenomegaly. No bruits or masses. Good bowel sounds. Extremities: No cyanosis, clubbing, rash, 2+ bilateral LEE up to  thighs  Neuro: Alert & orientedx3, cranial nerves grossly intact. moves all 4 extremities w/o difficulty. Affect pleasant   Telemetry   Afib w/ RVR 120s   EKG    No new EKG   Labs    CBC Recent Labs    10/09/20 0404 10/10/20 0707  WBC 6.1 5.5  HGB 12.3* 11.1*  HCT 37.4* 34.3*  MCV 86.0 86.8  PLT 105* [id number]   Basic Metabolic Panel Recent Labs    10/07/20 2011 10/08/20 0119 10/09/20 0404 10/10/20 0707  NA 135   < > 137 134*  K 5.1   < > 4.5 3.6  CL 112*   < > 113* 106  CO2 12*   < > 13* 17*  GLUCOSE 140*   < > 105* 228*  BUN 49*   < > 48* 45*  CREATININE 2.63*   < > 2.51* 2.44*  CALCIUM 8.0*   < > 8.1* 7.8*  MG 1.7  --   --   --    < > = values in this interval not displayed.   Liver Function Tests No results for input(s): AST, ALT, ALKPHOS, BILITOT, PROT, ALBUMIN in the last 72 hours. No results for input(s): LIPASE, AMYLASE in the last 72 hours. Cardiac Enzymes No results for input(s): CKTOTAL, CKMB, CKMBINDEX, TROPONINI in the last 72 hours.  BNP: BNP (last 3 results) Recent Labs    10/07/20 0229  BNP 4,299.0*    ProBNP (last 3  results) No results for input(s): PROBNP in the last 8760 hours.   D-Dimer No results for input(s): DDIMER in the last 72 hours. Hemoglobin A1C Recent Labs    10/07/20 1300  HGBA1C 5.9*   Fasting Lipid Panel No results for input(s): CHOL, HDL, LDLCALC, TRIG, CHOLHDL, LDLDIRECT in the last 72 hours. Thyroid Function Tests Recent Labs    10/07/20 1300  TSH 0.720    Other results:   Imaging    ECHO TEE  Result Date: 10/10/2020    TRANSESOPHOGEAL ECHO REPORT   Patient Name:   Kyle Hicks Mittman Date of Exam: 10/10/2020 Medical Rec #:  [id number]      Height:       66.0 in Accession #:    [id number]     Weight:       257.1 lb Date of Birth:  Nov 05, 1940      BSA:          2.225 m Patient Age:    64 years       BP:           126/75 mmHg Patient Gender: M              HR:           127 bpm. Exam Location:  Inpatient  Procedure: 2D Echo, Cardiac Doppler and Color Doppler Indications:     R94.31 Abnormal EKG; I48.91* Unspeicified atrial fibrillation  History:         Patient has prior history of Echocardiogram examinations, most                  recent 10/08/2020. CHF; Risk Factors:Hypertension, Diabetes and                  Dyslipidemia.  Sonographer:     Roseanna Rainbow RDCS Referring Phys:  [place] Diagnosing Phys: Kirk Ruths MD PROCEDURE: After discussion of the risks and benefits of a TEE, an informed consent was obtained from the patient. The transesophogeal probe was passed without difficulty through the esophogus of the patient. Imaged were obtained with the patient in a left lateral decubitus position. Sedation performed by different physician. The patient was monitored while under deep sedation. The patient developed no complications during the procedure. An unsuccessful direct current cardioversion was performed at 200 joules with 3 attempts. Ketamine '50mg'$ . IMPRESSIONS  1. Left ventricular ejection fraction, by estimation, is <20%. The left ventricle has severely decreased function. The left ventricle demonstrates global hypokinesis. The left ventricular internal cavity size was mildly dilated.  2. Right ventricular systolic function is severely reduced. The right ventricular size is severely enlarged.  3. Left atrial size was moderately dilated. No left atrial/left atrial appendage thrombus was detected.  4. Right atrial size was moderately dilated.  5. The mitral valve is abnormal. 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  6. Tricuspid valve regurgitation is severe.  7. The aortic valve is tricuspid. Aortic valve regurgitation is mild. Mild aortic valve sclerosis is present, with no evidence of aortic valve stenosis.  8. Aortic dilatation noted. There is mild dilatation of the aortic root, measuring 40 mm. FINDINGS  Left Ventricle: Left ventricular ejection fraction, by estimation, is <20%. The left  ventricle has severely decreased function. The left ventricle demonstrates global hypokinesis. The left ventricular internal cavity size was mildly dilated. Right Ventricle: The right ventricular size is severely enlarged. Right vetricular wall thickness was not assessed. Right ventricular systolic function is severely reduced. Left Atrium:  Left atrial size was moderately dilated. Spontaneous echo contrast was present in the left atrium and left atrial appendage. No left atrial/left atrial appendage thrombus was detected. Right Atrium: Right atrial size was moderately dilated. Pericardium: There is no evidence of pericardial effusion. Mitral Valve: The mitral valve is abnormal. 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 Tricuspid Valve: The tricuspid valve is normal in structure. Tricuspid valve regurgitation is severe. Aortic Valve: The aortic valve is tricuspid. Aortic valve regurgitation is mild. Mild aortic valve sclerosis is present, with no evidence of aortic valve stenosis. Pulmonic Valve: The pulmonic valve was grossly normal. Pulmonic valve regurgitation is trivial. Aorta: Aortic dilatation noted. There is mild dilatation of the aortic root, measuring 40 mm. IAS/Shunts: No atrial level shunt detected by color flow Doppler. Kirk Ruths MD Electronically signed by Kirk Ruths MD Signature Date/Time: 10/10/2020/10:31:47 AM    Final       Medications:     Scheduled Medications: . atorvastatin  20 mg Oral Daily  . Chlorhexidine Gluconate Cloth  6 each Topical Daily  . furosemide  80 mg Intravenous BID  . insulin aspart  0-6 Units Subcutaneous Q4H  . pantoprazole  40 mg Oral Daily  . sodium chloride flush  3 mL Intravenous Q12H     Infusions: . sodium chloride    . amiodarone 60 mg/hr (10/10/20 0811)  . heparin 1,400 Units/hr (10/10/20 0811)  . milrinone 0.25 mcg/kg/min (10/10/20 0811)     PRN Medications:  sodium chloride, acetaminophen, albuterol, dextrose, ondansetron  (ZOFRAN) IV, sodium chloride flush, sodium chloride flush     Assessment/Plan    1. A Flutter RVR - Started on amiodarone drip. Remains tachycardic with rates > 130  - Failed attempt at DCCV 3/18 - Continue IV amiodarone load, repeat DCCV next week - TSH normal - Continue IV heparin   2. 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  - Echo this admit w/ severe biventricular HF with EF < 15%.  BNP > 4000 . Suspect tachy-mediated. HS Trop - no trend. No chest pain. No LHC for now. Could consider ischemic work up later if renal function improves.  - PICC placed initial Co-ox 40%. Started Milrinone 0.25 - Co-ox 61% today  - Remains markedly volume overloaded - Continue IV Lasix 80 mg bid - Add 2.5 mg metolazone x 1 to augment diuresis. Supp K May need lasix gtt  - No bb w/ low output  - No arb/dig/spiro with AKI  - c/w unna boots.   3. AKI on CKD Stage IIIa -Creatinine on admit 2.8.  Most recent creatinine was 1.55 back in 03/2020. - Suspect this is due to cardiogenic shock.  - Has not been taking NSAIDs.  - Creatinine improving w/ diuresis 2.8>2.7>>2.5>>2.4 - Follow renal function daily  4. DMII - Hgb A1C 5.9  - On SSI  5. Lower Extremity Edema -Dopplers negative for DVT - 2/2 CHF Continue diuretics + unna boots   6. 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  -Add PPI  7. HTN  - Pressures soft but stable - monitor w/ afib and w/ diuresis     Length of Stay: 3  Brittainy Simmons, PA-C  10/10/2020, 11:43 AM  Advanced Heart Failure Team Pager [phone] (M-F; 7a - 5p)  Please contact [name] for night-coverage after hours (4p -7a ) and weekends on [email or web address]   Patient seen and examined with the above-signed Advanced Practice Provider and/or Housestaff. I personally reviewed laboratory data,  imaging studies and relevant notes. I independently examined the patient and formulated the important aspects of the plan. I have edited  the note to reflect any of my changes or salient points. I have personally discussed the plan with the patient and/or family.  Remains on milrinone for low output HF. TEE today showed severe biventricular dysfunction. Failed DC-CV x3. Remains in AF with RVR with rates 150-160 despite IV amio at 60   General: Sitting up in bed. No resp difficulty HEENT: normal Neck: supple. JVP up Carotids 2+ bilat; no bruits. No lymphadenopathy or thryomegaly appreciated. Cor: PMI nondisplaced. Irregular. Tachy.  No rubs, gallops or murmurs. Lungs: clear Abdomen: obese soft, nontender, nondistended. No hepatosplenomegaly. No bruits or masses. Good bowel sounds. Extremities: no cyanosis, clubbing, rash, 2+ edema Neuro: alert & orientedx3, cranial nerves grossly intact. moves all 4 extremities w/o difficulty. Affect pleasant  He has severe biventricular HF with marked volume overload. Co-ox 61% on milrinone. Failed DC-CV. Remains in AF - going fast. Will continue amio load. Plan repeat DC-CV early next week. Continue aggressive diuresis.   Glori Bickers, MD  8:23 PM

## 2020-10-10 NOTE — Progress Notes (Signed)
[place] for Xarelto>Heparin Indication: atrial fibrillation  No Known Allergies  Patient Measurements: Height: '5\' 6"'$  (167.6 cm) Weight: 116.6 kg (257 lb 0.9 oz) IBW/kg (Calculated) : 63.8 Heparin Dosing Weight: 89.3 kg  Vital Signs: Temp: 98 F (36.7 C) (03/18 1200) Temp Source: Oral (03/18 1200) BP: 108/70 (03/18 1200) Pulse Rate: 60 (03/18 1200)  Labs: Recent Labs    10/08/20 0119 10/08/20 1124 10/09/20 0404 10/09/20 1220 10/09/20 2300 10/10/20 0157 10/10/20 0707 10/10/20 1420  HGB 12.2*  --  12.3*  --   --   --  11.1*  --   HCT 36.4*  --  37.4*  --   --   --  34.3*  --   PLT 151  --  105*  --   --   --  127*  --   APTT  --    < >  --  35 145* 199*  --  91*  HEPARINUNFRC 0.13*   < >  --  1.58* 1.08*  --   --  0.66  CREATININE 2.69*  --  2.51*  --   --   --  2.44*  --    < > = values in this interval not displayed.    Estimated Creatinine Clearance: 30.4 mL/min (A) (by C-G formula based on SCr of 2.44 mg/dL (H)).   Assessment: 80 yo M with new Afib with RVR. No AC noted PTA. Was started on heparin then switched to Xarelto 3/16 and received 1 dose. Pharmacy asked to switch back to IV heparin. Patient now s/p unsuccessful DCCV this morning. Plans to try again next week.  Heparin level 0.66, aPTT 91 sec, both therapeutic and correlating. Will dose based on HL moving forward. No bleeding noted. Confirmed with RN that heparin running peripherally and labs drawn from PICC line.   Goal of Therapy:  Heparin level 0.3-0.7 units/ml aPTT 66-102 seconds Monitor platelets by anticoagulation protocol: Yes   Plan:  Continue heparin at 1400 units/hr Check next HL with AM labs Daily HL, CBC, s/sx bleeding  Richardine Service, PharmD, BCPS PGY2 Cardiology Pharmacy Resident Phone: [phone] 10/10/2020  3:46 PM  Please check [email or web address] for unit-specific pharmacy phone numbers.

## 2020-10-10 NOTE — Progress Notes (Addendum)
    Transesophageal Echocardiogram Note  Kyle Hicks [id number] 02-Feb-1941  Procedure: Transesophageal Echocardiogram Indications: Atrial flutter  Procedure Details Consent: Obtained Time Out: Verified patient identification, verified procedure, site/side was marked, verified correct patient position, special equipment/implants available, Radiology Safety Procedures followed,  medications/allergies/relevent history reviewed, required imaging and test results available.  Performed  Medications:  Pt sedated by anesthesia.  Severe LV dysfunction; severe RV dysfunction; moderate to severe MR; severe TR; mild AI; no LAA thrombus (spontaneous contrast noted).   Pt subsequently underwent synchronized DCCV with 120J, 200J and 200J with external pressure unsuccessful. Continue full amiodarone load and repeat attempt at [place] next week.   Complications: No apparent complications Patient did tolerate procedure well.  Kirk Ruths, MD

## 2020-10-10 NOTE — Interval H&P Note (Signed)
History and Physical Interval Note:  10/10/2020 7:24 AM  Kyle Hicks  has presented today for surgery, with the diagnosis of AFIB.  The various methods of treatment have been discussed with the patient and family. After consideration of risks, benefits and other options for treatment, the patient has consented to  Procedure(s): TRANSESOPHAGEAL ECHOCARDIOGRAM (TEE) (N/A) CARDIOVERSION (N/A) as a surgical intervention.  The patient's history has been reviewed, patient examined, no change in status, stable for surgery.  I have reviewed the patient's chart and labs.  Questions were answered to the patient's satisfaction.     Kirk Ruths

## 2020-10-10 NOTE — Progress Notes (Signed)
   10/09/20 1937  Assess: MEWS Score  Temp 97.7 F (36.5 C)  BP 108/77  Pulse Rate (!) 129  ECG Heart Rate (!) 128  Resp (!) 23  SpO2 99 %  Assess: MEWS Score  MEWS Temp 0  MEWS Systolic 0  MEWS Pulse 2  MEWS RR 1  MEWS LOC 0  MEWS Score 3  MEWS Score Color Yellow  Assess: if the MEWS score is Yellow or Red  Were vital signs taken at a resting state? Yes  Focused Assessment No change from prior assessment  Early Detection of Sepsis Score *See Row Information* Low  MEWS guidelines implemented *See Row Information* No, previously yellow, continue vital signs every 4 hours  Document  Patient Outcome Stabilized after interventions  Progress note created (see row info) Yes

## 2020-10-10 NOTE — TOC Progression Note (Signed)
Transition of Care (TOC) - Progression Note Heart Failure   Patient Details  Name: RONDALE WASSENBERG MRN: [id number] Date of Birth: Nov 02, 1940  Transition of Care [facility]) CM/SW [place], [place] Phone Number: 10/10/2020, 1:19 PM  Clinical Narrative:    CSW received consult for home health PT at time of discharge. CSW spoke with patient. Patient reported that patient's spouse is currently unable to care for patient at their home alone given patient's current physical needs and fall risk. Patient expressed understanding of HHPT recommendation and is agreeable to home health PT at time of discharge. Patient reports preference for whoever will take his health insurance and his wife is agreeable with this. CSW provided Mr. Betsill with a Medicare [place] ratings list. Patient has received the COVID vaccines. Patient expressed being hopeful for HHPT and to feel better soon. Patient reported no medical equipment needs but after talking with the patients wife she reported they need a walker without wheels and PT note reports a 3in1. CSW will need DME orders in to request equipment. No further questions reported at this time.   TOC will continue to follow through discharge.    Expected Discharge Plan: [place] Barriers to Discharge: Continued Medical Work up  Expected Discharge Plan and Services Expected Discharge Plan: [place] In-house Referral: Clinical Social Work Discharge Planning Services: CM Consult Post Acute Care Choice: [place] arrangements for the past 2 months: Single Family Home                 DME Arranged: N/A (Pt. reports none needed) DME Agency: NA       HH Arranged: PT,RN [place]: [facility] [employer] Date [place]: 10/10/20 Time [place]: 1150 Representative spoke with at [place]: [place]   Social Determinants of Health ([place]) Interventions    Readmission Risk  Interventions No flowsheet data found.   Lily Velasquez, MSW, [place] Heart Failure Social Worker

## 2020-10-10 NOTE — Progress Notes (Signed)
   10/09/20 1938  Assess: MEWS Score  Pulse Rate (!) 129  ECG Heart Rate (!) 128  Resp (!) 23  SpO2 98 %  Assess: MEWS Score  MEWS Temp 0  MEWS Systolic 0  MEWS Pulse 2  MEWS RR 1  MEWS LOC 0  MEWS Score 3  MEWS Score Color Yellow  Assess: if the MEWS score is Yellow or Red  Were vital signs taken at a resting state? Yes  Focused Assessment No change from prior assessment  Early Detection of Sepsis Score *See Row Information* Low  MEWS guidelines implemented *See Row Information* No, previously yellow, continue vital signs every 4 hours  Document  Patient Outcome Stabilized after interventions  Progress note created (see row info) Yes

## 2020-10-11 DIAGNOSIS — I5023 Acute on chronic systolic (congestive) heart failure: Secondary | ICD-10-CM | POA: Diagnosis not present

## 2020-10-11 DIAGNOSIS — N179 Acute kidney failure, unspecified: Secondary | ICD-10-CM | POA: Diagnosis not present

## 2020-10-11 DIAGNOSIS — I4819 Other persistent atrial fibrillation: Secondary | ICD-10-CM | POA: Diagnosis not present

## 2020-10-11 DIAGNOSIS — R197 Diarrhea, unspecified: Secondary | ICD-10-CM | POA: Diagnosis not present

## 2020-10-11 DIAGNOSIS — I5021 Acute systolic (congestive) heart failure: Secondary | ICD-10-CM | POA: Diagnosis not present

## 2020-10-11 LAB — CBC
HCT: 33.2 % — ABNORMAL LOW (ref 39.0–52.0)
Hemoglobin: 11.2 g/dL — ABNORMAL LOW (ref 13.0–17.0)
MCH: 28.5 pg (ref 26.0–34.0)
MCHC: 33.7 g/dL (ref 30.0–36.0)
MCV: 84.5 fL (ref 80.0–100.0)
Platelets: 155 10*3/uL (ref 150–400)
RBC: 3.93 MIL/uL — ABNORMAL LOW (ref 4.22–5.81)
RDW: 19 % — ABNORMAL HIGH (ref 11.5–15.5)
WBC: 6.2 10*3/uL (ref 4.0–10.5)
nRBC: 0 % (ref 0.0–0.2)

## 2020-10-11 LAB — BASIC METABOLIC PANEL
Anion gap: 11 (ref 5–15)
BUN: 46 mg/dL — ABNORMAL HIGH (ref 8–23)
CO2: 18 mmol/L — ABNORMAL LOW (ref 22–32)
Calcium: 8.2 mg/dL — ABNORMAL LOW (ref 8.9–10.3)
Chloride: 107 mmol/L (ref 98–111)
Creatinine, Ser: 2.63 mg/dL — ABNORMAL HIGH (ref 0.61–1.24)
GFR, Estimated: 24 mL/min — ABNORMAL LOW (ref >60)
Glucose, Bld: 151 mg/dL — ABNORMAL HIGH (ref 70–99)
Potassium: 4.4 mmol/L (ref 3.5–5.1)
Sodium: 136 mmol/L (ref 135–145)

## 2020-10-11 LAB — COOXEMETRY PANEL
Carboxyhemoglobin: 0.8 % (ref 0.5–1.5)
Methemoglobin: 0.9 % (ref 0.0–1.5)
O2 Saturation: 57.4 %
Total hemoglobin: 11.2 g/dL — ABNORMAL LOW (ref 12.0–16.0)

## 2020-10-11 LAB — GLUCOSE, CAPILLARY
Glucose-Capillary: 125 mg/dL — ABNORMAL HIGH (ref 70–99)
Glucose-Capillary: 128 mg/dL — ABNORMAL HIGH (ref 70–99)
Glucose-Capillary: 130 mg/dL — ABNORMAL HIGH (ref 70–99)
Glucose-Capillary: 131 mg/dL — ABNORMAL HIGH (ref 70–99)
Glucose-Capillary: 133 mg/dL — ABNORMAL HIGH (ref 70–99)
Glucose-Capillary: 139 mg/dL — ABNORMAL HIGH (ref 70–99)
Glucose-Capillary: 153 mg/dL — ABNORMAL HIGH (ref 70–99)

## 2020-10-11 LAB — HEPARIN LEVEL (UNFRACTIONATED): Heparin Unfractionated: 0.52 IU/mL (ref 0.30–0.70)

## 2020-10-11 MED ORDER — FUROSEMIDE 10 MG/ML IJ SOLN
30.0000 mg/h | INTRAVENOUS | Status: DC
  Administered 2020-10-11 – 2020-10-12 (×2): 20 mg/h via INTRAVENOUS
  Administered 2020-10-13 – 2020-10-22 (×28): 30 mg/h via INTRAVENOUS
  Filled 2020-10-11 (×27): qty 20
  Filled 2020-10-11: qty 10
  Filled 2020-10-11 (×14): qty 20

## 2020-10-11 MED ORDER — METOLAZONE 5 MG PO TABS
5.0000 mg | ORAL_TABLET | Freq: Once | ORAL | Status: AC
  Administered 2020-10-11: 5 mg via ORAL
  Filled 2020-10-11: qty 1

## 2020-10-11 NOTE — Evaluation (Signed)
Occupational Therapy Evaluation Patient Details Name: Kyle Hicks MRN: [id number] DOB: 01/19/1941 Today's Date: 10/11/2020    History of Present Illness Pt is a 80 y.o. male admitted 10/07/20 with c/o diarrhea, weakness and fall at home. CXR with enlarged cardiac sillhouette, small R-side pleural effusion. Pt also with tachycardia, HF, AKI. Pt with aflutter RVR 3/17. 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 PMH includes HTN, CHF, DM2, CKD IIIa, PUD, GIB.   Clinical Impression   Pt is typically mod I for mobility, but gets assist from wife for LB dressing - otherwise mod I for ADL. Today Pt is mod A for LB dressing, min A +2 (safety - line management) for transfers and set up for grooming/UB ADL in seated position. Pt limited by tachy HR 113-157 with activity sustaining in 150's when in standing and returned to 120's seated in recliner. Pt will benefit from skilled OT in the acute setting with focus on energy conservation next session and education in AE for LB ADL (thinking specifically long handle sponge and sock/reacher.  Pt will benefit from [facility] post-acute to maximize safety and independence in ADL and functional transfers.     Follow Up Recommendations  Home health OT;Supervision/Assistance - 24 hour (initially)    Equipment Recommendations  3 in 1 bedside commode (already in room)    Recommendations for Other Services       Precautions / Restrictions Precautions Precautions: Fall;Other (comment) Precaution Comments: Watch HR - tachycardia Restrictions Weight Bearing Restrictions: No      Mobility Bed Mobility Overal bed mobility: Needs Assistance Bed Mobility: Supine to Sit     Supine to sit: Min assist;HOB elevated     General bed mobility comments: Increased time and effort with use of bed rails, min A to assist trunk elevation and scoot hips to EOB - improved effort and performance since initial  evaluation    Transfers Overall transfer level: Needs assistance Equipment used: Rolling walker (2 wheeled) Transfers: Sit to/from Stand Sit to Stand: Min assist;From elevated surface Stand pivot transfers: Min assist;+2 safety/equipment       General transfer comment: Reliant on momentum and minA to power into standing from EOB to RW, vc for safe hand placement and sequencing/safety/line management    Balance Overall balance assessment: Needs assistance Sitting-balance support: No upper extremity supported;Feet supported Sitting balance-[name] Scale: Fair     Standing balance support: Bilateral upper extremity supported Standing balance-[name] Scale: Poor Standing balance comment: reliant on BUE support of RW                           ADL either performed or assessed with clinical judgement   ADL Overall ADL's : Needs assistance/impaired Eating/Feeding: Modified independent;Sitting   Grooming: Wash/dry face;Oral care;Set up;Sitting Grooming Details (indicate cue type and reason): in recliner Upper Body Bathing: Minimal assistance;Sitting   Lower Body Bathing: Moderate assistance;Sitting/lateral leans   Upper Body Dressing : Set up;Sitting   Lower Body Dressing: Moderate assistance;Sit to/from stand   Toilet Transfer: Minimal assistance;+2 for safety/equipment;Ambulation;RW Toilet Transfer Details (indicate cue type and reason): short distance to [facility] Toileting- Clothing Manipulation and Hygiene: Maximal assistance;Sit to/from stand Toileting - Clothing Manipulation Details (indicate cue type and reason): Pt in standing, OT performed rear peri care     Functional mobility during ADLs: Minimal assistance;+2 for safety/equipment;Rolling walker;Cueing for safety;Cueing for sequencing General ADL Comments: wife assists at baseline for LB dressing,  Pt limited by SOB (O2 WFL) and HR     Vision         Perception     Praxis      Pertinent Vitals/Pain Pain  Assessment: No/denies pain     Hand Dominance     Extremity/Trunk Assessment Upper Extremity Assessment Upper Extremity Assessment: Overall WFL for tasks assessed   Lower Extremity Assessment Lower Extremity Assessment: Defer to PT evaluation   Cervical / Trunk Assessment Cervical / Trunk Assessment: Normal   Communication Communication Communication: HOH   Cognition Arousal/Alertness: Awake/alert Behavior During Therapy: WFL for tasks assessed/performed;Flat affect Overall Cognitive Status: No family/caregiver present to determine baseline cognitive functioning Area of Impairment: Following commands;Problem solving;Awareness                       Following Commands: Follows one step commands consistently   Awareness: Emergent Problem Solving: Slow processing;Requires verbal cues General Comments: vc for safety with lines and problem solving for how to manage them   General Comments  HR 113-157 (with activity - sustained) and recovered to 120's, Pt DOE 3/4 SpO2 remained >95% throughout session on RA    Exercises     Shoulder Instructions      Home Living Family/patient expects to be discharged to:: Private residence Living Arrangements: Spouse/significant other Available Help at Discharge: Family;Available 24 hours/day Type of Home: House Home Access: Stairs to enter [employer] of Steps: 4 Entrance Stairs-Rails: Can reach both Home Layout: One level         Bathroom Toilet: Standard Bathroom Accessibility: Yes   Home Equipment: Walker - 2 wheels;Cane - single point          Prior Functioning/Environment Level of Independence: Independent                 OT Problem List: Decreased strength;Decreased range of motion;Decreased activity tolerance;Impaired balance (sitting and/or standing);Decreased cognition;Decreased safety awareness;Decreased knowledge of use of DME or AE;Cardiopulmonary status limiting activity;Obesity      OT  Treatment/Interventions: Self-care/ADL training;Energy conservation;DME and/or AE instruction;Therapeutic activities;Patient/family education;Balance training    OT Goals(Current goals can be found in the care plan section) Acute Rehab OT Goals Patient Stated Goal: To improve strength and return to independence OT Goal Formulation: With patient Time For Goal Achievement: 10/25/20 Potential to Achieve Goals: Good ADL Goals Pt Will Perform Grooming: with modified independence;sitting Pt Will Perform Upper Body Bathing: with modified independence;sitting;with adaptive equipment Pt Will Perform Lower Body Bathing: with modified independence;with adaptive equipment;sitting/lateral leans Pt Will Transfer to Toilet: with modified independence;ambulating Pt Will Perform Toileting - Clothing Manipulation and hygiene: with modified independence;sit to/from stand Additional ADL Goal #1: Pt will verbalize 3 ways of conserving energy during ADL with zero cues  OT Frequency: Min 2X/week   Barriers to D/C:            Co-evaluation              AM-PAC OT "6 Clicks" Daily Activity     Outcome Measure Help from another person eating meals?: None Help from another person taking care of personal grooming?: A Little Help from another person toileting, which includes using toliet, bedpan, or urinal?: A Lot Help from another person bathing (including washing, rinsing, drying)?: A Lot Help from another person to put on and taking off regular upper body clothing?: A Little Help from another person to put on and taking off regular lower body clothing?: A Lot 6 Click Score: 16  End of Session Equipment Utilized During Treatment: Surveyor, mining Communication: Mobility status;Precautions  Activity Tolerance: Patient tolerated treatment well (limited by HR) Patient left: in chair;with call bell/phone within reach;with chair alarm set;with nursing/sitter in room  OT Visit Diagnosis: Unsteadiness  on feet (R26.81);Other abnormalities of gait and mobility (R26.89);Muscle weakness (generalized) (M62.81);Other symptoms and signs involving cognitive function                Time: [id number] OT Time Calculation (min): 12 min Charges:  OT General Charges $OT Visit: 1 Visit OT Evaluation $OT Eval Moderate Complexity: [place] OTR/L Acute Rehabilitation Services Pager: [phone] Office: [place] 10/11/2020, 11:48 AM

## 2020-10-11 NOTE — Progress Notes (Signed)
PROGRESS NOTE    [name]  [id number] DOB: August 31, 1940 DOA: 10/07/2020 PCP: Sharilyn Sites, MD    Brief Narrative:  Kyle Hicks is a 80 y.o. male with medical history significant of hypertension, diastolic CHF last EF [id number] with grade 1 diastolic dysfunction, diabetes mellitus type 2, and PUD with history of GI bleed presents after having a fall at home.  He reports that he had not been feeling well for the last 2 days.  This morning when he got up to use the restroom he reports that his left leg got weak and he fell to his side off the couch.  He did not hit his head or lose consciousness.  Denies any change in speech, vision, sensation.  He feels globally weak.  Noted associated symptoms of diarrhea, possibly some weight loss, leg swelling, shortness of breath, intermittent wheezing, and palpitations.  Patient has not noticed any blood or dark stools, but he had been getting up to have a stool every 25-30 minutes recently.  Denies having any significant cough, calf/leg pain, or orthopnea symptoms.  Wife notes that he had been very weak yesterday and stayed in the bathroom all day.  He had not been eating well and had been drinking Ensure recently.  3/17-monitor with a flutter RVR in the 130s 3/18-TEE cardioversion today was unsuccessful 3/19- monitor with afib rvr 125.  Overnight patient's heart rate remained in 140s and was given amiodarone iv load  Consultants:   cardiology  Procedures:   Antimicrobials:       Subjective: Denies sob, palpitations, cp or dizziness  Objective: Vitals:   10/11/20 0037 10/11/20 0039 10/11/20 0101 10/11/20 0316  BP:  118/63  112/69  Pulse: 75   81  Resp: (!) 22   18  Temp: 97.7 F (36.5 C)   98.1 F (36.7 C)  TempSrc: Oral   Oral  SpO2: 97%   100%  Weight:   118.3 kg   Height:        Intake/Output Summary (Last 24 hours) at 10/11/2020 0819 Last data filed at 10/11/2020 0101 Gross per 24 hour  Intake 1091.16 ml  Output 1050 ml   Net 41.16 ml   Filed Weights   10/09/20 0500 10/10/20 0115 10/11/20 0101  Weight: 117.6 kg 116.6 kg 118.3 kg    Examination: Sitting in chair +rales at bases, no w/r irrg-reg, tachy, s1/s2 Soft benign +bs +edema b/l Aaxoxo3, grossly intact     Data Reviewed: I have personally reviewed following labs and imaging studies  CBC: Recent Labs  Lab 10/07/20 0229 10/08/20 0119 10/09/20 0404 10/10/20 0707 10/11/20 0432  WBC 7.5 6.2 6.1 5.5 6.2  NEUTROABS 5.9  --   --   --   --   HGB 13.5 12.2* 12.3* 11.1* 11.2*  HCT 43.9 36.4* 37.4* 34.3* 33.2*  MCV 91.1 87.9 86.0 86.8 84.5  PLT 165 151 105* 127* [id number]   Basic Metabolic Panel: Recent Labs  Lab 10/07/20 0229 10/07/20 2011 10/08/20 0119 10/09/20 0404 10/10/20 0707 10/11/20 0432  NA 139 135 137 137 134* 136  K 5.1 5.1 5.1 4.5 3.6 4.4  CL 113* 112* 116* 113* 106 107  CO2 13* 12* 12* 13* 17* 18*  GLUCOSE 73 140* 117* 105* 228* 151*  BUN 47* 49* 51* 48* 45* 46*  CREATININE 2.78* 2.63* 2.69* 2.51* 2.44* 2.63*  CALCIUM 8.7* 8.0* 8.2* 8.1* 7.8* 8.2*  MG 1.9 1.7  --   --   --   --  GFR: Estimated Creatinine Clearance: 28.5 mL/min (A) (by C-G formula based on SCr of 2.63 mg/dL (H)). Liver Function Tests: Recent Labs  Lab 10/07/20 0229  AST 29  ALT 26  ALKPHOS 49  BILITOT 1.4*  PROT 7.0  ALBUMIN 3.4*   No results for input(s): LIPASE, AMYLASE in the last 168 hours. No results for input(s): AMMONIA in the last 168 hours. Coagulation Profile: No results for input(s): INR, PROTIME in the last 168 hours. Cardiac Enzymes: No results for input(s): CKTOTAL, CKMB, CKMBINDEX, TROPONINI in the last 168 hours. BNP (last 3 results) No results for input(s): PROBNP in the last 8760 hours. HbA1C: No results for input(s): HGBA1C in the last 72 hours. CBG: Recent Labs  Lab 10/10/20 1622 10/10/20 1945 10/11/20 0020 10/11/20 0418 10/11/20 0733  GLUCAP 154* 163* 153* 130* 133*   Lipid Profile: No results for input(s):  CHOL, HDL, LDLCALC, TRIG, CHOLHDL, LDLDIRECT in the last 72 hours. Thyroid Function Tests: No results for input(s): TSH, T4TOTAL, FREET4, T3FREE, THYROIDAB in the last 72 hours. Anemia Panel: No results for input(s): VITAMINB12, FOLATE, FERRITIN, TIBC, IRON, RETICCTPCT in the last 72 hours. Sepsis Labs: Recent Labs  Lab 10/07/20 0229 10/07/20 0429  LATICACIDVEN 2.0* 1.8    Recent Results (from the past 240 hour(s))  Resp Panel by RT-PCR (Flu A&B, Covid) Nasopharyngeal Swab     Status: None   Collection Time: 10/07/20  3:07 AM   Specimen: Nasopharyngeal Swab; Nasopharyngeal(NP) swabs in vial transport medium  Result Value Ref Range Status   SARS Coronavirus 2 by RT PCR NEGATIVE NEGATIVE Final    Comment: (NOTE) SARS-CoV-2 target nucleic acids are NOT DETECTED.  The SARS-CoV-2 RNA is generally detectable in upper respiratory specimens during the acute phase of infection. The lowest concentration of SARS-CoV-2 viral copies this assay can detect is 138 copies/mL. A negative result does not preclude SARS-Cov-2 infection and should not be used as the sole basis for treatment or other patient management decisions. A negative result may occur with  improper specimen collection/handling, submission of specimen other than nasopharyngeal swab, presence of viral mutation(s) within the areas targeted by this assay, and inadequate number of viral copies(<138 copies/mL). A negative result must be combined with clinical observations, patient history, and epidemiological information. The expected result is Negative.  Fact Sheet for Patients:  EntrepreneurPulse.com.au  Fact Sheet for Healthcare Providers:  IncredibleEmployment.be  This test is no t yet approved or cleared by the [place] FDA and  has been authorized for detection and/or diagnosis of SARS-CoV-2 by FDA under an Emergency Use Authorization (EUA). This EUA will remain  in effect (meaning  this test can be used) for the duration of the COVID-19 declaration under Section 564(b)(1) of the Act, 21 U.S.C.section 360bbb-3(b)(1), unless the authorization is terminated  or revoked sooner.       Influenza A by PCR NEGATIVE NEGATIVE Final   Influenza B by PCR NEGATIVE NEGATIVE Final    Comment: (NOTE) The Xpert Xpress SARS-CoV-2/FLU/RSV plus assay is intended as an aid in the diagnosis of influenza from Nasopharyngeal swab specimens and should not be used as a sole basis for treatment. Nasal washings and aspirates are unacceptable for Xpert Xpress SARS-CoV-2/FLU/RSV testing.  Fact Sheet for Patients: EntrepreneurPulse.com.au  Fact Sheet for Healthcare Providers: IncredibleEmployment.be  This test is not yet approved or cleared by the [place] FDA and has been authorized for detection and/or diagnosis of SARS-CoV-2 by FDA under an Emergency Use Authorization (EUA). This EUA will remain in  effect (meaning this test can be used) for the duration of the COVID-19 declaration under Section 564(b)(1) of the Act, 21 U.S.C. section 360bbb-3(b)(1), unless the authorization is terminated or revoked.  Performed at [facility], [address] [place], [place] [address]          Radiology Studies: DG CHEST PORT 1 VIEW  Result Date: 10/09/2020 CLINICAL DATA:  PICC line placement. EXAM: PORTABLE CHEST 1 VIEW COMPARISON:  02/23/2013 FINDINGS: Right arm PICC line tip is in the projection of the distal SVC. Stable mild cardiac enlargement. Small right pleural effusion unchanged from previous exam. No airspace opacities or interstitial edema. IMPRESSION: 1. Tip of right arm PICC line is in the projection of the distal SVC. 2. Persistent small right pleural effusion. Electronically Signed   By: Kerby Moors M.D.   On: 10/09/2020 11:07   ECHO TEE  Result Date: 10/10/2020    TRANSESOPHOGEAL ECHO REPORT   Patient Name:   BENZ CHANCE Fannin  Date of Exam: 10/10/2020 Medical Rec #:  [id number]      Height:       66.0 in Accession #:    [id number]     Weight:       257.1 lb Date of Birth:  1941-02-27      BSA:          2.225 m Patient Age:    68 years       BP:           126/75 mmHg Patient Gender: M              HR:           127 bpm. Exam Location:  Inpatient Procedure: 2D Echo, Cardiac Doppler and Color Doppler Indications:     R94.31 Abnormal EKG; I48.91* Unspeicified atrial fibrillation  History:         Patient has prior history of Echocardiogram examinations, most                  recent 10/08/2020. CHF; Risk Factors:Hypertension, Diabetes and                  Dyslipidemia.  Sonographer:     Roseanna Rainbow RDCS Referring Phys:  [place] Diagnosing Phys: Kirk Ruths MD PROCEDURE: After discussion of the risks and benefits of a TEE, an informed consent was obtained from the patient. The transesophogeal probe was passed without difficulty through the esophogus of the patient. Imaged were obtained with the patient in a left lateral decubitus position. Sedation performed by different physician. The patient was monitored while under deep sedation. The patient developed no complications during the procedure. An unsuccessful direct current cardioversion was performed at 200 joules with 3 attempts. Ketamine '50mg'$ . IMPRESSIONS  1. Left ventricular ejection fraction, by estimation, is <20%. The left ventricle has severely decreased function. The left ventricle demonstrates global hypokinesis. The left ventricular internal cavity size was mildly dilated.  2. Right ventricular systolic function is severely reduced. The right ventricular size is severely enlarged.  3. Left atrial size was moderately dilated. No left atrial/left atrial appendage thrombus was detected.  4. Right atrial size was moderately dilated.  5. The mitral valve is abnormal. 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  6. Tricuspid valve regurgitation is severe.  7. The aortic  valve is tricuspid. Aortic valve regurgitation is mild. Mild aortic valve sclerosis is present, with no evidence of aortic valve stenosis.  8. Aortic dilatation noted. There  is mild dilatation of the aortic root, measuring 40 mm. FINDINGS  Left Ventricle: Left ventricular ejection fraction, by estimation, is <20%. The left ventricle has severely decreased function. The left ventricle demonstrates global hypokinesis. The left ventricular internal cavity size was mildly dilated. Right Ventricle: The right ventricular size is severely enlarged. Right vetricular wall thickness was not assessed. Right ventricular systolic function is severely reduced. Left Atrium: Left atrial size was moderately dilated. Spontaneous echo contrast was present in the left atrium and left atrial appendage. No left atrial/left atrial appendage thrombus was detected. Right Atrium: Right atrial size was moderately dilated. Pericardium: There is no evidence of pericardial effusion. Mitral Valve: The mitral valve is abnormal. 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 Tricuspid Valve: The tricuspid valve is normal in structure. Tricuspid valve regurgitation is severe. Aortic Valve: The aortic valve is tricuspid. Aortic valve regurgitation is mild. Mild aortic valve sclerosis is present, with no evidence of aortic valve stenosis. Pulmonic Valve: The pulmonic valve was grossly normal. Pulmonic valve regurgitation is trivial. Aorta: Aortic dilatation noted. There is mild dilatation of the aortic root, measuring 40 mm. IAS/Shunts: No atrial level shunt detected by color flow Doppler. Kirk Ruths MD Electronically signed by Kirk Ruths MD Signature Date/Time: 10/10/2020/10:31:47 AM    Final    [place] EKG SITE RITE  Result Date: 10/09/2020 If Site Rite image not attached, placement could not be confirmed due to current cardiac rhythm.       Scheduled Meds: . atorvastatin  20 mg Oral Daily  . Chlorhexidine Gluconate Cloth  6 each  Topical Daily  . furosemide  80 mg Intravenous BID  . insulin aspart  0-6 Units Subcutaneous Q4H  . pantoprazole  40 mg Oral Daily  . sodium chloride flush  3 mL Intravenous Q12H   Continuous Infusions: . sodium chloride    . amiodarone 60 mg/hr (10/11/20 0423)  . heparin 1,400 Units/hr (10/11/20 0041)  . milrinone 0.25 mcg/kg/min (10/11/20 0026)    Assessment & Plan:   Principal Problem:   Acute CHF (congestive heart failure) (HCC) Active Problems:   Acute kidney injury superimposed on chronic kidney disease (HCC)   Essential hypertension   Elevated d-dimer   Obesity (BMI 30-39.9)   Diarrhea   Atrial flutter (HCC)   Atrial fibrillation (HCC)   Acute biventricular H failure Echo with EF now 10-15% RV severely reduced  BNP elevated Cardiology following picc placed for cvp monitoring-on 3/17 3/18-TEE today with severe biv dysfunction, mod-se MR, sev TR, no LAA thrombus 3/19-remains volume overloaded  Per cardiology since he is not responding to Lasix 80 IV twice daily metolazone will switch to Lasix drip at 20  No beta-blocker  No acei/arb/dig/spirono. Due to AKI Unna boots    Elevated D-dimer: Acute.  On admission D-dimer elevated at 4.72.  Unable to obtain a CT angiogram of the chest with contrast due to kidney function. -Heparin drip per pharmacy -Check VQ scan once able 3/19-doplper negative for dvt    Acute kidney injury superimposed on chronic kidney disease stage IIIb: Patient presents with creatinine elevated up to 2.78 with BUN 47.  Baseline creatinine previously have been around 1.5.   Suspect hypoperfusion due to tachycardia and heart failure exacerbation./Cardiorenal syndrome 3/19-renal function mildly elevated at 2.63  Hold nephrotoxic meds  Continue diuresis as above    Aflutter rvr Rate not well controlled stable tachycardic >110-130's Failed DCCV 3/18 Continue IV amiodarone load, plan to repeat DCCV next week  TSH normal  Continue IV  heparin      Diabetes mellitus type 2, hypoglycemia: Acute.  On admission glucose 63.  Last hemoglobin A1c 6.5 in 03/2020.  Home medications include glipizide 5 mg daily, and Janumet 25- 500 mg tablet twice daily. 3/19 BG overall controlled Continue R-ISS      Fall and generalized weakness: Patient presents after having a fall at home.  He reportedly has been having diarrhea all day yesterday which may be a contributing factor to his weakness. PT      Diarrhea: Acute.  Patient has not recently been on antibiotics, but reportedly has intermittent episodes of diarrhea. -Monitor intake and output -Consider checking C. difficile if diarrhea persist 3/17-none reported  Essential hypertension: On admission blood pressures were initially noted to be soft as 91/63. -Discontinued nifedipine per cardiology Hyperlipidemia -Continue atorvastatin 20 mg nightly  Prolonged QT interval: QTC 516  -Avoid QT prolonging medications  Obesity   DVT prophylaxis: heparin Code Status:FULL Family Communication: none at bedside  Status is: Inpatient  Remains inpatient appropriate because:Inpatient level of care appropriate due to severity of illness   Dispo: The patient is from: Home              Anticipated d/c is to: Home              Patient currently is not medically stable to d/c.   Difficult to place patient No  Patient still requires IV medications and is not medically stable.          LOS: 4 days   Time spent: 35 min with >50% on coc   Nolberto Hanlon, MD [facility] Pager [phone]  If 7PM-7AM, please contact night-coverage 10/11/2020, 8:19 AM

## 2020-10-11 NOTE — Plan of Care (Signed)
  Problem: Activity: Goal: Capacity to carry out activities will improve Outcome: Progressing   Problem: Clinical Measurements: Goal: Will remain free from infection Outcome: Progressing Goal: Respiratory complications will improve Outcome: Progressing   Problem: Activity: Goal: Risk for activity intolerance will decrease Outcome: Progressing   Problem: Safety: Goal: Ability to remain free from injury will improve Outcome: Progressing

## 2020-10-11 NOTE — Progress Notes (Signed)
[facility] for Xarelto>Heparin Indication: atrial fibrillation  No Known Allergies  Patient Measurements: Height: '5\' 6"'$  (167.6 cm) Weight: 118.3 kg (260 lb 12.9 oz) IBW/kg (Calculated) : 63.8 Heparin Dosing Weight: 89.3 kg  Vital Signs: Temp: 98.1 F (36.7 C) (03/19 0316) Temp Source: Oral (03/19 0316) BP: 112/69 (03/19 0316) Pulse Rate: 81 (03/19 0316)  Labs: Recent Labs    10/09/20 0404 10/09/20 1220 10/09/20 2300 10/10/20 0157 10/10/20 0707 10/10/20 1420 10/11/20 0432  HGB 12.3*  --   --   --  11.1*  --  11.2*  HCT 37.4*  --   --   --  34.3*  --  33.2*  PLT 105*  --   --   --  127*  --  155  APTT  --    < > 145* 199*  --  91*  --   HEPARINUNFRC  --    < > 1.08*  --   --  0.66 0.52  CREATININE 2.51*  --   --   --  2.44*  --  2.63*   < > = values in this interval not displayed.    Estimated Creatinine Clearance: 28.5 mL/min (A) (by C-G formula based on SCr of 2.63 mg/dL (H)).   Assessment: 80 yo M with new Afib with RVR. No AC noted PTA. Was started on heparin then switched to Xarelto 3/16 and received 1 dose. Pharmacy asked to switch back to IV heparin. Patient now s/p unsuccessful DCCV. Plans to try again next week.  HL therapeutic at 0.52 this morning. CBC stable, no s/sx bleeding noted.  Goal of Therapy:  Heparin level 0.3-0.7 units/ml Monitor platelets by anticoagulation protocol: Yes   Plan:  Continue heparin at 1400 units/hr Monitor daily HL, CBC, s/sx bleeding Follow plans for switching back to oral anticoagulation  Mercy Riding, PharmD PGY1 Acute Care Pharmacy Resident  Please check [email or web address] for unit-specific pharmacy phone numbers.

## 2020-10-11 NOTE — Progress Notes (Signed)
Physical Therapy Treatment Patient Details Name: Kyle Hicks MRN: [id number] DOB: 1940-10-04 Today's Date: 10/11/2020    History of Present Illness Pt is a 80 y.o. male admitted 10/07/20 with c/o diarrhea, weakness and fall at home. CXR with enlarged cardiac sillhouette, small R-side pleural effusion. Pt also with tachycardia, HF, AKI. Pt with aflutter RVR 3/17. 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 PMH includes HTN, CHF, DM2, CKD IIIa, PUD, GIB.   PT Comments    Pt able to progress OOB mobility this session with RW and minA+2 for safety. Pt remains limited by tachycardia and DOE with minimal exertion, HR up to 150s (RN aware). Pt with improved ability to mobilize compared to yesterday's session. Will continue to follow acutely.   Follow Up Recommendations  Home health PT;Supervision for mobility/OOB     Equipment Recommendations  3in1 (PT) (delivered)    Recommendations for Other Services       Precautions / Restrictions Precautions Precautions: Fall;Other (comment) Precaution Comments: Watch HR - tachycardia Restrictions Weight Bearing Restrictions: No    Mobility  Bed Mobility Overal bed mobility: Needs Assistance Bed Mobility: Supine to Sit     Supine to sit: Min assist;HOB elevated     General bed mobility comments: Increased time and effort with use of bed rails, min A to assist trunk elevation and scoot hips to EOB    Transfers Overall transfer level: Needs assistance Equipment used: Rolling walker (2 wheeled) Transfers: Sit to/from Stand Sit to Stand: Min assist;From elevated surface Stand pivot transfers: Min assist;+2 safety/equipment       General transfer comment: Reliant on momentum and minA to power into standing from EOB to RW; cues for safe hand placement and sequencing/safety/line management  Ambulation/Gait Ambulation/Gait assistance: +2 safety/equipment;Min  assist Gait Distance (Feet): 6 Feet Assistive device: Rolling walker (2 wheeled) Gait Pattern/deviations: Step-to pattern Gait velocity: Decreased   General Gait Details: Short ambulation distance to RW with minA for stability, +2 assist for line management/safety; deferred further distance secondary to tachycardia with symptomatic SOB   Stairs             Wheelchair Mobility    Modified Rankin (Stroke Patients Only)       Balance Overall balance assessment: Needs assistance Sitting-balance support: No upper extremity supported;Feet supported Sitting balance-[name] Scale: Fair     Standing balance support: Bilateral upper extremity supported Standing balance-[name] Scale: Poor Standing balance comment: reliant on BUE support                            Cognition Arousal/Alertness: Awake/alert Behavior During Therapy: WFL for tasks assessed/performed;Flat affect Overall Cognitive Status: No family/caregiver present to determine baseline cognitive functioning Area of Impairment: Following commands;Problem solving;Awareness                       Following Commands: Follows one step commands consistently   Awareness: Emergent Problem Solving: Slow processing;Requires verbal cues General Comments: vc for safety with lines and problem solving for how to manage them      Exercises      General Comments General comments (skin integrity, edema, etc.): HR up to 150s with activity; down to 110-130s with sitting. SpO2 >/94% on RA, although pt with SOB requiring cues for deep breathing. RN aware of tachycardia as well as bleeding from PICC line      Pertinent Vitals/Pain Pain Assessment: No/denies  pain    Home Living Family/patient expects to be discharged to:: Private residence Living Arrangements: Spouse/significant other Available Help at Discharge: Family;Available 24 hours/day Type of Home: House Home Access: Stairs to enter Entrance  Stairs-Rails: Can reach both Home Layout: One level Home Equipment: Walker - 2 wheels;Cane - single point      Prior Function Level of Independence: Independent          PT Goals (current goals can now be found in the care plan section) Acute Rehab PT Goals Patient Stated Goal: To improve strength and return to independence Progress towards PT goals: Progressing toward goals    Frequency    Min 3X/week      PT Plan Current plan remains appropriate    Co-evaluation              AM-PAC PT "6 Clicks" Mobility   Outcome Measure  Help needed turning from your back to your side while in a flat bed without using bedrails?: A Little Help needed moving from lying on your back to sitting on the side of a flat bed without using bedrails?: A Little Help needed moving to and from a bed to a chair (including a wheelchair)?: A Little Help needed standing up from a chair using your arms (e.g., wheelchair or bedside chair)?: A Little Help needed to walk in hospital room?: A Little Help needed climbing 3-5 steps with a railing? : A Lot 6 Click Score: 17    End of Session   Activity Tolerance: Treatment limited secondary to medical complications (Comment);Patient tolerated treatment well Patient left: in chair;with call bell/phone within reach;with chair alarm set;with nursing/sitter in room Nurse Communication: Mobility status PT Visit Diagnosis: Muscle weakness (generalized) (M62.81);Unsteadiness on feet (R26.81);Other abnormalities of gait and mobility (R26.89);History of falling (Z91.81)     Time: [id number] PT Time Calculation (min) (ACUTE ONLY): 14 min  Charges:  $Therapeutic Activity: 8-22 mins                     Mabeline Caras, PT, DPT Acute Rehabilitation Services  Pager [phone] Office [place] 10/11/2020, 1:28 PM

## 2020-10-11 NOTE — Progress Notes (Addendum)
Advanced Heart Failure Rounding Note  PCP-Cardiologist: No primary care provider on file.    Patient Profile   80 y/o male with obesity and HTN. Admitted with acute HF in setting of AF with RVR of unknown duration.   Echo LVEF 10-15%. RV severely reduced. PICC placed with initial co-ox 40% suggesting markedly low output. CVP up  Subjective:    TEE 3/18 w/ severe Biv dysfunction, Mod-severe MR, severe TR. No LAA thrombus. DCCV unsuccessful   Remains on Amio gtt at 60/hr - received multiple amio boluses yesterday. HR 110-130s   On Milrinone 0.25. Co-ox 57%  CVP 20   Very modest urine output. Weight up 3 pounds  SCr trending down 2.69>>2.51>>2.44>>2.63   Objective:   Weight Range: 118.3 kg Body mass index is 42.09 kg/m.   Vital Signs:   Temp:  [97.7 F (36.5 C)-98.3 F (36.8 C)] 98 F (36.7 C) (03/19 1143) Pulse Rate:  [68-132] 78 (03/19 1200) Resp:  [17-23] 17 (03/19 1143) BP: (106-118)/(53-73) 116/72 (03/19 1143) SpO2:  [91 %-100 %] 96 % (03/19 1200) Weight:  [118.3 kg] 118.3 kg (03/19 0101) Last BM Date: 10/10/20  Weight change: Filed Weights   10/09/20 0500 10/10/20 0115 10/11/20 0101  Weight: 117.6 kg 116.6 kg 118.3 kg    Intake/Output:   Intake/Output Summary (Last 24 hours) at 10/11/2020 1249 Last data filed at 10/11/2020 0900 Gross per 24 hour  Intake 979.94 ml  Output 1500 ml  Net -520.06 ml      Physical Exam    General:  Chronically ill appearing, obese. No resp difficulty HEENT: normal Neck: supple. JVP elevated Carotids 2+ bilat; no bruits. No lymphadenopathy or thryomegaly appreciated. Cor: PMI nondisplaced. Irregular tachy. No rubs, gallops or murmurs. Lungs: clear Abdomen: obese soft, nontender, nondistended. No hepatosplenomegaly. No bruits or masses. Good bowel sounds. Extremities: no cyanosis, clubbing, rash, 2-3+ edema Neuro: alert & orientedx3, cranial nerves grossly intact. moves all 4 extremities w/o difficulty. Affect  pleasant   Telemetry   Afib w/ RVR 110-130s  Personally reviewed  Labs    CBC Recent Labs    10/10/20 0707 10/11/20 0432  WBC 5.5 6.2  HGB 11.1* 11.2*  HCT 34.3* 33.2*  MCV 86.8 84.5  PLT 127* [id number]   Basic Metabolic Panel Recent Labs    10/10/20 0707 10/11/20 0432  NA 134* 136  K 3.6 4.4  CL 106 107  CO2 17* 18*  GLUCOSE 228* 151*  BUN 45* 46*  CREATININE 2.44* 2.63*  CALCIUM 7.8* 8.2*   Liver Function Tests No results for input(s): AST, ALT, ALKPHOS, BILITOT, PROT, ALBUMIN in the last 72 hours. No results for input(s): LIPASE, AMYLASE in the last 72 hours. Cardiac Enzymes No results for input(s): CKTOTAL, CKMB, CKMBINDEX, TROPONINI in the last 72 hours.  BNP: BNP (last 3 results) Recent Labs    10/07/20 0229  BNP 4,299.0*    ProBNP (last 3 results) No results for input(s): PROBNP in the last 8760 hours.   D-Dimer No results for input(s): DDIMER in the last 72 hours. Hemoglobin A1C No results for input(s): HGBA1C in the last 72 hours. Fasting Lipid Panel No results for input(s): CHOL, HDL, LDLCALC, TRIG, CHOLHDL, LDLDIRECT in the last 72 hours. Thyroid Function Tests No results for input(s): TSH, T4TOTAL, T3FREE, THYROIDAB in the last 72 hours.  Invalid input(s): FREET3  Other results:   Imaging    No results found.   Medications:     Scheduled Medications: . atorvastatin  20  mg Oral Daily  . Chlorhexidine Gluconate Cloth  6 each Topical Daily  . furosemide  80 mg Intravenous BID  . insulin aspart  0-6 Units Subcutaneous Q4H  . pantoprazole  40 mg Oral Daily  . sodium chloride flush  3 mL Intravenous Q12H    Infusions: . sodium chloride    . amiodarone 60 mg/hr (10/11/20 1020)  . heparin 1,400 Units/hr (10/11/20 0041)  . milrinone 0.25 mcg/kg/min (10/11/20 1245)    PRN Medications: sodium chloride, acetaminophen, albuterol, dextrose, ondansetron (ZOFRAN) IV, sodium chloride flush, sodium chloride flush     Assessment/Plan     1. A Flutter RVR - Started on amiodarone drip. Remains tachycardic with rates > 110-130 - Failed attempt at DCCV 3/18 - Continue IV amiodarone load, repeat DCCV next week - TSH normal - Continue IV heparin   2. 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  - Echo this admit w/ severe biventricular HF with EF < 15%.  BNP > 4000 . Suspect tachy-mediated. HS Trop - no trend. No chest pain. No LHC for now. Could consider ischemic work up later if renal function improves. Susepct he may need RHC if not responding to diuresis. - PICC placed initial Co-ox 40%. Started Milrinone 0.25 - Co-ox 57% today  - Remains markedly volume overloaded - not responding well to lasix 80 IV bid. Received metolazone 2.5 without much effect.  - Wil switch to lasix gtt at 20  - No bb w/ low output  - No arb/dig/spiro with AKI  - c/w unna boots.   3. AKI on CKD Stage IIIa -Creatinine on admit 2.8.  Most recent creatinine was 1.55 back in 03/2020. - Suspect this is due to cardiogenic shock.  - Has not been taking NSAIDs.  - Creatinine improving w/ diuresis 2.8>2.7>>2.5>>2.4>>2.6 - Follow renal function daily - Consider RHC  4. DMII - Hgb A1C 5.9  - On SSI  5. Lower Extremity Edema -Dopplers negative for DVT - 2/2 CHF Continue diuretics + unna boots   6. 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  -Continue PPI  7. HTN  - Pressures soft but stable - monitor w/ afib and w/ diuresis     Length of Stay: 4  Glori Bickers, MD  10/11/2020, 12:49 PM  Advanced Heart Failure Team Pager [phone] (M-F; 7a - 5p)  Please contact [facility] for night-coverage after hours (4p -7a ) and weekends on [email or web address]

## 2020-10-12 ENCOUNTER — Encounter (HOSPITAL_COMMUNITY): Payer: Self-pay | Admitting: Cardiology

## 2020-10-12 DIAGNOSIS — R197 Diarrhea, unspecified: Secondary | ICD-10-CM | POA: Diagnosis not present

## 2020-10-12 DIAGNOSIS — I4819 Other persistent atrial fibrillation: Secondary | ICD-10-CM | POA: Diagnosis not present

## 2020-10-12 DIAGNOSIS — I5021 Acute systolic (congestive) heart failure: Secondary | ICD-10-CM | POA: Diagnosis not present

## 2020-10-12 DIAGNOSIS — N179 Acute kidney failure, unspecified: Secondary | ICD-10-CM | POA: Diagnosis not present

## 2020-10-12 LAB — BASIC METABOLIC PANEL
Anion gap: 10 (ref 5–15)
BUN: 47 mg/dL — ABNORMAL HIGH (ref 8–23)
CO2: 19 mmol/L — ABNORMAL LOW (ref 22–32)
Calcium: 8.1 mg/dL — ABNORMAL LOW (ref 8.9–10.3)
Chloride: 105 mmol/L (ref 98–111)
Creatinine, Ser: 2.59 mg/dL — ABNORMAL HIGH (ref 0.61–1.24)
GFR, Estimated: 25 mL/min — ABNORMAL LOW (ref >60)
Glucose, Bld: 141 mg/dL — ABNORMAL HIGH (ref 70–99)
Potassium: 4.1 mmol/L (ref 3.5–5.1)
Sodium: 134 mmol/L — ABNORMAL LOW (ref 135–145)

## 2020-10-12 LAB — GLUCOSE, CAPILLARY
Glucose-Capillary: 111 mg/dL — ABNORMAL HIGH (ref 70–99)
Glucose-Capillary: 120 mg/dL — ABNORMAL HIGH (ref 70–99)
Glucose-Capillary: 125 mg/dL — ABNORMAL HIGH (ref 70–99)
Glucose-Capillary: 130 mg/dL — ABNORMAL HIGH (ref 70–99)
Glucose-Capillary: 130 mg/dL — ABNORMAL HIGH (ref 70–99)
Glucose-Capillary: 148 mg/dL — ABNORMAL HIGH (ref 70–99)

## 2020-10-12 LAB — CBC
HCT: 31.8 % — ABNORMAL LOW (ref 39.0–52.0)
Hemoglobin: 10.4 g/dL — ABNORMAL LOW (ref 13.0–17.0)
MCH: 28.1 pg (ref 26.0–34.0)
MCHC: 32.7 g/dL (ref 30.0–36.0)
MCV: 85.9 fL (ref 80.0–100.0)
Platelets: 131 10*3/uL — ABNORMAL LOW (ref 150–400)
RBC: 3.7 MIL/uL — ABNORMAL LOW (ref 4.22–5.81)
RDW: 19.1 % — ABNORMAL HIGH (ref 11.5–15.5)
WBC: 5.3 10*3/uL (ref 4.0–10.5)
nRBC: 0 % (ref 0.0–0.2)

## 2020-10-12 LAB — COOXEMETRY PANEL
Carboxyhemoglobin: 0.9 % (ref 0.5–1.5)
Methemoglobin: 0.8 % (ref 0.0–1.5)
O2 Saturation: 58.8 %
Total hemoglobin: 10.7 g/dL — ABNORMAL LOW (ref 12.0–16.0)

## 2020-10-12 LAB — HEPARIN LEVEL (UNFRACTIONATED): Heparin Unfractionated: 0.48 IU/mL (ref 0.30–0.70)

## 2020-10-12 MED ORDER — METOLAZONE 5 MG PO TABS
5.0000 mg | ORAL_TABLET | Freq: Once | ORAL | Status: AC
  Administered 2020-10-12: 5 mg via ORAL
  Filled 2020-10-12: qty 1

## 2020-10-12 NOTE — Progress Notes (Signed)
Advanced Heart Failure Rounding Note  PCP-Cardiologist: No primary care provider on file.    Patient Profile   80 y/o male with obesity and HTN. Admitted with acute HF in setting of AF with RVR of unknown duration.   Echo LVEF 10-15%. RV severely reduced. PICC placed with initial co-ox 40% suggesting markedly low output. CVP up  Subjective:    TEE 3/18 w/ severe Biv dysfunction, Mod-severe MR, severe TR. No LAA thrombus. DCCV unsuccessful   Remains on Amio gtt at 60/hr - received multiple amio boluses yesterday. HR 120-130s    Switched to lasix gtt at 20. Urine output improved but still not brisk. Weight up 4 pounds.   On Milrinone 0.25. Co-ox 59%  CVP 22   SCr trending down 2.69>>2.51>>2.44>>2.63 > 2.59  Objective:   Weight Range: 120.2 kg Body mass index is 42.77 kg/m.   Vital Signs:   Temp:  [97.6 F (36.4 C)-98.7 F (37.1 C)] 97.7 F (36.5 C) (03/20 0744) Pulse Rate:  [66-132] 79 (03/20 0744) Resp:  [16-23] 23 (03/20 0744) BP: (92-138)/(64-94) 138/85 (03/20 0744) SpO2:  [91 %-99 %] 99 % (03/20 0335) Weight:  [120.2 kg] 120.2 kg (03/20 0153) Last BM Date: 10/11/20  Weight change: Filed Weights   10/10/20 0115 10/11/20 0101 10/12/20 0153  Weight: 116.6 kg 118.3 kg 120.2 kg    Intake/Output:   Intake/Output Summary (Last 24 hours) at 10/12/2020 1053 Last data filed at 10/12/2020 0815 Gross per 24 hour  Intake 3140.56 ml  Output 1650 ml  Net 1490.56 ml      Physical Exam    General:  Chronically ill appearing, obese. No resp difficulty HEENT: normal Neck: supple.JVP to ear Carotids 2+ bilat; no bruits. No lymphadenopathy or thryomegaly appreciated. Cor: PMI nondisplaced. Irregular tachy  Lungs: clear Abdomen: obese soft, nontender, nondistended. No hepatosplenomegaly. No bruits or masses. Good bowel sounds. Extremities: no cyanosis, clubbing, rash, 3+ edema + UNNA Neuro: alert & orientedx3, cranial nerves grossly intact. moves all 4 extremities  w/o difficulty. Affect pleasant  Telemetry   Afib w/ RVR 120-130s  Personally reviewed  Labs    CBC Recent Labs    10/11/20 0432 10/12/20 0500  WBC 6.2 5.3  HGB 11.2* 10.4*  HCT 33.2* 31.8*  MCV 84.5 85.9  PLT 155 [id number]*   Basic Metabolic Panel Recent Labs    10/11/20 0432 10/12/20 0500  NA 136 134*  K 4.4 4.1  CL 107 105  CO2 18* 19*  GLUCOSE 151* 141*  BUN 46* 47*  CREATININE 2.63* 2.59*  CALCIUM 8.2* 8.1*   Liver Function Tests No results for input(s): AST, ALT, ALKPHOS, BILITOT, PROT, ALBUMIN in the last 72 hours. No results for input(s): LIPASE, AMYLASE in the last 72 hours. Cardiac Enzymes No results for input(s): CKTOTAL, CKMB, CKMBINDEX, TROPONINI in the last 72 hours.  BNP: BNP (last 3 results) Recent Labs    10/07/20 0229  BNP 4,299.0*    ProBNP (last 3 results) No results for input(s): PROBNP in the last 8760 hours.   D-Dimer No results for input(s): DDIMER in the last 72 hours. Hemoglobin A1C No results for input(s): HGBA1C in the last 72 hours. Fasting Lipid Panel No results for input(s): CHOL, HDL, LDLCALC, TRIG, CHOLHDL, LDLDIRECT in the last 72 hours. Thyroid Function Tests No results for input(s): TSH, T4TOTAL, T3FREE, THYROIDAB in the last 72 hours.  Invalid input(s): FREET3  Other results:   Imaging    No results found.   Medications:  Scheduled Medications: . atorvastatin  20 mg Oral Daily  . Chlorhexidine Gluconate Cloth  6 each Topical Daily  . insulin aspart  0-6 Units Subcutaneous Q4H  . pantoprazole  40 mg Oral Daily  . sodium chloride flush  3 mL Intravenous Q12H    Infusions: . sodium chloride    . amiodarone 60 mg/hr (10/12/20 0617)  . furosemide (LASIX) 200 mg in dextrose 5% 100 mL ('2mg'$ /mL) infusion 20 mg/hr (10/11/20 1729)  . heparin 1,400 Units/hr (10/11/20 0041)  . milrinone 0.25 mcg/kg/min (10/11/20 1245)    PRN Medications: sodium chloride, acetaminophen, albuterol, dextrose, ondansetron  (ZOFRAN) IV, sodium chloride flush, sodium chloride flush  Assessment/Plan   1. A Flutter RVR - Started on amiodarone drip. Remains tachycardic with rates > 110-130 - Failed attempt at DCCV 3/18 - Continue IV amiodarone load, repeat DCCV this week - TSH normal - Continue IV heparin  2. 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  - Echo this admit w/ severe biventricular HF with EF < 15%.  BNP > 4000 . Suspect tachy-mediated. HS Trop - no trend. No chest pain. No LHC for now. Could consider ischemic work up later if renal function improves. Susepct he may need RHC if not responding to diuresis. - PICC placed initial Co-ox 40%. Started Milrinone 0.25 - Co-ox 59% today  - Remains markedly volume overloaded - switched to lasix gtt at 20 on 3/19 - Urine output still modest. Increase lasix to '30mg'$ /hr. Give metolazone 5 today - No bb w/ low output  - No arb/dig/spiro with AKI  - c/w unna boots.   3. AKI on CKD Stage IIIa - Creatinine on admit 2.8.  Most recent creatinine was 1.55 back in 03/2020. - started on milrinone for low output - Creatinine improving w/ diuresis 2.8>2.7>>2.5>>2.4>>2.6 >> 2.6 - Follow renal function daily - Consider RHC  4. DMII - Hgb A1C 5.9  - On SSI  5. Lower Extremity Edema - Dopplers negative for DVT - 2/2 CHF Continue diuretics + unna boots   6. 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  -Continue PPI  7. HTN  - Pressures soft but stable - monitor w/ afib and w/ diuresis     Length of Stay: 5  Glori Bickers, MD  10/12/2020, 10:53 AM  Advanced Heart Failure Team Pager [phone] (M-F; 7a - 5p)  Please contact [facility] for night-coverage after hours (4p -7a ) and weekends on [email or web address]

## 2020-10-12 NOTE — Progress Notes (Signed)
[facility] for Xarelto>Heparin Indication: atrial fibrillation  No Known Allergies  Patient Measurements: Height: '5\' 6"'$  (167.6 cm) Weight: 120.2 kg (264 lb 15.9 oz) IBW/kg (Calculated) : 63.8 Heparin Dosing Weight: 89.3 kg  Vital Signs: Temp: 97.7 F (36.5 C) (03/20 0744) Temp Source: Oral (03/20 0744) BP: 138/85 (03/20 0744) Pulse Rate: 79 (03/20 0744)  Labs: Recent Labs    10/09/20 2300 10/09/20 2300 10/10/20 0157 10/10/20 0707 10/10/20 1420 10/11/20 0432 10/12/20 0500  HGB  --    < >  --  11.1*  --  11.2* 10.4*  HCT  --   --   --  34.3*  --  33.2* 31.8*  PLT  --   --   --  127*  --  155 131*  APTT 145*  --  199*  --  91*  --   --   HEPARINUNFRC 1.08*  --   --   --  0.66 0.52 0.48  CREATININE  --   --   --  2.44*  --  2.63* 2.59*   < > = values in this interval not displayed.    Estimated Creatinine Clearance: 29.2 mL/min (A) (by C-G formula based on SCr of 2.59 mg/dL (H)).   Assessment: 80 yo M with new Afib with RVR. No AC noted PTA. Was started on heparin then switched to Xarelto 3/16 and received 1 dose. Pharmacy asked to switch back to IV heparin. Patient now s/p unsuccessful DCCV. Plans to try again next week.  HL therapeutic at 0.48 this morning. CBC stable, no s/sx bleeding noted.  Goal of Therapy:  Heparin level 0.3-0.7 units/ml Monitor platelets by anticoagulation protocol: Yes   Plan:  Continue heparin at 1,400 units/hr Monitor daily HL, CBC, s/sx bleeding Follow procedures and plans for switching back to oral anticoagulation  Mercy Riding, PharmD PGY1 Acute Care Pharmacy Resident  Please check [email or web address] for unit-specific pharmacy phone numbers.

## 2020-10-12 NOTE — Progress Notes (Addendum)
PROGRESS NOTE    Kyle Hicks  [id number] DOB: Nov 29, 1940 DOA: 10/07/2020 PCP: Sharilyn Sites, MD    Brief Narrative:  Kyle Hicks is a 80 y.o. male with medical history significant of hypertension, diastolic CHF last EF [id number] with grade 1 diastolic dysfunction, diabetes mellitus type 2, and PUD with history of GI bleed presents after having a fall at home.  He reports that he had not been feeling well for the last 2 days.  This morning when he got up to use the restroom he reports that his left leg got weak and he fell to his side positive bowel sounds nontender the couch.  He did not hit his head or lose consciousness.  Denies any change in speech, vision, sensation.  He feels globally weak.  Noted associated symptoms of diarrhea, possibly some weight loss, leg swelling, shortness of breath, intermittent wheezing, and palpitations.  Patient has not noticed any blood or dark stools, but he had been getting up to have a stool every 25-30 minutes recently.  Denies having any significant cough, calf/leg pain, or orthopnea symptoms.  Wife notes that he had been very weak yesterday and stayed in the bathroom all day.  He had not been eating well and had been drinking Ensure recently.  3/17-monitor with a flutter RVR in the 130s 3/18-TEE cardioversion today was unsuccessful 3/19- monitor with afib rvr 125.  Overnight patient's heart rate remained in 140s and was given amiodarone iv load 3/20-monitor afib rvr 110-120  Consultants:   cardiology  Procedures:   Antimicrobials:       Subjective: Denies dizziness, shortness of breath, chest pain.  States he actually feels well  Objective: Vitals:   10/12/20 0000 10/12/20 0059 10/12/20 0153 10/12/20 0335  BP:  119/66  117/80  Pulse:  68  67  Resp: '16 16  20  '$ Temp:  98.7 F (37.1 C)  98.2 F (36.8 C)  TempSrc:  Oral  Oral  SpO2:  97%  99%  Weight:   120.2 kg   Height:        Intake/Output Summary (Last 24 hours) at 10/12/2020  0725 Last data filed at 10/12/2020 0600 Gross per 24 hour  Intake 2900.56 ml  Output 2250 ml  Net 650.56 ml   Filed Weights   10/10/20 0115 10/11/20 0101 10/12/20 0153  Weight: 116.6 kg 118.3 kg 120.2 kg    Examination: Calm, NAD Decreased breath sounds at bases, no wheezing Irregular, tachy, S1-S2 no gallops Soft benign +bs +edema Awake and alert Mood and affect appropriate in current setting     Data Reviewed: I have personally reviewed following labs and imaging studies  CBC: Recent Labs  Lab 10/07/20 0229 10/08/20 0119 10/09/20 0404 10/10/20 0707 10/11/20 0432 10/12/20 0500  WBC 7.5 6.2 6.1 5.5 6.2 5.3  NEUTROABS 5.9  --   --   --   --   --   HGB 13.5 12.2* 12.3* 11.1* 11.2* 10.4*  HCT 43.9 36.4* 37.4* 34.3* 33.2* 31.8*  MCV 91.1 87.9 86.0 86.8 84.5 85.9  PLT 165 151 105* 127* 155 [id number]*   Basic Metabolic Panel: Recent Labs  Lab 10/07/20 0229 10/07/20 2011 10/08/20 0119 10/09/20 0404 10/10/20 0707 10/11/20 0432 10/12/20 0500  NA 139 135 137 137 134* 136 134*  K 5.1 5.1 5.1 4.5 3.6 4.4 4.1  CL 113* 112* 116* 113* 106 107 105  CO2 13* 12* 12* 13* 17* 18* 19*  GLUCOSE 73 140* 117* 105* 228* 151* 141*  BUN 47* 49* 51* 48* 45* 46* 47*  CREATININE 2.78* 2.63* 2.69* 2.51* 2.44* 2.63* 2.59*  CALCIUM 8.7* 8.0* 8.2* 8.1* 7.8* 8.2* 8.1*  MG 1.9 1.7  --   --   --   --   --    GFR: Estimated Creatinine Clearance: 29.2 mL/min (A) (by C-G formula based on SCr of 2.59 mg/dL (H)). Liver Function Tests: Recent Labs  Lab 10/07/20 0229  AST 29  ALT 26  ALKPHOS 49  BILITOT 1.4*  PROT 7.0  ALBUMIN 3.4*   No results for input(s): LIPASE, AMYLASE in the last 168 hours. No results for input(s): AMMONIA in the last 168 hours. Coagulation Profile: No results for input(s): INR, PROTIME in the last 168 hours. Cardiac Enzymes: No results for input(s): CKTOTAL, CKMB, CKMBINDEX, TROPONINI in the last 168 hours. BNP (last 3 results) No results for input(s): PROBNP in  the last 8760 hours. HbA1C: No results for input(s): HGBA1C in the last 72 hours. CBG: Recent Labs  Lab 10/11/20 1142 10/11/20 1623 10/11/20 1956 10/11/20 2334 10/12/20 0333  GLUCAP 131* 128* 139* 125* 111*   Lipid Profile: No results for input(s): CHOL, HDL, LDLCALC, TRIG, CHOLHDL, LDLDIRECT in the last 72 hours. Thyroid Function Tests: No results for input(s): TSH, T4TOTAL, FREET4, T3FREE, THYROIDAB in the last 72 hours. Anemia Panel: No results for input(s): VITAMINB12, FOLATE, FERRITIN, TIBC, IRON, RETICCTPCT in the last 72 hours. Sepsis Labs: Recent Labs  Lab 10/07/20 0229 10/07/20 0429  LATICACIDVEN 2.0* 1.8    Recent Results (from the past 240 hour(s))  Resp Panel by RT-PCR (Flu A&B, Covid) Nasopharyngeal Swab     Status: None   Collection Time: 10/07/20  3:07 AM   Specimen: Nasopharyngeal Swab; Nasopharyngeal(NP) swabs in vial transport medium  Result Value Ref Range Status   SARS Coronavirus 2 by RT PCR NEGATIVE NEGATIVE Final    Comment: (NOTE) SARS-CoV-2 target nucleic acids are NOT DETECTED.  The SARS-CoV-2 RNA is generally detectable in upper respiratory specimens during the acute phase of infection. The lowest concentration of SARS-CoV-2 viral copies this assay can detect is 138 copies/mL. A negative result does not preclude SARS-Cov-2 infection and should not be used as the sole basis for treatment or other patient management decisions. A negative result may occur with  improper specimen collection/handling, submission of specimen other than nasopharyngeal swab, presence of viral mutation(s) within the areas targeted by this assay, and inadequate number of viral copies(<138 copies/mL). A negative result must be combined with clinical observations, patient history, and epidemiological information. The expected result is Negative.  Fact Sheet for Patients:  EntrepreneurPulse.com.au  Fact Sheet for Healthcare Providers:   IncredibleEmployment.be  This test is no t yet approved or cleared by the [place] FDA and  has been authorized for detection and/or diagnosis of SARS-CoV-2 by FDA under an Emergency Use Authorization (EUA). This EUA will remain  in effect (meaning this test can be used) for the duration of the COVID-19 declaration under Section 564(b)(1) of the Act, 21 U.S.C.section 360bbb-3(b)(1), unless the authorization is terminated  or revoked sooner.       Influenza A by PCR NEGATIVE NEGATIVE Final   Influenza B by PCR NEGATIVE NEGATIVE Final    Comment: (NOTE) The Xpert Xpress SARS-CoV-2/FLU/RSV plus assay is intended as an aid in the diagnosis of influenza from Nasopharyngeal swab specimens and should not be used as a sole basis for treatment. Nasal washings and aspirates are unacceptable for Xpert Xpress SARS-CoV-2/FLU/RSV testing.  Fact Sheet  for Patients: EntrepreneurPulse.com.au  Fact Sheet for Healthcare Providers: IncredibleEmployment.be  This test is not yet approved or cleared by the [place] FDA and has been authorized for detection and/or diagnosis of SARS-CoV-2 by FDA under an Emergency Use Authorization (EUA). This EUA will remain in effect (meaning this test can be used) for the duration of the COVID-19 declaration under Section 564(b)(1) of the Act, 21 U.S.C. section 360bbb-3(b)(1), unless the authorization is terminated or revoked.  Performed at [facility], [place] [address], [place], [place] [address]          Radiology Studies: ECHO TEE  Result Date: 10/10/2020    TRANSESOPHOGEAL ECHO REPORT   Patient Name:   Kyle Hicks [place] Date of Exam: 10/10/2020 Medical Rec #:  [id number]      Height:       66.0 in Accession #:    [id number]     Weight:       257.1 lb Date of Birth:  [birth year]      BSA:          2.225 m Patient Age:    82 years       BP:           126/75 mmHg Patient Gender: M               HR:           127 bpm. Exam Location:  Inpatient Procedure: 2D Echo, Cardiac Doppler and Color Doppler Indications:     R94.31 Abnormal EKG; I48.91* Unspeicified atrial fibrillation  History:         Patient has prior history of Echocardiogram examinations, most                  recent 10/08/2020. CHF; Risk Factors:Hypertension, Diabetes and                  Dyslipidemia.  Sonographer:     Roseanna Rainbow RDCS Referring Phys:  [place] Diagnosing Phys: Kirk Ruths MD PROCEDURE: After discussion of the risks and benefits of a TEE, an informed consent was obtained from the patient. The transesophogeal probe was passed without difficulty through the esophogus of the patient. Imaged were obtained with the patient in a left lateral decubitus position. Sedation performed by different physician. The patient was monitored while under deep sedation. The patient developed no complications during the procedure. An unsuccessful direct current cardioversion was performed at 200 joules with 3 attempts. Ketamine '50mg'$ . IMPRESSIONS  1. Left ventricular ejection fraction, by estimation, is <20%. The left ventricle has severely decreased function. The left ventricle demonstrates global hypokinesis. The left ventricular internal cavity size was mildly dilated.  2. Right ventricular systolic function is severely reduced. The right ventricular size is severely enlarged.  3. Left atrial size was moderately dilated. No left atrial/left atrial appendage thrombus was detected.  4. Right atrial size was moderately dilated.  5. The mitral valve is abnormal. 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  6. Tricuspid valve regurgitation is severe.  7. The aortic valve is tricuspid. Aortic valve regurgitation is mild. Mild aortic valve sclerosis is present, with no evidence of aortic valve stenosis.  8. Aortic dilatation noted. There is mild dilatation of the aortic root, measuring 40 mm. FINDINGS  Left Ventricle: Left ventricular  ejection fraction, by estimation, is <20%. The left ventricle has severely decreased function. The left ventricle demonstrates global hypokinesis. The left ventricular internal cavity size was mildly dilated. Right Ventricle:  The right ventricular size is severely enlarged. Right vetricular wall thickness was not assessed. Right ventricular systolic function is severely reduced. Left Atrium: Left atrial size was moderately dilated. Spontaneous echo contrast was present in the left atrium and left atrial appendage. No left atrial/left atrial appendage thrombus was detected. Right Atrium: Right atrial size was moderately dilated. Pericardium: There is no evidence of pericardial effusion. Mitral Valve: The mitral valve is abnormal. 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 Tricuspid Valve: The tricuspid valve is normal in structure. Tricuspid valve regurgitation is severe. Aortic Valve: The aortic valve is tricuspid. Aortic valve regurgitation is mild. Mild aortic valve sclerosis is present, with no evidence of aortic valve stenosis. Pulmonic Valve: The pulmonic valve was grossly normal. Pulmonic valve regurgitation is trivial. Aorta: Aortic dilatation noted. There is mild dilatation of the aortic root, measuring 40 mm. IAS/Shunts: No atrial level shunt detected by color flow Doppler. Kirk Ruths MD Electronically signed by Kirk Ruths MD Signature Date/Time: 10/10/2020/10:31:47 AM    Final         Scheduled Meds:  atorvastatin  20 mg Oral Daily   Chlorhexidine Gluconate Cloth  6 each Topical Daily   insulin aspart  0-6 Units Subcutaneous Q4H   pantoprazole  40 mg Oral Daily   sodium chloride flush  3 mL Intravenous Q12H   Continuous Infusions:  sodium chloride     amiodarone 60 mg/hr (10/12/20 0617)   furosemide (LASIX) 200 mg in dextrose 5% 100 mL ('2mg'$ /mL) infusion 20 mg/hr (10/11/20 1729)   heparin 1,400 Units/hr (10/11/20 0041)   milrinone 0.25 mcg/kg/min (10/11/20 1245)     Assessment & Plan:   Principal Problem:   Acute CHF (congestive heart failure) (HCC) Active Problems:   Acute kidney injury superimposed on chronic kidney disease ([place])   Essential hypertension   Elevated d-dimer   Obesity (BMI 30-39.9)   Diarrhea   Atrial flutter (HCC)   Atrial fibrillation (HCC)   Acute biventricular H failure Echo with EF now 10-15% RV severely reduced  BNP elevated Cardiology following picc placed for cvp monitoring-on 3/17 3/18-TEE today with severe biv dysfunction, mod-se MR, sev TR, no LAA thrombus 3/20 still vol. Overloaded. With tachycardia it will be hard to make him euvolemic. Need HR control Now on lasix gtt instead of iv No beta blk No acei/arb/dig/aldactone due to AKI Unna boots.     Elevated D-dimer: Acute.  On admission D-dimer elevated at 4.72.  Unable to obtain a CT angiogram of the chest with contrast due to kidney function. Possibly may need VQ scan once able to Doppler negative for DVT Continue heparin drip per pharmacy    Acute kidney injury superimposed on chronic kidney disease stage IIIb: Patient presents with creatinine elevated up to 2.78 with BUN 47.  Baseline creatinine previously have been around 1.5.   Suspect hypoperfusion due to tachycardia and heart failure exacerbation./Cardiorenal syndrome 3/20 -renal function stable, creatinine 2.59 today  Hold nephrotoxic meds  Continue diuresis as above     Aflutter rvr Rate still not controlled, remains tachycardic  Failed DCCV  Continue IV amiodarone, plan to repeat DCCV  next week  TSH normal  Continue heparin drip        Diabetes mellitus type 2, hypoglycemia: Acute.  On admission glucose 63.  Last hemoglobin A1c 6.5 in 03/2020.  Home medications include glipizide 5 mg daily, and Janumet 25- 500 mg tablet twice daily. 3/20-BG controlled Continue RISS     Fall and generalized weakness: Patient presents after  having a fall at home.  He reportedly has  been having diarrhea all day yesterday which may be a contributing factor to his weakness. PT      Diarrhea: Acute.  Patient has not recently been on antibiotics, but reportedly has intermittent episodes of diarrhea. -Monitor intake and output -Consider checking C. difficile if diarrhea persist -none reported  Essential hypertension: On admission blood pressures were initially noted to be soft as 91/63. -Discontinued nifedipine per cardiology Hyperlipidemia -Continue atorvastatin 20 mg nightly  Prolonged QT interval: QTC 516  -Avoid QT prolonging medications  Obesity   DVT prophylaxis: heparin Code Status:FULL Family Communication: none at bedside  Status is: Inpatient  Remains inpatient appropriate because:Inpatient level of care appropriate due to severity of illness   Dispo: The patient is from: Home              Anticipated d/c is to: Home              Patient currently is not medically stable to d/c.   Difficult to place patient No  Patient still requires IV medications and is not medically stable.          LOS: 5 days   Time spent: 35 min with >50% on coc   Nolberto Hanlon, MD [facility] Pager [phone]  If 7PM-7AM, please contact night-coverage 10/12/2020, 7:25 AM

## 2020-10-13 ENCOUNTER — Ambulatory Visit: Payer: Medicare HMO | Admitting: "Endocrinology

## 2020-10-13 DIAGNOSIS — R197 Diarrhea, unspecified: Secondary | ICD-10-CM | POA: Diagnosis not present

## 2020-10-13 DIAGNOSIS — I4819 Other persistent atrial fibrillation: Secondary | ICD-10-CM | POA: Diagnosis not present

## 2020-10-13 DIAGNOSIS — I5021 Acute systolic (congestive) heart failure: Secondary | ICD-10-CM | POA: Diagnosis not present

## 2020-10-13 DIAGNOSIS — N179 Acute kidney failure, unspecified: Secondary | ICD-10-CM | POA: Diagnosis not present

## 2020-10-13 DIAGNOSIS — I5023 Acute on chronic systolic (congestive) heart failure: Secondary | ICD-10-CM | POA: Diagnosis not present

## 2020-10-13 LAB — COOXEMETRY PANEL
Carboxyhemoglobin: 0.9 % (ref 0.5–1.5)
Methemoglobin: 0.6 % (ref 0.0–1.5)
O2 Saturation: 63.6 %
Total hemoglobin: 10.6 g/dL — ABNORMAL LOW (ref 12.0–16.0)

## 2020-10-13 LAB — CBC
HCT: 30.2 % — ABNORMAL LOW (ref 39.0–52.0)
Hemoglobin: 10.1 g/dL — ABNORMAL LOW (ref 13.0–17.0)
MCH: 28.3 pg (ref 26.0–34.0)
MCHC: 33.4 g/dL (ref 30.0–36.0)
MCV: 84.6 fL (ref 80.0–100.0)
Platelets: 126 10*3/uL — ABNORMAL LOW (ref 150–400)
RBC: 3.57 MIL/uL — ABNORMAL LOW (ref 4.22–5.81)
RDW: 18.8 % — ABNORMAL HIGH (ref 11.5–15.5)
WBC: 5.3 10*3/uL (ref 4.0–10.5)
nRBC: 0 % (ref 0.0–0.2)

## 2020-10-13 LAB — BASIC METABOLIC PANEL
Anion gap: 9 (ref 5–15)
BUN: 48 mg/dL — ABNORMAL HIGH (ref 8–23)
CO2: 20 mmol/L — ABNORMAL LOW (ref 22–32)
Calcium: 8.1 mg/dL — ABNORMAL LOW (ref 8.9–10.3)
Chloride: 105 mmol/L (ref 98–111)
Creatinine, Ser: 2.82 mg/dL — ABNORMAL HIGH (ref 0.61–1.24)
GFR, Estimated: 22 mL/min — ABNORMAL LOW (ref >60)
Glucose, Bld: 124 mg/dL — ABNORMAL HIGH (ref 70–99)
Potassium: 3.8 mmol/L (ref 3.5–5.1)
Sodium: 134 mmol/L — ABNORMAL LOW (ref 135–145)

## 2020-10-13 LAB — HEPARIN LEVEL (UNFRACTIONATED)
Heparin Unfractionated: 0.24 IU/mL — ABNORMAL LOW (ref 0.30–0.70)
Heparin Unfractionated: 0.32 IU/mL (ref 0.30–0.70)

## 2020-10-13 LAB — GLUCOSE, CAPILLARY
Glucose-Capillary: 117 mg/dL — ABNORMAL HIGH (ref 70–99)
Glucose-Capillary: 103 mg/dL — ABNORMAL HIGH (ref 70–99)
Glucose-Capillary: 144 mg/dL — ABNORMAL HIGH (ref 70–99)
Glucose-Capillary: 131 mg/dL — ABNORMAL HIGH (ref 70–99)
Glucose-Capillary: 145 mg/dL — ABNORMAL HIGH (ref 70–99)

## 2020-10-13 LAB — SODIUM, URINE, RANDOM: Sodium, Ur: 116 mmol/L

## 2020-10-13 MED ORDER — POTASSIUM CHLORIDE CRYS ER 20 MEQ PO TBCR
20.0000 meq | EXTENDED_RELEASE_TABLET | Freq: Once | ORAL | Status: AC
  Administered 2020-10-13: 20 meq via ORAL
  Filled 2020-10-13: qty 1

## 2020-10-13 NOTE — Progress Notes (Signed)
[place] for Xarelto>Heparin Indication: atrial fibrillation  No Known Allergies  Patient Measurements: Height: '5\' 6"'$  (167.6 cm) Weight: 119.6 kg (263 lb 10.7 oz) IBW/kg (Calculated) : 63.8 Heparin Dosing Weight: 89.3 kg  Vital Signs: Temp: 98.3 F (36.8 C) (03/21 1100) Temp Source: Oral (03/21 1100) BP: 119/67 (03/21 0354) Pulse Rate: 64 (03/21 0354)  Labs: Recent Labs    10/10/20 1420 10/10/20 1420 10/11/20 0432 10/12/20 0500 10/13/20 0500 10/13/20 1000  HGB  --    < > 11.2* 10.4* 10.1*  --   HCT  --   --  33.2* 31.8* 30.2*  --   PLT  --   --  155 131* 126*  --   APTT 91*  --   --   --   --   --   HEPARINUNFRC 0.66  --  0.52 0.48 0.24* 0.32  CREATININE  --   --  2.63* 2.59* 2.82*  --    < > = values in this interval not displayed.    Estimated Creatinine Clearance: 26.7 mL/min (A) (by C-G formula based on SCr of 2.82 mg/dL (H)).   Assessment: 80 yo M with new Afib with RVR. No AC noted PTA. Was started on heparin then switched to Xarelto 3/16 and received 1 dose. Pharmacy asked to switch back to IV heparin. Patient now s/p unsuccessful DCCV. Plans to try again this week once diuresed.  Heparin level now therapeutic at 0.32, on drip rate 1400 units/hr. CBC low stable, Hgb 10s, pltc 126. No overt bleeding or infusion issues noted.   Goal of Therapy:  Heparin level 0.3-0.7 units/ml Monitor platelets by anticoagulation protocol: Yes   Plan:  Continue heparin at 1400 units/hr Monitor daily HL, CBC, s/sx bleeding  Richardine Service, PharmD, BCPS PGY2 Cardiology Pharmacy Resident Phone: [phone] 10/13/2020  12:01 PM  Please check [email or web address] for unit-specific pharmacy phone numbers.

## 2020-10-13 NOTE — Care Management Important Message (Signed)
Important Message  Patient Details  Name: VINT MASLEN MRN: [id number] Date of Birth: Aug 16, 1940   Medicare Important Message Given:  Yes     Shelda Altes 10/13/2020, 10:37 AM

## 2020-10-13 NOTE — Progress Notes (Signed)
[place] for Xarelto>Heparin Indication: atrial fibrillation  No Known Allergies  Patient Measurements: Height: '5\' 6"'$  (167.6 cm) Weight: 119.6 kg (263 lb 10.7 oz) IBW/kg (Calculated) : 63.8 Heparin Dosing Weight: 89.3 kg  Vital Signs: Temp: 97.9 F (36.6 C) (03/21 0354) Temp Source: Oral (03/21 0354) BP: 119/67 (03/21 0354) Pulse Rate: 64 (03/21 0354)  Labs: Recent Labs    10/10/20 1420 10/11/20 0432 10/12/20 0500 10/13/20 0500  HGB  --  11.2* 10.4* 10.1*  HCT  --  33.2* 31.8* 30.2*  PLT  --  155 131* 126*  APTT 91*  --   --   --   HEPARINUNFRC 0.66 0.52 0.48 0.24*  CREATININE  --  2.63* 2.59* 2.82*    Estimated Creatinine Clearance: 26.7 mL/min (A) (by C-G formula based on SCr of 2.82 mg/dL (H)).   Assessment: 80 yo M with new Afib with RVR. No AC noted PTA. Was started on heparin then switched to Xarelto 3/16 and received 1 dose. Pharmacy asked to switch back to IV heparin. Patient now s/p unsuccessful DCCV. Plans to try again next week.  Heparin level down to subtherapeutic (0.24) this morning on gtt at 1400 units/hr. RN reports that heparin was off for a couple of hours last night, thinks he restarted ~2300. Pt has been stable on this dose for a few days now.  Goal of Therapy:  Heparin level 0.3-0.7 units/ml Monitor platelets by anticoagulation protocol: Yes   Plan:  Continue heparin at 1400 units/hr Will recheck heparin level at [place], PharmD, BCPS Please see amion for complete clinical pharmacist phone list 10/13/2020 6:28 AM

## 2020-10-13 NOTE — Plan of Care (Signed)
  Problem: Education: Goal: Ability to demonstrate management of disease process will improve Outcome: Progressing Goal: Ability to verbalize understanding of medication therapies will improve Outcome: Progressing Goal: Individualized Educational Video(s) Outcome: Progressing   Problem: Activity: Goal: Capacity to carry out activities will improve Outcome: Progressing Note: Up yo chair today   Problem: Cardiac: Goal: Ability to achieve and maintain adequate cardiopulmonary perfusion will improve Outcome: Progressing   Problem: Education: Goal: Knowledge of General Education information will improve Description: Including pain rating scale, medication(s)/side effects and non-pharmacologic comfort measures Outcome: Progressing   Problem: Health Behavior/Discharge Planning: Goal: Ability to manage health-related needs will improve Outcome: Progressing   Problem: Clinical Measurements: Goal: Ability to maintain clinical measurements within normal limits will improve Outcome: Progressing Goal: Will remain free from infection Outcome: Progressing Goal: Diagnostic test results will improve Outcome: Progressing Goal: Respiratory complications will improve Outcome: Progressing Goal: Cardiovascular complication will be avoided Outcome: Progressing   Problem: Activity: Goal: Risk for activity intolerance will decrease Outcome: Progressing   Problem: Nutrition: Goal: Adequate nutrition will be maintained Outcome: Progressing   Problem: Elimination: Goal: Will not experience complications related to bowel motility Outcome: Progressing Goal: Will not experience complications related to urinary retention Outcome: Progressing   Problem: Safety: Goal: Ability to remain free from injury will improve Outcome: Progressing   Problem: Skin Integrity: Goal: Risk for impaired skin integrity will decrease Outcome: Progressing

## 2020-10-13 NOTE — Progress Notes (Signed)
°   10/13/20 1939  Assess: MEWS Score  Temp 98.3 F (36.8 C)  BP 131/75  ECG Heart Rate (!) 116  Resp (!) 23  Level of Consciousness Alert  SpO2 98 %  O2 Device Room Air  Assess: MEWS Score  MEWS Temp 0  MEWS Systolic 0  MEWS Pulse 2  MEWS RR 1  MEWS LOC 0  MEWS Score 3  MEWS Score Color Yellow  Assess: if the MEWS score is Yellow or Red  Were vital signs taken at a resting state? Yes  Focused Assessment No change from prior assessment  Early Detection of Sepsis Score *See Row Information* Low  MEWS guidelines implemented *See Row Information* No, previously yellow, continue vital signs every 4 hours  Treat  MEWS Interventions Administered scheduled meds/treatments  Pain Scale 0-10  Pain Score 0  Notify: Charge Nurse/RN  Name of Charge Nurse/RN Notified [place] RN  Date Charge Nurse/RN Notified 10/13/20  Time Charge Nurse/RN Notified 1950  Document  Patient Outcome Other (Comment) (Remains stable and remains on unit)  Progress note created (see row info) Yes

## 2020-10-13 NOTE — NC FL2 (Signed)
[place] LEVEL OF CARE SCREENING TOOL     IDENTIFICATION  Patient Name: Kyle Hicks: 1941/03/28 Sex: male Admission Date (Current Location): 10/07/2020  [facility] and [place] Number:  Herbalist and Address:  [facility], [place] [address], [place], [place] [address]      Provider Number: [id number]  Attending Physician Name and Address:  Nolberto Hanlon, MD  Relative Name and Phone Number:  Oettinger,Clarinda (Spouse)   [phone]    Current Level of Care: SNF Recommended Level of Care: [place] Prior Approval Number:    Date Approved/Denied:   PASRR Number: [id number]  Discharge Plan: SNF    Current Diagnoses: Patient Active Problem List   Diagnosis Date Noted  . Atrial flutter ([place])   . Atrial fibrillation ([place])   . Acute CHF (congestive heart failure) ([place]) 10/07/2020  . Elevated d-dimer 10/07/2020  . Obesity (BMI 30-39.9) 10/07/2020  . Diarrhea 10/07/2020  . Portal hypertensive gastropathy ([place]) 06/10/2017  . Anemia   . UGI bleed   . Duodenal ulcer   . Mucosal abnormality of stomach   . Rectal bleed 04/27/2017  . Uncontrolled type 2 diabetes mellitus with stage 2 chronic kidney disease, without long-term current use of insulin ([place]) 04/25/2017  . Melena 04/25/2017  . Hypomagnesemia 04/25/2017  . Chronic renal insufficiency, stage 2 (mild)   . GIB (gastrointestinal bleeding) 04/24/2017  . Elevated troponin 04/24/2017  . Weakness generalized 04/24/2017  . Heme positive stool   . Mixed hyperlipidemia 06/24/2015  . Encephalopathy acute 02/23/2013  . Acute kidney injury superimposed on chronic kidney disease ([place]) 02/23/2013  . Morbid obesity ([place]) 02/23/2013  . Uncontrolled type 2 diabetes mellitus with stage 2 chronic kidney disease ([place])   . Essential hypertension     Orientation RESPIRATION BLADDER Height & Weight     Self,Time,Situation,Place  Normal External catheter Weight: 263 lb  10.7 oz (119.6 kg) Height:  '5\' 6"'$  (167.6 cm)  BEHAVIORAL SYMPTOMS/MOOD NEUROLOGICAL BOWEL NUTRITION STATUS      Continent Diet  AMBULATORY STATUS COMMUNICATION OF NEEDS Skin   Limited Assist Verbally Normal                       Personal Care Assistance Level of Assistance  Bathing,Feeding,Dressing Bathing Assistance: Limited assistance Feeding assistance: Independent Dressing Assistance: Limited assistance     Functional Limitations Info  Sight,Hearing,Speech Sight Info: Adequate Hearing Info: Impaired Speech Info: Adequate    SPECIAL CARE FACTORS FREQUENCY  PT (By licensed PT),OT (By licensed OT)     PT Frequency: 5x a week OT Frequency: 5x a week            Contractures Contractures Info: Not present    Additional Factors Info  Code Status,Allergies Code Status Info: Full Allergies Info: NKA           Current Medications (10/13/2020):  This is the current hospital active medication list Current Facility-Administered Medications  Medication Dose Route Frequency Provider Last Rate Last Admin  . 0.9 %  sodium chloride infusion  250 mL Intravenous PRN Lelon Perla, MD      . acetaminophen (TYLENOL) tablet 650 mg  650 mg Oral Q4H PRN Lelon Perla, MD      . albuterol (PROVENTIL) (2.5 MG/3ML) 0.083% nebulizer solution 2.5 mg  2.5 mg Nebulization Q6H PRN Lelon Perla, MD      . amiodarone (NEXTERONE PREMIX) 360-4.14 MG/200ML-% (1.8 mg/mL) IV infusion  60 mg/hr Intravenous Continuous Lelon Perla, MD 33.3 mL/hr at 10/13/20 1226 60 mg/hr at 10/13/20 1226  . atorvastatin (LIPITOR) tablet 20 mg  20 mg Oral Daily Lelon Perla, MD   20 mg at 10/13/20 1031  . Chlorhexidine Gluconate Cloth 2 % PADS 6 each  6 each Topical Daily Lelon Perla, MD   6 each at 10/13/20 1242  . dextrose 50 % solution 50 mL  1 ampule Intravenous PRN Lelon Perla, MD      . furosemide (LASIX) 200 mg in dextrose 5 % 100 mL (2 mg/mL) infusion  30 mg/hr Intravenous  Continuous Bensimhon, Shaune Pascal, MD 15 mL/hr at 10/13/20 1223 30 mg/hr at 10/13/20 1223  . heparin ADULT infusion 100 units/mL (25000 units/256m)  1,400 Units/hr Intravenous Continuous CLelon Perla MD 14 mL/hr at 10/11/20 0041 1,400 Units/hr at 10/11/20 0041  . insulin aspart (novoLOG) injection 0-6 Units  0-6 Units Subcutaneous Q4H CLelon Perla MD   0 Units at 10/13/20 1241  . milrinone (PRIMACOR) 20 MG/100 ML (0.2 mg/mL) infusion  0.25 mcg/kg/min Intravenous Continuous CLelon Perla MD 8.82 mL/hr at 10/13/20 1226 0.25 mcg/kg/min at 10/13/20 1226  . ondansetron (ZOFRAN) injection 4 mg  4 mg Intravenous Q6H PRN CLelon Perla MD      . pantoprazole (PROTONIX) EC tablet 40 mg  40 mg Oral Daily CLelon Perla MD   40 mg at 10/13/20 1031  . sodium chloride flush (NS) 0.9 % injection 10-40 mL  10-40 mL Intracatheter PRN CLelon Perla MD      . sodium chloride flush (NS) 0.9 % injection 3 mL  3 mL Intravenous Q12H CLelon Perla MD   3 mL at 10/13/20 1242  . sodium chloride flush (NS) 0.9 % injection 3 mL  3 mL Intravenous PRN CStanford BreedBDenice Bors MD         Discharge Medications: Please see discharge summary for a list of discharge medications.  Relevant Imaging Results:  Relevant Lab Results:   Additional Information SSN# [id number] [place]ece [place]

## 2020-10-13 NOTE — Progress Notes (Signed)
   10/13/20 1200  Vitals  Temp 98.3 F (36.8 C)  Temp Source Oral  BP 125/80  MAP (mmHg) 95  BP Location Left Wrist  BP Method Automatic  Patient Position (if appropriate) Lying  Pulse Rate Source Monitor  ECG Heart Rate (!) 137  Resp 20  Level of Consciousness  Level of Consciousness Alert  Oxygen Therapy  SpO2 98 %  O2 Device Room Air  Pain Assessment  Pain Scale 0-10  Pain Score 0  Glasgow Coma Scale  Eye Opening 4  Best Verbal Response (NON-intubated) 5  Best Motor Response 6  Glasgow Coma Scale Score 15  Rapid Response Notification  Name of Rapid Response RN Notified n/a  Note  Observations Alert in chair  Chronic High Heart MD aware See Medications flow sheet

## 2020-10-13 NOTE — Progress Notes (Addendum)
Advanced Heart Failure Rounding Note  PCP-Cardiologist: No primary care provider on file.    Patient Profile   80 y/o male with obesity and HTN. Admitted with acute HF in setting of AF with RVR of unknown duration.   Echo LVEF 10-15%. RV severely reduced. PICC placed with initial co-ox 40% suggesting markedly low output. CVP up  Subjective:    TEE 3/18 w/ severe Biv dysfunction, Mod-severe MR, severe TR. No LAA thrombus. DCCV unsuccessful   Remains on Amio gtt at 60/hr. Still in Afib w/ RVR 130s . BP ok.   On Milrinone 0.25. Co-ox 64%  CVP 22-24.   Lasix gtt increased to 30/hr yesterday + 5 metolazone. Only 1.7L in UOP yesterday but UOP appears to be picking up this morning. Urine clear.    SCr bumped overnight 2.69>>2.51>>2.44>>2.63 > 2.59>2.82   Objective:   Weight Range: 119.6 kg Body mass index is 42.56 kg/m.   Vital Signs:   Temp:  [97.8 F (36.6 C)-98.4 F (36.9 C)] 97.8 F (36.6 C) (03/21 0735) Pulse Rate:  [64-101] 64 (03/21 0354) Resp:  [16-24] 16 (03/21 0354) BP: (106-133)/(64-87) 119/67 (03/21 0354) SpO2:  [95 %-100 %] 98 % (03/21 0354) Weight:  [119.6 kg] 119.6 kg (03/21 0150) Last BM Date: 10/12/20  Weight change: Filed Weights   10/11/20 0101 10/12/20 0153 10/13/20 0150  Weight: 118.3 kg 120.2 kg 119.6 kg    Intake/Output:   Intake/Output Summary (Last 24 hours) at 10/13/2020 0851 Last data filed at 10/13/2020 0845 Gross per 24 hour  Intake 1681.95 ml  Output 2550 ml  Net -868.05 ml      Physical Exam    CVP 23  General: Morbidly obese, chronically ill appearing elderly male. No resp difficulty HEENT:dentition in poor repair, otherwise normal   Neck: supple. JVP to ear. Carotids 2+ bilat; no bruits. No lymphadenopathy or thryomegaly appreciated. Cor: PMI nondisplaced. Irregularly irregular rhythm, tachy rate  Lungs: decreased BS at the bases, no wheezing Abdomen: obese soft, nontender, nondistended. No hepatosplenomegaly. No bruits  or masses. Good bowel sounds. Extremities: no cyanosis, clubbing, rash, 2+ edema, bilateral unna boots Neuro: alert & orientedx3, cranial nerves grossly intact. moves all 4 extremities w/o difficulty. Affect pleasant  Telemetry   Afib w/ RVR 120-130s  Personally reviewed  Labs    CBC Recent Labs    10/12/20 0500 10/13/20 0500  WBC 5.3 5.3  HGB 10.4* 10.1*  HCT 31.8* 30.2*  MCV 85.9 84.6  PLT 131* 123XX123*   Basic Metabolic Panel Recent Labs    10/12/20 0500 10/13/20 0500  NA 134* 134*  K 4.1 3.8  CL 105 105  CO2 19* 20*  GLUCOSE 141* 124*  BUN 47* 48*  CREATININE 2.59* 2.82*  CALCIUM 8.1* 8.1*   Liver Function Tests No results for input(s): AST, ALT, ALKPHOS, BILITOT, PROT, ALBUMIN in the last 72 hours. No results for input(s): LIPASE, AMYLASE in the last 72 hours. Cardiac Enzymes No results for input(s): CKTOTAL, CKMB, CKMBINDEX, TROPONINI in the last 72 hours.  BNP: BNP (last 3 results) Recent Labs    10/07/20 0229  BNP 4,299.0*    ProBNP (last 3 results) No results for input(s): PROBNP in the last 8760 hours.   D-Dimer No results for input(s): DDIMER in the last 72 hours. Hemoglobin A1C No results for input(s): HGBA1C in the last 72 hours. Fasting Lipid Panel No results for input(s): CHOL, HDL, LDLCALC, TRIG, CHOLHDL, LDLDIRECT in the last 72 hours. Thyroid Function Tests No  results for input(s): TSH, T4TOTAL, T3FREE, THYROIDAB in the last 72 hours.  Invalid input(s): FREET3  Other results:   Imaging    No results found.   Medications:     Scheduled Medications: . atorvastatin  20 mg Oral Daily  . Chlorhexidine Gluconate Cloth  6 each Topical Daily  . insulin aspart  0-6 Units Subcutaneous Q4H  . pantoprazole  40 mg Oral Daily  . sodium chloride flush  3 mL Intravenous Q12H    Infusions: . sodium chloride    . amiodarone 60 mg/hr (10/13/20 0802)  . furosemide (LASIX) 200 mg in dextrose 5% 100 mL ('2mg'$ /mL) infusion 30 mg/hr (10/13/20  0035)  . heparin 1,400 Units/hr (10/11/20 0041)  . milrinone 0.25 mcg/kg/min (10/12/20 1227)    PRN Medications: sodium chloride, acetaminophen, albuterol, dextrose, ondansetron (ZOFRAN) IV, sodium chloride flush, sodium chloride flush  Assessment/Plan   1. A Flutter RVR - Started on amiodarone drip. Remains tachycardic with rates > 110-130 - Failed attempt at DCCV 3/18 - Continue IV amiodarone load, repeat DCCV this week after diuresis  - TSH normal - Continue IV heparin  2. 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  - Echo this admit w/ severe biventricular HF with EF < 15%.  BNP > 4000 . Suspect tachy-mediated. HS Trop - no trend. No chest pain. No LHC for now. Could consider ischemic work up later if renal function improves. Susepct he may need RHC if not responding to diuresis. - PICC placed initial Co-ox 40%. Started Milrinone 0.25 - Co-ox 54% today  - Remains markedly volume overloaded but responding to Lasix gtt - Continue Lasix gtt at 30/hr. Hold metolazone for today  - Check Urine Sodium  - No bb w/ low output  - No arb/dig/spiro with AKI  - c/w unna boots.   3. AKI on CKD Stage IIIa - Creatinine on admit 2.8.  Most recent creatinine was 1.55 back in 03/2020. - started on milrinone for low output - Creatinine relatively stabe w/ diuresis 2.8>2.7>>2.5>>2.4>>2.6 >> 2.6>2.8  - Follow renal function daily - Consider RHC  4. DMII - Hgb A1C 5.9  - On SSI  5. Lower Extremity Edema - Dopplers negative for DVT - 2/2 CHF Continue diuretics + unna boots   6. 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  -Continue PPI  7. HTN  - Pressures soft but stable - monitor w/ afib and w/ diuresis    Length of Stay: [address], PA-C  10/13/2020, 8:51 AM  Advanced Heart Failure Team Pager [phone] (M-F; 7a - 5p)  Please contact [facility] for night-coverage after hours (4p -7a ) and weekends on [email or web address]   Patient seen and  examined with the above-signed Advanced Practice Provider and/or Housestaff. I personally reviewed laboratory data, imaging studies and relevant notes. I independently examined the patient and formulated the important aspects of the plan. I have edited the note to reflect any of my changes or salient points. I have personally discussed the plan with the patient and/or family.  Remains in AF with RVR despite IV amio. Volume status markedly elevated. Now on lassix gtt at 55. Diuresis picking up some but not impressive. Denies SOB, orthopnea or PND. Co-ox 54% on milrinone. Creatinine creeping up some  General:  Weak appearing ling in bed. No resp difficulty HEENT: normal Neck: supple. JVP to ear  Carotids 2+ bilat; no bruits. No lymphadenopathy or thryomegaly appreciated. Cor: PMI nondisplaced. Irregular rtachy No  rubs, gallops or murmurs. Lungs: clear Abdomen: obese  soft, nontender, +distended. No hepatosplenomegaly. No bruits or masses. Good bowel sounds. Extremities: no cyanosis, clubbing, rash, 3+ edema Neuro: alert & orientedx3, cranial nerves grossly intact. moves all 4 extremities w/o difficulty. Affect pleasant  He remains very tenuous. Co-ox marginal on milrinone. Still in AF with RVR despite amio. On high-dose lasix with only modes urine output. Will continue lasix gtt at 30. If not making much progress may need to consider UF. Continue amio and heparin. Repeat DC-CV alter in the week when fluid status improved.   Glori Bickers, MD  11:13 AM

## 2020-10-13 NOTE — Progress Notes (Signed)
PROGRESS NOTE    Kyle Hicks  [id number] DOB: [birth year] DOA: 10/07/2020 PCP: Sharilyn Sites, MD    Brief Narrative:  Kyle Hicks is a 80 y.o. male with medical history significant of hypertension, diastolic CHF last EF [id number] with grade 1 diastolic dysfunction, diabetes mellitus type 2, and PUD with history of GI bleed presents after having a fall at home.  He reports that he had not been feeling well for the last 2 days.  This morning when he got up to use the restroom he reports that his left leg got weak and he fell to his side positive bowel sounds nontender the couch.  He did not hit his head or lose consciousness.  Denies any change in speech, vision, sensation.  He feels globally weak.  Noted associated symptoms of diarrhea, possibly some weight loss, leg swelling, shortness of breath, intermittent wheezing, and palpitations.  Patient has not noticed any blood or dark stools, but he had been getting up to have a stool every 25-30 minutes recently.  Denies having any significant cough, calf/leg pain, or orthopnea symptoms.  Wife notes that he had been very weak yesterday and stayed in the bathroom all day.  He had not been eating well and had been drinking Ensure recently.  3/17-monitor with a flutter RVR in the 130s 3/18-TEE cardioversion today was unsuccessful 3/19- monitor with afib rvr 125.  Overnight patient's heart rate remained in 140s and was given amiodarone iv load 3/20-monitor afib rvr 110-120 3/21-asx, tele HR 120's  Consultants:   cardiology  Procedures:   Antimicrobials:       Subjective: Denies shortness of breath, chest pain, palpitations or dizziness.  Objective: Vitals:   10/12/20 2334 10/13/20 0150 10/13/20 0354 10/13/20 0735  BP: 133/85  119/67   Pulse: 65  64   Resp: 18  16   Temp: 98.1 F (36.7 C)  97.9 F (36.6 C) 97.8 F (36.6 C)  TempSrc: Oral  Oral Oral  SpO2: 100%  98%   Weight:  119.6 kg    Height:        Intake/Output Summary  (Last 24 hours) at 10/13/2020 0831 Last data filed at 10/13/2020 0713 Gross per 24 hour  Intake 1441.95 ml  Output 2550 ml  Net -1108.05 ml   Filed Weights   10/11/20 0101 10/12/20 0153 10/13/20 0150  Weight: 118.3 kg 120.2 kg 119.6 kg    Examination: In chair, NAD Decreased breath sounds, no wheezing Irregular, Tachycardic S1-S2 no gallop Soft, positive bowel sounds nontender Positive lower extremity edema Alert oriented x3 Mood and affect appropriate in current setting   Data Reviewed: I have personally reviewed following labs and imaging studies  CBC: Recent Labs  Lab 10/07/20 0229 10/08/20 0119 10/09/20 0404 10/10/20 0707 10/11/20 0432 10/12/20 0500 10/13/20 0500  WBC 7.5   < > 6.1 5.5 6.2 5.3 5.3  NEUTROABS 5.9  --   --   --   --   --   --   HGB 13.5   < > 12.3* 11.1* 11.2* 10.4* 10.1*  HCT 43.9   < > 37.4* 34.3* 33.2* 31.8* 30.2*  MCV 91.1   < > 86.0 86.8 84.5 85.9 84.6  PLT 165   < > 105* 127* 155 131* 126*   < > = values in this interval not displayed.   Basic Metabolic Panel: Recent Labs  Lab 10/07/20 0229 10/07/20 2011 10/08/20 0119 10/09/20 0404 10/10/20 0707 10/11/20 0432 10/12/20 0500 10/13/20 0500  NA  139 135   < > 137 134* 136 134* 134*  K 5.1 5.1   < > 4.5 3.6 4.4 4.1 3.8  CL 113* 112*   < > 113* 106 107 105 105  CO2 13* 12*   < > 13* 17* 18* 19* 20*  GLUCOSE 73 140*   < > 105* 228* 151* 141* 124*  BUN 47* 49*   < > 48* 45* 46* 47* 48*  CREATININE 2.78* 2.63*   < > 2.51* 2.44* 2.63* 2.59* 2.82*  CALCIUM 8.7* 8.0*   < > 8.1* 7.8* 8.2* 8.1* 8.1*  MG 1.9 1.7  --   --   --   --   --   --    < > = values in this interval not displayed.   GFR: Estimated Creatinine Clearance: 26.7 mL/min (A) (by C-G formula based on SCr of 2.82 mg/dL (H)). Liver Function Tests: Recent Labs  Lab 10/07/20 0229  AST 29  ALT 26  ALKPHOS 49  BILITOT 1.4*  PROT 7.0  ALBUMIN 3.4*   No results for input(s): LIPASE, AMYLASE in the last 168 hours. No results  for input(s): AMMONIA in the last 168 hours. Coagulation Profile: No results for input(s): INR, PROTIME in the last 168 hours. Cardiac Enzymes: No results for input(s): CKTOTAL, CKMB, CKMBINDEX, TROPONINI in the last 168 hours. BNP (last 3 results) No results for input(s): PROBNP in the last 8760 hours. HbA1C: No results for input(s): HGBA1C in the last 72 hours. CBG: Recent Labs  Lab 10/12/20 1606 10/12/20 2007 10/12/20 2332 10/13/20 0351 10/13/20 0737  GLUCAP 148* 125* 130* 117* 103*   Lipid Profile: No results for input(s): CHOL, HDL, LDLCALC, TRIG, CHOLHDL, LDLDIRECT in the last 72 hours. Thyroid Function Tests: No results for input(s): TSH, T4TOTAL, FREET4, T3FREE, THYROIDAB in the last 72 hours. Anemia Panel: No results for input(s): VITAMINB12, FOLATE, FERRITIN, TIBC, IRON, RETICCTPCT in the last 72 hours. Sepsis Labs: Recent Labs  Lab 10/07/20 0229 10/07/20 0429  LATICACIDVEN 2.0* 1.8    Recent Results (from the past 240 hour(s))  Resp Panel by RT-PCR (Flu A&B, Covid) Nasopharyngeal Swab     Status: None   Collection Time: 10/07/20  3:07 AM   Specimen: Nasopharyngeal Swab; Nasopharyngeal(NP) swabs in vial transport medium  Result Value Ref Range Status   SARS Coronavirus 2 by RT PCR NEGATIVE NEGATIVE Final    Comment: (NOTE) SARS-CoV-2 target nucleic acids are NOT DETECTED.  The SARS-CoV-2 RNA is generally detectable in upper respiratory specimens during the acute phase of infection. The lowest concentration of SARS-CoV-2 viral copies this assay can detect is 138 copies/mL. A negative result does not preclude SARS-Cov-2 infection and should not be used as the sole basis for treatment or other patient management decisions. A negative result may occur with  improper specimen collection/handling, submission of specimen other than nasopharyngeal swab, presence of viral mutation(s) within the areas targeted by this assay, and inadequate number of  viral copies(<138 copies/mL). A negative result must be combined with clinical observations, patient history, and epidemiological information. The expected result is Negative.  Fact Sheet for Patients:  EntrepreneurPulse.com.au  Fact Sheet for Healthcare Providers:  IncredibleEmployment.be  This test is no t yet approved or cleared by the [place] FDA and  has been authorized for detection and/or diagnosis of SARS-CoV-2 by FDA under an Emergency Use Authorization (EUA). This EUA will remain  in effect (meaning this test can be used) for the duration of the COVID-19 declaration  under Section 564(b)(1) of the Act, 21 U.S.C.section 360bbb-3(b)(1), unless the authorization is terminated  or revoked sooner.       Influenza A by PCR NEGATIVE NEGATIVE Final   Influenza B by PCR NEGATIVE NEGATIVE Final    Comment: (NOTE) The Xpert Xpress SARS-CoV-2/FLU/RSV plus assay is intended as an aid in the diagnosis of influenza from Nasopharyngeal swab specimens and should not be used as a sole basis for treatment. Nasal washings and aspirates are unacceptable for Xpert Xpress SARS-CoV-2/FLU/RSV testing.  Fact Sheet for Patients: EntrepreneurPulse.com.au  Fact Sheet for Healthcare Providers: IncredibleEmployment.be  This test is not yet approved or cleared by the [place] FDA and has been authorized for detection and/or diagnosis of SARS-CoV-2 by FDA under an Emergency Use Authorization (EUA). This EUA will remain in effect (meaning this test can be used) for the duration of the COVID-19 declaration under Section 564(b)(1) of the Act, 21 U.S.C. section 360bbb-3(b)(1), unless the authorization is terminated or revoked.  Performed at [facility], [place] [address] [place], [place] [address]          Radiology Studies: No results found.      Scheduled Meds: . atorvastatin  20 mg Oral Daily  .  Chlorhexidine Gluconate Cloth  6 each Topical Daily  . insulin aspart  0-6 Units Subcutaneous Q4H  . pantoprazole  40 mg Oral Daily  . sodium chloride flush  3 mL Intravenous Q12H   Continuous Infusions: . sodium chloride    . amiodarone 60 mg/hr (10/13/20 0802)  . furosemide (LASIX) 200 mg in dextrose 5% 100 mL ('2mg'$ /mL) infusion 30 mg/hr (10/13/20 0035)  . heparin 1,400 Units/hr (10/11/20 0041)  . milrinone 0.25 mcg/kg/min (10/12/20 1227)    Assessment & Plan:   Principal Problem:   Acute CHF (congestive heart failure) (HCC) Active Problems:   Acute kidney injury superimposed on chronic kidney disease (HCC)   Essential hypertension   Elevated d-dimer   Obesity (BMI 30-39.9)   Diarrhea   Atrial flutter (HCC)   Atrial fibrillation (HCC)   Acute biventricular H failure Echo with EF now 10-15% RV severely reduced  BNP elevated Cardiology following picc placed for cvp monitoring-on 3/17 3/18-TEE today with severe biv dysfunction, mod-se MR, sev TR, no LAA thrombus 3/21-still volume overloaded, continue Lasix drip at 30 /hr, hold metolazone for today  May need right heart catheter tomorrow responding to diuresis  Started on milrinone  No ARB/dig/spironolactone with AKI  No BB with low output  Continue with Unna boots     Elevated D-dimer: Acute.  On admission D-dimer elevated at 4.72.  Unable to obtain a CT angiogram of the chest with contrast due to kidney function. Possibly may need VQ scan once able to Doppler negative for DVT 3/21-continue heparin drip per pharmacy    Acute kidney injury superimposed on chronic kidney disease stage IIIb: Patient presents with creatinine elevated up to 2.78 with BUN 47.  Baseline creatinine previously have been around 1.5.   Suspect hypoperfusion due to tachycardia and heart failure exacerbation./Cardiorenal syndrome 3/21-renal function worsening, today 2.8 monitor closely Start on milrinone follow-up with A consider right heart  cath If continues to worsen may consider nephrology consultation   Aflutter rvr Rate still not controlled, remains tachycardic  Failed DCCV  3/18 TSH normal  3/21-continue amiodarone, repeat DCCV I's this week after diuresis Continue heparin drip   Diabetes mellitus type 2, hypoglycemia: Acute.  On admission glucose 63.  Last hemoglobin A1c 6.5 in 03/2020.  Home medications include glipizide 5 mg daily, and Janumet 25- 500 mg tablet twice daily. 3/21-BG controlled  Continue R-ISS     Fall and generalized weakness: Patient presents after having a fall at home.  He reportedly has been having diarrhea all day yesterday which may be a contributing factor to his weakness. PT      Diarrhea: Acute.  Patient has not recently been on antibiotics, but reportedly has intermittent episodes of diarrhea. -Monitor intake and output -Consider checking C. difficile if diarrhea persist -none reported  Essential hypertension: On admission blood pressures were initially noted to be soft as 91/63. -Discontinued nifedipine per cardiology Hyperlipidemia -Continue atorvastatin 20 mg nightly  Prolonged QT interval: QTC 516  -Avoid QT prolonging medications  Obesity   DVT prophylaxis: heparin Code Status:FULL Family Communication: none at bedside  Status is: Inpatient  Remains inpatient appropriate because:Inpatient level of care appropriate due to severity of illness   Dispo: The patient is from: Home              Anticipated d/c is to: Home              Patient currently is not medically stable to d/c.   Difficult to place patient No  Patient still requires IV medications and is not medically stable.          LOS: 6 days   Time spent: 35 min with >50% on coc   Nolberto Hanlon, MD [facility] Pager [phone]  If 7PM-7AM, please contact night-coverage 10/13/2020, 8:31 AM

## 2020-10-13 NOTE — Progress Notes (Signed)
Physical Therapy Treatment Patient Details Name: Kyle Hicks MRN: [id number] DOB: 09/26/1940 Today's Date: 10/13/2020    History of Present Illness Pt is a 80 y.o. male admitted 10/07/20 with c/o diarrhea, weakness and fall at home. CXR with enlarged cardiac sillhouette, small R-side pleural effusion. Pt also with tachycardia, HF, AKI. Pt with aflutter RVR 3/17. 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 PMH includes HTN, CHF, DM2, CKD IIIa, PUD, GIB.   PT Comments    Pt progressing with mobility, remains limited by tachycardia at rest and with minimal exertion. HR up to 150s with standing activity this session. Will continue to follow acutely.   Follow Up Recommendations  Home health PT;Supervision for mobility/OOB     Equipment Recommendations  None recommended by PT (3in1 delivered)    Recommendations for Other Services       Precautions / Restrictions Precautions Precautions: Fall;Other (comment) Precaution Comments: Watch HR (Dr. Haroldine Laws says HR in 140s-150s ok) Restrictions Weight Bearing Restrictions: No    Mobility  Bed Mobility Overal bed mobility: Needs Assistance Bed Mobility: Supine to Sit     Supine to sit: Min assist;HOB elevated     General bed mobility comments: Increased time and effort with use of bed rails; cues to attend to task and perform as independently as possible; min A to assist trunk elevation and scoot hips to EOB    Transfers Overall transfer level: Needs assistance Equipment used: Rolling walker (2 wheeled) Transfers: Sit to/from Stand Sit to Stand: Min assist         General transfer comment: Multiple sit<>stands from bed, BSC and recliner; pt reliant on momentum to power into standing with RW; minA for trunk elevation and stability  Ambulation/Gait Ambulation/Gait assistance: Min guard Gait Distance (Feet): 6 Feet Assistive device: Rolling walker (2  wheeled) Gait Pattern/deviations: Step-to pattern;Trunk flexed;Wide base of support Gait velocity: Decreased   General Gait Details: Short ambulation distance to [facility] and recliner with RW and min guard for balance/lines management; deferred further distance secondary to tachycardia with symptomatic SOB   Stairs             Wheelchair Mobility    Modified Rankin (Stroke Patients Only)       Balance Overall balance assessment: Needs assistance Sitting-balance support: No upper extremity supported;Feet supported Sitting balance-[name] Scale: Fair       Standing balance-[name] Scale: Poor Standing balance comment: reliant on BUE support; assist for posterior pericare                            Cognition Arousal/Alertness: Awake/alert Behavior During Therapy: WFL for tasks assessed/performed;Flat affect Overall Cognitive Status: No family/caregiver present to determine baseline cognitive functioning Area of Impairment: Following commands;Problem solving;Awareness;Attention                   Current Attention Level: Selective   Following Commands: Follows one step commands consistently;Follows one step commands with increased time   Awareness: Emergent Problem Solving: Slow processing;Requires verbal cues        Exercises      General Comments General comments (skin integrity, edema, etc.): Supine BP 110s-130s, up to 152 with standing activity. SpO2 >/96% on RA; SOB with activity, seems improved from prior session      Pertinent Vitals/Pain Pain Assessment: No/denies pain    Home Living  Prior Function            PT Goals (current goals can now be found in the care plan section) Progress towards PT goals: Progressing toward goals    Frequency    Min 3X/week      PT Plan Current plan remains appropriate    Co-evaluation              AM-PAC PT "6 Clicks" Mobility   Outcome Measure  Help needed  turning from your back to your side while in a flat bed without using bedrails?: A Little Help needed moving from lying on your back to sitting on the side of a flat bed without using bedrails?: A Little Help needed moving to and from a bed to a chair (including a wheelchair)?: A Little Help needed standing up from a chair using your arms (e.g., wheelchair or bedside chair)?: A Little Help needed to walk in hospital room?: A Little Help needed climbing 3-5 steps with a railing? : A Lot 6 Click Score: 17    End of Session   Activity Tolerance: Treatment limited secondary to medical complications (Comment);Patient tolerated treatment well Patient left: in chair;with call bell/phone within reach;with chair alarm set;with nursing/sitter in room Nurse Communication: Mobility status PT Visit Diagnosis: Muscle weakness (generalized) (M62.81);Unsteadiness on feet (R26.81);Other abnormalities of gait and mobility (R26.89);History of falling (Z91.81)     Time: [id number] PT Time Calculation (min) (ACUTE ONLY): 15 min  Charges:  $Therapeutic Activity: 8-22 mins                     Mabeline Caras, PT, DPT Acute Rehabilitation Services  Pager [phone] Office [phone]  Derry Lory 10/13/2020, 10:25 AM

## 2020-10-13 NOTE — Progress Notes (Signed)
Pt sitting up in chair with no distress

## 2020-10-14 ENCOUNTER — Inpatient Hospital Stay (HOSPITAL_COMMUNITY): Payer: Medicare HMO

## 2020-10-14 DIAGNOSIS — I4819 Other persistent atrial fibrillation: Secondary | ICD-10-CM | POA: Diagnosis not present

## 2020-10-14 DIAGNOSIS — I5021 Acute systolic (congestive) heart failure: Secondary | ICD-10-CM | POA: Diagnosis not present

## 2020-10-14 DIAGNOSIS — N189 Chronic kidney disease, unspecified: Secondary | ICD-10-CM | POA: Diagnosis not present

## 2020-10-14 DIAGNOSIS — N179 Acute kidney failure, unspecified: Secondary | ICD-10-CM | POA: Diagnosis not present

## 2020-10-14 DIAGNOSIS — R197 Diarrhea, unspecified: Secondary | ICD-10-CM | POA: Diagnosis not present

## 2020-10-14 LAB — URINALYSIS, COMPLETE (UACMP) WITH MICROSCOPIC
Bilirubin Urine: NEGATIVE
Glucose, UA: NEGATIVE mg/dL
Ketones, ur: NEGATIVE mg/dL
Nitrite: NEGATIVE
Protein, ur: NEGATIVE mg/dL
Specific Gravity, Urine: 1.008 (ref 1.005–1.030)
pH: 5 (ref 5.0–8.0)

## 2020-10-14 LAB — CBC
HCT: 31.7 % — ABNORMAL LOW (ref 39.0–52.0)
Hemoglobin: 11 g/dL — ABNORMAL LOW (ref 13.0–17.0)
MCH: 28.9 pg (ref 26.0–34.0)
MCHC: 34.7 g/dL (ref 30.0–36.0)
MCV: 83.4 fL (ref 80.0–100.0)
Platelets: 155 10*3/uL (ref 150–400)
RBC: 3.8 MIL/uL — ABNORMAL LOW (ref 4.22–5.81)
RDW: 19.2 % — ABNORMAL HIGH (ref 11.5–15.5)
WBC: 5.9 10*3/uL (ref 4.0–10.5)
nRBC: 0 % (ref 0.0–0.2)

## 2020-10-14 LAB — BASIC METABOLIC PANEL WITH GFR
Anion gap: 11 (ref 5–15)
BUN: 50 mg/dL — ABNORMAL HIGH (ref 8–23)
CO2: 20 mmol/L — ABNORMAL LOW (ref 22–32)
Calcium: 8.4 mg/dL — ABNORMAL LOW (ref 8.9–10.3)
Chloride: 102 mmol/L (ref 98–111)
Creatinine, Ser: 3.02 mg/dL — ABNORMAL HIGH (ref 0.61–1.24)
GFR, Estimated: 21 mL/min — ABNORMAL LOW
Glucose, Bld: 134 mg/dL — ABNORMAL HIGH (ref 70–99)
Potassium: 3.8 mmol/L (ref 3.5–5.1)
Sodium: 133 mmol/L — ABNORMAL LOW (ref 135–145)

## 2020-10-14 LAB — GLUCOSE, CAPILLARY
Glucose-Capillary: 114 mg/dL — ABNORMAL HIGH (ref 70–99)
Glucose-Capillary: 130 mg/dL — ABNORMAL HIGH (ref 70–99)
Glucose-Capillary: 131 mg/dL — ABNORMAL HIGH (ref 70–99)
Glucose-Capillary: 136 mg/dL — ABNORMAL HIGH (ref 70–99)
Glucose-Capillary: 139 mg/dL — ABNORMAL HIGH (ref 70–99)
Glucose-Capillary: 180 mg/dL — ABNORMAL HIGH (ref 70–99)

## 2020-10-14 LAB — COOXEMETRY PANEL
Carboxyhemoglobin: 0.9 % (ref 0.5–1.5)
Methemoglobin: 0.9 % (ref 0.0–1.5)
O2 Saturation: 61.6 %
Total hemoglobin: 11 g/dL — ABNORMAL LOW (ref 12.0–16.0)

## 2020-10-14 LAB — HEPARIN LEVEL (UNFRACTIONATED): Heparin Unfractionated: 0.42 [IU]/mL (ref 0.30–0.70)

## 2020-10-14 NOTE — Progress Notes (Signed)
PROGRESS NOTE    Kyle Hicks  [id number] DOB: [birth year] DOA: 10/07/2020 PCP: Sharilyn Sites, MD    Brief Narrative:  Kyle Hicks is a 80 y.o. male with medical history significant of hypertension, diastolic CHF last EF [id number] with grade 1 diastolic dysfunction, diabetes mellitus type 2, and PUD with history of GI bleed presents after having a fall at home.  He reports that he had not been feeling well for the last 2 days.  Has been in aflutter rvr and found with acute SHF on echo EF <20% On 3/18-TEE cardioversion unsuccessful. Continued on amiodaonre gtt. HR still not controlled.On milrinone drip and Lasix drip. Cardiology following and managing his HR and CHF. His renal function is worsening.  3/22-tele aflutter Kyle Hicks HR 120-130's  Consultants:   cardiology  Procedures:  TEE DCCV 3/18-failed  Antimicrobials:       Subjective: Sitting in chair, denies shortness of breath palpitations dizziness chest pain  Objective: Vitals:   10/13/20 1939 10/14/20 0000 10/14/20 0400 10/14/20 0428  BP: 131/75 108/69 125/86   Pulse:   67   Resp: (!) '23 20 20   '$ Temp: 98.3 F (36.8 C) 98 F (36.7 C) 98 F (36.7 C)   TempSrc: Oral Oral Oral   SpO2: 98% 96% 98%   Weight:    116.4 kg  Height:        Intake/Output Summary (Last 24 hours) at 10/14/2020 0824 Last data filed at 10/14/2020 0428 Gross per 24 hour  Intake 620 ml  Output 1850 ml  Net -1230 ml   Filed Weights   10/12/20 0153 10/13/20 0150 10/14/20 0428  Weight: 120.2 kg 119.6 kg 116.4 kg    Examination: Nad, calm cta no w/r Irregular tachy, s1/s2 Soft benign +bs +edema , wrapped Aaxoxo3, grossly intact  Data Reviewed: I have personally reviewed following labs and imaging studies  CBC: Recent Labs  Lab 10/10/20 0707 10/11/20 0432 10/12/20 0500 10/13/20 0500 10/14/20 0406  WBC 5.5 6.2 5.3 5.3 5.9  HGB 11.1* 11.2* 10.4* 10.1* 11.0*  HCT 34.3* 33.2* 31.8* 30.2* 31.7*  MCV 86.8 84.5 85.9 84.6 83.4  PLT  127* 155 131* 126* [id number]   Basic Metabolic Panel: Recent Labs  Lab 10/07/20 2011 10/08/20 0119 10/10/20 0707 10/11/20 0432 10/12/20 0500 10/13/20 0500 10/14/20 0406  NA 135   < > 134* 136 134* 134* 133*  K 5.1   < > 3.6 4.4 4.1 3.8 3.8  CL 112*   < > 106 107 105 105 102  CO2 12*   < > 17* 18* 19* 20* 20*  GLUCOSE 140*   < > 228* 151* 141* 124* 134*  BUN 49*   < > 45* 46* 47* 48* 50*  CREATININE 2.63*   < > 2.44* 2.63* 2.59* 2.82* 3.02*  CALCIUM 8.0*   < > 7.8* 8.2* 8.1* 8.1* 8.4*  MG 1.7  --   --   --   --   --   --    < > = values in this interval not displayed.   GFR: Estimated Creatinine Clearance: 24.6 mL/min (A) (by C-G formula based on SCr of 3.02 mg/dL (H)). Liver Function Tests: No results for input(s): AST, ALT, ALKPHOS, BILITOT, PROT, ALBUMIN in the last 168 hours. No results for input(s): LIPASE, AMYLASE in the last 168 hours. No results for input(s): AMMONIA in the last 168 hours. Coagulation Profile: No results for input(s): INR, PROTIME in the last 168 hours. Cardiac Enzymes: No results for  input(s): CKTOTAL, CKMB, CKMBINDEX, TROPONINI in the last 168 hours. BNP (last 3 results) No results for input(s): PROBNP in the last 8760 hours. HbA1C: No results for input(s): HGBA1C in the last 72 hours. CBG: Recent Labs  Lab 10/13/20 1622 10/13/20 2001 10/14/20 0002 10/14/20 0414 10/14/20 0821  GLUCAP 131* 145* 130* 131* 139*   Lipid Profile: No results for input(s): CHOL, HDL, LDLCALC, TRIG, CHOLHDL, LDLDIRECT in the last 72 hours. Thyroid Function Tests: No results for input(s): TSH, T4TOTAL, FREET4, T3FREE, THYROIDAB in the last 72 hours. Anemia Panel: No results for input(s): VITAMINB12, FOLATE, FERRITIN, TIBC, IRON, RETICCTPCT in the last 72 hours. Sepsis Labs: No results for input(s): PROCALCITON, LATICACIDVEN in the last 168 hours.  Recent Results (from the past 240 hour(s))  Resp Panel by RT-PCR (Flu A&B, Covid) Nasopharyngeal Swab     Status: None    Collection Time: 10/07/20  3:07 AM   Specimen: Nasopharyngeal Swab; Nasopharyngeal(NP) swabs in vial transport medium  Result Value Ref Range Status   SARS Coronavirus 2 by RT PCR NEGATIVE NEGATIVE Final    Comment: (NOTE) SARS-CoV-2 target nucleic acids are NOT DETECTED.  The SARS-CoV-2 RNA is generally detectable in upper respiratory specimens during the acute phase of infection. The lowest concentration of SARS-CoV-2 viral copies this assay can detect is 138 copies/mL. A negative result does not preclude SARS-Cov-2 infection and should not be used as the sole basis for treatment or other patient management decisions. A negative result may occur with  improper specimen collection/handling, submission of specimen other than nasopharyngeal swab, presence of viral mutation(s) within the areas targeted by this assay, and inadequate number of viral copies(<138 copies/mL). A negative result must be combined with clinical observations, patient history, and epidemiological information. The expected result is Negative.  Fact Sheet for Patients:  EntrepreneurPulse.com.au  Fact Sheet for Healthcare Providers:  IncredibleEmployment.be  This test is no t yet approved or cleared by the [place] FDA and  has been authorized for detection and/or diagnosis of SARS-CoV-2 by FDA under an Emergency Use Authorization (EUA). This EUA will remain  in effect (meaning this test can be used) for the duration of the COVID-19 declaration under Section 564(b)(1) of the Act, 21 U.S.C.section 360bbb-3(b)(1), unless the authorization is terminated  or revoked sooner.       Influenza A by PCR NEGATIVE NEGATIVE Final   Influenza B by PCR NEGATIVE NEGATIVE Final    Comment: (NOTE) The Xpert Xpress SARS-CoV-2/FLU/RSV plus assay is intended as an aid in the diagnosis of influenza from Nasopharyngeal swab specimens and should not be used as a sole basis for treatment.  Nasal washings and aspirates are unacceptable for Xpert Xpress SARS-CoV-2/FLU/RSV testing.  Fact Sheet for Patients: EntrepreneurPulse.com.au  Fact Sheet for Healthcare Providers: IncredibleEmployment.be  This test is not yet approved or cleared by the [place] FDA and has been authorized for detection and/or diagnosis of SARS-CoV-2 by FDA under an Emergency Use Authorization (EUA). This EUA will remain in effect (meaning this test can be used) for the duration of the COVID-19 declaration under Section 564(b)(1) of the Act, 21 U.S.C. section 360bbb-3(b)(1), unless the authorization is terminated or revoked.  Performed at [facility], [place] [address] [place], [place] [address]          Radiology Studies: No results found.      Scheduled Meds: . atorvastatin  20 mg Oral Daily  . Chlorhexidine Gluconate Cloth  6 each Topical Daily  . insulin aspart  0-6 Units Subcutaneous Q4H  . pantoprazole  40 mg Oral Daily  . sodium chloride flush  3 mL Intravenous Q12H   Continuous Infusions: . sodium chloride    . amiodarone 60 mg/hr (10/14/20 [facility]:[id number])  . furosemide (LASIX) 200 mg in dextrose 5% 100 mL ('2mg'$ /mL) infusion 30 mg/hr (10/14/20 0220)  . heparin 1,400 Units/hr (10/13/20 1542)  . milrinone 0.25 mcg/kg/min (10/14/20 0800)    Assessment & Plan:   Principal Problem:   Acute CHF (congestive heart failure) (HCC) Active Problems:   Acute kidney injury superimposed on chronic kidney disease (HCC)   Essential hypertension   Elevated d-dimer   Obesity (BMI 30-39.9)   Diarrhea   Atrial flutter (HCC)   Atrial fibrillation (HCC)   Acute biventricular H failure Echo with EF now 10-15% RV severely reduced  BNP elevated picc placed for cvp monitoring-on 3/17 3/18-TEE today with severe biv dysfunction, mod-se MR, sev TR, no LAA thrombus 3/22-  continue Lasix drip And metolazone was held May need right heart cath if he does  not progress much Continue milrinone Continue with Unna boots No ARB/dig/spironolactone with AKI No beta-blocker Lovenox I and O Daily weight Follow-up with cardiology for any new recommendation   Elevated D-dimer: Acute.  On admission D-dimer elevated at 4.72.  Unable to obtain a CT angiogram of the chest with contrast due to kidney function. Possibly may need VQ scan once able to Doppler negative for DVT 3/22 continue heparin drip per pharmacy       Acute kidney injury superimposed on chronic kidney disease stage IIIb: Patient presents with creatinine elevated up to 2.78 with BUN 47.  Baseline creatinine previously have been around 1.5.   Suspect hypoperfusion due to tachycardia and heart failure exacerbation./Cardiorenal syndrome 3/22 renal function worsening, creatinine 3.02  On milrinone and Lasix drip  We will consult nephrology for further input     Aflutter/fib rvr Rate still not controlled, remains tachycardic  Failed DCCV  3/18 TSH normal  3/22 heart rate still not well controlled, and 120s to 130s. Continue amiodarone drip Continue heparin drip Repeat DCCV this week after good diuresis     Diabetes mellitus type 2, hypoglycemia: Acute.  On admission glucose 63.  Last hemoglobin A1c 6.5 in 03/2020.  Home medications include glipizide 5 mg daily, and Janumet 25- 500 mg tablet twice daily. 3/22 -EEG controlled  Continue R-ISS      Fall and generalized weakness: Patient presents after having a fall at home.  He reportedly has been having diarrhea all day yesterday which may be a contributing factor to his weakness. PT      Diarrhea: Acute.  Patient has not recently been on antibiotics, but reportedly has intermittent episodes of diarrhea. -Monitor intake and output -Consider checking C. difficile if diarrhea persist -none reported  Essential hypertension: On admission blood pressures were initially noted to be soft as 91/63. -Discontinued  nifedipine per cardiology   Hyperlipidemia -Continue atorvastatin 20 mg nightly  Prolonged QT interval: QTC 516  -Avoid QT prolonging medications  Obesity   DVT prophylaxis: heparin Code Status:FULL Family Communication: none at bedside  Status is: Inpatient  Remains inpatient appropriate because:Inpatient level of care appropriate due to severity of illness   Dispo: The patient is from: Home              Anticipated d/c is to: Home              Patient currently is not medically stable to d/c.  Difficult to place patient No  Patient still requires IV medications and is not medically stable.          LOS: 7 days   Time spent: 35 min with >50% on coc   Nolberto Hanlon, MD [facility] Pager [phone]  If 7PM-7AM, please contact night-coverage 10/14/2020, 8:24 AM

## 2020-10-14 NOTE — Consult Note (Signed)
Nephrology Consult   Requesting provider: Dr. Nolberto Hanlon Service requesting consult: [facility]  Reason for consult: AKI   Assessment/Recommendations: Kyle Hicks is a/an 80 y.o. 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 Fib/A. Flutter, CKD Stage 3a, T2DM, HTN, obesity.  1. Non-Oliguric AKI: Likely a combination of hypoperfusion secondary to tachycardia and vascular congestion secondary to cardiorenal syndrome. Urine sodium demonstrates appropriate response to Lasix. If diuresis continues to be difficult, may benefit from short course of RRT. No current emergent indications for HD. No signs or symptoms of uremic syndrome.   -Diuretics per Heart Failure team. Okay to restart Metolazone from a nephrology standpoint -Continue to monitor daily Cr, Dose meds for GFR<15 -Monitor Daily I/Os, Daily weight  -Obtain urine sample for sediment analysis -Check Renal U/S  -Maintain MAP>65 for optimal renal perfusion.  -Agree with holding ACE-I, avoid further nephrotoxins including NSAIDS, Morphine. Unless absolutely necessary, avoid CT with contrast and/or MRI with gadolinium.     2. 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  UACR initially normal in 2018; markedly elevated in 2021 at 612. Additional differential includes obesity related GN.  Baseline creatinine was previously 1.2 - 1.6.   -Referral to Nephrology on discharge recommended -Urine studies once stabilized or in the outpatient basis  3. Acute Biventricular Heart Failure: Previous hx of HFpEF with progression to HFrEF with EF of 10-15%; suspected to be in the setting of A. Fib with RVR. Significantly volume overloaded with difficulty diuresing. He is currently on Lasix gtt and Milrinone with Metolazane being held.  -HF following  4. A. Fib with RVR: Failed DCCV on 3/18.  Plan to repeat next week after diuresis. Currently on Amiodarone.   5. Type 2 Diabetes Mellitus: Per chart review, A1c mostly below 7% with the exception of 1-2 readings since 2014. Follows with Dr. Gae Bon (Endocrinology). Current medication management prior to admission: Glipizide and Sitagliptin-Metformin.  -SSI (resistant)   6. Hx of HTN: Previously on HCTZ, Nifedipine and Ramipril. Currently all three are being held. BP has been on the lower spectrum of normal.  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 At the time, patient refused further evaluation. LFTs WNL on admission. No obvious sequela of cirrhosis on examination. May benefit from repeat imaging.   8. HLD: Managed with Atoravastatin   Recommendations conveyed to primary service.   Dr. Jose Persia Internal Medicine PGY-2  10/14/2020, 11:47 AM ____________________________________________________________________________   History of Present Illness: Kyle Hicks is a/an 80 y.o. male with a past medical history of HFpEF with progression to severe biventricular heart failure (EF 10-15%), A. Fib/A. Flutter, CKD Stage 3a, T2DM, HTN, obesity who presents to [facility] with malaise.   At the time, patient also endorses diarrhea, SOB, and palpitations with generalized weakness and poor PO intake. On admission patient was noted to be in A. Fib with RVR with mild respiratory distress requiring supplemental oxygen. Lab work significant for elevated BNP at 4299, troponin 75 > 81, D-dimer 4.72. NAGMA with bicarb of 13. TEE on 3/18 showing new biventricular heart failure with mod-severe MR, severe TR. DCCV was unsuccessful. He is currently being managed with Amiodarone gtt for A. Fib, still in RVR. Patient was noted to be hypervolemic, currently on Lasix gtt, Milrinone and previously Metolazone as well. Lasix  gtt @ 20cc/hr started on 3/19 after failed to diurese with intermittent  pushes. Increased to 30 cc/hr on the 20th.   Today, Kyle Hicks states that he has noticed generalized swelling especially in his legs for approximately 4 to 5 days before admission.  He also noted that he was pain more often.  Prior to this onset, he denies any previous history of peripheral edema.  At this time, he denies any headaches, dizziness, tinnitus, hearing loss, chest pain, shortness of breath, abdominal swelling, difficulty with urination, hematuria, dysuria, nausea, vomiting, lack of appetite, dysgeusia.  He does endorse generalized edema that makes his legs feel heavy.  No other acute complaints  Per chart review, patient has a history of CKD Stage 3a with elevated UACR of 612 (May 2021). He has never seen Nephrology. Creatinine available dating back to 2014, at which time it was elevated at 2.67 in the setting of admission for DKA. Creatinine improved to 1.48 on discharge. No labs available between 2014 - 2017. Since 2017 till 2021, creatinine has fluctuated between 1.2 - 1.6. Prior to admission, last creatinine was 1.55. On this admission, patient's creatinine was initially elevated at 2.78 and has gradually increased up to 3.02 today. UA on admission showed mild proteinuria with small hematuria and ketonuria. Calcium oxalate crystals present. Urine sodium obtained yesterday and results were 116.  Patient states he has a 10 to 15-year history of diabetes that has been relatively well controlled for the last 8 years.  He follows with endocrinology.  Denies history of kidney stones.  Medications:  Current Facility-Administered Medications  Medication Dose Route Frequency Provider Last Rate Last Admin   0.9 %  sodium chloride infusion  250 mL Intravenous PRN Lelon Perla, MD       acetaminophen (TYLENOL) tablet 650 mg  650 mg Oral Q4H PRN Lelon Perla, MD       albuterol (PROVENTIL) (2.5 MG/3ML) 0.083% nebulizer solution 2.5 mg  2.5 mg Nebulization Q6H PRN Lelon Perla, MD        amiodarone (NEXTERONE PREMIX) 360-4.14 MG/200ML-% (1.8 mg/mL) IV infusion  60 mg/hr Intravenous Continuous Lelon Perla, MD 33.3 mL/hr at 10/14/20 0638 60 mg/hr at 10/14/20 [id number]   atorvastatin (LIPITOR) tablet 20 mg  20 mg Oral Daily Lelon Perla, MD   20 mg at 10/14/20 [id number]   Chlorhexidine Gluconate Cloth 2 % PADS 6 each  6 each Topical Daily Lelon Perla, MD   6 each at 10/14/20 0847   dextrose 50 % solution 50 mL  1 ampule Intravenous PRN Lelon Perla, MD       furosemide (LASIX) 200 mg in dextrose 5 % 100 mL (2 mg/mL) infusion  30 mg/hr Intravenous Continuous Bensimhon, Shaune Pascal, MD 15 mL/hr at 10/14/20 0220 30 mg/hr at 10/14/20 0220   heparin ADULT infusion 100 units/mL (25000 units/222m)  1,400 Units/hr Intravenous Continuous CLelon Perla MD 14 mL/hr at 10/14/20 1124 1,400 Units/hr at 10/14/20 1124   insulin aspart (novoLOG) injection 0-6 Units  0-6 Units Subcutaneous Q4H CLelon Perla MD   0 Units at 10/13/20 1241   milrinone (PRIMACOR) 20 MG/100 ML (0.2 mg/mL) infusion  0.25 mcg/kg/min Intravenous Continuous CLelon Perla MD 8.82 mL/hr at 10/14/20 0800 0.25 mcg/kg/min at 10/14/20 0800   ondansetron (ZOFRAN) injection 4 mg  4 mg Intravenous Q6H PRN CLelon Perla MD       pantoprazole (PROTONIX) EC tablet 40 mg  40 mg Oral Daily Crenshaw,  Denice Bors, MD   40 mg at 10/14/20 0846   sodium chloride flush (NS) 0.9 % injection 10-40 mL  10-40 mL Intracatheter PRN Lelon Perla, MD       sodium chloride flush (NS) 0.9 % injection 3 mL  3 mL Intravenous Q12H Lelon Perla, MD   3 mL at 10/14/20 0847   sodium chloride flush (NS) 0.9 % injection 3 mL  3 mL Intravenous PRN Lelon Perla, MD        ALLERGIES Patient has no known allergies.  MEDICAL HISTORY Past Medical History:  Diagnosis Date   Diabetes mellitus without complication (HCC)    Diastolic CHF, acute ([place]) 10/08/2020   HTN (hypertension)    SOCIAL HISTORY Social  History   Socioeconomic History   Marital status: Married    Spouse name: Not on file   Number of children: Not on file   Years of education: Not on file   Highest education level: Not on file  Occupational History   Not on file  Tobacco Use   Smoking status: Former Smoker    Packs/day: 0.50    Types: Cigarettes    Quit date: 07/26/1987    Years since quitting: 33.2   Smokeless tobacco: Never Used  Vaping Use   Vaping Use: Never used  Substance and Sexual Activity   Alcohol use: No    Alcohol/week: 0.0 standard drinks    Comment: Previously drank socially, last drank 25-30 years ago   Drug use: No   Sexual activity: Not on file  Other Topics Concern   Not on file  Social History Narrative   Not on file   Social Determinants of Health   Financial Resource Strain: Not on file  Food Insecurity: Not on file  Transportation Needs: Not on file  Physical Activity: Not on file  Stress: Not on file  Social Connections: Not on file  Intimate Partner Violence: Not on file    FAMILY HISTORY Family History  Problem Relation Age of Onset   Hypertension Mother    Hyperlipidemia Mother    Hypertension Father    Hyperlipidemia Father    Colon cancer Neg Hx     No family history of kidney disease  Review of Systems: 12 systems reviewed Otherwise as per HPI, all other systems reviewed and negative  Physical Exam: Vitals:   10/14/20 0400 10/14/20 0844  BP: 125/86 128/68  Pulse: 67   Resp: 20 16  Temp: 98 F (36.7 C)   SpO2: 98% 98%   Total I/O In: 200 [P.O.:200] Out: 250 [Urine:250]  Intake/Output Summary (Last 24 hours) at 10/14/2020 1146 Last data filed at 10/14/2020 0900 Gross per 24 hour  Intake 580 ml  Output 2100 ml  Net -1520 ml   General: Morbidly obese, well-appearing, no acute distress HEENT: anicteric sclera, oropharynx clear without lesions CV: Tachycardic with rates approximately 120, regularly irregular rhythm, unable to  appreciate murmurs, 2+ pitting edema of the lower extremities extending to the upper thighs, 1+ pitting edema of the upper extremities.  Difficult to assess JVP Lungs: Anterior lung fields examined only as patient was eating.  Diffuse wheezing noted.  Slightly increased work of breathing. Abd: soft, non-tender, non-distended Skin: no visible lesions or rashes.  Unna boots in place in bilateral lower extremities Psych: alert, engaged, appropriate mood and affect Musculoskeletal: no obvious deformities Neuro: normal speech, no gross focal deficits   Test Results Reviewed Lab Results  Component Value Date   NA  133 (L) 10/14/2020   K 3.8 10/14/2020   CL 102 10/14/2020   CO2 20 (L) 10/14/2020   BUN 50 (H) 10/14/2020   CREATININE 3.02 (H) 10/14/2020   CALCIUM 8.4 (L) 10/14/2020   ALBUMIN 3.4 (L) 10/07/2020   PHOS 2.9 06/20/2017   I have reviewed all relevant outside healthcare records related to the patient's kidney injury.

## 2020-10-14 NOTE — Progress Notes (Signed)
OT Cancellation Note  Patient Details Name: Kyle Hicks MRN: [id number] DOB: September 28, 1940   Cancelled Treatment:    Reason Eval/Treat Not Completed: Patient at procedure or test/ unavailable;Other (comment) pt out of room at renal ultrasound, will check back as time allows.  Corinne Ports K., COTA/L Acute Rehabilitation Services [phone]   Kyle Hicks 10/14/2020, 4:14 PM

## 2020-10-14 NOTE — Progress Notes (Signed)
Physical Therapy Treatment Patient Details Name: Kyle Hicks MRN: [id number] DOB: 1941/07/09 Today's Date: 10/14/2020    History of Present Illness Pt is a 80 y.o. male admitted 10/07/20 with c/o diarrhea, weakness and fall at home. CXR with enlarged cardiac sillhouette, small R-side pleural effusion. Pt also with tachycardia, HF, AKI. Pt with aflutter RVR 3/17. 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 PMH includes HTN, CHF, DM2, CKD IIIa, PUD, GIB.   PT Comments    Pt progressing with mobility; remains limited by tachycardia up to 150s with standing activity and therex. Hopeful to progress ambulation distance next session with chair follow to allow for seated rest breaks while monitoring pt's HR. SOB with activity seems to be improving, although still present. Will continue to follow acutely.    Follow Up Recommendations  Home health PT;Supervision for mobility/OOB     Equipment Recommendations  None recommended by PT    Recommendations for Other Services       Precautions / Restrictions Precautions Precautions: Fall;Other (comment) Precaution Comments: Watch HR (Dr. Haroldine Laws says HR in 140s-150s ok) Restrictions Weight Bearing Restrictions: No    Mobility  Bed Mobility Overal bed mobility: Needs Assistance Bed Mobility: Supine to Sit     Supine to sit: Min assist;HOB elevated     General bed mobility comments: Increased time and effort with use of bed rails; minA for trunk elevation and scoot hips to EOB    Transfers Overall transfer level: Needs assistance Equipment used: Rolling walker (2 wheeled) Transfers: Sit to/from Stand Sit to Stand: Mod assist;Min guard         General transfer comment: Initial modA to stand from low bed height to RW; repeated sit<>stands from recliner with RW, repeated cues for correct hand placement  Ambulation/Gait Ambulation/Gait assistance: Min guard Gait  Distance (Feet): 6 Feet Assistive device: Rolling walker (2 wheeled) Gait Pattern/deviations: Step-to pattern;Trunk flexed;Wide base of support Gait velocity: Decreased   General Gait Details: Short ambulation distance to recliner with RW and min guard for balance/lines management; deferred further distance secondary to tachycardia (HR 150) with symptomatic SOB, although noted improvements in SOB   Stairs             Wheelchair Mobility    Modified Rankin (Stroke Patients Only)       Balance Overall balance assessment: Needs assistance Sitting-balance support: No upper extremity supported;Feet supported Sitting balance-[name] Scale: Fair     Standing balance support: Bilateral upper extremity supported Standing balance-[name] Scale: Poor Standing balance comment: reliant on BUE support                            Cognition Arousal/Alertness: Awake/alert Behavior During Therapy: Flat affect Overall Cognitive Status: No family/caregiver present to determine baseline cognitive functioning Area of Impairment: Following commands;Problem solving;Awareness;Attention                   Current Attention Level: Selective   Following Commands: Follows one step commands consistently;Follows one step commands with increased time   Awareness: Emergent Problem Solving: Slow processing;Requires verbal cues General Comments: Apparent cognitive deficits likely exacerbated by [facility]      Exercises Other Exercises Other Exercises: 3x repeated sit<>stand (HR up to 150); seated LAQ, ankle pumps    General Comments General comments (skin integrity, edema, etc.): HR up to 150s with activity. Pt c/o buttocks soreness sitting in chair yesterday; today, built up  chair with blue pressure relief pillow and additional bed pillow, pt notes significant improvement      Pertinent Vitals/Pain Pain Assessment: No/denies pain    Home Living                      Prior  Function            PT Goals (current goals can now be found in the care plan section) Progress towards PT goals: Progressing toward goals (slowly)    Frequency    Min 3X/week      PT Plan Current plan remains appropriate    Co-evaluation              AM-PAC PT "6 Clicks" Mobility   Outcome Measure  Help needed turning from your back to your side while in a flat bed without using bedrails?: A Little Help needed moving from lying on your back to sitting on the side of a flat bed without using bedrails?: A Little Help needed moving to and from a bed to a chair (including a wheelchair)?: A Little Help needed standing up from a chair using your arms (e.g., wheelchair or bedside chair)?: A Lot Help needed to walk in hospital room?: A Little Help needed climbing 3-5 steps with a railing? : A Lot 6 Click Score: 16    End of Session   Activity Tolerance: Patient tolerated treatment well;Treatment limited secondary to medical complications (Comment) (tachycardia) Patient left: in chair;with call bell/phone within reach Nurse Communication: Mobility status PT Visit Diagnosis: Muscle weakness (generalized) (M62.81);Unsteadiness on feet (R26.81);Other abnormalities of gait and mobility (R26.89);History of falling (Z91.81)     Time: [id number] PT Time Calculation (min) (ACUTE ONLY): 24 min  Charges:  $Therapeutic Exercise: 8-22 mins $Therapeutic Activity: 8-22 mins                     Mabeline Caras, PT, DPT Acute Rehabilitation Services  Pager [phone] Office [place] 10/14/2020, 11:46 AM

## 2020-10-14 NOTE — Progress Notes (Addendum)
Advanced Heart Failure Rounding Note  PCP-Cardiologist: No primary care provider on file.    Patient Profile   80 y/o male with obesity and HTN. Admitted with acute HF in setting of AF with RVR of unknown duration.   Echo LVEF 10-15%. RV severely reduced. PICC placed with initial co-ox 40% suggesting markedly low output. CVP up  Subjective:    TEE 3/18 w/ severe Biv dysfunction, Mod-severe MR, severe TR. No LAA thrombus. DCCV unsuccessful   Remains on Amio gtt at 60/hr. Still in Afib w/ RVR 130s . SBP low 100s.  On Milrinone 0.25. Co-ox 62%    Refractory marked fluid overload despite high dose lasix gtt, on 30/hr. Diuresing but suboptimal response. Only - 2.8L in UOP yesterday + 1 unmeasured urine occurrence. CVP 18. SCr trending up. 2.59>2.82>3.02.   Urine sodium normal at 116   K 3.8  Sitting up in bed eating a sandwich. No resting dyspnea. Denies palpitations feels ok.   Objective:   Weight Range: 116.4 kg Body mass index is 41.42 kg/m.   Vital Signs:   Temp:  [98 F (36.7 C)-98.3 F (36.8 C)] 98 F (36.7 C) (03/22 0400) Pulse Rate:  [67-120] 67 (03/22 0400) Resp:  [16-23] 16 (03/22 0844) BP: (108-131)/(68-86) 128/68 (03/22 0844) SpO2:  [96 %-98 %] 98 % (03/22 0844) Weight:  [116.4 kg] 116.4 kg (03/22 0428) Last BM Date: 10/13/20  Weight change: Filed Weights   10/12/20 0153 10/13/20 0150 10/14/20 0428  Weight: 120.2 kg 119.6 kg 116.4 kg    Intake/Output:   Intake/Output Summary (Last 24 hours) at 10/14/2020 1112 Last data filed at 10/14/2020 0428 Gross per 24 hour  Intake 380 ml  Output 1850 ml  Net -1470 ml      Physical Exam    CVP 18 General: obese, elderly male, sitting up in bed. No resp difficulty HEENT:dentition in poor repair, otherwise normal   Neck: supple. JVP to jaw. Carotids 2+ bilat; no bruits. No lymphadenopathy or thryomegaly appreciated. Cor: PMI nondisplaced. Irregularly irregular rhythm, tachy rate  Lungs: decreased BS  at the bases, no wheezing Abdomen: obese soft, nontender, nondistended. No hepatosplenomegaly. No bruits or masses. Good bowel sounds. Extremities: no cyanosis, clubbing, rash, 2+ bilateral LEE up to thighs, bilateral unna boots Neuro: alert & orientedx3, cranial nerves grossly intact. moves all 4 extremities w/o difficulty. Affect pleasant  Telemetry   Afib w/ RVR 120-130s  Personally reviewed  Labs    CBC Recent Labs    10/13/20 0500 10/14/20 0406  WBC 5.3 5.9  HGB 10.1* 11.0*  HCT 30.2* 31.7*  MCV 84.6 83.4  PLT 126* [id number]   Basic Metabolic Panel Recent Labs    10/13/20 0500 10/14/20 0406  NA 134* 133*  K 3.8 3.8  CL 105 102  CO2 20* 20*  GLUCOSE 124* 134*  BUN 48* 50*  CREATININE 2.82* 3.02*  CALCIUM 8.1* 8.4*   Liver Function Tests No results for input(s): AST, ALT, ALKPHOS, BILITOT, PROT, ALBUMIN in the last 72 hours. No results for input(s): LIPASE, AMYLASE in the last 72 hours. Cardiac Enzymes No results for input(s): CKTOTAL, CKMB, CKMBINDEX, TROPONINI in the last 72 hours.  BNP: BNP (last 3 results) Recent Labs    10/07/20 0229  BNP 4,299.0*    ProBNP (last 3 results) No results for input(s): PROBNP in the last 8760 hours.   D-Dimer No results for input(s): DDIMER in the last 72 hours. Hemoglobin A1C No results for input(s): HGBA1C in the  last 72 hours. Fasting Lipid Panel No results for input(s): CHOL, HDL, LDLCALC, TRIG, CHOLHDL, LDLDIRECT in the last 72 hours. Thyroid Function Tests No results for input(s): TSH, T4TOTAL, T3FREE, THYROIDAB in the last 72 hours.  Invalid input(s): FREET3  Other results:   Imaging    No results found.   Medications:     Scheduled Medications: . atorvastatin  20 mg Oral Daily  . Chlorhexidine Gluconate Cloth  6 each Topical Daily  . insulin aspart  0-6 Units Subcutaneous Q4H  . pantoprazole  40 mg Oral Daily  . sodium chloride flush  3 mL Intravenous Q12H    Infusions: . sodium chloride     . amiodarone 60 mg/hr (10/14/20 [id number])  . furosemide (LASIX) 200 mg in dextrose 5% 100 mL ('2mg'$ /mL) infusion 30 mg/hr (10/14/20 0220)  . heparin 1,400 Units/hr (10/13/20 1542)  . milrinone 0.25 mcg/kg/min (10/14/20 0800)    PRN Medications: sodium chloride, acetaminophen, albuterol, dextrose, ondansetron (ZOFRAN) IV, sodium chloride flush, sodium chloride flush  Assessment/Plan   1. A Flutter RVR - Started on amiodarone drip. Remains tachycardic with rates > 110-130 - Failed attempt at DCCV 3/18 - Continue IV amiodarone load, repeat DCCV this week after diuresis  - TSH normal - Continue IV heparin  2. 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  - Echo this admit w/ severe biventricular HF with EF < 15%.  BNP > 4000 . Suspect tachy-mediated. HS Trop - no trend. No chest pain. No LHC for now. Could consider ischemic work up later if renal function improves. Susepct he may need RHC if not responding to diuresis. - PICC placed initial Co-ox 40%. Started Milrinone 0.25 - Co-ox 62% today  - Remains markedly volume overloaded, refractory to high dose lasix gtt + rising SCr - Obtain Renal consult. May need UF for fluid removal  - May need RHC to better assess filling pressures/ output   - Continue Lasix gtt at 30/hr for now. Hold metolazone for today  - No bb w/ low output  - No arb/dig/spiro with AKI  - c/w unna boots.  - Fluid restrict 1500 cc  3. AKI on CKD Stage IIIa - Creatinine on admit 2.8.  Most recent creatinine was 1.55 back in 03/2020. - started on milrinone for low output - Creatinine trending up, now at 3.0  - Consult nephrology  - Consider [place]  4. DMII - Hgb A1C 5.9  - On SSI  5. Lower Extremity Edema - Dopplers negative for DVT - 2/2 CHF Continue diuretics + unna boots   6. 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  -Continue PPI  7. HTN  - Pressures soft but stable - monitor w/ afib and w/ diuresis   8. Hypervolemic  Hyponatremia - Na 133 - Continue diuretics, may need HD for UF    Length of Stay: [address] PA-C  10/14/2020, 11:12 AM  Advanced Heart Failure Team Pager [phone] (M-F; 7a - 5p)  Please contact [facility] for night-coverage after hours (4p -7a ) and weekends on [email or web address]  Patient seen and examined with the above-signed Advanced Practice Provider and/or Housestaff. I personally reviewed laboratory data, imaging studies and relevant notes. I independently examined the patient and formulated the important aspects of the plan. I have edited the note to reflect any of my changes or salient points. I have personally discussed the plan with the patient and/or family.  Remains on milrinone, high-dose lasix gtt and  IV amio. Remains in AF with RVR though rate down some. Urine output has picked up but still pretty modest and he remains markedly volume overloaded. Creatinine now > 3  General:  Lying in bed No resp difficulty HEENT: normal Neck: supple. JVP to ear. Carotids 2+ bilat; no bruits. No lymphadenopathy or thryomegaly appreciated. Cor: PMI nondisplaced. Irregular tachy  No rubs, gallops or murmurs. Lungs: clear Abdomen:  Obese soft, nontender, + distended. No hepatosplenomegaly. No bruits or masses. Good bowel sounds. Extremities: no cyanosis, clubbing, rash, 3+ edema Neuro: alert & orientedx3, cranial nerves grossly intact. moves all 4 extremities w/o difficulty. Affect pleasant  He remains quite tenuous. Will continue attempts at aggressive diuresis. Renal has seen (thank you). And agrees with trial of UF as needed. We will reassess in am.   Glori Bickers, MD  4:28 PM

## 2020-10-14 NOTE — Progress Notes (Signed)
[place] for Xarelto>Heparin Indication: atrial fibrillation  No Known Allergies  Patient Measurements: Height: '5\' 6"'$  (167.6 cm) Weight: 116.4 kg (256 lb 9.9 oz) IBW/kg (Calculated) : 63.8 Heparin Dosing Weight: 89.3 kg  Vital Signs: Temp: 98 F (36.7 C) (03/22 0400) Temp Source: Oral (03/22 0400) BP: 128/68 (03/22 0844) Pulse Rate: 67 (03/22 0400)  Labs: Recent Labs    10/12/20 0500 10/13/20 0500 10/13/20 1000 10/14/20 0406  HGB 10.4* 10.1*  --  11.0*  HCT 31.8* 30.2*  --  31.7*  PLT 131* 126*  --  155  HEPARINUNFRC 0.48 0.24* 0.32 0.42  CREATININE 2.59* 2.82*  --  3.02*    Estimated Creatinine Clearance: 24.6 mL/min (A) (by C-G formula based on SCr of 3.02 mg/dL (H)).   Assessment: 80 yo M with new Afib with RVR. No AC noted PTA. Was started on heparin then switched to Xarelto 3/16 and received 1 dose. Pharmacy asked to switch back to IV heparin. Patient now s/p unsuccessful DCCV. Plans to try again this week once diuresed.  Heparin level remains therapeutic at 0.42, on drip rate 1400 units/hr. CBC improving, Hgb 11, pltc 155. No overt bleeding or infusion issues noted.   Goal of Therapy:  Heparin level 0.3-0.7 units/ml Monitor platelets by anticoagulation protocol: Yes   Plan:  Continue heparin at 1400 units/hr Monitor daily HL, CBC, s/sx bleeding  Richardine Service, PharmD, BCPS PGY2 Cardiology Pharmacy Resident Phone: [phone] 10/14/2020  10:35 AM  Please check [email or web address] for unit-specific pharmacy phone numbers.

## 2020-10-15 ENCOUNTER — Encounter (HOSPITAL_COMMUNITY): Payer: Self-pay | Admitting: Cardiology

## 2020-10-15 DIAGNOSIS — I5021 Acute systolic (congestive) heart failure: Secondary | ICD-10-CM | POA: Diagnosis not present

## 2020-10-15 DIAGNOSIS — N179 Acute kidney failure, unspecified: Secondary | ICD-10-CM | POA: Diagnosis not present

## 2020-10-15 DIAGNOSIS — I10 Essential (primary) hypertension: Secondary | ICD-10-CM | POA: Diagnosis not present

## 2020-10-15 DIAGNOSIS — I4819 Other persistent atrial fibrillation: Secondary | ICD-10-CM | POA: Diagnosis not present

## 2020-10-15 DIAGNOSIS — N189 Chronic kidney disease, unspecified: Secondary | ICD-10-CM | POA: Diagnosis not present

## 2020-10-15 LAB — BASIC METABOLIC PANEL
Anion gap: 11 (ref 5–15)
Anion gap: 12 (ref 5–15)
BUN: 54 mg/dL — ABNORMAL HIGH (ref 8–23)
BUN: 54 mg/dL — ABNORMAL HIGH (ref 8–23)
CO2: 21 mmol/L — ABNORMAL LOW (ref 22–32)
CO2: 21 mmol/L — ABNORMAL LOW (ref 22–32)
Calcium: 8.1 mg/dL — ABNORMAL LOW (ref 8.9–10.3)
Calcium: 8.2 mg/dL — ABNORMAL LOW (ref 8.9–10.3)
Chloride: 100 mmol/L (ref 98–111)
Chloride: 101 mmol/L (ref 98–111)
Creatinine, Ser: 3.25 mg/dL — ABNORMAL HIGH (ref 0.61–1.24)
Creatinine, Ser: 3.35 mg/dL — ABNORMAL HIGH (ref 0.61–1.24)
GFR, Estimated: 19 mL/min — ABNORMAL LOW (ref >60)
GFR, Estimated: 18 mL/min — ABNORMAL LOW (ref >60)
Glucose, Bld: 128 mg/dL — ABNORMAL HIGH (ref 70–99)
Glucose, Bld: 152 mg/dL — ABNORMAL HIGH (ref 70–99)
Potassium: 3.4 mmol/L — ABNORMAL LOW (ref 3.5–5.1)
Potassium: 3.8 mmol/L (ref 3.5–5.1)
Sodium: 132 mmol/L — ABNORMAL LOW (ref 135–145)
Sodium: 134 mmol/L — ABNORMAL LOW (ref 135–145)

## 2020-10-15 LAB — GLUCOSE, CAPILLARY
Glucose-Capillary: 122 mg/dL — ABNORMAL HIGH (ref 70–99)
Glucose-Capillary: 123 mg/dL — ABNORMAL HIGH (ref 70–99)
Glucose-Capillary: 128 mg/dL — ABNORMAL HIGH (ref 70–99)
Glucose-Capillary: 145 mg/dL — ABNORMAL HIGH (ref 70–99)
Glucose-Capillary: 150 mg/dL — ABNORMAL HIGH (ref 70–99)
Glucose-Capillary: 156 mg/dL — ABNORMAL HIGH (ref 70–99)

## 2020-10-15 LAB — CBC
HCT: 30.2 % — ABNORMAL LOW (ref 39.0–52.0)
Hemoglobin: 10.3 g/dL — ABNORMAL LOW (ref 13.0–17.0)
MCH: 28.3 pg (ref 26.0–34.0)
MCHC: 34.1 g/dL (ref 30.0–36.0)
MCV: 83 fL (ref 80.0–100.0)
Platelets: 145 10*3/uL — ABNORMAL LOW (ref 150–400)
RBC: 3.64 MIL/uL — ABNORMAL LOW (ref 4.22–5.81)
RDW: 19 % — ABNORMAL HIGH (ref 11.5–15.5)
WBC: 5.2 10*3/uL (ref 4.0–10.5)
nRBC: 0 % (ref 0.0–0.2)

## 2020-10-15 LAB — HEPARIN LEVEL (UNFRACTIONATED): Heparin Unfractionated: 0.35 IU/mL (ref 0.30–0.70)

## 2020-10-15 LAB — COOXEMETRY PANEL
Carboxyhemoglobin: 1 % (ref 0.5–1.5)
Methemoglobin: 1 % (ref 0.0–1.5)
O2 Saturation: 64.5 %
Total hemoglobin: 10.5 g/dL — ABNORMAL LOW (ref 12.0–16.0)

## 2020-10-15 MED ORDER — METOLAZONE 2.5 MG PO TABS
2.5000 mg | ORAL_TABLET | Freq: Once | ORAL | Status: AC
  Administered 2020-10-15: 2.5 mg via ORAL
  Filled 2020-10-15: qty 1

## 2020-10-15 MED ORDER — HYDROCORTISONE 1 % EX CREA
1.0000 "application " | TOPICAL_CREAM | Freq: Three times a day (TID) | CUTANEOUS | Status: DC | PRN
  Filled 2020-10-15: qty 28

## 2020-10-15 MED ORDER — AMIODARONE LOAD VIA INFUSION
150.0000 mg | Freq: Once | INTRAVENOUS | Status: AC
  Administered 2020-10-15: 150 mg via INTRAVENOUS
  Filled 2020-10-15: qty 83.34

## 2020-10-15 MED ORDER — POTASSIUM CHLORIDE CRYS ER 20 MEQ PO TBCR
20.0000 meq | EXTENDED_RELEASE_TABLET | Freq: Once | ORAL | Status: AC
  Administered 2020-10-15: 20 meq via ORAL
  Filled 2020-10-15: qty 1

## 2020-10-15 NOTE — Progress Notes (Signed)
[place] for Xarelto>Heparin Indication: atrial fibrillation  No Known Allergies  Patient Measurements: Height: '5\' 6"'$  (167.6 cm) Weight: 114.8 kg (253 lb 1.6 oz) IBW/kg (Calculated) : 63.8 Heparin Dosing Weight: 89.3 kg  Vital Signs: Temp: 98.5 F (36.9 C) (03/23 0757) Temp Source: Oral (03/23 0757) BP: 112/68 (03/23 0757) Pulse Rate: 93 (03/23 0757)  Labs: Recent Labs    10/13/20 0500 10/13/20 1000 10/14/20 0406 10/15/20 0431  HGB 10.1*  --  11.0* 10.3*  HCT 30.2*  --  31.7* 30.2*  PLT 126*  --  155 145*  HEPARINUNFRC 0.24* 0.32 0.42 0.35  CREATININE 2.82*  --  3.02* 3.25*    Estimated Creatinine Clearance: 22.7 mL/min (A) (by C-G formula based on SCr of 3.25 mg/dL (H)).   Assessment: 80 yo M with new Afib with RVR. No AC noted PTA. Was started on heparin then switched to Xarelto 3/16 and received 1 dose. Pharmacy asked to switch back to IV heparin. Patient now s/p unsuccessful DCCV. Plans to try again this week if adequately diuresed.  Heparin level remains therapeutic at 0.35, on drip rate 1400 units/hr. CBC down slightly, Hgb 10.3, pltc 145. No overt bleeding or infusion issues noted.   Goal of Therapy:  Heparin level 0.3-0.7 units/ml Monitor platelets by anticoagulation protocol: Yes   Plan:  Continue heparin at 1400 units/hr Monitor daily HL, CBC, s/sx bleeding  Richardine Service, PharmD, BCPS PGY2 Cardiology Pharmacy Resident Phone: [phone] 10/15/2020  9:43 AM  Please check [email or web address] for unit-specific pharmacy phone numbers.

## 2020-10-15 NOTE — Progress Notes (Signed)
Noted pt to have consistent HR of 125-140 a-fib. Pt is asymptomatic. Call placed to Dr. Myna Hidalgo and rec'd orders to give bolus of 150 mg. Bolus completed. Noted in [facility] that Amiodarone to be given at 60 mg/hr; however, noted that dosage to be dropped to 30 mg/hr after 6 hrs completed. Msg'd Dr. Myna Hidalgo again to clarify order. Dr. Myna Hidalgo telephoned and stated to keep at 60 mg/hr until AM and have cards determine dosage. CN notified and asked RN to notify cards tonight for clarification as the patient has been on 60 mg/hr for sometime now. Note sent to cardiology fellow for clarification. Awaiting on response. Will continue to follow and monitor patient closely. Currently pt is in a-fib at 127 bpm. Pt has consistently denied symptoms throughout night. States that he "feels fine".

## 2020-10-15 NOTE — Progress Notes (Signed)
   10/14/20 1940  Assess: MEWS Score  Temp 98.5 F (36.9 C)  BP 115/70  Pulse Rate (!) 110  ECG Heart Rate (!) 115  Resp 19  Level of Consciousness Alert  SpO2 96 %  O2 Device Room Air  Assess: MEWS Score  MEWS Temp 0  MEWS Systolic 0  MEWS Pulse 2  MEWS RR 0  MEWS LOC 0  MEWS Score 2  MEWS Score Color Yellow  Assess: if the MEWS score is Yellow or Red  Were vital signs taken at a resting state? Yes  Focused Assessment No change from prior assessment  Early Detection of Sepsis Score *See Row Information* Low  MEWS guidelines implemented *See Row Information* No, previously yellow, continue vital signs every 4 hours  Treat  Pain Scale 0-10  Pain Score 0  Notify: Charge Nurse/RN  Name of Charge Nurse/RN Notified [place] RN  Date Charge Nurse/RN Notified 10/14/20  Time Charge Nurse/RN Notified 1955  Document  Patient Outcome  (Stable and remains on unit)  Progress note created (see row info) Yes

## 2020-10-15 NOTE — Progress Notes (Signed)
[facility] Progress Note    Assessment/ Plan:   Assessment/Recommendations: Kyle Hicks is a/an 80 y.o. 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 Fib/A. Flutter, CKD Stage 3a, T2DM, HTN, obesity.  1. Non-Oliguric AKI: Likely a combination of hypoperfusion secondary to tachycardia and vascular congestion secondary to cardiorenal syndrome. No evidence of uremic syndrome. -Diuretics per Heart Failure team. Okay to restart Metolazone from a nephrology standpoint -Will coordinate with HF team should diuretics fail. Patient may benefit from short course of RRT.  -Continue to monitor daily Cr, Dose meds for GFR<15 -Monitor Daily I/Os, Daily weight  -Maintain MAP>65 for optimal renal perfusion.  -Agree with holding ACE-I, avoid further nephrotoxins including NSAIDS, Morphine. Unless absolutely necessary, avoid CT with contrast and/or MRI with gadolinium.  2. 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  UACR initially normal in 2018; markedly elevated in 2021 at 612. Additional differential includes obesity related GN.  Baseline creatinine was previously 1.2 - 1.6.  -Referral to Nephrology on discharge recommended -Urine studies once stabilized or in the outpatient basis  3. Acute Biventricular Heart Failure: Previous hx of HFpEF with progression to HFrEF with EF of 10-15%; suspected to be in the setting of A. Fib with RVR. Significantly volume overloaded with difficulty diuresing. He is currently on Lasix gtt and Milrinone with Metolazane being held.  -HF following  4. A. Fib with RVR: Failed DCCV on 3/18. Plan to repeat next week after diuresis. Currently on Amiodarone.   5. Type 2 Diabetes Mellitus: Per chart review, A1c mostly below 7% with the exception of 1-2 readings since 2014. Follows with Dr. Gae Hicks  (Endocrinology). Current medication management prior to admission: Glipizide and Sitagliptin-Metformin.  -SSI (resistant)   6. Hx of HTN: Previously on HCTZ, Nifedipine and Ramipril. Currently all three are being held. BP has been soft during this admission. 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 At the time, patient refused further evaluation. LFTs WNL on admission. No obvious sequela of cirrhosis on examination. May benefit from repeat imaging.   8. HLD: Managed with Atoravastatin  Subjective:   Kyle Hicks denies any acute complaints at this time. He feels as though his swelling may have improved marginally. He is urinating often. He denies any nausea, vomiting, lack of appetite, SOB, CP, dysgeusia.    Objective:   BP 112/68 (BP Location: Left Arm)   Pulse 93   Temp 98.5 F (36.9 C) (Oral)   Resp (!) 21   Ht '5\' 6"'$  (1.676 m)   Wt 114.8 kg   SpO2 96%   BMI 40.85 kg/m   Intake/Output Summary (Last 24 hours) at 10/15/2020 0950 Last data filed at 10/15/2020 0759 Gross per 24 hour  Intake 480 ml  Output 2150 ml  Net -1670 ml   Weight change: -1.595 kg  Physical Exam: [id number] elderly gentleman, appears stated age. No acute distress [id number] with rates in the 120s, irregularly irregular. No murmurs Resp:Anterior lung fields auscultated only. No wheezing.  Abd:Non-tender to palpation Ext:2+ pitting edema of the lower extremities up the the superior thighs, trace pitting edema of the upper extremities.   Imaging: US RENAL  Result Date: 10/14/2020 CLINICAL DATA:  Acute renal insufficiency EXAM: RENAL / URINARY TRACT ULTRASOUND COMPLETE COMPARISON:  06/22/2017 FINDINGS: Right Kidney: Renal measurements:  11.1 x 5.4 x 6.2 cm = volume: 194 mL. Normal cortical thickness for age. Slightly increased echogenicity. Left Kidney: Renal measurements: 9.6 x 5.6 x 4.4 cm = volume: 124 mL. Renal cortical thinning.  Lower pole 3.5 cm cyst. Bladder: Appears normal for degree of bladder distention. Other: Right-sided pleural effusion incidentally noted. Decreased study quality secondary to patient body habitus. IMPRESSION: 1.  No hydronephrosis. 2. Left renal cortical thinning with increased right renal cortical echogenicity, suggesting medical renal disease. 3. Right pleural effusion. 4. Decreased sensitivity and specificity exam due to technique/patient related factors, as described above. Electronically Signed   By: Kyle Hicks M.D.   On: 10/14/2020 18:08   Labs: BMET Recent Labs  Lab 10/09/20 0404 10/10/20 0707 10/11/20 0432 10/12/20 0500 10/13/20 0500 10/14/20 0406 10/15/20 0431  NA 137 134* 136 134* 134* 133* 132*  K 4.5 3.6 4.4 4.1 3.8 3.8 3.4*  CL 113* 106 107 105 105 102 100  CO2 13* 17* 18* 19* 20* 20* 21*  GLUCOSE 105* 228* 151* 141* 124* 134* 128*  BUN 48* 45* 46* 47* 48* 50* 54*  CREATININE 2.51* 2.44* 2.63* 2.59* 2.82* 3.02* 3.25*  CALCIUM 8.1* 7.8* 8.2* 8.1* 8.1* 8.4* 8.1*   CBC Recent Labs  Lab 10/12/20 0500 10/13/20 0500 10/14/20 0406 10/15/20 0431  WBC 5.3 5.3 5.9 5.2  HGB 10.4* 10.1* 11.0* 10.3*  HCT 31.8* 30.2* 31.7* 30.2*  MCV 85.9 84.6 83.4 83.0  PLT 131* 126* 155 145*    Medications:    . atorvastatin  20 mg Oral Daily  . Chlorhexidine Gluconate Cloth  6 each Topical Daily  . insulin aspart  0-6 Units Subcutaneous Q4H  . pantoprazole  40 mg Oral Daily  . sodium chloride flush  3 mL Intravenous Q12H   Dr. Jose Hicks Internal Medicine PGY-2  10/15/2020, 9:50 AM

## 2020-10-15 NOTE — Progress Notes (Incomplete)
Noted pt to have consistent HR of 125-140 a-fib. Pt is asymptomatic. Call placed to Dr. Myna Hidalgo and rec'd orders to give bolus of 150 mg. Bolus completed. Noted in [facility] that Amiodarone to be given at 60 mg/hr; however, noted that dosage to be dropped to 30 mg/hr after 6 hrs completed. Msg'd Dr. Myna Hidalgo again to clarify order. Dr. Myna Hidalgo telephoned and stated to keep at 60 mg/hr until AM and have cards determine dosage. CN notified and asked RN to notify cards tonight for clarification as the patient has been on 60 mg/hr for sometime now. Note sent to cardiology fellow for clarification. Awaiting on response. Will continue to follow and monitor patient closely. Currently pt is in a-fib at 127 bpm. Pt has consistently denied symptoms throughout night. States that he "feels fine".

## 2020-10-15 NOTE — TOC Progression Note (Signed)
Transition of Care (TOC) - Progression Note  Heart Failure   Patient Details  Name: Kyle Hicks MRN: [id number] Date of Birth: 03/27/1941  Transition of Care [facility]) CM/SW [place], [place] Phone Number: 10/15/2020, 3:37 PM  Clinical Narrative:    CSW reached out to the patients wife Mrs. Alaa Omari to check and see if she still wanted CSW to arrange for transportation for when [name] is ready for discharge. However, CSW was unable to get in touch with Mrs. Tristan and left a voicemail for her to return the call.  TOC/CSW will continue to follow through discharge.    Expected Discharge Plan: [place] Barriers to Discharge: Continued Medical Work up  Expected Discharge Plan and Services Expected Discharge Plan: [place] In-house Referral: Clinical Social Work Discharge Planning Services: CM Consult Post Acute Care Choice: [place] arrangements for the past 2 months: Single Family Home                 DME Arranged: N/A (Pt. reports none needed) DME Agency: NA       HH Arranged: PT,RN [place]: [facility] [facility] [employer] Date [place]: 10/10/20 Time [place]: 1150 Representative spoke with at [place]: [place]   Social Determinants of Health ([place]) Interventions    Readmission Risk Interventions No flowsheet data found.  Cortlin Polo, MSW, [place] Heart Failure Social Worker

## 2020-10-15 NOTE — Progress Notes (Signed)
Occupational Therapy Treatment Patient Details Name: Kyle Hicks MRN: [id number] DOB: 1941-06-29 Today's Date: 10/15/2020    History of present illness Pt is a 80 y.o. male admitted 10/07/20 with c/o diarrhea, weakness and fall at home. CXR with enlarged cardiac sillhouette, small R-side pleural effusion. Pt also with tachycardia, HF, AKI. Pt with aflutter RVR 3/17. S/p TEE cardioversion 3/18 showing severe biventricular dysfunction, severe TR, mod-severe MR; DCCV unsuccessful. Course complicated by non-oliguric AKI, likely combo of hypoperfusion secondary to tachycardia and vascular congestion; for renal ultrasound 3/22. PMH includes HTN, CHF, DM2, CKD IIIa, PUD, GIB.   OT comments  Pt making steady progress towards OT goals this session. Pt received seated on BSC with pt needing total A for posterior pericare, MINA for stand pivot transfer to EOB with RW. Pt able to complete x3 sit<>stands from EOB with minguard assist. Pt continues to present with impaired balance, tachycardia, and generalized deconditioning with HR increasing to as much as 147 bpm with minimal activity. Pt would continue to benefit from skilled occupational therapy while admitted and after d/c to address the below listed limitations in order to improve overall functional mobility and facilitate independence with BADL participation. DC plan remains appropriate, will follow acutely per POC.     Follow Up Recommendations  Home health OT;Supervision/Assistance - 24 hour;Other (comment) (initially)    Equipment Recommendations  3 in 1 bedside commode;Other (comment) (already in room)    Recommendations for Other Services      Precautions / Restrictions Precautions Precautions: Fall;Other (comment) Precaution Comments: Watch HR (Dr. Haroldine Laws says HR in 140s-150s ok) Restrictions Weight Bearing Restrictions: No       Mobility Bed Mobility Overal bed mobility: Needs Assistance Bed Mobility: Sit to Supine       Sit  to supine: Min assist   General bed mobility comments: MIN A to return BLEs to EOB    Transfers Overall transfer level: Needs assistance Equipment used: Rolling walker (2 wheeled) Transfers: Sit to/from Stand Sit to Stand: Min guard;Min assist         General transfer comment: MINA  to rise from [facility], cues for hand placement each trial, MIN guard as session progressed from EOB with pt able to complete x3 sit<>stands, MIN A for SPT from BSC>EOB with RW, MINA for steadying assist and line mgmt as well as safety awareness    Balance   Sitting-balance support: No upper extremity supported;Feet supported Sitting balance-[name] Scale: Fair     Standing balance support: Bilateral upper extremity supported Standing balance-[name] Scale: Poor Standing balance comment: reliant on BUE support                           ADL either performed or assessed with clinical judgement   ADL Overall ADL's : Needs assistance/impaired             Lower Body Bathing: Total assistance;Sit to/from stand Lower Body Bathing Details (indicate cue type and reason): simulated via posterior pericare         Toilet Transfer: Minimal assistance;RW;BSC;Stand-pivot Toilet Transfer Details (indicate cue type and reason): bari BSC, MIN A for SPT from BSC>EOB with Rw, assist needed to initially rise Toileting- Clothing Manipulation and Hygiene: Total assistance;Sit to/from stand Toileting - Clothing Manipulation Details (indicate cue type and reason): Pt in standing, OT performed rear peri care     Functional mobility during ADLs: Minimal assistance;Rolling walker General ADL Comments: pt continues to present with impaired  balance and tachycardia with minimal exertion     Vision       Perception     Praxis      Cognition Arousal/Alertness: Awake/alert Behavior During Therapy: Flat affect Overall Cognitive Status: No family/caregiver present to determine baseline cognitive  functioning Area of Impairment: Following commands;Problem solving;Awareness;Attention                   Current Attention Level: Selective   Following Commands: Follows one step commands consistently;Follows one step commands with increased time   Awareness: Intellectual Problem Solving: Slow processing;Requires verbal cues General Comments: slow to process likely d/t [employer]        Exercises     Shoulder Instructions       General Comments HR increase to as muchas 147 bpm with minimal exertion    Pertinent Vitals/ Pain       Pain Assessment: No/denies pain  Home Living                                          Prior Functioning/Environment              Frequency  Min 2X/week        Progress Toward Goals  OT Goals(current goals can now be found in the care plan section)  Progress towards OT goals: Progressing toward goals  Acute Rehab OT Goals Patient Stated Goal: To improve strength and return to independence Time For Goal Achievement: 10/25/20 Potential to Achieve Goals: Good  Plan Discharge plan remains appropriate;Frequency remains appropriate    Co-evaluation                 AM-PAC OT "6 Clicks" Daily Activity     Outcome Measure   Help from another person eating meals?: None Help from another person taking care of personal grooming?: A Little Help from another person toileting, which includes using toliet, bedpan, or urinal?: A Lot Help from another person bathing (including washing, rinsing, drying)?: A Lot Help from another person to put on and taking off regular upper body clothing?: A Little Help from another person to put on and taking off regular lower body clothing?: Total 6 Click Score: 15    End of Session Equipment Utilized During Treatment: Rolling walker  OT Visit Diagnosis: Unsteadiness on feet (R26.81);Other abnormalities of gait and mobility (R26.89);Muscle weakness (generalized) (M62.81);Other  symptoms and signs involving cognitive function   Activity Tolerance Patient tolerated treatment well   Patient Left in bed;with call bell/phone within reach;with bed alarm set   Nurse Communication Mobility status        Time: [id number] OT Time Calculation (min): 17 min  Charges: OT General Charges $OT Visit: 1 Visit OT Treatments $Self Care/Home Management : 8-22 mins  Harley Alto., COTA/L Acute Rehabilitation Services [phone]    Kyle Hicks 10/15/2020, 4:53 PM

## 2020-10-15 NOTE — Plan of Care (Signed)
  Problem: Cardiac: Goal: Ability to achieve and maintain adequate cardiopulmonary perfusion will improve Outcome: Progressing   Problem: Clinical Measurements: Goal: Ability to maintain clinical measurements within normal limits will improve Outcome: Progressing Goal: Will remain free from infection Outcome: Progressing Goal: Diagnostic test results will improve Outcome: Progressing Goal: Respiratory complications will improve Outcome: Progressing Goal: Cardiovascular complication will be avoided Outcome: Progressing

## 2020-10-15 NOTE — Progress Notes (Signed)
PROGRESS NOTE    Kyle Hicks  [id number] DOB: 26-Apr-1941 DOA: 10/07/2020 PCP: Sharilyn Sites, MD    Brief Narrative:  Kyle Hicks was admitted to the hospital working diagnosis of acute on chronic systolic heart failure decompensation.  80 year old male past medical history for hypertension, heart failure, type 2 diabetes mellitus and history of GI bleed who presented after mechanical fall.  On the day of admission he got up to use the restroom, fell a week and sustained a mechanical fall.  No head trauma or loss of consciousness.  Reports lower extremity edema, dyspnea, wheezing or palpitations.  Positive diarrhea.  On his initial physical examination heart rate 129-134, respiratory rate 19-43, blood pressure 105/73-130 8/108, oxygen saturation 98% on supplemental oxygen.  His lungs had no wheezing or rales, heart S1-S2, present, rhythmic, tachycardic, abdomen soft, positive lower extremity edema.  Chest radiograph with cardiomegaly, positive hilar vascular congestion, small right pleural effusion.  EKG 133 bpm, left axis deviation, Q waves lead II, lead III, aVF, prolonged QTc 545, right bundle branch block, atrial flutter, no ST segment changes, negative T wave V1-V3.  Assessment & Plan:   Principal Problem:   Acute CHF (congestive heart failure) (HCC) Active Problems:   Acute kidney injury superimposed on chronic kidney disease (HCC)   Essential hypertension   Elevated d-dimer   Obesity (BMI 30-39.9)   Diarrhea   Atrial flutter (HCC)   Atrial fibrillation ([place])   1. Acute on chronic systolic heart failure/ biventricular failure with low output/ acute core pulmonale. Urine output over last 24 hrs is 2,400 cc, blood pressure systolic is high 90 to low 100's, has unna boots in place. Serum cr trending up to 3,25 today.  LV EF 10 to 15%, global hypokinesis, SV 41 RV systolic severely reduced, RVSP 49,1 mmHg.  Bi-atrial enlargement, severe TR.   Continue medical management with  milrinone and furosemide drip.  Rate control with amiodarone.   2. Atrial flutter with RVR/ prolonged QTc. Sp unsuccessful cardioversion 03/18.  Continue uncontrolled HR, continue with amiodarone and anticoagulation with heparin drip.   3. 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 Renal function with serum cr at 3,25 with K at 3.4 and serum Na at 132,  Continue furosemide IV for hypervolemia, follow up on renal function in am.   4. T2DM with dyslipidemia fasting glucose today 128 mg/dl, patient is tolerating po well, continue insulin sliding scale for glucose cover and monitoring.   Continue with atorvastatin   5. Obesity class 3. Calculated BMI is 40,0  6. HTN. Holding antihypertensive medications, in the setting acute decompensated heart failure with low cardiac output.     Patient continue to be at high risk for worsening renal and heart failure   Status is: Inpatient  Remains inpatient appropriate because:IV treatments appropriate due to intensity of illness or inability to take PO   Dispo: The patient is from: Home              Anticipated d/c is to: Home              Patient currently is not medically stable to d/c.   Difficult to place patient No   DVT prophylaxis: Heparin   Code Status:   full  Family Communication:  I spoke with patient's wife at the bedside, we talked in detail about patient's condition, plan of care and prognosis and all questions were addressed.      Nutrition Status:  Skin Documentation:     Consultants:   Cardiology   Nephrology     Subjective: Patient with no dyspnea or chest pain at rest, continue to have lower extremity edema, no nausea or vomiting and tolerating po well.   Objective: Vitals:   10/15/20 0400 10/15/20 0602 10/15/20 0757 10/15/20 1152  BP: (!) 114/102  112/68 107/75  Pulse: (!) 107  93 64  Resp: (!) 23  (!) 21 (!) 21  Temp: 98.5 F (36.9 C)  98.5 F (36.9 C) 97.7 F (36.5 C)  TempSrc: Oral  Oral Oral   SpO2: 96%  96% 100%  Weight:  114.8 kg    Height:        Intake/Output Summary (Last 24 hours) at 10/15/2020 1245 Last data filed at 10/15/2020 0759 Gross per 24 hour  Intake 240 ml  Output 2150 ml  Net -1910 ml   Filed Weights   10/13/20 0150 10/14/20 0428 10/15/20 0602  Weight: 119.6 kg 116.4 kg 114.8 kg    Examination:   General: Not in pain or dyspnea, deconditioned and ill looking appearing. Neurology: Awake and alert, non focal  E ENT: mild pallor, no icterus, oral mucosa moist Cardiovascular: No JVD. S1-S2 present, rhythmic, no gallops, rubs, or murmurs. +++ pitting lower extremity edema, bilateral, unna boots in place. Pulmonary: positive breath sounds bilaterally,  no wheezing, rhonchi or rales. Decreased ventilation  Gastrointestinal. Abdomen protuberant. Soft and non tender Skin. No rashes Musculoskeletal: no joint deformities     Data Reviewed: I have personally reviewed following labs and imaging studies  CBC: Recent Labs  Lab 10/11/20 0432 10/12/20 0500 10/13/20 0500 10/14/20 0406 10/15/20 0431  WBC 6.2 5.3 5.3 5.9 5.2  HGB 11.2* 10.4* 10.1* 11.0* 10.3*  HCT 33.2* 31.8* 30.2* 31.7* 30.2*  MCV 84.5 85.9 84.6 83.4 83.0  PLT 155 131* 126* 155 Q000111Q*   Basic Metabolic Panel: Recent Labs  Lab 10/11/20 0432 10/12/20 0500 10/13/20 0500 10/14/20 0406 10/15/20 0431  NA 136 134* 134* 133* 132*  K 4.4 4.1 3.8 3.8 3.4*  CL 107 105 105 102 100  CO2 18* 19* 20* 20* 21*  GLUCOSE 151* 141* 124* 134* 128*  BUN 46* 47* 48* 50* 54*  CREATININE 2.63* 2.59* 2.82* 3.02* 3.25*  CALCIUM 8.2* 8.1* 8.1* 8.4* 8.1*   GFR: Estimated Creatinine Clearance: 22.7 mL/min (A) (by C-G formula based on SCr of 3.25 mg/dL (H)). Liver Function Tests: No results for input(s): AST, ALT, ALKPHOS, BILITOT, PROT, ALBUMIN in the last 168 hours. No results for input(s): LIPASE, AMYLASE in the last 168 hours. No results for input(s): AMMONIA in the last 168 hours. Coagulation  Profile: No results for input(s): INR, PROTIME in the last 168 hours. Cardiac Enzymes: No results for input(s): CKTOTAL, CKMB, CKMBINDEX, TROPONINI in the last 168 hours. BNP (last 3 results) No results for input(s): PROBNP in the last 8760 hours. HbA1C: No results for input(s): HGBA1C in the last 72 hours. CBG: Recent Labs  Lab 10/14/20 2036 10/15/20 0007 10/15/20 0358 10/15/20 0757 10/15/20 1230  GLUCAP 180* 122* 128* 150* 145*   Lipid Profile: No results for input(s): CHOL, HDL, LDLCALC, TRIG, CHOLHDL, LDLDIRECT in the last 72 hours. Thyroid Function Tests: No results for input(s): TSH, T4TOTAL, FREET4, T3FREE, THYROIDAB in the last 72 hours. Anemia Panel: No results for input(s): VITAMINB12, FOLATE, FERRITIN, TIBC, IRON, RETICCTPCT in the last 72 hours.    Radiology Studies: I have reviewed all of the imaging during this hospital visit personally  Scheduled Meds: . atorvastatin  20 mg Oral Daily  . Chlorhexidine Gluconate Cloth  6 each Topical Daily  . insulin aspart  0-6 Units Subcutaneous Q4H  . pantoprazole  40 mg Oral Daily  . sodium chloride flush  3 mL Intravenous Q12H   Continuous Infusions: . sodium chloride    . amiodarone 60 mg/hr (10/15/20 0722)  . furosemide (LASIX) 200 mg in dextrose 5% 100 mL ('2mg'$ /mL) infusion 30 mg/hr (10/15/20 1024)  . heparin 1,400 Units/hr (10/15/20 0711)  . milrinone 0.25 mcg/kg/min (10/15/20 [id number])     LOS: 8 days        Hurley Sobel Gerome Apley, MD

## 2020-10-15 NOTE — Progress Notes (Addendum)
Advanced Heart Failure Rounding Note  PCP-Cardiologist: No primary care provider on file.    Patient Profile   80 y/o male with obesity and HTN. Admitted with acute HF in setting of AF with RVR of unknown duration.   Echo LVEF 10-15%. RV severely reduced. PICC placed with initial co-ox 40% suggesting markedly low output. CVP up  Subjective:    TEE 3/18 w/ severe Biv dysfunction, Mod-severe MR, severe TR. No LAA thrombus. DCCV unsuccessful   Remains on Amio gtt at 60/hr. Still in Afib w/ RVR 110s at rest>>130s when up. SBP low 100s.   On Milrinone 0.25. Co-ox 65%    Refractory marked fluid overload despite high dose lasix gtt, on 30/hr. Diuresing but suboptimal response. Only - 2.4L in UOP yesterday. CVP 16. SCr trending up. 2.59>2.82>3.02>>3.3. Nephrology now following.   Urine sodium normal at 116   OOB sitting up in chair. No complaints. O2 sats 97% on RA.   Objective:   Weight Range: 114.8 kg Body mass index is 40.85 kg/m.   Vital Signs:   Temp:  [97.4 F (36.3 C)-98.5 F (36.9 C)] 98.5 F (36.9 C) (03/23 0757) Pulse Rate:  [68-137] 93 (03/23 0757) Resp:  [15-23] 21 (03/23 0757) BP: (108-117)/(66-102) 112/68 (03/23 0757) SpO2:  [95 %-98 %] 96 % (03/23 0757) Weight:  [114.8 kg] 114.8 kg (03/23 0602) Last BM Date: 10/14/20  Weight change: Filed Weights   10/13/20 0150 10/14/20 0428 10/15/20 0602  Weight: 119.6 kg 116.4 kg 114.8 kg    Intake/Output:   Intake/Output Summary (Last 24 hours) at 10/15/2020 0936 Last data filed at 10/15/2020 0759 Gross per 24 hour  Intake 480 ml  Output 2150 ml  Net -1670 ml      Physical Exam    CVP 16 General: obese. OOB sitting up in chair No resp difficulty HEENT:dentition in poor repair, otherwise normal   Neck: supple. JVP to jaw. Carotids 2+ bilat; no bruits. No lymphadenopathy or thryomegaly appreciated. Cor: PMI nondisplaced. Irregularly irregular rhythm, tachy rate  Lungs: decreased BS at the bases, no  wheezing or rhonchi  Abdomen: obese soft, nontender, nondistended. No hepatosplenomegaly. No bruits or masses. Good bowel sounds. Extremities: no cyanosis, clubbing, rash, 3+ bilateral LEE up to thighs, bilateral unna boots Neuro: alert & orientedx3, cranial nerves grossly intact. moves all 4 extremities w/o difficulty. Affect pleasant  Telemetry   Afib w/ RVR 100s at rest, 130s w/ ambulation  Personally reviewed  Labs    CBC Recent Labs    10/14/20 0406 10/15/20 0431  WBC 5.9 5.2  HGB 11.0* 10.3*  HCT 31.7* 30.2*  MCV 83.4 83.0  PLT 155 Q000111Q*   Basic Metabolic Panel Recent Labs    10/14/20 0406 10/15/20 0431  NA 133* 132*  K 3.8 3.4*  CL 102 100  CO2 20* 21*  GLUCOSE 134* 128*  BUN 50* 54*  CREATININE 3.02* 3.25*  CALCIUM 8.4* 8.1*   Liver Function Tests No results for input(s): AST, ALT, ALKPHOS, BILITOT, PROT, ALBUMIN in the last 72 hours. No results for input(s): LIPASE, AMYLASE in the last 72 hours. Cardiac Enzymes No results for input(s): CKTOTAL, CKMB, CKMBINDEX, TROPONINI in the last 72 hours.  BNP: BNP (last 3 results) Recent Labs    10/07/20 0229  BNP 4,299.0*    ProBNP (last 3 results) No results for input(s): PROBNP in the last 8760 hours.   D-Dimer No results for input(s): DDIMER in the last 72 hours. Hemoglobin A1C No results for  input(s): HGBA1C in the last 72 hours. Fasting Lipid Panel No results for input(s): CHOL, HDL, LDLCALC, TRIG, CHOLHDL, LDLDIRECT in the last 72 hours. Thyroid Function Tests No results for input(s): TSH, T4TOTAL, T3FREE, THYROIDAB in the last 72 hours.  Invalid input(s): FREET3  Other results:   Imaging    US RENAL  Result Date: 10/14/2020 CLINICAL DATA:  Acute renal insufficiency EXAM: RENAL / URINARY TRACT ULTRASOUND COMPLETE COMPARISON:  06/22/2017 FINDINGS: Right Kidney: Renal measurements: 11.1 x 5.4 x 6.2 cm = volume: 194 mL. Normal cortical thickness for age. Slightly increased echogenicity. Left  Kidney: Renal measurements: 9.6 x 5.6 x 4.4 cm = volume: 124 mL. Renal cortical thinning. Lower pole 3.5 cm cyst. Bladder: Appears normal for degree of bladder distention. Other: Right-sided pleural effusion incidentally noted. Decreased study quality secondary to patient body habitus. IMPRESSION: 1.  No hydronephrosis. 2. Left renal cortical thinning with increased right renal cortical echogenicity, suggesting medical renal disease. 3. Right pleural effusion. 4. Decreased sensitivity and specificity exam due to technique/patient related factors, as described above. Electronically Signed   By: Abigail Miyamoto M.D.   On: 10/14/2020 18:08     Medications:     Scheduled Medications: . atorvastatin  20 mg Oral Daily  . Chlorhexidine Gluconate Cloth  6 each Topical Daily  . insulin aspart  0-6 Units Subcutaneous Q4H  . pantoprazole  40 mg Oral Daily  . sodium chloride flush  3 mL Intravenous Q12H    Infusions: . sodium chloride    . amiodarone 60 mg/hr (10/15/20 0722)  . furosemide (LASIX) 200 mg in dextrose 5% 100 mL ('2mg'$ /mL) infusion 30 mg/hr (10/15/20 0143)  . heparin 1,400 Units/hr (10/15/20 0711)  . milrinone 0.25 mcg/kg/min (10/15/20 0648)    PRN Medications: sodium chloride, acetaminophen, albuterol, dextrose, hydrocortisone cream, ondansetron (ZOFRAN) IV, sodium chloride flush, sodium chloride flush  Assessment/Plan   1. A Flutter RVR - Started on amiodarone drip. Remains tachycardic with rates > 110-130 - Failed attempt at DCCV 3/18 - Continue IV amiodarone load, repeat DCCV after better diuresed - TSH normal - Continue IV heparin  2. 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  - Echo this admit w/ severe biventricular HF with EF < 15%.  BNP > 4000 . Suspect tachy-mediated. HS Trop - no trend. No chest pain. No LHC for now. Could consider ischemic work up later if renal function improves. Susepct he may need RHC  - PICC placed initial Co-ox  40%. Started Milrinone 0.25 - Co-ox 65% today  - Remains markedly volume overloaded, refractory to high dose lasix gtt + rising SCr - May need UF to help w/ fluid removal. Nephrology following  - May need RHC to better assess filling pressures/ output   - Continue Lasix gtt at 30/hr for now  - D/w nephrology. Will give 2.5 mg of metolazone to augment diuresis. Follow response, repeat BMP this afternoon. If SCr/K stable, will give an additional 2.5 this evening - No bb w/ low output  - No arb/dig/spiro with AKI  - c/w unna boots.  - Fluid restrict 1500 cc  3. AKI on CKD Stage IIIa - Creatinine on admit 2.8.  Most recent creatinine was 1.55 back in 03/2020. - started on milrinone for low output - Creatinine trending up, now at 3.3. Nonoliguric 2.4L in UOP yesterday  - Nephrology following, may need UF to help off load volume - Consider RHC  4. DMII - Hgb A1C 5.9  -  On SSI  5. Lower Extremity Edema - Dopplers negative for DVT - 2/2 CHF Continue diuretics + unna boots   6. 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  -Continue PPI  7. HTN  - Pressures soft but stable - monitor w/ afib and w/ diuresis   8. Hypervolemic Hyponatremia - Na 132 - Continue diuretics, may need HD for UF    Length of Stay: [address] Ladoris Gene  10/15/2020, 9:36 AM  Advanced Heart Failure Team Pager [phone] (M-F; 7a - 5p)  Please contact [facility] for night-coverage after hours (4p -7a ) and weekends on [email or web address]  Patient seen and examined with the above-signed Advanced Practice Provider and/or Housestaff. I personally reviewed laboratory data, imaging studies and relevant notes. I independently examined the patient and formulated the important aspects of the plan. I have edited the note to reflect any of my changes or salient points. I have personally discussed the plan with the patient and/or family.  Weight continues to come down slowly but he remains massively volume overloaded and  creatinine getting worse. Remains in AF with RVR despite IV amio load.   General:  Lying in bed No resp difficulty HEENT: normal Neck: supple. JVP to jaw  Carotids 2+ bilat; no bruits. No lymphadenopathy or thryomegaly appreciated. Cor: PMI nondisplaced. Irregular tachy  2/6 TR Lungs: clear Abdomen: obese soft, nontender, nondistended. No hepatosplenomegaly. No bruits or masses. Good bowel sounds. Extremities: no cyanosis, clubbing, rash, 3+ edema UNNA Neuro: alert & orientedx3, cranial nerves grossly intact. moves all 4 extremities w/o difficulty. Affect pleasant  I feel we are likely getting near the point where trial of UF is probably warranted. Will consider RHC tomorrow to first assess hemodynamics then consider need for UF trial. We can put HD cath in while in cath lab as needed.   Glori Bickers, MD  9:49 PM

## 2020-10-15 NOTE — Anesthesia Postprocedure Evaluation (Signed)
Anesthesia Post Note  Patient: Kyle Hicks  Procedure(s) Performed: TRANSESOPHAGEAL ECHOCARDIOGRAM (TEE) (N/A ) CARDIOVERSION (N/A )     Patient location during evaluation: PACU Anesthesia Type: General Level of consciousness: awake and alert Pain management: pain level controlled Vital Signs Assessment: post-procedure vital signs reviewed and stable Respiratory status: spontaneous breathing, nonlabored ventilation, respiratory function stable and patient connected to nasal cannula oxygen Cardiovascular status: blood pressure returned to baseline and stable Postop Assessment: no apparent nausea or vomiting Anesthetic complications: no   No complications documented.  Last Vitals:  Vitals:   10/15/20 0757 10/15/20 1152  BP: 112/68 107/75  Pulse: 93 64  Resp: (!) 21 (!) 21  Temp: 36.9 C 36.5 C  SpO2: 96% 100%    Last Pain:  Vitals:   10/15/20 1152  TempSrc: Oral  PainSc: 0-No pain                 Nathanyl Andujo

## 2020-10-16 ENCOUNTER — Encounter (HOSPITAL_COMMUNITY): Payer: Self-pay | Admitting: Internal Medicine

## 2020-10-16 ENCOUNTER — Encounter (HOSPITAL_COMMUNITY)
Admission: EM | Disposition: A | Discharge status: Home Health | Payer: Self-pay | Source: Home / Self Care | Attending: Internal Medicine

## 2020-10-16 DIAGNOSIS — R197 Diarrhea, unspecified: Secondary | ICD-10-CM | POA: Diagnosis not present

## 2020-10-16 DIAGNOSIS — I10 Essential (primary) hypertension: Secondary | ICD-10-CM | POA: Diagnosis not present

## 2020-10-16 DIAGNOSIS — I272 Pulmonary hypertension, unspecified: Secondary | ICD-10-CM

## 2020-10-16 DIAGNOSIS — N179 Acute kidney failure, unspecified: Secondary | ICD-10-CM | POA: Diagnosis not present

## 2020-10-16 DIAGNOSIS — I4819 Other persistent atrial fibrillation: Secondary | ICD-10-CM | POA: Diagnosis not present

## 2020-10-16 DIAGNOSIS — I5041 Acute combined systolic (congestive) and diastolic (congestive) heart failure: Secondary | ICD-10-CM | POA: Diagnosis not present

## 2020-10-16 DIAGNOSIS — I5021 Acute systolic (congestive) heart failure: Secondary | ICD-10-CM | POA: Diagnosis not present

## 2020-10-16 DIAGNOSIS — I5023 Acute on chronic systolic (congestive) heart failure: Secondary | ICD-10-CM | POA: Diagnosis not present

## 2020-10-16 HISTORY — PX: TEMPORARY DIALYSIS CATHETER: CATH118312

## 2020-10-16 HISTORY — PX: RIGHT HEART CATH: CATH118263

## 2020-10-16 LAB — POCT I-STAT EG7
Acid-base deficit: 3 mmol/L — ABNORMAL HIGH (ref 0.0–2.0)
Acid-base deficit: 2 mmol/L (ref 0.0–2.0)
Bicarbonate: 20.9 mmol/L (ref 20.0–28.0)
Bicarbonate: 22.3 mmol/L (ref 20.0–28.0)
Calcium, Ion: 0.96 mmol/L — ABNORMAL LOW (ref 1.15–1.40)
Calcium, Ion: 1.09 mmol/L — ABNORMAL LOW (ref 1.15–1.40)
HCT: 32 % — ABNORMAL LOW (ref 39.0–52.0)
HCT: 34 % — ABNORMAL LOW (ref 39.0–52.0)
Hemoglobin: 10.9 g/dL — ABNORMAL LOW (ref 13.0–17.0)
Hemoglobin: 11.6 g/dL — ABNORMAL LOW (ref 13.0–17.0)
O2 Saturation: 62 %
O2 Saturation: 63 %
Potassium: 3.4 mmol/L — ABNORMAL LOW (ref 3.5–5.1)
Potassium: 3.7 mmol/L (ref 3.5–5.1)
Sodium: 136 mmol/L (ref 135–145)
Sodium: 133 mmol/L — ABNORMAL LOW (ref 135–145)
TCO2: 22 mmol/L (ref 22–32)
TCO2: 23 mmol/L (ref 22–32)
pCO2, Ven: 34.4 mmHg — ABNORMAL LOW (ref 44.0–60.0)
pCO2, Ven: 36.7 mmHg — ABNORMAL LOW (ref 44.0–60.0)
pH, Ven: 7.392 (ref 7.250–7.430)
pH, Ven: 7.391 (ref 7.250–7.430)
pO2, Ven: 32 mmHg (ref 32.0–45.0)
pO2, Ven: 33 mmHg (ref 32.0–45.0)

## 2020-10-16 LAB — GLUCOSE, CAPILLARY
Glucose-Capillary: 115 mg/dL — ABNORMAL HIGH (ref 70–99)
Glucose-Capillary: 130 mg/dL — ABNORMAL HIGH (ref 70–99)
Glucose-Capillary: 137 mg/dL — ABNORMAL HIGH (ref 70–99)
Glucose-Capillary: 137 mg/dL — ABNORMAL HIGH (ref 70–99)
Glucose-Capillary: 142 mg/dL — ABNORMAL HIGH (ref 70–99)
Glucose-Capillary: 149 mg/dL — ABNORMAL HIGH (ref 70–99)
Glucose-Capillary: 173 mg/dL — ABNORMAL HIGH (ref 70–99)

## 2020-10-16 LAB — COOXEMETRY PANEL
Carboxyhemoglobin: 1 % (ref 0.5–1.5)
Methemoglobin: 0.9 % (ref 0.0–1.5)
O2 Saturation: 66.7 %
Total hemoglobin: 10.5 g/dL — ABNORMAL LOW (ref 12.0–16.0)

## 2020-10-16 LAB — BASIC METABOLIC PANEL
Anion gap: 10 (ref 5–15)
BUN: 54 mg/dL — ABNORMAL HIGH (ref 8–23)
CO2: 23 mmol/L (ref 22–32)
Calcium: 8.1 mg/dL — ABNORMAL LOW (ref 8.9–10.3)
Chloride: 100 mmol/L (ref 98–111)
Creatinine, Ser: 3.48 mg/dL — ABNORMAL HIGH (ref 0.61–1.24)
GFR, Estimated: 17 mL/min — ABNORMAL LOW (ref >60)
Glucose, Bld: 134 mg/dL — ABNORMAL HIGH (ref 70–99)
Potassium: 3.6 mmol/L (ref 3.5–5.1)
Sodium: 133 mmol/L — ABNORMAL LOW (ref 135–145)

## 2020-10-16 LAB — CBC
HCT: 30.7 % — ABNORMAL LOW (ref 39.0–52.0)
Hemoglobin: 10.3 g/dL — ABNORMAL LOW (ref 13.0–17.0)
MCH: 28 pg (ref 26.0–34.0)
MCHC: 33.6 g/dL (ref 30.0–36.0)
MCV: 83.4 fL (ref 80.0–100.0)
Platelets: 143 10*3/uL — ABNORMAL LOW (ref 150–400)
RBC: 3.68 MIL/uL — ABNORMAL LOW (ref 4.22–5.81)
RDW: 19 % — ABNORMAL HIGH (ref 11.5–15.5)
WBC: 5.4 10*3/uL (ref 4.0–10.5)
nRBC: 0 % (ref 0.0–0.2)

## 2020-10-16 LAB — HEPARIN LEVEL (UNFRACTIONATED)
Heparin Unfractionated: 0.27 IU/mL — ABNORMAL LOW (ref 0.30–0.70)
Heparin Unfractionated: 0.3 IU/mL (ref 0.30–0.70)

## 2020-10-16 SURGERY — RIGHT HEART CATH
Anesthesia: LOCAL

## 2020-10-16 MED ORDER — HYDRALAZINE HCL 20 MG/ML IJ SOLN: 10.0000 mg | INTRAMUSCULAR | Status: AC | PRN

## 2020-10-16 MED ORDER — LIDOCAINE HCL (PF) 1 % IJ SOLN
INTRAMUSCULAR | Status: AC
  Filled 2020-10-16: qty 30

## 2020-10-16 MED ORDER — HEPARIN (PORCINE) 25000 UT/250ML-% IV SOLN
1450.0000 [IU]/h | INTRAVENOUS | Status: DC
  Administered 2020-10-16 – 2020-10-17 (×2): 1400 [IU]/h via INTRAVENOUS
  Administered 2020-10-18 – 2020-10-19 (×2): 1350 [IU]/h via INTRAVENOUS
  Administered 2020-10-20 – 2020-10-21 (×2): 1400 [IU]/h via INTRAVENOUS
  Administered 2020-10-22: 1450 [IU]/h via INTRAVENOUS
  Filled 2020-10-16 (×10): qty 250

## 2020-10-16 MED ORDER — HEPARIN (PORCINE) IN NACL 1000-0.9 UT/500ML-% IV SOLN
INTRAVENOUS | Status: AC
  Filled 2020-10-16: qty 500

## 2020-10-16 MED ORDER — ASPIRIN 81 MG PO CHEW
81.0000 mg | CHEWABLE_TABLET | ORAL | Status: AC
  Administered 2020-10-16: 81 mg via ORAL
  Filled 2020-10-16: qty 1

## 2020-10-16 MED ORDER — SODIUM CHLORIDE 0.9% FLUSH
3.0000 mL | Freq: Two times a day (BID) | INTRAVENOUS | Status: DC
  Administered 2020-10-16 – 2020-10-25 (×8): 3 mL via INTRAVENOUS

## 2020-10-16 MED ORDER — SODIUM CHLORIDE 0.9% FLUSH: 3.0000 mL | INTRAVENOUS | Status: DC | PRN

## 2020-10-16 MED ORDER — AMIODARONE HCL IN DEXTROSE 360-4.14 MG/200ML-% IV SOLN
INTRAVENOUS | Status: AC
  Filled 2020-10-16: qty 200

## 2020-10-16 MED ORDER — ONDANSETRON HCL 4 MG/2ML IJ SOLN: 4.0000 mg | Freq: Four times a day (QID) | INTRAMUSCULAR | Status: DC | PRN

## 2020-10-16 MED ORDER — SODIUM CHLORIDE 0.9 % IV SOLN: INTRAVENOUS | Status: DC

## 2020-10-16 MED ORDER — ASPIRIN 81 MG PO CHEW: 81.0000 mg | CHEWABLE_TABLET | ORAL | Status: DC

## 2020-10-16 MED ORDER — SODIUM CHLORIDE 0.9 % IV SOLN: 250.0000 mL | INTRAVENOUS | Status: DC | PRN

## 2020-10-16 MED ORDER — LABETALOL HCL 5 MG/ML IV SOLN
10.0000 mg | INTRAVENOUS | Status: AC | PRN
Start: 2020-10-16 — End: 2020-10-16

## 2020-10-16 MED ORDER — POTASSIUM CHLORIDE CRYS ER 20 MEQ PO TBCR
20.0000 meq | EXTENDED_RELEASE_TABLET | Freq: Once | ORAL | Status: AC
  Administered 2020-10-16: 20 meq via ORAL
  Filled 2020-10-16: qty 1

## 2020-10-16 MED ORDER — SODIUM CHLORIDE 0.9% FLUSH
3.0000 mL | Freq: Two times a day (BID) | INTRAVENOUS | Status: DC
  Administered 2020-10-16 – 2020-10-26 (×7): 3 mL via INTRAVENOUS

## 2020-10-16 MED ORDER — CHLORHEXIDINE GLUCONATE CLOTH 2 % EX PADS
6.0000 | MEDICATED_PAD | Freq: Every day | CUTANEOUS | Status: DC
  Administered 2020-10-17 – 2020-10-26 (×8): 6 via TOPICAL

## 2020-10-16 MED ORDER — HEPARIN (PORCINE) IN NACL 1000-0.9 UT/500ML-% IV SOLN
INTRAVENOUS | Status: DC | PRN
  Administered 2020-10-16: 500 mL

## 2020-10-16 MED ORDER — ACETAMINOPHEN 325 MG PO TABS: 650.0000 mg | ORAL_TABLET | ORAL | Status: DC | PRN

## 2020-10-16 MED ORDER — LIDOCAINE HCL (PF) 1 % IJ SOLN
INTRAMUSCULAR | Status: DC | PRN
  Administered 2020-10-16: 2 mL

## 2020-10-16 SURGICAL SUPPLY — 9 items
CATH SWAN GANZ 7F STRAIGHT (CATHETERS) ×1 IMPLANT
CATH TRIALYSIS 15CM 13FR CVD (CATHETERS) ×1 IMPLANT
KIT MICROPUNCTURE NIT STIFF (SHEATH) ×1 IMPLANT
PACK CARDIAC CATHETERIZATION (CUSTOM PROCEDURE TRAY) ×3 IMPLANT
SHEATH PINNACLE 7F 10CM (SHEATH) ×1 IMPLANT
SHEATH PROBE COVER 6X72 (BAG) ×1 IMPLANT
TRANSDUCER W/STOPCOCK (MISCELLANEOUS) ×3 IMPLANT
TUBING ART PRESS 72  MALE/FEM (TUBING) ×3
TUBING ART PRESS 72 MALE/FEM (TUBING) IMPLANT

## 2020-10-16 NOTE — Interval H&P Note (Signed)
History and Physical Interval Note:  10/16/2020 1:02 PM  Kyle Hicks  has presented today for surgery, with the diagnosis of heart failure.  The various methods of treatment have been discussed with the patient and family. After consideration of risks, benefits and other options for treatment, the patient has consented to  Procedure(s): RIGHT HEART CATH (N/A) and possible placement of venous HD cath as a surgical intervention.  The patient's history has been reviewed, patient examined, no change in status, stable for surgery.  I have reviewed the patient's chart and labs.  Questions were answered to the patient's satisfaction.     [name]

## 2020-10-16 NOTE — Progress Notes (Addendum)
PROGRESS NOTE    Kyle Hicks  [id number] DOB: 03-Apr-1941 DOA: 10/07/2020 PCP: Sharilyn Sites, MD    Brief Narrative:  Kyle Hicks was admitted to the hospital working diagnosis of acute on chronic systolic heart failure decompensation, complicated with pulmonary HTN class 2, acute core pulmonale, biventricular failure..  80 year old male past medical history for hypertension, heart failure, type 2 diabetes mellitus and history of GI bleed who presented after mechanical fall.  On the day of admission he got up to use the restroom, fell a week and sustained a mechanical fall.  No head trauma or loss of consciousness.  Reports lower extremity edema, dyspnea, wheezing or palpitations.  Positive diarrhea.  On his initial physical examination heart rate 129-134, respiratory rate 19-43, blood pressure 105/73-130 8/108, oxygen saturation 98% on supplemental oxygen.  His lungs had no wheezing or rales, heart S1-S2, present, rhythmic, tachycardic, abdomen soft, positive lower extremity edema.  Chest radiograph with cardiomegaly, positive hilar vascular congestion, small right pleural effusion.  EKG 133 bpm, left axis deviation, Q waves lead II, lead III, aVF, prolonged QTc 545, right bundle branch block, atrial flutter, no ST segment changes, negative T wave V1-V3.  Patient suspected low output heart failure and started on milrinone infusion along with furosemide drip. Amiodarone drip for rate control atrial flutter.   Right heart cath with PCW 22 with PA mean 33 and cardiac output 6.0 per Fick and 6.9 per thermodilution. (on milrinone 0.25 mcg/kg/hr) Right HD IJ HD cathter placed and plan for further fluid removal per ultrafiltration.   Assessment & Plan:   Principal Problem:   Acute CHF (congestive heart failure) (HCC) Active Problems:   Acute kidney injury superimposed on chronic kidney disease (HCC)   Essential hypertension   Elevated d-dimer   Obesity (BMI 30-39.9)   Diarrhea    Atrial flutter (HCC)   Atrial fibrillation ([place])    1. Acute on chronic systolic heart failure/ biventricular failure/ acute core pulmonale/ precapillary pulmonary hypertension, class 2.  LV EF 10 to 15%, global hypokinesis, SV 41 RV systolic severely reduced, RVSP 49,1 mmHg.  Bi-atrial enlargement, severe TR.   Patient reports improvement in dyspnea but not yet back to baseline, continue to have significant edema.  Urine output over last 24 hrs is [id number] cc, systolic blood pressure [id number] mmHg this am, with serum cr trending up to 3,48.   Right heart catheterization today: PA mean 33 PCW 22 PVR 1.8 Cardiac output 6.0 (Fick) 6,9 (thermodilution) on milrinone 0,25 mcg/kg/hr.  Plan for further fluid removal per ultrafiltration. Continue with furosemide drip and amiodarone infusion for rate control.   2. Atrial flutter with RVR/ prolonged QTc. Sp unsuccessful cardioversion 03/18.  Persistent tachycardia, continue with amiodarone infusion and heparin drip.  Possible cardioversion when improves volume status.  3. 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 Patient with persistent hypervolemia,  Renal function with serum cr at 3,48, K is 3,6 and Na 133, urine output 1,600 over last 24 hrs.   4. T2DM with dyslipidemia glucose this am is 134, continue with sliding scale for glucose cover and monitoring.  On atorvastatin   5. Obesity class 3. Calculated BMI is 40,0  6. HTN. Continue aggressive diuresis    Patient continue to be at high risk for worsening heart failure   Status is: Inpatient  Remains inpatient appropriate because:IV treatments appropriate due to intensity of illness or inability to take PO   Dispo: The patient is from: Home  Anticipated d/c is to: Home              Patient currently is not medically stable to d/c.   Difficult to place patient No   DVT prophylaxis: Heparin   Code Status:   full  Family Communication:  No family at the bedside      Consultants:   Cardiology   Nephrology   Procedures:   Right heart catheterization 03/24   Subjective: Patient continue to have dyspnea and edema, mild improvement but not yet back to baseline, no nausea or vomiting, no chest pain.   Objective: Vitals:   10/16/20 0700 10/16/20 0742 10/16/20 1123 10/16/20 1125  BP:      Pulse: 67     Resp: 19     Temp:  98.3 F (36.8 C) 98.2 F (36.8 C)   TempSrc:  Oral Oral   SpO2: 98%   98%  Weight:      Height:        Intake/Output Summary (Last 24 hours) at 10/16/2020 1224 Last data filed at 10/16/2020 1128 Gross per 24 hour  Intake 5669.82 ml  Output 2200 ml  Net 3469.82 ml   Filed Weights   10/14/20 0428 10/15/20 0602 10/16/20 0424  Weight: 116.4 kg 114.8 kg 114.7 kg    Examination:   General: deconditioned and ill looking appearing  Neurology: Awake and alert, non focal  E ENT: no pallor, no icterus, oral mucosa moist Cardiovascular: No JVD. S1-S2 present, rhythmic, no gallops, rubs, or murmurs. +++ pitting bilateral lower extremity edema. Unna boots in place.  Pulmonary: positive breath sounds bilaterally, decreased at the dependent zones.  Gastrointestinal. Abdomen protuberant  Skin. No rashes Musculoskeletal: no joint deformities     Data Reviewed: I have personally reviewed following labs and imaging studies  CBC: Recent Labs  Lab 10/12/20 0500 10/13/20 0500 10/14/20 0406 10/15/20 0431 10/16/20 0500  WBC 5.3 5.3 5.9 5.2 5.4  HGB 10.4* 10.1* 11.0* 10.3* 10.3*  HCT 31.8* 30.2* 31.7* 30.2* 30.7*  MCV 85.9 84.6 83.4 83.0 83.4  PLT 131* 126* 155 145* [id number]*   Basic Metabolic Panel: Recent Labs  Lab 10/13/20 0500 10/14/20 0406 10/15/20 0431 10/15/20 1300 10/16/20 0500  NA 134* 133* 132* 134* 133*  K 3.8 3.8 3.4* 3.8 3.6  CL 105 102 100 101 100  CO2 20* 20* 21* 21* 23  GLUCOSE 124* 134* 128* 152* 134*  BUN 48* 50* 54* 54* 54*  CREATININE 2.82* 3.02* 3.25* 3.35* 3.48*  CALCIUM 8.1* 8.4* 8.1*  8.2* 8.1*   GFR: Estimated Creatinine Clearance: 21.2 mL/min (A) (by C-G formula based on SCr of 3.48 mg/dL (H)). Liver Function Tests: No results for input(s): AST, ALT, ALKPHOS, BILITOT, PROT, ALBUMIN in the last 168 hours. No results for input(s): LIPASE, AMYLASE in the last 168 hours. No results for input(s): AMMONIA in the last 168 hours. Coagulation Profile: No results for input(s): INR, PROTIME in the last 168 hours. Cardiac Enzymes: No results for input(s): CKTOTAL, CKMB, CKMBINDEX, TROPONINI in the last 168 hours. BNP (last 3 results) No results for input(s): PROBNP in the last 8760 hours. HbA1C: No results for input(s): HGBA1C in the last 72 hours. CBG: Recent Labs  Lab 10/15/20 2003 10/16/20 0021 10/16/20 0423 10/16/20 0743 10/16/20 1125  GLUCAP 156* 137* 115* 130* 137*   Lipid Profile: No results for input(s): CHOL, HDL, LDLCALC, TRIG, CHOLHDL, LDLDIRECT in the last 72 hours. Thyroid Function Tests: No results for input(s): TSH, T4TOTAL, FREET4, T3FREE, THYROIDAB  in the last 72 hours. Anemia Panel: No results for input(s): VITAMINB12, FOLATE, FERRITIN, TIBC, IRON, RETICCTPCT in the last 72 hours.    Radiology Studies: I have reviewed all of the imaging during this hospital visit personally     Scheduled Meds: . atorvastatin  20 mg Oral Daily  . Chlorhexidine Gluconate Cloth  6 each Topical Daily  . insulin aspart  0-6 Units Subcutaneous Q4H  . pantoprazole  40 mg Oral Daily  . sodium chloride flush  3 mL Intravenous Q12H  . sodium chloride flush  3 mL Intravenous Q12H   Continuous Infusions: . sodium chloride    . sodium chloride 20 mL/hr at 10/16/20 1222  . sodium chloride    . sodium chloride    . amiodarone 60 mg/hr (10/16/20 0634)  . furosemide (LASIX) 200 mg in dextrose 5% 100 mL ('2mg'$ /mL) infusion 30 mg/hr (10/16/20 0940)  . heparin Stopped (10/16/20 1223)  . milrinone 0.25 mcg/kg/min (10/16/20 [id number])     LOS: 9 days        Auna Mikkelsen  Gerome Apley, MD

## 2020-10-16 NOTE — Progress Notes (Signed)
PT Cancellation Note  Patient Details Name: Kyle Hicks MRN: [id number] DOB: 10-Feb-1941   Cancelled Treatment:    Reason Eval/Treat Not Completed: Patient at procedure or test/unavailable - going for RHC and HD cath placement. Will follow-up for PT treatment as schedule permits.  Mabeline Caras, PT, DPT Acute Rehabilitation Services  Pager [phone] Office [place] 10/16/2020, 12:30 PM

## 2020-10-16 NOTE — Care Management Important Message (Signed)
Important Message  Patient Details  Name: Kyle Hicks MRN: [id number] Date of Birth: Oct 14, 1940   Medicare Important Message Given:  Yes     Shelda Altes 10/16/2020, 8:14 AM

## 2020-10-16 NOTE — Progress Notes (Addendum)
Advanced Heart Failure Rounding Note  PCP-Cardiologist: No primary care provider on file.    Patient Profile   80 y/o male with obesity and HTN. Admitted with acute HF in setting of AF with RVR of unknown duration.   Echo LVEF 10-15%. RV severely reduced. PICC placed with initial co-ox 40% suggesting markedly low output. CVP up  Subjective:    TEE 3/18 w/ severe Biv dysfunction, Mod-severe MR, severe TR. No LAA thrombus. DCCV unsuccessful   Remains on Amio gtt at 60/hr. Still in Afib w/ RVR 110s at rest>>130s when up.   On Milrinone 0.25. Co-ox 68%    Refractory marked fluid overload despite high dose lasix gtt, on 30/hr + metolazone. Diuresing but suboptimal response. Only - 1.6L in UOP yesterday. CVP coming down at 12 today but remains edematous (3rd spacing). Scr trending up. 2.59>2.82>3.02>>3.3>3.5   No complaints currently denies dyspnea.   Objective:   Weight Range: 114.7 kg Body mass index is 40.81 kg/m.   Vital Signs:   Temp:  [97.7 F (36.5 C)-98.7 F (37.1 C)] 98.3 F (36.8 C) (03/24 0742) Pulse Rate:  [54-137] 67 (03/24 0700) Resp:  [16-29] 19 (03/24 0700) BP: (100-131)/(65-95) 131/70 (03/24 0424) SpO2:  [92 %-100 %] 98 % (03/24 0700) Weight:  [114.7 kg] 114.7 kg (03/24 0424) Last BM Date: 10/15/20  Weight change: Filed Weights   10/14/20 0428 10/15/20 0602 10/16/20 0424  Weight: 116.4 kg 114.8 kg 114.7 kg    Intake/Output:   Intake/Output Summary (Last 24 hours) at 10/16/2020 1028 Last data filed at 10/16/2020 0920 Gross per 24 hour  Intake 5669.82 ml  Output 1600 ml  Net 4069.82 ml      Physical Exam    CVP 12 General: obese male, No resp difficulty HEENT:dentition in poor repair, otherwise normal   Neck: supple. JVP to jaw. Carotids 2+ bilat; no bruits. No lymphadenopathy or thryomegaly appreciated. Cor: PMI nondisplaced. Irregularly irregular rhythm, tachy rate  Lungs: decreased at bases, no wheezing  Abdomen: obese soft,  nontender, nondistended. No hepatosplenomegaly. No bruits or masses. Good bowel sounds. Extremities: no cyanosis, clubbing, rash, 2+ bilateral LEE up to thighs, bilateral unna boots Neuro: alert & orientedx3, cranial nerves grossly intact. moves all 4 extremities w/o difficulty. Affect pleasant  Telemetry   Afib w/ RVR 110s-120s  Personally reviewed  Labs    CBC Recent Labs    10/15/20 0431 10/16/20 0500  WBC 5.2 5.4  HGB 10.3* 10.3*  HCT 30.2* 30.7*  MCV 83.0 83.4  PLT 145* [id number]*   Basic Metabolic Panel Recent Labs    10/15/20 1300 10/16/20 0500  NA 134* 133*  K 3.8 3.6  CL 101 100  CO2 21* 23  GLUCOSE 152* 134*  BUN 54* 54*  CREATININE 3.35* 3.48*  CALCIUM 8.2* 8.1*   Liver Function Tests No results for input(s): AST, ALT, ALKPHOS, BILITOT, PROT, ALBUMIN in the last 72 hours. No results for input(s): LIPASE, AMYLASE in the last 72 hours. Cardiac Enzymes No results for input(s): CKTOTAL, CKMB, CKMBINDEX, TROPONINI in the last 72 hours.  BNP: BNP (last 3 results) Recent Labs    10/07/20 0229  BNP 4,299.0*    ProBNP (last 3 results) No results for input(s): PROBNP in the last 8760 hours.   D-Dimer No results for input(s): DDIMER in the last 72 hours. Hemoglobin A1C No results for input(s): HGBA1C in the last 72 hours. Fasting Lipid Panel No results for input(s): CHOL, HDL, LDLCALC, TRIG, CHOLHDL, LDLDIRECT in the  last 72 hours. Thyroid Function Tests No results for input(s): TSH, T4TOTAL, T3FREE, THYROIDAB in the last 72 hours.  Invalid input(s): FREET3  Other results:   Imaging    No results found.   Medications:     Scheduled Medications: . atorvastatin  20 mg Oral Daily  . Chlorhexidine Gluconate Cloth  6 each Topical Daily  . insulin aspart  0-6 Units Subcutaneous Q4H  . pantoprazole  40 mg Oral Daily  . sodium chloride flush  3 mL Intravenous Q12H    Infusions: . sodium chloride    . amiodarone 60 mg/hr (10/16/20 0634)  .  furosemide (LASIX) 200 mg in dextrose 5% 100 mL ('2mg'$ /mL) infusion 30 mg/hr (10/16/20 0940)  . heparin 1,400 Units/hr (10/16/20 0402)  . milrinone 0.25 mcg/kg/min (10/16/20 [id number])    PRN Medications: sodium chloride, acetaminophen, albuterol, dextrose, hydrocortisone cream, ondansetron (ZOFRAN) IV, sodium chloride flush, sodium chloride flush  Assessment/Plan   1. A Flutter RVR - Started on amiodarone drip. Remains tachycardic with rates > 110-130 - Failed attempt at DCCV 3/18 - Continue IV amiodarone load, repeat DCCV after better diuresed - TSH normal - Continue IV heparin  2. 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  - Echo this admit w/ severe biventricular HF with EF < 15%.  BNP > 4000 . Suspect tachy-mediated. HS Trop - no trend. No chest pain. No LHC for now. Could consider ischemic work up later if renal function improves. Susepct he may need RHC  - PICC placed initial Co-ox 40%. Started Milrinone 0.25 - Co-ox 68% today  - Remains markedly volume overloaded, refractory to high dose lasix gtt and metolazone w/ rising SCr  - Plan RHC today and placement of HD cath, will likely need UF to help w/ fluid removal - Continue Lasix gtt at 30/hr for now  - No bb w/ low output  - No arb/dig/spiro with AKI  - c/w unna boots.  - Fluid restrict 1500 cc  3. AKI on CKD Stage IIIa - Creatinine on admit 2.8.  Most recent creatinine was 1.55 back in 03/2020. - started on milrinone for low output - Creatinine trending up, now at 3.5. Nonoliguric but UOP slowing  - RHC today  - Suspect he will need UF. Place HD cath today  - Nephrology appreciate their assistance   4. DMII - Hgb A1C 5.9  - On SSI  5. Lower Extremity Edema - Dopplers negative for DVT - 2/2 CHF Continue diuretics + unna boots   6. 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  -Continue PPI  7. HTN  - Pressures soft but stable - monitor w/ afib and w/ diuresis   8. Hypervolemic  Hyponatremia - Na 133 - Continue diuretics, may need HD for UF   Length of Stay: [id number] PA-C  10/16/2020, 10:28 AM  Advanced Heart Failure Team Pager [phone] (M-F; 7a - 5p)  Please contact [facility] for night-coverage after hours (4p -7a ) and weekends on [email or web address]  Patient seen and examined with the above-signed Advanced Practice Provider and/or Housestaff. I personally reviewed laboratory data, imaging studies and relevant notes. I independently examined the patient and formulated the important aspects of the plan. I have edited the note to reflect any of my changes or salient points. I have personally discussed the plan with the patient and/or family.  Remains on high dose lasix and milrinone. CVP coming down but weight unchanged and remains massively volume overloaded.  Still with AF with RVR despite IV amio.   General:  Sitting up in bed  No resp difficulty HEENT: normal Neck: supple.JVP to jaw Carotids 2+ bilat; no bruits. No lymphadenopathy or thryomegaly appreciated. Cor: PMI nondisplaced. Irregular tachy  Lungs: clear Abdomen: obese soft, nontender, + distended. No hepatosplenomegaly. No bruits or masses. Good bowel sounds. Extremities: no cyanosis, clubbing, rash, 3+ edema Neuro: alert & orientedx3, cranial nerves grossly intact. moves all 4 extremities w/o difficulty. Affect pleasant  Has refractory volume overload with rising creatinine. Will plan RHC today with possible placement of HD cath for UF. Continue amio for AF. Repeat DC-CV soon.   Glori Bickers, MD  1:00 PM

## 2020-10-16 NOTE — Progress Notes (Signed)
[facility] Progress Note    Assessment/ Plan:   Assessment/Recommendations: Kyle Hicks is a/an 80 y.o. 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 Fib/A. Flutter, CKD Stage 3a, T2DM, HTN, obesity.  1. Non-Oliguric AKI: Likely a combination of hypoperfusion secondary to tachycardia and vascular congestion secondary to cardiorenal syndrome. No evidence of uremic syndrome. Renal function largely stable today.  -Diuretics per Heart Failure team -Given poor diuresis in the past 24 hours, will coordinate with HF when to initiate RRT. He may be undergoing RHC first. -Continue to monitor daily Cr, Dose meds for GFR<15 -Monitor Daily I/Os, Daily weight  -Maintain MAP>65 for optimal renal perfusion.  -Agree with holding ACE-I, avoid further nephrotoxins including NSAIDS, Morphine. Unless absolutely necessary, avoid CT with contrast and/or MRI with gadolinium.  2. 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  UACR initially normal in 2018; markedly elevated in 2021 at 612. Additional differential includes obesity related GN.  Baseline creatinine was previously 1.2 - 1.6.  -Referral to Nephrology on discharge recommended -Urine studies once stabilized or in the outpatient basis  3. Acute Biventricular Heart Failure: Previous hx of HFpEF with progression to HFrEF with EF of 10-15%; suspected to be in the setting of A. Fib with RVR. Significantly volume overloaded with difficulty diuresing. He is currently on Lasix gtt and Milrinone with Metolazane -HF following  4. A. Fib with RVR: Failed DCCV on 3/18. Plan to repeat next week after diuresis. Currently on Amiodarone.   5. Type 2 Diabetes Mellitus: Per chart review, A1c mostly below 7% with the exception of 1-2 readings since 2014. Follows with Dr. Gae Bon (Endocrinology).  Current medication management prior to admission: Glipizide and Sitagliptin-Metformin.  -SSI (resistant)   6. Hx of HTN: Previously on HCTZ, Nifedipine and Ramipril. Currently all three are being held. BP has been soft during this admission. 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 At the time, patient refused further evaluation. LFTs WNL on admission. No obvious sequela of cirrhosis on examination. May benefit from repeat imaging.   8. HLD: Managed with Atoravastatin  Subjective:   Urine output decreased in the last 24 hours to 1.6 L. Weight down from yesterday by only 0.3 lbs.    Mr. Hornbostel states he is feeling fine today. He denies any acute complaints at this time. He feels as though his swelling is unchanged.   Objective:   BP 131/70 (BP Location: Right Arm)    Pulse 67    Temp 98.3 F (36.8 C) (Oral)    Resp 19    Ht '5\' 6"'$  (1.676 m)    Wt 114.7 kg    SpO2 98%    BMI 40.81 kg/m   Intake/Output Summary (Last 24 hours) at 10/16/2020 0835 Last data filed at 10/16/2020 [id number] Gross per 24 hour  Intake 5666.82 ml  Output 1600 ml  Net 4066.82 ml   Weight change: -0.105 kg  Physical Exam: [id number] elderly gentleman, appears stated age. No acute distress [id number] with rates in the 110s, irregularly irregular. No murmurs Resp:No wheezing or rales. No respiratory distress.  Abd:Non-tender to palpation. Abdominal 2+ pitting edema present.  Ext:2+ pitting edema of the lower extremities up the the superior thighs, 1+ pitting edema of the upper extremities.   Imaging: US RENAL  Result  Date: 10/14/2020 CLINICAL DATA:  Acute renal insufficiency EXAM: RENAL / URINARY TRACT ULTRASOUND COMPLETE COMPARISON:  06/22/2017 FINDINGS: Right Kidney: Renal measurements: 11.1 x 5.4 x 6.2 cm = volume: 194 mL. Normal cortical thickness for age. Slightly increased echogenicity. Left Kidney: Renal measurements: 9.6 x 5.6 x 4.4  cm = volume: 124 mL. Renal cortical thinning. Lower pole 3.5 cm cyst. Bladder: Appears normal for degree of bladder distention. Other: Right-sided pleural effusion incidentally noted. Decreased study quality secondary to patient body habitus. IMPRESSION: 1.  No hydronephrosis. 2. Left renal cortical thinning with increased right renal cortical echogenicity, suggesting medical renal disease. 3. Right pleural effusion. 4. Decreased sensitivity and specificity exam due to technique/patient related factors, as described above. Electronically Signed   By: Abigail Miyamoto M.D.   On: 10/14/2020 18:08   Labs: BMET Recent Labs  Lab 10/11/20 0432 10/12/20 0500 10/13/20 0500 10/14/20 0406 10/15/20 0431 10/15/20 1300 10/16/20 0500  NA 136 134* 134* 133* 132* 134* 133*  K 4.4 4.1 3.8 3.8 3.4* 3.8 3.6  CL 107 105 105 102 100 101 100  CO2 18* 19* 20* 20* 21* 21* 23  GLUCOSE 151* 141* 124* 134* 128* 152* 134*  BUN 46* 47* 48* 50* 54* 54* 54*  CREATININE 2.63* 2.59* 2.82* 3.02* 3.25* 3.35* 3.48*  CALCIUM 8.2* 8.1* 8.1* 8.4* 8.1* 8.2* 8.1*   CBC Recent Labs  Lab 10/13/20 0500 10/14/20 0406 10/15/20 0431 10/16/20 0500  WBC 5.3 5.9 5.2 5.4  HGB 10.1* 11.0* 10.3* 10.3*  HCT 30.2* 31.7* 30.2* 30.7*  MCV 84.6 83.4 83.0 83.4  PLT 126* 155 145* 143*    Medications:     atorvastatin  20 mg Oral Daily   Chlorhexidine Gluconate Cloth  6 each Topical Daily   insulin aspart  0-6 Units Subcutaneous Q4H   pantoprazole  40 mg Oral Daily   sodium chloride flush  3 mL Intravenous Q12H   Dr. Jose Persia Internal Medicine PGY-2  10/16/2020, 8:35 AM

## 2020-10-16 NOTE — Progress Notes (Signed)
[place] for Xarelto>Heparin Indication: atrial fibrillation  No Known Allergies  Patient Measurements: Height: '5\' 6"'$  (167.6 cm) Weight: 114.7 kg (252 lb 13.9 oz) IBW/kg (Calculated) : 63.8 Heparin Dosing Weight: 89.3 kg  Vital Signs: Temp: 98.3 F (36.8 C) (03/24 0742) Temp Source: Oral (03/24 0742) BP: 131/70 (03/24 0424) Pulse Rate: 67 (03/24 0700)  Labs: Recent Labs    10/14/20 0406 10/15/20 0431 10/15/20 1300 10/16/20 0500  HGB 11.0* 10.3*  --  10.3*  HCT 31.7* 30.2*  --  30.7*  PLT 155 145*  --  143*  HEPARINUNFRC 0.42 0.35  --  0.27*  CREATININE 3.02* 3.25* 3.35* 3.48*    Estimated Creatinine Clearance: 21.2 mL/min (A) (by C-G formula based on SCr of 3.48 mg/dL (H)).   Assessment: 80 yo M with new Afib with RVR. No AC noted PTA. Was started on heparin then switched to Xarelto 3/16 and received 1 dose. Pharmacy asked to switch back to IV heparin. Patient now s/p unsuccessful DCCV. Plans to try again this week if adequately diuresed.  Heparin level slightly subtherapeutic at 0.27, on drip rate 1400 units/hr although he has been therapeutic on this drip rate for many days. CBC stable. No overt bleeding or infusion issues noted. Plans noted for RHC today with HD catheter placement - will hold on heparin adjustments for now.  Goal of Therapy:  Heparin level 0.3-0.7 units/ml Monitor platelets by anticoagulation protocol: Yes   Plan:  Continue heparin at 1400 units/hr for now Monitor daily HL, CBC, s/sx bleeding F/u [facility] plans post procedures  Richardine Service, PharmD, BCPS PGY2 Cardiology Pharmacy Resident Phone: [phone] 10/16/2020  10:36 AM  Please check [email or web address] for unit-specific pharmacy phone numbers.

## 2020-10-16 NOTE — Plan of Care (Signed)
Progressing

## 2020-10-16 NOTE — Progress Notes (Signed)
Orthopedic Tech Progress Note Patient Details:  REVIS BOTZ 07-Jun-1941 [id number]  Ortho Devices Type of Ortho Device: Louretta Parma boot Ortho Device/Splint Location: Bilateral Ortho Device/Splint Interventions: Application,Ordered   Post Interventions Patient Tolerated: Well Instructions Provided: Care of device,Poper ambulation with device   Lashon Beringer A Cyana Shook 10/16/2020, 7:53 PM

## 2020-10-16 NOTE — Progress Notes (Signed)
[place] for Xarelto>Heparin Indication: atrial fibrillation  No Known Allergies  Patient Measurements: Height: '5\' 6"'$  (167.6 cm) Weight: 114.7 kg (252 lb 13.9 oz) IBW/kg (Calculated) : 63.8 Heparin Dosing Weight: 89.3 kg  Vital Signs: Temp: 98.2 F (36.8 C) (03/24 1200) Temp Source: Oral (03/24 1200) BP: 107/70 (03/24 1200) Pulse Rate: 99 (03/24 1200)  Labs: Recent Labs    10/14/20 0406 10/15/20 0431 10/15/20 1300 10/16/20 0500  HGB 11.0* 10.3*  --  10.3*  HCT 31.7* 30.2*  --  30.7*  PLT 155 145*  --  143*  HEPARINUNFRC 0.42 0.35  --  0.27*  CREATININE 3.02* 3.25* 3.35* 3.48*    Estimated Creatinine Clearance: 21.2 mL/min (A) (by C-G formula based on SCr of 3.48 mg/dL (H)).   Assessment: 80 yo M with new Afib with RVR. No AC noted PTA. Was started on heparin then switched to Xarelto 3/16 and received 1 dose. Pharmacy asked to switch back to IV heparin. Patient now s/p unsuccessful DCCV. Plans to try again this week if adequately diuresed.  Heparin level slightly subtherapeutic at 0.27, on drip rate 1400 units/hr although he has been therapeutic on this drip rate for many days. CBC stable. No overt bleeding or infusion issues noted. Plans noted for RHC today with HD catheter placement - will hold on heparin adjustments for now.  Goal of Therapy:  Heparin level 0.3-0.7 units/ml Monitor platelets by anticoagulation protocol: Yes   Plan:  Continue heparin at 1400 units/hr for now Monitor daily HL, CBC, s/sx bleeding F/u [facility] plans post procedures  Richardine Service, PharmD, BCPS PGY2 Cardiology Pharmacy Resident Phone: [phone] 10/16/2020  2:12 PM  Please check [email or web address] for unit-specific pharmacy phone numbers. ========================= PM Update: Patient now s/p RHC and placement of HD cath. Pharmacy asked to restart heparin 1 hour after procedures. Will restart at prior rate of 1400 units/hr and check 8hr HL.  Richardine Service,  PharmD, BCPS PGY2 Cardiology Pharmacy Resident Phone: [phone] 10/16/2020  2:13 PM  Please check [email or web address] for unit-specific pharmacy phone numbers.

## 2020-10-16 NOTE — Progress Notes (Signed)
PA called for HF Team about patient's Heparin gtt.  She stated that she would place orders to have it restarted in 1 hour.  Heparin gtt restarted at 1500 per MD orders.  Pharmacy aware.

## 2020-10-16 NOTE — Plan of Care (Signed)
?  Problem: Cardiac: ?Goal: Ability to achieve and maintain adequate cardiopulmonary perfusion will improve ?Outcome: Progressing ?  ?Problem: Activity: ?Goal: Capacity to carry out activities will improve ?Outcome: Progressing ?  ?

## 2020-10-16 NOTE — H&P (View-Only) (Signed)
Advanced Heart Failure Rounding Note  PCP-Cardiologist: No primary care provider on file.    Patient Profile   80 y/o male with obesity and HTN. Admitted with acute HF in setting of AF with RVR of unknown duration.   Echo LVEF 10-15%. RV severely reduced. PICC placed with initial co-ox 40% suggesting markedly low output. CVP up  Subjective:    TEE 3/18 w/ severe Biv dysfunction, Mod-severe MR, severe TR. No LAA thrombus. DCCV unsuccessful   Remains on Amio gtt at 60/hr. Still in Afib w/ RVR 110s at rest>>130s when up.   On Milrinone 0.25. Co-ox 68%    Refractory marked fluid overload despite high dose lasix gtt, on 30/hr + metolazone. Diuresing but suboptimal response. Only - 1.6L in UOP yesterday. CVP coming down at 12 today but remains edematous (3rd spacing). Scr trending up. 2.59>2.82>3.02>>3.3>3.5   No complaints currently denies dyspnea.   Objective:   Weight Range: 114.7 kg Body mass index is 40.81 kg/m.   Vital Signs:   Temp:  [97.7 F (36.5 C)-98.7 F (37.1 C)] 98.3 F (36.8 C) (03/24 0742) Pulse Rate:  [54-137] 67 (03/24 0700) Resp:  [16-29] 19 (03/24 0700) BP: (100-131)/(65-95) 131/70 (03/24 0424) SpO2:  [92 %-100 %] 98 % (03/24 0700) Weight:  [114.7 kg] 114.7 kg (03/24 0424) Last BM Date: 10/15/20  Weight change: Filed Weights   10/14/20 0428 10/15/20 0602 10/16/20 0424  Weight: 116.4 kg 114.8 kg 114.7 kg    Intake/Output:   Intake/Output Summary (Last 24 hours) at 10/16/2020 1028 Last data filed at 10/16/2020 0920 Gross per 24 hour  Intake 5669.82 ml  Output 1600 ml  Net 4069.82 ml      Physical Exam    CVP 12 General: obese male, No resp difficulty HEENT:dentition in poor repair, otherwise normal   Neck: supple. JVP to jaw. Carotids 2+ bilat; no bruits. No lymphadenopathy or thryomegaly appreciated. Cor: PMI nondisplaced. Irregularly irregular rhythm, tachy rate  Lungs: decreased at bases, no wheezing  Abdomen: obese soft,  nontender, nondistended. No hepatosplenomegaly. No bruits or masses. Good bowel sounds. Extremities: no cyanosis, clubbing, rash, 2+ bilateral LEE up to thighs, bilateral unna boots Neuro: alert & orientedx3, cranial nerves grossly intact. moves all 4 extremities w/o difficulty. Affect pleasant  Telemetry   Afib w/ RVR 110s-120s  Personally reviewed  Labs    CBC Recent Labs    10/15/20 0431 10/16/20 0500  WBC 5.2 5.4  HGB 10.3* 10.3*  HCT 30.2* 30.7*  MCV 83.0 83.4  PLT 145* [id number]*   Basic Metabolic Panel Recent Labs    10/15/20 1300 10/16/20 0500  NA 134* 133*  K 3.8 3.6  CL 101 100  CO2 21* 23  GLUCOSE 152* 134*  BUN 54* 54*  CREATININE 3.35* 3.48*  CALCIUM 8.2* 8.1*   Liver Function Tests No results for input(s): AST, ALT, ALKPHOS, BILITOT, PROT, ALBUMIN in the last 72 hours. No results for input(s): LIPASE, AMYLASE in the last 72 hours. Cardiac Enzymes No results for input(s): CKTOTAL, CKMB, CKMBINDEX, TROPONINI in the last 72 hours.  BNP: BNP (last 3 results) Recent Labs    10/07/20 0229  BNP 4,299.0*    ProBNP (last 3 results) No results for input(s): PROBNP in the last 8760 hours.   D-Dimer No results for input(s): DDIMER in the last 72 hours. Hemoglobin A1C No results for input(s): HGBA1C in the last 72 hours. Fasting Lipid Panel No results for input(s): CHOL, HDL, LDLCALC, TRIG, CHOLHDL, LDLDIRECT in the  last 72 hours. Thyroid Function Tests No results for input(s): TSH, T4TOTAL, T3FREE, THYROIDAB in the last 72 hours.  Invalid input(s): FREET3  Other results:   Imaging    No results found.   Medications:     Scheduled Medications: . atorvastatin  20 mg Oral Daily  . Chlorhexidine Gluconate Cloth  6 each Topical Daily  . insulin aspart  0-6 Units Subcutaneous Q4H  . pantoprazole  40 mg Oral Daily  . sodium chloride flush  3 mL Intravenous Q12H    Infusions: . sodium chloride    . amiodarone 60 mg/hr (10/16/20 0634)  .  furosemide (LASIX) 200 mg in dextrose 5% 100 mL ('2mg'$ /mL) infusion 30 mg/hr (10/16/20 0940)  . heparin 1,400 Units/hr (10/16/20 0402)  . milrinone 0.25 mcg/kg/min (10/16/20 [id number])    PRN Medications: sodium chloride, acetaminophen, albuterol, dextrose, hydrocortisone cream, ondansetron (ZOFRAN) IV, sodium chloride flush, sodium chloride flush  Assessment/Plan   1. A Flutter RVR - Started on amiodarone drip. Remains tachycardic with rates > 110-130 - Failed attempt at DCCV 3/18 - Continue IV amiodarone load, repeat DCCV after better diuresed - TSH normal - Continue IV heparin  2. 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  - Echo this admit w/ severe biventricular HF with EF < 15%.  BNP > 4000 . Suspect tachy-mediated. HS Trop - no trend. No chest pain. No LHC for now. Could consider ischemic work up later if renal function improves. Susepct he may need RHC  - PICC placed initial Co-ox 40%. Started Milrinone 0.25 - Co-ox 68% today  - Remains markedly volume overloaded, refractory to high dose lasix gtt and metolazone w/ rising SCr  - Plan RHC today and placement of HD cath, will likely need UF to help w/ fluid removal - Continue Lasix gtt at 30/hr for now  - No bb w/ low output  - No arb/dig/spiro with AKI  - c/w unna boots.  - Fluid restrict 1500 cc  3. AKI on CKD Stage IIIa - Creatinine on admit 2.8.  Most recent creatinine was 1.55 back in 03/2020. - started on milrinone for low output - Creatinine trending up, now at 3.5. Nonoliguric but UOP slowing  - RHC today  - Suspect he will need UF. Place HD cath today  - Nephrology appreciate their assistance   4. DMII - Hgb A1C 5.9  - On SSI  5. Lower Extremity Edema - Dopplers negative for DVT - 2/2 CHF Continue diuretics + unna boots   6. 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  -Continue PPI  7. HTN  - Pressures soft but stable - monitor w/ afib and w/ diuresis   8. Hypervolemic  Hyponatremia - Na 133 - Continue diuretics, may need HD for UF   Length of Stay: [address] PA-C  10/16/2020, 10:28 AM  Advanced Heart Failure Team Pager [phone] (M-F; 7a - 5p)  Please contact [facility] for night-coverage after hours (4p -7a ) and weekends on [email or web address]  Patient seen and examined with the above-signed Advanced Practice Provider and/or Housestaff. I personally reviewed laboratory data, imaging studies and relevant notes. I independently examined the patient and formulated the important aspects of the plan. I have edited the note to reflect any of my changes or salient points. I have personally discussed the plan with the patient and/or family.  Remains on high dose lasix and milrinone. CVP coming down but weight unchanged and remains massively volume overloaded.  Still with AF with RVR despite IV amio.   General:  Sitting up in bed  No resp difficulty HEENT: normal Neck: supple.JVP to jaw Carotids 2+ bilat; no bruits. No lymphadenopathy or thryomegaly appreciated. Cor: PMI nondisplaced. Irregular tachy  Lungs: clear Abdomen: obese soft, nontender, + distended. No hepatosplenomegaly. No bruits or masses. Good bowel sounds. Extremities: no cyanosis, clubbing, rash, 3+ edema Neuro: alert & orientedx3, cranial nerves grossly intact. moves all 4 extremities w/o difficulty. Affect pleasant  Has refractory volume overload with rising creatinine. Will plan RHC today with possible placement of HD cath for UF. Continue amio for AF. Repeat DC-CV soon.   Glori Bickers, MD  1:00 PM

## 2020-10-17 ENCOUNTER — Other Ambulatory Visit (HOSPITAL_COMMUNITY): Payer: Medicare HMO

## 2020-10-17 DIAGNOSIS — N189 Chronic kidney disease, unspecified: Secondary | ICD-10-CM | POA: Diagnosis not present

## 2020-10-17 DIAGNOSIS — I10 Essential (primary) hypertension: Secondary | ICD-10-CM | POA: Diagnosis not present

## 2020-10-17 DIAGNOSIS — I4819 Other persistent atrial fibrillation: Secondary | ICD-10-CM | POA: Diagnosis not present

## 2020-10-17 DIAGNOSIS — I5041 Acute combined systolic (congestive) and diastolic (congestive) heart failure: Secondary | ICD-10-CM | POA: Diagnosis not present

## 2020-10-17 DIAGNOSIS — N179 Acute kidney failure, unspecified: Secondary | ICD-10-CM | POA: Diagnosis not present

## 2020-10-17 LAB — CBC
HCT: 29.6 % — ABNORMAL LOW (ref 39.0–52.0)
Hemoglobin: 9.9 g/dL — ABNORMAL LOW (ref 13.0–17.0)
MCH: 27.7 pg (ref 26.0–34.0)
MCHC: 33.4 g/dL (ref 30.0–36.0)
MCV: 82.9 fL (ref 80.0–100.0)
Platelets: 150 10*3/uL (ref 150–400)
RBC: 3.57 MIL/uL — ABNORMAL LOW (ref 4.22–5.81)
RDW: 18.7 % — ABNORMAL HIGH (ref 11.5–15.5)
WBC: 5.2 10*3/uL (ref 4.0–10.5)
nRBC: 0 % (ref 0.0–0.2)

## 2020-10-17 LAB — GLUCOSE, CAPILLARY
Glucose-Capillary: 130 mg/dL — ABNORMAL HIGH (ref 70–99)
Glucose-Capillary: 127 mg/dL — ABNORMAL HIGH (ref 70–99)
Glucose-Capillary: 159 mg/dL — ABNORMAL HIGH (ref 70–99)
Glucose-Capillary: 145 mg/dL — ABNORMAL HIGH (ref 70–99)
Glucose-Capillary: 198 mg/dL — ABNORMAL HIGH (ref 70–99)
Glucose-Capillary: 119 mg/dL — ABNORMAL HIGH (ref 70–99)

## 2020-10-17 LAB — COOXEMETRY PANEL
Carboxyhemoglobin: 0.9 % (ref 0.5–1.5)
Methemoglobin: 0.9 % (ref 0.0–1.5)
O2 Saturation: 63.5 %
Total hemoglobin: 10.6 g/dL — ABNORMAL LOW (ref 12.0–16.0)

## 2020-10-17 LAB — BASIC METABOLIC PANEL
Anion gap: 14 (ref 5–15)
BUN: 52 mg/dL — ABNORMAL HIGH (ref 8–23)
CO2: 20 mmol/L — ABNORMAL LOW (ref 22–32)
Calcium: 7.7 mg/dL — ABNORMAL LOW (ref 8.9–10.3)
Chloride: 93 mmol/L — ABNORMAL LOW (ref 98–111)
Creatinine, Ser: 3.52 mg/dL — ABNORMAL HIGH (ref 0.61–1.24)
GFR, Estimated: 17 mL/min — ABNORMAL LOW (ref >60)
Glucose, Bld: 318 mg/dL — ABNORMAL HIGH (ref 70–99)
Potassium: 3.5 mmol/L (ref 3.5–5.1)
Sodium: 127 mmol/L — ABNORMAL LOW (ref 135–145)

## 2020-10-17 LAB — HEPARIN LEVEL (UNFRACTIONATED): Heparin Unfractionated: 0.33 IU/mL (ref 0.30–0.70)

## 2020-10-17 MED ORDER — LIDOCAINE HCL (PF) 1 % IJ SOLN: 5.0000 mL | INTRAMUSCULAR | Status: DC | PRN

## 2020-10-17 MED ORDER — LIDOCAINE-PRILOCAINE 2.5-2.5 % EX CREA: 1.0000 "application " | TOPICAL_CREAM | CUTANEOUS | Status: DC | PRN

## 2020-10-17 MED ORDER — ALTEPLASE 2 MG IJ SOLR: 2.0000 mg | Freq: Once | INTRAMUSCULAR | Status: DC | PRN

## 2020-10-17 MED ORDER — PENTAFLUOROPROP-TETRAFLUOROETH EX AERO: 1.0000 "application " | INHALATION_SPRAY | CUTANEOUS | Status: DC | PRN

## 2020-10-17 MED ORDER — SODIUM CHLORIDE 0.9 % IV SOLN: 100.0000 mL | INTRAVENOUS | Status: DC | PRN

## 2020-10-17 MED ORDER — HEPARIN SODIUM (PORCINE) 1000 UNIT/ML DIALYSIS: 1000.0000 [IU] | INTRAMUSCULAR | Status: DC | PRN

## 2020-10-17 NOTE — Progress Notes (Signed)
   10/16/20 1959  Assess: MEWS Score  Temp 98 F (36.7 C)  BP 102/69  Pulse Rate 64  ECG Heart Rate (!) 123  Resp 16  SpO2 99 %  O2 Device Room Air  Assess: MEWS Score  MEWS Temp 0  MEWS Systolic 0  MEWS Pulse 2  MEWS RR 0  MEWS LOC 0  MEWS Score 2  MEWS Score Color Yellow  Assess: if the MEWS score is Yellow or Red  Were vital signs taken at a resting state? Yes  Focused Assessment No change from prior assessment  Early Detection of Sepsis Score *See Row Information* Low  MEWS guidelines implemented *See Row Information* No, previously yellow, continue vital signs every 4 hours  Document  Patient Outcome Stabilized after interventions  Progress note created (see row info) Yes

## 2020-10-17 NOTE — Progress Notes (Signed)
   10/17/20 [id number]  Assess: MEWS Score  BP 91/66  Pulse Rate (!) 117  ECG Heart Rate (!) 119  SpO2 95 %  Assess: MEWS Score  MEWS Temp 0  MEWS Systolic 1  MEWS Pulse 2  MEWS RR 0  MEWS LOC 0  MEWS Score 3  MEWS Score Color Yellow  Assess: if the MEWS score is Yellow or Red  Were vital signs taken at a resting state? Yes  Focused Assessment No change from prior assessment  Early Detection of Sepsis Score *See Row Information* Low  MEWS guidelines implemented *See Row Information* No, previously yellow, continue vital signs every 4 hours  Document  Patient Outcome Stabilized after interventions  Progress note created (see row info) Yes

## 2020-10-17 NOTE — Progress Notes (Signed)
Physical Therapy Treatment Patient Details Name: Kyle Hicks MRN: [id number] DOB: 1941-01-28 Today's Date: 10/17/2020    History of Present Illness Pt is a 80 y.o. male admitted 10/07/20 with c/o diarrhea, weakness and fall at home. CXR with enlarged cardiac sillhouette, small R-side pleural effusion. Pt also with tachycardia, HF, AKI. Pt with aflutter RVR 3/17. S/p TEE cardioversion 3/18 showing severe biventricular dysfunction, severe TR, mod-severe MR; DCCV unsuccessful. Course complicated by non-oliguric AKI, likely combo of hypoperfusion secondary to tachycardia and vascular congestion; for renal ultrasound 3/22. Plan RHC and placement of HD cath 3/24. PMH includes HTN, CHF, DM2, CKD IIIa, PUD, GIB.    PT Comments    Pt is slowly progressing towards goals limited somewhat by cardiac functioning.  Emphasis on transitions, multiple sit to stands from bed and bsc, progressing gait with monitoring of HR.    Follow Up Recommendations  Home health PT;Supervision for mobility/OOB     Equipment Recommendations  None recommended by PT    Recommendations for Other Services       Precautions / Restrictions Precautions Precautions: Fall Precaution Comments: Watch HR (Dr. Haroldine Hicks says HR in 140s-150s ok)    Mobility  Bed Mobility Overal bed mobility: Needs Assistance Bed Mobility: Supine to Sit     Supine to sit: Min assist;HOB elevated Sit to supine: Min assist;Mod assist   General bed mobility comments: trunk assist upright and legs into bed.    Transfers Overall transfer level: Needs assistance Equipment used: Rolling walker (2 wheeled) Transfers: Sit to/from [facility] Sit to Stand: Min guard;From elevated surface;Min assist Stand pivot transfers: Min guard       General transfer comment: cues for hand placement and sequencing.  Ambulation/Gait Ambulation/Gait assistance: Min guard;Min assist (min for assist maneuvering the RW) Gait Distance  (Feet): 90 Feet Assistive device: Rolling walker (2 wheeled) Gait Pattern/deviations: Step-to pattern;Step-through pattern;Trunk flexed;Wide base of support   Gait velocity interpretation: <1.31 ft/sec, indicative of household ambulator General Gait Details: cues for posture and better proximity to the RW, some assist maneuvering the RW as he fatigued.   Stairs             Wheelchair Mobility    Modified Rankin (Stroke Patients Only)       Balance Overall balance assessment: Needs assistance   Sitting balance-[name] Scale: Fair     Standing balance support: Bilateral upper extremity supported Standing balance-[name] Scale: Poor Standing balance comment: reliant on BUE support                            Cognition Arousal/Alertness: Awake/alert Behavior During Therapy: Flat affect Overall Cognitive Status: No family/caregiver present to determine baseline cognitive functioning (NT formally)                         Following Commands: Follows one step commands consistently   Awareness: Emergent Problem Solving: Slow processing        Exercises      General Comments General comments (skin integrity, edema, etc.): SpO2 on RA 94-97%, HR sustained in the 130's to 143 bpm, 120's with rest.      Pertinent Vitals/Pain Pain Assessment: Faces Faces Pain Scale: No hurt    Home Living                      Prior Function  PT Goals (current goals can now be found in the care plan section) Acute Rehab PT Goals Patient Stated Goal: To improve strength and return to independence PT Goal Formulation: With patient Time For Goal Achievement: 10/22/20 Potential to Achieve Goals: Good Progress towards PT goals: Progressing toward goals    Frequency    Min 3X/week      PT Plan Current plan remains appropriate    Co-evaluation              AM-PAC PT "6 Clicks" Mobility   Outcome Measure  Help needed turning from  your back to your side while in a flat bed without using bedrails?: A Little Help needed moving from lying on your back to sitting on the side of a flat bed without using bedrails?: A Little Help needed moving to and from a bed to a chair (including a wheelchair)?: A Little Help needed standing up from a chair using your arms (e.g., wheelchair or bedside chair)?: A Little Help needed to walk in hospital room?: A Little Help needed climbing 3-5 steps with a railing? : A Lot 6 Click Score: 17    End of Session   Activity Tolerance: Patient tolerated treatment well;Treatment limited secondary to medical complications (Comment) Patient left: in bed;with call bell/phone within reach Nurse Communication: Mobility status PT Visit Diagnosis: Muscle weakness (generalized) (M62.81);Difficulty in walking, not elsewhere classified (R26.2)     Time: [id number] PT Time Calculation (min) (ACUTE ONLY): 36 min  Charges:  $Gait Training: 8-22 mins $Therapeutic Activity: 8-22 mins                     10/17/2020  Kyle Hicks., PT Acute Rehabilitation Services [phone]  (pager) [phone]  (office)   Kyle Hicks 10/17/2020, 3:57 PM

## 2020-10-17 NOTE — Progress Notes (Signed)
   10/16/20 2338  Assess: MEWS Score  BP 108/70  Resp 20  SpO2 96 %  O2 Device Room Air  Assess: MEWS Score  MEWS Temp 0  MEWS Systolic 0  MEWS Pulse 2  MEWS RR 0  MEWS LOC 0  MEWS Score 2  MEWS Score Color Yellow  Assess: if the MEWS score is Yellow or Red  Were vital signs taken at a resting state? Yes  Focused Assessment No change from prior assessment  Early Detection of Sepsis Score *See Row Information* Low  MEWS guidelines implemented *See Row Information* No, previously yellow, continue vital signs every 4 hours  Document  Patient Outcome Stabilized after interventions  Progress note created (see row info) Yes

## 2020-10-17 NOTE — Progress Notes (Signed)
[place] for Xarelto>Heparin Indication: atrial fibrillation  No Known Allergies  Patient Measurements: Height: '5\' 6"'$  (167.6 cm) Weight: 113.7 kg (250 lb 10.6 oz) IBW/kg (Calculated) : 63.8 Heparin Dosing Weight: 89.3 kg  Vital Signs: Temp: 98.3 F (36.8 C) (03/25 0745) Temp Source: Oral (03/25 0745) BP: 106/87 (03/25 0745) Pulse Rate: 113 (03/25 0745)  Labs: Recent Labs    10/15/20 0431 10/15/20 1300 10/16/20 0500 10/16/20 1318 10/16/20 2302 10/17/20 0456  HGB 10.3*  --  10.3* 11.6*  10.9*  --  9.9*  HCT 30.2*  --  30.7* 34.0*  32.0*  --  29.6*  PLT 145*  --  143*  --   --  150  HEPARINUNFRC 0.35  --  0.27*  --  0.30 0.33  CREATININE 3.25* 3.35* 3.48*  --   --  3.52*    Estimated Creatinine Clearance: 20.8 mL/min (A) (by C-G formula based on SCr of 3.52 mg/dL (H)).   Assessment: 80 yo M with new Afib with RVR. No AC noted PTA. Was started on heparin then switched to Xarelto 3/16 and received 1 dose. Pharmacy asked to switch back to IV heparin. Patient now s/p unsuccessful DCCV. Plans to try again once adequately diuresed.  Heparin level remains therapeutic at 0.33 on drip rate 1400 units/hr. Hgb down slightly 9.9, pltc 150. No overt bleeding or infusion issues noted.  Goal of Therapy:  Heparin level 0.3-0.7 units/ml Monitor platelets by anticoagulation protocol: Yes   Plan:  Continue heparin at 1400 units/hr for now Monitor daily HL, CBC, s/sx bleeding F/u [facility] plans post procedures  Richardine Service, PharmD, BCPS PGY2 Cardiology Pharmacy Resident Phone: [phone] 10/17/2020  9:31 AM  Please check [email or web address] for unit-specific pharmacy phone numbers.

## 2020-10-17 NOTE — Progress Notes (Addendum)
[facility] Progress Note    Assessment/ Plan:   Assessment/Recommendations: Kyle Hicks is a/an 80 y.o. 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 Fib/A. Flutter, CKD Stage 3a, T2DM, HTN, obesity.  1. Non-Oliguric AKI: Likely a combination of hypoperfusion secondary to tachycardia and vascular congestion secondary to cardiorenal syndrome. No uremic symptoms however has not had adequate diuresis despite high dose lasix gtt. -Diuretics per Heart Failure team -Appreciate assistance with temp HD line placement 3/24 -Will start with dry ultrafiltration session today. Will check on him tomorrow but tentatively planning for another session tomorrow. -Continue to monitor daily Cr, Dose meds for GFR<15 -Monitor Daily I/Os, Daily weight  -Maintain MAP>65 for optimal renal perfusion.  -Agree with holding ACE-I, avoid further nephrotoxins including NSAIDS, Morphine. Unless absolutely necessary, avoid CT with contrast and/or MRI with gadolinium.  2. 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  UACR initially normal in 2018; markedly elevated in 2021 at 612. Additional differential includes obesity related GN.  Baseline creatinine was previously 1.2 - 1.6.   3. Acute Biventricular Heart Failure: Previous hx of HFpEF with progression to HFrEF with EF of 10-15%; suspected to be in the setting of A. Fib with RVR. Significantly volume overloaded with difficulty diuresing. He is currently on Lasix gtt and Milrinone with Metolazane -HF following, dry ultrafiltration session today as above  4. Hyponatremia (hypervolemic) -ultrafiltration today  5. A. Fib with RVR: Failed DCCV on 3/18. Plan to repeat next week after diuresis. Currently on Amiodarone.   6. Type 2 Diabetes Mellitus: Per chart review, A1c mostly below 7% with the  exception of 1-2 readings since 2014. Follows with Dr. Gae Bon (Endocrinology). Current medication management prior to admission: Glipizide and Sitagliptin-Metformin.  -SSI (resistant)  -per primary  7. Hx of HTN: Previously on HCTZ, Nifedipine and Ramipril. Currently all three are being held. BP has been soft during this admission. 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 At the time, patient refused further evaluation. LFTs WNL on admission. No obvious sequela of cirrhosis on examination. May benefit from repeat imaging. per primary service  9. HLD: Managed with Atoravastatin  Gean Quint, MD [facility]  Subjective:   S/p rhc yesterday, increased biventricular filling pressures therefore temp hd cath placed. Weight down to 113kg today. Patient reports that he still feels the same.  Objective:   BP 105/60 (BP Location: Left Arm)   Pulse (!) 120   Temp 98.6 F (37 C) (Oral)   Resp 18   Ht '5\' 6"'$  (1.676 m)   Wt 113.7 kg   SpO2 96%   BMI 40.46 kg/m   Intake/Output Summary (Last 24 hours) at 10/17/2020 1225 Last data filed at 10/17/2020 1203 Gross per 24 hour  Intake 2398.02 ml  Output 1556 ml  Net 842.02 ml   Weight change: -1 kg  Physical Exam: [id number] elderly gentleman, sitting up in bed [id number] irregularly irregular. No murmurs Resp:No wheezing or rales. No respiratory distress.  Abd:Non-tender to palpation. Abdominal 2+ pitting edema present.  Ext:2+ pitting edema of the lower extremities up the the superior thighs, 1+ pitting edema of the upper extremities.   Imaging: CARDIAC CATHETERIZATION  Result Date: 10/16/2020 Findings: On milrinone 0.25 mcg/kg/min RA = 15 RV = 52/16 PA = 47/23 (33) PCW =  16 Fick cardiac output/index = 6.0/2.7 Thermo CO/CI = 6.9/3.1 PVR = 1.8 WU Ao sat = 98% PA sat = 62%, 63% PaPi = 1.6 Assessment: 1. Elevated biventricular filling pressures with  R>L HF and normal cardiac output Plan/Discussion: He remains markedly volume overloaded with poor response to IV diuresis. HD catheter placed for UF. Glori Bickers, MD 1:51 PM   PERIPHERAL VASCULAR CATHETERIZATION  Result Date: 10/16/2020 Findings: On milrinone 0.25 mcg/kg/min RA = 15 RV = 52/16 PA = 47/23 (33) PCW = 22 Fick cardiac output/index = 6.0/2.7 Thermo CO/CI = 6.9/3.1 PVR = 1.8 WU Ao sat = 98% PA sat = 62%, 63% PaPi = 1.6 Assessment: 1. Elevated biventricular filling pressures with R>L HF and normal cardiac output Plan/Discussion: He remains markedly volume overloaded with poor response to IV diuresis. HD catheter placed for UF. Glori Bickers, MD 1:51 PM   Labs: BMET Recent Labs  Lab 10/12/20 0500 10/13/20 0500 10/14/20 0406 10/15/20 0431 10/15/20 1300 10/16/20 0500 10/16/20 1318 10/17/20 0456  NA 134* 134* 133* 132* 134* 133* 133*  136 127*  K 4.1 3.8 3.8 3.4* 3.8 3.6 3.7  3.4* 3.5  CL 105 105 102 100 101 100  --  93*  CO2 19* 20* 20* 21* 21* 23  --  20*  GLUCOSE 141* 124* 134* 128* 152* 134*  --  318*  BUN 47* 48* 50* 54* 54* 54*  --  52*  CREATININE 2.59* 2.82* 3.02* 3.25* 3.35* 3.48*  --  3.52*  CALCIUM 8.1* 8.1* 8.4* 8.1* 8.2* 8.1*  --  7.7*   CBC Recent Labs  Lab 10/14/20 0406 10/15/20 0431 10/16/20 0500 10/16/20 1318 10/17/20 0456  WBC 5.9 5.2 5.4  --  5.2  HGB 11.0* 10.3* 10.3* 11.6*  10.9* 9.9*  HCT 31.7* 30.2* 30.7* 34.0*  32.0* 29.6*  MCV 83.4 83.0 83.4  --  82.9  PLT 155 145* 143*  --  150    Medications:    . atorvastatin  20 mg Oral Daily  . Chlorhexidine Gluconate Cloth  6 each Topical Q0600  . insulin aspart  0-6 Units Subcutaneous Q4H  . pantoprazole  40 mg Oral Daily  . sodium chloride flush  3 mL Intravenous Q12H  . sodium chloride flush  3 mL Intravenous Q12H  . sodium chloride flush  3 mL Intravenous Q12H   10/17/2020, 12:25 PM

## 2020-10-17 NOTE — Progress Notes (Signed)
PROGRESS NOTE    Kyle Hicks  [id number] DOB: [birth year] DOA: 10/07/2020 PCP: Sharilyn Sites, MD    Brief Narrative:  Kyle Hicks admitted to the hospital working diagnosis of acute on chronic systolic heart failure decompensation, complicated with pulmonary HTN class 2, acute core pulmonale, biventricular failure..  80 year old male past medical history for hypertension, heart failure, type 2 diabetes mellitus and history of GI bleed who presented after mechanical fall. On the day of admission he got up to use the restroom, fell a week and sustained a mechanical fall. No head trauma or loss of consciousness. Reports lower extremity edema, dyspnea, wheezing or palpitations. Positive diarrhea. On his initial physical examination heart rate 129-134, respiratory rate 19-43, blood pressure 105/73-130 8/108, oxygen saturation 98% on supplemental oxygen. His lungs had no wheezing or rales, heart S1-S2, present, rhythmic, tachycardic, abdomen soft, positive lower extremity edema.  Chest radiograph with cardiomegaly, positive hilar vascular congestion, small right pleural effusion.  EKG 133 bpm, left axis deviation, Q waves lead II, lead III, aVF, prolonged QTc545, right bundle branch block,atrial flutter, no ST segment changes, negative T wave V1-V3.  Patient suspected low output heart failure and started on milrinone infusion along with furosemide drip. Amiodarone drip for rate control atrial flutter.   Right heart cath with PCW 22 with PA mean 33 and cardiac output 6.0 per Fick and 6.9 per thermodilution. (on milrinone 0.25 mcg/kg/hr) Right HD IJ HD cathter placed and plan for further fluid removal per ultrafiltration.    Assessment & Plan:   Principal Problem:   Acute CHF (congestive heart failure) (HCC) Active Problems:   Acute kidney injury superimposed on chronic kidney disease (HCC)   Essential hypertension   Elevated d-dimer   Obesity (BMI 30-39.9)   Diarrhea    Atrial flutter (HCC)   Atrial fibrillation ([place])   1. Acute on chronic systolic heart failure/ biventricular failure/ acute core pulmonale/ precapillary pulmonary hypertension, class 2.  LV EF 10 to 15%, global hypokinesis, SV 41 RV systolic severely reduced, RVSP 49,1 mmHg.  Bi-atrial enlargement, severe TR.   Reports further improvement in dyspnea and lower extremity edema, his urine output over last 24 hrs is 0000000 cc, systolic blood pressure 90 to 112 mmHg, serum cr stable at 3,52   Right heart catheterization 03/24: PA mean 33 PCW 22 PVR 1.8 Cardiac output 6.0 (Fick) 6,9 (thermodilution) on milrinone 0,25 mcg/kg/hr.  Continue with furosemide drip 30 mg per hr, milrinone 0.25 mcg/kg/hr and amiodarone.    2. Atrial flutter with RVR/ prolonged QTc. Sp unsuccessful cardioversion 03/18. Continue to have tachycardia, rate 69 to 135 bmp  Continue amiodarone for rate control, anticoagulation with heparin drip.   3. 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  Renal function with serum cr at 3,52, K at 3,5 and bicarbonate at 20, anion gap is 14.  Corrected Na for hyperglycemia is 132.  Urine output is 1,805, ml over last 24 hrs.  Continue diuresis with furosemide, possible ultrafiltration per nephrology recommendations.  Has a HD catheter at the right IJ.  Had 20 meq Kcl yesterday   4. T2DM with dyslipidemia fasting glucose 318, capillary 130, 127 and 159, Continue current insulin regimen, plus sliding scale. Patient is tolerating po well.   Continue with atorvastatin   5. Obesity class 3. Calculated BMI is 40,0  6. HTN. Diuresis with furosemide drip,     Patient continue to be at high risk for worsening heart failure and renal failure.  Status is: Inpatient  Remains inpatient appropriate because:IV treatments appropriate due to intensity of illness or inability to take PO   Dispo: The patient is from: Home               Anticipated d/c is to: Home              Patient currently is not medically stable to d/c.   Difficult to place patient No   DVT prophylaxis: Heparin   Code Status:   full  Family Communication:  No family at the bedside       Consultants:   Cardiology   Nephrology   Procedures:   Right heart cath      Subjective: Patient with improvement of dyspnea and edema, he is out of bed to chair this am, no nausea or vomiting, no chest pain.,   Objective: Vitals:   10/17/20 0034 10/17/20 0359 10/17/20 0637 10/17/20 0745  BP:  96/66 91/66 106/87  Pulse:  97 (!) 117 (!) 113  Resp:  18  18  Temp: 98.5 F (36.9 C) 98.1 F (36.7 C)  98.3 F (36.8 C)  TempSrc: Oral Oral  Oral  SpO2:  99% 95% 95%  Weight: 113.7 kg     Height:        Intake/Output Summary (Last 24 hours) at 10/17/2020 1038 Last data filed at 10/17/2020 0856 Gross per 24 hour  Intake 2241.57 ml  Output 2006 ml  Net 235.57 ml   Filed Weights   10/15/20 0602 10/16/20 0424 10/17/20 0034  Weight: 114.8 kg 114.7 kg 113.7 kg    Examination:   General: Not in pain or dyspnea, deconditioned  Neurology: Awake and alert, non focal  E ENT: mild pallor, no icterus, oral mucosa moist Cardiovascular: No JVD. S1-S2 present, rhythmic, no gallops, rubs, or murmurs. ++ bilateral lower extremity edema. Pulmonary: positive breath sounds bilaterally, with no wheezing, or rhonchi, scattered rales bilaterally at bases. Gastrointestinal. Abdomen soft and non tender Skin. No rashes Musculoskeletal: no joint deformities     Data Reviewed: I have personally reviewed following labs and imaging studies  CBC: Recent Labs  Lab 10/13/20 0500 10/14/20 0406 10/15/20 0431 10/16/20 0500 10/16/20 1318 10/17/20 0456  WBC 5.3 5.9 5.2 5.4  --  5.2  HGB 10.1* 11.0* 10.3* 10.3* 11.6*  10.9* 9.9*  HCT 30.2* 31.7* 30.2* 30.7* 34.0*  32.0* 29.6*  MCV 84.6 83.4 83.0 83.4  --  82.9  PLT 126* 155 145* 143*  --  [id number]    Basic Metabolic Panel: Recent Labs  Lab 10/14/20 0406 10/15/20 0431 10/15/20 1300 10/16/20 0500 10/16/20 1318 10/17/20 0456  NA 133* 132* 134* 133* 133*  136 127*  K 3.8 3.4* 3.8 3.6 3.7  3.4* 3.5  CL 102 100 101 100  --  93*  CO2 20* 21* 21* 23  --  20*  GLUCOSE 134* 128* 152* 134*  --  318*  BUN 50* 54* 54* 54*  --  52*  CREATININE 3.02* 3.25* 3.35* 3.48*  --  3.52*  CALCIUM 8.4* 8.1* 8.2* 8.1*  --  7.7*   GFR: Estimated Creatinine Clearance: 20.8 mL/min (A) (by C-G formula based on SCr of 3.52 mg/dL (H)). Liver Function Tests: No results for input(s): AST, ALT, ALKPHOS, BILITOT, PROT, ALBUMIN in the last 168 hours. No results for input(s): LIPASE, AMYLASE in the last 168 hours. No results for input(s): AMMONIA in the last 168 hours. Coagulation Profile: No results for input(s): INR, PROTIME in the  last 168 hours. Cardiac Enzymes: No results for input(s): CKTOTAL, CKMB, CKMBINDEX, TROPONINI in the last 168 hours. BNP (last 3 results) No results for input(s): PROBNP in the last 8760 hours. HbA1C: No results for input(s): HGBA1C in the last 72 hours. CBG: Recent Labs  Lab 10/16/20 1559 10/16/20 1959 10/16/20 2335 10/17/20 0401 10/17/20 0750  GLUCAP 149* 173* 142* 130* 127*   Lipid Profile: No results for input(s): CHOL, HDL, LDLCALC, TRIG, CHOLHDL, LDLDIRECT in the last 72 hours. Thyroid Function Tests: No results for input(s): TSH, T4TOTAL, FREET4, T3FREE, THYROIDAB in the last 72 hours. Anemia Panel: No results for input(s): VITAMINB12, FOLATE, FERRITIN, TIBC, IRON, RETICCTPCT in the last 72 hours.    Radiology Studies: I have reviewed all of the imaging during this hospital visit personally     Scheduled Meds: . atorvastatin  20 mg Oral Daily  . Chlorhexidine Gluconate Cloth  6 each Topical Q0600  . insulin aspart  0-6 Units Subcutaneous Q4H  . pantoprazole  40 mg Oral Daily  . sodium chloride flush  3 mL Intravenous Q12H  . sodium chloride  flush  3 mL Intravenous Q12H  . sodium chloride flush  3 mL Intravenous Q12H   Continuous Infusions: . sodium chloride    . sodium chloride    . amiodarone 60 mg/hr (10/17/20 0630)  . furosemide (LASIX) 200 mg in dextrose 5% 100 mL ('2mg'$ /mL) infusion 30 mg/hr (10/17/20 0436)  . heparin 1,400 Units/hr (10/17/20 0856)  . milrinone 0.25 mcg/kg/min (10/17/20 [id number])     LOS: 10 days        Felicha Frayne Gerome Apley, MD

## 2020-10-17 NOTE — TOC Progression Note (Signed)
Transition of Care [facility]) - Progression Note    Patient Details  Name: ELBER GOSCINSKI MRN: [id number] Date of Birth: 23-Mar-1941  Transition of Care [facility]) CM/SW [place], [place] Phone Number: 10/17/2020, 3:35 PM  Clinical Narrative:    CSW spoke with NCM regarding patient needing an in-home aid to help with bathing needs. CSW reached out to [facility] and they are able to add [facility] RN and Aid onto his PT home health order. NCM verbally spoke with attending physician Dr. Cathlean Sauer who is aware of face to face orders for PT,RN, and Aid upon discharge. Patients wife Ms. Vivian still would like transportation arranged for Mr. Litterer upon discharge.  TOC will continue to follow for d/c needs.    Expected Discharge Plan: [place] Barriers to Discharge: Continued Medical Work up  Expected Discharge Plan and Services Expected Discharge Plan: [place] In-house Referral: Clinical Social Work Discharge Planning Services: CM Consult Post Acute Care Choice: [place] arrangements for the past 2 months: Single Family Home                 DME Arranged: N/A (Pt. reports none needed) DME Agency: NA       HH Arranged: PT,RN [place]: [facility] [employer] [facility] [employer] Date [place]: 10/10/20 Time [place]: 1150 Representative spoke with at [place]: [place]   Social Determinants of Health ([place]) Interventions    Readmission Risk Interventions No flowsheet data found.  Cortlin Polo, MSW, [place] Heart Failure Social Worker

## 2020-10-17 NOTE — Progress Notes (Signed)
Occupational Therapy Treatment Patient Details Name: Kyle Hicks MRN: [id number] DOB: [birth year] Today's Date: 10/17/2020    History of present illness Pt is a 80 y.o. male admitted 10/07/20 with c/o diarrhea, weakness and fall at home. CXR with enlarged cardiac sillhouette, small R-side pleural effusion. Pt also with tachycardia, HF, AKI. Pt with aflutter RVR 3/17. S/p TEE cardioversion 3/18 showing severe biventricular dysfunction, severe TR, mod-severe MR; DCCV unsuccessful. Course complicated by non-oliguric AKI, likely combo of hypoperfusion secondary to tachycardia and vascular congestion; for renal ultrasound 3/22. Plan RHC and placement of HD cath 3/24. PMH includes HTN, CHF, DM2, CKD IIIa, PUD, GIB.   OT comments  Patient agreeable to OOB activity. Cue patient in bed mobility needing mod verbal cues and min A to upright trunk to sitting. Patient needing total A to don socks, reports spouse would help with this at home sometimes. Slow processing with decreased initiation of sit to stand from elevated bed height possibly due to [facility]. Provide mod cues for use of momentum and hand placement however patient does not follow through. Pt min G to transfer to recliner for g/h tasks. HR at rest in 120s, with transfer up to 140. Cont with POC   Follow Up Recommendations  Home health OT;Supervision/Assistance - 24 hour;Other (comment) (initially)    Equipment Recommendations  3 in 1 bedside commode;Other (comment) (already in room)       Precautions / Restrictions Precautions Precautions: Fall;Other (comment) Precaution Comments: Watch HR (Dr. Haroldine Laws says HR in 140s-150s ok)       Mobility Bed Mobility Overal bed mobility: Needs Assistance Bed Mobility: Rolling;Sidelying to Sit Rolling: Min guard Sidelying to sit: Min assist;HOB elevated       General bed mobility comments: min A to upright trunk and mod cues to push through forearm/elbow to assist upright    Transfers Overall  transfer level: Needs assistance Equipment used: Rolling walker (2 wheeled) Transfers: Sit to/from Stand Sit to Stand: Min guard;From elevated surface         General transfer comment: poor follow through with cues, have patient try to use momentum to stand as well as place hands on bed to push however minimally follows cues. needs increased time before initiating standing    Balance Overall balance assessment: Needs assistance Sitting-balance support: No upper extremity supported;Feet supported Sitting balance-[name] Scale: Fair     Standing balance support: Bilateral upper extremity supported Standing balance-[name] Scale: Poor Standing balance comment: reliant on BUE support                           ADL either performed or assessed with clinical judgement   ADL Overall ADL's : Needs assistance/impaired     Grooming: Wash/dry face;Wash/dry hands;Set up;Sitting Grooming Details (indicate cue type and reason): in recliner, declined brushing his teeth             Lower Body Dressing: Total assistance;Sitting/lateral leans Lower Body Dressing Details (indicate cue type and reason): to don socks Toilet Transfer: Min guard;Cueing for sequencing;Ambulation;RW Toilet Transfer Details (indicate cue type and reason): from elevated bed height, cues for hand placement to push from bed however poor follow through leaning onto walker. patient able to take few steps to recliner HR up to 140         Functional mobility during ADLs: Min guard;Rolling walker General ADL Comments: pt continues to present with impaired balance and tachycardia with minimal exertion  Cognition Arousal/Alertness: Awake/alert Behavior During Therapy: Flat affect Overall Cognitive Status: No family/caregiver present to determine baseline cognitive functioning Area of Impairment: Following commands;Problem solving                       Following Commands: Follows one  step commands with increased time;Follows one step commands inconsistently     Problem Solving: Slow processing;Requires verbal cues General Comments: cues for hand placement however limited follow through, needs increased cues to initiate may be due to [facility]                   Pertinent Vitals/ Pain       Pain Assessment: Faces Faces Pain Scale: No hurt         Frequency  Min 2X/week        Progress Toward Goals  OT Goals(current goals can now be found in the care plan section)  Progress towards OT goals: Progressing toward goals  Acute Rehab OT Goals Patient Stated Goal: To improve strength and return to independence OT Goal Formulation: With patient Time For Goal Achievement: 10/25/20 Potential to Achieve Goals: Good ADL Goals Pt Will Perform Grooming: with modified independence;sitting Pt Will Perform Upper Body Bathing: with modified independence;sitting;with adaptive equipment Pt Will Perform Lower Body Bathing: with modified independence;with adaptive equipment;sitting/lateral leans Pt Will Transfer to Toilet: with modified independence;ambulating Pt Will Perform Toileting - Clothing Manipulation and hygiene: with modified independence;sit to/from stand Additional ADL Goal #1: Pt will verbalize 3 ways of conserving energy during ADL with zero cues  Plan Discharge plan remains appropriate;Frequency remains appropriate       AM-PAC OT "6 Clicks" Daily Activity     Outcome Measure   Help from another person eating meals?: None Help from another person taking care of personal grooming?: A Little Help from another person toileting, which includes using toliet, bedpan, or urinal?: A Lot Help from another person bathing (including washing, rinsing, drying)?: A Lot Help from another person to put on and taking off regular upper body clothing?: A Little Help from another person to put on and taking off regular lower body clothing?: Total 6 Click Score: 15    End  of Session Equipment Utilized During Treatment: Rolling walker  OT Visit Diagnosis: Unsteadiness on feet (R26.81);Other abnormalities of gait and mobility (R26.89);Muscle weakness (generalized) (M62.81);Other symptoms and signs involving cognitive function   Activity Tolerance Patient tolerated treatment well   Patient Left in chair;with call bell/phone within reach;with chair alarm set   Nurse Communication Mobility status        Time: [id number] OT Time Calculation (min): 20 min  Charges: OT General Charges $OT Visit: 1 Visit OT Treatments $Self Care/Home Management : 8-22 mins  Delbert Phenix OT OT pager: [phone]   Rosemary Holms 10/17/2020, 11:58 AM

## 2020-10-17 NOTE — Progress Notes (Signed)
[place] for Xarelto>Heparin Indication: atrial fibrillation  No Known Allergies  Patient Measurements: Height: '5\' 6"'$  (167.6 cm) Weight: 114.7 kg (252 lb 13.9 oz) IBW/kg (Calculated) : 63.8 Heparin Dosing Weight: 89.3 kg  Vital Signs: Temp: 98 F (36.7 C) (03/24 1959) Temp Source: Oral (03/24 2338) BP: 108/70 (03/24 2338) Pulse Rate: 64 (03/24 1959)  Labs: Recent Labs    10/14/20 0406 10/15/20 0431 10/15/20 1300 10/16/20 0500 10/16/20 1318 10/16/20 2302  HGB 11.0* 10.3*  --  10.3* 11.6*  10.9*  --   HCT 31.7* 30.2*  --  30.7* 34.0*  32.0*  --   PLT 155 145*  --  143*  --   --   HEPARINUNFRC 0.42 0.35  --  0.27*  --  0.30  CREATININE 3.02* 3.25* 3.35* 3.48*  --   --     Estimated Creatinine Clearance: 21.2 mL/min (A) (by C-G formula based on SCr of 3.48 mg/dL (H)).   Assessment: 80 yo M with new Afib with RVR. No AC noted PTA. Was started on heparin then switched to Xarelto 3/16 and received 1 dose. Pharmacy asked to switch back to IV heparin. Patient now s/p unsuccessful DCCV. Plans to try again this week if adequately diuresed.  Heparin level slightly subtherapeutic at 0.27, on drip rate 1400 units/hr although he has been therapeutic on this drip rate for many days. CBC stable. No overt bleeding or infusion issues noted. Plans noted for RHC today with HD catheter placement - will hold on heparin adjustments for now.  3/25 AM update:  Heparin level at goal after re-start  Goal of Therapy:  Heparin level 0.3-0.7 units/ml Monitor platelets by anticoagulation protocol: Yes   Plan:  Cont heparin 1400 units/hr Confirmatory heparin level with AM labs  Narda Bonds, PharmD, [place] Pharmacist Phone: [phone]

## 2020-10-17 NOTE — Progress Notes (Signed)
   10/17/20 0034  Assess: MEWS Score  Temp 98.5 F (36.9 C)  Assess: MEWS Score  MEWS Temp 0  MEWS Systolic 0  MEWS Pulse 2  MEWS RR 0  MEWS LOC 0  MEWS Score 2  MEWS Score Color Yellow  Assess: if the MEWS score is Yellow or Red  Were vital signs taken at a resting state? Yes  Focused Assessment No change from prior assessment  Early Detection of Sepsis Score *See Row Information* Low  MEWS guidelines implemented *See Row Information* No, previously yellow, continue vital signs every 4 hours  Document  Patient Outcome Stabilized after interventions  Progress note created (see row info) Yes

## 2020-10-17 NOTE — Progress Notes (Addendum)
Advanced Heart Failure Rounding Note  PCP-Cardiologist: No primary care provider on file.    Patient Profile   80 y/o male with obesity and HTN. Admitted with acute HF in setting of AF with RVR of unknown duration.   Echo LVEF 10-15%. RV severely reduced. PICC placed with initial co-ox 40% suggesting markedly low output. CVP up  Subjective:    TEE 3/18 w/ severe Biv dysfunction, Mod-severe MR, severe TR. No LAA thrombus. DCCV unsuccessful  3/24 Diuresing with lasix drip and poor response.  RHC with elevated filling pressures R>L HF , CO 6/CI 2.7. Creatinine 3.5  Temp HD cath placed.   Remains on Amio gtt at 60/hr.+ lasix drip at 30 mg /hour. Making >1.8 liters urine.   Creatinine 3.5>3.5  On Milrinone 0.25. Co-ox 63.5%.     Feels ok. Denies SOB   Objective:   Weight Range: 113.7 kg Body mass index is 40.46 kg/m.   Vital Signs:   Temp:  [97.3 F (36.3 C)-98.6 F (37 C)] 98.6 F (37 C) (03/25 1135) Pulse Rate:  [0-131] 120 (03/25 1135) Resp:  [0-22] 18 (03/25 1135) BP: (91-128)/(60-87) 105/60 (03/25 1135) SpO2:  [0 %-100 %] 96 % (03/25 1135) Weight:  [113.7 kg] 113.7 kg (03/25 0034) Last BM Date: 10/17/20  Weight change: Filed Weights   10/15/20 0602 10/16/20 0424 10/17/20 0034  Weight: 114.8 kg 114.7 kg 113.7 kg    Intake/Output:   Intake/Output Summary (Last 24 hours) at 10/17/2020 1226 Last data filed at 10/17/2020 1203 Gross per 24 hour  Intake 2398.02 ml  Output 1556 ml  Net 842.02 ml      Physical Exam   CVP 12-13  General:   No resp difficulty HEENT: normal Neck: supple. JVP 12-13 . Carotids 2+ bilat; no bruits. No lymphadenopathy or thryomegaly appreciated. RIJ HD catheter.  Cor: PMI nondisplaced. Irregular rate & rhythm. No rubs, gallops or murmurs. Lungs: clear Abdomen: soft, nontender, nondistended. No hepatosplenomegaly. No bruits or masses. Good bowel sounds. Extremities: no cyanosis, clubbing, rash, R and LLE 2+ with unna  boots/edema Neuro: alert & orientedx3, cranial nerves grossly intact. moves all 4 extremities w/o difficulty. Affect pleasant  Telemetry   A FIb RVR 110-120s   Labs    CBC Recent Labs    10/16/20 0500 10/16/20 1318 10/17/20 0456  WBC 5.4  --  5.2  HGB 10.3* 11.6*  10.9* 9.9*  HCT 30.7* 34.0*  32.0* 29.6*  MCV 83.4  --  82.9  PLT 143*  --  [id number]   Basic Metabolic Panel Recent Labs    10/16/20 0500 10/16/20 1318 10/17/20 0456  NA 133* 133*  136 127*  K 3.6 3.7  3.4* 3.5  CL 100  --  93*  CO2 23  --  20*  GLUCOSE 134*  --  318*  BUN 54*  --  52*  CREATININE 3.48*  --  3.52*  CALCIUM 8.1*  --  7.7*   Liver Function Tests No results for input(s): AST, ALT, ALKPHOS, BILITOT, PROT, ALBUMIN in the last 72 hours. No results for input(s): LIPASE, AMYLASE in the last 72 hours. Cardiac Enzymes No results for input(s): CKTOTAL, CKMB, CKMBINDEX, TROPONINI in the last 72 hours.  BNP: BNP (last 3 results) Recent Labs    10/07/20 0229  BNP 4,299.0*    ProBNP (last 3 results) No results for input(s): PROBNP in the last 8760 hours.   D-Dimer No results for input(s): DDIMER in the last 72 hours. Hemoglobin A1C  No results for input(s): HGBA1C in the last 72 hours. Fasting Lipid Panel No results for input(s): CHOL, HDL, LDLCALC, TRIG, CHOLHDL, LDLDIRECT in the last 72 hours. Thyroid Function Tests No results for input(s): TSH, T4TOTAL, T3FREE, THYROIDAB in the last 72 hours.  Invalid input(s): FREET3  Other results:   Imaging    CARDIAC CATHETERIZATION  Result Date: 10/16/2020 Findings: On milrinone 0.25 mcg/kg/min RA = 15 RV = 52/16 PA = 47/23 (33) PCW = 22 Fick cardiac output/index = 6.0/2.7 Thermo CO/CI = 6.9/3.1 PVR = 1.8 WU Ao sat = 98% PA sat = 62%, 63% PaPi = 1.6 Assessment: 1. Elevated biventricular filling pressures with R>L HF and normal cardiac output Plan/Discussion: He remains markedly volume overloaded with poor response to IV diuresis. HD catheter  placed for UF. Glori Bickers, MD 1:51 PM   PERIPHERAL VASCULAR CATHETERIZATION  Result Date: 10/16/2020 Findings: On milrinone 0.25 mcg/kg/min RA = 15 RV = 52/16 PA = 47/23 (33) PCW = 22 Fick cardiac output/index = 6.0/2.7 Thermo CO/CI = 6.9/3.1 PVR = 1.8 WU Ao sat = 98% PA sat = 62%, 63% PaPi = 1.6 Assessment: 1. Elevated biventricular filling pressures with R>L HF and normal cardiac output Plan/Discussion: He remains markedly volume overloaded with poor response to IV diuresis. HD catheter placed for UF. Glori Bickers, MD 1:51 PM     Medications:     Scheduled Medications: . atorvastatin  20 mg Oral Daily  . Chlorhexidine Gluconate Cloth  6 each Topical Q0600  . insulin aspart  0-6 Units Subcutaneous Q4H  . pantoprazole  40 mg Oral Daily  . sodium chloride flush  3 mL Intravenous Q12H  . sodium chloride flush  3 mL Intravenous Q12H  . sodium chloride flush  3 mL Intravenous Q12H    Infusions: . sodium chloride    . sodium chloride    . amiodarone 60 mg/hr (10/17/20 0630)  . furosemide (LASIX) 200 mg in dextrose 5% 100 mL ('2mg'$ /mL) infusion 30 mg/hr (10/17/20 1139)  . heparin 1,400 Units/hr (10/17/20 0856)  . milrinone 0.25 mcg/kg/min (10/17/20 0628)    PRN Medications: sodium chloride, sodium chloride, acetaminophen, acetaminophen, albuterol, dextrose, hydrocortisone cream, ondansetron (ZOFRAN) IV, ondansetron (ZOFRAN) IV, sodium chloride flush, sodium chloride flush, sodium chloride flush  Assessment/Plan   1. A Flutter RVR - Started on amiodarone drip. Remains tachycardic with rates > 110-130 - Failed attempt at DCCV 3/18 - Remains in A fib RVR.  - Continue IV amiodarone load, repeat DCCV after better diuresed - TSH normal - Continue IV heparin  2. 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  - Echo this admit w/ severe biventricular HF with EF < 15%.  BNP > 4000 . Suspect tachy-mediated. HS Trop - no trend. No chest pain. No LHC  for now. Could consider ischemic work up later if renal function improves. Susepct he may need RHC  - PICC placed initial Co-ox 40%. Started Milrinone 0.25 - Had [place] 3/24 with elevated filling pressures R>L and preserved cardiac output.  - CO-OX stable. Continue milrinone 0.25 mcg.  - CVP 12-13. Continue lasix drip. HD catheter in place.  - No bb w/ low output  - No arb/dig/spiro with AKI  - c/w unna boots.   3. AKI on CKD Stage IIIa - Creatinine on admit 2.8.  Most recent creatinine was 1.55 back in 03/2020. - started on milrinone for low output - Creatinine 3.5>3.5 unchanged.  - Has temporary HD cath .  Plan for HD today.  - Nephrology appreciate their assistance   4. DMII - Hgb A1C 5.9  - On SSI  5. Lower Extremity Edema - Dopplers negative for DVT - 2/2 CHF Continue diuretics + unna boots   6. 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  -Continue PPI  7. HTN  -Soft.   8. Hypervolemic Hyponatremia - Na 127 Restrict free water.   Length of Stay: [place], NP  10/17/2020, 12:26 PM  Advanced Heart Failure Team Pager [phone] (M-F; 7a - 5p)   Patient seen and examined with the above-signed Advanced Practice Provider and/or Housestaff. I personally reviewed laboratory data, imaging studies and relevant notes. I independently examined the patient and formulated the important aspects of the plan. I have edited the note to reflect any of my changes or salient points. I have personally discussed the plan with the patient and/or family.  Remains markedly volume overloaded today. Not diuresing well. Creatinine continues to climb slowly. Co-ox 64% K low. HD cath placed yesterday.   General:  Lying flat in bed No resp difficulty HEENT: normal Neck: supple. RIJ HD cath Carotids 2+ bilat; no bruits. No lymphadenopathy or thryomegaly appreciated. Cor: PMI nondisplaced.Irreg tachy  No rubs, gallops or murmurs. Lungs: clear Abdomen: obese soft, nontender, nondistended. No  hepatosplenomegaly. No bruits or masses. Good bowel sounds. Extremities: no cyanosis, clubbing, rash, 3+ edema Neuro: alert & orientedx3, cranial nerves grossly intact. moves all 4 extremities w/o difficulty. Affect pleasant  Co-ox ok on milrinone. He is massively volume overloaded. Not responding to high-dose lasix. D/w Renal. Will plan UF today. Will need repeat DC-CV next week when fluid improved. Continue heparin and IV amio.  Glori Bickers, MD  2:23 PM

## 2020-10-17 NOTE — Plan of Care (Signed)

## 2020-10-17 NOTE — TOC Progression Note (Signed)
Transition of Care (TOC) - Progression Note  Heart Failure   Patient Details  Name: KYSEEM LYNSKEY MRN: [id number] Date of Birth: 11/12/1940  Transition of Care [facility]) CM/SW [place], [place] Phone Number: 10/17/2020, 9:10 AM  Clinical Narrative:    CSW reached out to the patients wife to discuss about transportation when the patient is medically ready to be discharged.  Mrs. Pach reported that she would like for transportation to be arranged and for the equipment that was delivered to the room to go with him home once transported. Mrs. Galinski reported she would like the walker to have wheels if possible. CSW will verify if equipment has been delivered.  TOC will continue to follow for d/c needs.   Expected Discharge Plan: [place] Barriers to Discharge: Continued Medical Work up  Expected Discharge Plan and Services Expected Discharge Plan: [place] In-house Referral: Clinical Social Work Discharge Planning Services: CM Consult Post Acute Care Choice: [place] arrangements for the past 2 months: Single Family Home                 DME Arranged: N/A (Pt. reports none needed) DME Agency: NA       HH Arranged: PT,RN [place]: [facility] [facility] [employer] Date [place]: 10/10/20 Time [place]: 1150 Representative spoke with at [place]: [place]   Social Determinants of Health ([place]) Interventions    Readmission Risk Interventions No flowsheet data found.  Aubreanna Percle, MSW, [place] Heart Failure Social Worker

## 2020-10-17 NOTE — Progress Notes (Signed)
   10/16/20 2200  Assess: MEWS Score  Resp 20  O2 Device Room Air  Assess: MEWS Score  MEWS Temp 0  MEWS Systolic 0  MEWS Pulse 2  MEWS RR 0  MEWS LOC 0  MEWS Score 2  MEWS Score Color Yellow  Assess: if the MEWS score is Yellow or Red  Were vital signs taken at a resting state? Yes  Focused Assessment No change from prior assessment  Early Detection of Sepsis Score *See Row Information* Low  MEWS guidelines implemented *See Row Information* No, previously yellow, continue vital signs every 4 hours  Document  Patient Outcome Stabilized after interventions  Progress note created (see row info) Yes

## 2020-10-18 DIAGNOSIS — I5041 Acute combined systolic (congestive) and diastolic (congestive) heart failure: Secondary | ICD-10-CM | POA: Diagnosis not present

## 2020-10-18 DIAGNOSIS — I5043 Acute on chronic combined systolic (congestive) and diastolic (congestive) heart failure: Secondary | ICD-10-CM

## 2020-10-18 DIAGNOSIS — N189 Chronic kidney disease, unspecified: Secondary | ICD-10-CM | POA: Diagnosis not present

## 2020-10-18 DIAGNOSIS — I484 Atypical atrial flutter: Secondary | ICD-10-CM | POA: Diagnosis not present

## 2020-10-18 DIAGNOSIS — I4819 Other persistent atrial fibrillation: Secondary | ICD-10-CM | POA: Diagnosis not present

## 2020-10-18 DIAGNOSIS — N179 Acute kidney failure, unspecified: Secondary | ICD-10-CM | POA: Diagnosis not present

## 2020-10-18 LAB — CBC
HCT: 27.7 % — ABNORMAL LOW (ref 39.0–52.0)
Hemoglobin: 9.5 g/dL — ABNORMAL LOW (ref 13.0–17.0)
MCH: 28.4 pg (ref 26.0–34.0)
MCHC: 34.3 g/dL (ref 30.0–36.0)
MCV: 82.7 fL (ref 80.0–100.0)
Platelets: 126 10*3/uL — ABNORMAL LOW (ref 150–400)
RBC: 3.35 MIL/uL — ABNORMAL LOW (ref 4.22–5.81)
RDW: 18.6 % — ABNORMAL HIGH (ref 11.5–15.5)
WBC: 4.8 10*3/uL (ref 4.0–10.5)
nRBC: 0 % (ref 0.0–0.2)

## 2020-10-18 LAB — HEPARIN LEVEL (UNFRACTIONATED): Heparin Unfractionated: 0.58 IU/mL (ref 0.30–0.70)

## 2020-10-18 LAB — COOXEMETRY PANEL
Carboxyhemoglobin: 1.1 % (ref 0.5–1.5)
Methemoglobin: 0.9 % (ref 0.0–1.5)
O2 Saturation: 73.3 %
Total hemoglobin: 9.9 g/dL — ABNORMAL LOW (ref 12.0–16.0)

## 2020-10-18 LAB — GLUCOSE, CAPILLARY
Glucose-Capillary: 113 mg/dL — ABNORMAL HIGH (ref 70–99)
Glucose-Capillary: 118 mg/dL — ABNORMAL HIGH (ref 70–99)
Glucose-Capillary: 143 mg/dL — ABNORMAL HIGH (ref 70–99)
Glucose-Capillary: 156 mg/dL — ABNORMAL HIGH (ref 70–99)
Glucose-Capillary: 134 mg/dL — ABNORMAL HIGH (ref 70–99)

## 2020-10-18 LAB — BASIC METABOLIC PANEL
Anion gap: 12 (ref 5–15)
BUN: 31 mg/dL — ABNORMAL HIGH (ref 8–23)
CO2: 24 mmol/L (ref 22–32)
Calcium: 7.7 mg/dL — ABNORMAL LOW (ref 8.9–10.3)
Chloride: 94 mmol/L — ABNORMAL LOW (ref 98–111)
Creatinine, Ser: 2.72 mg/dL — ABNORMAL HIGH (ref 0.61–1.24)
GFR, Estimated: 23 mL/min — ABNORMAL LOW (ref >60)
Glucose, Bld: 275 mg/dL — ABNORMAL HIGH (ref 70–99)
Potassium: 3.1 mmol/L — ABNORMAL LOW (ref 3.5–5.1)
Sodium: 130 mmol/L — ABNORMAL LOW (ref 135–145)

## 2020-10-18 LAB — HEPATITIS B SURFACE ANTIGEN: Hepatitis B Surface Ag: NONREACTIVE

## 2020-10-18 MED ORDER — POTASSIUM CHLORIDE CRYS ER 20 MEQ PO TBCR
40.0000 meq | EXTENDED_RELEASE_TABLET | ORAL | Status: DC
  Administered 2020-10-18: 40 meq via ORAL
  Filled 2020-10-18: qty 2

## 2020-10-18 MED ORDER — ALBUMIN HUMAN 25 % IV SOLN
INTRAVENOUS | Status: AC
  Administered 2020-10-18: 25 g via INTRAVENOUS
  Filled 2020-10-18: qty 100

## 2020-10-18 MED ORDER — INSULIN GLARGINE 100 UNIT/ML ~~LOC~~ SOLN
10.0000 [IU] | Freq: Every day | SUBCUTANEOUS | Status: DC
  Administered 2020-10-18 – 2020-10-27 (×10): 10 [IU] via SUBCUTANEOUS
  Filled 2020-10-18 (×10): qty 0.1

## 2020-10-18 MED ORDER — HEPARIN SODIUM (PORCINE) 1000 UNIT/ML IJ SOLN
INTRAMUSCULAR | Status: AC
  Administered 2020-10-18: 1000 [IU]
  Filled 2020-10-18: qty 3

## 2020-10-18 MED ORDER — HEPARIN SODIUM (PORCINE) 1000 UNIT/ML IJ SOLN
INTRAMUSCULAR | Status: AC
  Filled 2020-10-18: qty 3

## 2020-10-18 MED ORDER — ALBUMIN HUMAN 25 % IV SOLN: 25.0000 g | Freq: Once | INTRAVENOUS | Status: AC

## 2020-10-18 NOTE — Progress Notes (Signed)
[place] for Xarelto>Heparin Indication: atrial fibrillation  No Known Allergies  Patient Measurements: Height: '5\' 6"'$  (167.6 cm) Weight: 110.8 kg (244 lb 4.3 oz) IBW/kg (Calculated) : 63.8 Heparin Dosing Weight: 89.3 kg  Vital Signs: Temp: 98.3 F (36.8 C) (03/26 0423) Temp Source: Oral (03/26 0423) BP: 101/67 (03/26 0423) Pulse Rate: 130 (03/26 0423)  Labs: Recent Labs    10/16/20 0500 10/16/20 1318 10/16/20 2302 10/17/20 0456 10/18/20 0500  HGB 10.3* 11.6*  10.9*  --  9.9* 9.5*  HCT 30.7* 34.0*  32.0*  --  29.6* 27.7*  PLT 143*  --   --  150 126*  HEPARINUNFRC 0.27*  --  0.30 0.33 0.58  CREATININE 3.48*  --   --  3.52* 2.72*    Estimated Creatinine Clearance: 26.6 mL/min (A) (by C-G formula based on SCr of 2.72 mg/dL (H)).   Assessment: 80 yo M with new Afib with RVR. No AC noted PTA. Was started on heparin then switched to Xarelto 3/16 and received 1 dose. Pharmacy asked to switch back to IV heparin. Patient now s/p unsuccessful DCCV. Plans to try again once adequately diuresed.  Heparin level remains therapeutic at 0.58 on drip rate 1400 units/hr but increased significantly. Hgb down slightly 9.5, pltc 126. Per RN, no bleeding or infusion issues noted.  Goal of Therapy:  Heparin level 0.3-0.7 units/ml Monitor platelets by anticoagulation protocol: Yes   Plan:  Decrease heparin to 1350 units/hr  Monitor daily HL, CBC, s/sx bleeding  Romilda Garret, PharmD PGY1 Acute Care Pharmacy Resident 10/18/2020 7:09 AM  Please check [email or web address] for unit specific pharmacy phone numbers.

## 2020-10-18 NOTE — Progress Notes (Signed)
[facility] Progress Note    Assessment/ Plan:   Assessment/Recommendations: Kyle Hicks is a/an 80 y.o. 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 Fib/A. Flutter, CKD Stage 3a, T2DM, HTN, obesity.  1. Non-Oliguric AKI: Likely a combination of hypoperfusion secondary to tachycardia and vascular congestion secondary to cardiorenal syndrome. No uremic symptoms however has not had adequate diuresis despite high dose lasix gtt. -Diuretics per Heart Failure team -Appreciate assistance with temp HD line placement 3/24 -on milrinone -Dry UF session 3/25 and responded very well. S/p 3L UF, weight is down by 3kg today.  Repeat session today -Continue to monitor daily Cr, Dose meds for GFR<15 -Monitor Daily I/Os, Daily weight  -Maintain MAP>65 for optimal renal perfusion.  -Agree with holding ACE-I, avoid further nephrotoxins including NSAIDS, Morphine. Unless absolutely necessary, avoid CT with contrast and/or MRI with gadolinium.  2. 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  UACR initially normal in 2018; markedly elevated in 2021 at 612. Additional differential includes obesity related GN.  Baseline creatinine was previously 1.2 - 1.6.   3. Acute Biventricular Heart Failure: Previous hx of HFpEF with progression to HFrEF with EF of 10-15%; suspected to be in the setting of A. Fib with RVR. Significantly volume overloaded with difficulty diuresing. He is currently on Lasix gtt and Milrinone with Metolazane -HF following, dry ultrafiltration session again today as above, aiming for another 3L  4. Hyponatremia (hypervolemic) -improved with UF  5. A. Fib with RVR: Failed DCCV on 3/18. Plan to repeat next week after diuresis. Currently on Amiodarone.   6. Type 2 Diabetes Mellitus: -per primary  7. Hx of HTN:  Previously on HCTZ, Nifedipine and Ramipril. Currently all three are being held. BP has been soft during this admission. 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 At that time, patient refused further evaluation. LFTs WNL on admission.  Per primary  Gean Quint, MD [facility]  Subjective:   S/p DUF yesterday, net UF 3020 cc.  Patient reports that he feels better in regards to his breathing and swallowing.  Weight is from 113.7 kg down 110.8 kg.  Urine output 775 cc.  Objective:   BP 126/88 (BP Location: Left Arm)   Pulse (!) 123   Temp 98.7 F (37.1 C) (Oral)   Resp 20   Ht '5\' 6"'$  (1.676 m)   Wt 110.8 kg   SpO2 100%   BMI 39.43 kg/m   Intake/Output Summary (Last 24 hours) at 10/18/2020 1306 Last data filed at 10/18/2020 1300 Gross per 24 hour  Intake 2059.42 ml  Output 3995 ml  Net -1935.58 ml   Weight change: -2.9 kg  Physical Exam: [id number] elderly gentleman, sitting up in bed CVS: irregularly irregular. No murmurs Resp:No wheezing or rales. No respiratory distress. cta bl Abd: obese, soft, nt  Ext: 1+ pitting edema of the lower extremities up to knees (softer) Neuro: awake, interactive, speech clear and coherent  Imaging: CARDIAC CATHETERIZATION  Result Date: 10/16/2020 Findings: On milrinone 0.25 mcg/kg/min RA = 15 RV = 52/16 PA = 47/23 (33) PCW = 22 Fick cardiac output/index = 6.0/2.7 Thermo CO/CI = 6.9/3.1 PVR = 1.8 WU Ao sat = 98% PA sat = 62%, 63% PaPi = 1.6 Assessment: 1. Elevated biventricular filling pressures with R>L HF  and normal cardiac output Plan/Discussion: He remains markedly volume overloaded with poor response to IV diuresis. HD catheter placed for UF. Glori Bickers, MD 1:51 PM   PERIPHERAL VASCULAR CATHETERIZATION  Result Date: 10/16/2020 Findings: On milrinone 0.25 mcg/kg/min RA = 15 RV = 52/16 PA = 47/23 (33) PCW = 22 Fick cardiac output/index = 6.0/2.7  Thermo CO/CI = 6.9/3.1 PVR = 1.8 WU Ao sat = 98% PA sat = 62%, 63% PaPi = 1.6 Assessment: 1. Elevated biventricular filling pressures with R>L HF and normal cardiac output Plan/Discussion: He remains markedly volume overloaded with poor response to IV diuresis. HD catheter placed for UF. Glori Bickers, MD 1:51 PM   Labs: BMET Recent Labs  Lab 10/13/20 0500 10/14/20 0406 10/15/20 0431 10/15/20 1300 10/16/20 0500 10/16/20 1318 10/17/20 0456 10/18/20 0500  NA 134* 133* 132* 134* 133* 133*  136 127* 130*  K 3.8 3.8 3.4* 3.8 3.6 3.7  3.4* 3.5 3.1*  CL 105 102 100 101 100  --  93* 94*  CO2 20* 20* 21* 21* 23  --  20* 24  GLUCOSE 124* 134* 128* 152* 134*  --  318* 275*  BUN 48* 50* 54* 54* 54*  --  52* 31*  CREATININE 2.82* 3.02* 3.25* 3.35* 3.48*  --  3.52* 2.72*  CALCIUM 8.1* 8.4* 8.1* 8.2* 8.1*  --  7.7* 7.7*   CBC Recent Labs  Lab 10/15/20 0431 10/16/20 0500 10/16/20 1318 10/17/20 0456 10/18/20 0500  WBC 5.2 5.4  --  5.2 4.8  HGB 10.3* 10.3* 11.6*  10.9* 9.9* 9.5*  HCT 30.2* 30.7* 34.0*  32.0* 29.6* 27.7*  MCV 83.0 83.4  --  82.9 82.7  PLT 145* 143*  --  150 126*    Medications:    . atorvastatin  20 mg Oral Daily  . Chlorhexidine Gluconate Cloth  6 each Topical Q0600  . heparin sodium (porcine)      . insulin aspart  0-6 Units Subcutaneous Q4H  . insulin glargine  10 Units Subcutaneous Daily  . pantoprazole  40 mg Oral Daily  . sodium chloride flush  3 mL Intravenous Q12H  . sodium chloride flush  3 mL Intravenous Q12H  . sodium chloride flush  3 mL Intravenous Q12H   10/18/2020, 1:06 PM

## 2020-10-18 NOTE — Plan of Care (Signed)
  Problem: Cardiac: Goal: Ability to achieve and maintain adequate cardiopulmonary perfusion will improve Outcome: Progressing   Problem: Education: Goal: Knowledge of General Education information will improve Description: Including pain rating scale, medication(s)/side effects and non-pharmacologic comfort measures Outcome: Progressing   

## 2020-10-18 NOTE — Progress Notes (Addendum)
PROGRESS NOTE    Kyle Hicks  [id number] DOB: [birth year] DOA: 10/07/2020 PCP: Sharilyn Sites, MD    Brief Narrative:  Kyle Hicks admitted to the hospital with the working diagnosis of acute on chronic systolic heart failure decompensation, complicated with pulmonary HTN class 2, acute core pulmonale, biventricular failure..  80 year old male past medical history for hypertension, heart failure, type 2 diabetes mellitus and history of GI bleed who presented after mechanical fall. On the day of admission he got up to use the restroom, fell a week and sustained a mechanical fall. No head trauma or loss of consciousness. Reports lower extremity edema, dyspnea, wheezing or palpitations. Positive diarrhea. On his initial physical examination heart rate 129-134, respiratory rate 19-43, blood pressure 105/73-130 8/108, oxygen saturation 98% on supplemental oxygen. His lungs had no wheezing or rales, heart S1-S2, present, rhythmic, tachycardic, abdomen soft, positive lower extremity edema.  Chest radiograph with cardiomegaly, positive hilar vascular congestion, small right pleural effusion.  EKG 133 bpm, left axis deviation, Q waves lead II, lead III, aVF, prolonged QTc545, right bundle branch block,atrial flutter, no ST segment changes, negative T wave V1-V3.  Patientsuspected low output heart failure andstarted on milrinone infusion along with furosemide drip. Amiodarone drip for rate control atrial flutter.  Right heart cath with PCW 22 with PA mean 33 and cardiac output 6.0 per Fick and 6.9 per thermodilution. (on milrinone 0.25 mcg/kg/hr) Right HD IJ HD cathter placed and plan for further fluid removal per ultrafiltration.  Underwent ultrafiltration 03/25 (3 L) with good toleration.    Assessment & Plan:   Principal Problem:   Acute CHF (congestive heart failure) (HCC) Active Problems:   Acute kidney injury superimposed on chronic kidney disease (HCC)   Essential  hypertension   Elevated d-dimer   Obesity (BMI 30-39.9)   Diarrhea   Atrial flutter (HCC)   Atrial fibrillation ([place])   1. Acute on chronic systolic heart failure/ biventricular failure/ acute core pulmonale/ precapillary pulmonary hypertension, class 2.  LV EF 10 to 15%, global hypokinesis, SV 41 RV systolic severely reduced, RVSP 49,1 mmHg.  Bi-atrial enlargement, severe TR.  Right heart catheterization 03/24: PA mean 33 PCW 22 PVR 1.8 Cardiac output 6.0 (Fick) 6,9 (thermodilution) on milrinone 0,25 mcg/kg/hr.  Volume continue with improvement, tolerated well ultrafiltration yesterday 3 L removed, today will have another session of ultrafiltration attempt another 3 L.   On furosemide drip 30 mg per hr, milrinone 0.25 mcg/kg/hr and amiodarone.  Continue with milrinone infusion.   2. Atrial flutter with RVR/ prolonged QTc. Sp unsuccessful cardioversion 03/18. Persistent uncontrolled heart rate, on amiodarone drip and anticoagulation with heparin.   Plan to repeat cardioversion next week.   3. 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  Tolerating well ultrafiltration. Serum cr today is 2,72 with K at 3,1 and bicarbonate at 24. NA 130 and Cl 94.   Added 40 meq KCl po today for hypokalemia Continue to follow up on renal function in am.   4. T2DM with dyslipidemia fasting glucose 275. Add basal insulin 10 units today and continue with insulin sliding scale for glucose cover and monitoring.   On atorvastatin   5. Obesity class 3. Calculated BMI is 40,0  6. HTN.Continue with aggressive diuresis and fluid removal with ultrafiltration   Patient continue to be at high risk for worsening heart failure   Status is: Inpatient  Remains inpatient appropriate because:IV treatments appropriate due to intensity of illness or inability to take  PO   Dispo: The patient is from: Home              Anticipated d/c is to:  Home              Patient currently is not medically stable to d/c.   Difficult to place patient No   DVT prophylaxis: Heparin drip   Code Status:    full  Family Communication:  No family at the bedside     Consultants:   Cardiology   Nephrology     Subjective: Patient is feeling better, dyspnea and edema are improving but not yet back to baseline, no nausea or vomiting.,   Objective: Vitals:   10/18/20 0256 10/18/20 0412 10/18/20 0423 10/18/20 0747  BP: 108/79 101/67 101/67 98/61  Pulse: (!) 127 (!) 130 (!) 130 93  Resp: '18 18 18   '$ Temp: 97.6 F (36.4 C) 98.3 F (36.8 C) 98.3 F (36.8 C) 98.5 F (36.9 C)  TempSrc: Oral Oral Oral Oral  SpO2: 97% 96%  98%  Weight:   110.8 kg   Height:        Intake/Output Summary (Last 24 hours) at 10/18/2020 1124 Last data filed at 10/18/2020 0813 Gross per 24 hour  Intake 2016.43 ml  Output 3595 ml  Net -1578.57 ml   Filed Weights   10/16/20 0424 10/17/20 0034 10/18/20 0423  Weight: 114.7 kg 113.7 kg 110.8 kg    Examination:   General: Not in pain, no dyspnea at rest.  Neurology: Awake and alert, non focal  E ENT: no pallor, no icterus, oral mucosa moist Cardiovascular: No JVD. S1-S2 present, rhythmic, no gallops, rubs, or murmurs. ++ bilateral lower extremity edema, unna boots in place.  Pulmonary: positive breath sounds bilaterally,with no wheezing, or rhonchi, bilateral rales. Gastrointestinal. Abdomen soft and non tender Skin. No rashes Musculoskeletal: no joint deformities     Data Reviewed: I have personally reviewed following labs and imaging studies  CBC: Recent Labs  Lab 10/14/20 0406 10/15/20 0431 10/16/20 0500 10/16/20 1318 10/17/20 0456 10/18/20 0500  WBC 5.9 5.2 5.4  --  5.2 4.8  HGB 11.0* 10.3* 10.3* 11.6*  10.9* 9.9* 9.5*  HCT 31.7* 30.2* 30.7* 34.0*  32.0* 29.6* 27.7*  MCV 83.4 83.0 83.4  --  82.9 82.7  PLT 155 145* 143*  --  150 123XX123*   Basic Metabolic Panel: Recent Labs  Lab  10/15/20 0431 10/15/20 1300 10/16/20 0500 10/16/20 1318 10/17/20 0456 10/18/20 0500  NA 132* 134* 133* 133*  136 127* 130*  K 3.4* 3.8 3.6 3.7  3.4* 3.5 3.1*  CL 100 101 100  --  93* 94*  CO2 21* 21* 23  --  20* 24  GLUCOSE 128* 152* 134*  --  318* 275*  BUN 54* 54* 54*  --  52* 31*  CREATININE 3.25* 3.35* 3.48*  --  3.52* 2.72*  CALCIUM 8.1* 8.2* 8.1*  --  7.7* 7.7*   GFR: Estimated Creatinine Clearance: 26.6 mL/min (A) (by C-G formula based on SCr of 2.72 mg/dL (H)). Liver Function Tests: No results for input(s): AST, ALT, ALKPHOS, BILITOT, PROT, ALBUMIN in the last 168 hours. No results for input(s): LIPASE, AMYLASE in the last 168 hours. No results for input(s): AMMONIA in the last 168 hours. Coagulation Profile: No results for input(s): INR, PROTIME in the last 168 hours. Cardiac Enzymes: No results for input(s): CKTOTAL, CKMB, CKMBINDEX, TROPONINI in the last 168 hours. BNP (last 3 results) No results for input(s):  PROBNP in the last 8760 hours. HbA1C: No results for input(s): HGBA1C in the last 72 hours. CBG: Recent Labs  Lab 10/17/20 1617 10/17/20 1957 10/17/20 2304 10/18/20 0421 10/18/20 0744  GLUCAP 145* 198* 119* 113* 118*   Lipid Profile: No results for input(s): CHOL, HDL, LDLCALC, TRIG, CHOLHDL, LDLDIRECT in the last 72 hours. Thyroid Function Tests: No results for input(s): TSH, T4TOTAL, FREET4, T3FREE, THYROIDAB in the last 72 hours. Anemia Panel: No results for input(s): VITAMINB12, FOLATE, FERRITIN, TIBC, IRON, RETICCTPCT in the last 72 hours.    Radiology Studies: I have reviewed all of the imaging during this hospital visit personally     Scheduled Meds: . atorvastatin  20 mg Oral Daily  . Chlorhexidine Gluconate Cloth  6 each Topical Q0600  . heparin sodium (porcine)      . insulin aspart  0-6 Units Subcutaneous Q4H  . pantoprazole  40 mg Oral Daily  . sodium chloride flush  3 mL Intravenous Q12H  . sodium chloride flush  3 mL  Intravenous Q12H  . sodium chloride flush  3 mL Intravenous Q12H   Continuous Infusions: . sodium chloride    . sodium chloride    . amiodarone 60 mg/hr (10/18/20 0625)  . furosemide (LASIX) 200 mg in dextrose 5% 100 mL ('2mg'$ /mL) infusion 30 mg/hr (10/18/20 0812)  . heparin 1,350 Units/hr (10/18/20 [id number])  . milrinone 0.25 mcg/kg/min (10/18/20 0454)     LOS: 11 days        Mauricio Gerome Apley, MD

## 2020-10-18 NOTE — Progress Notes (Signed)
Patient ID: NIKAN RATHGEB, male   DOB: 05-01-1941, 80 y.o.   MRN: [id number]     Advanced Heart Failure Rounding Note  PCP-Cardiologist: No primary care provider on file.    Patient Profile   80 y/o male with obesity and HTN. Admitted with acute HF in setting of AF with RVR of unknown duration.   Echo LVEF 10-15%. RV severely reduced. PICC placed with initial co-ox 40% suggesting markedly low output. CVP up  Subjective:    TEE 3/18 w/ severe Biv dysfunction, Mod-severe MR, severe TR. No LAA thrombus. DCCV unsuccessful  3/24 Diuresing with lasix drip and poor response.  RHC with elevated filling pressures R>L HF , CO 6/CI 2.7. Creatinine 3.5  Temp HD cath placed.   Remains on Amio gtt at 60/hr+ lasix drip at 30 mg /hour + milrinone 0.25. Co-ox 73%.  Due to poor UOP, he had ultrafiltration yesterday with 3L removed. CVP not hooked up currently (bathing).   HR in 110s, AF.   Not dyspneic currently.   Objective:   Weight Range: 110.8 kg Body mass index is 39.43 kg/m.   Vital Signs:   Temp:  [97.6 F (36.4 C)-98.6 F (37 C)] 98.5 F (36.9 C) (03/26 0747) Pulse Rate:  [65-130] 93 (03/26 0747) Resp:  [16-29] 18 (03/26 0423) BP: (82-120)/(60-85) 98/61 (03/26 0747) SpO2:  [94 %-98 %] 98 % (03/26 0747) Weight:  [110.8 kg] 110.8 kg (03/26 0423) Last BM Date: 10/17/20  Weight change: Filed Weights   10/16/20 0424 10/17/20 0034 10/18/20 0423  Weight: 114.7 kg 113.7 kg 110.8 kg    Intake/Output:   Intake/Output Summary (Last 24 hours) at 10/18/2020 0937 Last data filed at 10/18/2020 0813 Gross per 24 hour  Intake 2016.43 ml  Output 3595 ml  Net -1578.57 ml      Physical Exam   CVP not hooked up General: NAD Neck: JVP 14 cm, no thyromegaly or thyroid nodule.  Lungs: Clear to auscultation bilaterally with normal respiratory effort. CV: Nondisplaced PMI.  Heart irregular S1/S2, no S3/S4, no murmur.  1+ edema to knees. Abdomen: Soft, nontender, no hepatosplenomegaly, no  distention.  Skin: Intact without lesions or rashes.  Neurologic: Alert and oriented x 3.  Psych: Normal affect. Extremities: No clubbing or cyanosis.  HEENT: Normal.    Telemetry   A FIb RVR 110s-120s (personally reviewed)  Labs    CBC Recent Labs    10/17/20 0456 10/18/20 0500  WBC 5.2 4.8  HGB 9.9* 9.5*  HCT 29.6* 27.7*  MCV 82.9 82.7  PLT 150 123XX123*   Basic Metabolic Panel Recent Labs    10/17/20 0456 10/18/20 0500  NA 127* 130*  K 3.5 3.1*  CL 93* 94*  CO2 20* 24  GLUCOSE 318* 275*  BUN 52* 31*  CREATININE 3.52* 2.72*  CALCIUM 7.7* 7.7*   Liver Function Tests No results for input(s): AST, ALT, ALKPHOS, BILITOT, PROT, ALBUMIN in the last 72 hours. No results for input(s): LIPASE, AMYLASE in the last 72 hours. Cardiac Enzymes No results for input(s): CKTOTAL, CKMB, CKMBINDEX, TROPONINI in the last 72 hours.  BNP: BNP (last 3 results) Recent Labs    10/07/20 0229  BNP 4,299.0*    ProBNP (last 3 results) No results for input(s): PROBNP in the last 8760 hours.   D-Dimer No results for input(s): DDIMER in the last 72 hours. Hemoglobin A1C No results for input(s): HGBA1C in the last 72 hours. Fasting Lipid Panel No results for input(s): CHOL, HDL, LDLCALC, TRIG,  CHOLHDL, LDLDIRECT in the last 72 hours. Thyroid Function Tests No results for input(s): TSH, T4TOTAL, T3FREE, THYROIDAB in the last 72 hours.  Invalid input(s): FREET3  Other results:   Imaging    No results found.   Medications:     Scheduled Medications: . atorvastatin  20 mg Oral Daily  . Chlorhexidine Gluconate Cloth  6 each Topical Q0600  . heparin sodium (porcine)      . insulin aspart  0-6 Units Subcutaneous Q4H  . pantoprazole  40 mg Oral Daily  . sodium chloride flush  3 mL Intravenous Q12H  . sodium chloride flush  3 mL Intravenous Q12H  . sodium chloride flush  3 mL Intravenous Q12H    Infusions: . sodium chloride    . sodium chloride    . amiodarone 60  mg/hr (10/18/20 0625)  . furosemide (LASIX) 200 mg in dextrose 5% 100 mL ('2mg'$ /mL) infusion 30 mg/hr (10/18/20 0812)  . heparin 1,350 Units/hr (10/18/20 [id number])  . milrinone 0.25 mcg/kg/min (10/18/20 0454)    PRN Medications: sodium chloride, sodium chloride, acetaminophen, acetaminophen, albuterol, dextrose, hydrocortisone cream, ondansetron (ZOFRAN) IV, ondansetron (ZOFRAN) IV, sodium chloride flush, sodium chloride flush, sodium chloride flush  Assessment/Plan   1. A Flutter RVR - Started on amiodarone drip. Remains tachycardic with rates > 110-130 - Failed attempt at DCCV 3/18 - Remains in A fib RVR.  - Continue IV amiodarone load (gtt at 60 mg/hr as HR still high), repeat DCCV after better diuresed => next week at some point.  - TSH normal - Continue IV heparin  2. 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  - Echo this admit w/ severe biventricular HF with EF < 15%.  BNP > 4000 . Suspect tachy-mediated. HS Trop - no trend. No chest pain. No LHC for now. Could consider ischemic work up later if renal function improves. Susepct he may need RHC  - PICC placed initial Co-ox 40%. Started Milrinone 0.25 - Had [place] 3/24 with elevated filling pressures R>L and preserved cardiac output.  - CO-OX 73%. Continue milrinone 0.25 mcg.  - CVP line not hooked up but JVP elevated and significant peripheral edema, think still volume overloaded.  Due to poor response to diuretics, had UF yesterday.  Plan to proceed with UF again today.  He remains on Lasix gtt at 30.   - No bb w/ low output  - No arb/dig/spiro with AKI  - c/w unna boots.   3. AKI on CKD Stage IIIa - Creatinine on admit 2.8.  Most recent creatinine was 1.55 back in 03/2020. - started on milrinone for low output - Has temporary HD cath and got UF 3L yesterday.  Will plan UF again later today.  - Nephrology appreciate their assistance  4. DMII - Hgb A1C 5.9  - On SSI  5. Lower Extremity Edema -  Dopplers negative for DVT - 2/2 CHF Continue diuretics + unna boots   6. 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  -Continue PPI  7. HTN  -Soft.   8. Hypervolemic Hyponatremia - Na 130 Restrict free water.   Length of Stay: 37  Loralie Champagne, MD  10/18/2020, 9:37 AM  Advanced Heart Failure Team Pager [phone] (M-F; 7a - 5p)

## 2020-10-19 DIAGNOSIS — N189 Chronic kidney disease, unspecified: Secondary | ICD-10-CM | POA: Diagnosis not present

## 2020-10-19 DIAGNOSIS — I5041 Acute combined systolic (congestive) and diastolic (congestive) heart failure: Secondary | ICD-10-CM | POA: Diagnosis not present

## 2020-10-19 DIAGNOSIS — N179 Acute kidney failure, unspecified: Secondary | ICD-10-CM | POA: Diagnosis not present

## 2020-10-19 DIAGNOSIS — I4819 Other persistent atrial fibrillation: Secondary | ICD-10-CM | POA: Diagnosis not present

## 2020-10-19 DIAGNOSIS — I10 Essential (primary) hypertension: Secondary | ICD-10-CM | POA: Diagnosis not present

## 2020-10-19 DIAGNOSIS — I5043 Acute on chronic combined systolic (congestive) and diastolic (congestive) heart failure: Secondary | ICD-10-CM | POA: Diagnosis not present

## 2020-10-19 DIAGNOSIS — I484 Atypical atrial flutter: Secondary | ICD-10-CM | POA: Diagnosis not present

## 2020-10-19 LAB — BASIC METABOLIC PANEL
Anion gap: 11 (ref 5–15)
BUN: 38 mg/dL — ABNORMAL HIGH (ref 8–23)
CO2: 23 mmol/L (ref 22–32)
Calcium: 8 mg/dL — ABNORMAL LOW (ref 8.9–10.3)
Chloride: 98 mmol/L (ref 98–111)
Creatinine, Ser: 3.52 mg/dL — ABNORMAL HIGH (ref 0.61–1.24)
GFR, Estimated: 17 mL/min — ABNORMAL LOW (ref >60)
Glucose, Bld: 112 mg/dL — ABNORMAL HIGH (ref 70–99)
Potassium: 3.5 mmol/L (ref 3.5–5.1)
Sodium: 132 mmol/L — ABNORMAL LOW (ref 135–145)

## 2020-10-19 LAB — CBC
HCT: 29.1 % — ABNORMAL LOW (ref 39.0–52.0)
Hemoglobin: 9.8 g/dL — ABNORMAL LOW (ref 13.0–17.0)
MCH: 28.2 pg (ref 26.0–34.0)
MCHC: 33.7 g/dL (ref 30.0–36.0)
MCV: 83.6 fL (ref 80.0–100.0)
Platelets: 134 10*3/uL — ABNORMAL LOW (ref 150–400)
RBC: 3.48 MIL/uL — ABNORMAL LOW (ref 4.22–5.81)
RDW: 19.4 % — ABNORMAL HIGH (ref 11.5–15.5)
WBC: 5.5 10*3/uL (ref 4.0–10.5)
nRBC: 0 % (ref 0.0–0.2)

## 2020-10-19 LAB — GLUCOSE, CAPILLARY
Glucose-Capillary: 111 mg/dL — ABNORMAL HIGH (ref 70–99)
Glucose-Capillary: 123 mg/dL — ABNORMAL HIGH (ref 70–99)
Glucose-Capillary: 125 mg/dL — ABNORMAL HIGH (ref 70–99)
Glucose-Capillary: 126 mg/dL — ABNORMAL HIGH (ref 70–99)
Glucose-Capillary: 239 mg/dL — ABNORMAL HIGH (ref 70–99)

## 2020-10-19 LAB — COOXEMETRY PANEL
Carboxyhemoglobin: 1 % (ref 0.5–1.5)
Methemoglobin: 0.8 % (ref 0.0–1.5)
O2 Saturation: 66.6 %
Total hemoglobin: 9.9 g/dL — ABNORMAL LOW (ref 12.0–16.0)

## 2020-10-19 LAB — HEPATITIS B SURFACE ANTIBODY, QUANTITATIVE: Hep B S AB Quant (Post): <3.1 m[IU]/mL — ABNORMAL LOW (ref >9.9)

## 2020-10-19 LAB — HEPARIN LEVEL (UNFRACTIONATED): Heparin Unfractionated: 0.3 IU/mL (ref 0.30–0.70)

## 2020-10-19 MED ORDER — POTASSIUM CHLORIDE CRYS ER 20 MEQ PO TBCR
40.0000 meq | EXTENDED_RELEASE_TABLET | Freq: Once | ORAL | Status: AC
  Administered 2020-10-19: 40 meq via ORAL
  Filled 2020-10-19: qty 2

## 2020-10-19 NOTE — Progress Notes (Signed)
[facility] for Xarelto>Heparin Indication: atrial fibrillation  No Known Allergies  Patient Measurements: Height: '5\' 6"'$  (167.6 cm) Weight: 108.4 kg (238 lb 15.7 oz) IBW/kg (Calculated) : 63.8 Heparin Dosing Weight: 89.3 kg  Vital Signs: Temp: 98.4 F (36.9 C) (03/27 0339) Temp Source: Oral (03/27 0339) BP: 98/66 (03/27 0339) Pulse Rate: 114 (03/27 0339)  Labs: Recent Labs    10/17/20 0456 10/18/20 0500 10/19/20 0500  HGB 9.9* 9.5* 9.8*  HCT 29.6* 27.7* 29.1*  PLT 150 126* 134*  HEPARINUNFRC 0.33 0.58 0.30  CREATININE 3.52* 2.72* 3.52*    Estimated Creatinine Clearance: 20.3 mL/min (A) (by C-G formula based on SCr of 3.52 mg/dL (H)).   Assessment: 80 yo M with new Afib with RVR. No AC noted PTA. Was started on heparin then switched to Xarelto 3/16 and received 1 dose. Pharmacy asked to switch back to IV heparin. Patient now s/p unsuccessful DCCV. Plans to try again once adequately diuresed.  Heparin level remains therapeutic at 0.30 but at lower end of goal after slight rate decrease to 1350 units/hr. Scr improved yesterday (2.72) but back to 3.52 today. Hgb, PLTs stable. Per RN, no bleeding or infusion issues noted.  Goal of Therapy:  Heparin level 0.3-0.7 units/ml Monitor platelets by anticoagulation protocol: Yes   Plan:  Cont heparin 1350 units/hr  Monitor daily HL, CBC, s/sx bleeding  Romilda Garret, PharmD PGY1 Acute Care Pharmacy Resident 10/19/2020 7:07 AM  Please check [email or web address] for unit specific pharmacy phone numbers.

## 2020-10-19 NOTE — Progress Notes (Signed)
[facility] Progress Note    Assessment/ Plan:   Assessment/Recommendations: Kyle Hicks is a/an 80 y.o. 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 Fib/A. Flutter, CKD Stage 3a, T2DM, HTN, obesity.  1. Non-Oliguric AKI: Likely a combination of hypoperfusion secondary to tachycardia and vascular congestion secondary to cardiorenal syndrome. No uremic symptoms however has not had adequate diuresis despite high dose lasix gtt. -Diuretics per Heart Failure team -Appreciate assistance with temp HD line placement 3/24 -on milrinone and lasix gtt -s/p Dry UF session 3/25 and 3/26 and responded very well. Plan is to monitor for now and assess for further UF needs. Maintain catheter for now, c/w lasix gtt -Continue to monitor daily Cr, Dose meds for GFR<15 -Monitor Daily I/Os, Daily weight  -Maintain MAP>65 for optimal renal perfusion.  -Agree with holding ACE-I, avoid further nephrotoxins including NSAIDS, Morphine. Unless absolutely necessary, avoid CT with contrast and/or MRI with gadolinium.  2. 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  UACR initially normal in 2018; markedly elevated in 2021 at 612. Additional differential includes obesity related GN.  Baseline creatinine was previously 1.2 - 1.6.   3. Acute Biventricular Heart Failure: Previous hx of HFpEF with progression to HFrEF with EF of 10-15%; suspected to be in the setting of A. Fib with RVR. Significantly volume overloaded with difficulty diuresing. He is currently on Lasix gtt and Milrinone with Metolazane prn -HF following, holding on further UF sessions  4. Hyponatremia (hypervolemic) -improved with UF, Na up to 132  5. A. Fib with RVR: Failed DCCV on 3/18. Plan to repeat if remains in AF. Currently on Amiodarone.   6. Type 2 Diabetes  [place]: -per primary  7. Hx of HTN: Previously on HCTZ, Nifedipine and Ramipril. Currently all three are being held. BP has been soft during this admission. 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 At that time, patient refused further evaluation. LFTs WNL on admission.  Defer to primary  Gean Quint, MD [facility]  Subjective:   S/p DUF (2nd treatment) yesterday. Net UF 2800cc. Na up to 132, uop 775cc. Weight down to 108.4kg this am. Patient reports that he feels better, feels less swollen and he feels that his breathing is much better. No other complaints today.  Objective:   BP (!) 100/56 (BP Location: Left Arm)   Pulse 98   Temp 98.4 F (36.9 C) (Oral)   Resp 19   Ht '5\' 6"'$  (1.676 m)   Wt 108.4 kg   SpO2 97%   BMI 38.57 kg/m   Intake/Output Summary (Last 24 hours) at 10/19/2020 1025 Last data filed at 10/19/2020 0600 Gross per 24 hour  Intake 1547.53 ml  Output 3600 ml  Net -2052.47 ml   Weight change: 0.6 kg  Physical Exam: [id number] elderly gentleman, sitting up in bed CVS: irregularly irregular. No murmurs Resp:No wheezing or rales. No respiratory distress. cta bl Abd: obese, soft, nt  Ext: 1+ pitting edema of the lower extremities up to knees (softer) Neuro: awake, interactive, speech clear and coherent  Imaging: No results found. Labs: BMET Recent Labs  Lab 10/14/20 0406 10/15/20 0431 10/15/20 1300 10/16/20 0500 10/16/20 1318 10/17/20 0456 10/18/20 0500 10/19/20 0500  NA 133* 132* 134* 133* 133*  136 127* 130* 132*  K  3.8 3.4* 3.8 3.6 3.7  3.4* 3.5 3.1* 3.5  CL 102 100 101 100  --  93* 94* 98  CO2 20* 21* 21* 23  --  20* 24 23  GLUCOSE 134* 128* 152* 134*  --  318* 275* 112*  BUN 50* 54* 54* 54*  --  52* 31* 38*  CREATININE 3.02* 3.25* 3.35* 3.48*  --  3.52* 2.72* 3.52*  CALCIUM 8.4* 8.1* 8.2* 8.1*  --  7.7* 7.7* 8.0*   CBC Recent Labs  Lab  10/16/20 0500 10/16/20 1318 10/17/20 0456 10/18/20 0500 10/19/20 0500  WBC 5.4  --  5.2 4.8 5.5  HGB 10.3* 11.6*  10.9* 9.9* 9.5* 9.8*  HCT 30.7* 34.0*  32.0* 29.6* 27.7* 29.1*  MCV 83.4  --  82.9 82.7 83.6  PLT 143*  --  150 126* 134*    Medications:    . atorvastatin  20 mg Oral Daily  . Chlorhexidine Gluconate Cloth  6 each Topical Q0600  . insulin aspart  0-6 Units Subcutaneous Q4H  . insulin glargine  10 Units Subcutaneous Daily  . pantoprazole  40 mg Oral Daily  . sodium chloride flush  3 mL Intravenous Q12H  . sodium chloride flush  3 mL Intravenous Q12H  . sodium chloride flush  3 mL Intravenous Q12H   10/19/2020, 10:25 AM

## 2020-10-19 NOTE — Progress Notes (Signed)
Patient ID: Kyle Hicks, male   DOB: 04-Jul-1941, 80 y.o.   MRN: [id number]     Advanced Heart Failure Rounding Note  PCP-Cardiologist: No primary care provider on file.    Patient Profile   79 y/o male with obesity and HTN. Admitted with acute HF in setting of AF with RVR of unknown duration.   Echo LVEF 10-15%. RV severely reduced. PICC placed with initial co-ox 40% suggesting markedly low output. CVP up  Subjective:    TEE 3/18 w/ severe Biv dysfunction, Mod-severe MR, severe TR. No LAA thrombus. DCCV unsuccessful  3/24 Diuresing with lasix drip and poor response.  RHC with elevated filling pressures R>L HF , CO 6/CI 2.7. Creatinine 3.5  Temp HD cath placed.   Remains on [place] gtt at 60/hr+ lasix drip at 30 mg /hour + milrinone 0.25. Co-ox 67%. Ultrafiltration #2 yesterday with 3L removed again.  CVP 10-11 today.   HR in 90s, ?AF versus NSR with PACs.   Breathing is getting better.    Objective:   Weight Range: 108.4 kg Body mass index is 38.57 kg/m.   Vital Signs:   Temp:  [97.4 F (36.3 C)-98.7 F (37.1 C)] 98.4 F (36.9 C) (03/27 0339) Pulse Rate:  [89-123] 92 (03/27 0700) Resp:  [16-22] 16 (03/27 0339) BP: (94-126)/(60-92) 97/72 (03/27 0700) SpO2:  [97 %-100 %] 98 % (03/27 0339) Weight:  [108.4 kg-111.4 kg] 108.4 kg (03/27 0339) Last BM Date: 10/18/20  Weight change: Filed Weights   10/18/20 1309 10/18/20 1614 10/19/20 0339  Weight: 111.4 kg 108.9 kg 108.4 kg    Intake/Output:   Intake/Output Summary (Last 24 hours) at 10/19/2020 0805 Last data filed at 10/19/2020 0600 Gross per 24 hour  Intake 1553.53 ml  Output 3600 ml  Net -2046.47 ml      Physical Exam  CVP 10-11 General: NAD Neck: JVP 8-9 cm, no thyromegaly or thyroid nodule.  Lungs: Clear to auscultation bilaterally with normal respiratory effort. CV: Nondisplaced PMI.  Heart irregular S1/S2, no S3/S4, no murmur.  No peripheral edema.   Abdomen: Soft, nontender, no hepatosplenomegaly, no  distention.  Skin: Intact without lesions or rashes.  Neurologic: Alert and oriented x 3.  Psych: Normal affect. Extremities: No clubbing or cyanosis.  HEENT: Normal.    Telemetry   AF versus NSR with frequent PACs in 90s (personally reviewed)  Labs    CBC Recent Labs    10/18/20 0500 10/19/20 0500  WBC 4.8 5.5  HGB 9.5* 9.8*  HCT 27.7* 29.1*  MCV 82.7 83.6  PLT 126* Q000111Q*   Basic Metabolic Panel Recent Labs    10/18/20 0500 10/19/20 0500  NA 130* 132*  K 3.1* 3.5  CL 94* 98  CO2 24 23  GLUCOSE 275* 112*  BUN 31* 38*  CREATININE 2.72* 3.52*  CALCIUM 7.7* 8.0*   Liver Function Tests No results for input(s): AST, ALT, ALKPHOS, BILITOT, PROT, ALBUMIN in the last 72 hours. No results for input(s): LIPASE, AMYLASE in the last 72 hours. Cardiac Enzymes No results for input(s): CKTOTAL, CKMB, CKMBINDEX, TROPONINI in the last 72 hours.  BNP: BNP (last 3 results) Recent Labs    10/07/20 0229  BNP 4,299.0*    ProBNP (last 3 results) No results for input(s): PROBNP in the last 8760 hours.   D-Dimer No results for input(s): DDIMER in the last 72 hours. Hemoglobin A1C No results for input(s): HGBA1C in the last 72 hours. Fasting Lipid Panel No results for input(s): CHOL, HDL,  LDLCALC, TRIG, CHOLHDL, LDLDIRECT in the last 72 hours. Thyroid Function Tests No results for input(s): TSH, T4TOTAL, T3FREE, THYROIDAB in the last 72 hours.  Invalid input(s): FREET3  Other results:   Imaging    No results found.   Medications:     Scheduled Medications: . atorvastatin  20 mg Oral Daily  . Chlorhexidine Gluconate Cloth  6 each Topical Q0600  . insulin aspart  0-6 Units Subcutaneous Q4H  . insulin glargine  10 Units Subcutaneous Daily  . pantoprazole  40 mg Oral Daily  . sodium chloride flush  3 mL Intravenous Q12H  . sodium chloride flush  3 mL Intravenous Q12H  . sodium chloride flush  3 mL Intravenous Q12H    Infusions: . sodium chloride    .  sodium chloride    . amiodarone 60 mg/hr (10/19/20 0710)  . furosemide (LASIX) 200 mg in dextrose 5% 100 mL ('2mg'$ /mL) infusion 30 mg/hr (10/18/20 2311)  . heparin 1,350 Units/hr (10/18/20 1856)  . milrinone 0.25 mcg/kg/min (10/18/20 2311)    PRN Medications: sodium chloride, sodium chloride, acetaminophen, acetaminophen, albuterol, dextrose, hydrocortisone cream, ondansetron (ZOFRAN) IV, ondansetron (ZOFRAN) IV, sodium chloride flush, sodium chloride flush, sodium chloride flush  Assessment/Plan   1. A Flutter RVR - Started on amiodarone drip. - Failed attempt at [place] 3/18 - HR 90s-100s today, ?AF versus NSR with PACs.  Will need ECG today.  Can decrease amiodarone to 30 mg/hr.  If still AF, will need eventual DCCV re-attempt.  - TSH normal - Continue IV heparin  2. 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  - Echo this admit w/ severe biventricular HF with EF < 15%.  BNP > 4000 . Suspect tachy-mediated. HS Trop - no trend. No chest pain. No LHC for now. Could consider ischemic work up later if renal function improves. Susepct he may need RHC  - PICC placed initial Co-ox 40%. Started Milrinone 0.25 - Had [place] 3/24 with elevated filling pressures R>L and preserved cardiac output.  - CO-OX 67%. Continue milrinone 0.25 mcg for now.  - CVP 10-11 today, has had UF runs x 2, weight down.  He is on Lasix gtt 30 mg/hr.  Think reasonable to follow on Lasix gtt today and reassess UOP over today to see if he needs further UF runs.   - No bb w/ low output  - No arb/dig/spiro with AKI  - c/w unna boots.   3. AKI on CKD Stage IIIa - Creatinine on admit 2.8.  Most recent creatinine was 1.55 back in 03/2020. - started on milrinone for low output - Has temporary HD cath and got UF runs x 2 days.  Hold UF today with CVP down to 10 to see how he responds to Lasix gtt unless nephrology thinks otherwise.   4. DMII - Hgb A1C 5.9  - On SSI  5. Lower Extremity  Edema - Dopplers negative for DVT - 2/2 CHF Continue diuretics + unna boots   6. 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  -Continue PPI  7. HTN  -Soft.   8. Hypervolemic Hyponatremia - Na 132 Restrict free water.   Length of Stay: 34  Loralie Champagne, MD  10/19/2020, 8:05 AM  Advanced Heart Failure Team Pager [phone] (M-F; 7a - 5p)

## 2020-10-19 NOTE — Progress Notes (Signed)
PROGRESS NOTE    ADETOKUNBO BOLLENBACH  [id number] DOB: 09-May-1941 DOA: 10/07/2020 PCP: Sharilyn Sites, MD    Brief Narrative:  Mr. Oglesbee admitted to the hospital with the working diagnosis of acute on chronic systolic heart failure decompensation, complicated with pulmonary HTN class 2, acute core pulmonale, biventricular failure..  80 year old male past medical history for hypertension, heart failure, type 2 diabetes mellitus and history of GI bleed who presented after mechanical fall. On the day of admission he got up to use the restroom, fell a week and sustained a mechanical fall. No head trauma or loss of consciousness. Reports lower extremity edema, dyspnea, wheezing or palpitations. Positive diarrhea. On his initial physical examination heart rate 129-134, respiratory rate 19-43, blood pressure 105/73-130 8/108, oxygen saturation 98% on supplemental oxygen. His lungs had no wheezing or rales, heart S1-S2, present, rhythmic, tachycardic, abdomen soft, positive lower extremity edema.  Chest radiograph with cardiomegaly, positive hilar vascular congestion, small right pleural effusion.  EKG 133 bpm, left axis deviation, Q waves lead II, lead III, aVF, prolonged QTc545, right bundle branch block,atrial flutter, no ST segment changes, negative T wave V1-V3.  Patientsuspected low output heart failure andstarted on milrinone infusion along with furosemide drip. Amiodarone drip for rate control atrial flutter.  Right heart cath with PCW 22 with PA mean 33 and cardiac output 6.0 per Fick and 6.9 per thermodilution. (on milrinone 0.25 mcg/kg/hr) Right HD IJ HD cathter placed and plan for further fluid removal per ultrafiltration.  Underwent ultrafiltration on 03/25 (3,000 ml) and on 03/26 (2,800 ml) with good toleration.    Assessment & Plan:   Principal Problem:   Acute CHF (congestive heart failure) (HCC) Active Problems:   Acute kidney injury superimposed on chronic  kidney disease (HCC)   Essential hypertension   Elevated d-dimer   Obesity (BMI 30-39.9)   Diarrhea   Atrial flutter (HCC)   Atrial fibrillation ([place])   1. Acute on chronic systolic heart failure/ biventricular failure/ acute core pulmonale/ precapillary pulmonary hypertension, class 2.  LV EF 10 to 15%, global hypokinesis, SV 41 RV systolic severely reduced, RVSP 49,1 mmHg.  Bi-atrial enlargement, severe TR.  Right heart catheterization03/24: PA mean 33 PCW 22 PVR 1.8 Cardiac output 6.0 (Fick) 6,9 (thermodilution) on milrinone 0,25 mcg/kg/hr.  Continue to improve his volume status but not yet back to baseline, he had ultrafiltration for to consecutive days with total of 5,800 ml of fluid removed with good toleration.  Documented urine output is 800 cc over last 24 hrs.   Continue with furosemide drip 30 mg per hr, milrinone 0.25 mcg/kg/hr and amiodarone drip.  On milrinone infusion systolic blood pressure 97 to 100 mmHg.   2. Atrial flutter with RVR/ prolonged QTc. Sp unsuccessful cardioversion 03/18. Continue with tachycardia, telemetry personally reviewed with occasional p waves,   Anticoagulation with heparin and rate/ rhythm control with amiodarone, plan for repeat cardioversion.   3. 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 Sp ultrafiltration 5,800 ml, urine output 800 ml over last 24 hrs, on furosemide drip 30 ml/ hr. Renal function with serum cr at 3,52 with Na at 132 and K at 3,5.   Continue with furosemide drip, add 40 meq KCl today and follow up renal function in am. Continue hemodynamic support with milrinone and amiodarone.   4. T2DM with dyslipidemiapatient tolerating po well, fasting glucose this am 112. Continue with basal insulin 10 units glargine and insulin sliding scale.   Continue with atorvastatin  5. Obesity class 3. Calculated BMI is 40,0  6. HTN. Diuresis, holding further  antihypertensive medications due to risk of hypotension.     Patient continue to be at high risk for worsening heart failure   Status is: Inpatient  Remains inpatient appropriate because:IV treatments appropriate due to intensity of illness or inability to take PO   Dispo: The patient is from: Home              Anticipated d/c is to: Home              Patient currently is not medically stable to d/c.   Difficult to place patient No    DVT prophylaxis: Heparin   Code Status:   full  Family Communication:  No family at the bedside      Consultants:   Cardiology   Nephrology   Procedures:   Right heart cath  Right HD catheter IJ      Subjective: Patient is feeling better dyspnea and edema continue to improve, no nausea or vomiting, no chest pain, this am is out of bed to chair,   Objective: Vitals:   10/18/20 2315 10/19/20 0339 10/19/20 0700 10/19/20 0900  BP: '99/61 98/66 97/72 '$ (!) 100/56  Pulse: (!) 106 (!) 114 92 98  Resp: '19 16  19  '$ Temp: 98.6 F (37 C) 98.4 F (36.9 C)  98.4 F (36.9 C)  TempSrc: Oral Oral  Oral  SpO2: 97% 98%  97%  Weight:  108.4 kg    Height:        Intake/Output Summary (Last 24 hours) at 10/19/2020 1028 Last data filed at 10/19/2020 0600 Gross per 24 hour  Intake 1547.53 ml  Output 3600 ml  Net -2052.47 ml   Filed Weights   10/18/20 1309 10/18/20 1614 10/19/20 0339  Weight: 111.4 kg 108.9 kg 108.4 kg    Examination:   General: Not in pain or dyspnea, deconditioned  Neurology: Awake and alert, non focal  E ENT: no pallor, no icterus, oral mucosa moist Cardiovascular: No JVD. S1-S2 present, rhythmic, no gallops, rubs, or murmurs. +/++ bilateral lower extremity edema. Unna boots in place.  Pulmonary: positive breath sounds bilaterally, mild rales at bases.  Gastrointestinal. Abdomen soft and non tender Skin. No rashes Musculoskeletal: no joint deformities     Data Reviewed: I have personally reviewed following labs  and imaging studies  CBC: Recent Labs  Lab 10/15/20 0431 10/16/20 0500 10/16/20 1318 10/17/20 0456 10/18/20 0500 10/19/20 0500  WBC 5.2 5.4  --  5.2 4.8 5.5  HGB 10.3* 10.3* 11.6*  10.9* 9.9* 9.5* 9.8*  HCT 30.2* 30.7* 34.0*  32.0* 29.6* 27.7* 29.1*  MCV 83.0 83.4  --  82.9 82.7 83.6  PLT 145* 143*  --  150 126* Q000111Q*   Basic Metabolic Panel: Recent Labs  Lab 10/15/20 1300 10/16/20 0500 10/16/20 1318 10/17/20 0456 10/18/20 0500 10/19/20 0500  NA 134* 133* 133*  136 127* 130* 132*  K 3.8 3.6 3.7  3.4* 3.5 3.1* 3.5  CL 101 100  --  93* 94* 98  CO2 21* 23  --  20* 24 23  GLUCOSE 152* 134*  --  318* 275* 112*  BUN 54* 54*  --  52* 31* 38*  CREATININE 3.35* 3.48*  --  3.52* 2.72* 3.52*  CALCIUM 8.2* 8.1*  --  7.7* 7.7* 8.0*   GFR: Estimated Creatinine Clearance: 20.3 mL/min (A) (by C-G formula based on SCr of 3.52 mg/dL (H)). Liver Function Tests:  No results for input(s): AST, ALT, ALKPHOS, BILITOT, PROT, ALBUMIN in the last 168 hours. No results for input(s): LIPASE, AMYLASE in the last 168 hours. No results for input(s): AMMONIA in the last 168 hours. Coagulation Profile: No results for input(s): INR, PROTIME in the last 168 hours. Cardiac Enzymes: No results for input(s): CKTOTAL, CKMB, CKMBINDEX, TROPONINI in the last 168 hours. BNP (last 3 results) No results for input(s): PROBNP in the last 8760 hours. HbA1C: No results for input(s): HGBA1C in the last 72 hours. CBG: Recent Labs  Lab 10/18/20 1644 10/18/20 2026 10/19/20 0025 10/19/20 0419 10/19/20 0727  GLUCAP 156* 134* 125* 111* 123*   Lipid Profile: No results for input(s): CHOL, HDL, LDLCALC, TRIG, CHOLHDL, LDLDIRECT in the last 72 hours. Thyroid Function Tests: No results for input(s): TSH, T4TOTAL, FREET4, T3FREE, THYROIDAB in the last 72 hours. Anemia Panel: No results for input(s): VITAMINB12, FOLATE, FERRITIN, TIBC, IRON, RETICCTPCT in the last 72 hours.    Radiology Studies: I have  reviewed all of the imaging during this hospital visit personally     Scheduled Meds: . atorvastatin  20 mg Oral Daily  . Chlorhexidine Gluconate Cloth  6 each Topical Q0600  . insulin aspart  0-6 Units Subcutaneous Q4H  . insulin glargine  10 Units Subcutaneous Daily  . pantoprazole  40 mg Oral Daily  . sodium chloride flush  3 mL Intravenous Q12H  . sodium chloride flush  3 mL Intravenous Q12H  . sodium chloride flush  3 mL Intravenous Q12H   Continuous Infusions: . sodium chloride    . sodium chloride    . amiodarone 30 mg/hr (10/19/20 0823)  . furosemide (LASIX) 200 mg in dextrose 5% 100 mL ('2mg'$ /mL) infusion 30 mg/hr (10/19/20 0823)  . heparin 1,350 Units/hr (10/18/20 1856)  . milrinone 0.25 mcg/kg/min (10/18/20 2311)     LOS: 12 days        Mauricio Gerome Apley, MD

## 2020-10-20 DIAGNOSIS — N179 Acute kidney failure, unspecified: Secondary | ICD-10-CM | POA: Diagnosis not present

## 2020-10-20 DIAGNOSIS — I5041 Acute combined systolic (congestive) and diastolic (congestive) heart failure: Secondary | ICD-10-CM | POA: Diagnosis not present

## 2020-10-20 DIAGNOSIS — I4819 Other persistent atrial fibrillation: Secondary | ICD-10-CM | POA: Diagnosis not present

## 2020-10-20 DIAGNOSIS — I10 Essential (primary) hypertension: Secondary | ICD-10-CM | POA: Diagnosis not present

## 2020-10-20 LAB — COOXEMETRY PANEL
Carboxyhemoglobin: 1.2 % (ref 0.5–1.5)
Methemoglobin: 0.8 % (ref 0.0–1.5)
O2 Saturation: 73.5 %
Total hemoglobin: 9.6 g/dL — ABNORMAL LOW (ref 12.0–16.0)

## 2020-10-20 LAB — GLUCOSE, CAPILLARY
Glucose-Capillary: 106 mg/dL — ABNORMAL HIGH (ref 70–99)
Glucose-Capillary: 107 mg/dL — ABNORMAL HIGH (ref 70–99)
Glucose-Capillary: 108 mg/dL — ABNORMAL HIGH (ref 70–99)
Glucose-Capillary: 117 mg/dL — ABNORMAL HIGH (ref 70–99)
Glucose-Capillary: 123 mg/dL — ABNORMAL HIGH (ref 70–99)
Glucose-Capillary: 133 mg/dL — ABNORMAL HIGH (ref 70–99)

## 2020-10-20 LAB — CBC
HCT: 29 % — ABNORMAL LOW (ref 39.0–52.0)
Hemoglobin: 9.9 g/dL — ABNORMAL LOW (ref 13.0–17.0)
MCH: 28.4 pg (ref 26.0–34.0)
MCHC: 34.1 g/dL (ref 30.0–36.0)
MCV: 83.1 fL (ref 80.0–100.0)
Platelets: 133 10*3/uL — ABNORMAL LOW (ref 150–400)
RBC: 3.49 MIL/uL — ABNORMAL LOW (ref 4.22–5.81)
RDW: 19.4 % — ABNORMAL HIGH (ref 11.5–15.5)
WBC: 5.1 10*3/uL (ref 4.0–10.5)
nRBC: 0 % (ref 0.0–0.2)

## 2020-10-20 LAB — BASIC METABOLIC PANEL
Anion gap: 9 (ref 5–15)
BUN: 41 mg/dL — ABNORMAL HIGH (ref 8–23)
CO2: 23 mmol/L (ref 22–32)
Calcium: 7.9 mg/dL — ABNORMAL LOW (ref 8.9–10.3)
Chloride: 99 mmol/L (ref 98–111)
Creatinine, Ser: 4.29 mg/dL — ABNORMAL HIGH (ref 0.61–1.24)
GFR, Estimated: 14 mL/min — ABNORMAL LOW (ref >60)
Glucose, Bld: 105 mg/dL — ABNORMAL HIGH (ref 70–99)
Potassium: 3.8 mmol/L (ref 3.5–5.1)
Sodium: 131 mmol/L — ABNORMAL LOW (ref 135–145)

## 2020-10-20 LAB — HEPARIN LEVEL (UNFRACTIONATED): Heparin Unfractionated: 0.26 IU/mL — ABNORMAL LOW (ref 0.30–0.70)

## 2020-10-20 MED ORDER — HEPARIN SODIUM (PORCINE) 1000 UNIT/ML IJ SOLN
INTRAMUSCULAR | Status: AC
  Filled 2020-10-20: qty 4

## 2020-10-20 MED ORDER — METOLAZONE 5 MG PO TABS
5.0000 mg | ORAL_TABLET | Freq: Once | ORAL | Status: AC
  Administered 2020-10-20: 5 mg via ORAL
  Filled 2020-10-20: qty 1

## 2020-10-20 NOTE — Progress Notes (Signed)
PT Cancellation Note  Patient Details Name: Kyle Hicks MRN: [id number] DOB: [birth year]   Cancelled Treatment:    Reason Eval/Treat Not Completed: Patient at procedure or test/unavailable Pt off floor. Will follow up as time allows.   Marguarite Arbour A Sargon Scouten 10/20/2020, 2:30 PM Marisa Severin, PT, DPT Acute Rehabilitation Services Pager [phone] Office [phone]

## 2020-10-20 NOTE — Progress Notes (Signed)
PROGRESS NOTE    Kyle Hicks  [id number] DOB: 02-04-1941 DOA: 10/07/2020 PCP: Sharilyn Sites, MD    Brief Narrative:  Kyle Hicks admitted to the hospitalwith theworking diagnosis of acute on chronic systolic heart failure decompensation, complicated with pulmonary HTN class 2, acute core pulmonale, biventricular failure..  80 year old male past medical history for hypertension, heart failure, type 2 diabetes mellitus and history of GI bleed who presented after mechanical fall. On the day of admission he got up to use the restroom, fell a week and sustained a mechanical fall. No head trauma or loss of consciousness. Reports lower extremity edema, dyspnea, wheezing or palpitations. Positive diarrhea. On his initial physical examination heart rate 129-134, respiratory rate 19-43, blood pressure 105/73-130 8/108, oxygen saturation 98% on supplemental oxygen. His lungs had no wheezing or rales, heart S1-S2, present, rhythmic, tachycardic, abdomen soft, positive lower extremity edema.  Chest radiograph with cardiomegaly, positive hilar vascular congestion, small right pleural effusion.  EKG 133 bpm, left axis deviation, Q waves lead II, lead III, aVF, prolonged QTc545, right bundle branch block,atrial flutter, no ST segment changes, negative T wave V1-V3.  Patientsuspected low output heart failure andstarted on milrinone infusion along with furosemide drip. Amiodarone drip for rate control atrial flutter.  Right heart cath with PCW 22 with PA mean 33 and cardiac output 6.0 per Fick and 6.9 per thermodilution. (on milrinone 0.25 mcg/kg/hr) Right HD IJ HD cathter placed and plan for further fluid removal per ultrafiltration.  Underwent ultrafiltration on 03/25 (3,000 ml) and on 03/26 (2,800 ml)with good toleration.     Assessment & Plan:   Principal Problem:   Acute CHF (congestive heart failure) (HCC) Active Problems:   Acute kidney injury superimposed on chronic  kidney disease (HCC)   Essential hypertension   Elevated d-dimer   Obesity (BMI 30-39.9)   Diarrhea   Atrial flutter (HCC)   Atrial fibrillation ([place])   1. Acute on chronic systolic heart failure/ biventricular failure/ acute core pulmonale/ precapillary pulmonary hypertension, class 2.  LV EF 10 to 15%, global hypokinesis, SV 41 RV systolic severely reduced, RVSP 49,1 mmHg.  Bi-atrial enlargement, severe TR.  Right heart catheterization03/24: PA mean 33 PCW 22 PVR 1.8 Cardiac output 6.0 (Fick) 6,9 (thermodilution) on milrinone 0,25 mcg/kg/hr.  SP ultrafiltration for to consecutive days with total of 5,800 ml of fluid removed with good toleration.  Urine output over last 24 hrs is 325 ml.   Plan to continue with furosemide drip 30 mg per hr, milrinone 0.25 mcg/kg/hr and amiodarone drip.  Continue with milrinone infusion systolic blood pressure 97 to 100 mmHg.  Extra dose of metolazone today 5 mg.   2. Atrial flutter with RVR/ prolonged QTc. Sp unsuccessful cardioversion 03/18. Heart rate has improved down to low 100, intermittent sinus rhythm, but more prevalent rhythm is paroxysmal atrial fibrillation.  Continue amiodarone infusion and anticoagulation with heparin drip.   3. 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 Sp ultrafiltration 5,800 ml.  Low urine output today to 325 ml.   Na is 131, K 3,8, bicarb 23 and serum cr up to 4,29.  Continue diuresis with furosemide drip, hemodynamic support with milrinone.  No apparent need for urgent hemodialysis today, continue to follow up with nephrology recommendations.   4. T2DM with dyslipidemiafasting glucose this am is 105, will continue with basal insulin 10 units glargine and insulin sliding scale.   On atorvastatin   5. Obesity class 3. Calculated BMI is 40,0  6. HTN.  systolic blood pressure [id number] mmHg this am, continue close hemodynamic support.      Patient continue to be at high risk for worsening renal and heart failure,   Status is: Inpatient  Remains inpatient appropriate because:IV treatments appropriate due to intensity of illness or inability to take PO   Dispo: The patient is from: Home              Anticipated d/c is to: Home              Patient currently is not medically stable to d/c.   Difficult to place patient No   DVT prophylaxis: Heparin   Code Status:   full  Family Communication:  No family at the bedside       Consultants:   Nephrology   Cardiology   Procedures:   Right heart cath  HD catheter placement    Subjective: Patient with no worsening dyspnea or edema, no nausea or vomiting, no chest pain, continue to be very weak and deconditioned.,   Objective: Vitals:   10/20/20 0400 10/20/20 0707 10/20/20 0716 10/20/20 1207  BP: 99/61 112/62 112/62   Pulse: 92 92 92   Resp: '16 19 19   '$ Temp: 98 F (36.7 C) 98.4 F (36.9 C) 98.4 F (36.9 C) (!) 97.3 F (36.3 C)  TempSrc: Oral  Oral Oral  SpO2: 97%  95%   Weight: 109.9 kg     Height:        Intake/Output Summary (Last 24 hours) at 10/20/2020 1210 Last data filed at 10/20/2020 [id number] Gross per 24 hour  Intake 1905.42 ml  Output 326 ml  Net 1579.42 ml   Filed Weights   10/18/20 1614 10/19/20 0339 10/20/20 0400  Weight: 108.9 kg 108.4 kg 109.9 kg    Examination:   General: Not in pain or dyspnea, deconditioned  Neurology: Awake and alert, non focal  E ENT: mild pallor, no icterus, oral mucosa moist Cardiovascular: No JVD. S1-S2 present, rhythmic, no gallops, rubs, or murmurs. ++ bilateral lower extremity edema. Unna boots in place.  Pulmonary: positive breath sounds bilaterally, with no wheezing, rhonchi or rales. Decreased breath sounds at bases.  Gastrointestinal. Abdomen protuberant, non tender Skin. No rashes Musculoskeletal: no joint deformities     Data Reviewed: I have personally reviewed following labs and  imaging studies  CBC: Recent Labs  Lab 10/16/20 0500 10/16/20 1318 10/17/20 0456 10/18/20 0500 10/19/20 0500 10/20/20 0309  WBC 5.4  --  5.2 4.8 5.5 5.1  HGB 10.3* 11.6*  10.9* 9.9* 9.5* 9.8* 9.9*  HCT 30.7* 34.0*  32.0* 29.6* 27.7* 29.1* 29.0*  MCV 83.4  --  82.9 82.7 83.6 83.1  PLT 143*  --  150 126* 134* Q000111Q*   Basic Metabolic Panel: Recent Labs  Lab 10/16/20 0500 10/16/20 1318 10/17/20 0456 10/18/20 0500 10/19/20 0500 10/20/20 0309  NA 133* 133*  136 127* 130* 132* 131*  K 3.6 3.7  3.4* 3.5 3.1* 3.5 3.8  CL 100  --  93* 94* 98 99  CO2 23  --  20* '24 23 23  '$ GLUCOSE 134*  --  318* 275* 112* 105*  BUN 54*  --  52* 31* 38* 41*  CREATININE 3.48*  --  3.52* 2.72* 3.52* 4.29*  CALCIUM 8.1*  --  7.7* 7.7* 8.0* 7.9*   GFR: Estimated Creatinine Clearance: 16.8 mL/min (A) (by C-G formula based on SCr of 4.29 mg/dL (H)). Liver Function Tests: No results for input(s): AST, ALT, ALKPHOS, BILITOT,  PROT, ALBUMIN in the last 168 hours. No results for input(s): LIPASE, AMYLASE in the last 168 hours. No results for input(s): AMMONIA in the last 168 hours. Coagulation Profile: No results for input(s): INR, PROTIME in the last 168 hours. Cardiac Enzymes: No results for input(s): CKTOTAL, CKMB, CKMBINDEX, TROPONINI in the last 168 hours. BNP (last 3 results) No results for input(s): PROBNP in the last 8760 hours. HbA1C: No results for input(s): HGBA1C in the last 72 hours. CBG: Recent Labs  Lab 10/19/20 1208 10/19/20 2058 10/20/20 0001 10/20/20 0402 10/20/20 0715  GLUCAP 126* 239* 123* 107* 106*   Lipid Profile: No results for input(s): CHOL, HDL, LDLCALC, TRIG, CHOLHDL, LDLDIRECT in the last 72 hours. Thyroid Function Tests: No results for input(s): TSH, T4TOTAL, FREET4, T3FREE, THYROIDAB in the last 72 hours. Anemia Panel: No results for input(s): VITAMINB12, FOLATE, FERRITIN, TIBC, IRON, RETICCTPCT in the last 72 hours.    Radiology Studies: I have reviewed  all of the imaging during this hospital visit personally     Scheduled Meds: . atorvastatin  20 mg Oral Daily  . Chlorhexidine Gluconate Cloth  6 each Topical Q0600  . insulin aspart  0-6 Units Subcutaneous Q4H  . insulin glargine  10 Units Subcutaneous Daily  . metolazone  5 mg Oral Once  . pantoprazole  40 mg Oral Daily  . sodium chloride flush  3 mL Intravenous Q12H  . sodium chloride flush  3 mL Intravenous Q12H   Continuous Infusions: . sodium chloride    . amiodarone 30 mg/hr (10/20/20 0741)  . furosemide (LASIX) 200 mg in dextrose 5% 100 mL ('2mg'$ /mL) infusion 30 mg/hr (10/20/20 0741)  . heparin 1,400 Units/hr (10/20/20 0948)  . milrinone 0.125 mcg/kg/min (10/20/20 0952)     LOS: 13 days        Aldona Bryner Gerome Apley, MD

## 2020-10-20 NOTE — Progress Notes (Signed)
[facility] Progress Note    Assessment/ Plan:   Assessment/Recommendations: Kyle Hicks is a/an 80 y.o. 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 Fib/A. Flutter, CKD Stage 3a, T2DM, HTN, obesity.  1. Non-Oliguric AKI: Likely a combination of hypoperfusion secondary to tachycardia and vascular congestion secondary to cardiorenal syndrome. No uremic symptoms however has not had adequate diuresis despite high dose lasix gtt and inotropic support.  -s/p temp HD line 3/24, had UF 3/25 and 3/26 for diuretic refractory volume overload -remains on milrinone and lasix gtt -Remains volume overloaded and oliguric so will plan for additional UF in the next 24h, ideally today.  Will give metolazone '5mg'$  po now too as it may be some time prior to UF -Continue to monitor for improvement in renal function and avoid further insults by avoiding nephrotoxins, avoiding hypotension. Cont to hold RAAS inhibition fo rnow.  -Monitor Daily I/Os, Daily weight  -With advanced age and significant comorbids dialysis wouldn't be easy but his pre hospital functional status is good - independent living, driving.  Plan is to eval through the week and if has ongoing RRT needs will discuss with him in more detail what that would look like so he can make an informed decision if it's needed.   A particular challenge would be if milrinone is needed outpt.   2. 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  UACR initially normal in 2018; markedly elevated in 2021 at 612. Additional differential includes obesity related GN.  Baseline creatinine was previously 1.2 - 1.6.   3. Acute Biventricular Heart Failure: Previous hx of HFpEF with progression to HFrEF with EF of 10-15%; suspected to be in the setting of A. Fib with RVR. Significantly volume overloaded with  difficulty diuresing. He is currently on Lasix gtt and Milrinone with Metolazane prn -HF following -UF per above  4. Hyponatremia (hypervolemic) -improved with UF, currently mildly hyponatremic  5. A. Fib with RVR: Failed DCCV on 3/18. Plan to repeat if remains in AF. Currently on Amiodarone.   6. Type 2 Diabetes Mellitus: -per primary  7. Hx of HTN: Previously on HCTZ, Nifedipine and Ramipril. Currently all three are being held. BP has been soft during this admission. 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 At that time, patient refused further evaluation. LFTs WNL on admission.  Defer to primary  Dispo:    Kyle Hick MD [facility] Pager [phone]   Subjective:   UOP yesterday only 340m despite optimization of milrinone/CO. Wt up this AM.  He denies uremic symptoms.    Objective:   BP 112/62 (BP Location: Left Arm)   Pulse 92   Temp 98.4 F (36.9 C) (Oral)   Resp 19   Ht '5\' 6"'$  (1.676 m)   Wt 109.9 kg   SpO2 95%   BMI 39.11 kg/m   Intake/Output Summary (Last 24 hours) at 10/20/2020 1107 Last data filed at 10/20/2020 [id number] per 24 hour  Intake 2025.42 ml  Output 326 ml  Net 1699.42 ml   Weight change: -1.5 kg  Physical Exam: [id number] gentleman, sitting up in bed CVS: irregularly irregular. No murmurs Resp:No wheezing or rales. No respiratory distress.  Abd: obese, soft, nt  Ext: 1+ pitting edema of 4 extr Neuro: awake, interactive, speech  clear and coherent Access:  RIJ trialysis c/d/i  Imaging: No results found. Labs: BMET Recent Labs  Lab 10/15/20 0431 10/15/20 1300 10/16/20 0500 10/16/20 1318 10/17/20 0456 10/18/20 0500 10/19/20 0500 10/20/20 0309  NA 132* 134* 133* 133*  136 127* 130* 132* 131*  K 3.4* 3.8 3.6 3.7  3.4* 3.5 3.1* 3.5 3.8  CL 100 101 100  --  93* 94* 98 99  CO2 21* 21* 23  --  20* '24 23 23  '$ GLUCOSE 128* 152* 134*   --  318* 275* 112* 105*  BUN 54* 54* 54*  --  52* 31* 38* 41*  CREATININE 3.25* 3.35* 3.48*  --  3.52* 2.72* 3.52* 4.29*  CALCIUM 8.1* 8.2* 8.1*  --  7.7* 7.7* 8.0* 7.9*   CBC Recent Labs  Lab 10/17/20 0456 10/18/20 0500 10/19/20 0500 10/20/20 0309  WBC 5.2 4.8 5.5 5.1  HGB 9.9* 9.5* 9.8* 9.9*  HCT 29.6* 27.7* 29.1* 29.0*  MCV 82.9 82.7 83.6 83.1  PLT 150 126* 134* 133*    Medications:    . atorvastatin  20 mg Oral Daily  . Chlorhexidine Gluconate Cloth  6 each Topical Q0600  . insulin aspart  0-6 Units Subcutaneous Q4H  . insulin glargine  10 Units Subcutaneous Daily  . pantoprazole  40 mg Oral Daily  . sodium chloride flush  3 mL Intravenous Q12H  . sodium chloride flush  3 mL Intravenous Q12H   10/20/2020, 11:07 AM

## 2020-10-20 NOTE — Progress Notes (Signed)
Orthopedic Tech Progress Note Patient Details:  Kyle Hicks [birth year] [id number]  Ortho Devices Type of Ortho Device: Louretta Parma boot Ortho Device/Splint Location: bi lateral Ortho Device/Splint Interventions: Ordered,Application,Adjustment   Post Interventions Patient Tolerated: Well Instructions Provided: Care of device,Adjustment of device   Karolee Stamps 10/20/2020, 10:31 PM

## 2020-10-20 NOTE — Progress Notes (Signed)
[place] for Xarelto>Heparin Indication: atrial fibrillation  No Known Allergies  Patient Measurements: Height: '5\' 6"'$  (167.6 cm) Weight: 109.9 kg (242 lb 4.6 oz) IBW/kg (Calculated) : 63.8 Heparin Dosing Weight: 89.3 kg  Vital Signs: Temp: 98 F (36.7 C) (03/28 0400) Temp Source: Oral (03/28 0400) BP: 99/61 (03/28 0400) Pulse Rate: 92 (03/28 0400)  Labs: Recent Labs    10/18/20 0500 10/19/20 0500 10/20/20 0309  HGB 9.5* 9.8* 9.9*  HCT 27.7* 29.1* 29.0*  PLT 126* 134* 133*  HEPARINUNFRC 0.58 0.30 0.26*  CREATININE 2.72* 3.52* 4.29*    Estimated Creatinine Clearance: 16.8 mL/min (A) (by C-G formula based on SCr of 4.29 mg/dL (H)).   Assessment: 80 yo M with new Afib with RVR. No AC noted PTA. Was started on heparin then switched to Xarelto 3/16 and received 1 dose. Pharmacy asked to switch back to IV heparin. Patient now s/p unsuccessful DCCV. Plans to try again once adequately diuresed.  Heparin level now slightly subtherapeutic at 0.26 on drip rate 1350 units/hr. Hgb stable 9s, pltc stable 130s. No overt bleeding or infusion issues noted.  Goal of Therapy:  Heparin level 0.3-0.7 units/ml Monitor platelets by anticoagulation protocol: Yes   Plan:  Increase IV heparin to 1400 units/hr  Monitor daily HL, CBC, s/sx bleeding  Richardine Service, PharmD, BCPS PGY2 Cardiology Pharmacy Resident Phone: [phone] 10/20/2020  7:10 AM  Please check [email or web address] for unit-specific pharmacy phone numbers.

## 2020-10-20 NOTE — Care Management Important Message (Signed)
Important Message  Patient Details  Name: Kyle Hicks MRN: [id number] Date of Birth: Jun 03, 1941   Medicare Important Message Given:  Yes     Shelda Altes 10/20/2020, 8:10 AM

## 2020-10-20 NOTE — Progress Notes (Addendum)
Patient ID: Kyle Hicks, male   DOB: 09/08/1940, 80 y.o.   MRN: [id number]     Advanced Heart Failure Rounding Note  PCP-Cardiologist: No primary care provider on file.    Patient Profile   80 y/o male with obesity and HTN. Admitted with acute HF in setting of AF with RVR of unknown duration.   Echo LVEF 10-15%. RV severely reduced. PICC placed with initial co-ox 40% suggesting markedly low output. CVP up  Subjective:    TEE 3/18 w/ severe Biv dysfunction, Mod-severe MR, severe TR. No LAA thrombus. DCCV unsuccessful  3/24 Diuresing with lasix drip and poor response.  RHC with elevated filling pressures R>L HF , CO 6/CI 2.7. Creatinine 3.5  Temp HD cath placed.    Remains on Amio gtt at 60/hr+ lasix drip at 30 mg /hour + milrinone 0.25. Co-ox 73%. Rec'd Ultrafiltration x2 so far, about 6L removed, no UF yesterday.  Poor  Urine output yesterday.  ECG reviewed appears to be sinus tach vs atrial tach with 1st degree av block, RBBB.  Cr up 4.3, CVP 14, weight up 3 lbs.  Denies shortness of breath, looks comfortable.      Objective:   Weight Range: 109.9 kg Body mass index is 39.11 kg/m.   Vital Signs:   Temp:  [98 F (36.7 C)-98.4 F (36.9 C)] 98.4 F (36.9 C) (03/28 0716) Pulse Rate:  [85-106] 92 (03/28 0716) Resp:  [16-21] 19 (03/28 0716) BP: (94-112)/(56-69) 112/62 (03/28 0716) SpO2:  [94 %-99 %] 95 % (03/28 0716) Weight:  [109.9 kg] 109.9 kg (03/28 0400) Last BM Date: 10/19/20  Weight change: Filed Weights   10/18/20 1614 10/19/20 0339 10/20/20 0400  Weight: 108.9 kg 108.4 kg 109.9 kg    Intake/Output:   Intake/Output Summary (Last 24 hours) at 10/20/2020 0809 Last data filed at 10/20/2020 0741 Gross per 24 hour  Intake 2025.42 ml  Output 326 ml  Net 1699.42 ml      Physical Exam  CVP 13-14 General: NAD Cardiac: JVD 13, normal rate and rhythm, clear s1 and s2, no murmurs, rubs or gallops, upper and lower extremity edema, legs in unna boots Pulmonary:  anterior breath sounds clear, not in distress Abdominal: non distended abdomen, soft and nontender Psych: Alert, conversant, in good spirits    Telemetry   Sinus tach vs ectopic atrial rhythm 90's-low 100's rate, WAT episode last night  Labs    CBC Recent Labs    10/19/20 0500 10/20/20 0309  WBC 5.5 5.1  HGB 9.8* 9.9*  HCT 29.1* 29.0*  MCV 83.6 83.1  PLT 134* Q000111Q*   Basic Metabolic Panel Recent Labs    10/19/20 0500 10/20/20 0309  NA 132* 131*  K 3.5 3.8  CL 98 99  CO2 23 23  GLUCOSE 112* 105*  BUN 38* 41*  CREATININE 3.52* 4.29*  CALCIUM 8.0* 7.9*   Liver Function Tests No results for input(s): AST, ALT, ALKPHOS, BILITOT, PROT, ALBUMIN in the last 72 hours. No results for input(s): LIPASE, AMYLASE in the last 72 hours. Cardiac Enzymes No results for input(s): CKTOTAL, CKMB, CKMBINDEX, TROPONINI in the last 72 hours.  BNP: BNP (last 3 results) Recent Labs    10/07/20 0229  BNP 4,299.0*    ProBNP (last 3 results) No results for input(s): PROBNP in the last 8760 hours.   D-Dimer No results for input(s): DDIMER in the last 72 hours. Hemoglobin A1C No results for input(s): HGBA1C in the last 72 hours. Fasting Lipid Panel  No results for input(s): CHOL, HDL, LDLCALC, TRIG, CHOLHDL, LDLDIRECT in the last 72 hours. Thyroid Function Tests No results for input(s): TSH, T4TOTAL, T3FREE, THYROIDAB in the last 72 hours.  Invalid input(s): FREET3  Other results:   Imaging    No results found.   Medications:     Scheduled Medications: . atorvastatin  20 mg Oral Daily  . Chlorhexidine Gluconate Cloth  6 each Topical Q0600  . insulin aspart  0-6 Units Subcutaneous Q4H  . insulin glargine  10 Units Subcutaneous Daily  . pantoprazole  40 mg Oral Daily  . sodium chloride flush  3 mL Intravenous Q12H  . sodium chloride flush  3 mL Intravenous Q12H  . sodium chloride flush  3 mL Intravenous Q12H    Infusions: . sodium chloride    . sodium chloride     . amiodarone 30 mg/hr (10/20/20 0741)  . furosemide (LASIX) 200 mg in dextrose 5% 100 mL ('2mg'$ /mL) infusion 30 mg/hr (10/20/20 0741)  . heparin 1,400 Units/hr (10/20/20 0742)  . milrinone 0.25 mcg/kg/min (10/20/20 0741)    PRN Medications: sodium chloride, sodium chloride, acetaminophen, acetaminophen, albuterol, dextrose, hydrocortisone cream, ondansetron (ZOFRAN) IV, ondansetron (ZOFRAN) IV, sodium chloride flush, sodium chloride flush, sodium chloride flush  Assessment/Plan   1. A Flutter RVR - Started on amiodarone drip. - Failed attempt at [place] 3/18 - HR 90s-100s today, NSR with PAC's vs ectopic atrial rhythms.   amio decreased to '30mg'$ /hr - TSH normal - Continue IV heparin  2. 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  - Echo this admit w/ severe biventricular HF with EF < 15%.  BNP > 4000 . Suspect tachy-mediated. HS Trop - no trend. No chest pain. No LHC for now. Could consider ischemic work up later if renal function improves. Susepct he may need RHC  - PICC placed initial Co-ox 40%. Started Milrinone 0.25 - Had [place] 3/24 with elevated filling pressures R>L and preserved cardiac output.  - CO-OX 73%. Decrease milrinone to 0.125.  - CVP 13-14 today, poor response to lasix gtt, volume status worsening but breathing is okay likely needs further UF.   - No bb w/ low output  - No arb/dig/spiro with AKI  - c/w unna boots.   3. AKI on CKD Stage IIIa - Creatinine on admit 2.8.  Most recent creatinine was 1.55 back in 03/2020. - started on milrinone for low output - Has temporary HD cath and got UF runs x 2 days.  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 DMII - Hgb A1C 5.9  - On SSI  5. Lower Extremity Edema - Dopplers negative for DVT - 2/2 CHF Continue diuretics + unna boots   6.  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  -Continue PPI  7. HTN  -Soft.   8. Hypervolemic Hyponatremia - Na 131 Restrict free water.   Length of Stay: [place], MD  10/20/2020, 8:09 AM  Advanced Heart Failure Team Pager [phone] (M-F; 7a - 5p)   Patient seen and examined with the above-signed Advanced Practice Provider and/or Housestaff. I personally reviewed laboratory data, imaging studies and relevant notes. I independently examined the patient and formulated the important aspects of the plan. I have edited the note to reflect any of my changes or salient points. I have  personally discussed the plan with the patient and/or family.  Volume status improved with UF but now going back up. Creatinine climbing. Breathing ok. Back in NSR. No bleeding on heparin. Remains on IV amio.  Coox 74% CVP 15  General:  Lying in be. No resp difficulty HEENT: normal Neck: supple. RIJ HD cath Carotids 2+ bilat; no bruits. No lymphadenopathy or thryomegaly appreciated. Cor: PMI nondisplaced. Regular rate & rhythm. 2/6 TR. Lungs: clear Abdomen: soft, nontender, + distended. No hepatosplenomegaly. No bruits or masses. Good bowel sounds. Extremities: no cyanosis, clubbing, rash, 2-3+ edema + UNNA Neuro: alert & orientedx3, cranial nerves grossly intact. moves all 4 extremities w/o difficulty. Affect pleasant  He is back in NSR. Cardiac output optimized with milrinone. The main issue now is his renal function. He appears to be HD dependent. Will have to talk with Renal to see if he is an HD candidate or not. If not, he will need Hospice. Can stop milrinone.   Glori Bickers, MD  11:26 AM

## 2020-10-21 ENCOUNTER — Encounter (HOSPITAL_COMMUNITY): Payer: Self-pay | Admitting: Internal Medicine

## 2020-10-21 DIAGNOSIS — N179 Acute kidney failure, unspecified: Secondary | ICD-10-CM | POA: Diagnosis not present

## 2020-10-21 DIAGNOSIS — I5021 Acute systolic (congestive) heart failure: Secondary | ICD-10-CM | POA: Diagnosis not present

## 2020-10-21 DIAGNOSIS — I4819 Other persistent atrial fibrillation: Secondary | ICD-10-CM | POA: Diagnosis not present

## 2020-10-21 DIAGNOSIS — I5041 Acute combined systolic (congestive) and diastolic (congestive) heart failure: Secondary | ICD-10-CM | POA: Diagnosis not present

## 2020-10-21 DIAGNOSIS — I10 Essential (primary) hypertension: Secondary | ICD-10-CM | POA: Diagnosis not present

## 2020-10-21 LAB — BASIC METABOLIC PANEL
Anion gap: 10 (ref 5–15)
BUN: 45 mg/dL — ABNORMAL HIGH (ref 8–23)
CO2: 23 mmol/L (ref 22–32)
Calcium: 7.9 mg/dL — ABNORMAL LOW (ref 8.9–10.3)
Chloride: 97 mmol/L — ABNORMAL LOW (ref 98–111)
Creatinine, Ser: 4.8 mg/dL — ABNORMAL HIGH (ref 0.61–1.24)
GFR, Estimated: 12 mL/min — ABNORMAL LOW (ref >60)
Glucose, Bld: 95 mg/dL (ref 70–99)
Potassium: 3.7 mmol/L (ref 3.5–5.1)
Sodium: 130 mmol/L — ABNORMAL LOW (ref 135–145)

## 2020-10-21 LAB — MAGNESIUM: Magnesium: 1.5 mg/dL — ABNORMAL LOW (ref 1.7–2.4)

## 2020-10-21 LAB — GLUCOSE, CAPILLARY
Glucose-Capillary: 101 mg/dL — ABNORMAL HIGH (ref 70–99)
Glucose-Capillary: 108 mg/dL — ABNORMAL HIGH (ref 70–99)
Glucose-Capillary: 115 mg/dL — ABNORMAL HIGH (ref 70–99)
Glucose-Capillary: 128 mg/dL — ABNORMAL HIGH (ref 70–99)
Glucose-Capillary: 134 mg/dL — ABNORMAL HIGH (ref 70–99)
Glucose-Capillary: 141 mg/dL — ABNORMAL HIGH (ref 70–99)
Glucose-Capillary: 152 mg/dL — ABNORMAL HIGH (ref 70–99)

## 2020-10-21 LAB — CBC
HCT: 27.7 % — ABNORMAL LOW (ref 39.0–52.0)
Hemoglobin: 9.7 g/dL — ABNORMAL LOW (ref 13.0–17.0)
MCH: 28.9 pg (ref 26.0–34.0)
MCHC: 35 g/dL (ref 30.0–36.0)
MCV: 82.4 fL (ref 80.0–100.0)
Platelets: 136 10*3/uL — ABNORMAL LOW (ref 150–400)
RBC: 3.36 MIL/uL — ABNORMAL LOW (ref 4.22–5.81)
RDW: 19.4 % — ABNORMAL HIGH (ref 11.5–15.5)
WBC: 5.1 10*3/uL (ref 4.0–10.5)
nRBC: 0 % (ref 0.0–0.2)

## 2020-10-21 LAB — HEPARIN LEVEL (UNFRACTIONATED)
Heparin Unfractionated: 0.26 IU/mL — ABNORMAL LOW (ref 0.30–0.70)
Heparin Unfractionated: 0.31 IU/mL (ref 0.30–0.70)

## 2020-10-21 LAB — COOXEMETRY PANEL
Carboxyhemoglobin: 1.2 % (ref 0.5–1.5)
Methemoglobin: 0.9 % (ref 0.0–1.5)
O2 Saturation: 66.8 %
Total hemoglobin: 9.9 g/dL — ABNORMAL LOW (ref 12.0–16.0)

## 2020-10-21 MED ORDER — TORSEMIDE 100 MG PO TABS: 100.0000 mg | ORAL_TABLET | Freq: Once | ORAL | Status: DC

## 2020-10-21 MED ORDER — METOLAZONE 5 MG PO TABS
10.0000 mg | ORAL_TABLET | Freq: Once | ORAL | Status: AC
  Administered 2020-10-21: 10 mg via ORAL
  Filled 2020-10-21: qty 2

## 2020-10-21 MED ORDER — MAGNESIUM SULFATE 2 GM/50ML IV SOLN
2.0000 g | Freq: Once | INTRAVENOUS | Status: AC
  Administered 2020-10-21: 2 g via INTRAVENOUS
  Filled 2020-10-21: qty 50

## 2020-10-21 NOTE — Plan of Care (Signed)
  Problem: Education: Goal: Ability to demonstrate management of disease process will improve Outcome: Progressing Goal: Ability to verbalize understanding of medication therapies will improve Outcome: Progressing Goal: Individualized Educational Video(s) Outcome: Progressing   Problem: Activity: Goal: Capacity to carry out activities will improve Outcome: Progressing   Problem: Cardiac: Goal: Ability to achieve and maintain adequate cardiopulmonary perfusion will improve Outcome: Progressing   Problem: Education: Goal: Knowledge of General Education information will improve Description: Including pain rating scale, medication(s)/side effects and non-pharmacologic comfort measures Outcome: Progressing   Problem: Health Behavior/Discharge Planning: Goal: Ability to manage health-related needs will improve Outcome: Progressing   Problem: Clinical Measurements: Goal: Ability to maintain clinical measurements within normal limits will improve Outcome: Progressing Goal: Will remain free from infection Outcome: Progressing Goal: Diagnostic test results will improve Outcome: Progressing Goal: Respiratory complications will improve Outcome: Progressing Goal: Cardiovascular complication will be avoided Outcome: Progressing   Problem: Activity: Goal: Risk for activity intolerance will decrease Outcome: Progressing   Problem: Nutrition: Goal: Adequate nutrition will be maintained Outcome: Progressing   Problem: Elimination: Goal: Will not experience complications related to bowel motility Outcome: Progressing Goal: Will not experience complications related to urinary retention Outcome: Progressing   Problem: Safety: Goal: Ability to remain free from injury will improve Outcome: Progressing   Problem: Skin Integrity: Goal: Risk for impaired skin integrity will decrease Outcome: Progressing

## 2020-10-21 NOTE — Progress Notes (Signed)
[place] for Xarelto>Heparin Indication: atrial fibrillation  No Known Allergies  Patient Measurements: Height: '5\' 6"'$  (167.6 cm) Weight: 107.5 kg (236 lb 14.4 oz) IBW/kg (Calculated) : 63.8 Heparin Dosing Weight: 89.3 kg  Vital Signs: Temp: 98.5 F (36.9 C) (03/29 0518) Temp Source: Oral (03/29 0518) BP: 101/64 (03/29 0518) Pulse Rate: 73 (03/29 0518)  Labs: Recent Labs    10/19/20 0500 10/20/20 0309 10/21/20 0419  HGB 9.8* 9.9* 9.7*  HCT 29.1* 29.0* 27.7*  PLT 134* 133* 136*  HEPARINUNFRC 0.30 0.26* 0.26*  CREATININE 3.52* 4.29* 4.80*    Estimated Creatinine Clearance: 14.8 mL/min (A) (by C-G formula based on SCr of 4.8 mg/dL (H)).   Assessment: 80 yo M with new Afib with RVR. No AC noted PTA. Was started on heparin then switched to Xarelto 3/16 and received 1 dose. Pharmacy asked to switch back to IV heparin. Patient now s/p unsuccessful DCCV. Plans to try again once adequately diuresed.  Heparin level remains slightly subtherapeutic at 0.26 after increasing drip rate to 1400 units/hr. RN reports that heparin line got occluded multiple times overnight but has been fine during day shift so far. No overt bleeding noted, Hgb stable 9s, pltc stable 130s.   Goal of Therapy:  Heparin level 0.3-0.7 units/ml Monitor platelets by anticoagulation protocol: Yes   Plan:  Continue IV heparin at 1400 units/hr for now Check 8 hr HL Monitor daily HL, CBC, s/sx bleeding  Richardine Service, PharmD, BCPS PGY2 Cardiology Pharmacy Resident Phone: [phone] 10/21/2020  9:07 AM  Please check [email or web address] for unit-specific pharmacy phone numbers.

## 2020-10-21 NOTE — Progress Notes (Addendum)
Per Dr. Arlyss Gandy, patient needs outpatient HD referral for AKI treatment and requests care at [facility]. Navigator discussed with Medical Director at [facility], who agrees to accept if patient is able to tolerate HD off milrinone. Dr. Johnney Ou states patient is off now, next HD scheduled for tomorrow.  Referral submitted to [facility] Admissions to request care at [facility]. Referral will be complete once patient has TDC placed (planned for tomorrow, 10/22/20) and Navigator sends note. Navigator will monitor closely and follow up with patient in regards to referral as well as transportation plans.   Alphonzo Cruise, [place] [facility] Navigator [phone]

## 2020-10-21 NOTE — Consult Note (Signed)
Chief Complaint: Patient was seen in consultation today for image guided tunneled hemodialysis catheter placement  Chief Complaint  Patient presents with  . Weakness  . Fall   at the request of Dr. Johnney Ou, L.   Referring Physician(s): Dr. Johnney Ou, L.   Supervising Physician: Ruthann Cancer  Patient Status: [facility] - In-pt  History of Present Illness: Kyle Hicks is a 80 y.o. male new to our service.  His past medical history includes hypertension, DM, chronic renal insufficiency, and acute on chronic diastolic CHF s/p right heart catheterization and temporary hemodialysis catheter placement by Dr. Haroldine Laws on 10/16/2020.  He presented to [facility] ED on 10/07/2020 with chief complaint of generalized weakness.  He was found to be markedly volume overloaded and tachycardic.  Chart review revealed patient underwent echocardiogram in 2018 which showed EF of 55%.  Cardiology was consulted for tachycardia which did not respond to metoprolol or adenosine.  Patient was admitted for further evaluation of volume overload and tachycardia.   Patient underwent extensive cardiology work-up including TEE, echocardiogram, and right heart catheterization.  A temporary hemodialysis catheter was placed by Dr. Haroldine Laws after the right heart catheterization. Patient was referred to nephrology for further evaluation and management of AKI in a patient with CKD.  IR was requested for image guided tunneled hemodialysis catheter placement by nephrology.  Patient laying in bed, not in acute distress.  Denise headache, fever, chills, shortness of breath, cough, chest pain, abdominal pain, nausea ,vomiting, and bleeding.  Past Medical History:  Diagnosis Date  . Diabetes mellitus without complication ([place])   . Diastolic CHF, acute ([place]) 10/08/2020  . HTN (hypertension)     Past Surgical History:  Procedure Laterality Date  . CARDIOVERSION N/A 10/10/2020   Procedure: CARDIOVERSION;  Surgeon: Lelon Perla, MD;   Location: [facility] ENDOSCOPY;  Service: Cardiovascular;  Laterality: N/A;  . ESOPHAGOGASTRODUODENOSCOPY N/A 04/27/2017   Procedure: ESOPHAGOGASTRODUODENOSCOPY (EGD);  Surgeon: Rogene Houston, MD;  Location: [facility] ENDO SUITE;  Service: Endoscopy;  Laterality: N/A;  . RIGHT HEART CATH N/A 10/16/2020   Procedure: RIGHT HEART CATH;  Surgeon: Jolaine Artist, MD;  Location: [place] CV LAB;  Service: Cardiovascular;  Laterality: N/A;  . TEE WITHOUT CARDIOVERSION N/A 10/10/2020   Procedure: TRANSESOPHAGEAL ECHOCARDIOGRAM (TEE);  Surgeon: Lelon Perla, MD;  Location: [facility] ENDOSCOPY;  Service: Cardiovascular;  Laterality: N/A;  . TEMPORARY DIALYSIS CATHETER  10/16/2020   Procedure: TEMPORARY DIALYSIS CATHETER;  Surgeon: Jolaine Artist, MD;  Location: [place] CV LAB;  Service: Cardiovascular;;    Allergies: Patient has no known allergies.  Medications: Prior to Admission medications   Medication Sig Start Date End Date Taking? Authorizing Provider  Artificial Tear Ointment (DRY EYES OP) Apply 1 drop to eye daily as needed (for dry eyes).   Yes [provider]  atorvastatin (LIPITOR) 20 MG tablet Take 20 mg by mouth daily.   Yes [provider]  glipiZIDE (GLUCOTROL) 5 MG tablet Take 1 tablet by mouth once daily with breakfast Patient taking differently: Take 5 mg by mouth daily before breakfast. 07/16/20  Yes Nida, Marella Chimes, MD  Homeopathic Products [facility]) OINT Apply 1 application topically daily as needed (foot care).   Yes [provider]  NIFEdipine (PROCARDIA-XL/ADALAT-CC/NIFEDICAL-XL) 30 MG 24 hr tablet take 1 tablet by mouth once daily Patient taking differently: Take 30 mg by mouth daily. 02/14/17  Yes Nida, Marella Chimes, MD  ramipril (ALTACE) 10 MG capsule Take 1 capsule by mouth once daily Patient taking  differently: Take 10 mg by mouth daily. 10/09/19  Yes Nida, Marella Chimes, MD  sitaGLIPtin-metformin (JANUMET) 50-1000 MG tablet Take 1/2  (one-half) tablet by mouth twice daily Patient taking differently: Take 0.5 tablets by mouth 2 (two) times daily with a meal. 12/10/19  Yes Nida, Marella Chimes, MD  blood glucose meter kit and supplies KIT Dispense based on patient and insurance preference. Use up to 2 times daily as directed. (FOR ICD-10 E11.65) 04/12/18   Cassandria Anger, MD  glucose blood (ACCU-CHEK GUIDE) test strip Use as instructed bid E11.65 02/17/18   Cassandria Anger, MD  hydrochlorothiazide (HYDRODIURIL) 25 MG tablet TAKE 1 TABLET(25 MG) BY MOUTH DAILY Patient taking differently: Take 25 mg by mouth daily. 06/16/18   Cassandria Anger, MD  Lancets (ACCU-CHEK MULTICLIX) lancets Use as instructed bid. E11.65 02/17/18   Cassandria Anger, MD     Family History  Problem Relation Age of Onset  . Hypertension Mother   . Hyperlipidemia Mother   . Hypertension Father   . Hyperlipidemia Father   . Colon cancer Neg Hx     Social History   Socioeconomic History  . Marital status: Married    Spouse name: Not on file  . Number of children: Not on file  . Years of education: Not on file  . Highest education level: Not on file  Occupational History  . Not on file  Tobacco Use  . Smoking status: Former Smoker    Packs/day: 0.50    Types: Cigarettes    Quit date: 07/26/1987    Years since quitting: 33.2  . Smokeless tobacco: Never Used  Vaping Use  . Vaping Use: Never used  Substance and Sexual Activity  . Alcohol use: No    Alcohol/week: 0.0 standard drinks    Comment: Previously drank socially, last drank 25-30 years ago  . Drug use: No  . Sexual activity: Not on file  Other Topics Concern  . Not on file  Social History Narrative  . Not on file   Social Determinants of Health   Financial Resource Strain: Not on file  Food Insecurity: Not on file  Transportation Needs: Not on file  Physical Activity: Not on file  Stress: Not on file  Social Connections: Not on file     Review of  Systems: A 12 point ROS discussed and pertinent positives are indicated in the HPI above.  All other systems are negative.   Vital Signs: BP 101/64 (BP Location: Left Arm)   Pulse 73   Temp 98.5 F (36.9 C) (Oral)   Resp (!) 21   Ht _0  (1.676 m)   Wt 236 lb 14.4 oz (107.5 kg)   SpO2 95%   BMI 38.24 kg/m   Physical Exam Vitals and nursing note reviewed.  Constitutional:      General: He is not in acute distress.    Appearance: Normal appearance.  Cardiovascular:     Rate and Rhythm: Normal rate and regular rhythm.     Pulses: Normal pulses.     Heart sounds: Normal heart sounds.  Pulmonary:     Effort: Pulmonary effort is normal.     Breath sounds: Normal breath sounds.  Abdominal:     General: Bowel sounds are normal.     Palpations: Abdomen is soft.  Skin:    General: Skin is warm and dry.  Neurological:     General: No focal deficit present.     Mental Status: He is alert  and oriented to person, place, and time.  Psychiatric:        Mood and Affect: Mood normal.        Behavior: Behavior normal.    MD Evaluation Airway: WNL Heart: WNL Heart  comments: EF 25% AF Abdomen: WNL Chest/ Lungs: WNL Other Pertinent Findings: CKD IV ASA  Classification: 3 Mallampati/Airway Score: Two  Imaging: NM Pulmonary Perfusion  Result Date: 10/07/2020 CLINICAL DATA:  Shortness of breath. EXAM: NUCLEAR MEDICINE PERFUSION LUNG SCAN TECHNIQUE: Perfusion images were obtained in multiple projections after intravenous injection of radiopharmaceutical. Views: Anterior, posterior, left lateral, right lateral, RPO, LPO, RAO, LAO. RADIOPHARMACEUTICALS:  4.0 mCi Tc-77mMAA IV COMPARISON:  Chest radiograph October 07, 2020 FINDINGS: Radiotracer uptake is homogeneous and symmetric bilaterally. No perfusion defects evident. IMPRESSION: No perfusion defects evident. No findings indicative of pulmonary embolus. Electronically Signed   By: WLowella GripIII M.D.   On: 10/07/2020 11:02    CARDIAC CATHETERIZATION  Result Date: 10/16/2020 Findings: On milrinone 0.25 mcg/kg/min RA = 15 RV = 52/16 PA = 47/23 (33) PCW = 22 Fick cardiac output/index = 6.0/2.7 Thermo CO/CI = 6.9/3.1 PVR = 1.8 WU Ao sat = 98% PA sat = 62%, 63% PaPi = 1.6 Assessment: 1. Elevated biventricular filling pressures with R>L HF and normal cardiac output Plan/Discussion: He remains markedly volume overloaded with poor response to IV diuresis. HD catheter placed for UF. DGlori Bickers MD 1:51 PM   [place]  Result Date: 10/14/2020 CLINICAL DATA:  Acute renal insufficiency EXAM: RENAL / URINARY TRACT ULTRASOUND COMPLETE COMPARISON:  06/22/2017 FINDINGS: Right Kidney: Renal measurements: 11.1 x 5.4 x 6.2 cm = volume: 194 mL. Normal cortical thickness for age. Slightly increased echogenicity. Left Kidney: Renal measurements: 9.6 x 5.6 x 4.4 cm = volume: 124 mL. Renal cortical thinning. Lower pole 3.5 cm cyst. Bladder: Appears normal for degree of bladder distention. Other: Right-sided pleural effusion incidentally noted. Decreased study quality secondary to patient body habitus. IMPRESSION: 1.  No hydronephrosis. 2. Left renal cortical thinning with increased right renal cortical echogenicity, suggesting medical renal disease. 3. Right pleural effusion. 4. Decreased sensitivity and specificity exam due to technique/patient related factors, as described above. Electronically Signed   By: KAbigail MiyamotoM.D.   On: 10/14/2020 18:08   PERIPHERAL VASCULAR CATHETERIZATION  Result Date: 10/16/2020 Findings: On milrinone 0.25 mcg/kg/min RA = 15 RV = 52/16 PA = 47/23 (33) PCW = 22 Fick cardiac output/index = 6.0/2.7 Thermo CO/CI = 6.9/3.1 PVR = 1.8 WU Ao sat = 98% PA sat = 62%, 63% PaPi = 1.6 Assessment: 1. Elevated biventricular filling pressures with R>L HF and normal cardiac output Plan/Discussion: He remains markedly volume overloaded with poor response to IV diuresis. HD catheter placed for UF. DGlori Bickers MD 1:51 PM    DG CHEST PORT 1 VIEW  Result Date: 10/09/2020 CLINICAL DATA:  PICC line placement. EXAM: PORTABLE CHEST 1 VIEW COMPARISON:  02/23/2013 FINDINGS: Right arm PICC line tip is in the projection of the distal SVC. Stable mild cardiac enlargement. Small right pleural effusion unchanged from previous exam. No airspace opacities or interstitial edema. IMPRESSION: 1. Tip of right arm PICC line is in the projection of the distal SVC. 2. Persistent small right pleural effusion. Electronically Signed   By: TKerby MoorsM.D.   On: 10/09/2020 11:07   DG Chest Portable 1 View  Result Date: 10/07/2020 CLINICAL DATA:  [place] short of breath, tachycardia EXAM: PORTABLE CHEST 1 VIEW COMPARISON:  02/23/2013 FINDINGS: Single  frontal view of the chest demonstrates an enlarged cardiac silhouette. Small right pleural effusion obscures the costophrenic angle. No airspace disease or pneumothorax. No acute bony abnormalities. IMPRESSION: 1. Enlarged cardiac silhouette. 2. Small right pleural effusion. Electronically Signed   By: Randa Ngo M.D.   On: 10/07/2020 02:53   ECHOCARDIOGRAM COMPLETE  Result Date: 10/08/2020    ECHOCARDIOGRAM REPORT   Patient Name:   Kyle Hicks Date of Exam: 10/08/2020 Medical Rec #:  [id number]      Height:       66.0 in Accession #:    [id number]     Weight:       260.4 lb Date of Birth:  [birth year]      BSA:          2.237 m Patient Age:    89 years       BP:           117/91 mmHg Patient Gender: M              HR:           131 bpm. Exam Location:  Inpatient Procedure: 2D Echo, Cardiac Doppler, Color Doppler and Intracardiac            Opacification Agent Indications:    I50.40* Unspecified combined systolic (congestive) and diastolic                 (congestive) heart failure  History:        Patient has prior history of Echocardiogram examinations, most                 recent 04/25/2017. Abnormal ECG, Arrythmias:Atrial Fibrillation,                 Signs/Symptoms:Chest Pain; Risk  Factors:Dyslipidemia, Diabetes                 and Hypertension. Elevated troponin.  Sonographer:    Roseanna Rainbow RDCS Referring Phys: [id number] RONDELL A SMITH  Sonographer Comments: Technically difficult study due to poor echo windows and patient is morbidly obese. Image acquisition challenging due to patient body habitus. IMPRESSIONS  1. Left ventricular ejection fraction, by estimation, is 10-15%. The left ventricle has severely decreased function. The left ventricle demonstrates global hypokinesis. There is mild concentric left ventricular hypertrophy. Left ventricular diastolic function could not be evaluated.  2. Right ventricular systolic function is severely reduced. The right ventricular size is mildly enlarged. There is moderately elevated pulmonary artery systolic pressure. The estimated right ventricular systolic pressure is 97.0 mmHg.  3. Left atrial size was moderately dilated.  4. The mitral valve is grossly normal. Mild mitral valve regurgitation. No evidence of mitral stenosis.  5. The aortic valve is tricuspid. Aortic valve regurgitation is mild. No aortic stenosis is present.  6. 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  7. The inferior vena cava is dilated in size with <50% respiratory variability, suggesting right atrial pressure of 15 mmHg. Comparison(s): Changes from prior study are noted. EF now severely reduced. Afib with RVR now present. FINDINGS  Left Ventricle: Left ventricular ejection fraction, by estimation, is 10-15%. The left ventricle has severely decreased function. The left ventricle demonstrates global hypokinesis. Definity contrast agent was given IV to delineate the left ventricular endocardial borders. The left ventricular internal cavity size was normal in size. There is mild concentric left ventricular hypertrophy. Left ventricular diastolic function could not be evaluated due to atrial fibrillation. Left ventricular  diastolic function could not be  evaluated. Right Ventricle: The right ventricular size is mildly enlarged. No increase in right ventricular wall thickness. Right ventricular systolic function is severely reduced. There is moderately elevated pulmonary artery systolic pressure. The tricuspid regurgitant velocity is 2.92 m/s, and with an assumed right atrial pressure of 15 mmHg, the estimated right ventricular systolic pressure is 86.5 mmHg. Left Atrium: Left atrial size was moderately dilated. Right Atrium: Right atrial size was normal in size. Pericardium: Trivial pericardial effusion is present. Mitral Valve: The mitral valve is grossly normal. Mild mitral valve regurgitation. No evidence of mitral valve stenosis. Tricuspid Valve: The tricuspid valve is grossly normal. Tricuspid valve regurgitation is mild . No evidence of tricuspid stenosis. Aortic Valve: The aortic valve is tricuspid. Aortic valve regurgitation is mild. No aortic stenosis is present. Pulmonic Valve: The pulmonic valve was grossly normal. Pulmonic valve regurgitation is trivial. No evidence of pulmonic stenosis. Aorta: The aortic root is normal in size and structure. 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 Venous: The inferior vena cava is dilated in size with less than 50% respiratory variability, suggesting right atrial pressure of 15 mmHg. IAS/Shunts: The atrial septum is grossly normal. EKG: Rhythm strip during this exam demostrated atrial fibrillation.  LEFT VENTRICLE PLAX 2D LVIDd:         5.40 cm LVIDs:         5.10 cm LV PW:         1.60 cm LV IVS:        1.20 cm LVOT diam:     2.10 cm LV SV:         41 LV SV Index:   18 LVOT Area:     3.46 cm  RIGHT VENTRICLE            IVC RV S prime:     9.67 cm/s  IVC diam: 3.10 cm TAPSE (M-mode): 0.7 cm LEFT ATRIUM              Index       RIGHT ATRIUM           Index LA diam:        3.90 cm  1.74 cm/m  RA Area:     22.40 cm LA Vol (A2C):   129.0 ml 57.67 ml/m RA Volume:   66.40 ml  29.68 ml/m LA Vol  (A4C):   86.8 ml  38.80 ml/m LA Biplane Vol: 109.0 ml 48.73 ml/m  AORTIC VALVE LVOT Vmax:   80.90 cm/s LVOT Vmean:  55.100 cm/s LVOT VTI:    0.117 m  AORTA Ao Root diam: 3.60 cm Ao Asc diam:  4.30 cm MITRAL VALVE                 TRICUSPID VALVE MV Area (PHT): 6.52 cm      TR Peak grad:   34.1 mmHg MV Decel Time: 116 msec      TR Vmax:        292.00 cm/s MR Peak grad:    71.6 mmHg MR Mean grad:    44.0 mmHg   SHUNTS MR Vmax:         423.00 cm/s Systemic VTI:  0.12 m MR Vmean:        316.0 cm/s  Systemic Diam: 2.10 cm MR PISA:         0.25 cm MR PISA Eff ROA: 2 mm MR PISA Radius:  0.20 cm MV E velocity: 119.33  cm/s Eleonore Chiquito MD Electronically signed by Eleonore Chiquito MD Signature Date/Time: 10/08/2020/5:30:08 PM    Final    ECHO TEE  Result Date: 10/10/2020    TRANSESOPHOGEAL ECHO REPORT   Patient Name:   Kyle Hicks Date of Exam: 10/10/2020 Medical Rec #:  [id number]      Height:       66.0 in Accession #:    [id number]     Weight:       257.1 lb Date of Birth:  02/09/1941      BSA:          2.225 m Patient Age:    18 years       BP:           126/75 mmHg Patient Gender: M              HR:           127 bpm. Exam Location:  Inpatient Procedure: 2D Echo, Cardiac Doppler and Color Doppler Indications:     R94.31 Abnormal EKG; I48.91* Unspeicified atrial fibrillation  History:         Patient has prior history of Echocardiogram examinations, most                  recent 10/08/2020. CHF; Risk Factors:Hypertension, Diabetes and                  Dyslipidemia.  Sonographer:     Roseanna Rainbow RDCS Referring Phys:  [place] Diagnosing Phys: Kirk Ruths MD PROCEDURE: After discussion of the risks and benefits of a TEE, an informed consent was obtained from the patient. The transesophogeal probe was passed without difficulty through the esophogus of the patient. Imaged were obtained with the patient in a left lateral decubitus position. Sedation performed by different physician. The patient was monitored  while under deep sedation. The patient developed no complications during the procedure. An unsuccessful direct current cardioversion was performed at 200 joules with 3 attempts. Ketamine 56m. IMPRESSIONS  1. Left ventricular ejection fraction, by estimation, is <20%. The left ventricle has severely decreased function. The left ventricle demonstrates global hypokinesis. The left ventricular internal cavity size was mildly dilated.  2. Right ventricular systolic function is severely reduced. The right ventricular size is severely enlarged.  3. Left atrial size was moderately dilated. No left atrial/left atrial appendage thrombus was detected.  4. Right atrial size was moderately dilated.  5. The mitral valve is abnormal. 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  6. Tricuspid valve regurgitation is severe.  7. The aortic valve is tricuspid. Aortic valve regurgitation is mild. Mild aortic valve sclerosis is present, with no evidence of aortic valve stenosis.  8. Aortic dilatation noted. There is mild dilatation of the aortic root, measuring 40 mm. FINDINGS  Left Ventricle: Left ventricular ejection fraction, by estimation, is <20%. The left ventricle has severely decreased function. The left ventricle demonstrates global hypokinesis. The left ventricular internal cavity size was mildly dilated. Right Ventricle: The right ventricular size is severely enlarged. Right vetricular wall thickness was not assessed. Right ventricular systolic function is severely reduced. Left Atrium: Left atrial size was moderately dilated. Spontaneous echo contrast was present in the left atrium and left atrial appendage. No left atrial/left atrial appendage thrombus was detected. Right Atrium: Right atrial size was moderately dilated. Pericardium: There is no evidence of pericardial effusion. Mitral Valve: The mitral valve is abnormal. 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 Tricuspid Valve: The tricuspid  valve is normal in  structure. Tricuspid valve regurgitation is severe. Aortic Valve: The aortic valve is tricuspid. Aortic valve regurgitation is mild. Mild aortic valve sclerosis is present, with no evidence of aortic valve stenosis. Pulmonic Valve: The pulmonic valve was grossly normal. Pulmonic valve regurgitation is trivial. Aorta: Aortic dilatation noted. There is mild dilatation of the aortic root, measuring 40 mm. IAS/Shunts: No atrial level shunt detected by color flow Doppler. Kirk Ruths MD Electronically signed by Kirk Ruths MD Signature Date/Time: 10/10/2020/10:31:47 AM    Final    VAS [place] LOWER EXTREMITY VENOUS (DVT)  Result Date: 10/09/2020  Lower Venous DVT Study Indications: Edema, and elevated D-dimer.  Risk Factors: CHF, CKD3. Limitations: Body habitus, poor ultrasound/tissue interface and Difficulty getting color fill likely due to habitus. Comparison Study: No previous exams Performing Technologist: Rogelia Rohrer  Examination Guidelines: A complete evaluation includes B-mode imaging, spectral Doppler, color Doppler, and power Doppler as needed of all accessible portions of each vessel. Bilateral testing is considered an integral part of a complete examination. Limited examinations for reoccurring indications may be performed as noted. The reflux portion of the exam is performed with the patient in reverse Trendelenburg.  +---------+---------------+---------+-----------+----------+--------------+ RIGHT    CompressibilityPhasicitySpontaneityPropertiesThrombus Aging +---------+---------------+---------+-----------+----------+--------------+ CFV      Full           Yes      Yes                                 +---------+---------------+---------+-----------+----------+--------------+ SFJ      Full                                                        +---------+---------------+---------+-----------+----------+--------------+ FV Prox  Full           Yes      Yes                                  +---------+---------------+---------+-----------+----------+--------------+ FV Mid   Full           Yes      Yes                                 +---------+---------------+---------+-----------+----------+--------------+ FV DistalFull           Yes      Yes                                 +---------+---------------+---------+-----------+----------+--------------+ PFV      Full                                                        +---------+---------------+---------+-----------+----------+--------------+ POP      Full           Yes      Yes                                 +---------+---------------+---------+-----------+----------+--------------+  PTV      Full                                                        +---------+---------------+---------+-----------+----------+--------------+ PERO     Full                                                        +---------+---------------+---------+-----------+----------+--------------+   +---------+---------------+---------+-----------+----------+--------------+ LEFT     CompressibilityPhasicitySpontaneityPropertiesThrombus Aging +---------+---------------+---------+-----------+----------+--------------+ CFV      Full           Yes      Yes                                 +---------+---------------+---------+-----------+----------+--------------+ SFJ      Full                                                        +---------+---------------+---------+-----------+----------+--------------+ FV Prox  Full           Yes      Yes                                 +---------+---------------+---------+-----------+----------+--------------+ FV Mid   Full           Yes      Yes                                 +---------+---------------+---------+-----------+----------+--------------+ FV DistalFull           Yes      Yes                                  +---------+---------------+---------+-----------+----------+--------------+ PFV      Full                                                        +---------+---------------+---------+-----------+----------+--------------+ POP      Full           Yes      Yes                                 +---------+---------------+---------+-----------+----------+--------------+ PTV      Full                                                        +---------+---------------+---------+-----------+----------+--------------+  PERO     Full                                                        +---------+---------------+---------+-----------+----------+--------------+     Summary: BILATERAL: - No evidence of deep vein thrombosis seen in the lower extremities, bilaterally. - No evidence of superficial venous thrombosis in the lower extremities, bilaterally. -No evidence of popliteal cyst, bilaterally.   *See table(s) above for measurements and observations. Electronically signed by Servando Snare MD on 10/09/2020 at 6:01:59 PM.    Final    [place] EKG SITE RITE  Result Date: 10/09/2020 If Site Rite image not attached, placement could not be confirmed due to current cardiac rhythm.   Labs:  CBC: Recent Labs    10/18/20 0500 10/19/20 0500 10/20/20 0309 10/21/20 0419  WBC 4.8 5.5 5.1 5.1  HGB 9.5* 9.8* 9.9* 9.7*  HCT 27.7* 29.1* 29.0* 27.7*  PLT 126* 134* 133* 136*    COAGS: Recent Labs    10/09/20 1220 10/09/20 2300 10/10/20 0157 10/10/20 1420  APTT 35 145* 199* 91*    BMP: Recent Labs    04/08/20 0724 10/07/20 0229 10/18/20 0500 10/19/20 0500 10/20/20 0309 10/21/20 0419  NA 143   < > 130* 132* 131* 130*  K 4.2   < > 3.1* 3.5 3.8 3.7  CL 109   < > 94* 98 99 97*  CO2 27   < > _0 GLUCOSE 144*   < > 275* 112* 105* 95  BUN 16   < > 31* 38* 41* 45*  CALCIUM 9.0   < > 7.7* 8.0* 7.9* 7.9*  CREATININE 1.55*   < > 2.72* 3.52* 4.29* 4.80*  GFRNONAA 43*   < > 23* 17* 14*  12*  GFRAA 49*  --   --   --   --   --    < > = values in this interval not displayed.    LIVER FUNCTION TESTS: Recent Labs    04/08/20 0724 10/07/20 0229  BILITOT 0.5 1.4*  AST 14 29  ALT 11 26  ALKPHOS  --  49  PROT 6.8 7.0  ALBUMIN  --  3.4*    TUMOR MARKERS: No results for input(s): AFPTM, CEA, CA199, CHROMGRNA in the last 8760 hours.  Assessment and Plan:  Acute HF with volume overload in a patient with history of diastolic CHF, AKI in a patient with history of CKD  Patient has a non tunneled hemodialysis catheter placed by Dr. Haroldine Laws on 10/10/20.  Request was made by nephrology for image guided tunneled hemodialysis catheter placement.  Labs and vitals reviewed -  WBC w/in normal limit, afebrile, no sign of acute infection.  Made npo at midnight by nephrology.  On heparin drip, no need for d/c per IR anticoagulation protocol, confirmed with Dr. Serafina Royals.  The procedure is tentatively scheduled for 10/22/20 pending IR schedule.   Risks and benefits discussed with the patient including, but not limited to bleeding, infection, vascular injury, pneumothorax which may require chest tube placement, air embolism or even death  All of the patient's questions were answered, patient is agreeable to proceed. Consent signed and in chart.   Thank you for this interesting consult.  I greatly enjoyed meeting DYSHAWN CANGELOSI and look forward to participating in  their care.  A copy of this report was sent to the requesting provider on this date.  Electronically Signed: Tera Mater, PA-C 10/21/2020, 10:14 AM   I spent a total of 20 Minutes   in face to face in clinical consultation, greater than 50% of which was counseling/coordinating care for tunneled hemodialysis catheter placement.

## 2020-10-21 NOTE — Progress Notes (Signed)
PROGRESS NOTE    Kyle Hicks  [id number] DOB: 10-22-1940 DOA: 10/07/2020 PCP: Sharilyn Sites, MD    Brief Narrative:  Kyle Hicks admitted to the hospitalwith theworking diagnosis of acute on chronic systolic heart failure decompensation, complicated with pulmonary HTN class 2, acute core pulmonale, biventricular failure..  80 year old male past medical history for hypertension, heart failure, type 2 diabetes mellitus and history of GI bleed who presented after mechanical fall. On the day of admission he got up to use the restroom, fell a week and sustained a mechanical fall. No head trauma or loss of consciousness. Reports lower extremity edema, dyspnea, wheezing or palpitations. Positive diarrhea. On his initial physical examination heart rate 129-134, respiratory rate 19-43, blood pressure 105/73-130 8/108, oxygen saturation 98% on supplemental oxygen. His lungs had no wheezing or rales, heart S1-S2, present, rhythmic, tachycardic, abdomen soft, positive lower extremity edema.  Chest radiograph with cardiomegaly, positive hilar vascular congestion, small right pleural effusion.  EKG 133 bpm, left axis deviation, Q waves lead II, lead III, aVF, prolonged QTc545, right bundle branch block,atrial flutter, no ST segment changes, negative T wave V1-V3.  Patientsuspected low output heart failure andstarted on milrinone infusion along with furosemide drip. Amiodarone drip for rate control atrial flutter.  Right heart cath with PCW 22 with PA mean 33 and cardiac output 6.0 per Fick and 6.9 per thermodilution. (on milrinone 0.25 mcg/kg/hr) Right HD IJ HD cathter placed and plan for further fluid removal per ultrafiltration.  Underwent ultrafiltrationon03/25 (3,000 ml)and on 03/26 (2,800 ml)with good toleration.    Assessment & Plan:   Principal Problem:   Acute CHF (congestive heart failure) (HCC) Active Problems:   Acute kidney injury superimposed on chronic  kidney disease (HCC)   Essential hypertension   Elevated d-dimer   Obesity (BMI 30-39.9)   Diarrhea   Atrial flutter (HCC)   Atrial fibrillation ([place])   1. Acute on chronic systolic heart failure/ biventricular failure/ acute core pulmonale/ precapillary pulmonary hypertension, class 2.  LV EF 10 to 15%, global hypokinesis, SV 41 RV systolic severely reduced, RVSP 49,1 mmHg.  Bi-atrial enlargement, severe TR.  Right heart catheterization03/24: PA mean 33 PCW 22 PVR 1.8 Cardiac output 6.0 (Fick) 6,9 (thermodilution) on milrinone 0,25 mcg/kg/hr.  SP ultrafiltration for to consecutive days with total of 5,800 ml of fluid removed with good toleration.  His urine output is 500 cc, had    Currently onfurosemide drip 30 mg per hr, with intermittent dosing of metolazone.  Continue rate control with amiodarone.    2. Atrial flutter with RVR/ prolonged QTc. Sp unsuccessful cardioversion 03/18. Telemetry personally reviewed, patient is now on sinus rhythm with PAC, HR in the 100.  Continue with amiodarone infusion and anticoagulation with heparin.     3. 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 Sp ultrafiltration 5,800 ml.  Low urine output today to 325 ml.   Renal function this am with K at 3,7, bicarbonate at 23, NA 130, serum cr 4,8 and BUN at 45.  Plan for tunneled HD catheter in am, he may need long term renal replacement therapy.  Continue with furosemide drip at 30 mg per hr.   4. T2DM with dyslipidemiaThis am fasting glucose is 95, plan to continue with basal insulin 10 units glargine and insulin sliding scale.  Continue with atorvastatin   5. Obesity class 3. Calculated BMI is 40,0  6. HTN.Continue diuresis with ultrafiltration support.    Patient continue to be at high risk for  worsening renal and heart failure.   Status is: Inpatient  Remains inpatient appropriate because:IV treatments  appropriate due to intensity of illness or inability to take PO   Dispo: The patient is from: Home              Anticipated d/c is to: Home              Patient currently is not medically stable to d/c.   Difficult to place patient No   DVT prophylaxis: Heparin drip   Code Status:   full  Family Communication:  No family at the bedside      Consultants:   Nephrology   Cardiology   Procedures:   Right IJ catheter HD  Cardioversion      Subjective: Patient continue to improve in terms of dyspnea and lower extremity edema, no nausea or vomiting, no chest pain or palpitations.   Objective: Vitals:   10/21/20 0200 10/21/20 0300 10/21/20 0400 10/21/20 0518  BP:    101/64  Pulse:    73  Resp: '20 16 18 '$ (!) 21  Temp:    98.5 F (36.9 C)  TempSrc:    Oral  SpO2:   95% 95%  Weight:    107.5 kg  Height:        Intake/Output Summary (Last 24 hours) at 10/21/2020 1142 Last data filed at 10/21/2020 [id number] Gross per 24 hour  Intake 1425.03 ml  Output 3500 ml  Net -2074.97 ml   Filed Weights   10/19/20 0339 10/20/20 0400 10/21/20 0518  Weight: 108.4 kg 109.9 kg 107.5 kg    Examination:   General: Not in pain or dyspnea, deconditioned  Neurology: Awake and alert, non focal  E ENT: mild pallor, no icterus, oral mucosa moist Cardiovascular: No JVD. S1-S2 present, rhythmic, no gallops, rubs, or murmurs. ++ bilateral lower extremity edema, unna boots in place. Pulmonary: positive breath sounds bilaterally, with no wheezing, or rhonchi, bibasilar rales bilaterally. Gastrointestinal. Abdomen soft and non tender Skin. No rashes Musculoskeletal: no joint deformities     Data Reviewed: I have personally reviewed following labs and imaging studies  CBC: Recent Labs  Lab 10/17/20 0456 10/18/20 0500 10/19/20 0500 10/20/20 0309 10/21/20 0419  WBC 5.2 4.8 5.5 5.1 5.1  HGB 9.9* 9.5* 9.8* 9.9* 9.7*  HCT 29.6* 27.7* 29.1* 29.0* 27.7*  MCV 82.9 82.7 83.6 83.1 82.4  PLT  150 126* 134* 133* [id number]*   Basic Metabolic Panel: Recent Labs  Lab 10/17/20 0456 10/18/20 0500 10/19/20 0500 10/20/20 0309 10/21/20 0419  NA 127* 130* 132* 131* 130*  K 3.5 3.1* 3.5 3.8 3.7  CL 93* 94* 98 99 97*  CO2 20* '24 23 23 23  '$ GLUCOSE 318* 275* 112* 105* 95  BUN 52* 31* 38* 41* 45*  CREATININE 3.52* 2.72* 3.52* 4.29* 4.80*  CALCIUM 7.7* 7.7* 8.0* 7.9* 7.9*  MG  --   --   --   --  1.5*   GFR: Estimated Creatinine Clearance: 14.8 mL/min (A) (by C-G formula based on SCr of 4.8 mg/dL (H)). Liver Function Tests: No results for input(s): AST, ALT, ALKPHOS, BILITOT, PROT, ALBUMIN in the last 168 hours. No results for input(s): LIPASE, AMYLASE in the last 168 hours. No results for input(s): AMMONIA in the last 168 hours. Coagulation Profile: No results for input(s): INR, PROTIME in the last 168 hours. Cardiac Enzymes: No results for input(s): CKTOTAL, CKMB, CKMBINDEX, TROPONINI in the last 168 hours. BNP (last 3 results) No results for  input(s): PROBNP in the last 8760 hours. HbA1C: No results for input(s): HGBA1C in the last 72 hours. CBG: Recent Labs  Lab 10/20/20 1843 10/20/20 2014 10/21/20 0027 10/21/20 0520 10/21/20 0837  GLUCAP 133* 108* 108* 101* 141*   Lipid Profile: No results for input(s): CHOL, HDL, LDLCALC, TRIG, CHOLHDL, LDLDIRECT in the last 72 hours. Thyroid Function Tests: No results for input(s): TSH, T4TOTAL, FREET4, T3FREE, THYROIDAB in the last 72 hours. Anemia Panel: No results for input(s): VITAMINB12, FOLATE, FERRITIN, TIBC, IRON, RETICCTPCT in the last 72 hours.    Radiology Studies: I have reviewed all of the imaging during this hospital visit personally     Scheduled Meds: . atorvastatin  20 mg Oral Daily  . Chlorhexidine Gluconate Cloth  6 each Topical Q0600  . insulin aspart  0-6 Units Subcutaneous Q4H  . insulin glargine  10 Units Subcutaneous Daily  . pantoprazole  40 mg Oral Daily  . sodium chloride flush  3 mL Intravenous  Q12H  . sodium chloride flush  3 mL Intravenous Q12H   Continuous Infusions: . sodium chloride    . amiodarone 30 mg/hr (10/21/20 0736)  . furosemide (LASIX) 200 mg in dextrose 5% 100 mL ('2mg'$ /mL) infusion 30 mg/hr (10/21/20 0713)  . heparin 1,400 Units/hr (10/21/20 0713)     LOS: 14 days        Jennavie Martinek Gerome Apley, MD

## 2020-10-21 NOTE — Progress Notes (Signed)
[facility] Progress Note    Assessment/ Plan:   Assessment/Recommendations: Kyle Hicks is a/an 80 y.o. 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 Fib/A. Flutter, CKD Stage 3a, T2DM, HTN, obesity.  1. Non-Oliguric AKI: Likely a combination of hypoperfusion secondary to tachycardia and vascular congestion secondary to cardiorenal syndrome in setting of new EF 10-15%. -s/p temp HD line 3/24, had UF 3/25 and 3/26 for diuretic refractory volume overload -Will cont lasix and metolazone today given no plans for UF; wish to avoid recurrence of volume overload.  Will d/c in AM  - Will plan for ongoing RRT while monitoring for renal recovery -Request TDC -CLIP AKI - baseline wasn't terrible and if his heart recovers function perhaps we will see improved renal funciton -Continue to monitor for improvement in renal function and avoid further insults by avoiding nephrotoxins, avoiding hypotension. Cont to hold RAAS inhibition fo rnow.  -Monitor Daily I/Os, Daily weight  -With advanced age and significant comorbids dialysis wouldn't be easy but his pre hospital functional status is good - independent living, driving.  Plan is to eval through the week and if has ongoing RRT needs will discuss with him in more detail what that would look like so he can make an informed decision if it's needed.   A particular challenge would be if milrinone is needed outpt.   2. 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  UACR initially normal in 2018; markedly elevated in 2021 at 612. Additional differential includes obesity related GN.  Baseline creatinine was previously 1.2 - 1.6.   3. Acute Biventricular Heart Failure: Previous hx of HFpEF with progression to HFrEF with EF of 10-15%; suspected to be in the setting of A. Fib with RVR.  -HF  following -- off milrinone today and cardiology hopeful with A fib therapy his EF will rebound in the coming weeks/months.  -volume management per above  4. Hyponatremia (hypervolemic) -improved with UF, currently mildly hyponatremic; cont fluid restriction/na restriction, UF  5. A. Fib with RVR: Failed DCCV on 3/18.  Currently on Amiodarone. Per cardiology.   6. Type 2 Diabetes Mellitus: -per primary  7. Hx of HTN: Previously on HCTZ, Nifedipine and Ramipril. Currently all three are being held. BP has been soft during this admission. 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 At that time, patient refused further evaluation. LFTs WNL on admission.  Defer to primary   [name] [facility] Pager [phone]   Subjective:   UOP yesterday 536m and UF 3L.  CVP down to 8 this AM.  He feels fine, tolerated treatment without issue.  No new complaints.    Objective:   BP 101/64 (BP Location: Left Arm)   Pulse 73   Temp 98.5 F (36.9 C) (Oral)   Resp (!) 21   Ht '5\' 6"'$  (1.676 m)   Wt 107.5 kg   SpO2 95%   BMI 38.24 kg/m   Intake/Output Summary (Last 24 hours) at 10/21/2020 0904 Last data filed at 10/21/2020 [id number] per 24 hour  Intake 1375.03 ml  Output 3500 ml  Net -2124.97 ml   Weight change: -2.443 kg  Physical Exam: GBJ:[id number] gentleman, sitting up in bed CVS: irregularly irregular. No murmurs Resp:No wheezing or rales. No respiratory distress.  Abd: obese, soft, nt  Ext: trace pitting edema of 4 extr Neuro: awake, interactive, speech clear and coherent Access:  RIJ trialysis c/d/i temporary  Imaging: No results found. Labs: BMET Recent Labs  Lab 10/15/20 1300 10/16/20 0500 10/16/20 1318 10/17/20 0456 10/18/20 0500 10/19/20 0500 10/20/20 0309 10/21/20 0419  NA 134* 133* 133*  136 127* 130* 132* 131* 130*  K 3.8 3.6 3.7  3.4* 3.5 3.1* 3.5 3.8 3.7   CL 101 100  --  93* 94* 98 99 97*  CO2 21* 23  --  20* '24 23 23 23  '$ GLUCOSE 152* 134*  --  318* 275* 112* 105* 95  BUN 54* 54*  --  52* 31* 38* 41* 45*  CREATININE 3.35* 3.48*  --  3.52* 2.72* 3.52* 4.29* 4.80*  CALCIUM 8.2* 8.1*  --  7.7* 7.7* 8.0* 7.9* 7.9*   CBC Recent Labs  Lab 10/18/20 0500 10/19/20 0500 10/20/20 0309 10/21/20 0419  WBC 4.8 5.5 5.1 5.1  HGB 9.5* 9.8* 9.9* 9.7*  HCT 27.7* 29.1* 29.0* 27.7*  MCV 82.7 83.6 83.1 82.4  PLT 126* 134* 133* 136*    Medications:    . atorvastatin  20 mg Oral Daily  . Chlorhexidine Gluconate Cloth  6 each Topical Q0600  . insulin aspart  0-6 Units Subcutaneous Q4H  . insulin glargine  10 Units Subcutaneous Daily  . pantoprazole  40 mg Oral Daily  . sodium chloride flush  3 mL Intravenous Q12H  . sodium chloride flush  3 mL Intravenous Q12H   10/21/2020, 9:04 AM

## 2020-10-21 NOTE — Progress Notes (Signed)
Patient ID: TERIK DACANAY, male   DOB: 01/28/1941, 80 y.o.   MRN: [id number]     Advanced Heart Failure Rounding Note  PCP-Cardiologist: No primary care provider on file.    Patient Profile   80 y/o male with obesity and HTN. Admitted with acute HF in setting of AF with RVR of unknown duration.   Echo LVEF 10-15%. RV severely reduced. PICC placed with initial co-ox 40% suggesting markedly low output. CVP up  Subjective:    TEE 3/18 w/ severe Biv dysfunction, Mod-severe MR, severe TR. No LAA thrombus. DCCV unsuccessful  3/24 Diuresing with lasix drip and poor response.  RHC with elevated filling pressures R>L HF , CO 6/CI 2.7. Creatinine 3.5  Temp HD cath placed.  3/25 started removing UF   3L UF removed yesterday, remains oliguric. CVP back down to 8.  Remains out of afib in an atrial rhythm mostly appears sinus.  On amio gtt 75.   coox 67% with milrinone taper.     Objective:   Weight Range: 107.5 kg Body mass index is 38.24 kg/m.   Vital Signs:   Temp:  [97.3 F (36.3 C)-98.6 F (37 C)] 98.5 F (36.9 C) (03/29 0518) Pulse Rate:  [73-84] 73 (03/29 0518) Resp:  [16-26] 21 (03/29 0518) BP: (98-127)/(61-106) 101/64 (03/29 0518) SpO2:  [95 %-100 %] 95 % (03/29 0518) Weight:  [107.5 kg] 107.5 kg (03/29 0518) Last BM Date: 10/20/20  Weight change: Filed Weights   10/19/20 0339 10/20/20 0400 10/21/20 0518  Weight: 108.4 kg 109.9 kg 107.5 kg    Intake/Output:   Intake/Output Summary (Last 24 hours) at 10/21/2020 0750 Last data filed at 10/21/2020 [id number] Gross per 24 hour  Intake 1495.03 ml  Output 3500 ml  Net -2004.97 ml      Physical Exam  CVP 8 General: NAD Cardiac:  normal rate and rhythm, clear s1 and s2, no murmurs, rubs or gallops, upper and lower extremity edema, legs in unna boots Pulmonary: anterior breath sounds clear, not in distress Abdominal: non distended abdomen, soft and nontender Psych: Alert, conversant, in good spirits    Telemetry    Sinus tach vs ectopic atrial rhythm 90's-low 100's rate, WAP episode last night  Labs    CBC Recent Labs    10/20/20 0309 10/21/20 0419  WBC 5.1 5.1  HGB 9.9* 9.7*  HCT 29.0* 27.7*  MCV 83.1 82.4  PLT 133* [id number]*   Basic Metabolic Panel Recent Labs    10/20/20 0309 10/21/20 0419  NA 131* 130*  K 3.8 3.7  CL 99 97*  CO2 23 23  GLUCOSE 105* 95  BUN 41* 45*  CREATININE 4.29* 4.80*  CALCIUM 7.9* 7.9*  MG  --  1.5*   Liver Function Tests No results for input(s): AST, ALT, ALKPHOS, BILITOT, PROT, ALBUMIN in the last 72 hours. No results for input(s): LIPASE, AMYLASE in the last 72 hours. Cardiac Enzymes No results for input(s): CKTOTAL, CKMB, CKMBINDEX, TROPONINI in the last 72 hours.  BNP: BNP (last 3 results) Recent Labs    10/07/20 0229  BNP 4,299.0*    ProBNP (last 3 results) No results for input(s): PROBNP in the last 8760 hours.   D-Dimer No results for input(s): DDIMER in the last 72 hours. Hemoglobin A1C No results for input(s): HGBA1C in the last 72 hours. Fasting Lipid Panel No results for input(s): CHOL, HDL, LDLCALC, TRIG, CHOLHDL, LDLDIRECT in the last 72 hours. Thyroid Function Tests No results for input(s): TSH, T4TOTAL, T3FREE,  THYROIDAB in the last 72 hours.  Invalid input(s): FREET3  Other results:   Imaging    No results found.   Medications:     Scheduled Medications: . atorvastatin  20 mg Oral Daily  . Chlorhexidine Gluconate Cloth  6 each Topical Q0600  . insulin aspart  0-6 Units Subcutaneous Q4H  . insulin glargine  10 Units Subcutaneous Daily  . metolazone  10 mg Oral Once  . pantoprazole  40 mg Oral Daily  . sodium chloride flush  3 mL Intravenous Q12H  . sodium chloride flush  3 mL Intravenous Q12H    Infusions: . sodium chloride    . amiodarone 30 mg/hr (10/21/20 0736)  . furosemide (LASIX) 200 mg in dextrose 5% 100 mL ('2mg'$ /mL) infusion 30 mg/hr (10/21/20 0713)  . heparin 1,400 Units/hr (10/21/20 0713)  .  magnesium sulfate bolus IVPB    . milrinone 0.125 mcg/kg/min (10/21/20 0713)    PRN Medications: sodium chloride, acetaminophen, acetaminophen, albuterol, dextrose, hydrocortisone cream, ondansetron (ZOFRAN) IV, ondansetron (ZOFRAN) IV, sodium chloride flush, sodium chloride flush  Assessment/Plan   1. A Flutter RVR - Started on amiodarone drip. - Failed attempt at [place] 3/18 - HR 90s-100s today, NSR with PAC's vs ectopic atrial rhythms.   amio decreased to '30mg'$ /hr - TSH normal - Continue IV heparin  2. 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  - Echo this admit w/ severe biventricular HF with EF < 15%.  BNP > 4000 . Suspect tachy-mediated. HS Trop - no trend. No chest pain. No LHC for now. Could consider ischemic work up later if renal function improves. Susepct he may need RHC  - PICC placed initial Co-ox 40%. Started Milrinone 0.25 - Had [place] 3/24 with elevated filling pressures R>L and preserved cardiac output.  - CO-OX 67%. D/c milrinone  - 3L UF removed, CVP 8 today    - No bb w/ low output  - No arb/dig/spiro with AKI  - c/w unna boots.   3. AKI on CKD Stage IIIa - Creatinine on admit 2.8.  Most recent creatinine was 1.55 back in 03/2020. - started on milrinone for low output - Has temporary HD cath and got UF runs x 3 days.  kidneys not recovering despite improved CO with milrinone and NSR - Remains oliguric, nephrology discussing with patient about dialysis initiation  4. DMII - Hgb A1C 5.9  - On SSI  5. Lower Extremity Edema - Dopplers negative for DVT - 2/2 CHF Continue diuretics + unna boots   6. 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  -Continue PPI  7. HTN  -Soft.   8. Hypervolemic Hyponatremia - Na 131 Restrict free water.   Length of Stay: [place], MD  10/21/2020, 7:50 AM  Advanced Heart Failure Team Pager [phone] (M-F; 7a - 5p)    Patient seen and examined with the above-signed Advanced  Practice Provider and/or Housestaff. I personally reviewed laboratory data, imaging studies and relevant notes. I independently examined the patient and formulated the important aspects of the plan. I have edited the note to reflect any of my changes or salient points. I have personally discussed the plan with the patient and/or family.  Had planned to stop milrinone yesterday but still on 0.125. Co-ox stable at 66%. Underwent UF yesterday. Breathing better. Tolerated well. Remains in NSR on IV amio.  Anuric. CVP 8  General:  Lying in bed No resp difficulty HEENT: normal Neck: supple.RIJ  HD cath Carotids 2+ bilat; no bruits. No lymphadenopathy or thryomegaly appreciated. Cor: PMI nondisplaced. Regular rate & rhythm. 2/6 TR Lungs: clear Abdomen: obese  soft, nontender, nondistended. No hepatosplenomegaly. No bruits or masses. Good bowel sounds. Extremities: no cyanosis, clubbing, rash, 1-2+ edema with UNNA boots Neuro: alert & orientedx3, cranial nerves grossly intact. moves all 4 extremities w/o difficulty. Affect pleasant  He is improving with UF. Weight down about 28 pounds but still some to go. Doubt he will need milrinone going forward. Will stop today and follow co-ox. He is now back in NSR. Continue IBV amio one more day. Continue heparin until we decide what to do with HD access then switch to DOAC. I am hopeful EF will improve gradually with restoration of NSR.   D/w Renal regarding possibility of offering long-term HD.   Glori Bickers, MD  9:34 AM

## 2020-10-21 NOTE — Progress Notes (Signed)
Physical Therapy Treatment Patient Details Name: Kyle Hicks MRN: [id number] DOB: 24-Nov-1940 Today's Date: 10/21/2020    History of Present Illness Pt is a 80 y.o. male admitted 10/07/20 with c/o diarrhea, weakness and fall at home. CXR with enlarged cardiac sillhouette, small R-side pleural effusion. Pt also with tachycardia, HF, AKI. Pt with aflutter RVR 3/17. S/p TEE cardioversion 3/18 showing severe biventricular dysfunction, severe TR, mod-severe MR; DCCV unsuccessful. Course complicated by non-oliguric AKI, likely combo of hypoperfusion secondary to tachycardia and vascular congestion; for renal ultrasound 3/22. Plan RHC and placement of HD cath 3/24. PMH includes HTN, CHF, DM2, CKD IIIa, PUD, GIB.    PT Comments    Patient progressing slowly towards PT goals. Improved ambulation distance with Min guard-Min A and use of RW for support. Noted to have increased forward momentum with shuffling like gait pattern when fatigued and walking back towards room, needing cues for upright and RW proximity. Noted to have 2-3/4 DOE with Sp02 dropping to 87% on RA with activity, rebounds quickly with a rest break. HR stable today, up in 120s with activity. Continues to require assist to manage LEs in and out of bed. Encouraged increasing activity while in the hospital. POC updated. Plan for placement of HD catheter tomorrow, 10/21/20. Will follow.    Follow Up Recommendations  Home health PT;Supervision for mobility/OOB     Equipment Recommendations  None recommended by PT    Recommendations for Other Services       Precautions / Restrictions Precautions Precautions: Fall;Other (comment) Precaution Comments: watch 02, watch HR (Dr. Mahalia Longest says HR in 140s-150s ok). Restrictions Weight Bearing Restrictions: No    Mobility  Bed Mobility Overal bed mobility: Needs Assistance Bed Mobility: Supine to Sit;Sit to Supine     Supine to sit: Min assist;HOB elevated Sit to supine: Mod assist;HOB  elevated   General bed mobility comments: Assist with bringing LLE to EOB with increased time and cues to reach for rail; assist to bring LEs into bed to return to supine and to reposition self. Limited use of LUE to reach across body to grab rail without RUE assisting.    Transfers Overall transfer level: Needs assistance Equipment used: Rolling walker (2 wheeled) Transfers: Sit to/from Stand Sit to Stand: Min assist         General transfer comment: Assist to power to standing with use of momentum and cues for hand placement. Declined transfer to chair.  Ambulation/Gait Ambulation/Gait assistance: Min guard Gait Distance (Feet): 120 Feet Assistive device: Rolling walker (2 wheeled) Gait Pattern/deviations: Step-through pattern;Trunk flexed;Wide base of support;Shuffle Gait velocity: varies   General Gait Details: Slow mildly unsteady gait with cues for RW proximity esp when fatigued and assist with turning as pt walking too far outside RW. 2-3/4 DOE. Forward momentum increased on way back to room with shuffling gait pattern. SP02 dropped to 87% on RA but rebounds quickly with rest break. HR stable in 120s with ambulation.   Stairs             Wheelchair Mobility    Modified Rankin (Stroke Patients Only)       Balance Overall balance assessment: Needs assistance Sitting-balance support: No upper extremity supported;Feet supported Sitting balance-[name] Scale: Fair Sitting balance - Comments: leaning on right forearm as position of comfort Postural control: Right lateral lean Standing balance support: During functional activity Standing balance-[name] Scale: Poor Standing balance comment: reliant on BUE support  Cognition Arousal/Alertness: Awake/alert Behavior During Therapy: Flat affect Overall Cognitive Status: No family/caregiver present to determine baseline cognitive functioning                                  General Comments: Appears WFL for basic mobility tasks. Needs repetition at times due to [facility]      Exercises      General Comments General comments (skin integrity, edema, etc.): Sp02 ranged from 87-96% on RA.      Pertinent Vitals/Pain Pain Assessment: No/denies pain    Home Living                      Prior Function            PT Goals (current goals can now be found in the care plan section) Acute Rehab PT Goals Patient Stated Goal: to get stronger and get home PT Goal Formulation: With patient Time For Goal Achievement: 11/04/20 Potential to Achieve Goals: Good Progress towards PT goals: Progressing toward goals    Frequency    Min 3X/week      PT Plan Current plan remains appropriate    Co-evaluation              AM-PAC PT "6 Clicks" Mobility   Outcome Measure  Help needed turning from your back to your side while in a flat bed without using bedrails?: A Little Help needed moving from lying on your back to sitting on the side of a flat bed without using bedrails?: A Lot Help needed moving to and from a bed to a chair (including a wheelchair)?: A Little Help needed standing up from a chair using your arms (e.g., wheelchair or bedside chair)?: A Little Help needed to walk in hospital room?: A Little Help needed climbing 3-5 steps with a railing? : A Lot 6 Click Score: 16    End of Session Equipment Utilized During Treatment: Gait belt Activity Tolerance: Patient tolerated treatment well;Other (comment) (dyspnea) Patient left: in bed;with call bell/phone within reach;with bed alarm set Nurse Communication: Mobility status PT Visit Diagnosis: Muscle weakness (generalized) (M62.81);Difficulty in walking, not elsewhere classified (R26.2)     Time: [id number] PT Time Calculation (min) (ACUTE ONLY): 29 min  Charges:  $Gait Training: 8-22 mins $Therapeutic Activity: 8-22 mins                     Marisa Severin, PT, DPT Acute Rehabilitation  Services Pager [phone] Office [place] 10/21/2020, 1:11 PM

## 2020-10-21 NOTE — Progress Notes (Signed)
[place] for Xarelto>Heparin Indication: atrial fibrillation  No Known Allergies  Patient Measurements: Height: '5\' 6"'$  (167.6 cm) Weight: 107.5 kg (236 lb 14.4 oz) IBW/kg (Calculated) : 63.8 Heparin Dosing Weight: 89.3 kg  Vital Signs: Temp: 98 F (36.7 C) (03/29 1248) Temp Source: Oral (03/29 1248) BP: 101/64 (03/29 0518) Pulse Rate: 73 (03/29 0518)  Labs: Recent Labs    10/19/20 0500 10/20/20 0309 10/21/20 0419 10/21/20 1330  HGB 9.8* 9.9* 9.7*  --   HCT 29.1* 29.0* 27.7*  --   PLT 134* 133* 136*  --   HEPARINUNFRC 0.30 0.26* 0.26* 0.31  CREATININE 3.52* 4.29* 4.80*  --     Estimated Creatinine Clearance: 14.8 mL/min (A) (by C-G formula based on SCr of 4.8 mg/dL (H)).   Assessment: 80 yo M with new Afib with RVR. No AC noted PTA. Was started on heparin then switched to Xarelto 3/16 and received 1 dose. Pharmacy asked to switch back to IV heparin. Patient now s/p unsuccessful DCCV but converted to NSR on amio gtt.  Heparin level on low end of therapeutic at 0.31, on drip rate 1400 units/hr. Rn reports some bleeding at original IJ site, but no other overt bleeding or infusion issues noted. Hgb stable 9s, pltc stable 130s. Will increase drip rate slightly to maintain therapeutic level. Plans noted for [facility] placement tomorrow with IR tomorrow, no need to turn heparin off during procedure per IR.   Goal of Therapy:  Heparin level 0.3-0.7 units/ml Monitor platelets by anticoagulation protocol: Yes   Plan:  Increase IV heparin to 1450 units/hr  Monitor daily HL, CBC, s/sx bleeding F/u switch to Eliquis post-procedure  Richardine Service, PharmD, BCPS PGY2 Cardiology Pharmacy Resident Phone: [phone] 10/21/2020  3:32 PM  Please check [email or web address] for unit-specific pharmacy phone numbers.

## 2020-10-22 DIAGNOSIS — N179 Acute kidney failure, unspecified: Secondary | ICD-10-CM | POA: Diagnosis not present

## 2020-10-22 DIAGNOSIS — I10 Essential (primary) hypertension: Secondary | ICD-10-CM | POA: Diagnosis not present

## 2020-10-22 DIAGNOSIS — I5041 Acute combined systolic (congestive) and diastolic (congestive) heart failure: Secondary | ICD-10-CM | POA: Diagnosis not present

## 2020-10-22 DIAGNOSIS — I4819 Other persistent atrial fibrillation: Secondary | ICD-10-CM | POA: Diagnosis not present

## 2020-10-22 LAB — BASIC METABOLIC PANEL
Anion gap: 12 (ref 5–15)
BUN: 50 mg/dL — ABNORMAL HIGH (ref 8–23)
CO2: 22 mmol/L (ref 22–32)
Calcium: 8.1 mg/dL — ABNORMAL LOW (ref 8.9–10.3)
Chloride: 95 mmol/L — ABNORMAL LOW (ref 98–111)
Creatinine, Ser: 5.45 mg/dL — ABNORMAL HIGH (ref 0.61–1.24)
GFR, Estimated: 10 mL/min — ABNORMAL LOW (ref >60)
Glucose, Bld: 96 mg/dL (ref 70–99)
Potassium: 3.5 mmol/L (ref 3.5–5.1)
Sodium: 129 mmol/L — ABNORMAL LOW (ref 135–145)

## 2020-10-22 LAB — GLUCOSE, CAPILLARY
Glucose-Capillary: 103 mg/dL — ABNORMAL HIGH (ref 70–99)
Glucose-Capillary: 92 mg/dL (ref 70–99)
Glucose-Capillary: 169 mg/dL — ABNORMAL HIGH (ref 70–99)
Glucose-Capillary: 119 mg/dL — ABNORMAL HIGH (ref 70–99)

## 2020-10-22 LAB — COOXEMETRY PANEL
Carboxyhemoglobin: 1.2 % (ref 0.5–1.5)
Methemoglobin: 0.9 % (ref 0.0–1.5)
O2 Saturation: 59.9 %
Total hemoglobin: 10 g/dL — ABNORMAL LOW (ref 12.0–16.0)

## 2020-10-22 LAB — MAGNESIUM: Magnesium: 1.7 mg/dL (ref 1.7–2.4)

## 2020-10-22 LAB — CBC
HCT: 28.8 % — ABNORMAL LOW (ref 39.0–52.0)
Hemoglobin: 9.9 g/dL — ABNORMAL LOW (ref 13.0–17.0)
MCH: 28.2 pg (ref 26.0–34.0)
MCHC: 34.4 g/dL (ref 30.0–36.0)
MCV: 82.1 fL (ref 80.0–100.0)
Platelets: 141 10*3/uL — ABNORMAL LOW (ref 150–400)
RBC: 3.51 MIL/uL — ABNORMAL LOW (ref 4.22–5.81)
RDW: 19.8 % — ABNORMAL HIGH (ref 11.5–15.5)
WBC: 5.3 10*3/uL (ref 4.0–10.5)
nRBC: 0 % (ref 0.0–0.2)

## 2020-10-22 LAB — HEPARIN LEVEL (UNFRACTIONATED): Heparin Unfractionated: 0.25 IU/mL — ABNORMAL LOW (ref 0.30–0.70)

## 2020-10-22 MED ORDER — SODIUM CHLORIDE 0.9% FLUSH
3.0000 mL | Freq: Two times a day (BID) | INTRAVENOUS | Status: DC
  Administered 2020-10-22 – 2020-10-24 (×3): 3 mL via INTRAVENOUS

## 2020-10-22 MED ORDER — HEPARIN SODIUM (PORCINE) 1000 UNIT/ML IJ SOLN
INTRAMUSCULAR | Status: AC
  Administered 2020-10-22: 2400 [IU] via INTRAVENOUS
  Filled 2020-10-22: qty 3

## 2020-10-22 MED ORDER — AMIODARONE HCL 200 MG PO TABS
200.0000 mg | ORAL_TABLET | Freq: Two times a day (BID) | ORAL | Status: DC
  Administered 2020-10-22 – 2020-10-27 (×11): 200 mg via ORAL
  Filled 2020-10-22 (×11): qty 1

## 2020-10-22 MED ORDER — MAGNESIUM SULFATE 2 GM/50ML IV SOLN
2.0000 g | Freq: Once | INTRAVENOUS | Status: AC
  Administered 2020-10-22: 2 g via INTRAVENOUS
  Filled 2020-10-22: qty 50

## 2020-10-22 MED ORDER — POTASSIUM CHLORIDE CRYS ER 20 MEQ PO TBCR
20.0000 meq | EXTENDED_RELEASE_TABLET | Freq: Once | ORAL | Status: AC
  Administered 2020-10-22: 20 meq via ORAL
  Filled 2020-10-22: qty 1

## 2020-10-22 MED ORDER — HEPARIN SODIUM (PORCINE) 1000 UNIT/ML IJ SOLN
1000.0000 [IU] | INTRAMUSCULAR | Status: DC | PRN
  Filled 2020-10-22: qty 1

## 2020-10-22 NOTE — Progress Notes (Signed)
PROGRESS NOTE    HOMMER STUCKER  [id number] DOB: [birth year] DOA: 10/07/2020 PCP: Sharilyn Sites, MD    Brief Narrative:  Mr. Siverling admitted to the hospitalwith theworking diagnosis of acute on chronic systolic heart failure decompensation, complicated with pulmonary HTN class 2, acute core pulmonale, biventricular failure..  80 year old male past medical history for hypertension, heart failure, type 2 diabetes mellitus and history of GI bleed who presented after mechanical fall. On the day of admission he got up to use the restroom, fell a week and sustained a mechanical fall. No head trauma or loss of consciousness. Reports lower extremity edema, dyspnea, wheezing or palpitations. Positive diarrhea. On his initial physical examination heart rate 129-134, respiratory rate 19-43, blood pressure 105/73-130 8/108, oxygen saturation 98% on supplemental oxygen. His lungs had no wheezing or rales, heart S1-S2, present, rhythmic, tachycardic, abdomen soft, positive lower extremity edema.  Chest radiograph with cardiomegaly, positive hilar vascular congestion, small right pleural effusion.  EKG 133 bpm, left axis deviation, Q waves lead II, lead III, aVF, prolonged QTc545, right bundle branch block,atrial flutter, no ST segment changes, negative T wave V1-V3.  Patientsuspected low output heart failure andstarted on milrinone infusion along with furosemide drip. Amiodarone drip for rate control atrial flutter.  Right heart cath with PCW 22 with PA mean 33 and cardiac output 6.0 per Fick and 6.9 per thermodilution. (on milrinone 0.25 mcg/kg/hr) Right HD IJ HD cathter placed and plan for further fluid removal per ultrafiltration.  Underwent ultrafiltrationon03/25 (3,000 ml)and on 03/26 (2,800 ml)with good toleration  Patient successfully weaned of furosemide drip and milrinone, improved heart rate with amiodarone.   Unfortunately renal function not recovering, likely will  need long term hemodialysis.    Assessment & Plan:   Principal Problem:   Acute CHF (congestive heart failure) (HCC) Active Problems:   Acute kidney injury superimposed on chronic kidney disease (HCC)   Essential hypertension   Elevated d-dimer   Obesity (BMI 30-39.9)   Diarrhea   Atrial flutter (HCC)   Atrial fibrillation ([place])    1. Acute on chronic systolic heart failure/ biventricular failure/ acute core pulmonale/ precapillary pulmonary hypertension, class 2.  LV EF 10 to 15%, global hypokinesis, SV 41 RV systolic severely reduced, RVSP 49,1 mmHg.  Bi-atrial enlargement, severe TR.  Right heart catheterization03/24: PA mean 33 PCW 22 PVR 1.8 Cardiac output 6.0 (Fick) 6,9 (thermodilution) on milrinone 0,25 mcg/kg/hr.  SPultrafiltration for to consecutive days with total of 5,800 ml of fluid removed with good toleration.   His hemodynamics are improved, now off milrinone infusion, and improved heart rate on amiodarone. Continue ultrafiltration with HD. Plan for ischemic workup with coronary angiography before discharge.   2. Atrial flutter with RVR/ prolonged QTc. Sp unsuccessful cardioversion 03/18.  Patient has converted to sinus rhythm, continue with amiodarone, now changed to po.   Anticoagulation with heparin drip for now, until HD access secured and coronary angiography.   3. 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  Sp ultrafiltration 5,800 ml.   NA is 129, K is 3,5 and serum bicarbonate at 22. Cr up to 5,45 and BUN 50. Mag 1,7.   Not recovery of renal function, will continue to follow up nephrology recommendations, for renal replacement therapy. Will need outpatient HD unit and a tunneled HD catheter.  Continue K correction with KCl.   4. T2DM with dyslipidemiaglucose control with basal insulin 10 units glargine and insulin sliding scale.  On atorvastatin   5. Obesity  class  3. Calculated BMI is 40,0  6. HTN.Blood pressure monitoring, now off milrinone,     Patient continue to be at high risk for worsening heart and renal failure   Status is: Inpatient  Remains inpatient appropriate because:Inpatient level of care appropriate due to severity of illness   Dispo: The patient is from: Home              Anticipated d/c is to: Home              Patient currently is not medically stable to d/c.   Difficult to place patient No   DVT prophylaxis: Heparin drip   Code Status:   full  Family Communication:  No family at the bedside      Consultants:   Cardiology   Nephrology   Procedures:   Right IJ HD catheter    Subjective: Patient is feeling better, no nausea or vomiting, dyspnea and lower extremity edema continue to improve,   Objective: Vitals:   10/22/20 0945 10/22/20 1000 10/22/20 1029 10/22/20 1200  BP: (!) 117/56 117/77 111/62 (!) 101/59  Pulse: 83 89 70 84  Resp: 18  16   Temp:   97.6 F (36.4 C)   TempSrc:   Oral   SpO2: 99%  98%   Weight:   106.2 kg   Height:        Intake/Output Summary (Last 24 hours) at 10/22/2020 1604 Last data filed at 10/22/2020 1400 Gross per 24 hour  Intake 804.11 ml  Output 2000 ml  Net -1195.89 ml   Filed Weights   10/22/20 0000 10/22/20 0720 10/22/20 1029  Weight: 108.4 kg 108.5 kg 106.2 kg    Examination:   General: Not in pain or dyspnea, deconditioned  Neurology: Awake and alert, non focal  E ENT: no pallor, no icterus, oral mucosa moist Cardiovascular: No JVD. S1-S2 present, rhythmic, no gallops, rubs, or murmurs. +/++ bilateral lower extremity edema. Unna boots in place,  Pulmonary: positive breath sounds bilaterally, no wheezing Gastrointestinal. Abdomen soft and non tender Skin. No rashes Musculoskeletal: no joint deformities     Data Reviewed: I have personally reviewed following labs and imaging studies  CBC: Recent Labs  Lab 10/18/20 0500 10/19/20 0500  10/20/20 0309 10/21/20 0419 10/22/20 0412  WBC 4.8 5.5 5.1 5.1 5.3  HGB 9.5* 9.8* 9.9* 9.7* 9.9*  HCT 27.7* 29.1* 29.0* 27.7* 28.8*  MCV 82.7 83.6 83.1 82.4 82.1  PLT 126* 134* 133* 136* [id number]*   Basic Metabolic Panel: Recent Labs  Lab 10/18/20 0500 10/19/20 0500 10/20/20 0309 10/21/20 0419 10/22/20 0412  NA 130* 132* 131* 130* 129*  K 3.1* 3.5 3.8 3.7 3.5  CL 94* 98 99 97* 95*  CO2 '24 23 23 23 22  '$ GLUCOSE 275* 112* 105* 95 96  BUN 31* 38* 41* 45* 50*  CREATININE 2.72* 3.52* 4.29* 4.80* 5.45*  CALCIUM 7.7* 8.0* 7.9* 7.9* 8.1*  MG  --   --   --  1.5* 1.7   GFR: Estimated Creatinine Clearance: 13 mL/min (A) (by C-G formula based on SCr of 5.45 mg/dL (H)). Liver Function Tests: No results for input(s): AST, ALT, ALKPHOS, BILITOT, PROT, ALBUMIN in the last 168 hours. No results for input(s): LIPASE, AMYLASE in the last 168 hours. No results for input(s): AMMONIA in the last 168 hours. Coagulation Profile: No results for input(s): INR, PROTIME in the last 168 hours. Cardiac Enzymes: No results for input(s): CKTOTAL, CKMB, CKMBINDEX, TROPONINI in the last 168 hours. BNP (  last 3 results) No results for input(s): PROBNP in the last 8760 hours. HbA1C: No results for input(s): HGBA1C in the last 72 hours. CBG: Recent Labs  Lab 10/21/20 1629 10/21/20 2006 10/21/20 2333 10/22/20 0408 10/22/20 1530  GLUCAP 134* 152* 115* 103* 92   Lipid Profile: No results for input(s): CHOL, HDL, LDLCALC, TRIG, CHOLHDL, LDLDIRECT in the last 72 hours. Thyroid Function Tests: No results for input(s): TSH, T4TOTAL, FREET4, T3FREE, THYROIDAB in the last 72 hours. Anemia Panel: No results for input(s): VITAMINB12, FOLATE, FERRITIN, TIBC, IRON, RETICCTPCT in the last 72 hours.    Radiology Studies: I have reviewed all of the imaging during this hospital visit personally     Scheduled Meds: . amiodarone  200 mg Oral BID  . atorvastatin  20 mg Oral Daily  . Chlorhexidine Gluconate  Cloth  6 each Topical Q0600  . insulin aspart  0-6 Units Subcutaneous Q4H  . insulin glargine  10 Units Subcutaneous Daily  . pantoprazole  40 mg Oral Daily  . sodium chloride flush  3 mL Intravenous Q12H  . sodium chloride flush  3 mL Intravenous Q12H  . sodium chloride flush  3 mL Intravenous Q12H   Continuous Infusions: . sodium chloride    . heparin 1,450 Units/hr (10/22/20 0213)     LOS: 15 days        Cheyan Frees Gerome Apley, MD

## 2020-10-22 NOTE — Progress Notes (Signed)
Occupational Therapy Treatment Patient Details Name: Kyle Hicks MRN: [id number] DOB: 1940-09-05 Today's Date: 10/22/2020    History of present illness Pt is a 80 y.o. male admitted 10/07/20 with c/o diarrhea, weakness and fall at home. CXR with enlarged cardiac sillhouette, small R-side pleural effusion. Pt also with tachycardia, HF, AKI. Pt with aflutter RVR 3/17. S/p TEE cardioversion 3/18 showing severe biventricular dysfunction, severe TR, mod-severe MR; DCCV unsuccessful. Course complicated by non-oliguric AKI, likely combo of hypoperfusion secondary to tachycardia and vascular congestion; for renal ultrasound 3/22. Plan RHC and placement of HD cath 3/24. PMH includes HTN, CHF, DM2, CKD IIIa, PUD, GIB.   OT comments  Pt making steady progress towards OT goals this session. Pt continues to present with decreased activity tolerance, generalized deconditioning and BLE weakness, however pt able to complete functional mobility greater than a household distance with RW and MIN A +1, most assist needed for transitional turns to assist with managing RW. Pt currently requires total A for LB ADLS and MIN A for UB ADLs. Pt would continue to benefit from skilled occupational therapy while admitted and after d/c to address the below listed limitations in order to improve overall functional mobility and facilitate independence with BADL participation. DC plan remains appropriate, will follow acutely per POC.     Follow Up Recommendations  Home health OT;Supervision/Assistance - 24 hour;Other (comment) (initially)    Equipment Recommendations  3 in 1 bedside commode;Other (comment) (already in room)    Recommendations for Other Services      Precautions / Restrictions Precautions Precautions: Fall;Other (comment) Precaution Comments: watch 02, watch HR (Dr. Mahalia Longest says HR in 140s-150s ok). Restrictions Weight Bearing Restrictions: No       Mobility Bed Mobility Overal bed mobility: Needs  Assistance       Supine to sit: Mod assist;HOB elevated Sit to supine: Mod assist;HOB elevated   General bed mobility comments: pt requires MOD A for all aspects of bed mobility, most assist needed to elevate trunk into sitting and assis to elevated BLEs back to bed    Transfers Overall transfer level: Needs assistance Equipment used: Rolling walker (2 wheeled) Transfers: Sit to/from Stand Sit to Stand: Mod assist         General transfer comment: pt completed x2 sit<>stands from EOB with pt needing MOD A to power into standing and heavy use of momentum, cues to shift weight anteriorly in standing as pt with tendency to lean posteriorly    Balance Overall balance assessment: Needs assistance Sitting-balance support: No upper extremity supported;Feet supported Sitting balance-[name] Scale: Fair Sitting balance - Comments: supervision to sit EOB   Standing balance support: During functional activity;Bilateral upper extremity supported Standing balance-[name] Scale: Poor Standing balance comment: reliant on BUE support                           ADL either performed or assessed with clinical judgement   ADL Overall ADL's : Needs assistance/impaired                 Upper Body Dressing : Minimal assistance;Sitting Upper Body Dressing Details (indicate cue type and reason): to don posterior gown Lower Body Dressing: Total assistance;Sitting/lateral leans Lower Body Dressing Details (indicate cue type and reason): to don socks Toilet Transfer: Minimal assistance;Moderate assistance;RW;Ambulation Toilet Transfer Details (indicate cue type and reason): simulated via functional mobility with Rw, MOD A to rise from EOB and MIN A for ambulation with Rw  Functional mobility during ADLs: Minimal assistance;Moderate assistance;Rolling walker General ADL Comments: pt continues to present with decreased activity tolerance, generalized deconditioning and BLE  weakness     Vision       Perception     Praxis      Cognition Arousal/Alertness: Awake/alert Behavior During Therapy: Flat affect Overall Cognitive Status: Within Functional Limits for tasks assessed                                 General Comments: pt very flat but feel this is his baseline as pts wife and son in law present during session, sometimes requires increased time to follow commands but likely d/t [facility]        Exercises     Shoulder Instructions       General Comments pt in Afib during mobility but HR WFL, pt also on RA during session with SpO2 WFL    Pertinent Vitals/ Pain       Pain Assessment: No/denies pain  Home Living                                          Prior Functioning/Environment              Frequency  Min 2X/week        Progress Toward Goals  OT Goals(current goals can now be found in the care plan section)  Progress towards OT goals: Progressing toward goals  Acute Rehab OT Goals Patient Stated Goal: none stated Time For Goal Achievement: 10/25/20 Potential to Achieve Goals: Good  Plan Discharge plan remains appropriate;Frequency remains appropriate    Co-evaluation                 AM-PAC OT "6 Clicks" Daily Activity     Outcome Measure   Help from another person eating meals?: None Help from another person taking care of personal grooming?: A Little Help from another person toileting, which includes using toliet, bedpan, or urinal?: A Lot Help from another person bathing (including washing, rinsing, drying)?: A Lot Help from another person to put on and taking off regular upper body clothing?: A Little Help from another person to put on and taking off regular lower body clothing?: Total 6 Click Score: 15    End of Session Equipment Utilized During Treatment: Gait belt;Rolling walker  OT Visit Diagnosis: Unsteadiness on feet (R26.81);Other abnormalities of gait and mobility  (R26.89);Muscle weakness (generalized) (M62.81);Other symptoms and signs involving cognitive function   Activity Tolerance Patient tolerated treatment well   Patient Left in bed;with call bell/phone within reach;with bed alarm set;with family/visitor present   Nurse Communication Mobility status        Time: 1353-1416 OT Time Calculation (min): 23 min  Charges: OT General Charges $OT Visit: 1 Visit OT Treatments $Therapeutic Activity: 23-37 mins  Harley Alto., COTA/L Acute Rehabilitation Services [phone]    Precious Haws 10/22/2020, 4:26 PM

## 2020-10-22 NOTE — Progress Notes (Addendum)
Patient ID: Kyle Hicks, male   DOB: 1941/02/08, 80 y.o.   MRN: [id number]     Advanced Heart Failure Rounding Note  PCP-Cardiologist: No primary care provider on file.    Patient Profile   80 y/o male with obesity and HTN. Admitted with acute HF in setting of AF with RVR of unknown duration.   Echo LVEF 10-15%. RV severely reduced. PICC placed with initial co-ox 40% suggesting markedly low output. CVP up  Subjective:    TEE 3/18 w/ severe Biv dysfunction, Mod-severe MR, severe TR. No LAA thrombus. DCCV unsuccessful  3/24 Diuresing with lasix drip and poor response.  RHC with elevated filling pressures R>L HF , CO 6/CI 2.7. Creatinine 3.5  Temp HD cath placed.  3/25 started removing UF   No UF removed yesterday, no urine output.  CVP 9.  Remains in nsr on amio gtt 30.   coox 60% with milrinone off.   Seen in HD he has no complaints, denies dyspnea.     Objective:   Weight Range: 108.5 kg Body mass index is 38.61 kg/m.   Vital Signs:   Temp:  [97.3 F (36.3 C)-98.6 F (37 C)] 98.5 F (36.9 C) (03/30 0720) Pulse Rate:  [72-84] 75 (03/30 0728) Resp:  [20-22] 22 (03/30 0728) BP: (93-109)/(58-67) 109/67 (03/30 0728) SpO2:  [97 %-100 %] 98 % (03/30 0720) Weight:  [108.4 kg-108.5 kg] 108.5 kg (03/30 0720) Last BM Date: 10/21/20  Weight change: Filed Weights   10/21/20 0518 10/22/20 0000 10/22/20 0720  Weight: 107.5 kg 108.4 kg 108.5 kg    Intake/Output:   Intake/Output Summary (Last 24 hours) at 10/22/2020 0740 Last data filed at 10/22/2020 0339 Gross per 24 hour  Intake 1155.1 ml  Output 0 ml  Net 1155.1 ml      Physical Exam  CVP 9 General: NAD Cardiac:  normal rate and rhythm, clear s1 and s2, no murmurs, rubs or gallops, upper and lower extremity edema, legs in unna boots Pulmonary: anterior breath sounds clear, not in distress Abdominal: non distended abdomen, soft and nontender Psych: Alert, conversant, in good spirits    Telemetry   Sinus   70's-80's  Labs    CBC Recent Labs    10/21/20 0419 10/22/20 0412  WBC 5.1 5.3  HGB 9.7* 9.9*  HCT 27.7* 28.8*  MCV 82.4 82.1  PLT 136* [id number]*   Basic Metabolic Panel Recent Labs    10/21/20 0419 10/22/20 0412  NA 130* 129*  K 3.7 3.5  CL 97* 95*  CO2 23 22  GLUCOSE 95 96  BUN 45* 50*  CREATININE 4.80* 5.45*  CALCIUM 7.9* 8.1*  MG 1.5* 1.7   Liver Function Tests No results for input(s): AST, ALT, ALKPHOS, BILITOT, PROT, ALBUMIN in the last 72 hours. No results for input(s): LIPASE, AMYLASE in the last 72 hours. Cardiac Enzymes No results for input(s): CKTOTAL, CKMB, CKMBINDEX, TROPONINI in the last 72 hours.  BNP: BNP (last 3 results) Recent Labs    10/07/20 0229  BNP 4,299.0*    ProBNP (last 3 results) No results for input(s): PROBNP in the last 8760 hours.   D-Dimer No results for input(s): DDIMER in the last 72 hours. Hemoglobin A1C No results for input(s): HGBA1C in the last 72 hours. Fasting Lipid Panel No results for input(s): CHOL, HDL, LDLCALC, TRIG, CHOLHDL, LDLDIRECT in the last 72 hours. Thyroid Function Tests No results for input(s): TSH, T4TOTAL, T3FREE, THYROIDAB in the last 72 hours.  Invalid input(s): FREET3  Other results:   Imaging    No results found.   Medications:     Scheduled Medications: . atorvastatin  20 mg Oral Daily  . Chlorhexidine Gluconate Cloth  6 each Topical Q0600  . insulin aspart  0-6 Units Subcutaneous Q4H  . insulin glargine  10 Units Subcutaneous Daily  . pantoprazole  40 mg Oral Daily  . sodium chloride flush  3 mL Intravenous Q12H  . sodium chloride flush  3 mL Intravenous Q12H    Infusions: . sodium chloride    . amiodarone 30 mg/hr (10/22/20 0708)  . furosemide (LASIX) 200 mg in dextrose 5% 100 mL ('2mg'$ /mL) infusion 30 mg/hr (10/22/20 0513)  . heparin 1,450 Units/hr (10/22/20 0213)    PRN Medications: sodium chloride, acetaminophen, acetaminophen, albuterol, dextrose, hydrocortisone cream,  ondansetron (ZOFRAN) IV, ondansetron (ZOFRAN) IV, sodium chloride flush, sodium chloride flush  Assessment/Plan   1. A Flutter RVR - Started on amiodarone drip. - Failed attempt at [place] 3/18 - HR 90s-100s today, NSR with PAC's vs ectopic atrial rhythms.   Has been on IV amio will switch to oral  - TSH normal - Continue IV heparin getting TDC today  2. 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  - Echo this admit w/ severe biventricular HF with EF < 15%.  BNP > 4000 . Suspect tachy-mediated. HS Trop - no trend. No chest pain. No LHC for now. Could consider ischemic work up later if renal function improves. Susepct he may need RHC  - PICC placed initial Co-ox 40%. Started Milrinone 0.25 - Had [place] 3/24 with elevated filling pressures R>L and preserved cardiac output.  - CO-OX 60% off milrinone - no HD yesterday and no urine output, CVP 9 today getting UF removed today in HD - at this point I agree with nephrology will go ahead and stop lasix drip    - No bb w/ low output  - No arb/dig/spiro with ESRD  - c/w unna boots.   3. AKI on CKD Stage IIIa - Creatinine on admit 2.8.  Most recent creatinine was 1.55 back in 03/2020. - started on milrinone for low output - Has temporary HD cath and got UF runs x 4 days.  Kidneys not recovering despite improved CO with milrinone and NSR  - Remains oliguric/anuric, nephrology setting up for [facility] and outpatient dialysis  4. DMII - Hgb A1C 5.9  - On lantus/SSI  5. Lower Extremity Edema - Dopplers negative for DVT - 2/2 CHF Continue UF removal + unna boots   6. 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  -Continue PPI  7. HTN  -Soft.   8. Hypervolemic Hyponatremia -Restrict free water, dialysis.   Length of Stay: [place], MD  10/22/2020, 7:40 AM  Advanced Heart Failure Team Pager [phone] (M-F; 7a - 5p)   Patient seen and examined with the above-signed Advanced Practice Provider and/or  Housestaff. I personally reviewed laboratory data, imaging studies and relevant notes. I independently examined the patient and formulated the important aspects of the plan. I have edited the note to reflect any of my changes or salient points. I have personally discussed the plan with the patient and/or family.  Had HD this am. Feeling better. Off diuretics. Co-ox 60% off milrinone No SOB, orthopnea or PND.   General:  Well appearing. No resp difficulty HEENT: normal Neck: supple. RIJ HD cath. Carotids 2+ bilat; no bruits. No lymphadenopathy or thryomegaly appreciated. Cor: PMI  nondisplaced. Regular rate & rhythm. No rubs, gallops or murmurs. Lungs: clear Abdomen: obese soft, nontender, nondistended. No hepatosplenomegaly. No bruits or masses. Good bowel sounds. Extremities: no cyanosis, clubbing, rash, 1-2+ edema Neuro: alert & orientedx3, cranial nerves grossly intact. moves all 4 extremities w/o difficulty. Affect pleasant  He remains in NSR. He is now HD dependent. Seems to be tolerating well. Plans for tunneled cath noted. I am hopeful EF will recover with maintenance of NSR. If committed to HD would favor coronary angiography prior to d/c to ensure no high grade CAD given multiple RFs. Will plan Friday. Keep on heparin for now. Change IV amio to po.   Glori Bickers, MD  11:44 AM

## 2020-10-22 NOTE — Progress Notes (Signed)
   RHC/LHC set up for Friday at 0900 per Dr Haroldine Laws.   Tredarius Cobern NP-C  11:55 AM

## 2020-10-22 NOTE — Progress Notes (Signed)
Per IR patient will have tunelled cath tomorrow NPO MN

## 2020-10-22 NOTE — Progress Notes (Signed)
[facility] Progress Note    Assessment/ Plan:   Assessment/Recommendations: Kyle Hicks is a/an 80 y.o. 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 Fib/A. Flutter, CKD Stage 3a, T2DM, HTN, obesity.  1. AKI: Likely a combination of hypoperfusion secondary to tachycardia and vascular congestion secondary to cardiorenal syndrome in setting of new EF 10-15%. -s/p temp HD line 3/24, had UF 3/25 and 3/26 for diuretic refractory volume overload -D/C diuretics for lack of efficacy - Will plan for ongoing RRT while monitoring for renal recovery -Requested TDC from IR, appreciate -CLIP AKI - baseline wasn't terrible and if his heart recovers function perhaps we will see improved renal function -Continue to monitor for improvement in renal function and avoid further insults by avoiding nephrotoxins, avoiding hypotension. Cont to hold RAAS inhibition fo rnow.  -Monitor Daily I/Os, Daily weight  -With advanced age and significant comorbids dialysis wouldn't be easy but his pre hospital functional status is good - independent living, driving.  Plan is to eval through the week and if has ongoing RRT needs will discuss with him in more detail what that would look like so he can make an informed decision if it's needed.   A particular challenge would be if milrinone is needed outpt.   2. 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  UACR initially normal in 2018; markedly elevated in 2021 at 612. Additional differential includes obesity related GN.  Baseline creatinine was previously 1.2 - 1.6.   3. Acute Biventricular Heart Failure: Previous hx of HFpEF with progression to HFrEF with EF of 10-15%; suspected to be in the setting of A. Fib with RVR.  -HF following -- off milrinone now and cardiology hopeful with A fib therapy his EF  will rebound in the coming weeks/months.  -volume management per above  4. Hyponatremia (hypervolemic) -improved with UF, currently mildly hyponatremic; cont fluid restriction/na restriction, UF  5. A. Fib with RVR: Failed DCCV on 3/18.  Currently on Amiodarone. Per cardiology.   6. Type 2 Diabetes Mellitus: -per primary  7. Hx of HTN: Previously on HCTZ, Nifedipine and Ramipril. Currently all three are being held. BP has been soft during this admission. 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 At that time, patient refused further evaluation. LFTs WNL on admission.  Defer to primary   [name] [facility] Pager [phone]   Subjective:   UOP yesterday 4m.  CVP 9 this AM.  He feels fine.  No new complaints.    Objective:   BP 112/64   Pulse 69   Temp 98.5 F (36.9 C) (Oral)   Resp 19   Ht '5\' 6"'$  (1.676 m)   Wt 108.5 kg   SpO2 98%   BMI 38.61 kg/m   Intake/Output Summary (Last 24 hours) at 10/22/2020 0849 Last data filed at 10/22/2020 0339 Gross per 24 hour  Intake 1105.1 ml  Output 0 ml  Net 1105.1 ml   Weight change: 0.953 kg  Physical Exam: [id number] gentleman, sitting up in bed CVS: irregularly irregular. No murmurs Resp:No wheezing or rales. No respiratory distress.  Abd: obese, soft, nt  Ext: trace pitting edema of 4 extr Neuro: awake, interactive, speech clear and coherent Access:  RIJ trialysis c/d/i temporary  Imaging: No results found.  Labs: BMET Recent Labs  Lab 10/16/20 0500 10/16/20 1318 10/17/20 0456 10/18/20 0500 10/19/20 0500 10/20/20 0309 10/21/20 0419 10/22/20 0412  NA 133* 133*  136 127* 130* 132* 131* 130* 129*  K 3.6 3.7  3.4* 3.5 3.1* 3.5 3.8 3.7 3.5  CL 100  --  93* 94* 98 99 97* 95*  CO2 23  --  20* '24 23 23 23 22  '$ GLUCOSE 134*  --  318* 275* 112* 105* 95 96  BUN 54*  --  52* 31* 38* 41* 45* 50*  CREATININE 3.48*   --  3.52* 2.72* 3.52* 4.29* 4.80* 5.45*  CALCIUM 8.1*  --  7.7* 7.7* 8.0* 7.9* 7.9* 8.1*   CBC Recent Labs  Lab 10/19/20 0500 10/20/20 0309 10/21/20 0419 10/22/20 0412  WBC 5.5 5.1 5.1 5.3  HGB 9.8* 9.9* 9.7* 9.9*  HCT 29.1* 29.0* 27.7* 28.8*  MCV 83.6 83.1 82.4 82.1  PLT 134* 133* 136* 141*    Medications:    . amiodarone  200 mg Oral BID  . atorvastatin  20 mg Oral Daily  . Chlorhexidine Gluconate Cloth  6 each Topical Q0600  . insulin aspart  0-6 Units Subcutaneous Q4H  . insulin glargine  10 Units Subcutaneous Daily  . pantoprazole  40 mg Oral Daily  . sodium chloride flush  3 mL Intravenous Q12H  . sodium chloride flush  3 mL Intravenous Q12H   10/22/2020, 8:49 AM

## 2020-10-22 NOTE — Progress Notes (Signed)
OT Cancellation Note  Patient Details Name: Kyle Hicks MRN: [id number] DOB: 18-Aug-1940   Cancelled Treatment:    Reason Eval/Treat Not Completed: Patient at procedure or test/ unavailable;Other (comment) pt in HD, will check back as time allows.  Harley Alto., COTA/L Acute Rehabilitation Services [phone]   Precious Haws 10/22/2020, 10:47 AM

## 2020-10-22 NOTE — Progress Notes (Signed)
Per MD, patient may go to dialysis before IR.

## 2020-10-22 NOTE — Progress Notes (Signed)
[place] for Xarelto>Heparin Indication: atrial fibrillation  No Known Allergies  Patient Measurements: Height: '5\' 6"'$  (167.6 cm) Weight: 108.4 kg (239 lb) IBW/kg (Calculated) : 63.8 Heparin Dosing Weight: 89.3 kg  Vital Signs: Temp: 98.6 F (37 C) (03/29 2331) Temp Source: Oral (03/29 2331) BP: 93/58 (03/29 2331) Pulse Rate: 72 (03/29 2331)  Labs: Recent Labs    10/20/20 0309 10/21/20 0419 10/21/20 1330 10/22/20 0412  HGB 9.9* 9.7*  --  9.9*  HCT 29.0* 27.7*  --  28.8*  PLT 133* 136*  --  141*  HEPARINUNFRC 0.26* 0.26* 0.31 0.25*  CREATININE 4.29* 4.80*  --  5.45*    Estimated Creatinine Clearance: 13.1 mL/min (A) (by C-G formula based on SCr of 5.45 mg/dL (H)).   Assessment: 80 yo M with new Afib with RVR. No AC noted PTA. Was started on heparin then switched to Xarelto 3/16 and received 1 dose. Pharmacy asked to switch back to IV heparin. Patient now s/p unsuccessful DCCV but converted to NSR on amio gtt.  Heparin level down to subtherapeutic (0.25) on gtt at 1450 units/hr. Rn reports that IV line infiltrated at some time overnight and IV team had to replace this morning around 0200. Likely this is why heparin level low this morning.  Goal of Therapy:  Heparin level 0.3-0.7 units/ml Monitor platelets by anticoagulation protocol: Yes   Plan:  Continue IV heparin at 1450 units/hr F/u switch to Eliquis post-procedure  Sherlon Handing, PharmD, BCPS Please see amion for complete clinical pharmacist phone list 10/22/2020  5:16 AM

## 2020-10-22 NOTE — Progress Notes (Signed)
Renal Navigator met with patient at HD bedside. He was pleasant and states he is doing well. We discussed referral for outpatient HD and he seemed unsure of where the HD clinic is that he wishes to be referred to. He states he wants the clinic on "[address] right next to the hospital." Navigator explained that there is not a dialysis clinic on [address] and that both the [place] clinic and the [facility] clinic are fairly equidistant to the hospital in different directions. When Navigator showed him a map (albeit very small on a cell phone) it looks like [facility] clinic) is at one end of [address] and [facility] ([place] [facility]) is on the other end of [address]. Ultimately, patient seems fine with the one closest to his home, which is the [facility] clinic, where the referral has already been submitted, if he is accepted. Navigator explained that we will need to see who has a seat available for him. He stated understanding and agreement and states he was driving himself prior to this hospitalization and accepts recommendation not to drive immediately, to ask his wife to take him to and from HD treatments until he sees how he feels after outpatient treatments.  Navigator still awaits TDC procedure note to send to Admissions to complete referral.   Alphonzo Cruise, [place] Renal Navigator [phone]

## 2020-10-23 ENCOUNTER — Inpatient Hospital Stay (HOSPITAL_COMMUNITY): Payer: Medicare HMO

## 2020-10-23 DIAGNOSIS — I4819 Other persistent atrial fibrillation: Secondary | ICD-10-CM | POA: Diagnosis not present

## 2020-10-23 DIAGNOSIS — I10 Essential (primary) hypertension: Secondary | ICD-10-CM | POA: Diagnosis not present

## 2020-10-23 DIAGNOSIS — I5041 Acute combined systolic (congestive) and diastolic (congestive) heart failure: Secondary | ICD-10-CM | POA: Diagnosis not present

## 2020-10-23 DIAGNOSIS — N179 Acute kidney failure, unspecified: Secondary | ICD-10-CM | POA: Diagnosis not present

## 2020-10-23 DIAGNOSIS — I484 Atypical atrial flutter: Secondary | ICD-10-CM | POA: Diagnosis not present

## 2020-10-23 HISTORY — PX: IR US GUIDANCE: IMG2393

## 2020-10-23 HISTORY — PX: IR FLUORO GUIDE CV LINE RIGHT: IMG2283

## 2020-10-23 LAB — BASIC METABOLIC PANEL
Anion gap: 9 (ref 5–15)
BUN: 35 mg/dL — ABNORMAL HIGH (ref 8–23)
CO2: 25 mmol/L (ref 22–32)
Calcium: 8.2 mg/dL — ABNORMAL LOW (ref 8.9–10.3)
Chloride: 99 mmol/L (ref 98–111)
Creatinine, Ser: 4.4 mg/dL — ABNORMAL HIGH (ref 0.61–1.24)
GFR, Estimated: 13 mL/min — ABNORMAL LOW (ref >60)
Glucose, Bld: 81 mg/dL (ref 70–99)
Potassium: 3.7 mmol/L (ref 3.5–5.1)
Sodium: 133 mmol/L — ABNORMAL LOW (ref 135–145)

## 2020-10-23 LAB — GLUCOSE, CAPILLARY
Glucose-Capillary: 81 mg/dL (ref 70–99)
Glucose-Capillary: 76 mg/dL (ref 70–99)
Glucose-Capillary: 94 mg/dL (ref 70–99)
Glucose-Capillary: 85 mg/dL (ref 70–99)
Glucose-Capillary: 137 mg/dL — ABNORMAL HIGH (ref 70–99)
Glucose-Capillary: 105 mg/dL — ABNORMAL HIGH (ref 70–99)

## 2020-10-23 LAB — COOXEMETRY PANEL
Carboxyhemoglobin: 1.2 % (ref 0.5–1.5)
Methemoglobin: 1 % (ref 0.0–1.5)
O2 Saturation: 60.8 %
Total hemoglobin: 10.3 g/dL — ABNORMAL LOW (ref 12.0–16.0)

## 2020-10-23 LAB — CBC
HCT: 30.1 % — ABNORMAL LOW (ref 39.0–52.0)
Hemoglobin: 10.1 g/dL — ABNORMAL LOW (ref 13.0–17.0)
MCH: 27.8 pg (ref 26.0–34.0)
MCHC: 33.6 g/dL (ref 30.0–36.0)
MCV: 82.9 fL (ref 80.0–100.0)
Platelets: 135 10*3/uL — ABNORMAL LOW (ref 150–400)
RBC: 3.63 MIL/uL — ABNORMAL LOW (ref 4.22–5.81)
RDW: 19.8 % — ABNORMAL HIGH (ref 11.5–15.5)
WBC: 5.2 10*3/uL (ref 4.0–10.5)
nRBC: 0 % (ref 0.0–0.2)

## 2020-10-23 LAB — MAGNESIUM: Magnesium: 2 mg/dL (ref 1.7–2.4)

## 2020-10-23 LAB — HEPARIN LEVEL (UNFRACTIONATED): Heparin Unfractionated: 0.39 IU/mL (ref 0.30–0.70)

## 2020-10-23 MED ORDER — MIDAZOLAM HCL 2 MG/2ML IJ SOLN
INTRAMUSCULAR | Status: AC
  Filled 2020-10-23: qty 2

## 2020-10-23 MED ORDER — CEFAZOLIN SODIUM-DEXTROSE 2-4 GM/100ML-% IV SOLN
2.0000 g | INTRAVENOUS | Status: AC
  Administered 2020-10-23: 2 g via INTRAVENOUS
  Filled 2020-10-23: qty 100

## 2020-10-23 MED ORDER — LIDOCAINE HCL 1 % IJ SOLN
INTRAMUSCULAR | Status: AC
  Filled 2020-10-23: qty 20

## 2020-10-23 MED ORDER — ASPIRIN 81 MG PO CHEW
81.0000 mg | CHEWABLE_TABLET | ORAL | Status: AC
  Administered 2020-10-24: 81 mg via ORAL
  Filled 2020-10-23: qty 1

## 2020-10-23 MED ORDER — FENTANYL CITRATE (PF) 100 MCG/2ML IJ SOLN
INTRAMUSCULAR | Status: AC
  Filled 2020-10-23: qty 2

## 2020-10-23 MED ORDER — SODIUM CHLORIDE 0.9 % IV SOLN: 250.0000 mL | INTRAVENOUS | Status: DC | PRN

## 2020-10-23 MED ORDER — HEPARIN SODIUM (PORCINE) 1000 UNIT/ML IJ SOLN
INTRAMUSCULAR | Status: AC
  Filled 2020-10-23: qty 1

## 2020-10-23 MED ORDER — LIDOCAINE HCL 1 % IJ SOLN
INTRAMUSCULAR | Status: AC | PRN
  Administered 2020-10-23: 5 mL

## 2020-10-23 MED ORDER — SODIUM CHLORIDE 0.9% FLUSH: 3.0000 mL | INTRAVENOUS | Status: DC | PRN

## 2020-10-23 MED ORDER — FENTANYL CITRATE (PF) 100 MCG/2ML IJ SOLN
INTRAMUSCULAR | Status: AC | PRN
  Administered 2020-10-23: 25 ug via INTRAVENOUS

## 2020-10-23 MED ORDER — SODIUM CHLORIDE 0.9 % IV SOLN: INTRAVENOUS | Status: DC

## 2020-10-23 MED ORDER — SODIUM CHLORIDE 0.9 % IV SOLN
INTRAVENOUS | Status: AC | PRN
  Administered 2020-10-23: 10 mL/h via INTRAVENOUS

## 2020-10-23 MED ORDER — MIDAZOLAM HCL 2 MG/2ML IJ SOLN
INTRAMUSCULAR | Status: AC | PRN
  Administered 2020-10-23: 0.5 mg via INTRAVENOUS
  Administered 2020-10-23: 1 mg via INTRAVENOUS

## 2020-10-23 MED ORDER — HEPARIN (PORCINE) 25000 UT/250ML-% IV SOLN
1600.0000 [IU]/h | INTRAVENOUS | Status: DC
  Administered 2020-10-23: 1450 [IU]/h via INTRAVENOUS
  Filled 2020-10-23: qty 250

## 2020-10-23 NOTE — Progress Notes (Addendum)
Patient ID: ANDREY EADS, male   DOB: July 11, 1941, 80 y.o.   MRN: [id number]     Advanced Heart Failure Rounding Note  PCP-Cardiologist: No primary care provider on file.    Patient Profile   80 y/o male with obesity and HTN. Admitted with acute HF in setting of AF with RVR of unknown duration.   Echo LVEF 10-15%. RV severely reduced. PICC placed with initial co-ox 40% suggesting markedly low output. CVP up  Subjective:    TEE 3/18 w/ severe Biv dysfunction, Mod-severe MR, severe TR. No LAA thrombus. DCCV unsuccessful  3/24 Diuresing with lasix drip and poor response.  RHC with elevated filling pressures R>L HF , CO 6/CI 2.7. Creatinine 3.5  Temp HD cath placed.  3/25 started removing UF   CVP 10, coox stable at 61%.  2L removed yesterday. Going for [facility] today.  Denies dyspnea, cp.   Objective:   Weight Range: 106.4 kg Body mass index is 37.86 kg/m.   Vital Signs:   Temp:  [97.4 F (36.3 C)-99.6 F (37.6 C)] 99.6 F (37.6 C) (03/31 0401) Pulse Rate:  [69-89] 75 (03/31 0401) Resp:  [16-19] 16 (03/31 0401) BP: (89-126)/(56-77) 99/63 (03/31 0401) SpO2:  [98 %-100 %] 99 % (03/31 0401) Weight:  [106.2 kg-106.4 kg] 106.4 kg (03/31 0401) Last BM Date: 10/22/20  Weight change: Filed Weights   10/22/20 0720 10/22/20 1029 10/23/20 0401  Weight: 108.5 kg 106.2 kg 106.4 kg    Intake/Output:   Intake/Output Summary (Last 24 hours) at 10/23/2020 0742 Last data filed at 10/22/2020 2211 Gross per 24 hour  Intake 131.86 ml  Output 2100 ml  Net -1968.14 ml      Physical Exam  CVP 10 General: NAD Cardiac:  normal rate and rhythm, clear s1 and s2, no murmurs, rubs or gallops, upper and lower extremity edema, legs in unna boots Pulmonary: anterior breath sounds clear, not in distress Abdominal: non distended abdomen, soft and nontender Psych: Alert, conversant, in good spirits    Telemetry   Sinus  70's-80's  Labs    CBC Recent Labs    10/22/20 0412  10/23/20 0500  WBC 5.3 5.2  HGB 9.9* 10.1*  HCT 28.8* 30.1*  MCV 82.1 82.9  PLT 141* [id number]*   Basic Metabolic Panel Recent Labs    10/22/20 0412 10/23/20 0500  NA 129* 133*  K 3.5 3.7  CL 95* 99  CO2 22 25  GLUCOSE 96 81  BUN 50* 35*  CREATININE 5.45* 4.40*  CALCIUM 8.1* 8.2*  MG 1.7 2.0   Liver Function Tests No results for input(s): AST, ALT, ALKPHOS, BILITOT, PROT, ALBUMIN in the last 72 hours. No results for input(s): LIPASE, AMYLASE in the last 72 hours. Cardiac Enzymes No results for input(s): CKTOTAL, CKMB, CKMBINDEX, TROPONINI in the last 72 hours.  BNP: BNP (last 3 results) Recent Labs    10/07/20 0229  BNP 4,299.0*    ProBNP (last 3 results) No results for input(s): PROBNP in the last 8760 hours.   D-Dimer No results for input(s): DDIMER in the last 72 hours. Hemoglobin A1C No results for input(s): HGBA1C in the last 72 hours. Fasting Lipid Panel No results for input(s): CHOL, HDL, LDLCALC, TRIG, CHOLHDL, LDLDIRECT in the last 72 hours. Thyroid Function Tests No results for input(s): TSH, T4TOTAL, T3FREE, THYROIDAB in the last 72 hours.  Invalid input(s): FREET3  Other results:   Imaging    No results found.   Medications:     Scheduled  Medications: . amiodarone  200 mg Oral BID  . atorvastatin  20 mg Oral Daily  . Chlorhexidine Gluconate Cloth  6 each Topical Q0600  . insulin aspart  0-6 Units Subcutaneous Q4H  . insulin glargine  10 Units Subcutaneous Daily  . pantoprazole  40 mg Oral Daily  . sodium chloride flush  3 mL Intravenous Q12H  . sodium chloride flush  3 mL Intravenous Q12H  . sodium chloride flush  3 mL Intravenous Q12H    Infusions: . sodium chloride    . heparin 1,450 Units/hr (10/22/20 0213)    PRN Medications: sodium chloride, acetaminophen, acetaminophen, albuterol, dextrose, heparin sodium (porcine), hydrocortisone cream, ondansetron (ZOFRAN) IV, ondansetron (ZOFRAN) IV, sodium chloride flush, sodium  chloride flush  Assessment/Plan   1. A Flutter RVR - Started on amiodarone drip. - Failed attempt at [place] 3/18 - HR 90s-100s today, NSR with PAC's vs ectopic atrial rhythms.   Has been on IV amio will switch to oral  - TSH normal - Continue IV heparin getting TDC today doesn't look like he will be getting fistula this hospitalization  2. 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  - Echo this admit w/ severe biventricular HF with EF < 15%.  BNP > 4000 . Suspect tachy-mediated. HS Trop - no trend. No chest pain.   - PICC placed initial Co-ox 40%. Started Milrinone 0.25 converted to NSR then milrinone weaned off - Had [place] 3/24 with elevated filling pressures R>L and preserved cardiac output.  - CO-OX 61% off milrinone    - No bb w/ low output  - No arb/dig/spiro with ESRD  - c/w unna boots.  -R/LHC scheduled for Friday at 0900  3. AKI on CKD Stage IIIa - Creatinine on admit 2.8.  Most recent creatinine was 1.55 back in 03/2020. - started on milrinone for low output - Kidneys didn't recover despite improved CO with milrinone and NSR, now HD dependent, tolerating HD without milrinone support - Nephrology setting up for [facility] (today) and outpatient dialysis  4. DMII - Hgb A1C 5.9  - On lantus/SSI  5.Upper/Lower Extremity Edema - Dopplers negative for DVT - 2/2 CHF Continue UF removal + unna boots   6. 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  -Continue PPI  7. HTN  -Soft.   8. Hypervolemic Hyponatremia -Restrict free water, dialysis.   Length of Stay: [place], MD  10/23/2020, 7:42 AM  Advanced Heart Failure Team Pager [phone] (M-F; 7a - 5p)    Patient seen and examined with the above-signed Advanced Practice Provider and/or Housestaff. I personally reviewed laboratory data, imaging studies and relevant notes. I independently examined the patient and formulated the important aspects of the plan. I have edited the note to  reflect any of my changes or salient points. I have personally discussed the plan with the patient and/or family.  Co-ox stable off milrinone. CVP 9-10. Tolerated HD again today. TDC placed. Remains anuric. Denies SOB, orthopnea or PND. Remains in NSR.   General:  Lying in bed. No resp difficulty HEENT: normal Neck: supple. JVP 10. RIJ cath Carotids 2+ bilat; no bruits. No lymphadenopathy or thryomegaly appreciated. Cor: PMI nondisplaced. Regular rate & rhythm. No rubs, gallops or murmurs. Lungs: clear Abdomen: obese soft, nontender, nondistended. No hepatosplenomegaly. No bruits or masses. Good bowel sounds. Extremities: no cyanosis, clubbing, rash, 1+ edema + UNNA Neuro: alert & orientedx3, cranial nerves grossly intact. moves all 4 extremities w/o  difficulty. Affect pleasant  He is tolerating HD well. Volume status improving. TDC now in. Co-ox stable off inotropes. Plan R/L cath tomorrow to exclude high grade CAD. Limit contrast. (will do femorally).   Hopefully EF will improve with maintenance of NSR.   Start NOAC after cath.   Glori Bickers, MD  12:45 PM

## 2020-10-23 NOTE — Progress Notes (Signed)
[place] for Xarelto>Heparin Indication: atrial fibrillation  No Known Allergies  Patient Measurements: Height: '5\' 6"'$  (167.6 cm) Weight: 106.4 kg (234 lb 9.1 oz) IBW/kg (Calculated) : 63.8 Heparin Dosing Weight: 89.3 kg  Vital Signs: Temp: 98.1 F (36.7 C) (03/31 1158) Temp Source: Oral (03/31 1158) BP: 112/98 (03/31 1158) Pulse Rate: 75 (03/31 1158)  Labs: Recent Labs    10/21/20 0419 10/21/20 1330 10/22/20 0412 10/23/20 0500 10/23/20 0501  HGB 9.7*  --  9.9* 10.1*  --   HCT 27.7*  --  28.8* 30.1*  --   PLT 136*  --  141* 135*  --   HEPARINUNFRC 0.26* 0.31 0.25*  --  0.39  CREATININE 4.80*  --  5.45* 4.40*  --     Estimated Creatinine Clearance: 16.1 mL/min (A) (by C-G formula based on SCr of 4.4 mg/dL (H)).   Assessment: 80 yo M with new Afib with RVR. No AC noted PTA. Was started on heparin then switched to Xarelto 3/16 and received 1 dose. Pharmacy asked to switch back to IV heparin. Patient now s/p unsuccessful DCCV but converted to NSR on amio gtt.  Heparin level is therapeutic today at 0.39, on 1450 units/hr. Hgb 10.1, plt 135. No s/sx of bleeding. Underwent R IJ HD cath 3/31 - per protocol will restart 4 hours after.   Goal of Therapy:  Heparin level 0.3-0.7 units/ml Monitor platelets by anticoagulation protocol: Yes   Plan:  Restart IV heparin at 1450 units/hr on 3/31'@1600'$  F/u switch to Eliquis post-cath tomorrow Monitor CBC, s/sx of bleeding, HL daily   Antonietta Jewel, PharmD, [place] Pharmacist  Phone: [phone] 10/23/2020 12:33 PM  Please check AMION for all [place] phone numbers After 10:00 PM, call [place] [phone]

## 2020-10-23 NOTE — Progress Notes (Signed)
Spoke to a Therapist, sports from [employer]. Verbal consent to give AM meds with the exception of blood thinners before tunnel cath. Will inform primary RN.

## 2020-10-23 NOTE — Progress Notes (Signed)
PT Cancellation Note  Patient Details Name: Kyle Hicks MRN: [id number] DOB: [birth year]   Cancelled Treatment:    Reason Eval/Treat Not Completed: Patient at procedure or test/unavailable. Will follow-up for PT treatment as schedule permits.  Mabeline Caras, PT, DPT Acute Rehabilitation Services  Pager [phone] Office [place] 10/23/2020, 11:10 AM

## 2020-10-23 NOTE — Progress Notes (Signed)
Pt has arrived to unit.  Heparin gtt was stopped by this RN per pre procedure.  No new orders to restart hep gtt. Pharmacist notified.  Vitals taken.  Back on monitor. Pt is currently in bed with eyes close. Bed alarm at bedside. Pt denies any pain. Will continue to monitor.

## 2020-10-23 NOTE — Procedures (Signed)
Interventional Radiology Procedure Note ? ?Procedure: RT IJ HD CATH   ? ?Complications: None ? ?Estimated Blood Loss:  MIN ? ?Findings: ?TIP SVCRA   ? ?M. TREVOR Paysen Goza, MD ? ? ? ?

## 2020-10-23 NOTE — Progress Notes (Signed)
[facility] Progress Note    Assessment/ Plan:   Assessment/Recommendations: Kyle Hicks is a/an 80 y.o. 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 Fib/A. Flutter, CKD Stage 3a, T2DM, HTN, obesity.  1. AKI: Likely a combination of hypoperfusion secondary to tachycardia and vascular congestion secondary to cardiorenal syndrome in setting of new EF 10-15%.  -s/p temp HD line 3/24, had UF 3/25 and 3/26 for diuretic refractory volume overload -D/Cd diuretics for lack of efficacy ~ anuric now unfortunatley - Will plan for ongoing RRT while monitoring for renal recovery -Requested TDC from IR, appreciate - being done today -CLIP AKI - baseline wasn't terrible and if his heart recovers function perhaps we will see improved renal function -Plans for [facility] tomorrow -- in light of severely reduced EF I discussed with patient it's reasonable to proceed to [place] to r/o ischemic issue that could be corrected; he's aware of risk with iodinated contrast may limit/delay renal recovery but agreeable to proceed. -Continue to monitor for improvement in renal function and avoid further insults by avoiding nephrotoxins, avoiding hypotension. Cont to hold RAAS inhibition fo now.  -Monitor Daily I/Os, Daily weight   2. 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  UACR initially normal in 2018; markedly elevated in 2021 at 612. Additional differential includes obesity related GN.  Baseline creatinine was previously 1.2 - 1.6.   3. Acute Biventricular Heart Failure: Previous hx of HFpEF with progression to HFrEF with EF of 10-15%; suspected to be in the setting of A. Fib with RVR.  -HF following -- off milrinone now and cardiology hopeful with A fib therapy his EF will rebound in the coming weeks/months.  -LHC planned 4/1 r/o ischemic issue  amenable to intervention -volume management per above  4. Hyponatremia (hypervolemic) -improved with UF, currently mildly hyponatremic; cont fluid restriction/na restriction, UF  5. A. Fib with RVR: DCCV on 3/18.  Currently on Amiodarone. Per cardiology.   6. Type 2 Diabetes Mellitus: -per primary  7. Hx of HTN: Previously on HCTZ, Nifedipine and Ramipril. Currently all three are being held. BP has been soft during this admission. 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 At that time, patient refused further evaluation. LFTs WNL on admission.  Defer to primary   Kyle Hick MD [facility] Pager [phone]   Subjective:   UOP yesterday 40m.  CVP 9 this AM.  He feels fine.  No new complaints.    Objective:   BP 120/66 (BP Location: Left Arm)   Pulse 75   Temp 98.5 F (36.9 C) (Oral)   Resp 18   Ht '5\' 6"'$  (1.676 m)   Wt 106.4 kg   SpO2 98%   BMI 37.86 kg/m   Intake/Output Summary (Last 24 hours) at 10/23/2020 1045 Last data filed at 10/22/2020 2211 Gross per 24 hour  Intake 54.54 ml  Output 100 ml  Net -45.46 ml   Weight change: 0.09 kg  Physical Exam: [id number] gentleman, sitting up in bed CVS: irregularly irregular. No murmurs Resp:No wheezing or rales. No respiratory distress.  Abd: obese, soft, nt  Ext: trace pitting edema of 4 extr Neuro: awake, interactive, speech clear and coherent Access:  RIJ trialysis c/d/i temporary  Imaging: No results found. Labs: [employer]  10/17/20 0456 10/18/20 0500 10/19/20 0500 10/20/20 0309 10/21/20 0419 10/22/20 0412 10/23/20 0500  NA 127* 130* 132* 131* 130* 129* 133*  K 3.5 3.1* 3.5 3.8 3.7 3.5 3.7  CL 93* 94* 98 99 97* 95* 99  CO2 20* '24 23 23 23 22 25  '$ GLUCOSE 318* 275* 112* 105* 95 96 81  BUN 52* 31* 38* 41* 45* 50* 35*  CREATININE 3.52* 2.72* 3.52* 4.29* 4.80* 5.45* 4.40*  CALCIUM 7.7* 7.7* 8.0*  7.9* 7.9* 8.1* 8.2*   CBC Recent Labs  Lab 10/20/20 0309 10/21/20 0419 10/22/20 0412 10/23/20 0500  WBC 5.1 5.1 5.3 5.2  HGB 9.9* 9.7* 9.9* 10.1*  HCT 29.0* 27.7* 28.8* 30.1*  MCV 83.1 82.4 82.1 82.9  PLT 133* 136* 141* 135*    Medications:    . amiodarone  200 mg Oral BID  . atorvastatin  20 mg Oral Daily  . Chlorhexidine Gluconate Cloth  6 each Topical Q0600  . insulin aspart  0-6 Units Subcutaneous Q4H  . insulin glargine  10 Units Subcutaneous Daily  . pantoprazole  40 mg Oral Daily  . sodium chloride flush  3 mL Intravenous Q12H  . sodium chloride flush  3 mL Intravenous Q12H  . sodium chloride flush  3 mL Intravenous Q12H   10/23/2020, 10:45 AM

## 2020-10-23 NOTE — TOC Benefit Eligibility Note (Signed)
Patient Teacher, English as a foreign language completed.    The patient is currently admitted and upon discharge could be taking Eliquis 5 mg.  The current 30 day co-pay is, $47.00.   The patient is insured through [place] [place] Patient Advocate Specialist [place] Team Direct Number: [phone]  Fax: [phone]

## 2020-10-23 NOTE — Progress Notes (Signed)
PROGRESS NOTE    Kyle Hicks  [id number] DOB: Apr 05, 1941 DOA: 10/07/2020 PCP: Sharilyn Sites, MD    Brief Narrative:  Mr. Milham admitted to the hospitalwith theworking diagnosis of acute on chronic systolic heart failure decompensation, complicated with pulmonary HTN class 2, acute core pulmonale, biventricular failure and worsening renal failure. Now on hemodialysis  80 year old male past medical history for hypertension, heart failure, type 2 diabetes mellitus and history of GI bleed who presented after mechanical fall. On the day of admission he got up to use the restroom, felt very week and sustained a mechanical fall. No head trauma or loss of consciousness. Reported lower extremity edema, dyspnea, wheezing and palpitations. Positive diarrhea. On his initial physical examination heart rate 129-134, respiratory rate 19-43, blood pressure 105/73-130 /108, oxygen saturation 98% on supplemental oxygen. His lungs had no wheezing or rales, heart S1-S2, present, rhythmic, tachycardic, abdomen soft, positive lower extremity edema.  Chest radiograph with cardiomegaly, positive hilar vascular congestion, small right pleural effusion.  EKG 133 bpm, left axis deviation, Q waves lead II, lead III, aVF, prolonged QTc545, right bundle branch block,atrial flutter, no ST segment changes, negative T wave V1-V3.  Patientsuspected low output heart failure andstarted on milrinone infusion along with furosemide drip. Amiodarone drip for rate control atrial flutter.  Right heart cath with PCW 22 with PA mean 33 and cardiac output 6.0 per Fick and 6.9 per thermodilution. (on milrinone 0.25 mcg/kg/hr) Right HD IJ HD cathter placed for further fluid removal per ultrafiltration.  Underwent ultrafiltrationon03/25 (3,000 ml)and on 03/26 (2,800 ml)with good toleration  Patient successfully weaned of furosemide drip and milrinone, improved heart rate with amiodarone.   Unfortunately  renal function not recovering, likely will need long term hemodialysis.     Assessment & Plan:   Principal Problem:   Acute CHF (congestive heart failure) (HCC) Active Problems:   Acute kidney injury superimposed on chronic kidney disease (HCC)   Essential hypertension   Elevated d-dimer   Obesity (BMI 30-39.9)   Diarrhea   Atrial flutter (HCC)   Atrial fibrillation ([place])   1. Acute on chronic systolic heart failure/ biventricular failure/ acute core pulmonale/ precapillary pulmonary hypertension, class 2.  LV EF 10 to 15%, global hypokinesis, SV 41 RV systolic severely reduced, RVSP 49,1 mmHg.  Bi-atrial enlargement, severe TR.  Right heart catheterization03/24: PA mean 33 PCW 22 PVR 1.8 Cardiac output 6.0 (Fick) 6,9 (thermodilution) on milrinone 0,25 mcg/kg/hr. . Improved volume status, patient now on sinus rhythm, oxygenating 94% on room air.  Continue rate control with amiodarone, holding RAAS inhibition due to unstable GFR. Further volume control with HD.  Plan for left heart cath in am, to rule out ischemic heart disease.   2. Atrial flutter with RVR/ prolonged QTc. Sp unsuccessful cardioversion 03/18.  Continue to maintain sinus rhythm, heart rate has further improved down to the 70's. Anticoagulation with IV heparin, transition to oral after coronary angiography.   3. 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  Continue with no renal recovery, serum Na is 133, K 3,7 and bicarbonate at 25. Cr 4,40  Today had tunneled HD catheter at the right IJ. Continue renal replacement therapy per nephrology recommendations. Will need outpatient HD facility.   4. T2DM with dyslipidemiaContinue glucose control with basal and sliding scale. Patient is tolerating po well, fasting glucose is 81 this am  Continue withatorvastatin   5. Obesity class 3. Calculated BMI is 40,0  6. HTN. Stable  blood pressure off milrinone.  Patient continue to be at high risk for worsening renal and heart failure   Status is: Inpatient  Remains inpatient appropriate because:IV treatments appropriate due to intensity of illness or inability to take PO   Dispo: The patient is from: Home              Anticipated d/c is to: Home              Patient currently is not medically stable to d/c.   Difficult to place patient No   DVT prophylaxis: Heparin   Code Status:   full  Family Communication:  No family at the bedside     Consultants:   Nephrology   cardiology   Procedures:   Right IJ HD cathter, (temporary and tunneled)       Subjective: Patient is feeling better, no nausea or vomiting, dyspnea and lower extremity edema continue to improve, no chest pain.   Objective: Vitals:   10/22/20 1945 10/22/20 2310 10/23/20 0401 10/23/20 0744  BP: 106/60 109/66 99/63 120/66  Pulse: 76 80 75 75  Resp: '18 18 16 18  '$ Temp: 98.4 F (36.9 C) (!) 97.4 F (36.3 C) 99.6 F (37.6 C) 98.5 F (36.9 C)  TempSrc: Oral Axillary Oral Oral  SpO2: 99% 100% 99% 98%  Weight:   106.4 kg   Height:        Intake/Output Summary (Last 24 hours) at 10/23/2020 1002 Last data filed at 10/22/2020 2211 Gross per 24 hour  Intake 54.54 ml  Output 2100 ml  Net -2045.46 ml   Filed Weights   10/22/20 0720 10/22/20 1029 10/23/20 0401  Weight: 108.5 kg 106.2 kg 106.4 kg    Examination:   General: Not in pain or dyspnea, deconditioned  Neurology: somnolent post procedure, but easy to arouse,  E ENT: no pallor, no icterus, oral mucosa moist Cardiovascular: No JVD. S1-S2 present, rhythmic, no gallops, rubs, or murmurs. ++ lower extremity edema. Unna boots in place.  Pulmonary: positive breath sounds bilaterally, no wheezing Gastrointestinal. Abdomen protuberant, non tender Skin. No rashes Musculoskeletal: no joint deformities     Data Reviewed: I have personally reviewed following labs and  imaging studies  CBC: Recent Labs  Lab 10/19/20 0500 10/20/20 0309 10/21/20 0419 10/22/20 0412 10/23/20 0500  WBC 5.5 5.1 5.1 5.3 5.2  HGB 9.8* 9.9* 9.7* 9.9* 10.1*  HCT 29.1* 29.0* 27.7* 28.8* 30.1*  MCV 83.6 83.1 82.4 82.1 82.9  PLT 134* 133* 136* 141* [id number]*   Basic Metabolic Panel: Recent Labs  Lab 10/19/20 0500 10/20/20 0309 10/21/20 0419 10/22/20 0412 10/23/20 0500  NA 132* 131* 130* 129* 133*  K 3.5 3.8 3.7 3.5 3.7  CL 98 99 97* 95* 99  CO2 '23 23 23 22 25  '$ GLUCOSE 112* 105* 95 96 81  BUN 38* 41* 45* 50* 35*  CREATININE 3.52* 4.29* 4.80* 5.45* 4.40*  CALCIUM 8.0* 7.9* 7.9* 8.1* 8.2*  MG  --   --  1.5* 1.7 2.0   GFR: Estimated Creatinine Clearance: 16.1 mL/min (A) (by C-G formula based on SCr of 4.4 mg/dL (H)). Liver Function Tests: No results for input(s): AST, ALT, ALKPHOS, BILITOT, PROT, ALBUMIN in the last 168 hours. No results for input(s): LIPASE, AMYLASE in the last 168 hours. No results for input(s): AMMONIA in the last 168 hours. Coagulation Profile: No results for input(s): INR, PROTIME in the last 168 hours. Cardiac Enzymes: No results for input(s): CKTOTAL, CKMB, CKMBINDEX, TROPONINI in the last 168 hours. BNP (last  3 results) No results for input(s): PROBNP in the last 8760 hours. HbA1C: No results for input(s): HGBA1C in the last 72 hours. CBG: Recent Labs  Lab 10/22/20 1530 10/22/20 2001 10/22/20 2307 10/23/20 0434 10/23/20 0744  GLUCAP 92 169* 119* 81 76   Lipid Profile: No results for input(s): CHOL, HDL, LDLCALC, TRIG, CHOLHDL, LDLDIRECT in the last 72 hours. Thyroid Function Tests: No results for input(s): TSH, T4TOTAL, FREET4, T3FREE, THYROIDAB in the last 72 hours. Anemia Panel: No results for input(s): VITAMINB12, FOLATE, FERRITIN, TIBC, IRON, RETICCTPCT in the last 72 hours.    Radiology Studies: I have reviewed all of the imaging during this hospital visit personally     Scheduled Meds: . amiodarone  200 mg Oral BID   . atorvastatin  20 mg Oral Daily  . Chlorhexidine Gluconate Cloth  6 each Topical Q0600  . insulin aspart  0-6 Units Subcutaneous Q4H  . insulin glargine  10 Units Subcutaneous Daily  . pantoprazole  40 mg Oral Daily  . sodium chloride flush  3 mL Intravenous Q12H  . sodium chloride flush  3 mL Intravenous Q12H  . sodium chloride flush  3 mL Intravenous Q12H   Continuous Infusions: . sodium chloride    .  ceFAZolin (ANCEF) IV    . heparin 1,450 Units/hr (10/22/20 0213)     LOS: 16 days        Daryll Spisak Gerome Apley, MD

## 2020-10-24 ENCOUNTER — Encounter (HOSPITAL_COMMUNITY)
Admission: EM | Disposition: A | Discharge status: Home Health | Payer: Self-pay | Source: Home / Self Care | Attending: Internal Medicine

## 2020-10-24 ENCOUNTER — Encounter (HOSPITAL_COMMUNITY): Payer: Self-pay | Admitting: Internal Medicine

## 2020-10-24 DIAGNOSIS — I2510 Atherosclerotic heart disease of native coronary artery without angina pectoris: Secondary | ICD-10-CM

## 2020-10-24 DIAGNOSIS — I5021 Acute systolic (congestive) heart failure: Secondary | ICD-10-CM | POA: Diagnosis not present

## 2020-10-24 HISTORY — PX: RIGHT HEART CATH AND CORONARY ANGIOGRAPHY: CATH118264

## 2020-10-24 LAB — COOXEMETRY PANEL
Carboxyhemoglobin: 1.2 % (ref 0.5–1.5)
Methemoglobin: 1.1 % (ref 0.0–1.5)
O2 Saturation: 59.7 %
Total hemoglobin: 10.2 g/dL — ABNORMAL LOW (ref 12.0–16.0)

## 2020-10-24 LAB — BASIC METABOLIC PANEL
Anion gap: 10 (ref 5–15)
BUN: 39 mg/dL — ABNORMAL HIGH (ref 8–23)
CO2: 23 mmol/L (ref 22–32)
Calcium: 8 mg/dL — ABNORMAL LOW (ref 8.9–10.3)
Chloride: 100 mmol/L (ref 98–111)
Creatinine, Ser: 5.25 mg/dL — ABNORMAL HIGH (ref 0.61–1.24)
GFR, Estimated: 11 mL/min — ABNORMAL LOW (ref >60)
Glucose, Bld: 81 mg/dL (ref 70–99)
Potassium: 3.6 mmol/L (ref 3.5–5.1)
Sodium: 133 mmol/L — ABNORMAL LOW (ref 135–145)

## 2020-10-24 LAB — POCT I-STAT EG7
Acid-base deficit: 1 mmol/L (ref 0.0–2.0)
Acid-base deficit: 2 mmol/L (ref 0.0–2.0)
Bicarbonate: 23.7 mmol/L (ref 20.0–28.0)
Bicarbonate: 24.1 mmol/L (ref 20.0–28.0)
Calcium, Ion: 1.13 mmol/L — ABNORMAL LOW (ref 1.15–1.40)
Calcium, Ion: 1.15 mmol/L (ref 1.15–1.40)
HCT: 32 % — ABNORMAL LOW (ref 39.0–52.0)
HCT: 33 % — ABNORMAL LOW (ref 39.0–52.0)
Hemoglobin: 10.9 g/dL — ABNORMAL LOW (ref 13.0–17.0)
Hemoglobin: 11.2 g/dL — ABNORMAL LOW (ref 13.0–17.0)
O2 Saturation: 65 %
O2 Saturation: 67 %
Potassium: 3.9 mmol/L (ref 3.5–5.1)
Potassium: 3.9 mmol/L (ref 3.5–5.1)
Sodium: 133 mmol/L — ABNORMAL LOW (ref 135–145)
Sodium: 133 mmol/L — ABNORMAL LOW (ref 135–145)
TCO2: 25 mmol/L (ref 22–32)
TCO2: 25 mmol/L (ref 22–32)
pCO2, Ven: 42.3 mmHg — ABNORMAL LOW (ref 44.0–60.0)
pCO2, Ven: 43 mmHg — ABNORMAL LOW (ref 44.0–60.0)
pH, Ven: 7.357 (ref 7.250–7.430)
pH, Ven: 7.357 (ref 7.250–7.430)
pO2, Ven: 35 mmHg (ref 32.0–45.0)
pO2, Ven: 36 mmHg (ref 32.0–45.0)

## 2020-10-24 LAB — HEPARIN LEVEL (UNFRACTIONATED): Heparin Unfractionated: 0.24 IU/mL — ABNORMAL LOW (ref 0.30–0.70)

## 2020-10-24 LAB — GLUCOSE, CAPILLARY
Glucose-Capillary: 85 mg/dL (ref 70–99)
Glucose-Capillary: 91 mg/dL (ref 70–99)
Glucose-Capillary: 86 mg/dL (ref 70–99)
Glucose-Capillary: 100 mg/dL — ABNORMAL HIGH (ref 70–99)
Glucose-Capillary: 162 mg/dL — ABNORMAL HIGH (ref 70–99)

## 2020-10-24 LAB — PROTIME-INR
INR: 1.4 — ABNORMAL HIGH (ref 0.8–1.2)
Prothrombin Time: 16.4 seconds — ABNORMAL HIGH (ref 11.4–15.2)

## 2020-10-24 LAB — POCT I-STAT 7, (LYTES, BLD GAS, ICA,H+H)
Acid-base deficit: 4 mmol/L — ABNORMAL HIGH (ref 0.0–2.0)
Bicarbonate: 21.4 mmol/L (ref 20.0–28.0)
Calcium, Ion: 0.96 mmol/L — ABNORMAL LOW (ref 1.15–1.40)
HCT: 30 % — ABNORMAL LOW (ref 39.0–52.0)
Hemoglobin: 10.2 g/dL — ABNORMAL LOW (ref 13.0–17.0)
O2 Saturation: 98 %
Potassium: 3.4 mmol/L — ABNORMAL LOW (ref 3.5–5.1)
Sodium: 137 mmol/L (ref 135–145)
TCO2: 22 mmol/L (ref 22–32)
pCO2 arterial: 37.5 mmHg (ref 32.0–48.0)
pH, Arterial: 7.364 (ref 7.350–7.450)
pO2, Arterial: 107 mmHg (ref 83.0–108.0)

## 2020-10-24 LAB — CBC
HCT: 29.7 % — ABNORMAL LOW (ref 39.0–52.0)
Hemoglobin: 9.9 g/dL — ABNORMAL LOW (ref 13.0–17.0)
MCH: 27.9 pg (ref 26.0–34.0)
MCHC: 33.3 g/dL (ref 30.0–36.0)
MCV: 83.7 fL (ref 80.0–100.0)
Platelets: 136 10*3/uL — ABNORMAL LOW (ref 150–400)
RBC: 3.55 MIL/uL — ABNORMAL LOW (ref 4.22–5.81)
RDW: 20.1 % — ABNORMAL HIGH (ref 11.5–15.5)
WBC: 5.6 10*3/uL (ref 4.0–10.5)
nRBC: 0 % (ref 0.0–0.2)

## 2020-10-24 SURGERY — RIGHT HEART CATH AND CORONARY ANGIOGRAPHY
Anesthesia: LOCAL

## 2020-10-24 MED ORDER — SODIUM CHLORIDE 0.9 % IV SOLN: 250.0000 mL | INTRAVENOUS | Status: DC | PRN

## 2020-10-24 MED ORDER — HEPARIN (PORCINE) IN NACL 1000-0.9 UT/500ML-% IV SOLN
INTRAVENOUS | Status: AC
  Filled 2020-10-24: qty 1500

## 2020-10-24 MED ORDER — LIDOCAINE HCL (PF) 1 % IJ SOLN
INTRAMUSCULAR | Status: AC
  Filled 2020-10-24: qty 30

## 2020-10-24 MED ORDER — SODIUM CHLORIDE 0.9% FLUSH: 3.0000 mL | INTRAVENOUS | Status: DC | PRN

## 2020-10-24 MED ORDER — ACETAMINOPHEN 325 MG PO TABS: 650.0000 mg | ORAL_TABLET | ORAL | Status: DC | PRN

## 2020-10-24 MED ORDER — ONDANSETRON HCL 4 MG/2ML IJ SOLN: 4.0000 mg | Freq: Four times a day (QID) | INTRAMUSCULAR | Status: DC | PRN

## 2020-10-24 MED ORDER — FENTANYL CITRATE (PF) 100 MCG/2ML IJ SOLN
INTRAMUSCULAR | Status: AC
  Filled 2020-10-24: qty 2

## 2020-10-24 MED ORDER — KIDNEY FAILURE BOOK: Freq: Once | Status: AC

## 2020-10-24 MED ORDER — SODIUM CHLORIDE 0.9% FLUSH
3.0000 mL | Freq: Two times a day (BID) | INTRAVENOUS | Status: DC
  Administered 2020-10-24 – 2020-10-27 (×3): 3 mL via INTRAVENOUS

## 2020-10-24 MED ORDER — LABETALOL HCL 5 MG/ML IV SOLN: 10.0000 mg | INTRAVENOUS | Status: AC | PRN

## 2020-10-24 MED ORDER — MIDAZOLAM HCL 2 MG/2ML IJ SOLN
INTRAMUSCULAR | Status: DC | PRN
  Administered 2020-10-24: 1 mg via INTRAVENOUS

## 2020-10-24 MED ORDER — IOHEXOL 350 MG/ML SOLN
INTRAVENOUS | Status: DC | PRN
  Administered 2020-10-24: 95 mL

## 2020-10-24 MED ORDER — MIDAZOLAM HCL 2 MG/2ML IJ SOLN
INTRAMUSCULAR | Status: AC
  Filled 2020-10-24: qty 2

## 2020-10-24 MED ORDER — FENTANYL CITRATE (PF) 100 MCG/2ML IJ SOLN
INTRAMUSCULAR | Status: DC | PRN
  Administered 2020-10-24: 25 ug via INTRAVENOUS

## 2020-10-24 MED ORDER — HEPARIN (PORCINE) IN NACL 1000-0.9 UT/500ML-% IV SOLN
INTRAVENOUS | Status: DC | PRN
  Administered 2020-10-24 (×2): 500 mL

## 2020-10-24 MED ORDER — LIDOCAINE HCL (PF) 1 % IJ SOLN
INTRAMUSCULAR | Status: DC | PRN
  Administered 2020-10-24: 15 mL

## 2020-10-24 MED ORDER — GERHARDT'S BUTT CREAM
TOPICAL_CREAM | CUTANEOUS | Status: DC | PRN
  Filled 2020-10-24: qty 1

## 2020-10-24 MED ORDER — HYDRALAZINE HCL 20 MG/ML IJ SOLN: 10.0000 mg | INTRAMUSCULAR | Status: AC | PRN

## 2020-10-24 MED ORDER — APIXABAN 5 MG PO TABS
5.0000 mg | ORAL_TABLET | Freq: Two times a day (BID) | ORAL | Status: DC
  Administered 2020-10-24 – 2020-10-27 (×6): 5 mg via ORAL
  Filled 2020-10-24 (×7): qty 1

## 2020-10-24 SURGICAL SUPPLY — 18 items
CATH INFINITI 4FR 145 PIGTAIL (CATHETERS) ×1 IMPLANT
CATH INFINITI 4FR 3 DRC (CATHETERS) ×1 IMPLANT
CATH INFINITI 4FR LCB (CATHETERS) ×1 IMPLANT
CATH INFINITI 5 FR 3DRC (CATHETERS) ×1 IMPLANT
CATH INFINITI 5 FR LCB (CATHETERS) ×1 IMPLANT
CATH INFINITI 5FR JL5 (CATHETERS) ×1 IMPLANT
CATH INFINITI 5FR MULTPACK ANG (CATHETERS) ×1 IMPLANT
CATH SWAN GANZ 7F STRAIGHT (CATHETERS) ×1 IMPLANT
CLOSURE MYNX CONTROL 5F (Vascular Products) ×1 IMPLANT
GUIDEWIRE .025 260CM (WIRE) ×1 IMPLANT
KIT HEART LEFT (KITS) ×1 IMPLANT
PACK CARDIAC CATHETERIZATION (CUSTOM PROCEDURE TRAY) ×2 IMPLANT
SHEATH PINNACLE 5F 10CM (SHEATH) ×1 IMPLANT
SHEATH PINNACLE 7F 10CM (SHEATH) ×1 IMPLANT
SHEATH PROBE COVER 6X72 (BAG) ×1 IMPLANT
SYR MEDRAD MARK 7 150ML (SYRINGE) ×1 IMPLANT
TRANSDUCER W/STOPCOCK (MISCELLANEOUS) ×2 IMPLANT
WIRE EMERALD 3MM-J .035X150CM (WIRE) ×1 IMPLANT

## 2020-10-24 NOTE — Progress Notes (Signed)
Rounded on patient today in correlation to transition to outpatient HD. Patient found lying in bed with permission granted to speak about HD. Ordered consult to dietician and Kidney Failure book. Patient educated at the bedside regarding care of tunneled dialysis catheter. Newly placed HD catheter yesterday.  Patient also educated on the importance of adhering to scheduled dialysis treatments, the effects of fluid overload, hyperkalemia and hyperphosphatemia. Patient capable of re-verbalizing via teach back method.   Patient plan to go to the cath lab today and arrived in patients room. Patient with no further questions at this time. Handouts and contact information provided to patient for any further assistance. Will continue to follow as appropriate.   Dorthey Sawyer, RN  Dialysis Nurse Coordinator Phone: [phone]

## 2020-10-24 NOTE — Progress Notes (Signed)
Renal Navigator met with patient at bedside to provide outpatient HD clinic information verbally and in writing (see Navigator note from earlier today for details). Patient states understanding and appreciation. He continues to agree for wife to transport until he feels well enough to drive post discharge.   Alphonzo Cruise, [place] Renal Navigator [phone]

## 2020-10-24 NOTE — Progress Notes (Signed)
[place] for Xarelto>Heparin > Eliquis Indication: atrial fibrillation  No Known Allergies  Patient Measurements: Height: '5\' 6"'$  (167.6 cm) Weight: 106.3 kg (234 lb 5.6 oz) IBW/kg (Calculated) : 63.8 Heparin Dosing Weight: 89.3 kg  Vital Signs: Temp: 98.5 F (36.9 C) (04/01 1142) Temp Source: Oral (04/01 1142) BP: 123/69 (04/01 1230) Pulse Rate: 74 (04/01 1230)  Labs: Recent Labs    10/22/20 0412 10/23/20 0500 10/23/20 0501 10/24/20 0501  HGB 9.9* 10.1*  --  9.9*  HCT 28.8* 30.1*  --  29.7*  PLT 141* 135*  --  136*  LABPROT  --   --   --  16.4*  INR  --   --   --  1.4*  HEPARINUNFRC 0.25*  --  0.39 0.24*  CREATININE 5.45* 4.40*  --  5.25*    Estimated Creatinine Clearance: 13.5 mL/min (A) (by C-G formula based on SCr of 5.25 mg/dL (H)).   Assessment: 80 yo M with new Afib with RVR. No AC noted PTA. Was started on heparin then switched to Xarelto 3/16 and received 1 dose. Pharmacy asked to switch back to IV heparin. Patient now s/p unsuccessful DCCV but converted to NSR on amio gtt.  Heparin level was slightly subtherapeutic early this AM, rate increased.  Then turned off for cath procedure.  Goal of Therapy:  Heparin level 0.3-0.7 units/ml Monitor platelets by anticoagulation protocol: Yes   Plan:  Planning to start Eliquis 5 mg po BID after cath. Will need to educate patient about Eliquis prior to discharge.  [place] Crane, Roylene Reason, BCCP Clinical Pharmacist  10/24/2020 12:51 PM   [facility] pharmacy phone numbers are listed on [email or web address]

## 2020-10-24 NOTE — Interval H&P Note (Signed)
History and Physical Interval Note:  10/24/2020 9:37 AM  Kyle Hicks  has presented today for surgery, with the diagnosis of heart failure.  The various methods of treatment have been discussed with the patient and family. After consideration of risks, benefits and other options for treatment, the patient has consented to  Procedure(s): RIGHT/LEFT HEART CATH AND CORONARY ANGIOGRAPHY (N/A) possible coronary angioplasty as a surgical intervention.  The patient's history has been reviewed, patient examined, no change in status, stable for surgery.  I have reviewed the patient's chart and labs.  Questions were answered to the patient's satisfaction.     [name]

## 2020-10-24 NOTE — Progress Notes (Signed)
OT Cancellation Note  Patient Details Name: Kyle Hicks MRN: [id number] DOB: [birth year]   Cancelled Treatment:    Reason Eval/Treat Not Completed: Patient declined, no reason specified;Other (comment) Pt declines OT session as pt awaiting heart cath today and is currently declining OOB mobility or BADL participation. Will check back as time allows.  Corinne Ports K., COTA/L Acute Rehabilitation Services [phone]   Precious Haws 10/24/2020, 8:28 AM

## 2020-10-24 NOTE — Progress Notes (Addendum)
Patient ID: BRENTON HOAGLIN, male   DOB: [birth year], 80 y.o.   MRN: [id number]     Advanced Heart Failure Rounding Note  PCP-Cardiologist: No primary care provider on file.    Patient Profile   80 y/o male with obesity and HTN. Admitted with acute HF in setting of AF with RVR of unknown duration.   Echo LVEF 10-15%. RV severely reduced. PICC placed with initial co-ox 40% suggesting markedly low output. CVP up  Subjective:    TEE 3/18 w/ severe Biv dysfunction, Mod-severe MR, severe TR. No LAA thrombus. DCCV unsuccessful  3/24 Diuresing with lasix drip and poor response.  RHC with elevated filling pressures R>L HF , CO 6/CI 2.7. Creatinine 3.5  Temp HD cath placed.  3/25 started removing UF   CVP 13, coox stable at 60%. Breathing well, no chest pain, says he's ready for the procedure   Objective:   Weight Range: 106.3 kg Body mass index is 37.82 kg/m.   Vital Signs:   Temp:  [97.5 F (36.4 C)-98.7 F (37.1 C)] 97.5 F (36.4 C) (04/01 0727) Pulse Rate:  [56-77] 77 (04/01 0727) Resp:  [14-19] 18 (04/01 0727) BP: (105-133)/(55-98) 110/65 (04/01 0727) SpO2:  [94 %-100 %] 99 % (04/01 0727) Weight:  [106.3 kg] 106.3 kg (04/01 0600) Last BM Date: 10/23/20  Weight change: Filed Weights   10/22/20 1029 10/23/20 0401 10/24/20 0600  Weight: 106.2 kg 106.4 kg 106.3 kg    Intake/Output:   Intake/Output Summary (Last 24 hours) at 10/24/2020 0745 Last data filed at 10/24/2020 0353 Gross per 24 hour  Intake 790.73 ml  Output 200 ml  Net 590.73 ml      Physical Exam  CVP 13 General: NAD Cardiac:  normal rate and rhythm, clear s1 and s2, no murmurs, rubs or gallops, upper and lower extremity edema, legs in unna boots Pulmonary: anterior breath sounds clear, not in distress Abdominal: non distended abdomen, soft and nontender Psych: Alert, conversant, in good spirits    Telemetry   Sinus  70's-80's  Labs    CBC Recent Labs    10/23/20 0500 10/24/20 0501  WBC 5.2  5.6  HGB 10.1* 9.9*  HCT 30.1* 29.7*  MCV 82.9 83.7  PLT 135* [id number]*   Basic Metabolic Panel Recent Labs    10/22/20 0412 10/23/20 0500 10/24/20 0501  NA 129* 133* 133*  K 3.5 3.7 3.6  CL 95* 99 100  CO2 '22 25 23  '$ GLUCOSE 96 81 81  BUN 50* 35* 39*  CREATININE 5.45* 4.40* 5.25*  CALCIUM 8.1* 8.2* 8.0*  MG 1.7 2.0  --    Liver Function Tests No results for input(s): AST, ALT, ALKPHOS, BILITOT, PROT, ALBUMIN in the last 72 hours. No results for input(s): LIPASE, AMYLASE in the last 72 hours. Cardiac Enzymes No results for input(s): CKTOTAL, CKMB, CKMBINDEX, TROPONINI in the last 72 hours.  BNP: BNP (last 3 results) Recent Labs    10/07/20 0229  BNP 4,299.0*    ProBNP (last 3 results) No results for input(s): PROBNP in the last 8760 hours.   D-Dimer No results for input(s): DDIMER in the last 72 hours. Hemoglobin A1C No results for input(s): HGBA1C in the last 72 hours. Fasting Lipid Panel No results for input(s): CHOL, HDL, LDLCALC, TRIG, CHOLHDL, LDLDIRECT in the last 72 hours. Thyroid Function Tests No results for input(s): TSH, T4TOTAL, T3FREE, THYROIDAB in the last 72 hours.  Invalid input(s): FREET3  Other results:   Imaging  IR Fluoro Guide CV Line Right  Result Date: 10/23/2020 INDICATION: Renal failure, CHF, access for permanent dialysis EXAM: ULTRASOUND GUIDANCE FOR VASCULAR ACCESS RIGHT INTERNAL JUGULAR PERMANENT HEMODIALYSIS CATHETER Date:  10/23/2020 10/23/2020 11:41 am Radiologist:  M. Daryll Brod, MD Guidance:  Ultrasound and fluoroscopic FLUOROSCOPY TIME:  Fluoroscopy Time: 0 minutes 18 seconds (6 mGy). MEDICATIONS: 1% lidocaine local ANESTHESIA/SEDATION: Versed 1.5 mg IV; Fentanyl 25 mcg IV; Moderate Sedation Time:  11 minutes The patient was continuously monitored during the procedure by the interventional radiology nurse under my direct supervision. CONTRAST:  None. COMPLICATIONS: None immediate. PROCEDURE: Informed consent was obtained from  the patient following explanation of the procedure, risks, benefits and alternatives. The patient understands, agrees and consents for the procedure. All questions were addressed. A time out was performed. Maximal barrier sterile technique utilized including caps, mask, sterile gowns, sterile gloves, large sterile drape, hand hygiene, and 2% chlorhexidine scrub. Under sterile conditions and local anesthesia, right internal jugular micropuncture venous access was performed with ultrasound. Images were obtained for documentation of the patent right internal jugular vein. A guide wire was inserted followed by a transitional dilator. Next, a 0.035 guidewire was advanced into the IVC with a 5-French catheter. Measurements were obtained from the right venotomy site to the proximal right atrium. In the right infraclavicular chest, a subcutaneous tunnel was created under sterile conditions and local anesthesia. 1% lidocaine with epinephrine was utilized for this. The 23 cm tip to cuff palindrome catheter was tunneled subcutaneously to the venotomy site and inserted into the SVC/RA junction through a valved peel-away sheath. Position was confirmed with fluoroscopy. Images were obtained for documentation. Blood was aspirated from the catheter followed by saline and heparin flushes. The appropriate volume and strength of heparin was instilled in each lumen. Caps were applied. The catheter was secured at the tunnel site with Gelfoam and a pursestring suture. The venotomy site was closed with subcuticular Vicryl suture. Dermabond was applied to the small right neck incision. A dry sterile dressing was applied. The catheter is ready for use. No immediate complications. IMPRESSION: Ultrasound and fluoroscopically guided right internal jugular tunneled hemodialysis catheter (23 cm tip to cuff palindrome catheter). Electronically Signed   By: Jerilynn Mages.  Shick M.D.   On: 10/23/2020 11:54   IR US Guidance  Result Date:  10/23/2020 INDICATION: Renal failure, CHF, access for permanent dialysis EXAM: ULTRASOUND GUIDANCE FOR VASCULAR ACCESS RIGHT INTERNAL JUGULAR PERMANENT HEMODIALYSIS CATHETER Date:  10/23/2020 10/23/2020 11:41 am Radiologist:  Jerilynn Mages. Daryll Brod, MD Guidance:  Ultrasound and fluoroscopic FLUOROSCOPY TIME:  Fluoroscopy Time: 0 minutes 18 seconds (6 mGy). MEDICATIONS: 1% lidocaine local ANESTHESIA/SEDATION: Versed 1.5 mg IV; Fentanyl 25 mcg IV; Moderate Sedation Time:  11 minutes The patient was continuously monitored during the procedure by the interventional radiology nurse under my direct supervision. CONTRAST:  None. COMPLICATIONS: None immediate. PROCEDURE: Informed consent was obtained from the patient following explanation of the procedure, risks, benefits and alternatives. The patient understands, agrees and consents for the procedure. All questions were addressed. A time out was performed. Maximal barrier sterile technique utilized including caps, mask, sterile gowns, sterile gloves, large sterile drape, hand hygiene, and 2% chlorhexidine scrub. Under sterile conditions and local anesthesia, right internal jugular micropuncture venous access was performed with ultrasound. Images were obtained for documentation of the patent right internal jugular vein. A guide wire was inserted followed by a transitional dilator. Next, a 0.035 guidewire was advanced into the IVC with a 5-French catheter. Measurements were obtained from the right  venotomy site to the proximal right atrium. In the right infraclavicular chest, a subcutaneous tunnel was created under sterile conditions and local anesthesia. 1% lidocaine with epinephrine was utilized for this. The 23 cm tip to cuff palindrome catheter was tunneled subcutaneously to the venotomy site and inserted into the SVC/RA junction through a valved peel-away sheath. Position was confirmed with fluoroscopy. Images were obtained for documentation. Blood was aspirated from the  catheter followed by saline and heparin flushes. The appropriate volume and strength of heparin was instilled in each lumen. Caps were applied. The catheter was secured at the tunnel site with Gelfoam and a pursestring suture. The venotomy site was closed with subcuticular Vicryl suture. Dermabond was applied to the small right neck incision. A dry sterile dressing was applied. The catheter is ready for use. No immediate complications. IMPRESSION: Ultrasound and fluoroscopically guided right internal jugular tunneled hemodialysis catheter (23 cm tip to cuff palindrome catheter). Electronically Signed   By: Jerilynn Mages.  Shick M.D.   On: 10/23/2020 11:54     Medications:     Scheduled Medications: . amiodarone  200 mg Oral BID  . atorvastatin  20 mg Oral Daily  . Chlorhexidine Gluconate Cloth  6 each Topical Q0600  . insulin aspart  0-6 Units Subcutaneous Q4H  . insulin glargine  10 Units Subcutaneous Daily  . pantoprazole  40 mg Oral Daily  . sodium chloride flush  3 mL Intravenous Q12H  . sodium chloride flush  3 mL Intravenous Q12H  . sodium chloride flush  3 mL Intravenous Q12H    Infusions: . sodium chloride    . sodium chloride    . sodium chloride 10 mL/hr at 10/24/20 0353  . heparin 1,600 Units/hr (10/24/20 0642)    PRN Medications: sodium chloride, sodium chloride, acetaminophen, acetaminophen, albuterol, dextrose, heparin sodium (porcine), hydrocortisone cream, ondansetron (ZOFRAN) IV, ondansetron (ZOFRAN) IV, sodium chloride flush, sodium chloride flush, sodium chloride flush  Assessment/Plan   1. A Flutter RVR - Started on amiodarone drip. - Failed attempt at [place] 3/18 - HR 90s-100s today, NSR with PAC's vs ectopic atrial rhythms.   Has been on IV amio will switch to oral  - TSH normal - Continue IV heparin switch to NOAC after cath  2. 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  - Echo this admit w/ severe biventricular HF with EF < 15%.   BNP > 4000 . Suspect tachy-mediated. HS Trop - no trend. No chest pain.   - PICC placed initial Co-ox 40%. Started Milrinone 0.25 converted to NSR then milrinone weaned off - Had [place] 3/24 with elevated filling pressures R>L and preserved cardiac output.  - CO-OX has remained stable off milrinone today 60% - No bb w/ low output  - No arb/dig/spiro with ESRD  - c/w unna boots.  -R/LHC scheduled for today at 0900  3. AKI on CKD Stage IIIa - Creatinine on admit 2.8.  Most recent creatinine was 1.55 back in 03/2020. - started on milrinone for low output - Kidneys didn't recover despite improved CO with milrinone and NSR, likely HD dependent, tolerating HD without milrinone support - HD cath now in place and being set up for outpatient dialysis  4. DMII - Hgb A1C 5.9  - On lantus/SSI  5.Upper/Lower Extremity Edema - Dopplers negative for DVT - 2/2 CHF Continue UF removal + unna boots   6. 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  -Continue PPI  7. HTN  -Soft.  8. Hypervolemic Hyponatremia -Restrict free water, dialysis.   Length of Stay: [place], MD  10/24/2020, 7:45 AM  Advanced Heart Failure Team Pager [phone] (M-F; 7a - 5p)   Patient seen and examined with the above-signed Advanced Practice Provider and/or Housestaff. I personally reviewed laboratory data, imaging studies and relevant notes. I independently examined the patient and formulated the important aspects of the plan. I have edited the note to reflect any of my changes or salient points. I have personally discussed the plan with the patient and/or family.  Tolerating HD well. Weight down 30 pounds. Co-ox stable off milrinone. Remains in NSR on IV amio   General: Lying in bed  No resp difficulty HEENT: normal Neck: supple. JVP to jaw Carotids 2+ bilat; no bruits. No lymphadenopathy or thryomegaly appreciated. Cor: PMI nondisplaced. Regular rate & rhythm. 2/6 TR Lungs: clear Abdomen: soft, nontender,  nondistended. No hepatosplenomegaly. No bruits or masses. Good bowel sounds. Extremities: no cyanosis, clubbing, rash, 1+ edema Neuro: alert & orientedx3, cranial nerves grossly intact. moves all 4 extremities w/o difficulty. Affect pleasant  Volume status improving with HD. Co-ox stable off milrinone. Remains in NSR on IV amio.   For R/L cath today. Can switch IV amio to po. Switch to Eliquis after cath.   Hopefully EF will improve with maintenance of NSR. Will repeat echo on Monday.   AHF team to see again on Monday. Call with questions over the weekend.   Glori Bickers, MD  9:19 AM

## 2020-10-24 NOTE — Progress Notes (Signed)
PROGRESS NOTE    KEESHON LINEHAN  [id number] DOB: 1940-09-04 DOA: 10/07/2020 PCP: Sharilyn Sites, MD    Brief Narrative:  Mr. Chidester admitted to the hospitalwith theworking diagnosis of acute on chronic systolic heart failure decompensation, complicated with pulmonary HTN class 2, acute core pulmonale, biventricular failure and worsening renal failure. Now on hemodialysis  80 year old male past medical history for hypertension, heart failure, type 2 diabetes mellitus and history of GI bleed who presented after mechanical fall. On the day of admission he got up to use the restroom, felt very week and sustained a mechanical fall. No head trauma or loss of consciousness. Reported lower extremity edema, dyspnea, wheezing and palpitations. Positive diarrhea. On his initial physical examination heart rate 129-134, respiratory rate 19-43, blood pressure 105/73-130 /108, oxygen saturation 98% on supplemental oxygen. His lungs had no wheezing or rales, heart S1-S2, present, rhythmic, tachycardic, abdomen soft, positive lower extremity edema.  Chest radiograph with cardiomegaly, positive hilar vascular congestion, small right pleural effusion.  EKG 133 bpm, left axis deviation, Q waves lead II, lead III, aVF, prolonged QTc545, right bundle branch block,atrial flutter, no ST segment changes, negative T wave V1-V3.  Patientsuspected low output heart failure andstarted on milrinone infusion along with furosemide drip. Amiodarone drip for rate control atrial flutter.  Right heart cath with PCW 22 with PA mean 33 and cardiac output 6.0 per [place] and 6.9 per thermodilution. (on milrinone 0.25 mcg/kg/hr) Right HD IJ HD cathter placed for further fluid removal per ultrafiltration.  Underwent ultrafiltrationon03/25 (3,000 ml)and on 03/26 (2,800 ml)with good toleration  Patient successfully weaned of furosemide drip and milrinone, improved heart rate with amiodarone.   Unfortunately  renal function not recovering, likely will need long term hemodialysis.   10/24/2020: Patient underwent cardiac catheterization today no significant obstruction was found.  Patient remains volume overloaded.  Assessment & Plan:   Principal Problem:   Acute CHF (congestive heart failure) (HCC) Active Problems:   Acute kidney injury superimposed on chronic kidney disease (HCC)   Essential hypertension   Elevated d-dimer   Obesity (BMI 30-39.9)   Diarrhea   Atrial flutter (HCC)   Atrial fibrillation ([place])   1. Acute on chronic systolic heart failure/ biventricular failure/ acute core pulmonale/ precapillary pulmonary hypertension, class 2.  LV EF 10 to 15%, global hypokinesis, SV 41 RV systolic severely reduced, RVSP 49,1 mmHg.  Bi-atrial enlargement, severe TR.  Right heart catheterization03/24: PA mean 33 PCW 22 PVR 1.8 Cardiac output 6.0 (Fick) 6,9 (thermodilution) on milrinone 0,25 mcg/kg/hr. . Improved volume status, patient now on sinus rhythm, oxygenating 94% on room air.  Continue rate control with amiodarone, holding RAAS inhibition due to unstable GFR. Further volume control with HD.  Plan for left heart cath in am, to rule out ischemic heart disease.   2. Atrial flutter with RVR/ prolonged QTc. Sp unsuccessful cardioversion 03/18.  Continue to maintain sinus rhythm, heart rate has further improved down to the 70's. Anticoagulation with IV heparin, transition to oral after coronary angiography.   3. 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  Continue with no renal recovery, serum Na is 133, K 3,7 and bicarbonate at 25. Cr 4,40  Today had tunneled HD catheter at the right IJ. Continue renal replacement therapy per nephrology recommendations. Will need outpatient HD facility.   4. T2DM with dyslipidemiaContinue glucose control with basal and sliding scale. Patient is tolerating po well,  fasting glucose is 81 this am  Continue withatorvastatin   5.  Obesity class 3. Calculated BMI is 40,0  6. HTN. Stable blood pressure off milrinone.    Patient continue to be at high risk for worsening renal and heart failure   Status is: Inpatient  Remains inpatient appropriate because:IV treatments appropriate due to intensity of illness or inability to take PO   Dispo: The patient is from: Home              Anticipated d/c is to: Home              Patient currently is not medically stable to d/c.   Difficult to place patient No   DVT prophylaxis: Heparin   Code Status:   full  Family Communication:  No family at the bedside     Consultants:   Nephrology   cardiology   Procedures:   Right IJ HD cathter, (temporary and tunneled)       Subjective: -No new complaints. -No chest pain. -No fever or chills. -Shortness of breath persist.  Objective: Vitals:   10/24/20 1142 10/24/20 1200 10/24/20 1215 10/24/20 1230  BP: 121/65 120/66 114/68 123/69  Pulse: 70 70 69 74  Resp: 18     Temp: 98.5 F (36.9 C)     TempSrc: Oral     SpO2: 96% 96% 98% 98%  Weight:      Height:        Intake/Output Summary (Last 24 hours) at 10/24/2020 1459 Last data filed at 10/24/2020 0353 Gross per 24 hour  Intake 670.73 ml  Output 200 ml  Net 470.73 ml   Filed Weights   10/22/20 1029 10/23/20 0401 10/24/20 0600  Weight: 106.2 kg 106.4 kg 106.3 kg    Examination:   General: Patient is obese.  Volume overloaded.  Deconditioned.   Neurology: somnolent post procedure, but easy to arouse,  E ENT: no pallor, no icterus, oral mucosa moist Cardiovascular: No JVD. S1-S2 present, rhythmic, no gallops, rubs, or murmurs. ++ lower extremity edema. Unna boots in place.  Pulmonary: positive breath sounds bilaterally, no wheezing Gastrointestinal. Abdomen protuberant, non tender Skin. No rashes Musculoskeletal: no joint deformities     Data Reviewed: I have personally reviewed  following labs and imaging studies  CBC: Recent Labs  Lab 10/20/20 0309 10/21/20 0419 10/22/20 0412 10/23/20 0500 10/24/20 0501  WBC 5.1 5.1 5.3 5.2 5.6  HGB 9.9* 9.7* 9.9* 10.1* 9.9*  HCT 29.0* 27.7* 28.8* 30.1* 29.7*  MCV 83.1 82.4 82.1 82.9 83.7  PLT 133* 136* 141* 135* [id number]*   Basic Metabolic Panel: Recent Labs  Lab 10/20/20 0309 10/21/20 0419 10/22/20 0412 10/23/20 0500 10/24/20 0501  NA 131* 130* 129* 133* 133*  K 3.8 3.7 3.5 3.7 3.6  CL 99 97* 95* 99 100  CO2 '23 23 22 25 23  '$ GLUCOSE 105* 95 96 81 81  BUN 41* 45* 50* 35* 39*  CREATININE 4.29* 4.80* 5.45* 4.40* 5.25*  CALCIUM 7.9* 7.9* 8.1* 8.2* 8.0*  MG  --  1.5* 1.7 2.0  --    GFR: Estimated Creatinine Clearance: 13.5 mL/min (A) (by C-G formula based on SCr of 5.25 mg/dL (H)). Liver Function Tests: No results for input(s): AST, ALT, ALKPHOS, BILITOT, PROT, ALBUMIN in the last 168 hours. No results for input(s): LIPASE, AMYLASE in the last 168 hours. No results for input(s): AMMONIA in the last 168 hours. Coagulation Profile: Recent Labs  Lab 10/24/20 0501  INR 1.4*   Cardiac Enzymes: No results for input(s): CKTOTAL, CKMB, CKMBINDEX, TROPONINI in the last  168 hours. BNP (last 3 results) No results for input(s): PROBNP in the last 8760 hours. HbA1C: No results for input(s): HGBA1C in the last 72 hours. CBG: Recent Labs  Lab 10/23/20 1955 10/23/20 2327 10/24/20 0450 10/24/20 0725 10/24/20 1204  GLUCAP 137* 105* 85 91 86   Lipid Profile: No results for input(s): CHOL, HDL, LDLCALC, TRIG, CHOLHDL, LDLDIRECT in the last 72 hours. Thyroid Function Tests: No results for input(s): TSH, T4TOTAL, FREET4, T3FREE, THYROIDAB in the last 72 hours. Anemia Panel: No results for input(s): VITAMINB12, FOLATE, FERRITIN, TIBC, IRON, RETICCTPCT in the last 72 hours.    Radiology Studies: I have reviewed all of the imaging during this hospital visit personally     Scheduled Meds: . amiodarone  200 mg Oral  BID  . apixaban  5 mg Oral BID  . atorvastatin  20 mg Oral Daily  . Chlorhexidine Gluconate Cloth  6 each Topical Q0600  . insulin aspart  0-6 Units Subcutaneous Q4H  . insulin glargine  10 Units Subcutaneous Daily  . pantoprazole  40 mg Oral Daily  . sodium chloride flush  3 mL Intravenous Q12H  . sodium chloride flush  3 mL Intravenous Q12H  . sodium chloride flush  3 mL Intravenous Q12H  . sodium chloride flush  3 mL Intravenous Q12H   Continuous Infusions: . sodium chloride    . sodium chloride       LOS: 17 days        Bonnell Public, MD

## 2020-10-24 NOTE — Progress Notes (Signed)
Physical Therapy Treatment Patient Details Name: Kyle Hicks MRN: [id number] DOB: 1940/08/29 Today's Date: 10/24/2020    History of Present Illness Pt is a 80 y.o. male admitted 10/07/20 with c/o diarrhea, weakness and fall at home. CXR with enlarged cardiac sillhouette, small R-side pleural effusion. Pt also with tachycardia, HF, AKI. Pt with aflutter RVR 3/17. S/p TEE cardioversion 3/18 showing severe biventricular dysfunction, severe TR, mod-severe MR; DCCV unsuccessful. Course complicated by non-oliguric AKI, likely combo of hypoperfusion secondary to tachycardia and vascular congestion; for renal ultrasound 3/22. Plan RHC and placement of HD cath 3/24. Pt underwent heart cath on 10/24/2020. PMH includes HTN, CHF, DM2, CKD IIIa, PUD, GIB.    PT Comments    Pt tolerates treatment well with increased ambulation distances this session. Pt does continue to demonstrate LE fatigue with longer distances of ambulation, and lacks LE power to stand from lower surfaces. Pt will benefit from reinforcement of transfer technique to improve independence. PT continues to recommend discharge home with HHPT.   Follow Up Recommendations  Home health PT;Supervision for mobility/OOB     Equipment Recommendations  None recommended by PT    Recommendations for Other Services       Precautions / Restrictions Precautions Precautions: Fall;Other (comment) Precaution Comments: watch 02, watch HR (Dr. Mahalia Longest says HR in 140s-150s ok). Restrictions Weight Bearing Restrictions: No    Mobility  Bed Mobility Overal bed mobility: Needs Assistance Bed Mobility: Supine to Sit     Supine to sit: Supervision;HOB elevated     General bed mobility comments: use of rails    Transfers Overall transfer level: Needs assistance Equipment used: Rolling walker (2 wheeled) Transfers: Sit to/from Stand Sit to Stand: Min assist;From elevated surface         General transfer comment: PT provides cues for hand  placement however pt continues to have difficulty understanding to push up from bed when attempting transfers to improve leverage. Pt continues to attempt to push up from walker with BUE  Ambulation/Gait Ambulation/Gait assistance: Min guard Gait Distance (Feet): 140 Feet Assistive device: Rolling walker (2 wheeled) Gait Pattern/deviations: Step-to pattern;Trunk flexed Gait velocity: reduced Gait velocity interpretation: <1.31 ft/sec, indicative of household ambulator General Gait Details: pt with slowed step-to gait, increased trunk flexion as walker height is slightly lower than ideal during ambulation (PT adjusts walker for future use at end of session). With fatigue pt requires cueing to maintain RW closer to BOS   Stairs             Wheelchair Mobility    Modified Rankin (Stroke Patients Only)       Balance Overall balance assessment: Needs assistance Sitting-balance support: No upper extremity supported;Feet supported Sitting balance-[name] Scale: Fair     Standing balance support: Single extremity supported;Bilateral upper extremity supported Standing balance-[name] Scale: Poor Standing balance comment: reliant on BUE support                            Cognition Arousal/Alertness: Awake/alert Behavior During Therapy: Flat affect Overall Cognitive Status: Within Functional Limits for tasks assessed                                        Exercises      General Comments General comments (skin integrity, edema, etc.): VSS on RA, sats in mid 90s, HR stable in 90s, no  A-fib noted      Pertinent Vitals/Pain Pain Assessment: No/denies pain    Home Living                      Prior Function            PT Goals (current goals can now be found in the care plan section) Acute Rehab PT Goals Patient Stated Goal: none stated Progress towards PT goals: Progressing toward goals    Frequency    Min 3X/week      PT Plan  Current plan remains appropriate    Co-evaluation              AM-PAC PT "6 Clicks" Mobility   Outcome Measure  Help needed turning from your back to your side while in a flat bed without using bedrails?: A Little Help needed moving from lying on your back to sitting on the side of a flat bed without using bedrails?: A Little Help needed moving to and from a bed to a chair (including a wheelchair)?: A Little Help needed standing up from a chair using your arms (e.g., wheelchair or bedside chair)?: A Little Help needed to walk in hospital room?: A Little Help needed climbing 3-5 steps with a railing? : A Lot 6 Click Score: 17    End of Session Equipment Utilized During Treatment: Gait belt Activity Tolerance: Patient tolerated treatment well Patient left: in bed;with call bell/phone within reach;with bed alarm set (pt refuses sitting in recliner) Nurse Communication: Mobility status PT Visit Diagnosis: Muscle weakness (generalized) (M62.81);Difficulty in walking, not elsewhere classified (R26.2)     Time: [id number] PT Time Calculation (min) (ACUTE ONLY): 23 min  Charges:                        Zenaida Niece, PT, DPT Acute Rehabilitation Pager: [phone]    Zenaida Niece 10/24/2020, 4:30 PM

## 2020-10-24 NOTE — Progress Notes (Signed)
Renal Navigator faxed TDC procedure note to [facility]. Patient has been accepted at [facility] on [address] in [place]. This clinic is closer to patient's home (half the distance that the [facility] unit). His schedule is TTS 11:45am. He needs to arrive to his appointments at 11:25am. The clinic is prepared for him to start on Tuesday, 10/28/20 if discharged from the hospital by Monday, 4/4. On his first day at the clinic, patient needs to arrive at 10:45am to complete intake paperwork prior to his first treatment.  Navigator notes that patient is in the cath lab currently and will attempt to follow up with him this afternoon to provide seat information verbally and in writing. Nephrologist/Dr. Johnney Ou updated.   Alphonzo Cruise, [place] Renal Navigator [phone]

## 2020-10-24 NOTE — Progress Notes (Signed)
ANTICOAGULATION CONSULT NOTE - Follow Up Consult  Pharmacy Consult for heparin Indication: atrial fibrillation  Labs: Recent Labs    10/22/20 0412 10/23/20 0500 10/23/20 0501 10/24/20 0501  HGB 9.9* 10.1*  --  9.9*  HCT 28.8* 30.1*  --  29.7*  PLT 141* 135*  --  136*  LABPROT  --   --   --  16.4*  INR  --   --   --  1.4*  HEPARINUNFRC 0.25*  --  0.39 0.24*  CREATININE 5.45* 4.40*  --  5.25*    Assessment: 79yo male subtherapeutic on heparin after resuming at previously therapeutic rate; no gtt issues or signs of bleeding per RN.  Goal of Therapy:  Heparin level 0.3-0.7 units/ml   Plan:  Will increase heparin gtt by 1-2 units/kg/hr to 1600 units/hr and check level in 8 hours.    Wynona Neat, PharmD, BCPS  10/24/2020,6:30 AM

## 2020-10-24 NOTE — Progress Notes (Signed)
Nutrition Education Note  RD consulted for nutrition education regarding new  HD.  Spoke with pt at bedside, who reports feeling sleepy after procedure. He reports he has a fair appetite and consumes 2 meals per day, that consist of a meat, starch, and vegetable. Pt reports that his wife does the grocery shopping and prepares his meals; she does not add salt to food. Pt occasionally seasons food with salt and pepper. He shares that he really likes to eat vegetables and avoids sweets.   Pt recalls renal navigator speaking with him yesterday, but is unsure what was talked about. When probed pt, he reports "I just don't remember". Offered education, but pt stated he prefers to wait until his wife is present.   RD provided "Low Sodium Nutrition Therapy" handout from the [employer]. Attached to AVS/ discharge summary.   Current diet order is renal/ carb modified with 1.2 L fluid restriction, patient is consuming approximately 100% of meals at this time. Labs and medications reviewed. No further nutrition interventions warranted at this time. RD contact information provided. If additional nutrition issues arise, please re-consult RD.   Loistine Chance, RD, LDN, [place] Registered Dietitian II Certified Diabetes Care and Education Specialist Please refer to [facility] for RD and/or RD on-call/weekend/after hours pager

## 2020-10-24 NOTE — Progress Notes (Signed)
Kyle Hicks Progress Note    Assessment/ Plan:   Assessment/Recommendations: Kyle Hicks is a/an 80 y.o. 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 Fib/A. Flutter, CKD Stage 3a, T2DM, HTN, obesity.  1. AKI: Likely a combination of hypoperfusion secondary to tachycardia and vascular congestion secondary to cardiorenal syndrome in setting of new EF 10-15%.  -s/p temp HD line 3/24, had UF 3/25 and 3/26 for diuretic refractory volume overload; [facility] 10/23/20 -D/Cd diuretics for lack of efficacy ~ anuric now unfortunatley - Will plan for ongoing RRT while monitoring for renal recovery -CLIP AKI - baseline wasn't terrible and if his heart recovers function perhaps we will see improved renal function. -Continue to monitor for improvement in renal function and avoid further insults by avoiding nephrotoxins, avoiding hypotension. Cont to hold RAAS inhibition fo now. -hold on perm acces given hopeful for recovery  -Monitor Daily I/Os, Daily weight   2. 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  UACR initially normal in 2018; markedly elevated in 2021 at 612. Additional differential includes obesity related GN.  Baseline creatinine was previously 1.2 - 1.6.   3. Acute Biventricular Heart Failure: Previous hx of HFpEF with progression to HFrEF with EF of 10-15%; suspected to be in the setting of A. Fib with RVR.  -HF following -- off milrinone now and cardiology hopeful with A fib therapy his EF will rebound in the coming weeks/months.  -Plans for LHC today-- in light of severely reduced EF I discussed with patient it's reasonable to proceed to [place] to r/o ischemic issue that could be corrected; he's aware of risk with iodinated contrast may limit/delay renal recovery but agreeable to proceed -volume management per above  4.  Hyponatremia (hypervolemic) -improved with UF, currently mildly hyponatremic; cont fluid restriction/na restriction, UF  5. A. Fib with RVR: DCCV on 3/18.  Currently on Amiodarone. Per cardiology.   6. Type 2 Diabetes Mellitus: -per primary  7. Hx of HTN: Previously on HCTZ, Nifedipine and Ramipril. Currently all three are being held. BP has been soft during this admission. 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 At that time, patient refused further evaluation. LFTs WNL on admission.  Defer to primary   Kyle Hicks [facility] Pager [phone]   Subjective:   UOP yesterday 241m.  He feels fine.  No new complaints.  S/p TDC yesterday, no issues.   Objective:   BP 114/69   Pulse 73   Temp (!) 97.5 F (36.4 C) (Oral)   Resp (!) 36   Ht '5\' 6"'$  (1.676 m)   Wt 106.3 kg   SpO2 100%   BMI 37.82 kg/m   Intake/Output Summary (Last 24 hours) at 10/24/2020 1133 Last data filed at 10/24/2020 0353 Gross per 24 hour  Intake 670.73 ml  Output 200 ml  Net 470.73 ml   Weight change: -2.2 kg  Physical Exam: [id number] gentleman, sitting up in bed CVS: irregularly irregular. No murmurs Resp:No wheezing or rales. No respiratory distress.  Abd: obese, soft, nt  Ext: trace pitting edema of 4 extr Neuro: awake, interactive, speech clear and coherent [facility]  [facility]  Imaging: CARDIAC CATHETERIZATION  Result Date: 10/24/2020  Mid LAD lesion is 40% stenosed.  Findings: Ao = 89/52 (68) RA =  10 RV = 45/12 PA = 47/17 (28) PCW = 9 Fick cardiac output/index = 6.6/3.1 PVR =2.9 FA sat = 98% PA sat = 65%, 67% Assessment: 1. 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 RCA not injected 2. Mild PAH 3. 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 Plan/Discussion: Continue medical therapy. Consider CT imaging of thoracic aorta due to resistance  passing catheters in proximal thoracic aorta. Kyle Hicks 11:23 AM   IR Fluoro Guide CV Line Right  Result Date: 10/23/2020 INDICATION: Renal failure, CHF, access for permanent dialysis EXAM: ULTRASOUND GUIDANCE FOR VASCULAR ACCESS RIGHT INTERNAL JUGULAR PERMANENT HEMODIALYSIS CATHETER Date:  10/23/2020 10/23/2020 11:41 am Radiologist:  M. Daryll Brod, Hicks Guidance:  Ultrasound and fluoroscopic FLUOROSCOPY TIME:  Fluoroscopy Time: 0 minutes 18 seconds (6 mGy). MEDICATIONS: 1% lidocaine local ANESTHESIA/SEDATION: Versed 1.5 mg IV; Fentanyl 25 mcg IV; Moderate Sedation Time:  11 minutes The patient was continuously monitored during the procedure by the interventional radiology nurse under my direct supervision. CONTRAST:  None. COMPLICATIONS: None immediate. PROCEDURE: Informed consent was obtained from the patient following explanation of the procedure, risks, benefits and alternatives. The patient understands, agrees and consents for the procedure. All questions were addressed. A time out was performed. Maximal barrier sterile technique utilized including caps, mask, sterile gowns, sterile gloves, large sterile drape, hand hygiene, and 2% chlorhexidine scrub. Under sterile conditions and local anesthesia, right internal jugular micropuncture venous access was performed with ultrasound. Images were obtained for documentation of the patent right internal jugular vein. A guide wire was inserted followed by a transitional dilator. Next, a 0.035 guidewire was advanced into the IVC with a 5-French catheter. Measurements were obtained from the right venotomy site to the proximal right atrium. In the right infraclavicular chest, a subcutaneous tunnel was created under sterile conditions and local anesthesia. 1% lidocaine with epinephrine was utilized for this. The 23 cm tip to cuff palindrome catheter was tunneled subcutaneously to the venotomy site and inserted into the SVC/RA junction through a valved peel-away  sheath. Position was confirmed with fluoroscopy. Images were obtained for documentation. Blood was aspirated from the catheter followed by saline and heparin flushes. The appropriate volume and strength of heparin was instilled in each lumen. Caps were applied. The catheter was secured at the tunnel site with Gelfoam and a pursestring suture. The venotomy site was closed with subcuticular Vicryl suture. Dermabond was applied to the small right neck incision. A dry sterile dressing was applied. The catheter is ready for use. No immediate complications. IMPRESSION: Ultrasound and fluoroscopically guided right internal jugular tunneled hemodialysis catheter (23 cm tip to cuff palindrome catheter). Electronically Signed   By: Jerilynn Mages.  Shick M.D.   On: 10/23/2020 11:54   IR US Guidance  Result Date: 10/23/2020 INDICATION: Renal failure, CHF, access for permanent dialysis EXAM: ULTRASOUND GUIDANCE FOR VASCULAR ACCESS RIGHT INTERNAL JUGULAR PERMANENT HEMODIALYSIS CATHETER Date:  10/23/2020 10/23/2020 11:41 am Radiologist:  Jerilynn Mages. Daryll Brod, Hicks Guidance:  Ultrasound and fluoroscopic FLUOROSCOPY TIME:  Fluoroscopy Time: 0 minutes 18 seconds (6 mGy). MEDICATIONS: 1% lidocaine local ANESTHESIA/SEDATION: Versed 1.5 mg IV; Fentanyl 25 mcg IV; Moderate Sedation Time:  11 minutes The patient was continuously monitored during the procedure by the interventional radiology nurse under my direct supervision. CONTRAST:  None. COMPLICATIONS: None immediate. PROCEDURE: Informed consent was obtained from the patient following explanation of the procedure, risks, benefits and alternatives. The patient understands, agrees and consents for the procedure. All questions were addressed. A time out was  performed. Maximal barrier sterile technique utilized including caps, mask, sterile gowns, sterile gloves, large sterile drape, hand hygiene, and 2% chlorhexidine scrub. Under sterile conditions and local anesthesia, right internal jugular  micropuncture venous access was performed with ultrasound. Images were obtained for documentation of the patent right internal jugular vein. A guide wire was inserted followed by a transitional dilator. Next, a 0.035 guidewire was advanced into the IVC with a 5-French catheter. Measurements were obtained from the right venotomy site to the proximal right atrium. In the right infraclavicular chest, a subcutaneous tunnel was created under sterile conditions and local anesthesia. 1% lidocaine with epinephrine was utilized for this. The 23 cm tip to cuff palindrome catheter was tunneled subcutaneously to the venotomy site and inserted into the SVC/RA junction through a valved peel-away sheath. Position was confirmed with fluoroscopy. Images were obtained for documentation. Blood was aspirated from the catheter followed by saline and heparin flushes. The appropriate volume and strength of heparin was instilled in each lumen. Caps were applied. The catheter was secured at the tunnel site with Gelfoam and a pursestring suture. The venotomy site was closed with subcuticular Vicryl suture. Dermabond was applied to the small right neck incision. A dry sterile dressing was applied. The catheter is ready for use. No immediate complications. IMPRESSION: Ultrasound and fluoroscopically guided right internal jugular tunneled hemodialysis catheter (23 cm tip to cuff palindrome catheter). Electronically Signed   By: Jerilynn Mages.  Shick M.D.   On: 10/23/2020 11:54   Labs: BMET Recent Labs  Lab 10/18/20 0500 10/19/20 0500 10/20/20 0309 10/21/20 0419 10/22/20 0412 10/23/20 0500 10/24/20 0501  NA 130* 132* 131* 130* 129* 133* 133*  K 3.1* 3.5 3.8 3.7 3.5 3.7 3.6  CL 94* 98 99 97* 95* 99 100  CO2 '24 23 23 23 22 25 23  '$ GLUCOSE 275* 112* 105* 95 96 81 81  BUN 31* 38* 41* 45* 50* 35* 39*  CREATININE 2.72* 3.52* 4.29* 4.80* 5.45* 4.40* 5.25*  CALCIUM 7.7* 8.0* 7.9* 7.9* 8.1* 8.2* 8.0*   CBC Recent Labs  Lab 10/21/20 0419  10/22/20 0412 10/23/20 0500 10/24/20 0501  WBC 5.1 5.3 5.2 5.6  HGB 9.7* 9.9* 10.1* 9.9*  HCT 27.7* 28.8* 30.1* 29.7*  MCV 82.4 82.1 82.9 83.7  PLT 136* 141* 135* 136*    Medications:    . [MAR Hold] amiodarone  200 mg Oral BID  . [MAR Hold] atorvastatin  20 mg Oral Daily  . [MAR Hold] Chlorhexidine Gluconate Cloth  6 each Topical Q0600  . [MAR Hold] insulin aspart  0-6 Units Subcutaneous Q4H  . [MAR Hold] insulin glargine  10 Units Subcutaneous Daily  . [MAR Hold] pantoprazole  40 mg Oral Daily  . [MAR Hold] sodium chloride flush  3 mL Intravenous Q12H  . [MAR Hold] sodium chloride flush  3 mL Intravenous Q12H  . [MAR Hold] sodium chloride flush  3 mL Intravenous Q12H   10/24/2020, 11:33 AM

## 2020-10-24 NOTE — H&P (View-Only) (Signed)
Patient ID: Kyle Hicks, male   DOB: 1940-08-29, 80 y.o.   MRN: [id number]     Advanced Heart Failure Rounding Note  PCP-Cardiologist: No primary care provider on file.    Patient Profile   80 y/o male with obesity and HTN. Admitted with acute HF in setting of AF with RVR of unknown duration.   Echo LVEF 10-15%. RV severely reduced. PICC placed with initial co-ox 40% suggesting markedly low output. CVP up  Subjective:    TEE 3/18 w/ severe Biv dysfunction, Mod-severe MR, severe TR. No LAA thrombus. DCCV unsuccessful  3/24 Diuresing with lasix drip and poor response.  RHC with elevated filling pressures R>L HF , CO 6/CI 2.7. Creatinine 3.5  Temp HD cath placed.  3/25 started removing UF   CVP 13, coox stable at 60%. Breathing well, no chest pain, says he's ready for the procedure   Objective:   Weight Range: 106.3 kg Body mass index is 37.82 kg/m.   Vital Signs:   Temp:  [97.5 F (36.4 C)-98.7 F (37.1 C)] 97.5 F (36.4 C) (04/01 0727) Pulse Rate:  [56-77] 77 (04/01 0727) Resp:  [14-19] 18 (04/01 0727) BP: (105-133)/(55-98) 110/65 (04/01 0727) SpO2:  [94 %-100 %] 99 % (04/01 0727) Weight:  [106.3 kg] 106.3 kg (04/01 0600) Last BM Date: 10/23/20  Weight change: Filed Weights   10/22/20 1029 10/23/20 0401 10/24/20 0600  Weight: 106.2 kg 106.4 kg 106.3 kg    Intake/Output:   Intake/Output Summary (Last 24 hours) at 10/24/2020 0745 Last data filed at 10/24/2020 0353 Gross per 24 hour  Intake 790.73 ml  Output 200 ml  Net 590.73 ml      Physical Exam  CVP 13 General: NAD Cardiac:  normal rate and rhythm, clear s1 and s2, no murmurs, rubs or gallops, upper and lower extremity edema, legs in unna boots Pulmonary: anterior breath sounds clear, not in distress Abdominal: non distended abdomen, soft and nontender Psych: Alert, conversant, in good spirits    Telemetry   Sinus  70's-80's  Labs    CBC Recent Labs    10/23/20 0500 10/24/20 0501  WBC 5.2  5.6  HGB 10.1* 9.9*  HCT 30.1* 29.7*  MCV 82.9 83.7  PLT 135* [id number]*   Basic Metabolic Panel Recent Labs    10/22/20 0412 10/23/20 0500 10/24/20 0501  NA 129* 133* 133*  K 3.5 3.7 3.6  CL 95* 99 100  CO2 '22 25 23  '$ GLUCOSE 96 81 81  BUN 50* 35* 39*  CREATININE 5.45* 4.40* 5.25*  CALCIUM 8.1* 8.2* 8.0*  MG 1.7 2.0  --    Liver Function Tests No results for input(s): AST, ALT, ALKPHOS, BILITOT, PROT, ALBUMIN in the last 72 hours. No results for input(s): LIPASE, AMYLASE in the last 72 hours. Cardiac Enzymes No results for input(s): CKTOTAL, CKMB, CKMBINDEX, TROPONINI in the last 72 hours.  BNP: BNP (last 3 results) Recent Labs    10/07/20 0229  BNP 4,299.0*    ProBNP (last 3 results) No results for input(s): PROBNP in the last 8760 hours.   D-Dimer No results for input(s): DDIMER in the last 72 hours. Hemoglobin A1C No results for input(s): HGBA1C in the last 72 hours. Fasting Lipid Panel No results for input(s): CHOL, HDL, LDLCALC, TRIG, CHOLHDL, LDLDIRECT in the last 72 hours. Thyroid Function Tests No results for input(s): TSH, T4TOTAL, T3FREE, THYROIDAB in the last 72 hours.  Invalid input(s): FREET3  Other results:   Imaging  IR Fluoro Guide CV Line Right  Result Date: 10/23/2020 INDICATION: Renal failure, CHF, access for permanent dialysis EXAM: ULTRASOUND GUIDANCE FOR VASCULAR ACCESS RIGHT INTERNAL JUGULAR PERMANENT HEMODIALYSIS CATHETER Date:  10/23/2020 10/23/2020 11:41 am Radiologist:  M. Daryll Brod, MD Guidance:  Ultrasound and fluoroscopic FLUOROSCOPY TIME:  Fluoroscopy Time: 0 minutes 18 seconds (6 mGy). MEDICATIONS: 1% lidocaine local ANESTHESIA/SEDATION: Versed 1.5 mg IV; Fentanyl 25 mcg IV; Moderate Sedation Time:  11 minutes The patient was continuously monitored during the procedure by the interventional radiology nurse under my direct supervision. CONTRAST:  None. COMPLICATIONS: None immediate. PROCEDURE: Informed consent was obtained from  the patient following explanation of the procedure, risks, benefits and alternatives. The patient understands, agrees and consents for the procedure. All questions were addressed. A time out was performed. Maximal barrier sterile technique utilized including caps, mask, sterile gowns, sterile gloves, large sterile drape, hand hygiene, and 2% chlorhexidine scrub. Under sterile conditions and local anesthesia, right internal jugular micropuncture venous access was performed with ultrasound. Images were obtained for documentation of the patent right internal jugular vein. A guide wire was inserted followed by a transitional dilator. Next, a 0.035 guidewire was advanced into the IVC with a 5-French catheter. Measurements were obtained from the right venotomy site to the proximal right atrium. In the right infraclavicular chest, a subcutaneous tunnel was created under sterile conditions and local anesthesia. 1% lidocaine with epinephrine was utilized for this. The 23 cm tip to cuff palindrome catheter was tunneled subcutaneously to the venotomy site and inserted into the SVC/RA junction through a valved peel-away sheath. Position was confirmed with fluoroscopy. Images were obtained for documentation. Blood was aspirated from the catheter followed by saline and heparin flushes. The appropriate volume and strength of heparin was instilled in each lumen. Caps were applied. The catheter was secured at the tunnel site with Gelfoam and a pursestring suture. The venotomy site was closed with subcuticular Vicryl suture. Dermabond was applied to the small right neck incision. A dry sterile dressing was applied. The catheter is ready for use. No immediate complications. IMPRESSION: Ultrasound and fluoroscopically guided right internal jugular tunneled hemodialysis catheter (23 cm tip to cuff palindrome catheter). Electronically Signed   By: Jerilynn Mages.  Shick M.D.   On: 10/23/2020 11:54   IR US Guidance  Result Date:  10/23/2020 INDICATION: Renal failure, CHF, access for permanent dialysis EXAM: ULTRASOUND GUIDANCE FOR VASCULAR ACCESS RIGHT INTERNAL JUGULAR PERMANENT HEMODIALYSIS CATHETER Date:  10/23/2020 10/23/2020 11:41 am Radiologist:  Jerilynn Mages. Daryll Brod, MD Guidance:  Ultrasound and fluoroscopic FLUOROSCOPY TIME:  Fluoroscopy Time: 0 minutes 18 seconds (6 mGy). MEDICATIONS: 1% lidocaine local ANESTHESIA/SEDATION: Versed 1.5 mg IV; Fentanyl 25 mcg IV; Moderate Sedation Time:  11 minutes The patient was continuously monitored during the procedure by the interventional radiology nurse under my direct supervision. CONTRAST:  None. COMPLICATIONS: None immediate. PROCEDURE: Informed consent was obtained from the patient following explanation of the procedure, risks, benefits and alternatives. The patient understands, agrees and consents for the procedure. All questions were addressed. A time out was performed. Maximal barrier sterile technique utilized including caps, mask, sterile gowns, sterile gloves, large sterile drape, hand hygiene, and 2% chlorhexidine scrub. Under sterile conditions and local anesthesia, right internal jugular micropuncture venous access was performed with ultrasound. Images were obtained for documentation of the patent right internal jugular vein. A guide wire was inserted followed by a transitional dilator. Next, a 0.035 guidewire was advanced into the IVC with a 5-French catheter. Measurements were obtained from the right  venotomy site to the proximal right atrium. In the right infraclavicular chest, a subcutaneous tunnel was created under sterile conditions and local anesthesia. 1% lidocaine with epinephrine was utilized for this. The 23 cm tip to cuff palindrome catheter was tunneled subcutaneously to the venotomy site and inserted into the SVC/RA junction through a valved peel-away sheath. Position was confirmed with fluoroscopy. Images were obtained for documentation. Blood was aspirated from the  catheter followed by saline and heparin flushes. The appropriate volume and strength of heparin was instilled in each lumen. Caps were applied. The catheter was secured at the tunnel site with Gelfoam and a pursestring suture. The venotomy site was closed with subcuticular Vicryl suture. Dermabond was applied to the small right neck incision. A dry sterile dressing was applied. The catheter is ready for use. No immediate complications. IMPRESSION: Ultrasound and fluoroscopically guided right internal jugular tunneled hemodialysis catheter (23 cm tip to cuff palindrome catheter). Electronically Signed   By: Jerilynn Mages.  Shick M.D.   On: 10/23/2020 11:54     Medications:     Scheduled Medications: . amiodarone  200 mg Oral BID  . atorvastatin  20 mg Oral Daily  . Chlorhexidine Gluconate Cloth  6 each Topical Q0600  . insulin aspart  0-6 Units Subcutaneous Q4H  . insulin glargine  10 Units Subcutaneous Daily  . pantoprazole  40 mg Oral Daily  . sodium chloride flush  3 mL Intravenous Q12H  . sodium chloride flush  3 mL Intravenous Q12H  . sodium chloride flush  3 mL Intravenous Q12H    Infusions: . sodium chloride    . sodium chloride    . sodium chloride 10 mL/hr at 10/24/20 0353  . heparin 1,600 Units/hr (10/24/20 0642)    PRN Medications: sodium chloride, sodium chloride, acetaminophen, acetaminophen, albuterol, dextrose, heparin sodium (porcine), hydrocortisone cream, ondansetron (ZOFRAN) IV, ondansetron (ZOFRAN) IV, sodium chloride flush, sodium chloride flush, sodium chloride flush  Assessment/Plan   1. A Flutter RVR - Started on amiodarone drip. - Failed attempt at [place] 3/18 - HR 90s-100s today, NSR with PAC's vs ectopic atrial rhythms.   Has been on IV amio will switch to oral  - TSH normal - Continue IV heparin switch to NOAC after cath  2. 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  - Echo this admit w/ severe biventricular HF with EF < 15%.   BNP > 4000 . Suspect tachy-mediated. HS Trop - no trend. No chest pain.   - PICC placed initial Co-ox 40%. Started Milrinone 0.25 converted to NSR then milrinone weaned off - Had [place] 3/24 with elevated filling pressures R>L and preserved cardiac output.  - CO-OX has remained stable off milrinone today 60% - No bb w/ low output  - No arb/dig/spiro with ESRD  - c/w unna boots.  -R/LHC scheduled for today at 0900  3. AKI on CKD Stage IIIa - Creatinine on admit 2.8.  Most recent creatinine was 1.55 back in 03/2020. - started on milrinone for low output - Kidneys didn't recover despite improved CO with milrinone and NSR, likely HD dependent, tolerating HD without milrinone support - HD cath now in place and being set up for outpatient dialysis  4. DMII - Hgb A1C 5.9  - On lantus/SSI  5.Upper/Lower Extremity Edema - Dopplers negative for DVT - 2/2 CHF Continue UF removal + unna boots   6. 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  -Continue PPI  7. HTN  -Soft.  8. Hypervolemic Hyponatremia -Restrict free water, dialysis.   Length of Stay: [place], MD  10/24/2020, 7:45 AM  Advanced Heart Failure Team Pager [phone] (M-F; 7a - 5p)   Patient seen and examined with the above-signed Advanced Practice Provider and/or Housestaff. I personally reviewed laboratory data, imaging studies and relevant notes. I independently examined the patient and formulated the important aspects of the plan. I have edited the note to reflect any of my changes or salient points. I have personally discussed the plan with the patient and/or family.  Tolerating HD well. Weight down 30 pounds. Co-ox stable off milrinone. Remains in NSR on IV amio   General: Lying in bed  No resp difficulty HEENT: normal Neck: supple. JVP to jaw Carotids 2+ bilat; no bruits. No lymphadenopathy or thryomegaly appreciated. Cor: PMI nondisplaced. Regular rate & rhythm. 2/6 TR Lungs: clear Abdomen: soft, nontender,  nondistended. No hepatosplenomegaly. No bruits or masses. Good bowel sounds. Extremities: no cyanosis, clubbing, rash, 1+ edema Neuro: alert & orientedx3, cranial nerves grossly intact. moves all 4 extremities w/o difficulty. Affect pleasant  Volume status improving with HD. Co-ox stable off milrinone. Remains in NSR on IV amio.   For R/L cath today. Can switch IV amio to po. Switch to Eliquis after cath.   Hopefully EF will improve with maintenance of NSR. Will repeat echo on Monday.   AHF team to see again on Monday. Call with questions over the weekend.   Glori Bickers, MD  9:19 AM

## 2020-10-25 DIAGNOSIS — I5021 Acute systolic (congestive) heart failure: Secondary | ICD-10-CM | POA: Diagnosis not present

## 2020-10-25 DIAGNOSIS — N179 Acute kidney failure, unspecified: Secondary | ICD-10-CM

## 2020-10-25 LAB — CBC
HCT: 30 % — ABNORMAL LOW (ref 39.0–52.0)
HCT: 31.3 % — ABNORMAL LOW (ref 39.0–52.0)
Hemoglobin: 10 g/dL — ABNORMAL LOW (ref 13.0–17.0)
Hemoglobin: 10.5 g/dL — ABNORMAL LOW (ref 13.0–17.0)
MCH: 27.5 pg (ref 26.0–34.0)
MCH: 28.2 pg (ref 26.0–34.0)
MCHC: 33.3 g/dL (ref 30.0–36.0)
MCHC: 33.5 g/dL (ref 30.0–36.0)
MCV: 82.6 fL (ref 80.0–100.0)
MCV: 83.9 fL (ref 80.0–100.0)
Platelets: 142 10*3/uL — ABNORMAL LOW (ref 150–400)
Platelets: 154 10*3/uL (ref 150–400)
RBC: 3.63 MIL/uL — ABNORMAL LOW (ref 4.22–5.81)
RBC: 3.73 MIL/uL — ABNORMAL LOW (ref 4.22–5.81)
RDW: 20.2 % — ABNORMAL HIGH (ref 11.5–15.5)
RDW: 20.4 % — ABNORMAL HIGH (ref 11.5–15.5)
WBC: 4.4 10*3/uL (ref 4.0–10.5)
WBC: 5.4 10*3/uL (ref 4.0–10.5)
nRBC: 0 % (ref 0.0–0.2)
nRBC: 0 % (ref 0.0–0.2)

## 2020-10-25 LAB — COOXEMETRY PANEL
Carboxyhemoglobin: 1.2 % (ref 0.5–1.5)
Methemoglobin: 1 % (ref 0.0–1.5)
O2 Saturation: 56.5 %
Total hemoglobin: 10.2 g/dL — ABNORMAL LOW (ref 12.0–16.0)

## 2020-10-25 LAB — GLUCOSE, CAPILLARY
Glucose-Capillary: 100 mg/dL — ABNORMAL HIGH (ref 70–99)
Glucose-Capillary: 109 mg/dL — ABNORMAL HIGH (ref 70–99)
Glucose-Capillary: 113 mg/dL — ABNORMAL HIGH (ref 70–99)
Glucose-Capillary: 116 mg/dL — ABNORMAL HIGH (ref 70–99)
Glucose-Capillary: 124 mg/dL — ABNORMAL HIGH (ref 70–99)
Glucose-Capillary: 146 mg/dL — ABNORMAL HIGH (ref 70–99)
Glucose-Capillary: 86 mg/dL (ref 70–99)

## 2020-10-25 LAB — RENAL FUNCTION PANEL
Albumin: 2.6 g/dL — ABNORMAL LOW (ref 3.5–5.0)
Anion gap: 10 (ref 5–15)
BUN: 46 mg/dL — ABNORMAL HIGH (ref 8–23)
CO2: 23 mmol/L (ref 22–32)
Calcium: 8.3 mg/dL — ABNORMAL LOW (ref 8.9–10.3)
Chloride: 100 mmol/L (ref 98–111)
Creatinine, Ser: 6.17 mg/dL — ABNORMAL HIGH (ref 0.61–1.24)
GFR, Estimated: 9 mL/min — ABNORMAL LOW (ref >60)
Glucose, Bld: 195 mg/dL — ABNORMAL HIGH (ref 70–99)
Phosphorus: 2.5 mg/dL (ref 2.5–4.6)
Potassium: 3.7 mmol/L (ref 3.5–5.1)
Sodium: 133 mmol/L — ABNORMAL LOW (ref 135–145)

## 2020-10-25 NOTE — Progress Notes (Signed)
[facility] Progress Note    Assessment/ Plan:   Assessment/Recommendations: Kyle Hicks is a/an 80 y.o. 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 Fib/A. Flutter, CKD Stage 3a, T2DM, HTN, obesity.  1. AKI: Likely a combination of hypoperfusion secondary to tachycardia and vascular congestion secondary to cardiorenal syndrome in setting of new EF 10-15%.  -s/p temp HD line 3/24, had UF 3/25 and 3/26 for diuretic refractory volume overload; [facility] 10/23/20 -Making some urine, would d/c on torsemide 100 daily to help maintain euvolemia  - Will plan for ongoing RRT while monitoring for renal recovery - HD today with UFG 3L  -CLIP AKI - baseline wasn't terrible and if his heart recovers function perhaps we will see improved renal function.  Has outpt HD arranged at [facility] TTS to start 10/28/20, see details in note from Terri Piedra; pt aware -Continue to monitor for improvement in renal function and avoid further insults by avoiding nephrotoxins, avoiding hypotension. Cont to hold RAAS inhibition fo now. -hold on perm acces given hopeful for recovery  -Monitor Daily I/Os, Daily weight   2. 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  UACR initially normal in 2018; markedly elevated in 2021 at 612. Additional differential includes obesity related GN.  Baseline creatinine was previously 1.2 - 1.6.   3. Acute Biventricular Heart Failure: Previous hx of HFpEF with progression to HFrEF with EF of 10-15%; suspected to be in the setting of A. Fib with RVR.  -HF following -- off milrinone now and cardiology hopeful with A fib therapy his EF will rebound in the coming weeks/months.  -LHC unrevealing -volume management per above  4. Hyponatremia (hypervolemic) -improved with UF, currently mildly hyponatremic; cont fluid restriction/na  restriction, UF  5. A. Fib with RVR: DCCV on 3/18. In NSR now. Currently on Amiodarone. Per cardiology.   6. Type 2 Diabetes Mellitus: -per primary  7. Hx of HTN: Previously on HCTZ, Nifedipine and Ramipril. Currently all three are being held. BP has been soft during this admission. 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 At that time, patient refused further evaluation. LFTs WNL on admission.  Defer to primary  [facility] for d/c from nephrology perspective at this time after HD today. Call with issues.  [name] [facility] Pager [phone]   Subjective:   UOP yesterday 243m.  He feels fine.  No new complaints.  S/p LHC yesterday - no interventions or significant pathology noted.   Objective:   BP 115/71 (BP Location: Left Arm)   Pulse 71   Temp 98.5 F (36.9 C) (Oral)   Resp 17   Ht '5\' 6"'$  (1.676 m)   Wt 106.3 kg   SpO2 97%   BMI 37.82 kg/m   Intake/Output Summary (Last 24 hours) at 10/25/2020 0941 Last data filed at 10/25/2020 0425 Gross per 24 hour  Intake 240 ml  Output 250 ml  Net -10 ml   Weight change: 0 kg  Physical Exam: [id number] gentleman, sitting up in bed CVS:RRR. No murmurs Resp:No wheezing or rales. No respiratory distress.  Abd: obese, soft, nt  Ext: 1+ pitting edema of 4 extr Neuro: awake, interactive, speech clear and coherent [facility]  Imaging: CARDIAC CATHETERIZATION  Result Date: 10/24/2020  Mid LAD  lesion is 40% stenosed.  Findings: Ao = 89/52 (68) RA = 10 RV = 45/12 PA = 47/17 (28) PCW = 9 Fick cardiac output/index = 6.6/3.1 PVR =2.9 FA sat = 98% PA sat = 65%, 67% Assessment: 1. 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 RCA not injected 2. Mild PAH 3. 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 Plan/Discussion: Continue medical therapy. Consider CT imaging of thoracic aorta due to  resistance passing catheters in proximal thoracic aorta. Glori Bickers, MD 11:23 AM   IR Fluoro Guide CV Line Right  Result Date: 10/23/2020 INDICATION: Renal failure, CHF, access for permanent dialysis EXAM: ULTRASOUND GUIDANCE FOR VASCULAR ACCESS RIGHT INTERNAL JUGULAR PERMANENT HEMODIALYSIS CATHETER Date:  10/23/2020 10/23/2020 11:41 am Radiologist:  M. Daryll Brod, MD Guidance:  Ultrasound and fluoroscopic FLUOROSCOPY TIME:  Fluoroscopy Time: 0 minutes 18 seconds (6 mGy). MEDICATIONS: 1% lidocaine local ANESTHESIA/SEDATION: Versed 1.5 mg IV; Fentanyl 25 mcg IV; Moderate Sedation Time:  11 minutes The patient was continuously monitored during the procedure by the interventional radiology nurse under my direct supervision. CONTRAST:  None. COMPLICATIONS: None immediate. PROCEDURE: Informed consent was obtained from the patient following explanation of the procedure, risks, benefits and alternatives. The patient understands, agrees and consents for the procedure. All questions were addressed. A time out was performed. Maximal barrier sterile technique utilized including caps, mask, sterile gowns, sterile gloves, large sterile drape, hand hygiene, and 2% chlorhexidine scrub. Under sterile conditions and local anesthesia, right internal jugular micropuncture venous access was performed with ultrasound. Images were obtained for documentation of the patent right internal jugular vein. A guide wire was inserted followed by a transitional dilator. Next, a 0.035 guidewire was advanced into the IVC with a 5-French catheter. Measurements were obtained from the right venotomy site to the proximal right atrium. In the right infraclavicular chest, a subcutaneous tunnel was created under sterile conditions and local anesthesia. 1% lidocaine with epinephrine was utilized for this. The 23 cm tip to cuff palindrome catheter was tunneled subcutaneously to the venotomy site and inserted into the SVC/RA junction through a  valved peel-away sheath. Position was confirmed with fluoroscopy. Images were obtained for documentation. Blood was aspirated from the catheter followed by saline and heparin flushes. The appropriate volume and strength of heparin was instilled in each lumen. Caps were applied. The catheter was secured at the tunnel site with Gelfoam and a pursestring suture. The venotomy site was closed with subcuticular Vicryl suture. Dermabond was applied to the small right neck incision. A dry sterile dressing was applied. The catheter is ready for use. No immediate complications. IMPRESSION: Ultrasound and fluoroscopically guided right internal jugular tunneled hemodialysis catheter (23 cm tip to cuff palindrome catheter). Electronically Signed   By: Jerilynn Mages.  Shick M.D.   On: 10/23/2020 11:54   IR US Guidance  Result Date: 10/23/2020 INDICATION: Renal failure, CHF, access for permanent dialysis EXAM: ULTRASOUND GUIDANCE FOR VASCULAR ACCESS RIGHT INTERNAL JUGULAR PERMANENT HEMODIALYSIS CATHETER Date:  10/23/2020 10/23/2020 11:41 am Radiologist:  Jerilynn Mages. Daryll Brod, MD Guidance:  Ultrasound and fluoroscopic FLUOROSCOPY TIME:  Fluoroscopy Time: 0 minutes 18 seconds (6 mGy). MEDICATIONS: 1% lidocaine local ANESTHESIA/SEDATION: Versed 1.5 mg IV; Fentanyl 25 mcg IV; Moderate Sedation Time:  11 minutes The patient was continuously monitored during the procedure by the interventional radiology nurse under my direct supervision. CONTRAST:  None. COMPLICATIONS: None immediate. PROCEDURE: Informed consent was obtained from the patient following explanation of the procedure, risks, benefits and alternatives. The patient understands, agrees and  consents for the procedure. All questions were addressed. A time out was performed. Maximal barrier sterile technique utilized including caps, mask, sterile gowns, sterile gloves, large sterile drape, hand hygiene, and 2% chlorhexidine scrub. Under sterile conditions and local anesthesia, right internal  jugular micropuncture venous access was performed with ultrasound. Images were obtained for documentation of the patent right internal jugular vein. A guide wire was inserted followed by a transitional dilator. Next, a 0.035 guidewire was advanced into the IVC with a 5-French catheter. Measurements were obtained from the right venotomy site to the proximal right atrium. In the right infraclavicular chest, a subcutaneous tunnel was created under sterile conditions and local anesthesia. 1% lidocaine with epinephrine was utilized for this. The 23 cm tip to cuff palindrome catheter was tunneled subcutaneously to the venotomy site and inserted into the SVC/RA junction through a valved peel-away sheath. Position was confirmed with fluoroscopy. Images were obtained for documentation. Blood was aspirated from the catheter followed by saline and heparin flushes. The appropriate volume and strength of heparin was instilled in each lumen. Caps were applied. The catheter was secured at the tunnel site with Gelfoam and a pursestring suture. The venotomy site was closed with subcuticular Vicryl suture. Dermabond was applied to the small right neck incision. A dry sterile dressing was applied. The catheter is ready for use. No immediate complications. IMPRESSION: Ultrasound and fluoroscopically guided right internal jugular tunneled hemodialysis catheter (23 cm tip to cuff palindrome catheter). Electronically Signed   By: Jerilynn Mages.  Shick M.D.   On: 10/23/2020 11:54   Labs: BMET Recent Labs  Lab 10/19/20 0500 10/20/20 0309 10/21/20 0419 10/22/20 0412 10/23/20 0500 10/24/20 0501 10/24/20 1016 10/24/20 1024  NA 132* 131* 130* 129* 133* 133* 137 133*  133*  K 3.5 3.8 3.7 3.5 3.7 3.6 3.4* 3.9  3.9  CL 98 99 97* 95* 99 100  --   --   CO2 '23 23 23 22 25 23  '$ --   --   GLUCOSE 112* 105* 95 96 81 81  --   --   BUN 38* 41* 45* 50* 35* 39*  --   --   CREATININE 3.52* 4.29* 4.80* 5.45* 4.40* 5.25*  --   --   CALCIUM 8.0* 7.9*  7.9* 8.1* 8.2* 8.0*  --   --    CBC Recent Labs  Lab 10/22/20 0412 10/23/20 0500 10/24/20 0501 10/24/20 1016 10/24/20 1024 10/25/20 0451  WBC 5.3 5.2 5.6  --   --  4.4  HGB 9.9* 10.1* 9.9* 10.2* 10.9*  11.2* 10.0*  HCT 28.8* 30.1* 29.7* 30.0* 32.0*  33.0* 30.0*  MCV 82.1 82.9 83.7  --   --  82.6  PLT 141* 135* 136*  --   --  142*    Medications:    . amiodarone  200 mg Oral BID  . apixaban  5 mg Oral BID  . atorvastatin  20 mg Oral Daily  . Chlorhexidine Gluconate Cloth  6 each Topical Q0600  . insulin aspart  0-6 Units Subcutaneous Q4H  . insulin glargine  10 Units Subcutaneous Daily  . pantoprazole  40 mg Oral Daily  . sodium chloride flush  3 mL Intravenous Q12H  . sodium chloride flush  3 mL Intravenous Q12H  . sodium chloride flush  3 mL Intravenous Q12H  . sodium chloride flush  3 mL Intravenous Q12H   10/25/2020, 9:41 AM

## 2020-10-25 NOTE — Progress Notes (Signed)
During report to HD nurse asked if they could assess if the tunneled cath was sutured as I could not assess if there was a suture under the biopatch. During return handoff, HD nurse did note that she had changed the dressing and that it did not have a suture in place. Rounding nephrology made aware in the AM of concern and it was noted that IR will need to place a suture for patient prior to discharge.

## 2020-10-25 NOTE — Progress Notes (Signed)
PROGRESS NOTE    Kyle Hicks  [id number] DOB: 07-01-1941 DOA: 10/07/2020 PCP: Sharilyn Sites, MD    Brief Narrative:  Kyle Hicks admitted to the hospitalwith theworking diagnosis of acute on chronic systolic heart failure decompensation, complicated with pulmonary HTN class 2, acute core pulmonale, biventricular failure and worsening renal failure. Now on hemodialysis  80 year old male past medical history for hypertension, heart failure, type 2 diabetes mellitus and history of GI bleed who presented after mechanical fall. On the day of admission he got up to use the restroom, felt very week and sustained a mechanical fall. No head trauma or loss of consciousness. Reported lower extremity edema, dyspnea, wheezing and palpitations. Positive diarrhea. On his initial physical examination heart rate 129-134, respiratory rate 19-43, blood pressure 105/73-130 /108, oxygen saturation 98% on supplemental oxygen. His lungs had no wheezing or rales, heart S1-S2, present, rhythmic, tachycardic, abdomen soft, positive lower extremity edema.  Chest radiograph with cardiomegaly, positive hilar vascular congestion, small right pleural effusion.  EKG 133 bpm, left axis deviation, Q waves lead II, lead III, aVF, prolonged QTc545, right bundle branch block,atrial flutter, no ST segment changes, negative T wave V1-V3.  Patientsuspected low output heart failure andstarted on milrinone infusion along with furosemide drip. Amiodarone drip for rate control atrial flutter.  Right heart cath with PCW 22 with PA mean 33 and cardiac output 6.0 per Fick and 6.9 per thermodilution. (on milrinone 0.25 mcg/kg/hr) Right HD IJ HD cathter placed for further fluid removal per ultrafiltration.  Underwent ultrafiltrationon03/25 (3,000 ml)and on 03/26 (2,800 ml)with good toleration  Patient successfully weaned of furosemide drip and milrinone, improved heart rate with amiodarone.   Unfortunately  renal function not recovering, likely will need long term hemodialysis.   10/24/2020: Patient underwent cardiac catheterization today no significant obstruction was found.  Patient remains volume overloaded.  Assessment & Plan:   Principal Problem:   Acute CHF (congestive heart failure) (HCC) Active Problems:   Acute kidney injury superimposed on chronic kidney disease (HCC)   Essential hypertension   Elevated d-dimer   Obesity (BMI 30-39.9)   Diarrhea   Atrial flutter (HCC)   Atrial fibrillation (HCC)   AKI (acute kidney injury) ([place])   1. Acute on chronic systolic heart failure/ biventricular failure/ acute core pulmonale/ precapillary pulmonary hypertension, class 2.  LV EF 10 to 15%, global hypokinesis, SV 41 RV systolic severely reduced, RVSP 49,1 mmHg.  Bi-atrial enlargement, severe TR.  Right heart catheterization03/24: PA mean 33 PCW 22 PVR 1.8 Cardiac output 6.0 (Fick) 6,9 (thermodilution) on milrinone 0,25 mcg/kg/hr. . Improved volume status, patient now on sinus rhythm, oxygenating 94% on room air.  Continue rate control with amiodarone, holding RAAS inhibition due to unstable GFR. Further volume control with HD.  Plan for left heart cath in am, to rule out ischemic heart disease. 10/25/2020: Volume is being controlled by hemodialysis.  Outpatient hemodialysis.  Cardiology and nephrology input is highly appreciated.  2. Atrial flutter with RVR/ prolonged QTc. Sp unsuccessful cardioversion 03/18.  Continue to maintain sinus rhythm, heart rate has further improved down to the 70's. Anticoagulation with IV heparin, transition to oral after coronary angiography. 10/25/2020: Currently on amiodarone.  3. 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  Continue with no renal recovery, serum Na is 133, K 3,7 and bicarbonate at 25. Cr 4,40  Today had tunneled HD catheter at the right IJ. Continue renal  replacement therapy per nephrology recommendations. Will need outpatient HD facility. 10/25/2020: Patient had  another contrast dye exposure during cardiac catheterization yesterday.  For hemodialysis today.  Patient will likely continue hemodialysis on discharge TTS.  4. T2DM with dyslipidemiaContinue glucose control with basal and sliding scale. Patient is tolerating po well, fasting glucose is 81 this am  Continue withatorvastatin 10/25/2020: Continue sliding scale insulin coverage.  5. Obesity class 3. Calculated BMI is 40,0  6. HTN. Stable blood pressure off milrinone.    Patient continue to be at high risk for worsening renal and heart failure   Status is: Inpatient  Remains inpatient appropriate because:IV treatments appropriate due to intensity of illness or inability to take PO   Dispo: The patient is from: Home              Anticipated d/c is to: Home              Patient currently is not medically stable to d/c.   Difficult to place patient No   DVT prophylaxis: Heparin   Code Status:   full  Family Communication:  No family at the bedside     Consultants:   Nephrology   cardiology   Procedures:   Right IJ HD cathter, (temporary and tunneled)       Subjective: -No new complaints. -No chest pain. -No fever or chills. -Shortness of breath persist.  Objective: Vitals:   10/25/20 1530 10/25/20 1600 10/25/20 1630 10/25/20 1700  BP: 112/77 117/62 113/62 (!) 141/63  Pulse: 68 70 69 70  Resp:      Temp:      TempSrc:      SpO2:      Weight:      Height:        Intake/Output Summary (Last 24 hours) at 10/25/2020 1729 Last data filed at 10/25/2020 1708 Gross per 24 hour  Intake 480 ml  Output 2592 ml  Net -2112 ml   Filed Weights   10/25/20 0012 10/25/20 0446 10/25/20 1400  Weight: 106.3 kg 106.3 kg 106.8 kg    Examination:   General: Patient is obese.  Volume overloaded.  Deconditioned.   Neurology: somnolent post procedure, but easy to  arouse,  E ENT: no pallor, no icterus, oral mucosa moist Cardiovascular: No JVD. S1-S2 present, rhythmic, no gallops, rubs, or murmurs. ++ lower extremity edema. Unna boots in place.  Pulmonary: positive breath sounds bilaterally, no wheezing Gastrointestinal. Abdomen protuberant, non tender Skin. No rashes Musculoskeletal: no joint deformities     Data Reviewed: I have personally reviewed following labs and imaging studies  CBC: Recent Labs  Lab 10/22/20 0412 10/23/20 0500 10/24/20 0501 10/24/20 1016 10/24/20 1024 10/25/20 0451 10/25/20 1343  WBC 5.3 5.2 5.6  --   --  4.4 5.4  HGB 9.9* 10.1* 9.9* 10.2* 10.9*  11.2* 10.0* 10.5*  HCT 28.8* 30.1* 29.7* 30.0* 32.0*  33.0* 30.0* 31.3*  MCV 82.1 82.9 83.7  --   --  82.6 83.9  PLT 141* 135* 136*  --   --  142* [id number]   Basic Metabolic Panel: Recent Labs  Lab 10/21/20 0419 10/22/20 0412 10/23/20 0500 10/24/20 0501 10/24/20 1016 10/24/20 1024 10/25/20 1342  NA 130* 129* 133* 133* 137 133*  133* 133*  K 3.7 3.5 3.7 3.6 3.4* 3.9  3.9 3.7  CL 97* 95* 99 100  --   --  100  CO2 '23 22 25 23  '$ --   --  23  GLUCOSE 95 96 81 81  --   --  195*  BUN 45*  50* 35* 39*  --   --  46*  CREATININE 4.80* 5.45* 4.40* 5.25*  --   --  6.17*  CALCIUM 7.9* 8.1* 8.2* 8.0*  --   --  8.3*  MG 1.5* 1.7 2.0  --   --   --   --   PHOS  --   --   --   --   --   --  2.5   GFR: Estimated Creatinine Clearance: 11.5 mL/min (A) (by C-G formula based on SCr of 6.17 mg/dL (H)). Liver Function Tests: Recent Labs  Lab 10/25/20 1342  ALBUMIN 2.6*   No results for input(s): LIPASE, AMYLASE in the last 168 hours. No results for input(s): AMMONIA in the last 168 hours. Coagulation Profile: Recent Labs  Lab 10/24/20 0501  INR 1.4*   Cardiac Enzymes: No results for input(s): CKTOTAL, CKMB, CKMBINDEX, TROPONINI in the last 168 hours. BNP (last 3 results) No results for input(s): PROBNP in the last 8760 hours. HbA1C: No results for input(s): HGBA1C in  the last 72 hours. CBG: Recent Labs  Lab 10/24/20 2015 10/25/20 0008 10/25/20 0422 10/25/20 0811 10/25/20 1213  GLUCAP 162* 113* 100* 109* 116*   Lipid Profile: No results for input(s): CHOL, HDL, LDLCALC, TRIG, CHOLHDL, LDLDIRECT in the last 72 hours. Thyroid Function Tests: No results for input(s): TSH, T4TOTAL, FREET4, T3FREE, THYROIDAB in the last 72 hours. Anemia Panel: No results for input(s): VITAMINB12, FOLATE, FERRITIN, TIBC, IRON, RETICCTPCT in the last 72 hours.    Radiology Studies: I have reviewed all of the imaging during this hospital visit personally     Scheduled Meds: . amiodarone  200 mg Oral BID  . apixaban  5 mg Oral BID  . atorvastatin  20 mg Oral Daily  . Chlorhexidine Gluconate Cloth  6 each Topical Q0600  . insulin aspart  0-6 Units Subcutaneous Q4H  . insulin glargine  10 Units Subcutaneous Daily  . pantoprazole  40 mg Oral Daily  . sodium chloride flush  3 mL Intravenous Q12H  . sodium chloride flush  3 mL Intravenous Q12H  . sodium chloride flush  3 mL Intravenous Q12H  . sodium chloride flush  3 mL Intravenous Q12H   Continuous Infusions: . sodium chloride    . sodium chloride       LOS: 18 days        Bonnell Public, MD

## 2020-10-26 DIAGNOSIS — I5021 Acute systolic (congestive) heart failure: Secondary | ICD-10-CM | POA: Diagnosis not present

## 2020-10-26 LAB — GLUCOSE, CAPILLARY
Glucose-Capillary: 84 mg/dL (ref 70–99)
Glucose-Capillary: 97 mg/dL (ref 70–99)
Glucose-Capillary: 99 mg/dL (ref 70–99)
Glucose-Capillary: 157 mg/dL — ABNORMAL HIGH (ref 70–99)
Glucose-Capillary: 137 mg/dL — ABNORMAL HIGH (ref 70–99)

## 2020-10-26 LAB — COOXEMETRY PANEL
Carboxyhemoglobin: 1.3 % (ref 0.5–1.5)
Methemoglobin: 0.8 % (ref 0.0–1.5)
O2 Saturation: 64.3 %
Total hemoglobin: 9.8 g/dL — ABNORMAL LOW (ref 12.0–16.0)

## 2020-10-26 MED ORDER — LIP MEDEX EX OINT
TOPICAL_OINTMENT | CUTANEOUS | Status: DC | PRN
  Filled 2020-10-26: qty 7

## 2020-10-26 MED ORDER — CALCIUM CARBONATE ANTACID 500 MG PO CHEW
1.0000 | CHEWABLE_TABLET | Freq: Two times a day (BID) | ORAL | Status: DC | PRN
  Filled 2020-10-26: qty 1

## 2020-10-26 NOTE — Plan of Care (Signed)
  Problem: Respiratory: Goal: Respiratory symptoms related to disease process will be avoided Outcome: Completed/Met   Problem: Elimination: Goal: Will not experience complications related to bowel motility Outcome: Completed/Met   Problem: Nutrition: Goal: Adequate nutrition will be maintained Outcome: Completed/Met

## 2020-10-26 NOTE — Progress Notes (Signed)
IR requested to assess HD catheter for presence of suture prior to patient discharge. Patient assessed at the bedside. The tegaderm was partially retracted just enough to see under the biopatch. Suture is present but is very close to the skin. Tegaderm placed back over the site.     No further action required by IR. Please call IR with any questions.  Soyla Dryer, [place] [phone] 10/26/2020, 12:35 PM

## 2020-10-26 NOTE — Progress Notes (Signed)
Kyle Hicks Progress Note    Assessment/ Plan:   Assessment/Recommendations: Kyle Hicks is a/an Hicks y.o. 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 Fib/A. Flutter, CKD Stage 3a, T2DM, HTN, obesity.  1. AKI: Likely a combination of hypoperfusion secondary to tachycardia and vascular congestion secondary to cardiorenal syndrome in setting of new EF 10-15%.  -s/p temp HD line 3/24, had UF 3/25 and 3/26 for diuretic refractory volume overload; [facility] 10/23/20 -Making some urine, would d/c on torsemide 100 daily to help maintain euvolemia  - Will plan for ongoing RRT while monitoring for renal recovery - HD last 4/2, next planned 4/5 hopefully outpt.  -CLIP AKI - baseline wasn't terrible and if his heart recovers function perhaps we will see improved renal function.  Has outpt HD arranged at [facility] TTS to start 10/28/20, see details in note from Kyle Hicks; pt aware -Contacted IR to place anchor stitch for HD cath - RN let me know it didn't have one. -Continue to monitor for improvement in renal function and avoid further insults by avoiding nephrotoxins, avoiding hypotension. Cont to hold RAAS inhibition fo now. -hold on perm acces given hopeful for recovery  -Monitor Daily I/Os, Daily weight   2. 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  UACR initially normal in 2018; markedly elevated in 2021 at 612. Additional differential includes obesity related GN.  Baseline creatinine was previously 1.2 - 1.6.   3. Acute Biventricular Heart Failure: Previous hx of HFpEF with progression to HFrEF with EF of 10-15%; suspected to be in the setting of A. Fib with RVR.  -HF following -- off milrinone now and cardiology hopeful with A fib therapy his EF will rebound in the coming weeks/months.  -LHC unrevealing -volume management per  above  4. Hyponatremia (hypervolemic) -improved with UF, currently mildly hyponatremic; cont fluid restriction/na restriction, UF  5. A. Fib with RVR: DCCV on 3/18. In NSR now. Currently on Amiodarone. Per cardiology.   6. Type 2 Diabetes Mellitus: -per primary  7. Hx of HTN: Previously on HCTZ, Nifedipine and Ramipril. Currently all three are being held. BP has been soft during this admission. 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 At that time, patient refused further evaluation. LFTs WNL on admission.  Defer to primary  [facility] for d/c from nephrology perspective at this time after HD today. Call with issues.  Kyle Hick MD [facility] Pager [phone]   Subjective:   UOP yesterday 34m.  He feels fine.  No new complaints.  Tolerated HD fine yesterday.   Objective:   BP 114/62   Pulse Hicks   Temp 98.6 F (37 C) (Oral)   Resp 19   Ht '5\' 6"'$  (1.676 m)   Wt 103.7 kg   SpO2 94%   BMI 36.90 kg/m   Intake/Output Summary (Last 24 hours) at 10/26/2020 0958 Last data filed at 10/26/2020 [id number] per 24 hour  Intake 200 ml  Output 2542 ml  Net -2342 ml   Weight change: 0.5 kg  Physical Exam: GBJ:[id number] gentleman, sitting up in bed CVS:RRR. No murmurs Resp:No wheezing or rales. No respiratory distress.  Abd: obese, soft, nt  Ext: 1+ pitting edema of 4 extr Neuro: awake, interactive, speech clear and coherent Access:  RIJ TDC c/d/i  Imaging: CARDIAC CATHETERIZATION  Result Date: 10/24/2020  Mid LAD lesion is 40% stenosed.  Findings: Ao = 89/52 (68) RA = 10 RV = 45/12 PA = 47/17 (28) PCW = 9 Fick cardiac output/index = 6.6/3.1 PVR =2.9 FA sat = 98% PA sat = 65%, 67% Assessment: 1. 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 RCA not injected 2. Mild PAH 3. 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 Plan/Discussion: Continue medical  therapy. Consider CT imaging of thoracic aorta due to resistance passing catheters in proximal thoracic aorta. Kyle Bickers, MD 11:23 AM   Labs: BMET Recent Labs  Lab 10/20/20 0309 10/21/20 0419 10/22/20 0412 10/23/20 0500 10/24/20 0501 10/24/20 1016 10/24/20 1024 10/25/20 1342  NA 131* 130* 129* 133* 133* 137 133*  133* 133*  K 3.8 3.7 3.5 3.7 3.6 3.4* 3.9  3.9 3.7  CL 99 97* 95* 99 100  --   --  100  CO2 '23 23 22 25 23  '$ --   --  23  GLUCOSE 105* 95 96 81 81  --   --  195*  BUN 41* 45* 50* 35* 39*  --   --  46*  CREATININE 4.29* 4.80* 5.45* 4.40* 5.25*  --   --  6.17*  CALCIUM 7.9* 7.9* 8.1* 8.2* 8.0*  --   --  8.3*  PHOS  --   --   --   --   --   --   --  2.5   CBC Recent Labs  Lab 10/23/20 0500 10/24/20 0501 10/24/20 1016 10/24/20 1024 10/25/20 0451 10/25/20 1343  WBC 5.2 5.6  --   --  4.4 5.4  HGB 10.1* 9.9* 10.2* 10.9*  11.2* 10.0* 10.5*  HCT 30.1* 29.7* 30.0* 32.0*  33.0* 30.0* 31.3*  MCV 82.9 83.7  --   --  82.6 83.9  PLT 135* 136*  --   --  142* 154    Medications:    . amiodarone  200 mg Oral BID  . apixaban  5 mg Oral BID  . atorvastatin  20 mg Oral Daily  . Chlorhexidine Gluconate Cloth  6 each Topical Q0600  . insulin aspart  0-6 Units Subcutaneous Q4H  . insulin glargine  10 Units Subcutaneous Daily  . pantoprazole  40 mg Oral Daily  . sodium chloride flush  3 mL Intravenous Q12H  . sodium chloride flush  3 mL Intravenous Q12H  . sodium chloride flush  3 mL Intravenous Q12H  . sodium chloride flush  3 mL Intravenous Q12H   10/26/2020, 9:58 AM

## 2020-10-26 NOTE — Progress Notes (Signed)
PROGRESS NOTE    Kyle Hicks  [id number] DOB: 05/23/1941 DOA: 10/07/2020 PCP: Sharilyn Sites, MD    Brief Narrative:  Mr. Bovee admitted to the hospitalwith theworking diagnosis of acute on chronic systolic heart failure decompensation, complicated with pulmonary HTN class 2, acute core pulmonale, biventricular failure and worsening renal failure. Now on hemodialysis  80 year old male past medical history for hypertension, heart failure, type 2 diabetes mellitus and history of GI bleed who presented after mechanical fall. On the day of admission he got up to use the restroom, felt very week and sustained a mechanical fall. No head trauma or loss of consciousness. Reported lower extremity edema, dyspnea, wheezing and palpitations. Positive diarrhea. On his initial physical examination heart rate 129-134, respiratory rate 19-43, blood pressure 105/73-130 /108, oxygen saturation 98% on supplemental oxygen. His lungs had no wheezing or rales, heart S1-S2, present, rhythmic, tachycardic, abdomen soft, positive lower extremity edema.  Chest radiograph with cardiomegaly, positive hilar vascular congestion, small right pleural effusion.  EKG 133 bpm, left axis deviation, Q waves lead II, lead III, aVF, prolonged QTc545, right bundle branch block,atrial flutter, no ST segment changes, negative T wave V1-V3.  Patientsuspected low output heart failure andstarted on milrinone infusion along with furosemide drip. Amiodarone drip for rate control atrial flutter.  Right heart cath with PCW 22 with PA mean 33 and cardiac output 6.0 per Fick and 6.9 per thermodilution. (on milrinone 0.25 mcg/kg/hr) Right HD IJ HD cathter placed for further fluid removal per ultrafiltration.  Underwent ultrafiltrationon03/25 (3,000 ml)and on 03/26 (2,800 ml)with good toleration  Patient successfully weaned of furosemide drip and milrinone, improved heart rate with amiodarone.   Unfortunately  renal function not recovering, likely will need long term hemodialysis.   10/24/2020: Patient underwent cardiac catheterization today no significant obstruction was found.  Patient remains volume overloaded. 10/26/2020: Patient seen.  Patient underwent hemodialysis yesterday with over 2 L ultrafiltration.  Suspect patient's urine output is decreasing.  Outpatient hemodialysis chair arranged for TTS.  Patient will be discharged once cleared by the CHF team.  Assessment & Plan:   Principal Problem:   Acute CHF (congestive heart failure) ([place]) Active Problems:   Acute kidney injury superimposed on chronic kidney disease ([place])   Essential hypertension   Elevated d-dimer   Obesity (BMI 30-39.9)   Diarrhea   Atrial flutter (H[facility])   Atrial fibrillation (HCC)   AKI (acute kidney injury) ([place])   1. Acute on chronic systolic heart failure/ biventricular failure/ acute core pulmonale/ precapillary pulmonary hypertension, class 2.  LV EF 10 to 15%, global hypokinesis, SV 41 RV systolic severely reduced, RVSP 49,1 mmHg.  Bi-atrial enlargement, severe TR.  Right heart catheterization03/24: PA mean 33 PCW 22 PVR 1.8 Cardiac output 6.0 (Fick) 6,9 (thermodilution) on milrinone 0,25 mcg/kg/hr. . Improved volume status, patient now on sinus rhythm, oxygenating 94% on room air.  Continue rate control with amiodarone, holding RAAS inhibition due to unstable GFR. Further volume control with HD.  Plan for left heart cath in am, to rule out ischemic heart disease. 10/25/2020: Volume is being controlled by hemodialysis.  Outpatient hemodialysis.  Cardiology and nephrology input is highly appreciated. 10/26/20: Continue hemodialysis on discharge TTS.  2. Atrial flutter with RVR/ prolonged QTc. Sp unsuccessful cardioversion 03/18.  Continue to maintain sinus rhythm, heart rate has further improved down to the 70's. Anticoagulation with IV heparin, transition to oral after coronary  angiography. 10/25/2020: Currently on amiodarone.  3. 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  Continue with no renal recovery, serum Na is 133, K 3,7 and bicarbonate at 25. Cr 4,40  Today had tunneled HD catheter at the right IJ. Continue renal replacement therapy per nephrology recommendations. Will need outpatient HD facility. 10/25/2020: Patient had another contrast dye exposure during cardiac catheterization yesterday.  For hemodialysis today.  Patient will likely continue hemodialysis on discharge TTS.  10/26/2020: Guarded prognosis.  I suspect renal recovery will be quite challenging.  4. T2DM with dyslipidemiaContinue glucose control with basal and sliding scale. Patient is tolerating po well, fasting glucose is 81 this am  Continue withatorvastatin 10/25/2020: Continue sliding scale insulin coverage.  5. Obesity class 3. Calculated BMI is 40,0  6. HTN. Stable blood pressure off milrinone.    Patient continue to be at high risk for worsening renal and heart failure   Status is: Inpatient  Remains inpatient appropriate because:IV treatments appropriate due to intensity of illness or inability to take PO   Dispo: The patient is from: Home              Anticipated d/c is to: Home              Patient currently is not medically stable to d/c.   Difficult to place patient No   DVT prophylaxis: Heparin   Code Status:   full  Family Communication:  No family at the bedside     Consultants:   Nephrology   cardiology   Procedures:   Right IJ HD cathter, (temporary and tunneled)       Subjective: -No new complaints. -No chest pain. -No fever or chills. -Shortness of breath persist.  Objective: Vitals:   10/25/20 2352 10/26/20 0400 10/26/20 0725 10/26/20 0800  BP: 96/60 100/65  114/62  Pulse:  70  78  Resp: 18 19    Temp: 98.7 F (37.1 C) 98.8 F (37.1 C) 98.6 F (37 C)   TempSrc:  Oral Oral Oral   SpO2:  97%  94%  Weight:  103.7 kg    Height:        Intake/Output Summary (Last 24 hours) at 10/26/2020 1453 Last data filed at 10/26/2020 1249 Gross per 24 hour  Intake 320 ml  Output 2542 ml  Net -2222 ml   Filed Weights   10/25/20 1400 10/25/20 1715 10/26/20 0400  Weight: 106.8 kg 104.1 kg 103.7 kg    Examination:   General: Patient is obese.  Volume overloaded.  Deconditioned.   Neurology: somnolent post procedure, but easy to arouse,  E ENT: no pallor, no icterus, oral mucosa moist Cardiovascular: No JVD. S1-S2 present, rhythmic, no gallops, rubs, or murmurs. ++ lower extremity edema. Unna boots in place.  Pulmonary: positive breath sounds bilaterally, no wheezing Gastrointestinal. Abdomen protuberant, non tender Skin. No rashes Musculoskeletal: no joint deformities     Data Reviewed: I have personally reviewed following labs and imaging studies  CBC: Recent Labs  Lab 10/22/20 0412 10/23/20 0500 10/24/20 0501 10/24/20 1016 10/24/20 1024 10/25/20 0451 10/25/20 1343  WBC 5.3 5.2 5.6  --   --  4.4 5.4  HGB 9.9* 10.1* 9.9* 10.2* 10.9*  11.2* 10.0* 10.5*  HCT 28.8* 30.1* 29.7* 30.0* 32.0*  33.0* 30.0* 31.3*  MCV 82.1 82.9 83.7  --   --  82.6 83.9  PLT 141* 135* 136*  --   --  142* [id number]   Basic Metabolic Panel: Recent Labs  Lab 10/21/20 0419 10/22/20 0412 10/23/20 0500 10/24/20 0501 10/24/20 1016 10/24/20 1024  10/25/20 1342  NA 130* 129* 133* 133* 137 133*  133* 133*  K 3.7 3.5 3.7 3.6 3.4* 3.9  3.9 3.7  CL 97* 95* 99 100  --   --  100  CO2 '23 22 25 23  '$ --   --  23  GLUCOSE 95 96 81 81  --   --  195*  BUN 45* 50* 35* 39*  --   --  46*  CREATININE 4.80* 5.45* 4.40* 5.25*  --   --  6.17*  CALCIUM 7.9* 8.1* 8.2* 8.0*  --   --  8.3*  MG 1.5* 1.7 2.0  --   --   --   --   PHOS  --   --   --   --   --   --  2.5   GFR: Estimated Creatinine Clearance: 11.3 mL/min (A) (by C-G formula based on SCr of 6.17 mg/dL (H)). Liver Function  Tests: Recent Labs  Lab 10/25/20 1342  ALBUMIN 2.6*   No results for input(s): LIPASE, AMYLASE in the last 168 hours. No results for input(s): AMMONIA in the last 168 hours. Coagulation Profile: Recent Labs  Lab 10/24/20 0501  INR 1.4*   Cardiac Enzymes: No results for input(s): CKTOTAL, CKMB, CKMBINDEX, TROPONINI in the last 168 hours. BNP (last 3 results) No results for input(s): PROBNP in the last 8760 hours. HbA1C: No results for input(s): HGBA1C in the last 72 hours. CBG: Recent Labs  Lab 10/25/20 2002 10/25/20 2356 10/26/20 0435 10/26/20 0728 10/26/20 1204  GLUCAP 146* 124* 84 97 99   Lipid Profile: No results for input(s): CHOL, HDL, LDLCALC, TRIG, CHOLHDL, LDLDIRECT in the last 72 hours. Thyroid Function Tests: No results for input(s): TSH, T4TOTAL, FREET4, T3FREE, THYROIDAB in the last 72 hours. Anemia Panel: No results for input(s): VITAMINB12, FOLATE, FERRITIN, TIBC, IRON, RETICCTPCT in the last 72 hours.    Radiology Studies: I have reviewed all of the imaging during this hospital visit personally     Scheduled Meds: . amiodarone  200 mg Oral BID  . apixaban  5 mg Oral BID  . atorvastatin  20 mg Oral Daily  . Chlorhexidine Gluconate Cloth  6 each Topical Q0600  . insulin aspart  0-6 Units Subcutaneous Q4H  . insulin glargine  10 Units Subcutaneous Daily  . pantoprazole  40 mg Oral Daily  . sodium chloride flush  3 mL Intravenous Q12H  . sodium chloride flush  3 mL Intravenous Q12H  . sodium chloride flush  3 mL Intravenous Q12H  . sodium chloride flush  3 mL Intravenous Q12H   Continuous Infusions: . sodium chloride    . sodium chloride       LOS: 19 days        Bonnell Public, MD

## 2020-10-26 NOTE — Progress Notes (Signed)
Patient ID: Kyle Hicks, male   DOB: 09-Apr-1941, 80 y.o.   MRN: [id number]     Advanced Heart Failure Rounding Note  PCP-Cardiologist: No primary care provider on file.    Patient Profile   80 y/o male with obesity and HTN. Admitted with acute HF in setting of AF with RVR of unknown duration.   Echo LVEF 10-15%. RV severely reduced. PICC placed with initial co-ox 40% suggesting markedly low output. CVP up  Subjective:    TEE 3/18 w/ severe Biv dysfunction, Mod-severe MR, severe TR. No LAA thrombus. DCCV unsuccessful  3/24 Diuresing with lasix drip and poor response.  RHC with elevated filling pressures R>L HF , CO 6/CI 2.7. Creatinine 3.5  Temp HD cath placed.  3/25 started removing UF   Weight down 36 pounds.  Cath last week with no CAD. Remains in NSR on po amio. No bleeding with eliquis.  Says he feels much better. No SOB, orthopnea or PND. Wants to go home and go fishing.    Objective:   Weight Range: 103.7 kg Body mass index is 36.9 kg/m.   Vital Signs:   Temp:  [97.8 F (36.6 C)-98.8 F (37.1 C)] 98.6 F (37 C) (04/03 0725) Pulse Rate:  [66-78] 78 (04/03 0800) Resp:  [18-20] 19 (04/03 0400) BP: (96-141)/(60-70) 114/62 (04/03 0800) SpO2:  [94 %-97 %] 94 % (04/03 0800) Weight:  [103.7 kg-104.1 kg] 103.7 kg (04/03 0400) Last BM Date: 10/25/20  Weight change: Filed Weights   10/25/20 1400 10/25/20 1715 10/26/20 0400  Weight: 106.8 kg 104.1 kg 103.7 kg    Intake/Output:   Intake/Output Summary (Last 24 hours) at 10/26/2020 1541 Last data filed at 10/26/2020 1249 Gross per 24 hour  Intake 320 ml  Output 2542 ml  Net -2222 ml      Physical Exam   General:  Well appearing. No resp difficulty HEENT: normal Neck: supple. no JVD. Carotids 2+ bilat; no bruits. No lymphadenopathy or thryomegaly appreciated. Cor: PMI nondisplaced. Regular rate & rhythm. No rubs, gallops or murmurs. Lungs: clear Abdomen: obese  soft, nontender, nondistended. No  hepatosplenomegaly. No bruits or masses. Good bowel sounds. Extremities: no cyanosis, clubbing, rash, tr edema Neuro: alert & orientedx3, cranial nerves grossly intact. moves all 4 extremities w/o difficulty. Affect pleasant   Telemetry   Sinus  70's-80's  Labs    CBC Recent Labs    10/25/20 0451 10/25/20 1343  WBC 4.4 5.4  HGB 10.0* 10.5*  HCT 30.0* 31.3*  MCV 82.6 83.9  PLT 142* [id number]   Basic Metabolic Panel Recent Labs    10/24/20 0501 10/24/20 1016 10/24/20 1024 10/25/20 1342  NA 133*   < > 133*  133* 133*  K 3.6   < > 3.9  3.9 3.7  CL 100  --   --  100  CO2 23  --   --  23  GLUCOSE 81  --   --  195*  BUN 39*  --   --  46*  CREATININE 5.25*  --   --  6.17*  CALCIUM 8.0*  --   --  8.3*  PHOS  --   --   --  2.5   < > = values in this interval not displayed.   Liver Function Tests Recent Labs    10/25/20 1342  ALBUMIN 2.6*   No results for input(s): LIPASE, AMYLASE in the last 72 hours. Cardiac Enzymes No results for input(s): CKTOTAL, CKMB, CKMBINDEX, TROPONINI in the last  72 hours.  BNP: BNP (last 3 results) Recent Labs    10/07/20 0229  BNP 4,299.0*    ProBNP (last 3 results) No results for input(s): PROBNP in the last 8760 hours.   D-Dimer No results for input(s): DDIMER in the last 72 hours. Hemoglobin A1C No results for input(s): HGBA1C in the last 72 hours. Fasting Lipid Panel No results for input(s): CHOL, HDL, LDLCALC, TRIG, CHOLHDL, LDLDIRECT in the last 72 hours. Thyroid Function Tests No results for input(s): TSH, T4TOTAL, T3FREE, THYROIDAB in the last 72 hours.  Invalid input(s): FREET3  Other results:   Imaging    No results found.   Medications:     Scheduled Medications: . amiodarone  200 mg Oral BID  . apixaban  5 mg Oral BID  . atorvastatin  20 mg Oral Daily  . Chlorhexidine Gluconate Cloth  6 each Topical Q0600  . insulin aspart  0-6 Units Subcutaneous Q4H  . insulin glargine  10 Units Subcutaneous Daily   . pantoprazole  40 mg Oral Daily  . sodium chloride flush  3 mL Intravenous Q12H  . sodium chloride flush  3 mL Intravenous Q12H  . sodium chloride flush  3 mL Intravenous Q12H  . sodium chloride flush  3 mL Intravenous Q12H    Infusions: . sodium chloride    . sodium chloride      PRN Medications: sodium chloride, sodium chloride, acetaminophen, albuterol, dextrose, Gerhardt's butt cream, heparin sodium (porcine), hydrocortisone cream, lip balm, ondansetron (ZOFRAN) IV, sodium chloride flush, sodium chloride flush, sodium chloride flush  Assessment/Plan   1. A Flutter RVR - Started on amiodarone drip. - Failed attempt at [place] 3/18 - Now in NSR on po amio On Eliquis,  - Can decrease amio to 200 daily on d/c   2. 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  - Echo this admit w/ severe biventricular HF with EF < 15%.  BNP > 4000 . Suspect tachy-mediated. HS Trop - no trend. No chest pain.   - PICC placed initial Co-ox 40%. Started Milrinone 0.25 converted to NSR then milrinone weaned off - Had [place] 3/24 with elevated filling pressures R>L and preserved cardiac output.  - CO-OX has remained stable off milrinone today 64% - No bb w/ low output  - No arb/dig/spiro with ESRD  - Will not start bidil given marginal BPs and need for HD (and to keep renal perfusion high to hope for possible renal recovery)  - Volume status much improved with HD. Diuretics per renal - Cath last week with no CAD - Hopefully EF improves with maintenance of NSR. Will do limited echo for EF in the am.   3. AKI on CKD Stage IIIa - Creatinine on admit 2.8.  Most recent creatinine was 1.55 back in 03/2020. - started on milrinone for low output - Kidneys didn't recover despite improved CO with milrinone and NSR, likely HD dependent, tolerating HD without milrinone support - HD cath now in place and being set up for outpatient dialysis - hopefully ready for d/c in am  4.  DMII - Hgb A1C 5.9  - On lantus/SSI  5.Upper/Lower Extremity Edema - Dopplers negative for DVT - 2/2 CHF Continue UF removal + unna boots   6. 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  -Continue PPI  7. HTN  -Soft.   8. Hypervolemic Hyponatremia -Restrict free water, dialysis.   Length of Stay: 74  Glori Bickers, MD  10/26/2020,  3:41 PM  Advanced Heart Failure Team Pager [phone] (M-F; 7a - 5p)

## 2020-10-27 ENCOUNTER — Other Ambulatory Visit: Payer: Self-pay

## 2020-10-27 ENCOUNTER — Inpatient Hospital Stay (HOSPITAL_COMMUNITY): Payer: Medicare HMO

## 2020-10-27 DIAGNOSIS — I480 Paroxysmal atrial fibrillation: Secondary | ICD-10-CM

## 2020-10-27 DIAGNOSIS — N189 Chronic kidney disease, unspecified: Secondary | ICD-10-CM | POA: Diagnosis not present

## 2020-10-27 DIAGNOSIS — N179 Acute kidney failure, unspecified: Secondary | ICD-10-CM | POA: Diagnosis not present

## 2020-10-27 DIAGNOSIS — IMO0002 Reserved for concepts with insufficient information to code with codable children: Secondary | ICD-10-CM

## 2020-10-27 DIAGNOSIS — I5041 Acute combined systolic (congestive) and diastolic (congestive) heart failure: Secondary | ICD-10-CM | POA: Diagnosis not present

## 2020-10-27 DIAGNOSIS — E1129 Type 2 diabetes mellitus with other diabetic kidney complication: Secondary | ICD-10-CM | POA: Diagnosis not present

## 2020-10-27 DIAGNOSIS — I5030 Unspecified diastolic (congestive) heart failure: Secondary | ICD-10-CM | POA: Diagnosis not present

## 2020-10-27 DIAGNOSIS — I5082 Biventricular heart failure: Secondary | ICD-10-CM

## 2020-10-27 DIAGNOSIS — R197 Diarrhea, unspecified: Secondary | ICD-10-CM | POA: Diagnosis not present

## 2020-10-27 DIAGNOSIS — E1122 Type 2 diabetes mellitus with diabetic chronic kidney disease: Secondary | ICD-10-CM

## 2020-10-27 DIAGNOSIS — D649 Anemia, unspecified: Secondary | ICD-10-CM | POA: Diagnosis not present

## 2020-10-27 DIAGNOSIS — I130 Hypertensive heart and chronic kidney disease with heart failure and stage 1 through stage 4 chronic kidney disease, or unspecified chronic kidney disease: Secondary | ICD-10-CM | POA: Diagnosis not present

## 2020-10-27 DIAGNOSIS — N1831 Chronic kidney disease, stage 3a: Secondary | ICD-10-CM | POA: Diagnosis not present

## 2020-10-27 LAB — RENAL FUNCTION PANEL
Albumin: 2.5 g/dL — ABNORMAL LOW (ref 3.5–5.0)
Anion gap: 11 (ref 5–15)
BUN: 33 mg/dL — ABNORMAL HIGH (ref 8–23)
CO2: 24 mmol/L (ref 22–32)
Calcium: 8.1 mg/dL — ABNORMAL LOW (ref 8.9–10.3)
Chloride: 99 mmol/L (ref 98–111)
Creatinine, Ser: 5.42 mg/dL — ABNORMAL HIGH (ref 0.61–1.24)
GFR, Estimated: 10 mL/min — ABNORMAL LOW (ref >60)
Glucose, Bld: 94 mg/dL (ref 70–99)
Phosphorus: 2.2 mg/dL — ABNORMAL LOW (ref 2.5–4.6)
Potassium: 3.4 mmol/L — ABNORMAL LOW (ref 3.5–5.1)
Sodium: 134 mmol/L — ABNORMAL LOW (ref 135–145)

## 2020-10-27 LAB — GLUCOSE, CAPILLARY
Glucose-Capillary: 115 mg/dL — ABNORMAL HIGH (ref 70–99)
Glucose-Capillary: 91 mg/dL (ref 70–99)
Glucose-Capillary: 93 mg/dL (ref 70–99)
Glucose-Capillary: 98 mg/dL (ref 70–99)

## 2020-10-27 LAB — ECHOCARDIOGRAM COMPLETE
AR max vel: 2.69 cm2
AV Area VTI: 2.52 cm2
AV Area mean vel: 2.6 cm2
AV Mean grad: 5.5 mmHg
AV Peak grad: 8.9 mmHg
Ao pk vel: 1.5 m/s
Area-P 1/2: 5.54 cm2
Calc EF: 40.1 %
Height: 66 in
MV M vel: 4.95 m/s
MV Peak grad: 98 mmHg
MV VTI: 2.3 cm2
P 1/2 time: 413 msec
Radius: 0.6 cm
S' Lateral: 5.1 cm
Single Plane A2C EF: 42.6 %
Single Plane A4C EF: 37.7 %
Weight: 3696.67 oz

## 2020-10-27 LAB — COOXEMETRY PANEL
Carboxyhemoglobin: 1.2 % (ref 0.5–1.5)
Methemoglobin: 1 % (ref 0.0–1.5)
O2 Saturation: 63 %
Total hemoglobin: 10.2 g/dL — ABNORMAL LOW (ref 12.0–16.0)

## 2020-10-27 MED ORDER — HYDROCORTISONE 1 % EX CREA: 1.0000 "application " | TOPICAL_CREAM | Freq: Three times a day (TID) | CUTANEOUS | 0 refills | Status: DC | PRN

## 2020-10-27 MED ORDER — APIXABAN 5 MG PO TABS: 5.0000 mg | ORAL_TABLET | Freq: Two times a day (BID) | ORAL | 3 refills | Status: DC

## 2020-10-27 MED ORDER — TORSEMIDE 100 MG PO TABS: 100.0000 mg | ORAL_TABLET | Freq: Every day | ORAL | 3 refills | Status: DC

## 2020-10-27 MED ORDER — ATORVASTATIN CALCIUM 20 MG PO TABS: 20.0000 mg | ORAL_TABLET | Freq: Every day | ORAL | 3 refills | Status: DC

## 2020-10-27 MED ORDER — AMIODARONE HCL 200 MG PO TABS: 200.0000 mg | ORAL_TABLET | Freq: Every day | ORAL | 3 refills | Status: DC

## 2020-10-27 MED ORDER — PANTOPRAZOLE SODIUM 40 MG PO TBEC: 40.0000 mg | DELAYED_RELEASE_TABLET | Freq: Every day | ORAL | 2 refills | Status: DC

## 2020-10-27 MED ORDER — AMIODARONE HCL 200 MG PO TABS: 200.0000 mg | ORAL_TABLET | Freq: Two times a day (BID) | ORAL | 3 refills | Status: DC

## 2020-10-27 NOTE — Plan of Care (Signed)
  Problem: Fluid Volume: Goal: Fluid volume balance will be maintained or improved Outcome: Completed/Met   Problem: Activity: Goal: Activity intolerance will improve Outcome: Completed/Met   Problem: Clinical Measurements: Goal: Will remain free from infection Outcome: Completed/Met

## 2020-10-27 NOTE — Discharge Summary (Signed)
Physician Discharge Summary   Patient ID: CHILD CAMPOY MRN: [id number] DOB/AGE: [birth year] 80 y.o.  Admit date: 10/07/2020 Discharge date: 10/27/2020  Primary Care Physician:  Sharilyn Sites, MD   Recommendations for Outpatient Follow-up:  1. Follow up with PCP in 1-2 weeks 2. 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 Hyponatremia, hypokalemia with anion gap metabolic acidosis . Acute kidney injury superimposed on chronic kidney disease 3a ([place]) . Obesity (BMI 30-39.9) . Diabetes mellitus type 2, NIDDM . Essential hypertension Dyslipidemia   Consults:  Cardiology Nephrology  Allergies:  No Known Allergies   DISCHARGE MEDICATIONS: Allergies as of 10/27/2020   No Known Allergies     Medication List    STOP taking these medications   hydrochlorothiazide 25 MG tablet Commonly known as: HYDRODIURIL   Janumet 50-1000 MG tablet Generic drug: sitaGLIPtin-metformin   NIFEdipine 30 MG 24 hr tablet Commonly known as: PROCARDIA-XL/NIFEDICAL-XL   ramipril 10 MG capsule Commonly known as: ALTACE     TAKE these medications   accu-chek multiclix lancets Use as instructed bid. E11.65   amiodarone 200 MG tablet Commonly known as: PACERONE Take 1 tablet (200 mg total) by mouth daily.   apixaban 5 MG Tabs tablet Commonly known as: ELIQUIS Take 1 tablet (5 mg total) by mouth 2 (two) times daily.   atorvastatin 20 MG tablet Commonly known as: LIPITOR Take 1 tablet (20 mg total) by mouth daily.   Avenoc Oint Apply 1 application topically daily as needed (foot care).   blood glucose meter kit and supplies Kit Dispense based on  patient and insurance preference. Use up to 2 times daily as directed. (FOR ICD-10 E11.65)   DRY EYES OP Apply 1 drop to eye daily as needed (for dry eyes).   glipiZIDE 5 MG tablet Commonly known as: GLUCOTROL Take 1 tablet by mouth once daily with breakfast What changed: when to take this   glucose blood test strip Commonly known as: Accu-Chek Guide Use as instructed bid E11.65   hydrocortisone cream 1 % Apply 1 application topically 3 (three) times daily as needed for itching (minor skin irritation).   pantoprazole 40 MG tablet Commonly known as: PROTONIX Take 1 tablet (40 mg total) by mouth daily. Start taking on: October 28, 2020   torsemide 100 MG tablet Commonly known as: DEMADEX Take 1 tablet (100 mg total) by mouth daily.            Durable Medical Equipment  (From admission, onward)         Start     Ordered   10/10/20 1454  For home use only DME Walker rolling  Once       Comments: Standard walker - no wheels  Question Answer Comment  Walker: With [place]   Patient needs a walker to treat with the following condition Decreased functional mobility and endurance      10/10/20 1453   10/10/20 1454  For home use only DME 3 n 1  Once        10/10/20 1453           Brief H and P: For complete details please refer to admission H and P, but in brief patient  is a 80 year old male with history of  hypertension, CHF, diabetes mellitus type 2, prior history of GI bleed presented after a mechanical fall.  On the day of admission he got up to use the restroom, felt very week and sustained a mechanical fall. No head trauma or loss of consciousness. Reported lower extremity edema, dyspnea, wheezing and palpitations. Positive diarrhea. On his initial physical examination heart rate 129-134, respiratory rate 19-43, blood pressure 105/73-130 /108, oxygen saturation 98% on supplemental oxygen. His lungs had no wheezing or rales, heart S1-S2, present, rhythmic,  tachycardic, abdomen soft, positive lower extremity edema. Chest radiograph with cardiomegaly, positive hilar vascular congestion, small right pleural effusion. EKG 133 bpm, left axis deviation, Q waves lead II, lead III, aVF, prolonged QTc545, right bundle branch block,atrial flutter, no ST segment changes, negative T wave V1-V3.  Patientwas suspected to have low output heart failure andstarted on milrinone infusion along with furosemide drip.Amiodarone drip for rate control atrial flutter.  Underwent right heart cath with PCW 22 with PA mean 33 and cardiac output 6.0 per Fick and 6.9 per thermodilution. (on milrinone 0.25 mcg/kg/hr) Right HD IJ HD cathter placed for further fluid removal per ultrafiltration. Underwent ultrafiltrationon03/25 (3,000 ml)and on 03/26 (2,800 ml)with good toleration Patient successfully weaned of furosemide drip and milrinone, improved heart rate with amiodarone.  Unfortunately renal function not recovering, will continue hemodialysis  Hospital Course:     Acute on chronic systolic CHF (congestive heart failure) ([place]), biventricular failure, acute cor pulmonale -2D echo showed severe biventricular heart failure with EF less than 15% global hypokinesis, severely reduced RV function, severe TR, biatrial enlargement - BNP > 4000, possibly tachycardia mediated.  Cardiology was consulted.  Patient was placed on milrinone, converted to NSR then milrinone was weaned off. -TEE 3/18 showed severe biventricular dysfunction, moderate to severe MR, severe TR, no LAA thrombus.  DCCV unsuccessful. 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  Per cardiology hopefully EF will improve with maintenance of normal sinus rhythm.   -Cleared by cardiology to be discharged  Atrial flutter with RVR, prolonged QTC -Unsuccessful DCCV on 3/18 -Maintaining normal sinus rhythm -Patient was placed on amiodarone, decrease to 200 mg daily of discharge.  Continue eliquis  Acute kidney injury on CKD stage IIIa, cardiorenal syndrome, hyponatremia, hypokalemia, AG metabolic acidosis -Likely a combination of hypoperfusion secondary to tachycardia and vascular congestion, cardiorenal syndrome in the setting of depressed EF.  Baseline creatinine 1.2-1.6. -Cardiology recommended torsemide 100 mg daily to maintain euvolemia. -Patient had temporary HD line placed on 3/24, underwent HD on 3/25 and 3/26.  Tunneled TDC placed on 3/21. -Outpatient hemodialysis has been arranged at [facility] to start on 10/28/2020.  Per nephrology hold off on permanent access given hopeful for recovery. -Creatinine upon discharge 5.42  Diabetes mellitus type 2, NIDDM with CKD stage III -Janumet discontinued.  For now continue only glipizide. CBGs remain controlled.  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  Essential hypertension -BP currently stable, discontinued ramipril, nifedipine, HCTZ  Cirrhosis with gallbladder abnormality -Initially noted on CT and ultrasound in 2018, at that time patient had refused further evaluation, LFTs within normal limits -Further management deferred to PCP   Day of Discharge S: Doing well, no acute complaints.  Hoping to be discharged home today.  BP 120/69   Pulse 72   Temp (!) 97.2 F (36.2 C) (Oral)   Resp 19   Ht _0  (1.676 m)   Wt 104.8 kg   SpO2 100%   BMI 37.29 kg/m   Physical Exam: General: Alert and awake oriented x3 not in any acute distress. HEENT: anicteric sclera, pupils reactive to light and accommodation CVS: S1-S2 clear no murmur rubs or gallops Chest: clear to auscultation bilaterally, no wheezing rales or rhonchi Abdomen: soft  nontender, nondistended, normal bowel sounds Extremities: 1+ pitting edema, legs wrapped  Neuro: Cranial nerves II-XII intact, no focal neurological deficits    Get Medicines reviewed and adjusted: Please take all your medications with you for your next visit with your Primary MD  Please request your Primary MD to go over all hospital tests and procedure/radiological results at the follow up. Please ask your Primary MD to get all Hospital records sent to his/her office.  If you experience worsening of your admission symptoms, develop shortness of breath, life threatening emergency, suicidal or homicidal thoughts you must seek medical attention immediately by calling 911 or calling your MD immediately  if symptoms less severe.  You must read complete instructions/literature along with all the possible adverse reactions/side effects for all the Medicines you take and that have been prescribed to you. Take any new Medicines after you have completely understood and accept all the possible adverse reactions/side effects.   Do not drive when taking pain medications.   Do not take more than prescribed Pain, Sleep and Anxiety Medications  Special Instructions: If you have smoked or chewed Tobacco  in the last 2 yrs please stop smoking, stop any regular Alcohol  and or any Recreational drug use.  Wear Seat belts while driving.  Please note  You were cared for by a hospitalist during your hospital stay. Once you are discharged, your primary care physician will handle any further medical issues. Please note that NO REFILLS for any discharge medications will be authorized once you are discharged, as it is imperative that you return to your primary care physician (or establish a relationship with a primary care physician if you do not have one) for your aftercare needs so that they can reassess your need for medications and monitor your lab values.   The results of significant diagnostics from this  hospitalization (including imaging, microbiology, ancillary and laboratory) are listed below for reference.      Procedures/Studies:  NM Pulmonary Perfusion  Result Date: 10/07/2020 CLINICAL DATA:  Shortness of breath. EXAM: NUCLEAR MEDICINE PERFUSION LUNG SCAN TECHNIQUE: Perfusion images were obtained in multiple projections after intravenous injection of radiopharmaceutical. Views: Anterior, posterior, left lateral, right lateral, RPO, LPO, RAO, LAO. RADIOPHARMACEUTICALS:  4.0 mCi Tc-10mMAA IV COMPARISON:  Chest radiograph October 07, 2020 FINDINGS: Radiotracer uptake is homogeneous and symmetric bilaterally. No perfusion defects evident. IMPRESSION: No perfusion defects evident. No findings indicative of pulmonary embolus. Electronically Signed   By: WLowella GripIII M.D.   On: 10/07/2020 11:02   CARDIAC CATHETERIZATION  Result Date: 10/24/2020  Mid LAD lesion is 40% stenosed.  Findings: Ao = 89/52 (68) RA = 10 RV = 45/12 PA = 47/17 (28) PCW = 9 Fick cardiac output/index = 6.6/3.1 PVR =2.9 FA sat = 98% PA sat = 65%, 67% Assessment: 1. 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 RCA not injected 2. Mild PAH 3. 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 Plan/Discussion: Continue medical therapy. Consider CT imaging  of thoracic aorta due to resistance passing catheters in proximal thoracic aorta. Glori Bickers, MD 11:23 AM   CARDIAC CATHETERIZATION  Result Date: 10/16/2020 Findings: On milrinone 0.25 mcg/kg/min RA = 15 RV = 52/16 PA = 47/23 (33) PCW = 22 Fick cardiac output/index = 6.0/2.7 Thermo CO/CI = 6.9/3.1 PVR = 1.8 WU Ao sat = 98% PA sat = 62%, 63% PaPi = 1.6 Assessment: 1. Elevated biventricular filling pressures with R>L HF and normal cardiac output Plan/Discussion: He remains markedly volume overloaded with poor response to IV diuresis. HD catheter placed for UF. Glori Bickers, MD 1:51 PM   US RENAL  Result Date:  10/14/2020 CLINICAL DATA:  Acute renal insufficiency EXAM: RENAL / URINARY TRACT ULTRASOUND COMPLETE COMPARISON:  06/22/2017 FINDINGS: Right Kidney: Renal measurements: 11.1 x 5.4 x 6.2 cm = volume: 194 mL. Normal cortical thickness for age. Slightly increased echogenicity. Left Kidney: Renal measurements: 9.6 x 5.6 x 4.4 cm = volume: 124 mL. Renal cortical thinning. Lower pole 3.5 cm cyst. Bladder: Appears normal for degree of bladder distention. Other: Right-sided pleural effusion incidentally noted. Decreased study quality secondary to patient body habitus. IMPRESSION: 1.  No hydronephrosis. 2. Left renal cortical thinning with increased right renal cortical echogenicity, suggesting medical renal disease. 3. Right pleural effusion. 4. Decreased sensitivity and specificity exam due to technique/patient related factors, as described above. Electronically Signed   By: Abigail Miyamoto M.D.   On: 10/14/2020 18:08   PERIPHERAL VASCULAR CATHETERIZATION  Result Date: 10/16/2020 Findings: On milrinone 0.25 mcg/kg/min RA = 15 RV = 52/16 PA = 47/23 (33) PCW = 22 Fick cardiac output/index = 6.0/2.7 Thermo CO/CI = 6.9/3.1 PVR = 1.8 WU Ao sat = 98% PA sat = 62%, 63% PaPi = 1.6 Assessment: 1. Elevated biventricular filling pressures with R>L HF and normal cardiac output Plan/Discussion: He remains markedly volume overloaded with poor response to IV diuresis. HD catheter placed for UF. Glori Bickers, MD 1:51 PM   IR Fluoro Guide CV Line Right  Result Date: 10/23/2020 INDICATION: Renal failure, CHF, access for permanent dialysis EXAM: ULTRASOUND GUIDANCE FOR VASCULAR ACCESS RIGHT INTERNAL JUGULAR PERMANENT HEMODIALYSIS CATHETER Date:  10/23/2020 10/23/2020 11:41 am Radiologist:  M. Daryll Brod, MD Guidance:  Ultrasound and fluoroscopic FLUOROSCOPY TIME:  Fluoroscopy Time: 0 minutes 18 seconds (6 mGy). MEDICATIONS: 1% lidocaine local ANESTHESIA/SEDATION: Versed 1.5 mg IV; Fentanyl 25 mcg IV; Moderate Sedation Time:  11  minutes The patient was continuously monitored during the procedure by the interventional radiology nurse under my direct supervision. CONTRAST:  None. COMPLICATIONS: None immediate. PROCEDURE: Informed consent was obtained from the patient following explanation of the procedure, risks, benefits and alternatives. The patient understands, agrees and consents for the procedure. All questions were addressed. A time out was performed. Maximal barrier sterile technique utilized including caps, mask, sterile gowns, sterile gloves, large sterile drape, hand hygiene, and 2% chlorhexidine scrub. Under sterile conditions and local anesthesia, right internal jugular micropuncture venous access was performed with ultrasound. Images were obtained for documentation of the patent right internal jugular vein. A guide wire was inserted followed by a transitional dilator. Next, a 0.035 guidewire was advanced into the IVC with a 5-French catheter. Measurements were obtained from the right venotomy site to the proximal right atrium. In the right infraclavicular chest, a subcutaneous tunnel was created under sterile conditions and local anesthesia. 1% lidocaine with epinephrine was utilized for this. The 23 cm tip to cuff palindrome catheter was tunneled subcutaneously to the venotomy site and inserted into the  SVC/RA junction through a valved peel-away sheath. Position was confirmed with fluoroscopy. Images were obtained for documentation. Blood was aspirated from the catheter followed by saline and heparin flushes. The appropriate volume and strength of heparin was instilled in each lumen. Caps were applied. The catheter was secured at the tunnel site with Gelfoam and a pursestring suture. The venotomy site was closed with subcuticular Vicryl suture. Dermabond was applied to the small right neck incision. A dry sterile dressing was applied. The catheter is ready for use. No immediate complications. IMPRESSION: Ultrasound and  fluoroscopically guided right internal jugular tunneled hemodialysis catheter (23 cm tip to cuff palindrome catheter). Electronically Signed   By: Jerilynn Mages.  Shick M.D.   On: 10/23/2020 11:54   IR US Guidance  Result Date: 10/23/2020 INDICATION: Renal failure, CHF, access for permanent dialysis EXAM: ULTRASOUND GUIDANCE FOR VASCULAR ACCESS RIGHT INTERNAL JUGULAR PERMANENT HEMODIALYSIS CATHETER Date:  10/23/2020 10/23/2020 11:41 am Radiologist:  Jerilynn Mages. Daryll Brod, MD Guidance:  Ultrasound and fluoroscopic FLUOROSCOPY TIME:  Fluoroscopy Time: 0 minutes 18 seconds (6 mGy). MEDICATIONS: 1% lidocaine local ANESTHESIA/SEDATION: Versed 1.5 mg IV; Fentanyl 25 mcg IV; Moderate Sedation Time:  11 minutes The patient was continuously monitored during the procedure by the interventional radiology nurse under my direct supervision. CONTRAST:  None. COMPLICATIONS: None immediate. PROCEDURE: Informed consent was obtained from the patient following explanation of the procedure, risks, benefits and alternatives. The patient understands, agrees and consents for the procedure. All questions were addressed. A time out was performed. Maximal barrier sterile technique utilized including caps, mask, sterile gowns, sterile gloves, large sterile drape, hand hygiene, and 2% chlorhexidine scrub. Under sterile conditions and local anesthesia, right internal jugular micropuncture venous access was performed with ultrasound. Images were obtained for documentation of the patent right internal jugular vein. A guide wire was inserted followed by a transitional dilator. Next, a 0.035 guidewire was advanced into the IVC with a 5-French catheter. Measurements were obtained from the right venotomy site to the proximal right atrium. In the right infraclavicular chest, a subcutaneous tunnel was created under sterile conditions and local anesthesia. 1% lidocaine with epinephrine was utilized for this. The 23 cm tip to cuff palindrome catheter was tunneled  subcutaneously to the venotomy site and inserted into the SVC/RA junction through a valved peel-away sheath. Position was confirmed with fluoroscopy. Images were obtained for documentation. Blood was aspirated from the catheter followed by saline and heparin flushes. The appropriate volume and strength of heparin was instilled in each lumen. Caps were applied. The catheter was secured at the tunnel site with Gelfoam and a pursestring suture. The venotomy site was closed with subcuticular Vicryl suture. Dermabond was applied to the small right neck incision. A dry sterile dressing was applied. The catheter is ready for use. No immediate complications. IMPRESSION: Ultrasound and fluoroscopically guided right internal jugular tunneled hemodialysis catheter (23 cm tip to cuff palindrome catheter). Electronically Signed   By: Jerilynn Mages.  Shick M.D.   On: 10/23/2020 11:54   DG CHEST PORT 1 VIEW  Result Date: 10/09/2020 CLINICAL DATA:  PICC line placement. EXAM: PORTABLE CHEST 1 VIEW COMPARISON:  02/23/2013 FINDINGS: Right arm PICC line tip is in the projection of the distal SVC. Stable mild cardiac enlargement. Small right pleural effusion unchanged from previous exam. No airspace opacities or interstitial edema. IMPRESSION: 1. Tip of right arm PICC line is in the projection of the distal SVC. 2. Persistent small right pleural effusion. Electronically Signed   By: Queen Slough.D.  On: 10/09/2020 11:07   DG Chest Portable 1 View  Result Date: 10/07/2020 CLINICAL DATA:  [name], short of breath, tachycardia EXAM: PORTABLE CHEST 1 VIEW COMPARISON:  02/23/2013 FINDINGS: Single frontal view of the chest demonstrates an enlarged cardiac silhouette. Small right pleural effusion obscures the costophrenic angle. No airspace disease or pneumothorax. No acute bony abnormalities. IMPRESSION: 1. Enlarged cardiac silhouette. 2. Small right pleural effusion. Electronically Signed   By: Randa Ngo M.D.   On: 10/07/2020 02:53    ECHOCARDIOGRAM COMPLETE  Result Date: 10/08/2020    ECHOCARDIOGRAM REPORT   Patient Name:   Kyle Hicks Kinch Date of Exam: 10/08/2020 Medical Rec #:  [id number]      Height:       66.0 in Accession #:    [id number]     Weight:       260.4 lb Date of Birth:  [birth year]      BSA:          2.237 m Patient Age:    74 years       BP:           117/91 mmHg Patient Gender: M              HR:           131 bpm. Exam Location:  Inpatient Procedure: 2D Echo, Cardiac Doppler, Color Doppler and Intracardiac            Opacification Agent Indications:    I50.40* Unspecified combined systolic (congestive) and diastolic                 (congestive) heart failure  History:        Patient has prior history of Echocardiogram examinations, most                 recent 04/25/2017. Abnormal ECG, Arrythmias:Atrial Fibrillation,                 Signs/Symptoms:Chest Pain; Risk Factors:Dyslipidemia, Diabetes                 and Hypertension. Elevated troponin.  Sonographer:    Roseanna Rainbow RDCS Referring Phys: [id number] RONDELL A SMITH  Sonographer Comments: Technically difficult study due to poor echo windows and patient is morbidly obese. Image acquisition challenging due to patient body habitus. IMPRESSIONS  1. Left ventricular ejection fraction, by estimation, is 10-15%. The left ventricle has severely decreased function. The left ventricle demonstrates global hypokinesis. There is mild concentric left ventricular hypertrophy. Left ventricular diastolic function could not be evaluated.  2. Right ventricular systolic function is severely reduced. The right ventricular size is mildly enlarged. There is moderately elevated pulmonary artery systolic pressure. The estimated right ventricular systolic pressure is 30.8 mmHg.  3. Left atrial size was moderately dilated.  4. The mitral valve is grossly normal. Mild mitral valve regurgitation. No evidence of mitral stenosis.  5. The aortic valve is tricuspid. Aortic valve regurgitation is mild. No  aortic stenosis is present.  6. 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  7. The inferior vena cava is dilated in size with <50% respiratory variability, suggesting right atrial pressure of 15 mmHg. Comparison(s): Changes from prior study are noted. EF now severely reduced. Afib with RVR now present. FINDINGS  Left Ventricle: Left ventricular ejection fraction, by estimation, is 10-15%. The left ventricle has severely decreased function. The left ventricle demonstrates global hypokinesis. Definity contrast agent was given IV to delineate the left ventricular  endocardial borders. The left ventricular internal cavity size was normal in size. There is mild concentric left ventricular hypertrophy. Left ventricular diastolic function could not be evaluated due to atrial fibrillation. Left ventricular diastolic function could not be evaluated. Right Ventricle: The right ventricular size is mildly enlarged. No increase in right ventricular wall thickness. Right ventricular systolic function is severely reduced. There is moderately elevated pulmonary artery systolic pressure. The tricuspid regurgitant velocity is 2.92 m/s, and with an assumed right atrial pressure of 15 mmHg, the estimated right ventricular systolic pressure is 69.6 mmHg. Left Atrium: Left atrial size was moderately dilated. Right Atrium: Right atrial size was normal in size. Pericardium: Trivial pericardial effusion is present. Mitral Valve: The mitral valve is grossly normal. Mild mitral valve regurgitation. No evidence of mitral valve stenosis. Tricuspid Valve: The tricuspid valve is grossly normal. Tricuspid valve regurgitation is mild . No evidence of tricuspid stenosis. Aortic Valve: The aortic valve is tricuspid. Aortic valve regurgitation is mild. No aortic stenosis is present. Pulmonic Valve: The pulmonic valve was grossly normal. Pulmonic valve regurgitation is trivial. No evidence of pulmonic stenosis. Aorta: The aortic  root is normal in size and structure. 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 Venous: The inferior vena cava is dilated in size with less than 50% respiratory variability, suggesting right atrial pressure of 15 mmHg. IAS/Shunts: The atrial septum is grossly normal. EKG: Rhythm strip during this exam demostrated atrial fibrillation.  LEFT VENTRICLE PLAX 2D LVIDd:         5.40 cm LVIDs:         5.10 cm LV PW:         1.60 cm LV IVS:        1.20 cm LVOT diam:     2.10 cm LV SV:         41 LV SV Index:   18 LVOT Area:     3.46 cm  RIGHT VENTRICLE            IVC RV S prime:     9.67 cm/s  IVC diam: 3.10 cm TAPSE (M-mode): 0.7 cm LEFT ATRIUM              Index       RIGHT ATRIUM           Index LA diam:        3.90 cm  1.74 cm/m  RA Area:     22.40 cm LA Vol (A2C):   129.0 ml 57.67 ml/m RA Volume:   66.40 ml  29.68 ml/m LA Vol (A4C):   86.8 ml  38.80 ml/m LA Biplane Vol: 109.0 ml 48.73 ml/m  AORTIC VALVE LVOT Vmax:   80.90 cm/s LVOT Vmean:  55.100 cm/s LVOT VTI:    0.117 m  AORTA Ao Root diam: 3.60 cm Ao Asc diam:  4.30 cm MITRAL VALVE                 TRICUSPID VALVE MV Area (PHT): 6.52 cm      TR Peak grad:   34.1 mmHg MV Decel Time: 116 msec      TR Vmax:        292.00 cm/s MR Peak grad:    71.6 mmHg MR Mean grad:    44.0 mmHg   SHUNTS MR Vmax:         423.00 cm/s Systemic VTI:  0.12 m MR Vmean:        316.0 cm/s  Systemic Diam: 2.10 cm MR PISA:         0.25 cm MR PISA Eff ROA: 2 mm MR PISA Radius:  0.20 cm MV E velocity: 119.33 cm/s Eleonore Chiquito MD Electronically signed by Eleonore Chiquito MD Signature Date/Time: 10/08/2020/5:30:08 PM    Final    ECHO TEE  Result Date: 10/10/2020    TRANSESOPHOGEAL ECHO REPORT   Patient Name:   Kyle Hicks Fryberger Date of Exam: 10/10/2020 Medical Rec #:  [id number]      Height:       66.0 in Accession #:    [id number]     Weight:       257.1 lb Date of Birth:  20-Dec-1940      BSA:          2.225 m Patient Age:    84 years       BP:           126/75 mmHg  Patient Gender: M              HR:           127 bpm. Exam Location:  Inpatient Procedure: 2D Echo, Cardiac Doppler and Color Doppler Indications:     R94.31 Abnormal EKG; I48.91* Unspeicified atrial fibrillation  History:         Patient has prior history of Echocardiogram examinations, most                  recent 10/08/2020. CHF; Risk Factors:Hypertension, Diabetes and                  Dyslipidemia.  Sonographer:     Roseanna Rainbow RDCS Referring Phys:  [place] Diagnosing Phys: Kirk Ruths MD PROCEDURE: After discussion of the risks and benefits of a TEE, an informed consent was obtained from the patient. The transesophogeal probe was passed without difficulty through the esophogus of the patient. Imaged were obtained with the patient in a left lateral decubitus position. Sedation performed by different physician. The patient was monitored while under deep sedation. The patient developed no complications during the procedure. An unsuccessful direct current cardioversion was performed at 200 joules with 3 attempts. Ketamine 68m. IMPRESSIONS  1. Left ventricular ejection fraction, by estimation, is <20%. The left ventricle has severely decreased function. The left ventricle demonstrates global hypokinesis. The left ventricular internal cavity size was mildly dilated.  2. Right ventricular systolic function is severely reduced. The right ventricular size is severely enlarged.  3. Left atrial size was moderately dilated. No left atrial/left atrial appendage thrombus was detected.  4. Right atrial size was moderately dilated.  5. The mitral valve is abnormal. 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  6. Tricuspid valve regurgitation is severe.  7. The aortic valve is tricuspid. Aortic valve regurgitation is mild. Mild aortic valve sclerosis is present, with no evidence of aortic valve stenosis.  8. Aortic dilatation noted. There is mild dilatation of the aortic root, measuring 40 mm. FINDINGS  Left  Ventricle: Left ventricular ejection fraction, by estimation, is <20%. The left ventricle has severely decreased function. The left ventricle demonstrates global hypokinesis. The left ventricular internal cavity size was mildly dilated. Right Ventricle: The right ventricular size is severely enlarged. Right vetricular wall thickness was not assessed. Right ventricular systolic function is severely reduced. Left Atrium: Left atrial size was moderately dilated. Spontaneous echo contrast was present in the left atrium and left atrial appendage. No left atrial/left atrial appendage thrombus was detected.  Right Atrium: Right atrial size was moderately dilated. Pericardium: There is no evidence of pericardial effusion. Mitral Valve: The mitral valve is abnormal. 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 Tricuspid Valve: The tricuspid valve is normal in structure. Tricuspid valve regurgitation is severe. Aortic Valve: The aortic valve is tricuspid. Aortic valve regurgitation is mild. Mild aortic valve sclerosis is present, with no evidence of aortic valve stenosis. Pulmonic Valve: The pulmonic valve was grossly normal. Pulmonic valve regurgitation is trivial. Aorta: Aortic dilatation noted. There is mild dilatation of the aortic root, measuring 40 mm. IAS/Shunts: No atrial level shunt detected by color flow Doppler. Kirk Ruths MD Electronically signed by Kirk Ruths MD Signature Date/Time: 10/10/2020/10:31:47 AM    Final    VAS [place] LOWER EXTREMITY VENOUS (DVT)  Result Date: 10/09/2020  Lower Venous DVT Study Indications: Edema, and elevated D-dimer.  Risk Factors: CHF, CKD3. Limitations: Body habitus, poor ultrasound/tissue interface and Difficulty getting color fill likely due to habitus. Comparison Study: No previous exams Performing Technologist: Rogelia Rohrer  Examination Guidelines: A complete evaluation includes B-mode imaging, spectral Doppler, color Doppler, and power Doppler as needed of all accessible  portions of each vessel. Bilateral testing is considered an integral part of a complete examination. Limited examinations for reoccurring indications may be performed as noted. The reflux portion of the exam is performed with the patient in reverse Trendelenburg.  +---------+---------------+---------+-----------+----------+--------------+ RIGHT    CompressibilityPhasicitySpontaneityPropertiesThrombus Aging +---------+---------------+---------+-----------+----------+--------------+ CFV      Full           Yes      Yes                                 +---------+---------------+---------+-----------+----------+--------------+ SFJ      Full                                                        +---------+---------------+---------+-----------+----------+--------------+ FV Prox  Full           Yes      Yes                                 +---------+---------------+---------+-----------+----------+--------------+ FV Mid   Full           Yes      Yes                                 +---------+---------------+---------+-----------+----------+--------------+ FV DistalFull           Yes      Yes                                 +---------+---------------+---------+-----------+----------+--------------+ PFV      Full                                                        +---------+---------------+---------+-----------+----------+--------------+ POP      Full  Yes      Yes                                 +---------+---------------+---------+-----------+----------+--------------+ PTV      Full                                                        +---------+---------------+---------+-----------+----------+--------------+ PERO     Full                                                        +---------+---------------+---------+-----------+----------+--------------+   +---------+---------------+---------+-----------+----------+--------------+ LEFT      CompressibilityPhasicitySpontaneityPropertiesThrombus Aging +---------+---------------+---------+-----------+----------+--------------+ CFV      Full           Yes      Yes                                 +---------+---------------+---------+-----------+----------+--------------+ SFJ      Full                                                        +---------+---------------+---------+-----------+----------+--------------+ FV Prox  Full           Yes      Yes                                 +---------+---------------+---------+-----------+----------+--------------+ FV Mid   Full           Yes      Yes                                 +---------+---------------+---------+-----------+----------+--------------+ FV DistalFull           Yes      Yes                                 +---------+---------------+---------+-----------+----------+--------------+ PFV      Full                                                        +---------+---------------+---------+-----------+----------+--------------+ POP      Full           Yes      Yes                                 +---------+---------------+---------+-----------+----------+--------------+ PTV      Full                                                        +---------+---------------+---------+-----------+----------+--------------+  PERO     Full                                                        +---------+---------------+---------+-----------+----------+--------------+     Summary: BILATERAL: - No evidence of deep vein thrombosis seen in the lower extremities, bilaterally. - No evidence of superficial venous thrombosis in the lower extremities, bilaterally. -No evidence of popliteal cyst, bilaterally.   *See table(s) above for measurements and observations. Electronically signed by Servando Snare MD on 10/09/2020 at 6:01:59 PM.    Final    [place] EKG SITE RITE  Result Date: 10/09/2020 If Site Rite image  not attached, placement could not be confirmed due to current cardiac rhythm.     LAB RESULTS: Basic Metabolic Panel: Recent Labs  Lab 10/23/20 0500 10/24/20 0501 10/25/20 1342 10/27/20 0515  NA 133*   < > 133* 134*  K 3.7   < > 3.7 3.4*  CL 99   < > 100 99  CO2 25   < > 23 24  GLUCOSE 81   < > 195* 94  BUN 35*   < > 46* 33*  CREATININE 4.40*   < > 6.17* 5.42*  CALCIUM 8.2*   < > 8.3* 8.1*  MG 2.0  --   --   --   PHOS  --    < > 2.5 2.2*   < > = values in this interval not displayed.   Liver Function Tests: Recent Labs  Lab 10/25/20 1342 10/27/20 0515  ALBUMIN 2.6* 2.5*   No results for input(s): LIPASE, AMYLASE in the last 168 hours. No results for input(s): AMMONIA in the last 168 hours. CBC: Recent Labs  Lab 10/25/20 0451 10/25/20 1343  WBC 4.4 5.4  HGB 10.0* 10.5*  HCT 30.0* 31.3*  MCV 82.6 83.9  PLT 142* 154   Cardiac Enzymes: No results for input(s): CKTOTAL, CKMB, CKMBINDEX, TROPONINI in the last 168 hours. BNP: Invalid input(s): POCBNP CBG: Recent Labs  Lab 10/27/20 0516 10/27/20 0827  GLUCAP 91 93       Disposition and Follow-up: Discharge Instructions    (HEART FAILURE PATIENTS) Call MD:  Anytime you have any of the following symptoms: 1) 3 pound weight gain in 24 hours or 5 pounds in 1 week 2) shortness of breath, with or without a dry hacking cough 3) swelling in the hands, feet or stomach 4) if you have to sleep on extra pillows at night in order to breathe.   Complete by: As directed    Diet - low sodium heart healthy   Complete by: As directed    Increase activity slowly   Complete by: As directed    No wound care   Complete by: As directed        DISPOSITION: Home with home health   DISCHARGE FOLLOW-UP  Follow-up Information    [facility] Follow up.   Why: Registered Nurse, Physical Therapy, and Aid - office will call with visit times       [facility] Follow up.   Why: [facility] - Walker, and 3 in 1 - will  be delievered to the room prior to discharge. Contact information: [address] [place] [address] [phone]        Sharilyn Sites, MD. Go on 10/31/2020.  Specialty: Family Medicine Why: _0 :45am please arrive _1 :15am Contact information: [address] [place] [address] [phone]        Bensimhon, Shaune Pascal, MD Follow up in 2 week(s).   Specialty: Cardiology Contact information: [place] [address] [phone]                Time coordinating discharge:  35 minutes  Signed:   Estill Cotta M.D. [facility] 10/27/2020, 11:24 AM

## 2020-10-27 NOTE — TOC Transition Note (Addendum)
Transition of Care [facility]) - CM/SW Discharge Note Heart Failure   Patient Details  Name: Kyle Hicks MRN: [id number] Date of Birth: 07-23-1941  Transition of Care [facility]) CM/SW Contact:  [place], [place] Phone Number: 10/27/2020, 10:48 AM   Clinical Narrative:    Patient will DC to: Home with HHPT/RN/Aid Anticipated DC date: 10/27/20 Family notified: Patients wife [name] Transport by: [employer] transportation   [place] notified [place], [place] with [facility] Bufalini discharge today. CSW spoke with Mrs. Kinzer and informed her that her husband is being discharged today and she asked if the hospital can transport him home. CSW will arrange for transportation for patients discharge. CSW called [employer] and they will deliver DME/3in1 to patients house and will pick up from patients hospital room and deliver to patients home address. CSW informed Ms. Hoefle of 3in1 getting delivered to the home. DC packet on chart. [facility] transport requested for the patient and rider waiver signed. [facility] transport arranging dispatch.  CSW will sign off for now as social work intervention is no longer needed. Please consult [place] again if new needs arise.     Final next level of care: [place] Barriers to Discharge: Barriers Resolved   Patient Goals and CMS Choice Patient states their goals for this hospitalization and ongoing recovery are:: to return home CMS Medicare.gov Compare Post Acute Care list provided to:: Patient Choice offered to / list presented to : Patient  Discharge Placement                Patient to be transferred to facility by: [place] Name of family member notified: Patients wife [name] Patient and family notified of of transfer: 10/27/20  Discharge Plan and Services In-house Referral: Clinical Social Work Discharge Planning Services: CM Consult Post Acute Care Choice: Home Health          DME Arranged: 3-N-1,[facility] DME Agency: [facility] Date DME Agency Contacted:  10/10/20 Time DME Agency Contacted: 0130 Representative spoke with at DME Agency: [facility] [employer] HH Arranged: PT,RN,Nurse's Aide [place]: [facility] ([facility] [employer]) Date [place]: 10/10/20 Time [place]: 1150 Representative spoke with at [place]: [place]  Social Determinants of Health ([place]) Interventions     Readmission Risk Interventions Readmission Risk Prevention Plan 10/27/2020  Transportation Screening Complete  PCP or Specialist Appt within 3-5 Days Complete  HRI or [place] Complete  Social Work Consult for [place] Planning/Counseling [place] Not Applicable  Medication Review Press photographer) Complete  Some recent data might be hidden    [place], MSW, LCSWA [phone] Heart Failure Social Worker

## 2020-10-27 NOTE — Progress Notes (Incomplete)
Echo technician recommended [facility] for Echocardiogram. Pt was ready for a discharge with No IV access. Notified CHF PA, [place]. Was told okay to do Echo without [facility].

## 2020-10-27 NOTE — Social Work (Addendum)
CSW received a call from Vance Rasberry the patients daughter-in-law who was upset about the patients discharge and how he was brought home including the patient needing extensive assistance. As well as concerns about how the patient will get to and from his dialysis appointments. The daughter in law reported concerns about how the patients wife is supposed to get him to and from the car because the patient isn't walking and he wasn't sent home with a wheelchair. CSW informed the daughter in law that the health Insurance will only cover for a walker or a wheelchair but not both and the patients wife asked for a walker. The daughter in law asked why transportation for the patients dialysis wasn't arranged and CSW informed the family member that the patients wife stated that she could take him. CSW wasn't involved with setting up the dialysis but looked into resources for the patient and family. CSW provided the family member with the [place], and [employer] [phone]) phone number to arrange for transportation. The family member asked about the home health arrangements and CSW informed the daughter in law that [facility] will get in contact with the family to set up scheduled times to come to the house for HHPT/RN and Aid for bathing and the family member reported understanding.  Yeiden Frenkel, MSW, [place] Heart Failure Social Worker

## 2020-10-27 NOTE — Progress Notes (Signed)
MD, pt may need his dialysis port stiched in, pt is going home today, thanks [place]

## 2020-10-27 NOTE — Consult Note (Signed)
   [facility] Inpatient Consult   10/27/2020  Kyle Hicks December 27, 1940 [id number]   [employer] [ACO] Patient: Kyle Hicks Medicare  Patient screened for hospitalization with noted high risk score for unplanned readmission risk and to assess for potential [facility] service needs for post hospital transition.  Review of patient's medical record reveals patient is for home with home health today. Primary Care Provider: Sharilyn Sites, MD [facility] is listed to provide the transition of care follow up.  Came by and patient was in a procedure at the bedside. Plan:  Continue to follow progress and disposition to assess for post hospital care management needs.   Patient transitioned home prior to speaking with him. Will assign for community [facility] RN follow up for HF management needs.  For questions contact:   Natividad Brood, RN BSN [place] [facility] Liaison  [phone] business mobile phone Toll free office [phone]  Fax number: [phone] [email or web address]

## 2020-10-27 NOTE — Care Management Important Message (Signed)
Important Message  Patient Details  Name: Kyle Hicks MRN: [id number] Date of Birth: 1941-04-16   Medicare Important Message Given:  Yes     Shelda Altes 10/27/2020, 8:55 AM

## 2020-10-27 NOTE — Progress Notes (Signed)
Physical Therapy Treatment Patient Details Name: Kyle Hicks MRN: [id number] DOB: [birth year] Today's Date: 10/27/2020    History of Present Illness Pt is a 80 y.o. male admitted 10/07/20 with c/o diarrhea, weakness and fall at home. CXR with enlarged cardiac sillhouette, small R-side pleural effusion. Pt also with tachycardia, HF, AKI. Pt with aflutter RVR 3/17. S/p TEE cardioversion 3/18 showing severe biventricular dysfunction, severe TR, mod-severe MR; DCCV unsuccessful. Course complicated by non-oliguric AKI, likely combo of hypoperfusion secondary to tachycardia and vascular congestion; for renal ultrasound 3/22. Plan RHC and placement of HD cath 3/24. Pt underwent heart cath on 10/24/2020. PMH includes HTN, CHF, DM2, CKD IIIa, PUD, GIB.    PT Comments    Patient reports feeling well today and eager to d/c home. Requires heavy use of rails to get into/out of bed but no assist needed with LEs today. Requires use of momentum and multiple attempts to pull up on RW to stand  despite cues for proper technique and hand placement. Tolerated short distance ambulation with Min guard assist and use of RW for support. No DOE noted. VSS on RA with Sp02 remaining in mid-high 90s. Distance limited as pt getting ready to d/c home soon. Will follow if still in the hospital.    Follow Up Recommendations  Home health PT;Supervision for mobility/OOB     Equipment Recommendations  None recommended by PT    Recommendations for Other Services       Precautions / Restrictions Precautions Precautions: Fall Restrictions Weight Bearing Restrictions: No    Mobility  Bed Mobility Overal bed mobility: Needs Assistance Bed Mobility: Supine to Sit;Sit to Supine     Supine to sit: Supervision;HOB elevated Sit to supine: Supervision;HOB elevated   General bed mobility comments: Heavy use of rails to get into/out of bed with increased time, no assist needed.    Transfers Overall transfer level: Needs  assistance Equipment used: Rolling walker (2 wheeled) Transfers: Sit to/from Stand Sit to Stand: Min guard;From elevated surface         General transfer comment: Min guard for safety. Despite cues for hand placement, pt pulling up on RW to stand from EOB. Use of momentum and multiple attempts made prior to standing.  Ambulation/Gait Ambulation/Gait assistance: Min guard Gait Distance (Feet): 32 Feet Assistive device: Rolling walker (2 wheeled) Gait Pattern/deviations: Step-through pattern;Decreased stride length;Trunk flexed;Shuffle Gait velocity: reduced   General Gait Details: Slow, shuffling type gait with flexed trunk, cues for RW proximity. No SOB noted. SP02 remained in high 90s on RA with activity. HR stable. Limited distance as pt getting ready to d/c home.   Stairs             Wheelchair Mobility    Modified Rankin (Stroke Patients Only)       Balance Overall balance assessment: Needs assistance Sitting-balance support: Feet supported;No upper extremity supported Sitting balance-[name] Scale: Fair Sitting balance - Comments: supervision to sit EOB   Standing balance support: During functional activity Standing balance-[name] Scale: Poor Standing balance comment: reliant on BUE support                            Cognition Arousal/Alertness: Awake/alert Behavior During Therapy: Flat affect Overall Cognitive Status: Within Functional Limits for tasks assessed  General Comments: pt very flat but feel this is his baseline, sometimes requires increased time to follow commands but likely due to [place] Comments General comments (skin integrity, edema, etc.): VSS on RA; Sp02 in mid-high 90s on RA, no A-fib.      Pertinent Vitals/Pain Pain Assessment: No/denies pain    Home Living                      Prior Function            PT Goals (current goals can now  be found in the care plan section) Progress towards PT goals: Progressing toward goals (slowly)    Frequency    Min 3X/week      PT Plan Current plan remains appropriate    Co-evaluation              AM-PAC PT "6 Clicks" Mobility   Outcome Measure  Help needed turning from your back to your side while in a flat bed without using bedrails?: A Little Help needed moving from lying on your back to sitting on the side of a flat bed without using bedrails?: A Little Help needed moving to and from a bed to a chair (including a wheelchair)?: A Little Help needed standing up from a chair using your arms (e.g., wheelchair or bedside chair)?: A Little Help needed to walk in hospital room?: A Little Help needed climbing 3-5 steps with a railing? : A Lot 6 Click Score: 17    End of Session Equipment Utilized During Treatment: Gait belt Activity Tolerance: Patient tolerated treatment well Patient left: in bed;with call bell/phone within reach;with bed alarm set Nurse Communication: Mobility status PT Visit Diagnosis: Muscle weakness (generalized) (M62.81);Difficulty in walking, not elsewhere classified (R26.2)     Time: [id number] PT Time Calculation (min) (ACUTE ONLY): 12 min  Charges:  $Therapeutic Activity: 8-22 mins                     Marisa Severin, PT, DPT Acute Rehabilitation Services Pager [phone] Office [place] 10/27/2020, 12:12 PM

## 2020-10-27 NOTE — Progress Notes (Addendum)
Patient ID: Kyle Hicks, male   DOB: Jul 03, 1941, 80 y.o.   MRN: [id number]     Advanced Heart Failure Rounding Note  PCP-Cardiologist: No primary care provider on file.    Patient Profile   81 y/o male with obesity and HTN. Admitted with acute HF in setting of AF with RVR of unknown duration.   Echo LVEF 10-15%. RV severely reduced. PICC placed with initial co-ox 40% suggesting markedly low output. CVP up  Subjective:    TEE 3/18 w/ severe Biv dysfunction, Mod-severe MR, severe TR. No LAA thrombus. DCCV unsuccessful  3/24 Diuresing with lasix drip and poor response.  RHC with elevated filling pressures R>L HF , CO 6/CI 2.7. Creatinine 3.5  Temp HD cath placed.  3/25 started removing UF   Weight down 34 pounds since admission.  Cath last week with no CAD. Remains in NSR on po amio. No bleeding with eliquis.  He denies dyspnea, orthopnea, chest pain.  Didn't sleep well due to some diarrhea overnight but none this am.   Would like to go home.     Objective:   Weight Range: 104.8 kg Body mass index is 37.29 kg/m.   Vital Signs:   Temp:  [98.2 F (36.8 C)-98.6 F (37 C)] 98.6 F (37 C) (04/04 0328) Pulse Rate:  [74-75] 75 (04/04 0328) Resp:  [19-20] 19 (04/04 0328) BP: (108-115)/(60-78) 112/60 (04/04 0328) SpO2:  [96 %-100 %] 100 % (04/04 0328) Weight:  [104.8 kg] 104.8 kg (04/04 0105) Last BM Date: 10/26/20  Weight change: Filed Weights   10/25/20 1715 10/26/20 0400 10/27/20 0105  Weight: 104.1 kg 103.7 kg 104.8 kg    Intake/Output:   Intake/Output Summary (Last 24 hours) at 10/27/2020 0839 Last data filed at 10/27/2020 0815 Gross per 24 hour  Intake 697 ml  Output 75 ml  Net 622 ml      Physical Exam   Cardiac:  normal rate and rhythm, clear s1 and s2, no murmurs, rubs or gallops, trace LE edema Pulmonary: CTAB, not in distress Abdominal: obese, non distended abdomen, soft and nontender Psych: Alert, conversant, in good spirits    Telemetry   Sinus   70's-80's  Labs    CBC Recent Labs    10/25/20 0451 10/25/20 1343  WBC 4.4 5.4  HGB 10.0* 10.5*  HCT 30.0* 31.3*  MCV 82.6 83.9  PLT 142* [id number]   Basic Metabolic Panel Recent Labs    10/25/20 1342 10/27/20 0515  NA 133* 134*  K 3.7 3.4*  CL 100 99  CO2 23 24  GLUCOSE 195* 94  BUN 46* 33*  CREATININE 6.17* 5.42*  CALCIUM 8.3* 8.1*  PHOS 2.5 2.2*   Liver Function Tests Recent Labs    10/25/20 1342 10/27/20 0515  ALBUMIN 2.6* 2.5*   No results for input(s): LIPASE, AMYLASE in the last 72 hours. Cardiac Enzymes No results for input(s): CKTOTAL, CKMB, CKMBINDEX, TROPONINI in the last 72 hours.  BNP: BNP (last 3 results) Recent Labs    10/07/20 0229  BNP 4,299.0*    ProBNP (last 3 results) No results for input(s): PROBNP in the last 8760 hours.   D-Dimer No results for input(s): DDIMER in the last 72 hours. Hemoglobin A1C No results for input(s): HGBA1C in the last 72 hours. Fasting Lipid Panel No results for input(s): CHOL, HDL, LDLCALC, TRIG, CHOLHDL, LDLDIRECT in the last 72 hours. Thyroid Function Tests No results for input(s): TSH, T4TOTAL, T3FREE, THYROIDAB in the last 72 hours.  Invalid  input(s): FREET3  Other results:   Imaging    No results found.   Medications:     Scheduled Medications: . amiodarone  200 mg Oral BID  . apixaban  5 mg Oral BID  . atorvastatin  20 mg Oral Daily  . Chlorhexidine Gluconate Cloth  6 each Topical Q0600  . insulin aspart  0-6 Units Subcutaneous Q4H  . insulin glargine  10 Units Subcutaneous Daily  . pantoprazole  40 mg Oral Daily  . sodium chloride flush  3 mL Intravenous Q12H  . sodium chloride flush  3 mL Intravenous Q12H  . sodium chloride flush  3 mL Intravenous Q12H  . sodium chloride flush  3 mL Intravenous Q12H    Infusions: . sodium chloride    . sodium chloride      PRN Medications: sodium chloride, sodium chloride, acetaminophen, albuterol, calcium carbonate, dextrose, Gerhardt's  butt cream, heparin sodium (porcine), hydrocortisone cream, lip balm, ondansetron (ZOFRAN) IV, sodium chloride flush, sodium chloride flush, sodium chloride flush  Assessment/Plan   1. A Flutter RVR - Started on amiodarone drip. - Failed attempt at [facility] 3/18 - Now in NSR on po amio On Eliquis,  - Can decrease amio to 200 daily on d/c   2. 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  - Echo this admit w/ severe biventricular HF with EF < 15%.  BNP > 4000 . Suspect tachy-mediated. HS Trop - no trend. No chest pain.   - PICC placed initial Co-ox 40%. Started Milrinone 0.25 converted to NSR then milrinone weaned off - Had [place] 3/24 with elevated filling pressures R>L and preserved cardiac output.  - CO-OX has remained stable off milrinone today 63% - No bb w/ low output  - No arb/dig/spiro with ESRD  - Will not start bidil given marginal BPs and need for HD (and to keep renal perfusion high to hope for possible renal recovery)  - Volume status much improved with HD. Diuretics per renal - Cath last week with no CAD - Hopefully EF improves with maintenance of NSR. Limited echo for EF this am.   3. AKI on CKD Stage IIIa - Creatinine on admit 2.8.  Most recent creatinine was 1.55 back in 03/2020. - started on milrinone for low output - Kidneys didn't recover despite improved CO with milrinone and NSR, likely HD dependent, tolerating HD without milrinone support - HD cath now in place and being set up for outpatient dialysis - hopefully can be discharged today  4. DMII - Hgb A1C 5.9  - On lantus/SSI  5.Upper/Lower Extremity Edema - Dopplers negative for DVT - 2/2 CHF Continue UF removal + unna boots   6. 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  -Continue PPI  7. HTN  -Soft.   8. Hypervolemic Hyponatremia -Restrict free water, dialysis.    Discharge Cardiac Medication Recommendations: Eliquis '5mg'$  BID Amiodarone '200mg'$  daily Atorva 20  daily  Note: Renal recommending torsemide 100 daily   Will arrange follow up in our clinic  Length of Stay: [place], MD  10/27/2020, 8:39 AM  Advanced Heart Failure Team Pager [phone] (M-F; 7a - 5p)    Patient seen and examined with the above-signed Advanced Practice Provider and/or Housestaff. I personally reviewed laboratory data, imaging studies and relevant notes. I independently examined the patient and formulated the important aspects of the plan. I have edited the note to reflect any of my changes or salient points. I  have personally discussed the plan with the patient and/or family.  Feels much better. Denies SOB orthopnea or PND. Remains in NSR. No bleeding on apixaban.  Wants to go home   General:  Sitting up in bed . No resp difficulty HEENT: normal Neck: supple. no JVD. Carotids 2+ bilat; no bruits. No lymphadenopathy or thryomegaly appreciated. Cor: PMI nondisplaced. Regular rate & rhythm. No rubs, gallops or murmurs. Lungs: clear Abdomen: obese soft, nontender, nondistended. No hepatosplenomegaly. No bruits or masses. Good bowel sounds. Extremities: no cyanosis, clubbing, rash, edema Neuro: alert & orientedx3, cranial nerves grossly intact. moves all 4 extremities w/o difficulty. Affect pleasant  Ok to d/c home from cardiac perspective. Echo ordered for this am to look for early EF improvement with restoration of NSR.   Agree with meds as above. Renal recommending torsemide 100 daily to help with fluid.   Will arrange f/u in HF clinic.   Glori Bickers, MD  9:08 AM

## 2020-10-27 NOTE — Progress Notes (Signed)
Occupational Therapy Treatment and Discharge Patient Details Name: Kyle Hicks MRN: [id number] DOB: 1941-07-26 Today's Date: 10/27/2020    History of present illness Pt is a 80 y.o. male admitted 10/07/20 with c/o diarrhea, weakness and fall at home. CXR with enlarged cardiac sillhouette, small R-side pleural effusion. Pt also with tachycardia, HF, AKI. Pt with aflutter RVR 3/17. S/p TEE cardioversion 3/18 showing severe biventricular dysfunction, severe TR, mod-severe MR; DCCV unsuccessful. Course complicated by non-oliguric AKI, likely combo of hypoperfusion secondary to tachycardia and vascular congestion; for renal ultrasound 3/22. Plan RHC and placement of HD cath 3/24. Pt underwent heart cath on 10/24/2020. PMH includes HTN, CHF, DM2, CKD IIIa, PUD, GIB.   OT comments  This 80 yo male admitted with above seen today with focus on LBD and transfers. Pt making progress with both (mod A for pants, min guard A for transfers with RW) and will have his wife at home to A prn and HHOT has been recommended as well. Pt to D/C today so we will sign off.  Follow Up Recommendations  Home health OT;Supervision/Assistance - 24 hour;Other (comment)    Equipment Recommendations  3 in 1 bedside commode       Precautions / Restrictions Precautions Precautions: Fall Restrictions Weight Bearing Restrictions: No       Mobility Bed Mobility Overal bed mobility: Needs Assistance Bed Mobility: Supine to Sit;Sit to Supine     Supine to sit: Supervision;HOB elevated Sit to supine: Supervision;HOB elevated   General bed mobility comments: Pt sitting on EOB upon arrival    Transfers Overall transfer level: Needs assistance Equipment used: Rolling walker (2 wheeled) Transfers: Sit to/from Stand Sit to Stand: Min guard;From elevated surface         General transfer comment: VCs for safe hand placement    Balance Overall balance assessment: Needs assistance Sitting-balance support: Feet  supported;No upper extremity supported Sitting balance-[name] Scale: Fair Sitting balance - Comments: supervision to sit EOB   Standing balance support: During functional activity Standing balance-[name] Scale: Poor Standing balance comment: reliant on BUE support                           ADL either performed or assessed with clinical judgement   ADL Overall ADL's : Needs assistance/impaired                     Lower Body Dressing: Moderate assistance Lower Body Dressing Details (indicate cue type and reason): with min guard A sit<>stand for pants Toilet Transfer: RW;Ambulation;Min guard Toilet Transfer Details (indicate cue type and reason): Bed>to wheelchair 3 feet away                 Vision Patient Visual Report: No change from baseline            Cognition Arousal/Alertness: Awake/alert Behavior During Therapy: WFL for tasks assessed/performed Overall Cognitive Status: Within Functional Limits for tasks assessed                                 General Comments: pt very flat but feel this is his baseline, sometimes requires increased time to follow commands but likely due to [name] Comments VSS on RA; Sp02 in mid-high 90s on RA, no A-fib.    Pertinent Vitals/ Pain  Pain Assessment: No/denies pain         Frequency  Min 2X/week        Progress Toward Goals  OT Goals(current goals can now be found in the care plan section)  Progress towards OT goals: Progressing toward goals  Acute Rehab OT Goals Patient Stated Goal: to go home today OT Goal Formulation: With patient Time For Goal Achievement: 10/25/20 Potential to Achieve Goals: Good  Plan Discharge plan remains appropriate       AM-PAC OT "6 Clicks" Daily Activity     Outcome Measure   Help from another person eating meals?: None Help from another person taking care of personal grooming?: A Little Help from another person toileting,  which includes using toliet, bedpan, or urinal?: A Lot Help from another person bathing (including washing, rinsing, drying)?: A Little Help from another person to put on and taking off regular upper body clothing?: A Little Help from another person to put on and taking off regular lower body clothing?: A Lot 6 Click Score: 17    End of Session Equipment Utilized During Treatment: Rolling walker  OT Visit Diagnosis: Unsteadiness on feet (R26.81);Muscle weakness (generalized) (M62.81)   Activity Tolerance Patient tolerated treatment well   Patient Left  (in wheelchair getting ready to be discharged)           Time: [id number] OT Time Calculation (min): 13 min  Charges: OT General Charges $OT Visit: 1 Visit OT Treatments $Self Care/Home Management : 8-22 mins  Kyle Hicks, OTR/L Acute [employer] Pager [phone] Office [phone]     Kyle Hicks 10/27/2020, 1:35 PM

## 2020-10-27 NOTE — Progress Notes (Signed)
D/C instructions printed and placed in packet at nurse's station. Tele removed, tolerated well. Will put in IV team consult for PICC removal prior to PTAR transport.

## 2020-10-27 NOTE — Progress Notes (Signed)
[facility] Progress Note    Assessment/ Plan:   Assessment/Recommendations: Kyle Hicks is a/an 80 y.o. 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 Fib/A. Flutter, CKD Stage 3a, T2DM, HTN, obesity.  1. AKI: Likely a combination of hypoperfusion secondary to tachycardia and vascular congestion secondary to cardiorenal syndrome in setting of new EF 10-15%.  -s/p temp HD line 3/24, had UF 3/25 and 3/26 for diuretic refractory volume overload; [facility] 10/23/20 -Making some urine, would d/c on torsemide 100 daily to help maintain euvolemia  - Will plan for ongoing RRT while monitoring for renal recovery - HD last 4/2, next planned 4/5 outpt.  -CLIP AKI - baseline wasn't terrible and if his heart recovers function perhaps we will see improved renal function.  Has outpt HD arranged at [facility] T[facility] to start 10/28/20 -Contacted IR to place anchor stitch for HD cath - he has one and no intervention needed -Continue to monitor for improvement in renal function and avoid further insults by avoiding nephrotoxins, avoiding hypotension. Cont to hold RAAS inhibition fo now. -hold on perm acces given hopeful for recovery  -Monitor Daily I/Os, Daily weight   2. 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  UACR initially normal in 2018; markedly elevated in 2021 at 612. Additional differential includes obesity related GN.  Baseline creatinine was previously 1.2 - 1.6.   3. Acute Biventricular Heart Failure: Previous hx of HFpEF with progression to HFrEF with EF of 10-15%; suspected to be in the setting of A. Fib with RVR.  -HF following -- off milrinone now and cardiology hopeful with A fib therapy his EF will rebound in the coming weeks/months.  -LHC unrevealing -volume management per above  4. Hyponatremia (hypervolemic) -improved with UF,  currently mildly hyponatremic; cont fluid restriction/na restriction, UF  5. A. Fib with RVR: DCCV on 3/18. In NSR now. Currently on Amiodarone. Per cardiology.   6. Type 2 Diabetes Mellitus: -per primary  7. Hx of HTN: Previously on HCTZ, Nifedipine and Ramipril. Currently all three are being held. BP has been soft during this admission. 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 At that time, patient refused further evaluation. LFTs WNL on admission.  Defer to primary  [facility] for d/c from nephrology perspective   Subjective:    Has a couple of loose stools today.  Eager for discharge.  Has been set up at [facility] TTS   Objective:   BP 120/69   Pulse 72   Temp (!) 97.2 F (36.2 C) (Oral)   Resp 19   Ht '5\' 6"'$  (1.676 m)   Wt 104.8 kg   SpO2 100%   BMI 37.29 kg/m   Intake/Output Summary (Last 24 hours) at 10/27/2020 1105 Last data filed at 10/27/2020 0815 Gross per 24 hour  Intake 597 ml  Output 75 ml  Net 522 ml   Weight change: -2 kg  Physical Exam: [id number] elderly gentleman, sitting up in bed CVS:RRR. No murmurs Resp:No wheezing or rales. No respiratory distress.  Abd: obese, soft, nt  Ext: 1+ pitting edema of 4 extr, legs are wrapped Neuro: awake, interactive, speech clear and coherent Access:  [facility] TDC c/d/i  Imaging: No results found. Labs: BMET Recent Labs  Lab 10/21/20 0419 10/22/20 0412 10/23/20 0500 10/24/20 0501 10/24/20 1016  10/24/20 1024 10/25/20 1342 10/27/20 0515  NA 130* 129* 133* 133* 137 133*  133* 133* 134*  K 3.7 3.5 3.7 3.6 3.4* 3.9  3.9 3.7 3.4*  CL 97* 95* 99 100  --   --  100 99  CO2 '23 22 25 23  '$ --   --  23 24  GLUCOSE 95 96 81 81  --   --  195* 94  BUN 45* 50* 35* 39*  --   --  46* 33*  CREATININE 4.80* 5.45* 4.40* 5.25*  --   --  6.17* 5.42*  CALCIUM 7.9* 8.1* 8.2* 8.0*  --   --  8.3* 8.1*  PHOS  --   --   --   --   --   --  2.5 2.2*   CBC Recent Labs   Lab 10/23/20 0500 10/24/20 0501 10/24/20 1016 10/24/20 1024 10/25/20 0451 10/25/20 1343  WBC 5.2 5.6  --   --  4.4 5.4  HGB 10.1* 9.9* 10.2* 10.9*  11.2* 10.0* 10.5*  HCT 30.1* 29.7* 30.0* 32.0*  33.0* 30.0* 31.3*  MCV 82.9 83.7  --   --  82.6 83.9  PLT 135* 136*  --   --  142* 154    Medications:    . amiodarone  200 mg Oral BID  . apixaban  5 mg Oral BID  . atorvastatin  20 mg Oral Daily  . Chlorhexidine Gluconate Cloth  6 each Topical Q0600  . insulin aspart  0-6 Units Subcutaneous Q4H  . insulin glargine  10 Units Subcutaneous Daily  . pantoprazole  40 mg Oral Daily  . sodium chloride flush  3 mL Intravenous Q12H  . sodium chloride flush  3 mL Intravenous Q12H  . sodium chloride flush  3 mL Intravenous Q12H  . sodium chloride flush  3 mL Intravenous Q12H   10/27/2020, 11:05 AM

## 2020-10-27 NOTE — Plan of Care (Signed)

## 2020-10-28 DIAGNOSIS — N179 Acute kidney failure, unspecified: Secondary | ICD-10-CM | POA: Diagnosis not present

## 2020-10-28 DIAGNOSIS — Z23 Encounter for immunization: Secondary | ICD-10-CM | POA: Diagnosis not present

## 2020-10-28 DIAGNOSIS — D509 Iron deficiency anemia, unspecified: Secondary | ICD-10-CM | POA: Diagnosis not present

## 2020-10-28 DIAGNOSIS — Z992 Dependence on renal dialysis: Secondary | ICD-10-CM | POA: Diagnosis not present

## 2020-10-28 DIAGNOSIS — I4892 Unspecified atrial flutter: Secondary | ICD-10-CM | POA: Diagnosis not present

## 2020-10-28 DIAGNOSIS — D689 Coagulation defect, unspecified: Secondary | ICD-10-CM | POA: Diagnosis not present

## 2020-10-28 DIAGNOSIS — N2581 Secondary hyperparathyroidism of renal origin: Secondary | ICD-10-CM | POA: Diagnosis not present

## 2020-10-28 DIAGNOSIS — N186 End stage renal disease: Secondary | ICD-10-CM | POA: Diagnosis not present

## 2020-10-28 DIAGNOSIS — I509 Heart failure, unspecified: Secondary | ICD-10-CM | POA: Diagnosis not present

## 2020-10-28 DIAGNOSIS — N178 Other acute kidney failure: Secondary | ICD-10-CM | POA: Diagnosis not present

## 2020-10-30 DIAGNOSIS — N178 Other acute kidney failure: Secondary | ICD-10-CM | POA: Diagnosis not present

## 2020-10-30 DIAGNOSIS — N2581 Secondary hyperparathyroidism of renal origin: Secondary | ICD-10-CM | POA: Diagnosis not present

## 2020-10-30 DIAGNOSIS — Z992 Dependence on renal dialysis: Secondary | ICD-10-CM | POA: Diagnosis not present

## 2020-10-30 DIAGNOSIS — D509 Iron deficiency anemia, unspecified: Secondary | ICD-10-CM | POA: Diagnosis not present

## 2020-10-30 DIAGNOSIS — D689 Coagulation defect, unspecified: Secondary | ICD-10-CM | POA: Diagnosis not present

## 2020-10-30 DIAGNOSIS — Z23 Encounter for immunization: Secondary | ICD-10-CM | POA: Diagnosis not present

## 2020-10-31 DIAGNOSIS — E1122 Type 2 diabetes mellitus with diabetic chronic kidney disease: Secondary | ICD-10-CM | POA: Diagnosis not present

## 2020-10-31 DIAGNOSIS — E876 Hypokalemia: Secondary | ICD-10-CM | POA: Diagnosis not present

## 2020-10-31 DIAGNOSIS — K3189 Other diseases of stomach and duodenum: Secondary | ICD-10-CM | POA: Diagnosis not present

## 2020-10-31 DIAGNOSIS — K7460 Unspecified cirrhosis of liver: Secondary | ICD-10-CM | POA: Diagnosis not present

## 2020-10-31 DIAGNOSIS — G9340 Encephalopathy, unspecified: Secondary | ICD-10-CM | POA: Diagnosis not present

## 2020-10-31 DIAGNOSIS — E871 Hypo-osmolality and hyponatremia: Secondary | ICD-10-CM | POA: Diagnosis not present

## 2020-10-31 DIAGNOSIS — D631 Anemia in chronic kidney disease: Secondary | ICD-10-CM | POA: Diagnosis not present

## 2020-10-31 DIAGNOSIS — E782 Mixed hyperlipidemia: Secondary | ICD-10-CM | POA: Diagnosis not present

## 2020-10-31 DIAGNOSIS — N179 Acute kidney failure, unspecified: Secondary | ICD-10-CM | POA: Diagnosis not present

## 2020-10-31 DIAGNOSIS — I110 Hypertensive heart disease with heart failure: Secondary | ICD-10-CM | POA: Diagnosis not present

## 2020-10-31 DIAGNOSIS — Z7901 Long term (current) use of anticoagulants: Secondary | ICD-10-CM | POA: Diagnosis not present

## 2020-10-31 DIAGNOSIS — N1831 Chronic kidney disease, stage 3a: Secondary | ICD-10-CM | POA: Diagnosis not present

## 2020-10-31 DIAGNOSIS — I5023 Acute on chronic systolic (congestive) heart failure: Secondary | ICD-10-CM | POA: Diagnosis not present

## 2020-10-31 DIAGNOSIS — I272 Pulmonary hypertension, unspecified: Secondary | ICD-10-CM | POA: Diagnosis not present

## 2020-10-31 DIAGNOSIS — I4892 Unspecified atrial flutter: Secondary | ICD-10-CM | POA: Diagnosis not present

## 2020-10-31 DIAGNOSIS — Z7984 Long term (current) use of oral hypoglycemic drugs: Secondary | ICD-10-CM | POA: Diagnosis not present

## 2020-10-31 DIAGNOSIS — Z992 Dependence on renal dialysis: Secondary | ICD-10-CM | POA: Diagnosis not present

## 2020-10-31 DIAGNOSIS — Z6830 Body mass index (BMI) 30.0-30.9, adult: Secondary | ICD-10-CM | POA: Diagnosis not present

## 2020-10-31 DIAGNOSIS — E872 Acidosis: Secondary | ICD-10-CM | POA: Diagnosis not present

## 2020-10-31 DIAGNOSIS — I5030 Unspecified diastolic (congestive) heart failure: Secondary | ICD-10-CM | POA: Diagnosis not present

## 2020-10-31 DIAGNOSIS — I4891 Unspecified atrial fibrillation: Secondary | ICD-10-CM | POA: Diagnosis not present

## 2020-10-31 DIAGNOSIS — I5082 Biventricular heart failure: Secondary | ICD-10-CM | POA: Diagnosis not present

## 2020-11-01 DIAGNOSIS — N178 Other acute kidney failure: Secondary | ICD-10-CM | POA: Diagnosis not present

## 2020-11-01 DIAGNOSIS — Z23 Encounter for immunization: Secondary | ICD-10-CM | POA: Diagnosis not present

## 2020-11-01 DIAGNOSIS — D509 Iron deficiency anemia, unspecified: Secondary | ICD-10-CM | POA: Diagnosis not present

## 2020-11-01 DIAGNOSIS — D689 Coagulation defect, unspecified: Secondary | ICD-10-CM | POA: Diagnosis not present

## 2020-11-01 DIAGNOSIS — Z992 Dependence on renal dialysis: Secondary | ICD-10-CM | POA: Diagnosis not present

## 2020-11-01 DIAGNOSIS — N2581 Secondary hyperparathyroidism of renal origin: Secondary | ICD-10-CM | POA: Diagnosis not present

## 2020-11-03 DIAGNOSIS — E1101 Type 2 diabetes mellitus with hyperosmolarity with coma: Secondary | ICD-10-CM | POA: Diagnosis not present

## 2020-11-03 DIAGNOSIS — I4892 Unspecified atrial flutter: Secondary | ICD-10-CM | POA: Diagnosis not present

## 2020-11-03 DIAGNOSIS — Z1389 Encounter for screening for other disorder: Secondary | ICD-10-CM | POA: Diagnosis not present

## 2020-11-03 DIAGNOSIS — Z0001 Encounter for general adult medical examination with abnormal findings: Secondary | ICD-10-CM | POA: Diagnosis not present

## 2020-11-03 DIAGNOSIS — Z6832 Body mass index (BMI) 32.0-32.9, adult: Secondary | ICD-10-CM | POA: Diagnosis not present

## 2020-11-03 DIAGNOSIS — I509 Heart failure, unspecified: Secondary | ICD-10-CM | POA: Diagnosis not present

## 2020-11-03 DIAGNOSIS — N179 Acute kidney failure, unspecified: Secondary | ICD-10-CM | POA: Diagnosis not present

## 2020-11-04 DIAGNOSIS — N2581 Secondary hyperparathyroidism of renal origin: Secondary | ICD-10-CM | POA: Diagnosis not present

## 2020-11-04 DIAGNOSIS — D689 Coagulation defect, unspecified: Secondary | ICD-10-CM | POA: Diagnosis not present

## 2020-11-04 DIAGNOSIS — N179 Acute kidney failure, unspecified: Secondary | ICD-10-CM | POA: Diagnosis not present

## 2020-11-04 DIAGNOSIS — Z992 Dependence on renal dialysis: Secondary | ICD-10-CM | POA: Diagnosis not present

## 2020-11-04 DIAGNOSIS — N178 Other acute kidney failure: Secondary | ICD-10-CM | POA: Diagnosis not present

## 2020-11-04 DIAGNOSIS — D509 Iron deficiency anemia, unspecified: Secondary | ICD-10-CM | POA: Diagnosis not present

## 2020-11-05 ENCOUNTER — Encounter: Payer: Self-pay | Admitting: *Deleted

## 2020-11-05 ENCOUNTER — Other Ambulatory Visit: Payer: Self-pay | Admitting: *Deleted

## 2020-11-05 DIAGNOSIS — I509 Heart failure, unspecified: Secondary | ICD-10-CM

## 2020-11-05 DIAGNOSIS — L89159 Pressure ulcer of sacral region, unspecified stage: Secondary | ICD-10-CM | POA: Diagnosis not present

## 2020-11-05 DIAGNOSIS — N186 End stage renal disease: Secondary | ICD-10-CM | POA: Diagnosis not present

## 2020-11-05 DIAGNOSIS — Z992 Dependence on renal dialysis: Secondary | ICD-10-CM | POA: Diagnosis not present

## 2020-11-05 NOTE — Patient Outreach (Signed)
[place] [facility]) Care Management  [place]  11/05/2020   Kyle Hicks Dec 11, 1940 [id number]   Referral Date: 4/12 Referral Source: Hospital liaison Referral Reason: Post hospital discharge for CHF Insurance: [place] attempt #1, successful to wife.  Identity verified.  This care manager introduced self and stated purpose of call.  [facility] care management services explained.    Social: Lives with wife, who provides assistance with all ADL/IADL's.  They do not monitor blood pressure, weight, or blood sugar at home as they do not have any equipment.  She will call PCP office to ask for glucose monitor, will purchase blood pressure machine and scale.  [facility] is active, has PT, OT, and nursing.  Using walker for ambulation, state he is increasing strength.    Conditions: Per chart, has history of HF, HTN A-fib/flutter, DM (A1C - 5.9), CKD on dialysis, and HLD.    Medications: Reviewed with wife, denies need for transportation assistance.    Appointments: Followed up with PCP on 4/11, will see cardiology on 4/18.  Son in law will provide transportation but state this may be an issue going forward as the son in law is on disability and may not be able to transport him.     Encounter Medications:  Outpatient Encounter Medications as of 11/05/2020  Medication Sig  . amiodarone (PACERONE) 200 MG tablet Take 1 tablet (200 mg total) by mouth daily.  Marland Kitchen apixaban (ELIQUIS) 5 MG TABS tablet Take 1 tablet (5 mg total) by mouth 2 (two) times daily.  . Artificial Tear Ointment (DRY EYES OP) Apply 1 drop to eye daily as needed (for dry eyes).  Marland Kitchen atorvastatin (LIPITOR) 20 MG tablet Take 1 tablet (20 mg total) by mouth daily.  . blood glucose meter kit and supplies KIT Dispense based on patient and insurance preference. Use up to 2 times daily as directed. (FOR ICD-10 E11.65)  . glipiZIDE (GLUCOTROL) 5 MG tablet Take 1 tablet by mouth once daily with breakfast (Patient  taking differently: Take 5 mg by mouth daily before breakfast.)  . glucose blood (ACCU-CHEK GUIDE) test strip Use as instructed bid E11.65  . Homeopathic Products (AVENOC) OINT Apply 1 application topically daily as needed (foot care).  . hydrocortisone cream 1 % Apply 1 application topically 3 (three) times daily as needed for itching (minor skin irritation).  . Lancets (ACCU-CHEK MULTICLIX) lancets Use as instructed bid. E11.65  . pantoprazole (PROTONIX) 40 MG tablet Take 1 tablet (40 mg total) by mouth daily.  Marland Kitchen torsemide (DEMADEX) 100 MG tablet Take 1 tablet (100 mg total) by mouth daily.   No facility-administered encounter medications on file as of 11/05/2020.    Functional Status:  In your present state of health, do you have any difficulty performing the following activities: 10/07/2020  Hearing? Y  Vision? N  Difficulty concentrating or making decisions? N  Walking or climbing stairs? Y  Dressing or bathing? N  Doing errands, shopping? N  Some recent data might be hidden    Fall/Depression Screening: Fall Risk  11/05/2020 09/19/2018 05/19/2018  Falls in the past year? 1 0 No  Number falls in past yr: 0 - -  Injury with Fall? 0 - -  Follow up - Falls evaluation completed -   PHQ 2/9 Scores 11/05/2020 05/19/2018 02/13/2018 06/28/2017 03/14/2017 11/03/2016 08/03/2016  PHQ - 2 Score - 0 0 0 0 0 0  Exception Documentation Other- indicate reason in comment box - - - - - -  Assessment:  Goals Addressed            This Visit's Progress   . Track and Manage Fluids and Swelling-Heart Failure       Timeframe:  Short-Term Goal Priority:  High Start Date:       4/13                      Expected End Date: 5/13                      Follow Up Date 4/27    - keep legs up while sitting - track weight in diary - weigh myself daily    Why is this important?    It is important to check your weight daily and watch how much salt and liquids you have.   It will help you to manage  your heart failure.    Notes:     . Track and Manage Symptoms-Heart Failure       Timeframe:  Long-Range Goal Priority:  High Start Date:         4/13                    Expected End Date:    6/13                    Follow Up Date 4/27   - begin a heart failure diary - develop a rescue plan - know when to call the doctor    Why is this important?    You will be able to handle your symptoms better if you keep track of them.   Making some simple changes to your lifestyle will help.   Eating healthy is one thing you can do to take good care of yourself.    Notes:        Plan:  Follow-up:  Patient agrees to Care Plan and Follow-up.  Will send member [facility] calendar book with blood pressure/weight logs. Will send HF education.  Will place referral for Care Guide to follow up on transportation assistance.  Will notify PCP of [facility] involvement.  Will follow up within the next 2 weeks.  Valente David, [place], MSN [place] [phone]

## 2020-11-05 NOTE — Patient Instructions (Signed)
Heart Failure Action Plan A heart failure action plan helps you understand what to do when you have symptoms of heart failure. Your action plan is a color-coded plan that lists the symptoms to watch for and indicates what actions to take.  If you have symptoms in the red zone, you need medical care right away.  If you have symptoms in the yellow zone, you are having problems.  If you have symptoms in the green zone, you are doing well. Follow the plan that was created by you and your health care provider. Review your plan each time you visit your health care provider. Red zone These signs and symptoms mean you should get medical help right away:  You have trouble breathing when resting.  You have a dry cough that is getting worse.  You have swelling or pain in your legs or abdomen that is getting worse.  You suddenly gain more than 2-3 lb (0.9-1.4 kg) in 24 hours, or more than 5 lb (2.3 kg) in a week. This amount may be more or less depending on your condition.  You have trouble staying awake or you feel confused.  You have chest pain.  You do not have an appetite.  You pass out.  You have worsening sadness or depression. If you have any of these symptoms, call your local emergency services (911 in the [place] right away. Do not drive yourself to the hospital.   Yellow zone These signs and symptoms mean your condition may be getting worse and you should make some changes:  You have trouble breathing when you are active, or you need to sleep with your head raised on extra pillows to help you breathe.  You have swelling in your legs or abdomen.  You gain 2-3 lb (0.9-1.4 kg) in 24 hours, or 5 lb (2.3 kg) in a week. This amount may be more or less depending on your condition.  You get tired easily.  You have trouble sleeping.  You have a dry cough. If you have any of these symptoms:  Contact your health care provider within the next day.  Your health care provider may adjust  your medicines.   Green zone These signs mean you are doing well and can continue what you are doing:  You do not have shortness of breath.  You have very little swelling or no new swelling.  Your weight is stable (no gain or loss).  You have a normal activity level.  You do not have chest pain or any other new symptoms.   Follow these instructions at home:  Take over-the-counter and prescription medicines only as told by your health care provider.  Weigh yourself daily. Your target weight is __________ lb (__________ kg). ? Call your health care provider if you gain more than __________ lb (__________ kg) in 24 hours, or more than __________ lb (__________ kg) in a week. ? Health care provider name: _____________________________________________________ ? Health care provider phone number: _____________________________________________________  Eat a heart-healthy diet. Work with a diet and nutrition specialist (dietitian) to create an eating plan that is best for you.  Keep all follow-up visits. This is important. Where to find more information  American Heart Association: www.heart.org Summary  A heart failure action plan helps you understand what to do when you have symptoms of heart failure.  Follow the action plan that was created by you and your health care provider.  Get help right away if you have any symptoms in the red zone.  This information is not intended to replace advice given to you by your health care provider. Make sure you discuss any questions you have with your health care provider. Document Revised: 02/25/2020 Document Reviewed: 02/25/2020 Elsevier Patient Education  2021 [employer].

## 2020-11-06 DIAGNOSIS — D689 Coagulation defect, unspecified: Secondary | ICD-10-CM | POA: Diagnosis not present

## 2020-11-06 DIAGNOSIS — D509 Iron deficiency anemia, unspecified: Secondary | ICD-10-CM | POA: Diagnosis not present

## 2020-11-06 DIAGNOSIS — Z992 Dependence on renal dialysis: Secondary | ICD-10-CM | POA: Diagnosis not present

## 2020-11-06 DIAGNOSIS — N178 Other acute kidney failure: Secondary | ICD-10-CM | POA: Diagnosis not present

## 2020-11-06 DIAGNOSIS — N2581 Secondary hyperparathyroidism of renal origin: Secondary | ICD-10-CM | POA: Diagnosis not present

## 2020-11-08 DIAGNOSIS — Z992 Dependence on renal dialysis: Secondary | ICD-10-CM | POA: Diagnosis not present

## 2020-11-08 DIAGNOSIS — N178 Other acute kidney failure: Secondary | ICD-10-CM | POA: Diagnosis not present

## 2020-11-08 DIAGNOSIS — D689 Coagulation defect, unspecified: Secondary | ICD-10-CM | POA: Diagnosis not present

## 2020-11-08 DIAGNOSIS — N2581 Secondary hyperparathyroidism of renal origin: Secondary | ICD-10-CM | POA: Diagnosis not present

## 2020-11-08 DIAGNOSIS — D509 Iron deficiency anemia, unspecified: Secondary | ICD-10-CM | POA: Diagnosis not present

## 2020-11-10 ENCOUNTER — Telehealth: Payer: Self-pay

## 2020-11-10 ENCOUNTER — Encounter (HOSPITAL_COMMUNITY): Payer: Medicare HMO

## 2020-11-10 NOTE — Telephone Encounter (Signed)
   Telephone encounter was:  Unsuccessful.  11/10/2020 Name: Kyle Hicks MRN: [id number] DOB: 1941-07-26  Unsuccessful outbound call made today to assist with:  Transportation Needs   Outreach Attempt:  1st Attempt  A HIPAA compliant voice message was left requesting a return call.  Instructed patient to call back at [phone].  Muhannad Bignell, AAS Paralegal, [place] . Embedded Care Coordination [facility] Health  Care Management  [address], [place] [address] ??[email or web address]  ?? [phone]   [email or web address]

## 2020-11-11 DIAGNOSIS — E039 Hypothyroidism, unspecified: Secondary | ICD-10-CM | POA: Diagnosis not present

## 2020-11-11 DIAGNOSIS — N2581 Secondary hyperparathyroidism of renal origin: Secondary | ICD-10-CM | POA: Diagnosis not present

## 2020-11-11 DIAGNOSIS — N178 Other acute kidney failure: Secondary | ICD-10-CM | POA: Diagnosis not present

## 2020-11-11 DIAGNOSIS — Z23 Encounter for immunization: Secondary | ICD-10-CM | POA: Diagnosis not present

## 2020-11-11 DIAGNOSIS — D689 Coagulation defect, unspecified: Secondary | ICD-10-CM | POA: Diagnosis not present

## 2020-11-11 DIAGNOSIS — D509 Iron deficiency anemia, unspecified: Secondary | ICD-10-CM | POA: Diagnosis not present

## 2020-11-11 DIAGNOSIS — Z992 Dependence on renal dialysis: Secondary | ICD-10-CM | POA: Diagnosis not present

## 2020-11-13 DIAGNOSIS — Z992 Dependence on renal dialysis: Secondary | ICD-10-CM | POA: Diagnosis not present

## 2020-11-13 DIAGNOSIS — N179 Acute kidney failure, unspecified: Secondary | ICD-10-CM | POA: Diagnosis not present

## 2020-11-13 DIAGNOSIS — D689 Coagulation defect, unspecified: Secondary | ICD-10-CM | POA: Diagnosis not present

## 2020-11-13 DIAGNOSIS — N178 Other acute kidney failure: Secondary | ICD-10-CM | POA: Diagnosis not present

## 2020-11-13 DIAGNOSIS — Z23 Encounter for immunization: Secondary | ICD-10-CM | POA: Diagnosis not present

## 2020-11-13 DIAGNOSIS — N2581 Secondary hyperparathyroidism of renal origin: Secondary | ICD-10-CM | POA: Diagnosis not present

## 2020-11-13 DIAGNOSIS — E039 Hypothyroidism, unspecified: Secondary | ICD-10-CM | POA: Diagnosis not present

## 2020-11-13 DIAGNOSIS — D509 Iron deficiency anemia, unspecified: Secondary | ICD-10-CM | POA: Diagnosis not present

## 2020-11-14 ENCOUNTER — Telehealth: Payer: Self-pay

## 2020-11-14 NOTE — Telephone Encounter (Signed)
   Telephone encounter was:  Successful.  11/14/2020 Name: KARAN BOSQUE MRN: [id number] DOB: Jul 24, 1941  SOHRAB VANNELLI is a 80 y.o. year old male who is a primary care patient of Sharilyn Sites, MD . The community resource team was consulted for assistance with Transportation Needs   Care guide performed the following interventions: Your request for transportation for [place] with a 2:00pm appointment on 05.16.2022 has been received and acknowledged. After reviewing your email, I immediately setup the ride on our RL, and then submitted it to our Dispatch Request Log since. I will be calling them to ensure they are aware that the transportation was requested on their behalf. Christian Vilsaint [phone].  Follow Up Plan:  Care guide will follow up with patient by phone over the next 7 days  Guneet Delpino, AAS Paralegal, [place] . Embedded Care Coordination [facility] Health  Care Management  [address], [place] [address] ??[email or web address]  ?? [phone]   [email or web address]

## 2020-11-14 NOTE — Telephone Encounter (Signed)
   Telephone encounter was:  Successful.  11/14/2020 Name: Kyle Hicks MRN: [id number] DOB: 1940/10/05  Kyle Hicks is a 80 y.o. year old male who is a primary care patient of Sharilyn Sites, MD . The community resource team was consulted for assistance with Transportation Needs   Care guide performed the following interventions: Patient provided with information about care guide support team and interviewed to confirm resource needs Obtained verbal consent to place patient referral to [facility] transportation Spoke with patient's wife Kyle Hicks and gained permission to send a request to [place] for 12/08/20 2pm appointment at [facility] . [facility] does not have any public transportation that travels out of the county unless patient has Medicaid..  Follow Up Plan:  Care guide will follow up with patient by phone over the next 7 days  Shaynah Hund, AAS Paralegal, [place] . Embedded Care Coordination [facility] Health  Care Management  [address], [place] [address] ??[email or web address]  ?? [phone]   [email or web address]

## 2020-11-15 DIAGNOSIS — N178 Other acute kidney failure: Secondary | ICD-10-CM | POA: Diagnosis not present

## 2020-11-15 DIAGNOSIS — E039 Hypothyroidism, unspecified: Secondary | ICD-10-CM | POA: Diagnosis not present

## 2020-11-15 DIAGNOSIS — D509 Iron deficiency anemia, unspecified: Secondary | ICD-10-CM | POA: Diagnosis not present

## 2020-11-15 DIAGNOSIS — N2581 Secondary hyperparathyroidism of renal origin: Secondary | ICD-10-CM | POA: Diagnosis not present

## 2020-11-15 DIAGNOSIS — Z992 Dependence on renal dialysis: Secondary | ICD-10-CM | POA: Diagnosis not present

## 2020-11-15 DIAGNOSIS — Z23 Encounter for immunization: Secondary | ICD-10-CM | POA: Diagnosis not present

## 2020-11-15 DIAGNOSIS — D689 Coagulation defect, unspecified: Secondary | ICD-10-CM | POA: Diagnosis not present

## 2020-11-17 ENCOUNTER — Telehealth: Payer: Self-pay

## 2020-11-17 NOTE — Telephone Encounter (Signed)
   Telephone encounter was:  Successful.  11/17/2020 Name: TOU ROBUSTELLI MRN: [id number] DOB: Jan 05, 1941  ISIDORE FRANC is a 80 y.o. year old male who is a primary care patient of Sharilyn Sites, MD . The community resource team was consulted for assistance with Transportation Needs   Care guide performed the following interventions: Follow up call placed to community resources to determine status of patients referral Follow up call placed to the patient to discuss status of referral Spoke with patient's wife Abba Porchia to let her know I have received confirmation from [facility] transportation and they should expect to be contacted on Friday 12-05-20.  .  Follow Up Plan:  No further follow up planned at this time. The patient has been provided with needed resources.  Beverly Suriano, AAS Paralegal, [place] . Embedded Care Coordination [facility] Health  Care Management  [address], [place] [address] ??[email or web address]  ?? [phone]   [email or web address]

## 2020-11-18 DIAGNOSIS — Z992 Dependence on renal dialysis: Secondary | ICD-10-CM | POA: Diagnosis not present

## 2020-11-18 DIAGNOSIS — D689 Coagulation defect, unspecified: Secondary | ICD-10-CM | POA: Diagnosis not present

## 2020-11-18 DIAGNOSIS — D509 Iron deficiency anemia, unspecified: Secondary | ICD-10-CM | POA: Diagnosis not present

## 2020-11-18 DIAGNOSIS — N2581 Secondary hyperparathyroidism of renal origin: Secondary | ICD-10-CM | POA: Diagnosis not present

## 2020-11-18 DIAGNOSIS — N178 Other acute kidney failure: Secondary | ICD-10-CM | POA: Diagnosis not present

## 2020-11-19 ENCOUNTER — Other Ambulatory Visit: Payer: Self-pay | Admitting: *Deleted

## 2020-11-19 NOTE — Patient Outreach (Signed)
[facility]) Care Management  11/19/2020  JASKARN SUIT July 17, 1941 [id number]   Outgoing call placed to member/wife, no answer, HIPAA compliant voice message left.  Will follow up within the next 3-4 business days.  Valente David, [place], MSN [place] [phone]

## 2020-11-20 DIAGNOSIS — D631 Anemia in chronic kidney disease: Secondary | ICD-10-CM | POA: Diagnosis not present

## 2020-11-20 DIAGNOSIS — E1122 Type 2 diabetes mellitus with diabetic chronic kidney disease: Secondary | ICD-10-CM | POA: Diagnosis not present

## 2020-11-20 DIAGNOSIS — D689 Coagulation defect, unspecified: Secondary | ICD-10-CM | POA: Diagnosis not present

## 2020-11-20 DIAGNOSIS — N178 Other acute kidney failure: Secondary | ICD-10-CM | POA: Diagnosis not present

## 2020-11-20 DIAGNOSIS — D509 Iron deficiency anemia, unspecified: Secondary | ICD-10-CM | POA: Diagnosis not present

## 2020-11-20 DIAGNOSIS — I4892 Unspecified atrial flutter: Secondary | ICD-10-CM | POA: Diagnosis not present

## 2020-11-20 DIAGNOSIS — N179 Acute kidney failure, unspecified: Secondary | ICD-10-CM | POA: Diagnosis not present

## 2020-11-20 DIAGNOSIS — N1831 Chronic kidney disease, stage 3a: Secondary | ICD-10-CM | POA: Diagnosis not present

## 2020-11-20 DIAGNOSIS — N2581 Secondary hyperparathyroidism of renal origin: Secondary | ICD-10-CM | POA: Diagnosis not present

## 2020-11-20 DIAGNOSIS — Z992 Dependence on renal dialysis: Secondary | ICD-10-CM | POA: Diagnosis not present

## 2020-11-22 DIAGNOSIS — D689 Coagulation defect, unspecified: Secondary | ICD-10-CM | POA: Diagnosis not present

## 2020-11-22 DIAGNOSIS — D509 Iron deficiency anemia, unspecified: Secondary | ICD-10-CM | POA: Diagnosis not present

## 2020-11-22 DIAGNOSIS — N2581 Secondary hyperparathyroidism of renal origin: Secondary | ICD-10-CM | POA: Diagnosis not present

## 2020-11-22 DIAGNOSIS — N178 Other acute kidney failure: Secondary | ICD-10-CM | POA: Diagnosis not present

## 2020-11-22 DIAGNOSIS — Z992 Dependence on renal dialysis: Secondary | ICD-10-CM | POA: Diagnosis not present

## 2020-11-24 ENCOUNTER — Other Ambulatory Visit: Payer: Self-pay | Admitting: *Deleted

## 2020-11-24 NOTE — Patient Outreach (Signed)
[facility]) Care Management  11/24/2020  SYEED RATHGEBER 1941/04/25 [id number]   Outreach attempt #2, successful to wife.  Report member is improving, doing more independently.  Continues to work with home health for PT/OT, daily monitoring of vitals.  Denies any urgent concerns, encouraged to contact this care manager with questions.  Agrees to follow up within the next month.  Goals Addressed            This Visit's Progress   . Track and Manage Fluids and Swelling-Heart Failure   On track    Timeframe:  Short-Term Goal Priority:  High Start Date:       4/13                      Expected End Date: 5/13                      Barriers: Knowledge    - keep legs up while sitting - track weight in diary - weigh myself daily    Why is this important?    It is important to check your weight daily and watch how much salt and liquids you have.   It will help you to manage your heart failure.    Notes:   5/2 - Weight today was 187.2, yesterday was 187.4 pounds.  Educated on when to contact provider    . Track and Manage Symptoms-Heart Failure   On track    Timeframe:  Long-Range Goal Priority:  High Start Date:         4/13                    Expected End Date:    6/13                    Barriers: Knowledge   - begin a heart failure diary - develop a rescue plan - know when to call the doctor    Why is this important?    You will be able to handle your symptoms better if you keep track of them.   Making some simple changes to your lifestyle will help.   Eating healthy is one thing you can do to take good care of yourself.    Notes:   5/2 - Discussed follow up appointment with cardiology on 5/16.  Advised that [place] transportation team will contact them for details on arrangements.  Advised to take logs to appointment      Valente David, RN, MSN [place] Manager [phone]

## 2020-11-25 DIAGNOSIS — N2581 Secondary hyperparathyroidism of renal origin: Secondary | ICD-10-CM | POA: Diagnosis not present

## 2020-11-25 DIAGNOSIS — D689 Coagulation defect, unspecified: Secondary | ICD-10-CM | POA: Diagnosis not present

## 2020-11-25 DIAGNOSIS — N178 Other acute kidney failure: Secondary | ICD-10-CM | POA: Diagnosis not present

## 2020-11-25 DIAGNOSIS — D649 Anemia, unspecified: Secondary | ICD-10-CM | POA: Diagnosis not present

## 2020-11-25 DIAGNOSIS — Z992 Dependence on renal dialysis: Secondary | ICD-10-CM | POA: Diagnosis not present

## 2020-11-27 DIAGNOSIS — I509 Heart failure, unspecified: Secondary | ICD-10-CM | POA: Diagnosis not present

## 2020-11-27 DIAGNOSIS — N2581 Secondary hyperparathyroidism of renal origin: Secondary | ICD-10-CM | POA: Diagnosis not present

## 2020-11-27 DIAGNOSIS — N178 Other acute kidney failure: Secondary | ICD-10-CM | POA: Diagnosis not present

## 2020-11-27 DIAGNOSIS — D689 Coagulation defect, unspecified: Secondary | ICD-10-CM | POA: Diagnosis not present

## 2020-11-27 DIAGNOSIS — Z992 Dependence on renal dialysis: Secondary | ICD-10-CM | POA: Diagnosis not present

## 2020-11-27 DIAGNOSIS — D649 Anemia, unspecified: Secondary | ICD-10-CM | POA: Diagnosis not present

## 2020-11-27 DIAGNOSIS — N179 Acute kidney failure, unspecified: Secondary | ICD-10-CM | POA: Diagnosis not present

## 2020-11-29 DIAGNOSIS — N178 Other acute kidney failure: Secondary | ICD-10-CM | POA: Diagnosis not present

## 2020-11-29 DIAGNOSIS — D689 Coagulation defect, unspecified: Secondary | ICD-10-CM | POA: Diagnosis not present

## 2020-11-29 DIAGNOSIS — N2581 Secondary hyperparathyroidism of renal origin: Secondary | ICD-10-CM | POA: Diagnosis not present

## 2020-11-29 DIAGNOSIS — Z992 Dependence on renal dialysis: Secondary | ICD-10-CM | POA: Diagnosis not present

## 2020-11-29 DIAGNOSIS — D649 Anemia, unspecified: Secondary | ICD-10-CM | POA: Diagnosis not present

## 2020-12-02 DIAGNOSIS — D689 Coagulation defect, unspecified: Secondary | ICD-10-CM | POA: Diagnosis not present

## 2020-12-02 DIAGNOSIS — N2581 Secondary hyperparathyroidism of renal origin: Secondary | ICD-10-CM | POA: Diagnosis not present

## 2020-12-02 DIAGNOSIS — Z992 Dependence on renal dialysis: Secondary | ICD-10-CM | POA: Diagnosis not present

## 2020-12-02 DIAGNOSIS — N179 Acute kidney failure, unspecified: Secondary | ICD-10-CM | POA: Diagnosis not present

## 2020-12-02 DIAGNOSIS — D649 Anemia, unspecified: Secondary | ICD-10-CM | POA: Diagnosis not present

## 2020-12-02 DIAGNOSIS — N178 Other acute kidney failure: Secondary | ICD-10-CM | POA: Diagnosis not present

## 2020-12-03 DIAGNOSIS — I5082 Biventricular heart failure: Secondary | ICD-10-CM | POA: Diagnosis not present

## 2020-12-03 DIAGNOSIS — I272 Pulmonary hypertension, unspecified: Secondary | ICD-10-CM | POA: Diagnosis not present

## 2020-12-03 DIAGNOSIS — E1122 Type 2 diabetes mellitus with diabetic chronic kidney disease: Secondary | ICD-10-CM | POA: Diagnosis not present

## 2020-12-03 DIAGNOSIS — E876 Hypokalemia: Secondary | ICD-10-CM | POA: Diagnosis not present

## 2020-12-03 DIAGNOSIS — G9340 Encephalopathy, unspecified: Secondary | ICD-10-CM | POA: Diagnosis not present

## 2020-12-03 DIAGNOSIS — I110 Hypertensive heart disease with heart failure: Secondary | ICD-10-CM | POA: Diagnosis not present

## 2020-12-03 DIAGNOSIS — E782 Mixed hyperlipidemia: Secondary | ICD-10-CM | POA: Diagnosis not present

## 2020-12-03 DIAGNOSIS — E872 Acidosis: Secondary | ICD-10-CM | POA: Diagnosis not present

## 2020-12-03 DIAGNOSIS — I5023 Acute on chronic systolic (congestive) heart failure: Secondary | ICD-10-CM | POA: Diagnosis not present

## 2020-12-03 DIAGNOSIS — I4892 Unspecified atrial flutter: Secondary | ICD-10-CM | POA: Diagnosis not present

## 2020-12-03 DIAGNOSIS — K7460 Unspecified cirrhosis of liver: Secondary | ICD-10-CM | POA: Diagnosis not present

## 2020-12-03 DIAGNOSIS — N1831 Chronic kidney disease, stage 3a: Secondary | ICD-10-CM | POA: Diagnosis not present

## 2020-12-03 DIAGNOSIS — I5030 Unspecified diastolic (congestive) heart failure: Secondary | ICD-10-CM | POA: Diagnosis not present

## 2020-12-03 DIAGNOSIS — D631 Anemia in chronic kidney disease: Secondary | ICD-10-CM | POA: Diagnosis not present

## 2020-12-03 DIAGNOSIS — I4891 Unspecified atrial fibrillation: Secondary | ICD-10-CM | POA: Diagnosis not present

## 2020-12-03 DIAGNOSIS — K3189 Other diseases of stomach and duodenum: Secondary | ICD-10-CM | POA: Diagnosis not present

## 2020-12-03 DIAGNOSIS — N179 Acute kidney failure, unspecified: Secondary | ICD-10-CM | POA: Diagnosis not present

## 2020-12-04 DIAGNOSIS — Z992 Dependence on renal dialysis: Secondary | ICD-10-CM | POA: Diagnosis not present

## 2020-12-04 DIAGNOSIS — D689 Coagulation defect, unspecified: Secondary | ICD-10-CM | POA: Diagnosis not present

## 2020-12-04 DIAGNOSIS — D649 Anemia, unspecified: Secondary | ICD-10-CM | POA: Diagnosis not present

## 2020-12-04 DIAGNOSIS — N2581 Secondary hyperparathyroidism of renal origin: Secondary | ICD-10-CM | POA: Diagnosis not present

## 2020-12-04 DIAGNOSIS — N178 Other acute kidney failure: Secondary | ICD-10-CM | POA: Diagnosis not present

## 2020-12-05 DIAGNOSIS — E1151 Type 2 diabetes mellitus with diabetic peripheral angiopathy without gangrene: Secondary | ICD-10-CM | POA: Diagnosis not present

## 2020-12-05 DIAGNOSIS — B351 Tinea unguium: Secondary | ICD-10-CM | POA: Diagnosis not present

## 2020-12-05 DIAGNOSIS — I872 Venous insufficiency (chronic) (peripheral): Secondary | ICD-10-CM | POA: Diagnosis not present

## 2020-12-06 DIAGNOSIS — D689 Coagulation defect, unspecified: Secondary | ICD-10-CM | POA: Diagnosis not present

## 2020-12-06 DIAGNOSIS — Z992 Dependence on renal dialysis: Secondary | ICD-10-CM | POA: Diagnosis not present

## 2020-12-06 DIAGNOSIS — N178 Other acute kidney failure: Secondary | ICD-10-CM | POA: Diagnosis not present

## 2020-12-06 DIAGNOSIS — D649 Anemia, unspecified: Secondary | ICD-10-CM | POA: Diagnosis not present

## 2020-12-06 DIAGNOSIS — N2581 Secondary hyperparathyroidism of renal origin: Secondary | ICD-10-CM | POA: Diagnosis not present

## 2020-12-08 ENCOUNTER — Other Ambulatory Visit: Payer: Self-pay

## 2020-12-08 ENCOUNTER — Ambulatory Visit (HOSPITAL_COMMUNITY)
Admission: RE | Admit: 2020-12-08 | Discharge: 2020-12-08 | Disposition: A | Discharge status: Home / Self Care | Payer: Medicare HMO | Source: Ambulatory Visit | Attending: Adult Health | Admitting: Adult Health

## 2020-12-08 VITALS — BP 132/70 | HR 74 | Wt 193.4 lb

## 2020-12-08 DIAGNOSIS — K279 Peptic ulcer, site unspecified, unspecified as acute or chronic, without hemorrhage or perforation: Secondary | ICD-10-CM | POA: Insufficient documentation

## 2020-12-08 DIAGNOSIS — E1122 Type 2 diabetes mellitus with diabetic chronic kidney disease: Secondary | ICD-10-CM | POA: Diagnosis not present

## 2020-12-08 DIAGNOSIS — Z8249 Family history of ischemic heart disease and other diseases of the circulatory system: Secondary | ICD-10-CM | POA: Insufficient documentation

## 2020-12-08 DIAGNOSIS — I5042 Chronic combined systolic (congestive) and diastolic (congestive) heart failure: Secondary | ICD-10-CM

## 2020-12-08 DIAGNOSIS — I10 Essential (primary) hypertension: Secondary | ICD-10-CM

## 2020-12-08 DIAGNOSIS — Z87891 Personal history of nicotine dependence: Secondary | ICD-10-CM | POA: Diagnosis not present

## 2020-12-08 DIAGNOSIS — N186 End stage renal disease: Secondary | ICD-10-CM | POA: Diagnosis not present

## 2020-12-08 DIAGNOSIS — I132 Hypertensive heart and chronic kidney disease with heart failure and with stage 5 chronic kidney disease, or end stage renal disease: Secondary | ICD-10-CM | POA: Diagnosis not present

## 2020-12-08 DIAGNOSIS — Z992 Dependence on renal dialysis: Secondary | ICD-10-CM

## 2020-12-08 DIAGNOSIS — I5082 Biventricular heart failure: Secondary | ICD-10-CM | POA: Diagnosis not present

## 2020-12-08 DIAGNOSIS — Z7984 Long term (current) use of oral hypoglycemic drugs: Secondary | ICD-10-CM | POA: Insufficient documentation

## 2020-12-08 DIAGNOSIS — I2510 Atherosclerotic heart disease of native coronary artery without angina pectoris: Secondary | ICD-10-CM | POA: Insufficient documentation

## 2020-12-08 DIAGNOSIS — I480 Paroxysmal atrial fibrillation: Secondary | ICD-10-CM

## 2020-12-08 DIAGNOSIS — Z8711 Personal history of peptic ulcer disease: Secondary | ICD-10-CM | POA: Insufficient documentation

## 2020-12-08 DIAGNOSIS — Z7901 Long term (current) use of anticoagulants: Secondary | ICD-10-CM | POA: Insufficient documentation

## 2020-12-08 DIAGNOSIS — Z79899 Other long term (current) drug therapy: Secondary | ICD-10-CM | POA: Insufficient documentation

## 2020-12-08 MED ORDER — AMIODARONE HCL 100 MG PO TABS: 100.0000 mg | ORAL_TABLET | Freq: Every day | ORAL | 6 refills | Status: DC

## 2020-12-08 NOTE — Progress Notes (Signed)
PCP: Dr Hilma Favors Primary HF Cardiologist: Dr Haroldine Laws  Nephrology: Dr Marval Regal  HPI: [name] is a 80 year old with history of HTN, DMII, chronic biventricular heart failure, A flutter, AKI/ESRD, PUD, and GI bleed.   Had ECHO 2018 and EF was 55%.   Presented to [facility] 10/07/20 with weakness and diarrhea. On arrival EKG showed A flutter RVR. Hospital course complicated by AKI and Acute Biventricular Heart Failure. Echo showed LVEF 10-15% and severely reduced RV.  Attempted to diurese with IV lasix and milrinone but ultimately developed AKI. Placed on HD and was able to tolerate. 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  He failed cardioversion but chemically converted with amiodarone. Nephrology recommended continuing torsemide 100 mg at discharge.   Today he returns for post HF follow up with his wife. Overall feeling fine. Says he has more energy. Denies SOB/PND/Orthopnea. No chest pain. Able to perform all ADLs. Still with some lower extremity edema   Appetite ok. No fever or chills. Weight at home 184-188 pounds. Taking all medications.  Cardiac Testing  Echo 10/08/20 - EF 10-15% RV severely reduced. IVC dilated suggesting RA pressure 15. Echo 2018 EF 55%.    TEE 10/10/20  w/ severe Biv dysfunction, Mod-severe MR, severe TR. No LAA thrombus. DCCV unsuccessful   RHC 10/24/20 Ao = 89/52 (68) RA = 10 RV = 45/12 PA = 47/17 (28) PCW = 9 Fick cardiac output/index = 6.6/3.1 PVR =2.9 FA sat = 98% PA sat = 65%, 67% Assessment: 1. 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 RCA not injected 2. Mild PAH 3. 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   ROS: All systems negative except as listed in HPI, PMH and Problem List.  SH:  Social History   Socioeconomic History  . Marital status: Married    Spouse name: Not on file  . Number of children: Not on file  . Years of education: Not on file  .  Highest education level: Not on file  Occupational History  . Not on file  Tobacco Use  . Smoking status: Former Smoker    Packs/day: 0.50    Types: Cigarettes    Quit date: 07/26/1987    Years since quitting: 33.3  . Smokeless tobacco: Never Used  Vaping Use  . Vaping Use: Never used  Substance and Sexual Activity  . Alcohol use: No    Alcohol/week: 0.0 standard drinks    Comment: Previously drank socially, last drank 25-30 years ago  . Drug use: No  . Sexual activity: Not on file  Other Topics Concern  . Not on file  Social History Narrative  . Not on file   Social Determinants of Health   Financial Resource Strain: Not on file  Food Insecurity: No Food Insecurity  . Worried About Charity fundraiser in the Last Year: Never true  . Ran Out of Food in the Last Year: Never true  Transportation Needs: No Transportation Needs  . Lack of Transportation (Medical): No  . Lack of Transportation (Non-Medical): No  Physical Activity: Not on file  Stress: Not on file  Social Connections: Not on file  Intimate Partner Violence: Not on file    FH:  Family History  Problem Relation Age of Onset  . Hypertension Mother   . Hyperlipidemia Mother   . Hypertension Father   . Hyperlipidemia Father   . Colon cancer Neg Hx     Past Medical  History:  Diagnosis Date  . Diabetes mellitus without complication ([place])   . Diastolic CHF, acute ([place]) 10/08/2020  . HTN (hypertension)     Current Outpatient Medications  Medication Sig Dispense Refill  . amiodarone (PACERONE) 200 MG tablet Take 1 tablet (200 mg total) by mouth daily. 30 tablet 3  . apixaban (ELIQUIS) 5 MG TABS tablet Take 1 tablet (5 mg total) by mouth 2 (two) times daily. 60 tablet 3  . Artificial Tear Ointment (DRY EYES OP) Apply 1 drop to eye daily as needed (for dry eyes).    Marland Kitchen atorvastatin (LIPITOR) 20 MG tablet Take 1 tablet (20 mg total) by mouth daily. 30 tablet 3  . blood glucose meter kit and supplies KIT  Dispense based on patient and insurance preference. Use up to 2 times daily as directed. (FOR ICD-10 E11.65) 1 each 5  . glipiZIDE (GLUCOTROL) 5 MG tablet Take 1 tablet by mouth once daily with breakfast (Patient taking differently: Take 5 mg by mouth daily before breakfast.) 90 tablet 0  . glucose blood (ACCU-CHEK GUIDE) test strip Use as instructed bid E11.65 100 each 3  . Homeopathic Products (AVENOC) OINT Apply 1 application topically daily as needed (foot care).    . hydrocortisone cream 1 % Apply 1 application topically 3 (three) times daily as needed for itching (minor skin irritation). 30 g 0  . Lancets (ACCU-CHEK MULTICLIX) lancets Use as instructed bid. E11.65 100 each 3  . pantoprazole (PROTONIX) 40 MG tablet Take 1 tablet (40 mg total) by mouth daily. 30 tablet 2  . torsemide (DEMADEX) 100 MG tablet Take 1 tablet (100 mg total) by mouth daily. 30 tablet 3   No current facility-administered medications for this encounter.    Vitals:   12/08/20 1408  BP: 132/70  Pulse: 74  SpO2: 100%  Weight: 87.7 kg (193 lb 6.4 oz)   Wt Readings from Last 3 Encounters:  12/08/20 87.7 kg  10/27/20 104.8 kg  04/14/20 120 kg    PHYSICAL EXAM: General:  No resp difficulty HEENT: normal Neck: supple. JVP 7-8 . Carotids 2+ bilaterally; no bruits. No lymphadenopathy or thryomegaly appreciated. Cor: PMI normal. Regular rate & rhythm. No rubs, gallops or murmurs.  R upper chest HD catheter.  Lungs: clear Abdomen: soft, nontender, nondistended. No hepatosplenomegaly. No bruits or masses. Good bowel sounds. Extremities: no cyanosis, clubbing, rash, R and LLE 1+ edema Neuro: alert & orientedx3, cranial nerves grossly intact. Moves all 4 extremities w/o difficulty. Affect pleasant.   ECG: SR 1st degree AV block. PR 238 sm    ASSESSMENT & PLAN:  1. PAF (A Flutter)  RVR - EKG-  NSR - Cut back amio to 100 mg daily.  - Continue eliquis 5 mg twice a day.  - Next visit check TSH/LFTs.   2.  Chronic Biventricular Heart Failure - Had ECHO 2018 and EF 55%. Echo 09/2020 EF <15%. Suspect tachy-mediated. No cath with creatinine > 6 - Had RHC 3/24 with elevated filling pressures R>L and preserved cardiac output. 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 RCA not injected.  - No bb w/ low output  - No arb/dig/spiro with ESRD  - Volume per HD. Diuretics per renal -- Hopefully EF improves with maintenance of NSR.  - Check ECHO in 3 months  3. ESRD -Started HD in April and has been tolerating without difficulty. -On torsemide per nephrology.   4. DMII - Hgb A1C 5.9  - On lantus/SSI  5.  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  -Continue PPI  6. HTN  Stable.   Follow up in 6 weeks with APP. ? Consider Hydralazine/Imdur.  Follow up in 3 months with Dr Haroldine Laws and an ECHO.

## 2020-12-08 NOTE — Patient Instructions (Signed)
DECREASE Amiodarone to 100 mg, one tab daily  Your physician recommends that you schedule a follow-up appointment in: 6 weeks  in the Advanced Practitioners (PA/NP) Clinic   Your physician recommends that you schedule a follow-up appointment in: 3-4 months with Dr Haroldine Laws and echo  Your physician has requested that you have an echocardiogram. Echocardiography is a painless test that uses sound waves to create images of your heart. It provides your doctor with information about the size and shape of your heart and how well your heart's chambers and valves are working. This procedure takes approximately one hour. There are no restrictions for this procedure.   Do the following things EVERYDAY: 1) Weigh yourself in the morning before breakfast. Write it down and keep it in a log. 2) Take your medicines as prescribed 3) Eat low salt foods--Limit salt (sodium) to 2000 mg per day.  4) Stay as active as you can everyday 5) Limit all fluids for the day to less than 2 liters  At the [facility], you and your health needs are our priority. As part of our continuing mission to provide you with exceptional heart care, we have created designated Provider Care Teams. These Care Teams include your primary Cardiologist (physician) and Advanced Practice Providers (APPs- Physician Assistants and Nurse Practitioners) who all work together to provide you with the care you need, when you need it.   You may see any of the following providers on your designated Care Team at your next follow up: Marland Kitchen Dr Glori Bickers . Dr Loralie Champagne . Dr Vickki Muff . Darrick Grinder, NP . Lyda Jester, [place] . Audry Riles, PharmD   Please be sure to bring in all your medications bottles to every appointment.

## 2020-12-09 DIAGNOSIS — Z992 Dependence on renal dialysis: Secondary | ICD-10-CM | POA: Diagnosis not present

## 2020-12-09 DIAGNOSIS — D649 Anemia, unspecified: Secondary | ICD-10-CM | POA: Diagnosis not present

## 2020-12-09 DIAGNOSIS — N178 Other acute kidney failure: Secondary | ICD-10-CM | POA: Diagnosis not present

## 2020-12-09 DIAGNOSIS — Z23 Encounter for immunization: Secondary | ICD-10-CM | POA: Diagnosis not present

## 2020-12-09 DIAGNOSIS — D689 Coagulation defect, unspecified: Secondary | ICD-10-CM | POA: Diagnosis not present

## 2020-12-09 DIAGNOSIS — N2581 Secondary hyperparathyroidism of renal origin: Secondary | ICD-10-CM | POA: Diagnosis not present

## 2020-12-10 DIAGNOSIS — K3189 Other diseases of stomach and duodenum: Secondary | ICD-10-CM | POA: Diagnosis not present

## 2020-12-10 DIAGNOSIS — I5082 Biventricular heart failure: Secondary | ICD-10-CM | POA: Diagnosis not present

## 2020-12-10 DIAGNOSIS — E1122 Type 2 diabetes mellitus with diabetic chronic kidney disease: Secondary | ICD-10-CM | POA: Diagnosis not present

## 2020-12-10 DIAGNOSIS — I4892 Unspecified atrial flutter: Secondary | ICD-10-CM | POA: Diagnosis not present

## 2020-12-10 DIAGNOSIS — I272 Pulmonary hypertension, unspecified: Secondary | ICD-10-CM | POA: Diagnosis not present

## 2020-12-10 DIAGNOSIS — E872 Acidosis: Secondary | ICD-10-CM | POA: Diagnosis not present

## 2020-12-10 DIAGNOSIS — I5023 Acute on chronic systolic (congestive) heart failure: Secondary | ICD-10-CM | POA: Diagnosis not present

## 2020-12-10 DIAGNOSIS — K7460 Unspecified cirrhosis of liver: Secondary | ICD-10-CM | POA: Diagnosis not present

## 2020-12-10 DIAGNOSIS — D631 Anemia in chronic kidney disease: Secondary | ICD-10-CM | POA: Diagnosis not present

## 2020-12-10 DIAGNOSIS — E876 Hypokalemia: Secondary | ICD-10-CM | POA: Diagnosis not present

## 2020-12-10 DIAGNOSIS — N179 Acute kidney failure, unspecified: Secondary | ICD-10-CM | POA: Diagnosis not present

## 2020-12-10 DIAGNOSIS — E782 Mixed hyperlipidemia: Secondary | ICD-10-CM | POA: Diagnosis not present

## 2020-12-10 DIAGNOSIS — N1831 Chronic kidney disease, stage 3a: Secondary | ICD-10-CM | POA: Diagnosis not present

## 2020-12-10 DIAGNOSIS — I110 Hypertensive heart disease with heart failure: Secondary | ICD-10-CM | POA: Diagnosis not present

## 2020-12-10 DIAGNOSIS — I5030 Unspecified diastolic (congestive) heart failure: Secondary | ICD-10-CM | POA: Diagnosis not present

## 2020-12-10 DIAGNOSIS — I4891 Unspecified atrial fibrillation: Secondary | ICD-10-CM | POA: Diagnosis not present

## 2020-12-10 DIAGNOSIS — G9340 Encephalopathy, unspecified: Secondary | ICD-10-CM | POA: Diagnosis not present

## 2020-12-11 DIAGNOSIS — Z992 Dependence on renal dialysis: Secondary | ICD-10-CM | POA: Diagnosis not present

## 2020-12-11 DIAGNOSIS — N2581 Secondary hyperparathyroidism of renal origin: Secondary | ICD-10-CM | POA: Diagnosis not present

## 2020-12-11 DIAGNOSIS — D689 Coagulation defect, unspecified: Secondary | ICD-10-CM | POA: Diagnosis not present

## 2020-12-11 DIAGNOSIS — N179 Acute kidney failure, unspecified: Secondary | ICD-10-CM | POA: Diagnosis not present

## 2020-12-11 DIAGNOSIS — N178 Other acute kidney failure: Secondary | ICD-10-CM | POA: Diagnosis not present

## 2020-12-11 DIAGNOSIS — D649 Anemia, unspecified: Secondary | ICD-10-CM | POA: Diagnosis not present

## 2020-12-11 DIAGNOSIS — Z23 Encounter for immunization: Secondary | ICD-10-CM | POA: Diagnosis not present

## 2020-12-13 DIAGNOSIS — D649 Anemia, unspecified: Secondary | ICD-10-CM | POA: Diagnosis not present

## 2020-12-13 DIAGNOSIS — N178 Other acute kidney failure: Secondary | ICD-10-CM | POA: Diagnosis not present

## 2020-12-13 DIAGNOSIS — Z992 Dependence on renal dialysis: Secondary | ICD-10-CM | POA: Diagnosis not present

## 2020-12-13 DIAGNOSIS — N2581 Secondary hyperparathyroidism of renal origin: Secondary | ICD-10-CM | POA: Diagnosis not present

## 2020-12-13 DIAGNOSIS — Z23 Encounter for immunization: Secondary | ICD-10-CM | POA: Diagnosis not present

## 2020-12-13 DIAGNOSIS — D689 Coagulation defect, unspecified: Secondary | ICD-10-CM | POA: Diagnosis not present

## 2020-12-15 DIAGNOSIS — N179 Acute kidney failure, unspecified: Secondary | ICD-10-CM | POA: Diagnosis not present

## 2020-12-15 DIAGNOSIS — G9340 Encephalopathy, unspecified: Secondary | ICD-10-CM | POA: Diagnosis not present

## 2020-12-15 DIAGNOSIS — I5030 Unspecified diastolic (congestive) heart failure: Secondary | ICD-10-CM | POA: Diagnosis not present

## 2020-12-15 DIAGNOSIS — D631 Anemia in chronic kidney disease: Secondary | ICD-10-CM | POA: Diagnosis not present

## 2020-12-15 DIAGNOSIS — N1831 Chronic kidney disease, stage 3a: Secondary | ICD-10-CM | POA: Diagnosis not present

## 2020-12-15 DIAGNOSIS — K3189 Other diseases of stomach and duodenum: Secondary | ICD-10-CM | POA: Diagnosis not present

## 2020-12-15 DIAGNOSIS — I5023 Acute on chronic systolic (congestive) heart failure: Secondary | ICD-10-CM | POA: Diagnosis not present

## 2020-12-15 DIAGNOSIS — I5082 Biventricular heart failure: Secondary | ICD-10-CM | POA: Diagnosis not present

## 2020-12-15 DIAGNOSIS — E1122 Type 2 diabetes mellitus with diabetic chronic kidney disease: Secondary | ICD-10-CM | POA: Diagnosis not present

## 2020-12-15 DIAGNOSIS — I110 Hypertensive heart disease with heart failure: Secondary | ICD-10-CM | POA: Diagnosis not present

## 2020-12-15 DIAGNOSIS — I272 Pulmonary hypertension, unspecified: Secondary | ICD-10-CM | POA: Diagnosis not present

## 2020-12-15 DIAGNOSIS — E876 Hypokalemia: Secondary | ICD-10-CM | POA: Diagnosis not present

## 2020-12-15 DIAGNOSIS — E782 Mixed hyperlipidemia: Secondary | ICD-10-CM | POA: Diagnosis not present

## 2020-12-15 DIAGNOSIS — I4891 Unspecified atrial fibrillation: Secondary | ICD-10-CM | POA: Diagnosis not present

## 2020-12-15 DIAGNOSIS — E872 Acidosis: Secondary | ICD-10-CM | POA: Diagnosis not present

## 2020-12-15 DIAGNOSIS — K7460 Unspecified cirrhosis of liver: Secondary | ICD-10-CM | POA: Diagnosis not present

## 2020-12-15 DIAGNOSIS — I4892 Unspecified atrial flutter: Secondary | ICD-10-CM | POA: Diagnosis not present

## 2020-12-16 DIAGNOSIS — Z992 Dependence on renal dialysis: Secondary | ICD-10-CM | POA: Diagnosis not present

## 2020-12-16 DIAGNOSIS — D689 Coagulation defect, unspecified: Secondary | ICD-10-CM | POA: Diagnosis not present

## 2020-12-16 DIAGNOSIS — D649 Anemia, unspecified: Secondary | ICD-10-CM | POA: Diagnosis not present

## 2020-12-16 DIAGNOSIS — N178 Other acute kidney failure: Secondary | ICD-10-CM | POA: Diagnosis not present

## 2020-12-16 DIAGNOSIS — N2581 Secondary hyperparathyroidism of renal origin: Secondary | ICD-10-CM | POA: Diagnosis not present

## 2020-12-18 DIAGNOSIS — N179 Acute kidney failure, unspecified: Secondary | ICD-10-CM | POA: Diagnosis not present

## 2020-12-18 DIAGNOSIS — N178 Other acute kidney failure: Secondary | ICD-10-CM | POA: Diagnosis not present

## 2020-12-18 DIAGNOSIS — D649 Anemia, unspecified: Secondary | ICD-10-CM | POA: Diagnosis not present

## 2020-12-18 DIAGNOSIS — Z992 Dependence on renal dialysis: Secondary | ICD-10-CM | POA: Diagnosis not present

## 2020-12-18 DIAGNOSIS — N2581 Secondary hyperparathyroidism of renal origin: Secondary | ICD-10-CM | POA: Diagnosis not present

## 2020-12-18 DIAGNOSIS — D689 Coagulation defect, unspecified: Secondary | ICD-10-CM | POA: Diagnosis not present

## 2020-12-20 DIAGNOSIS — N178 Other acute kidney failure: Secondary | ICD-10-CM | POA: Diagnosis not present

## 2020-12-20 DIAGNOSIS — N2581 Secondary hyperparathyroidism of renal origin: Secondary | ICD-10-CM | POA: Diagnosis not present

## 2020-12-20 DIAGNOSIS — D649 Anemia, unspecified: Secondary | ICD-10-CM | POA: Diagnosis not present

## 2020-12-20 DIAGNOSIS — Z992 Dependence on renal dialysis: Secondary | ICD-10-CM | POA: Diagnosis not present

## 2020-12-20 DIAGNOSIS — D689 Coagulation defect, unspecified: Secondary | ICD-10-CM | POA: Diagnosis not present

## 2020-12-22 DIAGNOSIS — E669 Obesity, unspecified: Secondary | ICD-10-CM | POA: Diagnosis not present

## 2020-12-22 DIAGNOSIS — E261 Secondary hyperaldosteronism: Secondary | ICD-10-CM | POA: Diagnosis not present

## 2020-12-22 DIAGNOSIS — Z992 Dependence on renal dialysis: Secondary | ICD-10-CM | POA: Diagnosis not present

## 2020-12-22 DIAGNOSIS — D6869 Other thrombophilia: Secondary | ICD-10-CM | POA: Diagnosis not present

## 2020-12-22 DIAGNOSIS — I4891 Unspecified atrial fibrillation: Secondary | ICD-10-CM | POA: Diagnosis not present

## 2020-12-22 DIAGNOSIS — E1122 Type 2 diabetes mellitus with diabetic chronic kidney disease: Secondary | ICD-10-CM | POA: Diagnosis not present

## 2020-12-22 DIAGNOSIS — E785 Hyperlipidemia, unspecified: Secondary | ICD-10-CM | POA: Diagnosis not present

## 2020-12-22 DIAGNOSIS — I132 Hypertensive heart and chronic kidney disease with heart failure and with stage 5 chronic kidney disease, or end stage renal disease: Secondary | ICD-10-CM | POA: Diagnosis not present

## 2020-12-22 DIAGNOSIS — N186 End stage renal disease: Secondary | ICD-10-CM | POA: Diagnosis not present

## 2020-12-22 DIAGNOSIS — I509 Heart failure, unspecified: Secondary | ICD-10-CM | POA: Diagnosis not present

## 2020-12-23 DIAGNOSIS — D689 Coagulation defect, unspecified: Secondary | ICD-10-CM | POA: Diagnosis not present

## 2020-12-23 DIAGNOSIS — N178 Other acute kidney failure: Secondary | ICD-10-CM | POA: Diagnosis not present

## 2020-12-23 DIAGNOSIS — N2581 Secondary hyperparathyroidism of renal origin: Secondary | ICD-10-CM | POA: Diagnosis not present

## 2020-12-23 DIAGNOSIS — Z992 Dependence on renal dialysis: Secondary | ICD-10-CM | POA: Diagnosis not present

## 2020-12-23 DIAGNOSIS — D649 Anemia, unspecified: Secondary | ICD-10-CM | POA: Diagnosis not present

## 2020-12-24 ENCOUNTER — Ambulatory Visit: Payer: Self-pay | Admitting: *Deleted

## 2020-12-24 ENCOUNTER — Other Ambulatory Visit: Payer: Self-pay | Admitting: *Deleted

## 2020-12-24 NOTE — Patient Outreach (Signed)
[facility]) Care Management  12/24/2020  Kyle Hicks [birth year] [id number]   Outgoing call placed to member/wife, no answer, HIPAA compliant voice message left.  Will follow up within the next 3-4 business days.  Valente David, [place], MSN [place] [phone]

## 2020-12-25 ENCOUNTER — Other Ambulatory Visit: Payer: Self-pay | Admitting: *Deleted

## 2020-12-25 DIAGNOSIS — N178 Other acute kidney failure: Secondary | ICD-10-CM | POA: Diagnosis not present

## 2020-12-25 DIAGNOSIS — Z992 Dependence on renal dialysis: Secondary | ICD-10-CM | POA: Diagnosis not present

## 2020-12-25 DIAGNOSIS — N2581 Secondary hyperparathyroidism of renal origin: Secondary | ICD-10-CM | POA: Diagnosis not present

## 2020-12-25 DIAGNOSIS — D689 Coagulation defect, unspecified: Secondary | ICD-10-CM | POA: Diagnosis not present

## 2020-12-25 NOTE — Patient Outreach (Signed)
[facility]) Care Management  12/25/2020  JOMARI RUDIS [birth year] [id number]   Incoming call received from [facility] wife, state member is doing "great", gaining strength and doing more on his own.  Continues with dialysis without issues, will have catheter removed and access placed in arm next month.  Denies any urgent concerns, encouraged to contact this care manager with questions.  Agrees to follow up within the next month.   Goals Addressed            This Visit's Progress   . [facility]N - Track and Manage Fluids and Swelling-Heart Failure   On track    Timeframe:  Short-Term Goal Priority:  High Start Date:       4/13                      Expected End Date: 5/13                      Barriers: Knowledge    - keep legs up while sitting - track weight in diary - weigh myself daily    Why is this important?    It is important to check your weight daily and watch how much salt and liquids you have.   It will help you to manage your heart failure.    Notes:   5/2 - Weight today was 187.2, yesterday was 187.4 pounds.  Educated on when to contact provider  6/2 - Per wife, weight has remained down, no gain, no shortness of breath or swelling.    Marland Kitchen THN - Track and Manage Symptoms-Heart Failure   On track    Timeframe:  Long-Range Goal Priority:  High Start Date:         4/13                    Expected End Date:    6/13                    Barriers: Knowledge   - begin a heart failure diary - develop a rescue plan - know when to call the doctor    Why is this important?    You will be able to handle your symptoms better if you keep track of them.   Making some simple changes to your lifestyle will help.   Eating healthy is one thing you can do to take good care of yourself.    Notes:   5/2 - Discussed follow up appointment with cardiology on 5/16.  Advised that [place] transportation team will contact them for details on arrangements.  Advised  to take logs to appointment  6/2 - Was seen by cardiology last week, wife report appointment went well, remains in NSR, denies chest pain or discomfort       Valente David, RN, MSN [place] [phone]

## 2020-12-27 DIAGNOSIS — Z992 Dependence on renal dialysis: Secondary | ICD-10-CM | POA: Diagnosis not present

## 2020-12-27 DIAGNOSIS — N178 Other acute kidney failure: Secondary | ICD-10-CM | POA: Diagnosis not present

## 2020-12-27 DIAGNOSIS — D689 Coagulation defect, unspecified: Secondary | ICD-10-CM | POA: Diagnosis not present

## 2020-12-27 DIAGNOSIS — N2581 Secondary hyperparathyroidism of renal origin: Secondary | ICD-10-CM | POA: Diagnosis not present

## 2020-12-28 DIAGNOSIS — I5023 Acute on chronic systolic (congestive) heart failure: Secondary | ICD-10-CM | POA: Diagnosis not present

## 2020-12-28 DIAGNOSIS — I5030 Unspecified diastolic (congestive) heart failure: Secondary | ICD-10-CM | POA: Diagnosis not present

## 2020-12-28 DIAGNOSIS — I4892 Unspecified atrial flutter: Secondary | ICD-10-CM | POA: Diagnosis not present

## 2020-12-28 DIAGNOSIS — I5082 Biventricular heart failure: Secondary | ICD-10-CM | POA: Diagnosis not present

## 2020-12-28 DIAGNOSIS — K3189 Other diseases of stomach and duodenum: Secondary | ICD-10-CM | POA: Diagnosis not present

## 2020-12-28 DIAGNOSIS — E1122 Type 2 diabetes mellitus with diabetic chronic kidney disease: Secondary | ICD-10-CM | POA: Diagnosis not present

## 2020-12-28 DIAGNOSIS — N179 Acute kidney failure, unspecified: Secondary | ICD-10-CM | POA: Diagnosis not present

## 2020-12-28 DIAGNOSIS — K7460 Unspecified cirrhosis of liver: Secondary | ICD-10-CM | POA: Diagnosis not present

## 2020-12-28 DIAGNOSIS — N1831 Chronic kidney disease, stage 3a: Secondary | ICD-10-CM | POA: Diagnosis not present

## 2020-12-28 DIAGNOSIS — I4891 Unspecified atrial fibrillation: Secondary | ICD-10-CM | POA: Diagnosis not present

## 2020-12-28 DIAGNOSIS — E876 Hypokalemia: Secondary | ICD-10-CM | POA: Diagnosis not present

## 2020-12-28 DIAGNOSIS — E872 Acidosis: Secondary | ICD-10-CM | POA: Diagnosis not present

## 2020-12-28 DIAGNOSIS — I110 Hypertensive heart disease with heart failure: Secondary | ICD-10-CM | POA: Diagnosis not present

## 2020-12-28 DIAGNOSIS — I272 Pulmonary hypertension, unspecified: Secondary | ICD-10-CM | POA: Diagnosis not present

## 2020-12-28 DIAGNOSIS — E782 Mixed hyperlipidemia: Secondary | ICD-10-CM | POA: Diagnosis not present

## 2020-12-28 DIAGNOSIS — G9340 Encephalopathy, unspecified: Secondary | ICD-10-CM | POA: Diagnosis not present

## 2020-12-28 DIAGNOSIS — D631 Anemia in chronic kidney disease: Secondary | ICD-10-CM | POA: Diagnosis not present

## 2020-12-30 ENCOUNTER — Ambulatory Visit: Payer: Self-pay | Admitting: *Deleted

## 2020-12-30 DIAGNOSIS — D689 Coagulation defect, unspecified: Secondary | ICD-10-CM | POA: Diagnosis not present

## 2020-12-30 DIAGNOSIS — D649 Anemia, unspecified: Secondary | ICD-10-CM | POA: Diagnosis not present

## 2020-12-30 DIAGNOSIS — N178 Other acute kidney failure: Secondary | ICD-10-CM | POA: Diagnosis not present

## 2020-12-30 DIAGNOSIS — Z992 Dependence on renal dialysis: Secondary | ICD-10-CM | POA: Diagnosis not present

## 2020-12-30 DIAGNOSIS — N2581 Secondary hyperparathyroidism of renal origin: Secondary | ICD-10-CM | POA: Diagnosis not present

## 2021-01-01 DIAGNOSIS — D689 Coagulation defect, unspecified: Secondary | ICD-10-CM | POA: Diagnosis not present

## 2021-01-01 DIAGNOSIS — N2581 Secondary hyperparathyroidism of renal origin: Secondary | ICD-10-CM | POA: Diagnosis not present

## 2021-01-01 DIAGNOSIS — Z992 Dependence on renal dialysis: Secondary | ICD-10-CM | POA: Diagnosis not present

## 2021-01-01 DIAGNOSIS — N178 Other acute kidney failure: Secondary | ICD-10-CM | POA: Diagnosis not present

## 2021-01-01 DIAGNOSIS — D649 Anemia, unspecified: Secondary | ICD-10-CM | POA: Diagnosis not present

## 2021-01-02 ENCOUNTER — Other Ambulatory Visit: Payer: Self-pay | Admitting: *Deleted

## 2021-01-02 DIAGNOSIS — E872 Acidosis: Secondary | ICD-10-CM | POA: Diagnosis not present

## 2021-01-02 DIAGNOSIS — N186 End stage renal disease: Secondary | ICD-10-CM

## 2021-01-02 DIAGNOSIS — E876 Hypokalemia: Secondary | ICD-10-CM | POA: Diagnosis not present

## 2021-01-02 DIAGNOSIS — E1122 Type 2 diabetes mellitus with diabetic chronic kidney disease: Secondary | ICD-10-CM | POA: Diagnosis not present

## 2021-01-02 DIAGNOSIS — I272 Pulmonary hypertension, unspecified: Secondary | ICD-10-CM | POA: Diagnosis not present

## 2021-01-02 DIAGNOSIS — I110 Hypertensive heart disease with heart failure: Secondary | ICD-10-CM | POA: Diagnosis not present

## 2021-01-02 DIAGNOSIS — I5023 Acute on chronic systolic (congestive) heart failure: Secondary | ICD-10-CM | POA: Diagnosis not present

## 2021-01-02 DIAGNOSIS — I5030 Unspecified diastolic (congestive) heart failure: Secondary | ICD-10-CM | POA: Diagnosis not present

## 2021-01-02 DIAGNOSIS — Z7901 Long term (current) use of anticoagulants: Secondary | ICD-10-CM | POA: Diagnosis not present

## 2021-01-02 DIAGNOSIS — I4891 Unspecified atrial fibrillation: Secondary | ICD-10-CM | POA: Diagnosis not present

## 2021-01-02 DIAGNOSIS — K7460 Unspecified cirrhosis of liver: Secondary | ICD-10-CM | POA: Diagnosis not present

## 2021-01-02 DIAGNOSIS — Z7984 Long term (current) use of oral hypoglycemic drugs: Secondary | ICD-10-CM | POA: Diagnosis not present

## 2021-01-02 DIAGNOSIS — E782 Mixed hyperlipidemia: Secondary | ICD-10-CM | POA: Diagnosis not present

## 2021-01-02 DIAGNOSIS — G9340 Encephalopathy, unspecified: Secondary | ICD-10-CM | POA: Diagnosis not present

## 2021-01-02 DIAGNOSIS — K3189 Other diseases of stomach and duodenum: Secondary | ICD-10-CM | POA: Diagnosis not present

## 2021-01-02 DIAGNOSIS — Z992 Dependence on renal dialysis: Secondary | ICD-10-CM | POA: Diagnosis not present

## 2021-01-02 DIAGNOSIS — I4892 Unspecified atrial flutter: Secondary | ICD-10-CM | POA: Diagnosis not present

## 2021-01-02 DIAGNOSIS — D631 Anemia in chronic kidney disease: Secondary | ICD-10-CM | POA: Diagnosis not present

## 2021-01-02 DIAGNOSIS — I5082 Biventricular heart failure: Secondary | ICD-10-CM | POA: Diagnosis not present

## 2021-01-02 DIAGNOSIS — N1831 Chronic kidney disease, stage 3a: Secondary | ICD-10-CM | POA: Diagnosis not present

## 2021-01-02 DIAGNOSIS — Z6830 Body mass index (BMI) 30.0-30.9, adult: Secondary | ICD-10-CM | POA: Diagnosis not present

## 2021-01-02 DIAGNOSIS — E871 Hypo-osmolality and hyponatremia: Secondary | ICD-10-CM | POA: Diagnosis not present

## 2021-01-03 DIAGNOSIS — D649 Anemia, unspecified: Secondary | ICD-10-CM | POA: Diagnosis not present

## 2021-01-03 DIAGNOSIS — N178 Other acute kidney failure: Secondary | ICD-10-CM | POA: Diagnosis not present

## 2021-01-03 DIAGNOSIS — N2581 Secondary hyperparathyroidism of renal origin: Secondary | ICD-10-CM | POA: Diagnosis not present

## 2021-01-03 DIAGNOSIS — D689 Coagulation defect, unspecified: Secondary | ICD-10-CM | POA: Diagnosis not present

## 2021-01-03 DIAGNOSIS — Z992 Dependence on renal dialysis: Secondary | ICD-10-CM | POA: Diagnosis not present

## 2021-01-06 DIAGNOSIS — N178 Other acute kidney failure: Secondary | ICD-10-CM | POA: Diagnosis not present

## 2021-01-06 DIAGNOSIS — E119 Type 2 diabetes mellitus without complications: Secondary | ICD-10-CM | POA: Diagnosis not present

## 2021-01-06 DIAGNOSIS — N2581 Secondary hyperparathyroidism of renal origin: Secondary | ICD-10-CM | POA: Diagnosis not present

## 2021-01-06 DIAGNOSIS — D649 Anemia, unspecified: Secondary | ICD-10-CM | POA: Diagnosis not present

## 2021-01-06 DIAGNOSIS — Z23 Encounter for immunization: Secondary | ICD-10-CM | POA: Diagnosis not present

## 2021-01-06 DIAGNOSIS — D689 Coagulation defect, unspecified: Secondary | ICD-10-CM | POA: Diagnosis not present

## 2021-01-06 DIAGNOSIS — Z992 Dependence on renal dialysis: Secondary | ICD-10-CM | POA: Diagnosis not present

## 2021-01-07 DIAGNOSIS — Z7984 Long term (current) use of oral hypoglycemic drugs: Secondary | ICD-10-CM | POA: Diagnosis not present

## 2021-01-07 DIAGNOSIS — Z7901 Long term (current) use of anticoagulants: Secondary | ICD-10-CM | POA: Diagnosis not present

## 2021-01-07 DIAGNOSIS — I5082 Biventricular heart failure: Secondary | ICD-10-CM | POA: Diagnosis not present

## 2021-01-07 DIAGNOSIS — E872 Acidosis: Secondary | ICD-10-CM | POA: Diagnosis not present

## 2021-01-07 DIAGNOSIS — Z6830 Body mass index (BMI) 30.0-30.9, adult: Secondary | ICD-10-CM | POA: Diagnosis not present

## 2021-01-07 DIAGNOSIS — I5023 Acute on chronic systolic (congestive) heart failure: Secondary | ICD-10-CM | POA: Diagnosis not present

## 2021-01-07 DIAGNOSIS — I110 Hypertensive heart disease with heart failure: Secondary | ICD-10-CM | POA: Diagnosis not present

## 2021-01-07 DIAGNOSIS — I5030 Unspecified diastolic (congestive) heart failure: Secondary | ICD-10-CM | POA: Diagnosis not present

## 2021-01-07 DIAGNOSIS — K3189 Other diseases of stomach and duodenum: Secondary | ICD-10-CM | POA: Diagnosis not present

## 2021-01-07 DIAGNOSIS — N1831 Chronic kidney disease, stage 3a: Secondary | ICD-10-CM | POA: Diagnosis not present

## 2021-01-07 DIAGNOSIS — E1122 Type 2 diabetes mellitus with diabetic chronic kidney disease: Secondary | ICD-10-CM | POA: Diagnosis not present

## 2021-01-07 DIAGNOSIS — I4892 Unspecified atrial flutter: Secondary | ICD-10-CM | POA: Diagnosis not present

## 2021-01-07 DIAGNOSIS — E876 Hypokalemia: Secondary | ICD-10-CM | POA: Diagnosis not present

## 2021-01-07 DIAGNOSIS — I272 Pulmonary hypertension, unspecified: Secondary | ICD-10-CM | POA: Diagnosis not present

## 2021-01-07 DIAGNOSIS — K7460 Unspecified cirrhosis of liver: Secondary | ICD-10-CM | POA: Diagnosis not present

## 2021-01-07 DIAGNOSIS — D631 Anemia in chronic kidney disease: Secondary | ICD-10-CM | POA: Diagnosis not present

## 2021-01-07 DIAGNOSIS — E782 Mixed hyperlipidemia: Secondary | ICD-10-CM | POA: Diagnosis not present

## 2021-01-07 DIAGNOSIS — I4891 Unspecified atrial fibrillation: Secondary | ICD-10-CM | POA: Diagnosis not present

## 2021-01-07 DIAGNOSIS — E871 Hypo-osmolality and hyponatremia: Secondary | ICD-10-CM | POA: Diagnosis not present

## 2021-01-07 DIAGNOSIS — Z992 Dependence on renal dialysis: Secondary | ICD-10-CM | POA: Diagnosis not present

## 2021-01-07 DIAGNOSIS — G9340 Encephalopathy, unspecified: Secondary | ICD-10-CM | POA: Diagnosis not present

## 2021-01-08 DIAGNOSIS — Z992 Dependence on renal dialysis: Secondary | ICD-10-CM | POA: Diagnosis not present

## 2021-01-08 DIAGNOSIS — E119 Type 2 diabetes mellitus without complications: Secondary | ICD-10-CM | POA: Diagnosis not present

## 2021-01-08 DIAGNOSIS — D649 Anemia, unspecified: Secondary | ICD-10-CM | POA: Diagnosis not present

## 2021-01-08 DIAGNOSIS — N178 Other acute kidney failure: Secondary | ICD-10-CM | POA: Diagnosis not present

## 2021-01-08 DIAGNOSIS — N2581 Secondary hyperparathyroidism of renal origin: Secondary | ICD-10-CM | POA: Diagnosis not present

## 2021-01-08 DIAGNOSIS — Z23 Encounter for immunization: Secondary | ICD-10-CM | POA: Diagnosis not present

## 2021-01-08 DIAGNOSIS — D689 Coagulation defect, unspecified: Secondary | ICD-10-CM | POA: Diagnosis not present

## 2021-01-10 DIAGNOSIS — Z992 Dependence on renal dialysis: Secondary | ICD-10-CM | POA: Diagnosis not present

## 2021-01-10 DIAGNOSIS — N2581 Secondary hyperparathyroidism of renal origin: Secondary | ICD-10-CM | POA: Diagnosis not present

## 2021-01-10 DIAGNOSIS — N186 End stage renal disease: Secondary | ICD-10-CM | POA: Diagnosis not present

## 2021-01-10 DIAGNOSIS — D689 Coagulation defect, unspecified: Secondary | ICD-10-CM | POA: Diagnosis not present

## 2021-01-12 ENCOUNTER — Encounter: Payer: Self-pay | Admitting: Vascular Surgery

## 2021-01-12 ENCOUNTER — Ambulatory Visit: Payer: Medicare HMO | Admitting: Vascular Surgery

## 2021-01-12 ENCOUNTER — Other Ambulatory Visit: Payer: Self-pay

## 2021-01-12 VITALS — BP 120/70 | HR 75 | Temp 98.7°F | Resp 16 | Ht 66.5 in | Wt 195.6 lb

## 2021-01-12 DIAGNOSIS — N186 End stage renal disease: Secondary | ICD-10-CM

## 2021-01-12 NOTE — Progress Notes (Signed)
Vascular and Vein Specialist of [place]  Patient name: Kyle Hicks MRN: [id number] DOB: Jun 10, 1941 Sex: male  REASON FOR CONSULT discuss access for hemodialysis  HPI: Kyle Hicks is a 80 y.o. male, who is here today for discussion of permanent access for hemodialysis.  He had a tunneled hemodialysis catheter placed in March 2022.  He has had no renal recovery and therefore is being seen for permanent access placement.  He has a right IJ catheter reports no difficulty with this.  He has extremely poor understanding of his disease process.  He does not understand that he will be not having surgery today.  He is on chronic anticoagulation with Eliquis due to A. fib.  Does not have a history of pacemaker.  He is right-handed.  Past Medical History:  Diagnosis Date   Diabetes mellitus without complication (HCC)    Diastolic CHF, acute ([place]) 10/08/2020   HTN (hypertension)     Family History  Problem Relation Age of Onset   Hypertension Mother    Hyperlipidemia Mother    Hypertension Father    Hyperlipidemia Father    Colon cancer Neg Hx     SOCIAL HISTORY: Social History   Socioeconomic History   Marital status: Married    Spouse name: Not on file   Number of children: Not on file   Years of education: Not on file   Highest education level: Not on file  Occupational History   Not on file  Tobacco Use   Smoking status: Former    Packs/day: 0.50    Pack years: 0.00    Types: Cigarettes    Quit date: 07/26/1987    Years since quitting: 33.4   Smokeless tobacco: Never  Vaping Use   Vaping Use: Never used  Substance and Sexual Activity   Alcohol use: No    Alcohol/week: 0.0 standard drinks    Comment: Previously drank socially, last drank 25-30 years ago   Drug use: No   Sexual activity: Not on file  Other Topics Concern   Not on file  Social History Narrative   Not on file   Social Determinants of Health   Financial  Resource Strain: Not on file  Food Insecurity: No Food Insecurity   Worried About Running Out of Food in the Last Year: Never true   Ran Out of Food in the Last Year: Never true  Transportation Needs: No Transportation Needs   Lack of Transportation (Medical): No   Lack of Transportation (Non-Medical): No  Physical Activity: Not on file  Stress: Not on file  Social Connections: Not on file  Intimate Partner Violence: Not on file    No Known Allergies  Current Outpatient Medications  Medication Sig Dispense Refill   amiodarone (PACERONE) 100 MG tablet Take 1 tablet (100 mg total) by mouth daily. 30 tablet 6   apixaban (ELIQUIS) 5 MG TABS tablet Take 1 tablet (5 mg total) by mouth 2 (two) times daily. 60 tablet 3   Artificial Tear Ointment (DRY EYES OP) Apply 1 drop to eye daily as needed (for dry eyes).     atorvastatin (LIPITOR) 20 MG tablet Take 1 tablet (20 mg total) by mouth daily. 30 tablet 3   blood glucose meter kit and supplies KIT Dispense based on patient and insurance preference. Use up to 2 times daily as directed. (FOR ICD-10 E11.65) 1 each 5   glipiZIDE (GLUCOTROL) 5 MG tablet Take 1 tablet by mouth once daily with breakfast (  Patient taking differently: Take 5 mg by mouth daily before breakfast.) 90 tablet 0   glucose blood (ACCU-CHEK GUIDE) test strip Use as instructed bid E11.65 100 each 3   Homeopathic Products (AVENOC) OINT Apply 1 application topically daily as needed (foot care).     hydrocortisone cream 1 % Apply 1 application topically 3 (three) times daily as needed for itching (minor skin irritation). 30 g 0   Lancets (ACCU-CHEK MULTICLIX) lancets Use as instructed bid. E11.65 100 each 3   pantoprazole (PROTONIX) 40 MG tablet Take 1 tablet (40 mg total) by mouth daily. 30 tablet 2   torsemide (DEMADEX) 100 MG tablet Take 1 tablet (100 mg total) by mouth daily. 30 tablet 3   No current facility-administered medications for this visit.     PHYSICAL  EXAM: Vitals:   01/12/21 1256  BP: 120/70  Pulse: 75  Resp: 16  Temp: 98.7 F (37.1 C)  TempSrc: Other (Comment)  SpO2: 97%  Weight: 195 lb 9.6 oz (88.7 kg)  Height: 5' 6.5" (1.689 m)    GENERAL: The patient is a well-nourished male, in no acute distress. The vital signs are documented above. CARDIOVASCULAR: 2+ radial pulses bilaterally.  Very poor surface veins bilaterally PULMONARY: There is good air exchange  MUSCULOSKELETAL: There are no major deformities or cyanosis. NEUROLOGIC: No focal weakness or paresthesias are detected. SKIN: There are no ulcers or rashes noted. PSYCHIATRIC: The patient has a normal affect.  DATA:  I imaged his veins bilaterally with SonoSite ultrasound.  This shows very small cephalic vein throughout the left arm.  Small to moderate right upper arm cephalic vein.  He does have a moderate sized left basilic vein  MEDICAL ISSUES: I discussed options with the patient.  Recommend left arm access and most likely this will be Gore-Tex graft versus basilic fistula.  I explained this will be done as an outpatient at [facility].  Also explained that he would need to use his new arm access prior to removing the catheter.  Explained that this would take anywhere from 3 to 6 months to have function on the left arm access.  We will check with cardiology to assure that it is safe to discontinue his Eliquis around the time of his surgery   [name], MD [facility] V[facility] and Vein Specialists of [facility] [phone] Pager [phone]  Note: Portions of this report may have been transcribed using voice recognition software.  Every effort has been made to ensure accuracy; however, inadvertent computerized transcription errors may still be present.

## 2021-01-12 NOTE — H&P (View-Only) (Signed)
Vascular and Vein Specialist of [place]  Patient name: Kyle Hicks MRN: [id number] DOB: 05-Jun-1941 Sex: male  REASON FOR CONSULT discuss access for hemodialysis  HPI: Kyle Hicks is a 80 y.o. male, who is here today for discussion of permanent access for hemodialysis.  He had a tunneled hemodialysis catheter placed in March 2022.  He has had no renal recovery and therefore is being seen for permanent access placement.  He has a right IJ catheter reports no difficulty with this.  He has extremely poor understanding of his disease process.  He does not understand that he will be not having surgery today.  He is on chronic anticoagulation with Eliquis due to A. fib.  Does not have a history of pacemaker.  He is right-handed.  Past Medical History:  Diagnosis Date   Diabetes mellitus without complication (HCC)    Diastolic CHF, acute ([place]) 10/08/2020   HTN (hypertension)     Family History  Problem Relation Age of Onset   Hypertension Mother    Hyperlipidemia Mother    Hypertension Father    Hyperlipidemia Father    Colon cancer Neg Hx     SOCIAL HISTORY: Social History   Socioeconomic History   Marital status: Married    Spouse name: Not on file   Number of children: Not on file   Years of education: Not on file   Highest education level: Not on file  Occupational History   Not on file  Tobacco Use   Smoking status: Former    Packs/day: 0.50    Pack years: 0.00    Types: Cigarettes    Quit date: 07/26/1987    Years since quitting: 33.4   Smokeless tobacco: Never  Vaping Use   Vaping Use: Never used  Substance and Sexual Activity   Alcohol use: No    Alcohol/week: 0.0 standard drinks    Comment: Previously drank socially, last drank 25-30 years ago   Drug use: No   Sexual activity: Not on file  Other Topics Concern   Not on file  Social History Narrative   Not on file   Social Determinants of Health   Financial  Resource Strain: Not on file  Food Insecurity: No Food Insecurity   Worried About Running Out of Food in the Last Year: Never true   Ran Out of Food in the Last Year: Never true  Transportation Needs: No Transportation Needs   Lack of Transportation (Medical): No   Lack of Transportation (Non-Medical): No  Physical Activity: Not on file  Stress: Not on file  Social Connections: Not on file  Intimate Partner Violence: Not on file    No Known Allergies  Current Outpatient Medications  Medication Sig Dispense Refill   amiodarone (PACERONE) 100 MG tablet Take 1 tablet (100 mg total) by mouth daily. 30 tablet 6   apixaban (ELIQUIS) 5 MG TABS tablet Take 1 tablet (5 mg total) by mouth 2 (two) times daily. 60 tablet 3   Artificial Tear Ointment (DRY EYES OP) Apply 1 drop to eye daily as needed (for dry eyes).     atorvastatin (LIPITOR) 20 MG tablet Take 1 tablet (20 mg total) by mouth daily. 30 tablet 3   blood glucose meter kit and supplies KIT Dispense based on patient and insurance preference. Use up to 2 times daily as directed. (FOR ICD-10 E11.65) 1 each 5   glipiZIDE (GLUCOTROL) 5 MG tablet Take 1 tablet by mouth once daily with breakfast (  Patient taking differently: Take 5 mg by mouth daily before breakfast.) 90 tablet 0   glucose blood (ACCU-CHEK GUIDE) test strip Use as instructed bid E11.65 100 each 3   Homeopathic Products (AVENOC) OINT Apply 1 application topically daily as needed (foot care).     hydrocortisone cream 1 % Apply 1 application topically 3 (three) times daily as needed for itching (minor skin irritation). 30 g 0   Lancets (ACCU-CHEK MULTICLIX) lancets Use as instructed bid. E11.65 100 each 3   pantoprazole (PROTONIX) 40 MG tablet Take 1 tablet (40 mg total) by mouth daily. 30 tablet 2   torsemide (DEMADEX) 100 MG tablet Take 1 tablet (100 mg total) by mouth daily. 30 tablet 3   No current facility-administered medications for this visit.     PHYSICAL  EXAM: Vitals:   01/12/21 1256  BP: 120/70  Pulse: 75  Resp: 16  Temp: 98.7 F (37.1 C)  TempSrc: Other (Comment)  SpO2: 97%  Weight: 195 lb 9.6 oz (88.7 kg)  Height: 5' 6.5" (1.689 m)    GENERAL: The patient is a well-nourished male, in no acute distress. The vital signs are documented above. CARDIOVASCULAR: 2+ radial pulses bilaterally.  Very poor surface veins bilaterally PULMONARY: There is good air exchange  MUSCULOSKELETAL: There are no major deformities or cyanosis. NEUROLOGIC: No focal weakness or paresthesias are detected. SKIN: There are no ulcers or rashes noted. PSYCHIATRIC: The patient has a normal affect.  DATA:  I imaged his veins bilaterally with SonoSite ultrasound.  This shows very small cephalic vein throughout the left arm.  Small to moderate right upper arm cephalic vein.  He does have a moderate sized left basilic vein  MEDICAL ISSUES: I discussed options with the patient.  Recommend left arm access and most likely this will be Gore-Tex graft versus basilic fistula.  I explained this will be done as an outpatient at [facility].  Also explained that he would need to use his new arm access prior to removing the catheter.  Explained that this would take anywhere from 3 to 6 months to have function on the left arm access.  We will check with cardiology to assure that it is safe to discontinue his Eliquis around the time of his surgery   [name], MD [facility] Vascular and Vein Specialists of [facility] [phone] Pager [phone]  Note: Portions of this report may have been transcribed using voice recognition software.  Every effort has been made to ensure accuracy; however, inadvertent computerized transcription errors may still be present.

## 2021-01-13 DIAGNOSIS — D689 Coagulation defect, unspecified: Secondary | ICD-10-CM | POA: Diagnosis not present

## 2021-01-13 DIAGNOSIS — N2581 Secondary hyperparathyroidism of renal origin: Secondary | ICD-10-CM | POA: Diagnosis not present

## 2021-01-13 DIAGNOSIS — Z992 Dependence on renal dialysis: Secondary | ICD-10-CM | POA: Diagnosis not present

## 2021-01-13 DIAGNOSIS — N186 End stage renal disease: Secondary | ICD-10-CM | POA: Diagnosis not present

## 2021-01-13 DIAGNOSIS — D509 Iron deficiency anemia, unspecified: Secondary | ICD-10-CM | POA: Diagnosis not present

## 2021-01-14 ENCOUNTER — Other Ambulatory Visit: Payer: Self-pay

## 2021-01-15 DIAGNOSIS — N1831 Chronic kidney disease, stage 3a: Secondary | ICD-10-CM | POA: Diagnosis not present

## 2021-01-15 DIAGNOSIS — Z992 Dependence on renal dialysis: Secondary | ICD-10-CM | POA: Diagnosis not present

## 2021-01-15 DIAGNOSIS — N2581 Secondary hyperparathyroidism of renal origin: Secondary | ICD-10-CM | POA: Diagnosis not present

## 2021-01-15 DIAGNOSIS — D509 Iron deficiency anemia, unspecified: Secondary | ICD-10-CM | POA: Diagnosis not present

## 2021-01-15 DIAGNOSIS — D689 Coagulation defect, unspecified: Secondary | ICD-10-CM | POA: Diagnosis not present

## 2021-01-15 DIAGNOSIS — N186 End stage renal disease: Secondary | ICD-10-CM | POA: Diagnosis not present

## 2021-01-15 DIAGNOSIS — I4892 Unspecified atrial flutter: Secondary | ICD-10-CM | POA: Diagnosis not present

## 2021-01-15 DIAGNOSIS — D631 Anemia in chronic kidney disease: Secondary | ICD-10-CM | POA: Diagnosis not present

## 2021-01-15 DIAGNOSIS — E1122 Type 2 diabetes mellitus with diabetic chronic kidney disease: Secondary | ICD-10-CM | POA: Diagnosis not present

## 2021-01-17 DIAGNOSIS — N186 End stage renal disease: Secondary | ICD-10-CM | POA: Diagnosis not present

## 2021-01-17 DIAGNOSIS — D689 Coagulation defect, unspecified: Secondary | ICD-10-CM | POA: Diagnosis not present

## 2021-01-17 DIAGNOSIS — N2581 Secondary hyperparathyroidism of renal origin: Secondary | ICD-10-CM | POA: Diagnosis not present

## 2021-01-17 DIAGNOSIS — Z992 Dependence on renal dialysis: Secondary | ICD-10-CM | POA: Diagnosis not present

## 2021-01-17 DIAGNOSIS — D509 Iron deficiency anemia, unspecified: Secondary | ICD-10-CM | POA: Diagnosis not present

## 2021-01-19 ENCOUNTER — Encounter (HOSPITAL_COMMUNITY): Payer: Medicare HMO

## 2021-01-19 DIAGNOSIS — K7460 Unspecified cirrhosis of liver: Secondary | ICD-10-CM | POA: Diagnosis not present

## 2021-01-19 DIAGNOSIS — I272 Pulmonary hypertension, unspecified: Secondary | ICD-10-CM | POA: Diagnosis not present

## 2021-01-19 DIAGNOSIS — G9340 Encephalopathy, unspecified: Secondary | ICD-10-CM | POA: Diagnosis not present

## 2021-01-19 DIAGNOSIS — I5023 Acute on chronic systolic (congestive) heart failure: Secondary | ICD-10-CM | POA: Diagnosis not present

## 2021-01-19 DIAGNOSIS — Z6830 Body mass index (BMI) 30.0-30.9, adult: Secondary | ICD-10-CM | POA: Diagnosis not present

## 2021-01-19 DIAGNOSIS — E876 Hypokalemia: Secondary | ICD-10-CM | POA: Diagnosis not present

## 2021-01-19 DIAGNOSIS — Z7901 Long term (current) use of anticoagulants: Secondary | ICD-10-CM | POA: Diagnosis not present

## 2021-01-19 DIAGNOSIS — Z992 Dependence on renal dialysis: Secondary | ICD-10-CM | POA: Diagnosis not present

## 2021-01-19 DIAGNOSIS — E1122 Type 2 diabetes mellitus with diabetic chronic kidney disease: Secondary | ICD-10-CM | POA: Diagnosis not present

## 2021-01-19 DIAGNOSIS — I5030 Unspecified diastolic (congestive) heart failure: Secondary | ICD-10-CM | POA: Diagnosis not present

## 2021-01-19 DIAGNOSIS — I5082 Biventricular heart failure: Secondary | ICD-10-CM | POA: Diagnosis not present

## 2021-01-19 DIAGNOSIS — Z7984 Long term (current) use of oral hypoglycemic drugs: Secondary | ICD-10-CM | POA: Diagnosis not present

## 2021-01-19 DIAGNOSIS — E872 Acidosis: Secondary | ICD-10-CM | POA: Diagnosis not present

## 2021-01-19 DIAGNOSIS — D631 Anemia in chronic kidney disease: Secondary | ICD-10-CM | POA: Diagnosis not present

## 2021-01-19 DIAGNOSIS — E782 Mixed hyperlipidemia: Secondary | ICD-10-CM | POA: Diagnosis not present

## 2021-01-19 DIAGNOSIS — E871 Hypo-osmolality and hyponatremia: Secondary | ICD-10-CM | POA: Diagnosis not present

## 2021-01-19 DIAGNOSIS — I110 Hypertensive heart disease with heart failure: Secondary | ICD-10-CM | POA: Diagnosis not present

## 2021-01-19 DIAGNOSIS — K3189 Other diseases of stomach and duodenum: Secondary | ICD-10-CM | POA: Diagnosis not present

## 2021-01-19 DIAGNOSIS — I4892 Unspecified atrial flutter: Secondary | ICD-10-CM | POA: Diagnosis not present

## 2021-01-19 DIAGNOSIS — I4891 Unspecified atrial fibrillation: Secondary | ICD-10-CM | POA: Diagnosis not present

## 2021-01-19 DIAGNOSIS — N1831 Chronic kidney disease, stage 3a: Secondary | ICD-10-CM | POA: Diagnosis not present

## 2021-01-20 ENCOUNTER — Other Ambulatory Visit: Payer: Self-pay

## 2021-01-20 ENCOUNTER — Encounter (HOSPITAL_COMMUNITY): Payer: Self-pay

## 2021-01-20 ENCOUNTER — Encounter (HOSPITAL_COMMUNITY)
Admission: RE | Admit: 2021-01-20 | Discharge: 2021-01-20 | Disposition: A | Discharge status: Home / Self Care | Payer: Medicare HMO | Source: Ambulatory Visit | Attending: Vascular Surgery | Admitting: Vascular Surgery

## 2021-01-20 DIAGNOSIS — D689 Coagulation defect, unspecified: Secondary | ICD-10-CM | POA: Diagnosis not present

## 2021-01-20 DIAGNOSIS — D509 Iron deficiency anemia, unspecified: Secondary | ICD-10-CM | POA: Diagnosis not present

## 2021-01-20 DIAGNOSIS — N186 End stage renal disease: Secondary | ICD-10-CM | POA: Diagnosis not present

## 2021-01-20 DIAGNOSIS — N2581 Secondary hyperparathyroidism of renal origin: Secondary | ICD-10-CM | POA: Diagnosis not present

## 2021-01-20 DIAGNOSIS — Z992 Dependence on renal dialysis: Secondary | ICD-10-CM | POA: Diagnosis not present

## 2021-01-20 HISTORY — DX: Dependence on renal dialysis: Z99.2

## 2021-01-20 HISTORY — DX: Chronic kidney disease, unspecified: N18.9

## 2021-01-21 ENCOUNTER — Telehealth (HOSPITAL_COMMUNITY): Payer: Self-pay | Admitting: *Deleted

## 2021-01-21 DIAGNOSIS — N186 End stage renal disease: Secondary | ICD-10-CM | POA: Diagnosis not present

## 2021-01-21 DIAGNOSIS — N2581 Secondary hyperparathyroidism of renal origin: Secondary | ICD-10-CM | POA: Diagnosis not present

## 2021-01-21 DIAGNOSIS — D689 Coagulation defect, unspecified: Secondary | ICD-10-CM | POA: Diagnosis not present

## 2021-01-21 DIAGNOSIS — D509 Iron deficiency anemia, unspecified: Secondary | ICD-10-CM | POA: Diagnosis not present

## 2021-01-21 DIAGNOSIS — Z992 Dependence on renal dialysis: Secondary | ICD-10-CM | POA: Diagnosis not present

## 2021-01-21 NOTE — Telephone Encounter (Signed)
[facility] called yesterday for med d/c request. [facility] stated they faxed and called our office 6/24 and was transferred to [facility]. I was out of the office 6/23,6/24, and 6/27.  I looked for the fax and spoke with Nira Conn who stated she did not receive the fax. I asked [facility] to please fax the request again. I still have not received the fax. I called [facility] this morning and left a VM with the surgery scheduler for a call back regarding request for pt.

## 2021-01-21 NOTE — Telephone Encounter (Signed)
Patient scheduled for LEFT ARM ARTERIOVENOUS (AV) FISTULA VERSUS GRAFT CREATION on 6/30. [facility] faxed clearance form last week and asked if ok to hold eliquis 3 days prior to procedure. Our office never received fax so they never received clearance. [facility] said they attempted to fax clearance multiple times but Kyle Hicks  did not see fax and neither did I. [facility] called patient and patient had already held eliquis last dose of eliquis was Sunday night. [facility] needs to know if ok to proceed with procedure. Will route response to [facility].   Routed to [place] for advice

## 2021-01-21 NOTE — Telephone Encounter (Signed)
Per Dr Genevieve Norlander ok to hold Eliquis 3 days prior to surgery, note faxed to [facility]

## 2021-01-22 ENCOUNTER — Encounter (HOSPITAL_COMMUNITY)
Admission: RE | Disposition: A | Discharge status: Home / Self Care | Payer: Self-pay | Source: Home / Self Care | Attending: Vascular Surgery

## 2021-01-22 ENCOUNTER — Other Ambulatory Visit: Payer: Self-pay

## 2021-01-22 ENCOUNTER — Ambulatory Visit (HOSPITAL_COMMUNITY): Payer: Medicare HMO | Admitting: Anesthesiology

## 2021-01-22 ENCOUNTER — Ambulatory Visit (HOSPITAL_COMMUNITY)
Admission: RE | Admit: 2021-01-22 | Discharge: 2021-01-22 | Disposition: A | Discharge status: Home / Self Care | Payer: Medicare HMO | Attending: Vascular Surgery | Admitting: Vascular Surgery

## 2021-01-22 ENCOUNTER — Encounter (HOSPITAL_COMMUNITY): Payer: Self-pay | Admitting: Vascular Surgery

## 2021-01-22 DIAGNOSIS — I509 Heart failure, unspecified: Secondary | ICD-10-CM | POA: Diagnosis not present

## 2021-01-22 DIAGNOSIS — Z7984 Long term (current) use of oral hypoglycemic drugs: Secondary | ICD-10-CM | POA: Insufficient documentation

## 2021-01-22 DIAGNOSIS — Z79899 Other long term (current) drug therapy: Secondary | ICD-10-CM | POA: Insufficient documentation

## 2021-01-22 DIAGNOSIS — N182 Chronic kidney disease, stage 2 (mild): Secondary | ICD-10-CM

## 2021-01-22 DIAGNOSIS — Z87891 Personal history of nicotine dependence: Secondary | ICD-10-CM | POA: Insufficient documentation

## 2021-01-22 DIAGNOSIS — I132 Hypertensive heart and chronic kidney disease with heart failure and with stage 5 chronic kidney disease, or end stage renal disease: Secondary | ICD-10-CM | POA: Diagnosis not present

## 2021-01-22 DIAGNOSIS — Z992 Dependence on renal dialysis: Secondary | ICD-10-CM | POA: Diagnosis not present

## 2021-01-22 DIAGNOSIS — Z7901 Long term (current) use of anticoagulants: Secondary | ICD-10-CM | POA: Insufficient documentation

## 2021-01-22 DIAGNOSIS — N186 End stage renal disease: Secondary | ICD-10-CM | POA: Insufficient documentation

## 2021-01-22 DIAGNOSIS — N179 Acute kidney failure, unspecified: Secondary | ICD-10-CM | POA: Diagnosis not present

## 2021-01-22 HISTORY — PX: AV FISTULA PLACEMENT: SHX1204

## 2021-01-22 LAB — POCT I-STAT, CHEM 8
BUN: 32 mg/dL — ABNORMAL HIGH (ref 8–23)
Calcium, Ion: 1.08 mmol/L — ABNORMAL LOW (ref 1.15–1.40)
Chloride: 96 mmol/L — ABNORMAL LOW (ref 98–111)
Creatinine, Ser: 4.6 mg/dL — ABNORMAL HIGH (ref 0.61–1.24)
Glucose, Bld: 104 mg/dL — ABNORMAL HIGH (ref 70–99)
HCT: 36 % — ABNORMAL LOW (ref 39.0–52.0)
Hemoglobin: 12.2 g/dL — ABNORMAL LOW (ref 13.0–17.0)
Potassium: 4.4 mmol/L (ref 3.5–5.1)
Sodium: 136 mmol/L (ref 135–145)
TCO2: 28 mmol/L (ref 22–32)

## 2021-01-22 LAB — GLUCOSE, CAPILLARY: Glucose-Capillary: 94 mg/dL (ref 70–99)

## 2021-01-22 SURGERY — ARTERIOVENOUS (AV) FISTULA CREATION
Anesthesia: General | Laterality: Left

## 2021-01-22 MED ORDER — PROPOFOL 10 MG/ML IV BOLUS
INTRAVENOUS | Status: DC | PRN
  Administered 2021-01-22 (×2): 20 mg via INTRAVENOUS

## 2021-01-22 MED ORDER — SODIUM CHLORIDE 0.9 % IV SOLN: INTRAVENOUS | Status: DC

## 2021-01-22 MED ORDER — SODIUM CHLORIDE (PF) 0.9 % IJ SOLN
INTRAMUSCULAR | Status: AC
  Filled 2021-01-22: qty 20

## 2021-01-22 MED ORDER — PHENYLEPHRINE HCL-NACL 10-0.9 MG/250ML-% IV SOLN
INTRAVENOUS | Status: DC | PRN
  Administered 2021-01-22: 30 ug/min via INTRAVENOUS

## 2021-01-22 MED ORDER — FENTANYL CITRATE (PF) 100 MCG/2ML IJ SOLN
INTRAMUSCULAR | Status: AC
  Filled 2021-01-22: qty 2

## 2021-01-22 MED ORDER — LIDOCAINE HCL (PF) 2 % IJ SOLN
INTRAMUSCULAR | Status: AC
  Filled 2021-01-22: qty 5

## 2021-01-22 MED ORDER — DIPHENHYDRAMINE HCL 50 MG/ML IJ SOLN
INTRAMUSCULAR | Status: DC | PRN
  Administered 2021-01-22 (×2): 12.5 mg via INTRAVENOUS

## 2021-01-22 MED ORDER — FENTANYL CITRATE (PF) 100 MCG/2ML IJ SOLN
INTRAMUSCULAR | Status: DC | PRN
  Administered 2021-01-22: 25 ug via INTRAVENOUS

## 2021-01-22 MED ORDER — ORAL CARE MOUTH RINSE: 15.0000 mL | Freq: Once | OROMUCOSAL | Status: AC

## 2021-01-22 MED ORDER — BUPIVACAINE-EPINEPHRINE (PF) 0.5% -1:200000 IJ SOLN
INTRAMUSCULAR | Status: DC | PRN
  Administered 2021-01-22: 17 mL

## 2021-01-22 MED ORDER — CHLORHEXIDINE GLUCONATE 0.12 % MT SOLN
15.0000 mL | Freq: Once | OROMUCOSAL | Status: AC
  Administered 2021-01-22: 15 mL via OROMUCOSAL

## 2021-01-22 MED ORDER — ONDANSETRON HCL 4 MG/2ML IJ SOLN: 4.0000 mg | Freq: Once | INTRAMUSCULAR | Status: DC | PRN

## 2021-01-22 MED ORDER — DIPHENHYDRAMINE HCL 50 MG/ML IJ SOLN
INTRAMUSCULAR | Status: AC
  Filled 2021-01-22: qty 1

## 2021-01-22 MED ORDER — PHENYLEPHRINE HCL-NACL 10-0.9 MG/250ML-% IV SOLN
INTRAVENOUS | Status: AC
  Filled 2021-01-22: qty 250

## 2021-01-22 MED ORDER — PHENYLEPHRINE HCL-NACL 10-0.9 MG/250ML-% IV SOLN: INTRAVENOUS | Status: DC | PRN

## 2021-01-22 MED ORDER — ONDANSETRON HCL 4 MG/2ML IJ SOLN
INTRAMUSCULAR | Status: DC | PRN
  Administered 2021-01-22: 4 mg via INTRAVENOUS

## 2021-01-22 MED ORDER — CHLORHEXIDINE GLUCONATE 4 % EX LIQD
60.0000 mL | Freq: Once | CUTANEOUS | Status: AC
  Administered 2021-01-22: 4 via TOPICAL

## 2021-01-22 MED ORDER — LIDOCAINE 2% (20 MG/ML) 5 ML SYRINGE
INTRAMUSCULAR | Status: DC | PRN
  Administered 2021-01-22: 40 mg via INTRAVENOUS

## 2021-01-22 MED ORDER — PROPOFOL 500 MG/50ML IV EMUL
INTRAVENOUS | Status: DC | PRN
  Administered 2021-01-22: 10 ug/kg/min via INTRAVENOUS

## 2021-01-22 MED ORDER — CEFAZOLIN SODIUM-DEXTROSE 2-4 GM/100ML-% IV SOLN
2.0000 g | INTRAVENOUS | Status: AC
  Administered 2021-01-22: 2 g via INTRAVENOUS
  Filled 2021-01-22: qty 100

## 2021-01-22 MED ORDER — HEPARIN 6000 UNIT IRRIGATION SOLUTION
Status: DC | PRN
  Administered 2021-01-22: 1

## 2021-01-22 MED ORDER — CHLORHEXIDINE GLUCONATE 0.12 % MT SOLN: 15.0000 mL | Freq: Once | OROMUCOSAL | Status: AC

## 2021-01-22 MED ORDER — KETAMINE HCL 10 MG/ML IJ SOLN
INTRAMUSCULAR | Status: DC | PRN
  Administered 2021-01-22 (×3): 10 mg via INTRAVENOUS

## 2021-01-22 MED ORDER — 0.9 % SODIUM CHLORIDE (POUR BTL) OPTIME
TOPICAL | Status: DC | PRN
  Administered 2021-01-22: 1000 mL

## 2021-01-22 MED ORDER — KETAMINE HCL 50 MG/5ML IJ SOSY
PREFILLED_SYRINGE | INTRAMUSCULAR | Status: AC
  Filled 2021-01-22: qty 5

## 2021-01-22 MED ORDER — ONDANSETRON HCL 4 MG/2ML IJ SOLN
INTRAMUSCULAR | Status: AC
  Filled 2021-01-22: qty 2

## 2021-01-22 MED ORDER — HEPARIN SODIUM (PORCINE) 1000 UNIT/ML IJ SOLN
INTRAMUSCULAR | Status: AC
  Filled 2021-01-22: qty 6

## 2021-01-22 MED ORDER — PROPOFOL 10 MG/ML IV BOLUS
INTRAVENOUS | Status: AC
  Filled 2021-01-22: qty 20

## 2021-01-22 MED ORDER — CHLORHEXIDINE GLUCONATE 4 % EX LIQD: 60.0000 mL | Freq: Once | CUTANEOUS | Status: DC

## 2021-01-22 MED ORDER — OXYCODONE-ACETAMINOPHEN 5-325 MG PO TABS: 1.0000 | ORAL_TABLET | Freq: Four times a day (QID) | ORAL | 0 refills | Status: DC | PRN

## 2021-01-22 MED ORDER — FENTANYL CITRATE (PF) 100 MCG/2ML IJ SOLN: 25.0000 ug | INTRAMUSCULAR | Status: DC | PRN

## 2021-01-22 MED ORDER — SODIUM CHLORIDE (PF) 0.9 % IJ SOLN
INTRAMUSCULAR | Status: AC
  Filled 2021-01-22: qty 10

## 2021-01-22 MED ORDER — DEXAMETHASONE SODIUM PHOSPHATE 4 MG/ML IJ SOLN
INTRAMUSCULAR | Status: DC | PRN
  Administered 2021-01-22: 4 mg via INTRAVENOUS

## 2021-01-22 SURGICAL SUPPLY — 42 items
ADH SKN CLS APL DERMABOND .7 (GAUZE/BANDAGES/DRESSINGS) ×1
ARMBAND PINK RESTRICT EXTREMIT (MISCELLANEOUS) ×2 IMPLANT
BAG HAMPER (MISCELLANEOUS) ×2 IMPLANT
CANNULA VESSEL 3MM 2 BLNT TIP (CANNULA) ×2 IMPLANT
CLIP LIGATING EXTRA MED SLVR (CLIP) ×2 IMPLANT
CLIP LIGATING EXTRA SM BLUE (MISCELLANEOUS) ×2 IMPLANT
COVER LIGHT HANDLE STERIS (MISCELLANEOUS) ×4 IMPLANT
COVER MAYO STAND XLG (MISCELLANEOUS) ×2 IMPLANT
COVER WAND RF STERILE (DRAPES) ×2 IMPLANT
DECANTER SPIKE VIAL GLASS SM (MISCELLANEOUS) ×2 IMPLANT
DERMABOND ADVANCED (GAUZE/BANDAGES/DRESSINGS) ×1
DERMABOND ADVANCED .7 DNX12 (GAUZE/BANDAGES/DRESSINGS) ×1 IMPLANT
ELECT REM PT RETURN 9FT ADLT (ELECTROSURGICAL) ×2
ELECTRODE REM PT RTRN 9FT ADLT (ELECTROSURGICAL) ×1 IMPLANT
GAUZE SPONGE 4X4 12PLY STRL (GAUZE/BANDAGES/DRESSINGS) ×2 IMPLANT
GLOVE SS BIOGEL STRL SZ 7.5 (GLOVE) ×1 IMPLANT
GLOVE SUPERSENSE BIOGEL SZ 7.5 (GLOVE) ×1
GLOVE SURG UNDER POLY LF SZ7 (GLOVE) ×6 IMPLANT
GOWN STRL REUS W/TWL LRG LVL3 (GOWN DISPOSABLE) ×6 IMPLANT
GRAFT GORETEX STRT 4-7X45 (Vascular Products) ×1 IMPLANT
IV NS 500ML (IV SOLUTION) ×2
IV NS 500ML BAXH (IV SOLUTION) ×1 IMPLANT
KIT BLADEGUARD II DBL (SET/KITS/TRAYS/PACK) ×2 IMPLANT
KIT TURNOVER KIT A (KITS) ×2 IMPLANT
MANIFOLD NEPTUNE II (INSTRUMENTS) ×2 IMPLANT
MARKER SKIN DUAL TIP RULER LAB (MISCELLANEOUS) ×4 IMPLANT
NDL HYPO 18GX1.5 BLUNT FILL (NEEDLE) ×1 IMPLANT
NEEDLE HYPO 18GX1.5 BLUNT FILL (NEEDLE) ×2 IMPLANT
NS IRRIG 1000ML POUR BTL (IV SOLUTION) ×2 IMPLANT
PACK CV ACCESS (CUSTOM PROCEDURE TRAY) ×2 IMPLANT
PAD ARMBOARD 7.5X6 YLW CONV (MISCELLANEOUS) ×4 IMPLANT
SET BASIN LINEN APH (SET/KITS/TRAYS/PACK) ×2 IMPLANT
SOL PREP POV-IOD 4OZ 10% (MISCELLANEOUS) ×2 IMPLANT
SOL PREP PROV IODINE SCRUB 4OZ (MISCELLANEOUS) ×2 IMPLANT
SUT PROLENE 6 0 CC (SUTURE) ×3 IMPLANT
SUT SILK 2 0 SH (SUTURE) ×1 IMPLANT
SUT VIC AB 3-0 SH 27 (SUTURE) ×2
SUT VIC AB 3-0 SH 27X BRD (SUTURE) ×1 IMPLANT
SYR 10ML LL (SYRINGE) ×2 IMPLANT
SYR BULB IRRIG 60ML STRL (SYRINGE) ×2 IMPLANT
SYR CONTROL 10ML LL (SYRINGE) ×2 IMPLANT
UNDERPAD 30X36 HEAVY ABSORB (UNDERPADS AND DIAPERS) ×2 IMPLANT

## 2021-01-22 NOTE — Op Note (Signed)
    OPERATIVE REPORT  DATE OF SURGERY: 01/22/2021  PATIENT: Kyle Hicks, 80 y.o. male MRN: [id number]  DOB: July 23, 1941  PRE-OPERATIVE DIAGNOSIS: End-stage renal disease  POST-OPERATIVE DIAGNOSIS:  Same  PROCEDURE: Left upper arm AV Gore-Tex graft  SURGEON:  Curt Jews, M.D.  PHYSICIAN ASSISTANT: Vevelyn Royals, RN  The assistant was needed for exposure and to expedite the case  ANESTHESIA: Local with sedation  EBL: per anesthesia record  Total I/O In: 400 [I.V.:300; IV Piggyback:100] Out: -   BLOOD ADMINISTERED: none  DRAINS: none  SPECIMEN: none  COUNTS CORRECT:  YES  PATIENT DISPOSITION:  PACU - hemodynamically stable  PROCEDURE DETAILS: The patient was taken operating placed supine position where the area of the left arm was prepped and draped in usual sterile fashion.  Incision was made over the antecubital space and carried down to isolate the brachial artery which was of excellent caliber.  A separate incision was made over the pulse in the axilla and the axillary vein was identified and was also of good size.  A tunnel was created between the antecubital space and the axilla and a 4 x 7 mm tapered Gore-Tex graft was brought through the tunnel.  The brachial artery was occluded proximally and distally and was opened with an 11 blade and extended longitudinally with Potts scissors.  A small arteriotomy was created to reduce risk of steal.  The graft was brought through the tunnel and the 4 mm portion of the graft was excised back to approximately 5 mm diameter and was spatulated and sewn end-to-side to the artery with a running 6-0 Prolene suture.  This anastomosis was tested and found to be adequate.  The graft was flushed with heparinized saline and reoccluded.  The axillary vein was occluded proximally and distally and was opened with an 11 blade and extended longitudinally with Potts scissors.  The 7 mm portion of the graft was cut to the appropriate length and was  spatulated and sewn end-to-side to the artery with a running 6-0 Prolene suture.  Clamps were removed and excellent thrill was noted.  The patient maintained a radial pulse but this was augmented with occlusion of the graft.  The wounds were irrigated with saline.  Hemostasis to electrocautery.  The wounds were closed with 3-0 Vicryl in the subcutaneous and subcuticular tissue.  Sterile dressing was applied and the patient was transferred to the recovery room in stable condition   [name], M.D., [facility] 01/22/2021 10:47 AM  Note: Portions of this report may have been transcribed using voice recognition software.  Every effort has been made to ensure accuracy; however, inadvertent computerized transcription errors may still be present.

## 2021-01-22 NOTE — Interval H&P Note (Signed)
History and Physical Interval Note:  01/22/2021 8:48 AM  Kyle Hicks  has presented today for surgery, with the diagnosis of ESRD.  The various methods of treatment have been discussed with the patient and family. After consideration of risks, benefits and other options for treatment, the patient has consented to  Procedure(s): LEFT ARM ARTERIOVENOUS (AV) FISTULA VERSUS GRAFT CREATION (Left) as a surgical intervention.  The patient's history has been reviewed, patient examined, no change in status, stable for surgery.  I have reviewed the patient's chart and labs.  Questions were answered to the patient's satisfaction.     Curt Jews

## 2021-01-22 NOTE — Anesthesia Preprocedure Evaluation (Signed)
Anesthesia Evaluation  Patient identified by MRN, date of birth, ID band Patient awake    Reviewed: Allergy & Precautions, H&P , NPO status , Patient's Chart, lab work & pertinent test results, reviewed documented beta blocker date and time   Airway Mallampati: II  TM Distance: >3 FB Neck ROM: full    Dental no notable dental hx.    Pulmonary neg pulmonary ROS, former smoker,    Pulmonary exam normal breath sounds clear to auscultation       Cardiovascular Exercise Tolerance: Good hypertension, +CHF   Rhythm:regular Rate:Normal     Neuro/Psych negative neurological ROS  negative psych ROS   GI/Hepatic Neg liver ROS, PUD,   Endo/Other  negative endocrine ROSdiabetes  Renal/GU negative Renal ROS  negative genitourinary   Musculoskeletal   Abdominal   Peds  Hematology  (+) Blood dyscrasia, anemia ,   Anesthesia Other Findings 1. Diffuse hypokinesis abnormal septal motion . Left ventricular ejection fraction, by estimation, is 25%. The left ventricle has normal function. The left ventricle demonstrates global hypokinesis. The left ventricular internal cavity size was moderately dilated. Left ventricular diastolic parameters are consistent with Grade II diastolic dysfunction (pseudonormalization). Elevated left ventricular end-diastolic pressure. 2. Right ventricular systolic function is moderately reduced. The right ventricular size is moderately enlarged. There is moderately elevated pulmonary artery systolic pressure. 3. The mitral valve is normal in structure. Moderate mitral valve regurgitation. No evidence of mitral stenosis. 4. Tricuspid valve regurgitation is moderate. 5. The aortic valve is tricuspid. Aortic valve regurgitation is mild. Mild aortic valve sclerosis is present, with no evidence of aortic valve stenosis. 6. The inferior vena cava is dilated in size with <50% respiratory variability, suggesting  right atrial pressure of 15 mmHg.  Reproductive/Obstetrics negative OB ROS                             Anesthesia Physical Anesthesia Plan  ASA: 3  Anesthesia Plan: General   Post-op Pain Management:    Induction:   PONV Risk Score and Plan: Propofol infusion  Airway Management Planned:   Additional Equipment:   Intra-op Plan:   Post-operative Plan:   Informed Consent: I have reviewed the patients History and Physical, chart, labs and discussed the procedure including the risks, benefits and alternatives for the proposed anesthesia with the patient or authorized representative who has indicated his/her understanding and acceptance.     Dental Advisory Given  Plan Discussed with: CRNA  Anesthesia Plan Comments:         Anesthesia Quick Evaluation

## 2021-01-22 NOTE — Discharge Instructions (Signed)
Vascular and Vein Specialists of [facility]  Discharge Instructions  AV Fistula or Graft Surgery for Dialysis Access  Please refer to the following instructions for your post-procedure care. Your surgeon or physician assistant will discuss any changes with you.  Activity  You may drive the day following your surgery, if you are comfortable and no longer taking prescription pain medication. Resume full activity as the soreness in your incision resolves.  Bathing/Showering  You may shower after you go home. Keep your incision dry for 48 hours. Do not soak in a bathtub, hot tub, or swim until the incision heals completely. You may not shower if you have a hemodialysis catheter.  Incision Care  Clean your incision with mild soap and water after 48 hours. Pat the area dry with a clean towel. You do not need a bandage unless otherwise instructed. Do not apply any ointments or creams to your incision. You may have skin glue on your incision. Do not peel it off. It will come off on its own in about one week. Your arm may swell a bit after surgery. To reduce swelling use pillows to elevate your arm so it is above your heart. Your doctor will tell you if you need to lightly wrap your arm with an ACE bandage.  Diet  Resume your normal diet. There are not special food restrictions following this procedure. In order to heal from your surgery, it is CRITICAL to get adequate nutrition. Your body requires vitamins, minerals, and protein. Vegetables are the best source of vitamins and minerals. Vegetables also provide the perfect balance of protein. Processed food has little nutritional value, so try to avoid this.  Medications  Resume taking all of your medications. If your incision is causing pain, you may take over-the counter pain relievers such as acetaminophen (Tylenol). If you were prescribed a stronger pain medication, please be aware these medications can cause nausea and constipation. Prevent  nausea by taking the medication with a snack or meal. Avoid constipation by drinking plenty of fluids and eating foods with high amount of fiber, such as fruits, vegetables, and grains.  Do not take Tylenol if you are taking prescription pain medications.  Follow up Your surgeon may want to see you in the office following your access surgery. If so, this will be arranged at the time of your surgery.  Please call us immediately for any of the following conditions:  Increased pain, redness, drainage (pus) from your incision site Fever of 101 degrees or higher Severe or worsening pain at your incision site Hand pain or numbness.  Reduce your risk of vascular disease:  Stop smoking. If you would like help, call [facility] at 1-800-QUIT-NOW 902-631-3908) or [place] [facility] [place] your cholesterol Maintain a desired weight Control your diabetes Keep your blood pressure down  Dialysis  It will take several weeks to several months for your new dialysis access to be ready for use. Your surgeon will determine when it is okay to use it. Your nephrologist will continue to direct your dialysis. You can continue to use your Permcath until your new access is ready for use.   01/22/2021 Kyle Hicks [id number] 03/12/1941  Surgeon(s): Noemy Hallmon, Arvilla Meres, MD  Procedure(s): LEFT ARM ARTERIOVENOUS (AV) FISTULA VERSUS GRAFT CREATION   May stick graft immediately   May stick graft on designated area only:    Do not stick graft for 4 weeks    If you have any questions, please call the office  at [phone].

## 2021-01-22 NOTE — Anesthesia Postprocedure Evaluation (Signed)
Anesthesia Post Note  Patient: Kyle Hicks  Procedure(s) Performed: LEFT UPPER ARM ARTERIOVENOUS GORE-TEX GRAFT (Left)  Patient location during evaluation: Phase II Anesthesia Type: General Level of consciousness: awake Pain management: pain level controlled Vital Signs Assessment: post-procedure vital signs reviewed and stable Respiratory status: spontaneous breathing and respiratory function stable Cardiovascular status: blood pressure returned to baseline and stable Postop Assessment: no headache and no apparent nausea or vomiting Anesthetic complications: no Comments: Late entry   No notable events documented.   Last Vitals:  Vitals:   01/22/21 1130 01/22/21 1141  BP: 134/65 (!) 161/52  Pulse: 62 64  Resp: 12 18  Temp:  36.5 C  SpO2: 100% 98%    Last Pain:  Vitals:   01/22/21 1141  TempSrc: Oral  PainSc: 0-No pain                 Louann Sjogren

## 2021-01-22 NOTE — Transfer of Care (Signed)
Immediate Anesthesia Transfer of Care Note  Patient: Kyle Hicks  Procedure(s) Performed: LEFT ARM ARTERIOVENOUS (AV) FISTULA VERSUS GRAFT CREATION (Left)  Patient Location: PACU  Anesthesia Type:General  Level of Consciousness: awake, alert  and oriented  Airway & Oxygen Therapy: Patient Spontanous Breathing  Post-op Assessment: Report given to RN, Post -op Vital signs reviewed and stable and Patient moving all extremities X 4  Post vital signs: Reviewed and stable  Last Vitals:  Vitals Value Taken Time  BP 136/67 01/22/21 1051  Temp    Pulse 62 01/22/21 1052  Resp 10 01/22/21 1052  SpO2 100 % 01/22/21 1052  Vitals shown include unvalidated device data.  Last Pain:  Vitals:   01/22/21 0801  TempSrc: Oral  PainSc: 0-No pain         Complications: No notable events documented.

## 2021-01-22 NOTE — Anesthesia Procedure Notes (Signed)
Date/Time: 01/22/2021 9:23 AM Performed by: Orlie Dakin, CRNA Pre-anesthesia Checklist: Patient identified, Emergency Drugs available, Suction available and Patient being monitored Patient Re-evaluated:Patient Re-evaluated prior to induction Oxygen Delivery Method: Non-rebreather mask Induction Type: IV induction Placement Confirmation: positive ETCO2

## 2021-01-22 NOTE — Progress Notes (Addendum)
Pt jewelry and dentures taken to PACU, dentures would not fit into pink cup, placed in clear bag

## 2021-01-23 ENCOUNTER — Encounter (HOSPITAL_COMMUNITY): Payer: Self-pay | Admitting: Vascular Surgery

## 2021-01-24 DIAGNOSIS — N2581 Secondary hyperparathyroidism of renal origin: Secondary | ICD-10-CM | POA: Diagnosis not present

## 2021-01-24 DIAGNOSIS — Z992 Dependence on renal dialysis: Secondary | ICD-10-CM | POA: Diagnosis not present

## 2021-01-24 DIAGNOSIS — N186 End stage renal disease: Secondary | ICD-10-CM | POA: Diagnosis not present

## 2021-01-24 DIAGNOSIS — D689 Coagulation defect, unspecified: Secondary | ICD-10-CM | POA: Diagnosis not present

## 2021-01-27 DIAGNOSIS — N2581 Secondary hyperparathyroidism of renal origin: Secondary | ICD-10-CM | POA: Diagnosis not present

## 2021-01-27 DIAGNOSIS — D689 Coagulation defect, unspecified: Secondary | ICD-10-CM | POA: Diagnosis not present

## 2021-01-27 DIAGNOSIS — N186 End stage renal disease: Secondary | ICD-10-CM | POA: Diagnosis not present

## 2021-01-27 DIAGNOSIS — Z992 Dependence on renal dialysis: Secondary | ICD-10-CM | POA: Diagnosis not present

## 2021-01-27 DIAGNOSIS — D509 Iron deficiency anemia, unspecified: Secondary | ICD-10-CM | POA: Diagnosis not present

## 2021-01-29 ENCOUNTER — Other Ambulatory Visit: Payer: Self-pay | Admitting: *Deleted

## 2021-01-29 DIAGNOSIS — N2581 Secondary hyperparathyroidism of renal origin: Secondary | ICD-10-CM | POA: Diagnosis not present

## 2021-01-29 DIAGNOSIS — Z992 Dependence on renal dialysis: Secondary | ICD-10-CM | POA: Diagnosis not present

## 2021-01-29 DIAGNOSIS — D689 Coagulation defect, unspecified: Secondary | ICD-10-CM | POA: Diagnosis not present

## 2021-01-29 DIAGNOSIS — N186 End stage renal disease: Secondary | ICD-10-CM | POA: Diagnosis not present

## 2021-01-29 DIAGNOSIS — D509 Iron deficiency anemia, unspecified: Secondary | ICD-10-CM | POA: Diagnosis not present

## 2021-01-29 NOTE — Patient Outreach (Signed)
[place] [facility]) Care Management  01/29/2021  Kyle Hicks 07-05-1941 [id number]   Outgoing call placed to member/wife, no answer, HIPAA compliant voice message left.  Will follow up within the next 3-4 business days.  Valente David, [place], MSN [place] [phone]

## 2021-01-31 DIAGNOSIS — D689 Coagulation defect, unspecified: Secondary | ICD-10-CM | POA: Diagnosis not present

## 2021-01-31 DIAGNOSIS — Z992 Dependence on renal dialysis: Secondary | ICD-10-CM | POA: Diagnosis not present

## 2021-01-31 DIAGNOSIS — D509 Iron deficiency anemia, unspecified: Secondary | ICD-10-CM | POA: Diagnosis not present

## 2021-01-31 DIAGNOSIS — N186 End stage renal disease: Secondary | ICD-10-CM | POA: Diagnosis not present

## 2021-01-31 DIAGNOSIS — N2581 Secondary hyperparathyroidism of renal origin: Secondary | ICD-10-CM | POA: Diagnosis not present

## 2021-02-03 ENCOUNTER — Other Ambulatory Visit: Payer: Self-pay | Admitting: *Deleted

## 2021-02-03 DIAGNOSIS — Z992 Dependence on renal dialysis: Secondary | ICD-10-CM | POA: Diagnosis not present

## 2021-02-03 DIAGNOSIS — D689 Coagulation defect, unspecified: Secondary | ICD-10-CM | POA: Diagnosis not present

## 2021-02-03 DIAGNOSIS — D509 Iron deficiency anemia, unspecified: Secondary | ICD-10-CM | POA: Diagnosis not present

## 2021-02-03 DIAGNOSIS — N186 End stage renal disease: Secondary | ICD-10-CM | POA: Diagnosis not present

## 2021-02-03 DIAGNOSIS — N2581 Secondary hyperparathyroidism of renal origin: Secondary | ICD-10-CM | POA: Diagnosis not present

## 2021-02-03 NOTE — Patient Outreach (Signed)
[place] [facility]) Care Management  02/03/2021  Kyle Hicks 1941-02-19 [id number]  Referral for medication assistance from [facility], RN sent to [place] [facility] Management Assistant [phone]

## 2021-02-03 NOTE — Patient Outreach (Signed)
[place] [facility]) Care Management  [place]  02/03/2021   Kyle Hicks 02-Mar-1941 [id number]   Outreach attempt #2, successful to wife.  Report member is "pretty good."  He has been able to drive short distances, continue to have the help from family.  Denies any urgent concerns, encouraged to contact this care manager with questions.  Agrees to follow up within the next month.   Encounter Medications:  Outpatient Encounter Medications as of 02/03/2021  Medication Sig   amiodarone (PACERONE) 100 MG tablet Take 1 tablet (100 mg total) by mouth daily.   apixaban (ELIQUIS) 5 MG TABS tablet Take 1 tablet (5 mg total) by mouth 2 (two) times daily.   atorvastatin (LIPITOR) 20 MG tablet Take 1 tablet (20 mg total) by mouth daily.   blood glucose meter kit and supplies KIT Dispense based on patient and insurance preference. Use up to 2 times daily as directed. (FOR ICD-10 E11.65)   glipiZIDE (GLUCOTROL) 5 MG tablet Take 1 tablet by mouth once daily with breakfast (Patient taking differently: Take 5 mg by mouth daily before breakfast.)   glucose blood (ACCU-CHEK GUIDE) test strip Use as instructed bid E11.65   hydrocortisone cream 1 % Apply 1 application topically 3 (three) times daily as needed for itching (minor skin irritation). (Patient taking differently: Apply 1 application topically daily.)   Lancets (ACCU-CHEK MULTICLIX) lancets Use as instructed bid. E11.65   oxyCODONE-acetaminophen (PERCOCET) 5-325 MG tablet Take 1 tablet by mouth every 6 (six) hours as needed for severe pain.   pantoprazole (PROTONIX) 40 MG tablet Take 1 tablet (40 mg total) by mouth daily.   Polyethyl Glycol-Propyl Glycol (SYSTANE) 0.4-0.3 % SOLN Place 1 drop into both eyes daily as needed (Eye irritation).   torsemide (DEMADEX) 100 MG tablet Take 1 tablet (100 mg total) by mouth daily.   No facility-administered encounter medications on file as of 02/03/2021.    Functional Status:  In your  present state of health, do you have any difficulty performing the following activities: 01/20/2021 10/07/2020  Hearing? [name]  Vision? N N  Difficulty concentrating or making decisions? N N  Walking or climbing stairs? N Y  Dressing or bathing? N N  Doing errands, shopping? N N  Some recent data might be hidden    Fall/Depression Screening: Fall Risk  11/05/2020 09/19/2018 05/19/2018  Falls in the past year? 1 0 No  Number falls in past yr: 0 - -  Injury with Fall? 0 - -  Follow up - Falls evaluation completed -   PHQ 2/9 Scores 11/05/2020 05/19/2018 02/13/2018 06/28/2017 03/14/2017 11/03/2016 08/03/2016  PHQ - 2 Score - 0 0 0 0 0 0  Exception Documentation Other- indicate reason in comment box - - - - - -    Assessment:   [place] : Heart Failure (Adult)  Updates made by Valente David, RN since 02/03/2021 12:00 AM     Problem: Disease Progression (Heart Failure)      Goal: Comorbidities Identified and Managed   This Visit's Progress: On track  Recent Progress: On track  Note:   5/2 - Assessed and addressed signs/symptoms of comorbidity, including dyslipidemia, diabetes, iron deficiency, gout, arthritis, dysrhythmia, hypertension, cachexia, coronary artery disease, kidney dysfunction and lung disease.   Prepared patient for laboratory and diagnostic exams based on risk and presentation. (Appointment with cardiology on 5/16)     Task: Identify and Manage Comorbidities   Note:   Care Management Activities:    -  medication side effects monitored and managed    Notes:     Long-Range Goal: Health Optimized   Start Date: 11/05/2020  Expected End Date: 02/03/2021  This Visit's Progress: On track  Recent Progress: On track  Priority: High  Note:   5/2 - Assessed need for potential diet and fluid modification, such as reduced sodium or fluid intake.   Facilitate home monitoring of weight.  Today was 187.2 pounds      Goals Addressed             This Visit's  Progress    THN - Find Help in My Community       Timeframe:  Short-Term Goal Priority:  Medium Start Date:       7/12                      Expected End Date:     8/12  Barriers: Financial                   Follow Up Date 03/03/2021    - follow-up on any referrals for help I am given    Why is this important?   Knowing how and where to find help for yourself or family in your neighborhood and community is an important skill.  You will want to take some steps to learn how.    Notes:   7/12 - Referral placed to Pharmacy team ([facility]) as wife report [name] is costing $248 to fill      [facility] - Track and Manage Fluids and Swelling-Heart Failure   On track    Timeframe:  Short-Term Goal Priority:  High Start Date:       4/13                      Expected End Date: 5/13                      Barriers: Knowledge    - keep legs up while sitting - track weight in diary - weigh myself daily    Why is this important?   It is important to check your weight daily and watch how much salt and liquids you have.  It will help you to manage your heart failure.    Notes:   5/2 - Weight today was 187.2, yesterday was 187.4 pounds.  Educated on when to contact provider  6/2 - Per wife, weight has remained down, no gain, no shortness of breath or swelling.  7/12 - Wife report member continues to be active with HD, managing fluid levels      THN - Track and Manage Symptoms-Heart Failure   On track    Timeframe:  Long-Range Goal Priority:  High Start Date:         4/13                    Expected End Date:    10/13                  Barriers: Knowledge   - begin a heart failure diary - develop a rescue plan - know when to call the doctor    Why is this important?   You will be able to handle your symptoms better if you keep track of them.  Making some simple changes to your lifestyle will help.  Eating healthy is one thing you can do to  take good care of yourself.    Notes:    5/2 - Discussed follow up appointment with cardiology on 5/16.  Advised that [place] transportation team will contact them for details on arrangements.  Advised to take logs to appointment  6/2 - Was seen by cardiology last week, wife report appointment went well, remains in NSR, denies chest pain or discomfort  7/12 -Has follow up appointment on 7/22 with cardiology and on 8/3 with vascular.  Wife will continue to encouraged daily weights, blood pressure, and HR checks         Plan:  Follow-up: Patient agrees to Care Plan and Follow-up. Follow-up in 1 month(s).  Valente David, [place], MSN [place] [phone]

## 2021-02-04 ENCOUNTER — Other Ambulatory Visit: Payer: Medicare HMO

## 2021-02-04 ENCOUNTER — Encounter: Payer: Medicare HMO | Admitting: Vascular Surgery

## 2021-02-05 DIAGNOSIS — N2581 Secondary hyperparathyroidism of renal origin: Secondary | ICD-10-CM | POA: Diagnosis not present

## 2021-02-05 DIAGNOSIS — N186 End stage renal disease: Secondary | ICD-10-CM | POA: Diagnosis not present

## 2021-02-05 DIAGNOSIS — D509 Iron deficiency anemia, unspecified: Secondary | ICD-10-CM | POA: Diagnosis not present

## 2021-02-05 DIAGNOSIS — D689 Coagulation defect, unspecified: Secondary | ICD-10-CM | POA: Diagnosis not present

## 2021-02-05 DIAGNOSIS — Z992 Dependence on renal dialysis: Secondary | ICD-10-CM | POA: Diagnosis not present

## 2021-02-07 DIAGNOSIS — Z992 Dependence on renal dialysis: Secondary | ICD-10-CM | POA: Diagnosis not present

## 2021-02-07 DIAGNOSIS — D509 Iron deficiency anemia, unspecified: Secondary | ICD-10-CM | POA: Diagnosis not present

## 2021-02-07 DIAGNOSIS — D689 Coagulation defect, unspecified: Secondary | ICD-10-CM | POA: Diagnosis not present

## 2021-02-07 DIAGNOSIS — N186 End stage renal disease: Secondary | ICD-10-CM | POA: Diagnosis not present

## 2021-02-07 DIAGNOSIS — N2581 Secondary hyperparathyroidism of renal origin: Secondary | ICD-10-CM | POA: Diagnosis not present

## 2021-02-10 DIAGNOSIS — E119 Type 2 diabetes mellitus without complications: Secondary | ICD-10-CM | POA: Diagnosis not present

## 2021-02-10 DIAGNOSIS — D689 Coagulation defect, unspecified: Secondary | ICD-10-CM | POA: Diagnosis not present

## 2021-02-10 DIAGNOSIS — N2581 Secondary hyperparathyroidism of renal origin: Secondary | ICD-10-CM | POA: Diagnosis not present

## 2021-02-10 DIAGNOSIS — E039 Hypothyroidism, unspecified: Secondary | ICD-10-CM | POA: Diagnosis not present

## 2021-02-10 DIAGNOSIS — D509 Iron deficiency anemia, unspecified: Secondary | ICD-10-CM | POA: Diagnosis not present

## 2021-02-10 DIAGNOSIS — Z992 Dependence on renal dialysis: Secondary | ICD-10-CM | POA: Diagnosis not present

## 2021-02-10 DIAGNOSIS — N186 End stage renal disease: Secondary | ICD-10-CM | POA: Diagnosis not present

## 2021-02-12 ENCOUNTER — Other Ambulatory Visit: Payer: Self-pay | Admitting: "Endocrinology

## 2021-02-12 DIAGNOSIS — D509 Iron deficiency anemia, unspecified: Secondary | ICD-10-CM | POA: Diagnosis not present

## 2021-02-12 DIAGNOSIS — Z992 Dependence on renal dialysis: Secondary | ICD-10-CM | POA: Diagnosis not present

## 2021-02-12 DIAGNOSIS — N186 End stage renal disease: Secondary | ICD-10-CM | POA: Diagnosis not present

## 2021-02-12 DIAGNOSIS — D689 Coagulation defect, unspecified: Secondary | ICD-10-CM | POA: Diagnosis not present

## 2021-02-12 DIAGNOSIS — N2581 Secondary hyperparathyroidism of renal origin: Secondary | ICD-10-CM | POA: Diagnosis not present

## 2021-02-12 DIAGNOSIS — E039 Hypothyroidism, unspecified: Secondary | ICD-10-CM | POA: Diagnosis not present

## 2021-02-12 DIAGNOSIS — E119 Type 2 diabetes mellitus without complications: Secondary | ICD-10-CM | POA: Diagnosis not present

## 2021-02-12 NOTE — Progress Notes (Addendum)
AD[facility]ANCED [facility] FAILURE CLINIC PCP: Dr Hilma Favors Nephrology: Dr Marval Regal Primary HF Cardiologist: Dr Haroldine Laws    HPI: [name] is a 80 year old with history of HTN, DMII, chronic biventricular heart failure, A flutter, AKI/ESRD, PUD, and GI bleed.    Echo in 2018 and EF was 55%.    Presented to [facility] 10/07/20 with weakness and diarrhea. EKG showed A flutter RVR. Hospital course complicated by AKI and acute Biventricular Heart Failure. Echo with LVEF 10-15% and severely reduced RV.  Attempted to diurese with IV lasix and milrinone but ultimately developed AKI. Placed on HD and was able to tolerate. L/RHC showed mild nonobstuctive CAD 40% LAD and preserved cardiac output.  He failed cardioversion but chemically converted with amiodarone. Nephrology recommended continuing torsemide 100 mg at discharge.   Today he returns for HF follow up with his wife. Last appointment 5/22 he had more energy and getting around ok. Today, overall feeling fine. Denies increasing SOB, CP, dizziness, edema, or PND/Orthopnea. Appetite ok. No fever or chills. Weight at home 184-188 pounds. Taking all medications. Holding meds before HD, no problem with low BPs.  Cardiac Testing  - Echo (3/22): EF 10-15% RV severely reduced. IVC dilated suggesting RA pressure 15. - Echo (2018): EF 55%.    - TEE (3//22): w/ severe Biv dysfunction, Mod-severe MR, severe TR. No LAA thrombus. DCCV unsuccessful   - RHC 10/24/20 Ao = 89/52 (68) RA = 10 RV = 45/12 PA = 47/17 (28) PCW = 9 Fick cardiac output/index = 6.6/3.1 PVR =2.9 FA sat = 98% PA sat = 65%, 67%   Assessment: 1. 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 RCA not injected 2. Mild PAH 3. 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  ROS: All systems negative except as listed in HPI, PMH and Problem List.  SH:  Social History   Socioeconomic History   Marital status: Married    Spouse name: Not on file   Number of  children: Not on file   Years of education: Not on file   Highest education level: Not on file  Occupational History   Not on file  Tobacco Use   Smoking status: Former    Packs/day: 0.50    Types: Cigarettes    Quit date: 07/26/1987    Years since quitting: 33.5   Smokeless tobacco: Never  Vaping Use   Vaping Use: Never used  Substance and Sexual Activity   Alcohol use: No    Alcohol/week: 0.0 standard drinks    Comment: Previously drank socially, last drank 25-30 years ago   Drug use: No   Sexual activity: Not on file  Other Topics Concern   Not on file  Social History Narrative   Not on file   Social Determinants of Health   Financial Resource Strain: Not on file  Food Insecurity: No Food Insecurity   Worried About Running Out of Food in the Last Year: Never true   [place] in the Last Year: Never true  Transportation Needs: No Transportation Needs   Lack of Transportation (Medical): No   Lack of Transportation (Non-Medical): No  Physical Activity: Not on file  Stress: Not on file  Social Connections: Not on file  Intimate Partner Violence: Not on file   FH:  Family History  Problem Relation Age of Onset   Hypertension Mother    Hyperlipidemia Mother    Hypertension Father    Hyperlipidemia Father    Colon  cancer Neg Hx    Past Medical History:  Diagnosis Date   Chronic kidney disease    Diabetes mellitus without complication ([place])    Dialysis patient ([place])    Diastolic CHF, acute ([place]) 10/08/2020   HTN (hypertension)    Current Outpatient Medications  Medication Sig Dispense Refill   amiodarone (PACERONE) 100 MG tablet Take 1 tablet (100 mg total) by mouth daily. 30 tablet 6   apixaban (ELIQUIS) 5 MG TABS tablet Take 1 tablet (5 mg total) by mouth 2 (two) times daily. 60 tablet 3   atorvastatin (LIPITOR) 20 MG tablet Take 1 tablet (20 mg total) by mouth daily. 30 tablet 3   blood glucose meter kit and supplies KIT Dispense based on patient and  insurance preference. Use up to 2 times daily as directed. (FOR ICD-10 E11.65) 1 each 5   glipiZIDE (GLUCOTROL) 5 MG tablet Take 1 tablet by mouth once daily with breakfast 30 tablet 0   glucose blood (ACCU-CHEK GUIDE) test strip Use as instructed bid E11.65 100 each 3   hydrocortisone cream 1 % Apply 1 application topically 3 (three) times daily as needed for itching (minor skin irritation). 30 g 0   Lancets (ACCU-CHEK MULTICLIX) lancets Use as instructed bid. E11.65 100 each 3   [name] CARBONATE PO 500 mg. Patient is taking 500 mg tablet (chewable)3 times daily.     oxyCODONE-acetaminophen (PERCOCET) 5-325 MG tablet Take 1 tablet by mouth every 6 (six) hours as needed for severe pain. 8 tablet 0   Polyethyl Glycol-Propyl Glycol (SYSTANE) 0.4-0.3 % SOLN Place 1 drop into both eyes daily as needed (Eye irritation).     torsemide (DEMADEX) 100 MG tablet Take 1 tablet (100 mg total) by mouth daily. 30 tablet 3   No current facility-administered medications for this encounter.   BP 122/60   Pulse 70   Wt 90 kg (198 lb 6.4 oz)   SpO2 100%   BMI 31.54 kg/m   Wt Readings from Last 3 Encounters:  02/13/21 90 kg (198 lb 6.4 oz)  01/12/21 88.7 kg (195 lb 9.6 oz)  12/08/20 87.7 kg (193 lb 6.4 oz)   PHYSICAL EXAM: General:  NAD. No resp difficulty, elderly HEENT: Normal Neck: Supple. No JVD. Carotids 2+ bilat; no bruits. No lymphadenopathy or thryomegaly appreciated. Cor: PMI nondisplaced. Regular rate & rhythm. No rubs, gallops or murmurs. Lungs: Clear, right upper chest TDC Abdomen: Soft, nontender, nondistended. No hepatosplenomegaly. No bruits or masses. Good bowel sounds. Extremities: No cyanosis, clubbing, rash, edema, LUE fistula +/+ Neuro: Alert & oriented x 3, cranial nerves grossly intact. Moves all 4 extremities w/o difficulty. Affect pleasant.  ASSESSMENT & PLAN: 1. PAF (A Flutter) w/ RVR - Regular on exam today. - Continue amio 100 mg daily.  - Continue Eliquis 5 mg bid. -  Check TSH/LFTs.    2. Chronic Biventricular Heart Failure - Echo (2018): EF 55%.  - Echo (3/22): EF < 15%. Suspect tachy-mediated. No cath with creatinine > 6. - RHC (3/22): with elevated filling pressures R>L and preserved cardiac output.  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 RCA not injected.  - Continue torsemide 100 mg daily. - No bb w/ low output.  - No arb/dig/spiro with ESRD.  - Volume per HD. Diuretics per renal. - Hopefully EF improves with maintenance of NSR.  - Echo 1 month.   3. ESRD - Started HD in April and has been tolerating without difficulty. - On torsemide per  Nephrology.    4. DMII - Hgb A1C 5.9.  - On glipizide.   5. PUD - EGD 2018 with duodenal ulcer.  - Continue PPI   6. HTN  - Better today.  - ? Consider Hydralazine/Imdur if BP increases.  Follow up in 1 month as scheduled with Dr. Haroldine Laws + echo.  Allena Katz, FNP-BC 02/13/21

## 2021-02-13 ENCOUNTER — Other Ambulatory Visit: Payer: Self-pay

## 2021-02-13 ENCOUNTER — Ambulatory Visit (HOSPITAL_COMMUNITY)
Admission: RE | Admit: 2021-02-13 | Discharge: 2021-02-13 | Disposition: A | Discharge status: Home / Self Care | Payer: Medicare HMO | Source: Ambulatory Visit | Attending: Family Medicine | Admitting: Family Medicine

## 2021-02-13 ENCOUNTER — Encounter (HOSPITAL_COMMUNITY): Payer: Self-pay

## 2021-02-13 VITALS — BP 122/60 | HR 70 | Wt 198.4 lb

## 2021-02-13 DIAGNOSIS — I2510 Atherosclerotic heart disease of native coronary artery without angina pectoris: Secondary | ICD-10-CM | POA: Insufficient documentation

## 2021-02-13 DIAGNOSIS — I5082 Biventricular heart failure: Secondary | ICD-10-CM | POA: Diagnosis not present

## 2021-02-13 DIAGNOSIS — Z992 Dependence on renal dialysis: Secondary | ICD-10-CM

## 2021-02-13 DIAGNOSIS — I5042 Chronic combined systolic (congestive) and diastolic (congestive) heart failure: Secondary | ICD-10-CM | POA: Diagnosis not present

## 2021-02-13 DIAGNOSIS — K269 Duodenal ulcer, unspecified as acute or chronic, without hemorrhage or perforation: Secondary | ICD-10-CM | POA: Diagnosis not present

## 2021-02-13 DIAGNOSIS — Z09 Encounter for follow-up examination after completed treatment for conditions other than malignant neoplasm: Secondary | ICD-10-CM | POA: Diagnosis not present

## 2021-02-13 DIAGNOSIS — K279 Peptic ulcer, site unspecified, unspecified as acute or chronic, without hemorrhage or perforation: Secondary | ICD-10-CM | POA: Diagnosis not present

## 2021-02-13 DIAGNOSIS — I480 Paroxysmal atrial fibrillation: Secondary | ICD-10-CM | POA: Diagnosis not present

## 2021-02-13 DIAGNOSIS — I4892 Unspecified atrial flutter: Secondary | ICD-10-CM | POA: Diagnosis not present

## 2021-02-13 DIAGNOSIS — N186 End stage renal disease: Secondary | ICD-10-CM | POA: Diagnosis not present

## 2021-02-13 DIAGNOSIS — I132 Hypertensive heart and chronic kidney disease with heart failure and with stage 5 chronic kidney disease, or end stage renal disease: Secondary | ICD-10-CM | POA: Insufficient documentation

## 2021-02-13 DIAGNOSIS — E1122 Type 2 diabetes mellitus with diabetic chronic kidney disease: Secondary | ICD-10-CM

## 2021-02-13 DIAGNOSIS — I5022 Chronic systolic (congestive) heart failure: Secondary | ICD-10-CM | POA: Diagnosis not present

## 2021-02-13 DIAGNOSIS — Z7984 Long term (current) use of oral hypoglycemic drugs: Secondary | ICD-10-CM | POA: Insufficient documentation

## 2021-02-13 DIAGNOSIS — Z87891 Personal history of nicotine dependence: Secondary | ICD-10-CM | POA: Diagnosis not present

## 2021-02-13 DIAGNOSIS — Z794 Long term (current) use of insulin: Secondary | ICD-10-CM

## 2021-02-13 DIAGNOSIS — I10 Essential (primary) hypertension: Secondary | ICD-10-CM

## 2021-02-13 DIAGNOSIS — Z7901 Long term (current) use of anticoagulants: Secondary | ICD-10-CM | POA: Insufficient documentation

## 2021-02-13 DIAGNOSIS — Z8249 Family history of ischemic heart disease and other diseases of the circulatory system: Secondary | ICD-10-CM | POA: Diagnosis not present

## 2021-02-13 DIAGNOSIS — Z79899 Other long term (current) drug therapy: Secondary | ICD-10-CM | POA: Diagnosis not present

## 2021-02-13 LAB — COMPREHENSIVE METABOLIC PANEL
ALT: 123 U/L — ABNORMAL HIGH (ref 0–44)
AST: 57 U/L — ABNORMAL HIGH (ref 15–41)
Albumin: 3 g/dL — ABNORMAL LOW (ref 3.5–5.0)
Alkaline Phosphatase: 52 U/L (ref 38–126)
Anion gap: 7 (ref 5–15)
BUN: 46 mg/dL — ABNORMAL HIGH (ref 8–23)
CO2: 30 mmol/L (ref 22–32)
Calcium: 8.7 mg/dL — ABNORMAL LOW (ref 8.9–10.3)
Chloride: 99 mmol/L (ref 98–111)
Creatinine, Ser: 5.34 mg/dL — ABNORMAL HIGH (ref 0.61–1.24)
GFR, Estimated: 10 mL/min — ABNORMAL LOW (ref >60)
Glucose, Bld: 85 mg/dL (ref 70–99)
Potassium: 4.9 mmol/L (ref 3.5–5.1)
Sodium: 136 mmol/L (ref 135–145)
Total Bilirubin: 0.6 mg/dL (ref 0.3–1.2)
Total Protein: 6.7 g/dL (ref 6.5–8.1)

## 2021-02-13 LAB — TSH: TSH: 0.986 u[IU]/mL (ref 0.350–4.500)

## 2021-02-13 NOTE — Patient Instructions (Addendum)
Labs done today. We will contact you only if your labs are abnormal.  No medication changes were made. Please continue all current medications as prescribed.  Your physician recommends that you keep your pending appointment scheduled on Wednesday August 17th 2022 echo at 2pm and appointment with Dr. Haroldine Laws at 3pm  If you have any questions or concerns before your next appointment please send [place] a message through [facility] or call our office at [phone].    TO LEAVE A MESSAGE FOR THE NURSE SELECT OPTION 2, PLEASE LEAVE A MESSAGE INCLUDING: YOUR NAME DATE OF BIRTH CALL BACK NUMBER REASON FOR CALL**this is important as we prioritize the call backs  YOU WILL RECEIVE A CALL BACK THE SAME DAY AS LONG AS YOU CALL BEFORE 4:00 PM   Do the following things EVERYDAY: Weigh yourself in the morning before breakfast. Write it down and keep it in a log. Take your medicines as prescribed Eat low salt foods--Limit salt (sodium) to 2000 mg per day.  Stay as active as you can everyday Limit all fluids for the day to less than 2 liters   At the [facility], you and your health needs are our priority. As part of our continuing mission to provide you with exceptional heart care, we have created designated Provider Care Teams. These Care Teams include your primary Cardiologist (physician) and Advanced Practice Providers (APPs- Physician Assistants and Nurse Practitioners) who all work together to provide you with the care you need, when you need it.   You may see any of the following providers on your designated Care Team at your next follow up: Dr Glori Bickers Dr Haynes Kerns, NP Lyda Jester, [place] Audry Riles, PharmD   Please be sure to bring in all your medications bottles to every appointment.

## 2021-02-14 DIAGNOSIS — N186 End stage renal disease: Secondary | ICD-10-CM | POA: Diagnosis not present

## 2021-02-14 DIAGNOSIS — D509 Iron deficiency anemia, unspecified: Secondary | ICD-10-CM | POA: Diagnosis not present

## 2021-02-14 DIAGNOSIS — E119 Type 2 diabetes mellitus without complications: Secondary | ICD-10-CM | POA: Diagnosis not present

## 2021-02-14 DIAGNOSIS — Z992 Dependence on renal dialysis: Secondary | ICD-10-CM | POA: Diagnosis not present

## 2021-02-14 DIAGNOSIS — E039 Hypothyroidism, unspecified: Secondary | ICD-10-CM | POA: Diagnosis not present

## 2021-02-14 DIAGNOSIS — N2581 Secondary hyperparathyroidism of renal origin: Secondary | ICD-10-CM | POA: Diagnosis not present

## 2021-02-14 DIAGNOSIS — D689 Coagulation defect, unspecified: Secondary | ICD-10-CM | POA: Diagnosis not present

## 2021-02-17 DIAGNOSIS — N2581 Secondary hyperparathyroidism of renal origin: Secondary | ICD-10-CM | POA: Diagnosis not present

## 2021-02-17 DIAGNOSIS — D689 Coagulation defect, unspecified: Secondary | ICD-10-CM | POA: Diagnosis not present

## 2021-02-17 DIAGNOSIS — D509 Iron deficiency anemia, unspecified: Secondary | ICD-10-CM | POA: Diagnosis not present

## 2021-02-17 DIAGNOSIS — Z992 Dependence on renal dialysis: Secondary | ICD-10-CM | POA: Diagnosis not present

## 2021-02-17 DIAGNOSIS — N186 End stage renal disease: Secondary | ICD-10-CM | POA: Diagnosis not present

## 2021-02-17 NOTE — Addendum Note (Signed)
Encounter addended by: Rafael Bihari, FNP on: 02/17/2021 2:05 AM  Actions taken: Clinical Note Signed

## 2021-02-18 DIAGNOSIS — Z992 Dependence on renal dialysis: Secondary | ICD-10-CM | POA: Diagnosis not present

## 2021-02-18 DIAGNOSIS — D689 Coagulation defect, unspecified: Secondary | ICD-10-CM | POA: Diagnosis not present

## 2021-02-18 DIAGNOSIS — N2581 Secondary hyperparathyroidism of renal origin: Secondary | ICD-10-CM | POA: Diagnosis not present

## 2021-02-18 DIAGNOSIS — D509 Iron deficiency anemia, unspecified: Secondary | ICD-10-CM | POA: Diagnosis not present

## 2021-02-18 DIAGNOSIS — N186 End stage renal disease: Secondary | ICD-10-CM | POA: Diagnosis not present

## 2021-02-19 DIAGNOSIS — D689 Coagulation defect, unspecified: Secondary | ICD-10-CM | POA: Diagnosis not present

## 2021-02-19 DIAGNOSIS — D509 Iron deficiency anemia, unspecified: Secondary | ICD-10-CM | POA: Diagnosis not present

## 2021-02-19 DIAGNOSIS — Z992 Dependence on renal dialysis: Secondary | ICD-10-CM | POA: Diagnosis not present

## 2021-02-19 DIAGNOSIS — N186 End stage renal disease: Secondary | ICD-10-CM | POA: Diagnosis not present

## 2021-02-19 DIAGNOSIS — N2581 Secondary hyperparathyroidism of renal origin: Secondary | ICD-10-CM | POA: Diagnosis not present

## 2021-02-20 DIAGNOSIS — I872 Venous insufficiency (chronic) (peripheral): Secondary | ICD-10-CM | POA: Diagnosis not present

## 2021-02-20 DIAGNOSIS — B351 Tinea unguium: Secondary | ICD-10-CM | POA: Diagnosis not present

## 2021-02-20 DIAGNOSIS — E1151 Type 2 diabetes mellitus with diabetic peripheral angiopathy without gangrene: Secondary | ICD-10-CM | POA: Diagnosis not present

## 2021-02-22 DIAGNOSIS — N179 Acute kidney failure, unspecified: Secondary | ICD-10-CM | POA: Diagnosis not present

## 2021-02-22 DIAGNOSIS — Z992 Dependence on renal dialysis: Secondary | ICD-10-CM | POA: Diagnosis not present

## 2021-02-22 DIAGNOSIS — N186 End stage renal disease: Secondary | ICD-10-CM | POA: Diagnosis not present

## 2021-02-24 DIAGNOSIS — N186 End stage renal disease: Secondary | ICD-10-CM | POA: Diagnosis not present

## 2021-02-24 DIAGNOSIS — N2581 Secondary hyperparathyroidism of renal origin: Secondary | ICD-10-CM | POA: Diagnosis not present

## 2021-02-24 DIAGNOSIS — Z992 Dependence on renal dialysis: Secondary | ICD-10-CM | POA: Diagnosis not present

## 2021-02-24 DIAGNOSIS — D689 Coagulation defect, unspecified: Secondary | ICD-10-CM | POA: Diagnosis not present

## 2021-02-24 DIAGNOSIS — D631 Anemia in chronic kidney disease: Secondary | ICD-10-CM | POA: Diagnosis not present

## 2021-02-24 DIAGNOSIS — D509 Iron deficiency anemia, unspecified: Secondary | ICD-10-CM | POA: Diagnosis not present

## 2021-02-25 ENCOUNTER — Ambulatory Visit (INDEPENDENT_AMBULATORY_CARE_PROVIDER_SITE_OTHER): Payer: Medicare HMO | Admitting: Vascular Surgery

## 2021-02-25 ENCOUNTER — Other Ambulatory Visit: Payer: Self-pay

## 2021-02-25 ENCOUNTER — Encounter: Payer: Self-pay | Admitting: Vascular Surgery

## 2021-02-25 VITALS — BP 99/59 | HR 83 | Temp 98.2°F | Ht 66.5 in | Wt 199.6 lb

## 2021-02-25 DIAGNOSIS — N186 End stage renal disease: Secondary | ICD-10-CM

## 2021-02-25 NOTE — Progress Notes (Signed)
   Vascular and Vein Specialist of [place]  Patient name: Kyle Hicks MRN: [id number] DOB: [birth year] Sex: male  REASON FOR VISIT: Follow-up left upper arm AV Gore-Tex graft placement on 01/22/2021 at [facility]  Nephrologist: Marval Regal  HPI: Kyle Hicks is a 80 y.o. male here today for follow-up.  He currently is dialyzed via his right IJ catheter.  He had placement of new left upper arm AV Gore-Tex graft on 01/22/2021 at [facility].  He has had no difficulty with this.  He denies any steal symptoms.  Current Outpatient Medications  Medication Sig Dispense Refill   amiodarone (PACERONE) 100 MG tablet Take 1 tablet (100 mg total) by mouth daily. 30 tablet 6   apixaban (ELIQUIS) 5 MG TABS tablet Take 1 tablet (5 mg total) by mouth 2 (two) times daily. 60 tablet 3   atorvastatin (LIPITOR) 20 MG tablet Take 1 tablet (20 mg total) by mouth daily. 30 tablet 3   blood glucose meter kit and supplies KIT Dispense based on patient and insurance preference. Use up to 2 times daily as directed. (FOR ICD-10 E11.65) 1 each 5   glipiZIDE (GLUCOTROL) 5 MG tablet Take 1 tablet by mouth once daily with breakfast 30 tablet 0   glucose blood (ACCU-CHEK GUIDE) test strip Use as instructed bid E11.65 100 each 3   hydrocortisone cream 1 % Apply 1 application topically 3 (three) times daily as needed for itching (minor skin irritation). 30 g 0   Lancets (ACCU-CHEK MULTICLIX) lancets Use as instructed bid. E11.65 100 each 3   [name] CARBONATE PO 500 mg. Patient is taking 500 mg tablet (chewable)3 times daily.     oxyCODONE-acetaminophen (PERCOCET) 5-325 MG tablet Take 1 tablet by mouth every 6 (six) hours as needed for severe pain. 8 tablet 0   Polyethyl Glycol-Propyl Glycol (SYSTANE) 0.4-0.3 % SOLN Place 1 drop into both eyes daily as needed (Eye irritation).     torsemide (DEMADEX) 100 MG tablet Take 1 tablet (100 mg total) by mouth daily. 30 tablet 3    No current facility-administered medications for this visit.     PHYSICAL EXAM: Vitals:   02/25/21 1022  BP: (!) 99/59  Pulse: 83  Temp: 98.2 F (36.8 C)  TempSrc: Temporal  SpO2: 99%  Weight: 199 lb 9.6 oz (90.5 kg)  Height: 5' 6.5" (1.689 m)    GENERAL: The patient is a well-nourished male, in no acute distress. The vital signs are documented above. 2+ radial pulses bilaterally. Well-healed incision at the antecubital space and axilla.  Excellent thrill in his AV graft  MEDICAL ISSUES: Stable 5 weeks out from left upper arm AV graft placement.  He is available to use this at any time.  I will communicate with the hemodialysis center.  I explained to the patient that he has transition from catheter to graft utilization.  He will see [place] again as needed   [name], MD [facility] Vascular and Vein Specialists of [facility] Tel [phone]  Note: Portions of this report may have been transcribed using voice recognition software.  Every effort has been made to ensure accuracy; however, inadvertent computerized transcription errors may still be present.

## 2021-02-26 DIAGNOSIS — Z992 Dependence on renal dialysis: Secondary | ICD-10-CM | POA: Diagnosis not present

## 2021-02-26 DIAGNOSIS — N2581 Secondary hyperparathyroidism of renal origin: Secondary | ICD-10-CM | POA: Diagnosis not present

## 2021-02-26 DIAGNOSIS — D509 Iron deficiency anemia, unspecified: Secondary | ICD-10-CM | POA: Diagnosis not present

## 2021-02-26 DIAGNOSIS — D689 Coagulation defect, unspecified: Secondary | ICD-10-CM | POA: Diagnosis not present

## 2021-02-26 DIAGNOSIS — D631 Anemia in chronic kidney disease: Secondary | ICD-10-CM | POA: Diagnosis not present

## 2021-02-26 DIAGNOSIS — N186 End stage renal disease: Secondary | ICD-10-CM | POA: Diagnosis not present

## 2021-02-28 ENCOUNTER — Other Ambulatory Visit: Payer: Self-pay | Admitting: Nurse Practitioner

## 2021-02-28 DIAGNOSIS — D509 Iron deficiency anemia, unspecified: Secondary | ICD-10-CM | POA: Diagnosis not present

## 2021-02-28 DIAGNOSIS — Z992 Dependence on renal dialysis: Secondary | ICD-10-CM | POA: Diagnosis not present

## 2021-02-28 DIAGNOSIS — D631 Anemia in chronic kidney disease: Secondary | ICD-10-CM | POA: Diagnosis not present

## 2021-02-28 DIAGNOSIS — N2581 Secondary hyperparathyroidism of renal origin: Secondary | ICD-10-CM | POA: Diagnosis not present

## 2021-02-28 DIAGNOSIS — N186 End stage renal disease: Secondary | ICD-10-CM | POA: Diagnosis not present

## 2021-02-28 DIAGNOSIS — D689 Coagulation defect, unspecified: Secondary | ICD-10-CM | POA: Diagnosis not present

## 2021-03-02 ENCOUNTER — Other Ambulatory Visit (HOSPITAL_COMMUNITY): Payer: Self-pay | Admitting: Cardiology

## 2021-03-02 DIAGNOSIS — I5022 Chronic systolic (congestive) heart failure: Secondary | ICD-10-CM

## 2021-03-03 ENCOUNTER — Other Ambulatory Visit: Payer: Self-pay | Admitting: *Deleted

## 2021-03-03 DIAGNOSIS — N186 End stage renal disease: Secondary | ICD-10-CM | POA: Diagnosis not present

## 2021-03-03 DIAGNOSIS — N2581 Secondary hyperparathyroidism of renal origin: Secondary | ICD-10-CM | POA: Diagnosis not present

## 2021-03-03 DIAGNOSIS — Z992 Dependence on renal dialysis: Secondary | ICD-10-CM | POA: Diagnosis not present

## 2021-03-03 DIAGNOSIS — D689 Coagulation defect, unspecified: Secondary | ICD-10-CM | POA: Diagnosis not present

## 2021-03-03 DIAGNOSIS — D509 Iron deficiency anemia, unspecified: Secondary | ICD-10-CM | POA: Diagnosis not present

## 2021-03-03 NOTE — Patient Outreach (Signed)
[facility]) Care Management  03/03/2021  Kyle Hicks 1940-09-21 [id number]   Outgoing call placed to member/wife, spoke to wife. State member is "good."  Was seen by vascular on 8/3, HD access now ready for use.  No complications of HD treatments.  Denies any urgent concerns, encouraged to contact this care manager with questions.  Agrees to follow up within the next month.   Goals Addressed             This Visit's Progress    THN - Find Help in My Community   Not on track    Timeframe:  Short-Term Goal Priority:  Medium Start Date:       7/12                      Expected End Date:     8/12  Barriers: Financial                   Follow Up Date 03/03/2021    - follow-up on any referrals for help I am given    Why is this important?   Knowing how and where to find help for yourself or family in your neighborhood and community is an important skill.  You will want to take some steps to learn how.    Notes:   7/12 - Referral placed to Pharmacy team ([facility]) as wife report [name] is costing $248 to fill  8/9 - Wife report no follow up call from [facility], had to purchase last month's supply of [name] out of pocket.  Call placed to [facility], spoke with Kyle Hicks, she will call member/wife directly.       COMPLETED: [facility] - Track and Manage Fluids and Swelling-Heart Failure   On track    Timeframe:  Short-Term Goal Priority:  High Start Date:       4/13                      Expected End Date: 5/13                      Barriers: Knowledge    - keep legs up while sitting - track weight in diary - weigh myself daily    Why is this important?   It is important to check your weight daily and watch how much salt and liquids you have.  It will help you to manage your heart failure.    Notes:   5/2 - Weight today was 187.2, yesterday was 187.4 pounds.  Educated on when to contact provider  6/2 - Per wife, weight has remained down, no gain, no  shortness of breath or swelling.  7/12 - Wife report member continues to be active with HD, managing fluid levels  8/9 - Member's weight's have remained stable, range 185-190 pounds, denies swelling or chest discomfort.     THN - Track and Manage Symptoms-Heart Failure   On track    Timeframe:  Long-Range Goal Priority:  High Start Date:         4/13                    Expected End Date:    10/13                  Barriers: Knowledge   - begin a heart failure diary - develop a rescue plan - know when to call the  doctor    Why is this important?   You will be able to handle your symptoms better if you keep track of them.  Making some simple changes to your lifestyle will help.  Eating healthy is one thing you can do to take good care of yourself.    Notes:   5/2 - Discussed follow up appointment with cardiology on 5/16.  Advised that [place] transportation team will contact them for details on arrangements.  Advised to take logs to appointment  6/2 - Was seen by cardiology last week, wife report appointment went well, remains in NSR, denies chest pain or discomfort  7/12 -Has follow up appointment on 7/22 with cardiology and on 8/3 with vascular.  Wife will continue to encouraged daily weights, blood pressure, and HR checks  8/9 - Will follow up with HF clinic on 8/17 for echo and provider appointment.         Kyle Hicks, [place], MSN [place] [phone]

## 2021-03-04 ENCOUNTER — Other Ambulatory Visit: Payer: Self-pay

## 2021-03-04 DIAGNOSIS — I509 Heart failure, unspecified: Secondary | ICD-10-CM | POA: Diagnosis not present

## 2021-03-05 ENCOUNTER — Telehealth: Payer: Self-pay

## 2021-03-05 ENCOUNTER — Other Ambulatory Visit: Payer: Self-pay | Admitting: "Endocrinology

## 2021-03-05 ENCOUNTER — Other Ambulatory Visit (HOSPITAL_COMMUNITY): Payer: Self-pay

## 2021-03-05 DIAGNOSIS — N186 End stage renal disease: Secondary | ICD-10-CM | POA: Diagnosis not present

## 2021-03-05 DIAGNOSIS — Z992 Dependence on renal dialysis: Secondary | ICD-10-CM | POA: Diagnosis not present

## 2021-03-05 DIAGNOSIS — D509 Iron deficiency anemia, unspecified: Secondary | ICD-10-CM | POA: Diagnosis not present

## 2021-03-05 DIAGNOSIS — N2581 Secondary hyperparathyroidism of renal origin: Secondary | ICD-10-CM | POA: Diagnosis not present

## 2021-03-05 DIAGNOSIS — D689 Coagulation defect, unspecified: Secondary | ICD-10-CM | POA: Diagnosis not present

## 2021-03-05 LAB — BASIC METABOLIC PANEL
BUN/Creatinine Ratio: 7 — ABNORMAL LOW (ref 10–24)
BUN: 39 mg/dL — ABNORMAL HIGH (ref 8–27)
CO2: 26 mmol/L (ref 20–29)
Calcium: 9.7 mg/dL (ref 8.6–10.2)
Chloride: 95 mmol/L — ABNORMAL LOW (ref 96–106)
Creatinine, Ser: 5.37 mg/dL — ABNORMAL HIGH (ref 0.76–1.27)
Glucose: 77 mg/dL (ref 65–99)
Potassium: 4.8 mmol/L (ref 3.5–5.2)
Sodium: 139 mmol/L (ref 134–144)
eGFR: 10 mL/min/{1.73_m2} — ABNORMAL LOW (ref >59)

## 2021-03-05 LAB — SPECIMEN STATUS REPORT

## 2021-03-05 MED ORDER — APIXABAN 5 MG PO TABS
5.0000 mg | ORAL_TABLET | Freq: Two times a day (BID) | ORAL | 11 refills | Status: DC
Start: 2021-03-05 — End: 2022-03-17

## 2021-03-05 MED ORDER — ATORVASTATIN CALCIUM 20 MG PO TABS: 20.0000 mg | ORAL_TABLET | Freq: Every day | ORAL | 11 refills | Status: DC

## 2021-03-05 MED ORDER — TORSEMIDE 100 MG PO TABS: 100.0000 mg | ORAL_TABLET | Freq: Every day | ORAL | 11 refills | Status: DC

## 2021-03-05 MED ORDER — GLIPIZIDE 5 MG PO TABS: 5.0000 mg | ORAL_TABLET | Freq: Every day | ORAL | 0 refills | Status: DC

## 2021-03-05 NOTE — Telephone Encounter (Signed)
Pt's wife is requesting a refill on glipizide. Last office visit was September of 2021. She states he is not checking his sugar. I advised her he needs to be checking sugar and provide atleast 2 weeks worth of readings for appt she made for 8/26

## 2021-03-05 NOTE — Telephone Encounter (Signed)
Pt's wife made aware. 

## 2021-03-07 DIAGNOSIS — D689 Coagulation defect, unspecified: Secondary | ICD-10-CM | POA: Diagnosis not present

## 2021-03-07 DIAGNOSIS — D509 Iron deficiency anemia, unspecified: Secondary | ICD-10-CM | POA: Diagnosis not present

## 2021-03-07 DIAGNOSIS — Z992 Dependence on renal dialysis: Secondary | ICD-10-CM | POA: Diagnosis not present

## 2021-03-07 DIAGNOSIS — N186 End stage renal disease: Secondary | ICD-10-CM | POA: Diagnosis not present

## 2021-03-07 DIAGNOSIS — N2581 Secondary hyperparathyroidism of renal origin: Secondary | ICD-10-CM | POA: Diagnosis not present

## 2021-03-10 DIAGNOSIS — D509 Iron deficiency anemia, unspecified: Secondary | ICD-10-CM | POA: Diagnosis not present

## 2021-03-10 DIAGNOSIS — Z992 Dependence on renal dialysis: Secondary | ICD-10-CM | POA: Diagnosis not present

## 2021-03-10 DIAGNOSIS — N2581 Secondary hyperparathyroidism of renal origin: Secondary | ICD-10-CM | POA: Diagnosis not present

## 2021-03-10 DIAGNOSIS — Z23 Encounter for immunization: Secondary | ICD-10-CM | POA: Diagnosis not present

## 2021-03-10 DIAGNOSIS — N186 End stage renal disease: Secondary | ICD-10-CM | POA: Diagnosis not present

## 2021-03-10 DIAGNOSIS — D689 Coagulation defect, unspecified: Secondary | ICD-10-CM | POA: Diagnosis not present

## 2021-03-10 DIAGNOSIS — D631 Anemia in chronic kidney disease: Secondary | ICD-10-CM | POA: Diagnosis not present

## 2021-03-10 DIAGNOSIS — E119 Type 2 diabetes mellitus without complications: Secondary | ICD-10-CM | POA: Diagnosis not present

## 2021-03-11 ENCOUNTER — Encounter (HOSPITAL_COMMUNITY): Payer: Medicare HMO | Admitting: Internal Medicine

## 2021-03-11 ENCOUNTER — Other Ambulatory Visit: Payer: Self-pay

## 2021-03-11 ENCOUNTER — Ambulatory Visit (HOSPITAL_COMMUNITY)
Admission: RE | Admit: 2021-03-11 | Discharge: 2021-03-11 | Disposition: A | Discharge status: Home / Self Care | Payer: Medicare HMO | Source: Ambulatory Visit | Attending: Family Medicine | Admitting: Family Medicine

## 2021-03-11 DIAGNOSIS — I351 Nonrheumatic aortic (valve) insufficiency: Secondary | ICD-10-CM | POA: Diagnosis not present

## 2021-03-11 DIAGNOSIS — I130 Hypertensive heart and chronic kidney disease with heart failure and stage 1 through stage 4 chronic kidney disease, or unspecified chronic kidney disease: Secondary | ICD-10-CM | POA: Insufficient documentation

## 2021-03-11 DIAGNOSIS — I5042 Chronic combined systolic (congestive) and diastolic (congestive) heart failure: Secondary | ICD-10-CM | POA: Diagnosis not present

## 2021-03-11 DIAGNOSIS — I4891 Unspecified atrial fibrillation: Secondary | ICD-10-CM | POA: Insufficient documentation

## 2021-03-11 DIAGNOSIS — E785 Hyperlipidemia, unspecified: Secondary | ICD-10-CM | POA: Diagnosis not present

## 2021-03-11 DIAGNOSIS — E1122 Type 2 diabetes mellitus with diabetic chronic kidney disease: Secondary | ICD-10-CM | POA: Diagnosis not present

## 2021-03-11 LAB — ECHOCARDIOGRAM COMPLETE
Calc EF: 44 %
S' Lateral: 3.2 cm
Single Plane A2C EF: 44.3 %
Single Plane A4C EF: 43.3 %

## 2021-03-11 NOTE — Progress Notes (Signed)
  Echocardiogram 2D Echocardiogram has been performed.  Johny Chess 03/11/2021, 2:47 PM

## 2021-03-12 DIAGNOSIS — N186 End stage renal disease: Secondary | ICD-10-CM | POA: Diagnosis not present

## 2021-03-12 DIAGNOSIS — D509 Iron deficiency anemia, unspecified: Secondary | ICD-10-CM | POA: Diagnosis not present

## 2021-03-12 DIAGNOSIS — E119 Type 2 diabetes mellitus without complications: Secondary | ICD-10-CM | POA: Diagnosis not present

## 2021-03-12 DIAGNOSIS — Z23 Encounter for immunization: Secondary | ICD-10-CM | POA: Diagnosis not present

## 2021-03-12 DIAGNOSIS — D631 Anemia in chronic kidney disease: Secondary | ICD-10-CM | POA: Diagnosis not present

## 2021-03-12 DIAGNOSIS — N2581 Secondary hyperparathyroidism of renal origin: Secondary | ICD-10-CM | POA: Diagnosis not present

## 2021-03-12 DIAGNOSIS — Z992 Dependence on renal dialysis: Secondary | ICD-10-CM | POA: Diagnosis not present

## 2021-03-12 DIAGNOSIS — D689 Coagulation defect, unspecified: Secondary | ICD-10-CM | POA: Diagnosis not present

## 2021-03-13 DIAGNOSIS — Z992 Dependence on renal dialysis: Secondary | ICD-10-CM | POA: Diagnosis not present

## 2021-03-13 DIAGNOSIS — Z452 Encounter for adjustment and management of vascular access device: Secondary | ICD-10-CM | POA: Diagnosis not present

## 2021-03-13 DIAGNOSIS — N186 End stage renal disease: Secondary | ICD-10-CM | POA: Diagnosis not present

## 2021-03-14 DIAGNOSIS — D689 Coagulation defect, unspecified: Secondary | ICD-10-CM | POA: Diagnosis not present

## 2021-03-14 DIAGNOSIS — N186 End stage renal disease: Secondary | ICD-10-CM | POA: Diagnosis not present

## 2021-03-14 DIAGNOSIS — Z23 Encounter for immunization: Secondary | ICD-10-CM | POA: Diagnosis not present

## 2021-03-14 DIAGNOSIS — N2581 Secondary hyperparathyroidism of renal origin: Secondary | ICD-10-CM | POA: Diagnosis not present

## 2021-03-14 DIAGNOSIS — D509 Iron deficiency anemia, unspecified: Secondary | ICD-10-CM | POA: Diagnosis not present

## 2021-03-14 DIAGNOSIS — D631 Anemia in chronic kidney disease: Secondary | ICD-10-CM | POA: Diagnosis not present

## 2021-03-14 DIAGNOSIS — Z992 Dependence on renal dialysis: Secondary | ICD-10-CM | POA: Diagnosis not present

## 2021-03-14 DIAGNOSIS — E119 Type 2 diabetes mellitus without complications: Secondary | ICD-10-CM | POA: Diagnosis not present

## 2021-03-17 DIAGNOSIS — D689 Coagulation defect, unspecified: Secondary | ICD-10-CM | POA: Diagnosis not present

## 2021-03-17 DIAGNOSIS — Z992 Dependence on renal dialysis: Secondary | ICD-10-CM | POA: Diagnosis not present

## 2021-03-17 DIAGNOSIS — N186 End stage renal disease: Secondary | ICD-10-CM | POA: Diagnosis not present

## 2021-03-17 DIAGNOSIS — D509 Iron deficiency anemia, unspecified: Secondary | ICD-10-CM | POA: Diagnosis not present

## 2021-03-17 DIAGNOSIS — D631 Anemia in chronic kidney disease: Secondary | ICD-10-CM | POA: Diagnosis not present

## 2021-03-17 DIAGNOSIS — N2581 Secondary hyperparathyroidism of renal origin: Secondary | ICD-10-CM | POA: Diagnosis not present

## 2021-03-19 DIAGNOSIS — N2581 Secondary hyperparathyroidism of renal origin: Secondary | ICD-10-CM | POA: Diagnosis not present

## 2021-03-19 DIAGNOSIS — N186 End stage renal disease: Secondary | ICD-10-CM | POA: Diagnosis not present

## 2021-03-19 DIAGNOSIS — D509 Iron deficiency anemia, unspecified: Secondary | ICD-10-CM | POA: Diagnosis not present

## 2021-03-19 DIAGNOSIS — D631 Anemia in chronic kidney disease: Secondary | ICD-10-CM | POA: Diagnosis not present

## 2021-03-19 DIAGNOSIS — Z992 Dependence on renal dialysis: Secondary | ICD-10-CM | POA: Diagnosis not present

## 2021-03-19 DIAGNOSIS — D689 Coagulation defect, unspecified: Secondary | ICD-10-CM | POA: Diagnosis not present

## 2021-03-19 LAB — HEPATIC FUNCTION PANEL
ALT: 32 IU/L (ref 0–44)
AST: 20 IU/L (ref 0–40)
Albumin: 4 g/dL (ref 3.7–4.7)
Alkaline Phosphatase: 80 IU/L (ref 44–121)
Bilirubin Total: 0.3 mg/dL (ref 0.0–1.2)
Bilirubin, Direct: 0.12 mg/dL (ref 0.00–0.40)
Total Protein: 7.1 g/dL (ref 6.0–8.5)

## 2021-03-19 LAB — SPECIMEN STATUS REPORT

## 2021-03-20 ENCOUNTER — Other Ambulatory Visit: Payer: Self-pay

## 2021-03-20 ENCOUNTER — Encounter: Payer: Self-pay | Admitting: "Endocrinology

## 2021-03-20 ENCOUNTER — Ambulatory Visit (INDEPENDENT_AMBULATORY_CARE_PROVIDER_SITE_OTHER): Payer: Medicare HMO | Admitting: "Endocrinology

## 2021-03-20 VITALS — BP 128/64 | HR 92 | Ht 66.5 in | Wt 193.0 lb

## 2021-03-20 DIAGNOSIS — E1122 Type 2 diabetes mellitus with diabetic chronic kidney disease: Secondary | ICD-10-CM | POA: Diagnosis not present

## 2021-03-20 DIAGNOSIS — N186 End stage renal disease: Secondary | ICD-10-CM | POA: Diagnosis not present

## 2021-03-20 DIAGNOSIS — E782 Mixed hyperlipidemia: Secondary | ICD-10-CM

## 2021-03-20 DIAGNOSIS — I10 Essential (primary) hypertension: Secondary | ICD-10-CM | POA: Diagnosis not present

## 2021-03-20 MED ORDER — ACCU-CHEK GUIDE VI STRP: ORAL_STRIP | 2 refills | Status: DC

## 2021-03-20 MED ORDER — ACCU-CHEK GUIDE ME W/DEVICE KIT: 1.0000 | PACK | 0 refills | Status: DC

## 2021-03-20 NOTE — Progress Notes (Signed)
03/20/2021  Endocrinology follow-up note   Subjective:    Patient ID: Kyle Hicks, male    DOB: Jan 25, 1941, PCP Sharilyn Sites, MD   Past Medical History:  Diagnosis Date   Chronic kidney disease    Diabetes mellitus without complication [facility])    Dialysis patient [facility])    Diastolic CHF, acute ([place]) 10/08/2020   HTN (hypertension)    Past Surgical History:  Procedure Laterality Date   AV FISTULA PLACEMENT Left 01/22/2021   Procedure: LEFT UPPER ARM ARTERIOVENOUS GORE-TEX GRAFT;  Surgeon: [name], MD;  Location: [facility] ORS;  Service: Vascular;  Laterality: Left;   CARDIOVERSION N/A 10/10/2020   Procedure: CARDIOVERSION;  Surgeon: Lelon Perla, MD;  Location: [facility] ENDOSCOPY;  Service: Cardiovascular;  Laterality: N/A;   ESOPHAGOGASTRODUODENOSCOPY N/A 04/27/2017   Procedure: ESOPHAGOGASTRODUODENOSCOPY (EGD);  Surgeon: Rogene Houston, MD;  Location: [facility] ENDO SUITE;  Service: Endoscopy;  Laterality: N/A;   IR FLUORO GUIDE CV LINE RIGHT  10/23/2020   IR US GUIDANCE  10/23/2020   RIGHT HEART CATH N/A 10/16/2020   Procedure: RIGHT HEART CATH;  Surgeon: Jolaine Artist, MD;  Location: [place] CV LAB;  Service: Cardiovascular;  Laterality: N/A;   RIGHT HEART CATH AND CORONARY ANGIOGRAPHY N/A 10/24/2020   Procedure: RIGHT HEART CATH AND CORONARY ANGIOGRAPHY;  Surgeon: Jolaine Artist, MD;  Location: [place] CV LAB;  Service: Cardiovascular;  Laterality: N/A;   TEE WITHOUT CARDIOVERSION N/A 10/10/2020   Procedure: TRANSESOPHAGEAL ECHOCARDIOGRAM (TEE);  Surgeon: Lelon Perla, MD;  Location: [facility] ENDOSCOPY;  Service: Cardiovascular;  Laterality: N/A;   TEMPORARY DIALYSIS CATHETER  10/16/2020   Procedure: TEMPORARY DIALYSIS CATHETER;  Surgeon: Jolaine Artist, MD;  Location: [place] CV LAB;  Service: Cardiovascular;;   Social History   Socioeconomic History   Marital status: Married    Spouse name: Not on file   Number of children: Not on file   Years of education:  Not on file   Highest education level: Not on file  Occupational History   Not on file  Tobacco Use   Smoking status: Former    Packs/day: 0.50    Types: Cigarettes    Quit date: 07/26/1987    Years since quitting: 33.6   Smokeless tobacco: Never  Vaping Use   Vaping Use: Never used  Substance and Sexual Activity   Alcohol use: No    Alcohol/week: 0.0 standard drinks    Comment: Previously drank socially, last drank 25-30 years ago   Drug use: No   Sexual activity: Not on file  Other Topics Concern   Not on file  Social History Narrative   Not on file   Social Determinants of Health   Financial Resource Strain: Not on file  Food Insecurity: No Food Insecurity   Worried About Running Out of Food in the Last Year: Never true   [place] in the Last Year: Never true  Transportation Needs: No Transportation Needs   Lack of Transportation (Medical): No   Lack of Transportation (Non-Medical): No  Physical Activity: Not on file  Stress: Not on file  Social Connections: Not on file   Outpatient Encounter Medications as of 03/20/2021  Medication Sig   Blood Glucose Monitoring Suppl (ACCU-CHEK GUIDE ME) w/Device KIT 1 Piece by Does not apply route as directed.   glucose blood (ACCU-CHEK GUIDE) test strip Use to test glucose 2 times a day   amiodarone (PACERONE) 100 MG tablet Take 1 tablet (100  mg total) by mouth daily.   apixaban (ELIQUIS) 5 MG TABS tablet Take 1 tablet (5 mg total) by mouth 2 (two) times daily.   atorvastatin (LIPITOR) 20 MG tablet Take 1 tablet (20 mg total) by mouth daily.   blood glucose meter kit and supplies KIT Dispense based on patient and insurance preference. Use up to 2 times daily as directed. (FOR ICD-10 E11.65)   glipiZIDE (GLUCOTROL) 5 MG tablet Take 1 tablet (5 mg total) by mouth daily with breakfast.   hydrocortisone cream 1 % Apply 1 application topically 3 (three) times daily as needed for itching (minor skin irritation).   Lancets  (ACCU-CHEK MULTICLIX) lancets Use as instructed bid. E11.65   [name] CARBONATE PO 500 mg. Patient is taking 500 mg tablet (chewable)3 times daily.   oxyCODONE-acetaminophen (PERCOCET) 5-325 MG tablet Take 1 tablet by mouth every 6 (six) hours as needed for severe pain.   Polyethyl Glycol-Propyl Glycol (SYSTANE) 0.4-0.3 % SOLN Place 1 drop into both eyes daily as needed (Eye irritation).   torsemide (DEMADEX) 100 MG tablet Take 1 tablet (100 mg total) by mouth daily.   [DISCONTINUED] glucose blood (ACCU-CHEK GUIDE) test strip Use as instructed bid E11.65   No facility-administered encounter medications on file as of 03/20/2021.   ALLERGIES: No Known Allergies VACCINATION STATUS: Immunization History  Administered Date(s) Administered   Moderna Sars-Covid-2 Vaccination 09/23/2019, 10/27/2019    Diabetes He presents for his follow-up diabetic visit. He has type 2 diabetes mellitus. The initial diagnosis of diabetes was made 2 years ago. His disease course has been improving. There are no hypoglycemic associated symptoms. Pertinent negatives for hypoglycemia include no confusion, headaches, pallor or seizures. There are no diabetic associated symptoms. Pertinent negatives for diabetes include no chest pain, no fatigue, no polydipsia, no polyphagia, no polyuria and no weakness. There are no hypoglycemic complications. Symptoms are improving. Diabetic complications include nephropathy. Risk factors for coronary artery disease include diabetes mellitus, dyslipidemia, hypertension, male sex, obesity, sedentary lifestyle and tobacco exposure. He is compliant with treatment most of the time. His weight is decreasing steadily (He lost approximately 60 pounds since last visit.). He is following a generally unhealthy diet. He has had a previous visit with a dietitian. He never participates in exercise. His home blood glucose trend is decreasing steadily. His breakfast blood glucose range is generally 130-140  mg/dl. His overall blood glucose range is 130-140 mg/dl. (He did not return to follow-up since September 2021.  In the interim patient developed worsening renal function now on hemodialysis.  He presents with controlled fasting glycemic profile and A1c of 5.5% at point-of-care.  He did not document any hypoglycemia.   ) An ACE inhibitor/angiotensin II receptor blocker is being taken.  Hypertension This is a chronic problem. The current episode started more than 1 year ago. The problem is uncontrolled. Pertinent negatives include no chest pain, headaches, neck pain, palpitations or shortness of breath. Risk factors for coronary artery disease include diabetes mellitus, dyslipidemia, obesity, sedentary lifestyle and smoking/tobacco exposure. Past treatments include angiotensin blockers. Identifiable causes of hypertension include chronic renal disease.  Hyperlipidemia This is a chronic problem. The current episode started more than 1 year ago. Exacerbating diseases include chronic renal disease, diabetes and obesity. Pertinent negatives include no chest pain, myalgias or shortness of breath. Current antihyperlipidemic treatment includes statins. Risk factors for coronary artery disease include diabetes mellitus, dyslipidemia, hypertension, family history, male sex, obesity and a sedentary lifestyle.   Review of Systems  Constitutional:  Negative  for fatigue and unexpected weight change.  HENT:  Negative for dental problem, mouth sores and trouble swallowing.   Eyes:  Negative for visual disturbance.  Respiratory:  Negative for cough, choking, chest tightness, shortness of breath and wheezing.   Cardiovascular:  Negative for chest pain, palpitations and leg swelling.  Gastrointestinal:  Negative for abdominal distention, abdominal pain, constipation, diarrhea, nausea and vomiting.  Endocrine: Negative for polydipsia, polyphagia and polyuria.  Genitourinary:  Negative for dysuria, flank pain, hematuria  and urgency.  Musculoskeletal:  Negative for back pain, gait problem, myalgias and neck pain.  Skin:  Negative for pallor, rash and wound.  Neurological:  Negative for seizures, syncope, weakness, numbness and headaches.  Psychiatric/Behavioral: Negative.  Negative for confusion and dysphoric mood.    Objective:    BP 128/64   Pulse 92   Ht 5' 6.5" (1.689 m)   Wt 193 lb (87.5 kg)   BMI 30.68 kg/m   Wt Readings from Last 3 Encounters:  03/20/21 193 lb (87.5 kg)  02/25/21 199 lb 9.6 oz (90.5 kg)  02/13/21 198 lb 6.4 oz (90 kg)    Physical Exam Constitutional:      General: He is not in acute distress.    Appearance: He is well-developed.  HENT:     Head: Normocephalic and atraumatic.  Neck:     Thyroid: No thyromegaly.     Trachea: No tracheal deviation.  Cardiovascular:     Rate and Rhythm: Normal rate.     Pulses:          Dorsalis pedis pulses are 1+ on the right side and 1+ on the left side.       Posterior tibial pulses are 1+ on the right side and 1+ on the left side.     Heart sounds: S1 normal and S2 normal. No murmur heard.   No gallop.  Pulmonary:     Effort: Pulmonary effort is normal. No respiratory distress.     Breath sounds: No wheezing.  Abdominal:     General: There is no distension.     Tenderness: There is no abdominal tenderness. There is no guarding.  Musculoskeletal:     Right shoulder: No swelling or deformity.     Cervical back: Normal range of motion and neck supple.  Skin:    General: Skin is warm and dry.     Findings: No rash.     Nails: There is no clubbing.  Neurological:     Mental Status: He is alert and oriented to person, place, and time.     Cranial Nerves: No cranial nerve deficit.     Sensory: No sensory deficit.     Gait: Gait normal.  Psychiatric:        Speech: Speech normal.        Behavior: Behavior is cooperative.        Judgment: Judgment normal.    Results for orders placed or performed during the hospital encounter  of 03/11/21  ECHOCARDIOGRAM COMPLETE  Result Value Ref Range   S' Lateral 3.20 cm   Single Plane A4C EF 43.3 %   Single Plane A2C EF 44.3 %   Calc EF 44.0 %   Diabetic Labs (most recent): Lab Results  Component Value Date   HGBA1C 5.9 (H) 10/07/2020   HGBA1C 6.5 (A) 04/14/2020   HGBA1C 6.9 (A) 12/10/2019   Lipid Panel     Component Value Date/Time   CHOL 151 04/08/2020 0724   TRIG 77  04/08/2020 0724   HDL 49 04/08/2020 0724   CHOLHDL 3.1 04/08/2020 0724   VLDL 14 01/19/2016 0719   LDLCALC 85 04/08/2020 0724     Assessment & Plan:   1. 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 These are all discussed in detail with the patient.  He did not return to follow-up since September 2021.  In the interim patient developed worsening renal function now on hemodialysis.  He presents with controlled fasting glycemic profile and A1c of 5.5% at point-of-care.  He did not document any hypoglycemia.    -His  recent labs reviewed, showing stage 2-3 renal insufficiency.  - I have re-counseled the patient on diet management and weight loss  by adopting a carbohydrate restricted / protein rich  Diet.  - he acknowledges that there is a room for improvement in his food and drink choices. - Suggestion is made for him to avoid simple carbohydrates  from his diet including Cakes, Sweet Desserts, Ice Cream, Soda (diet and regular), Sweet Tea, Candies, Chips, Cookies, Store Bought Juices, Alcohol in Excess of  1-2 drinks a day, Artificial Sweeteners,  Coffee Creamer, and "Sugar-free" Products, Lemonade. This will help patient to have more stable blood glucose profile and potentially avoid unintended weight gain.   - Patient is advised to stick to a routine mealtimes to eat 3 meals  a day and avoid unnecessary snacks ( to snack only to correct  hypoglycemia).  - I have approached patient with the following individualized plan to manage diabetes and patient agrees.   -Emphasizing the need for monitoring blood glucose at least twice a day-daily before breakfast and at bedtime, he is advised to stay off of Janumet.  He will continue glipizide 5 mg XL p.o. daily at breakfast.   -He is encouraged to call clinic for blood glucose readings below 70 or greater than 200 mg/dl at fasting. - Patient specific target  for A1c; LDL, HDL, Triglycerides,  were discussed in detail.  2) BP/HTN: His blood pressure is controlled to target.  He is only on torsemide 100 mg p.o. daily.  He is advised to maintain close follow-up with his nephrologist.  3) Lipids/HPL: His recent lipid panel showed LDL 85.  He is advised to continue atorvastatin 20 mg p.o. nightly.     Side effects and precautions discussed with him.    4)  Weight/Diet: His BMI is 30.6 dropping from 43.3 during his last visit.  He has lost weight related to initiation of hemodialysis in the interval.     5) Chronic Care/Health Maintenance:  -Patient is on ACEI/ARB and Statin medications and encouraged to continue to follow up with Ophthalmology, nephrology, cardiology, podiatrist at least yearly or according to recommendations, and advised to stay away from smoking. I have recommended yearly flu vaccine and pneumonia vaccination at least every 5 years; moderate intensity exercise for up to 150 minutes weekly; and  sleep for at least 7 hours a day.  He is currently on amiodarone related to his recent diagnosis of tachyarrhythmia-atrial flutter/atrial fibrillation.  He will need thyroid function test at least twice a year.  Last TSH in July 2022 was 0.98.  - I advised patient to maintain close follow up with Sharilyn Sites, MD for primary care needs.   I spent 41 minutes in the care of the patient today including  review of labs from [place], Lipids, Thyroid Function, Hematology (current and  previous including abstractions from other facilities); face-to-face time discussing  his blood glucose readings/logs, discussing hypoglycemia and hyperglycemia episodes and symptoms, medications doses, his options of short and long term treatment based on the latest standards of care / guidelines;  discussion about incorporating lifestyle medicine;  and documenting the encounter.    Please refer to Patient Instructions for Blood Glucose Monitoring and Insulin/Medications Dosing Guide"  in media tab for additional information. Please  also refer to " Patient Self Inventory" in the Media  tab for reviewed elements of pertinent patient history.  Kyle Hicks participated in the discussions, expressed understanding, and voiced agreement with the above plans.  All questions were answered to his satisfaction. he is encouraged to contact clinic should he have any questions or concerns prior to his return visit.   Follow up plan: -Return in about 4 months (around 07/20/2021) for F/U with Pre-visit Labs, Meter, Logs, A1c here.Kyle Lloyd, MD Phone: [phone]  Fax: [phone]  -  This note was partially dictated with voice recognition software. Similar sounding words can be transcribed inadequately or may not  be corrected upon review.  03/20/2021, 12:03 PM

## 2021-03-20 NOTE — Patient Instructions (Signed)

## 2021-03-21 DIAGNOSIS — Z992 Dependence on renal dialysis: Secondary | ICD-10-CM | POA: Diagnosis not present

## 2021-03-21 DIAGNOSIS — N2581 Secondary hyperparathyroidism of renal origin: Secondary | ICD-10-CM | POA: Diagnosis not present

## 2021-03-21 DIAGNOSIS — D509 Iron deficiency anemia, unspecified: Secondary | ICD-10-CM | POA: Diagnosis not present

## 2021-03-21 DIAGNOSIS — D689 Coagulation defect, unspecified: Secondary | ICD-10-CM | POA: Diagnosis not present

## 2021-03-21 DIAGNOSIS — N186 End stage renal disease: Secondary | ICD-10-CM | POA: Diagnosis not present

## 2021-03-21 DIAGNOSIS — D631 Anemia in chronic kidney disease: Secondary | ICD-10-CM | POA: Diagnosis not present

## 2021-03-23 ENCOUNTER — Ambulatory Visit (HOSPITAL_COMMUNITY)
Admission: RE | Admit: 2021-03-23 | Discharge: 2021-03-23 | Disposition: A | Discharge status: Home / Self Care | Payer: Medicare HMO | Source: Ambulatory Visit | Attending: Internal Medicine | Admitting: Internal Medicine

## 2021-03-23 ENCOUNTER — Other Ambulatory Visit: Payer: Self-pay

## 2021-03-23 ENCOUNTER — Encounter (HOSPITAL_COMMUNITY): Payer: Self-pay | Admitting: Internal Medicine

## 2021-03-23 VITALS — BP 124/62 | HR 96 | Wt 195.0 lb

## 2021-03-23 DIAGNOSIS — I2510 Atherosclerotic heart disease of native coronary artery without angina pectoris: Secondary | ICD-10-CM | POA: Insufficient documentation

## 2021-03-23 DIAGNOSIS — Z8249 Family history of ischemic heart disease and other diseases of the circulatory system: Secondary | ICD-10-CM | POA: Diagnosis not present

## 2021-03-23 DIAGNOSIS — Z87891 Personal history of nicotine dependence: Secondary | ICD-10-CM | POA: Diagnosis not present

## 2021-03-23 DIAGNOSIS — I480 Paroxysmal atrial fibrillation: Secondary | ICD-10-CM | POA: Diagnosis not present

## 2021-03-23 DIAGNOSIS — I5022 Chronic systolic (congestive) heart failure: Secondary | ICD-10-CM

## 2021-03-23 DIAGNOSIS — Z7901 Long term (current) use of anticoagulants: Secondary | ICD-10-CM | POA: Insufficient documentation

## 2021-03-23 DIAGNOSIS — E1122 Type 2 diabetes mellitus with diabetic chronic kidney disease: Secondary | ICD-10-CM | POA: Diagnosis not present

## 2021-03-23 DIAGNOSIS — I132 Hypertensive heart and chronic kidney disease with heart failure and with stage 5 chronic kidney disease, or end stage renal disease: Secondary | ICD-10-CM | POA: Diagnosis not present

## 2021-03-23 DIAGNOSIS — Z79899 Other long term (current) drug therapy: Secondary | ICD-10-CM | POA: Insufficient documentation

## 2021-03-23 DIAGNOSIS — N186 End stage renal disease: Secondary | ICD-10-CM | POA: Diagnosis not present

## 2021-03-23 DIAGNOSIS — Z794 Long term (current) use of insulin: Secondary | ICD-10-CM | POA: Insufficient documentation

## 2021-03-23 DIAGNOSIS — Z992 Dependence on renal dialysis: Secondary | ICD-10-CM | POA: Diagnosis not present

## 2021-03-23 DIAGNOSIS — I491 Atrial premature depolarization: Secondary | ICD-10-CM | POA: Insufficient documentation

## 2021-03-23 DIAGNOSIS — I5082 Biventricular heart failure: Secondary | ICD-10-CM | POA: Diagnosis not present

## 2021-03-23 DIAGNOSIS — Z09 Encounter for follow-up examination after completed treatment for conditions other than malignant neoplasm: Secondary | ICD-10-CM | POA: Insufficient documentation

## 2021-03-23 DIAGNOSIS — I4892 Unspecified atrial flutter: Secondary | ICD-10-CM | POA: Insufficient documentation

## 2021-03-23 DIAGNOSIS — Z8711 Personal history of peptic ulcer disease: Secondary | ICD-10-CM | POA: Insufficient documentation

## 2021-03-23 MED ORDER — ACCU-CHEK GUIDE ME W/DEVICE KIT: 1.0000 | PACK | 0 refills | Status: DC

## 2021-03-23 MED ORDER — CARVEDILOL 3.125 MG PO TABS: 3.1250 mg | ORAL_TABLET | Freq: Two times a day (BID) | ORAL | 11 refills | Status: DC

## 2021-03-23 MED ORDER — ACCU-CHEK GUIDE VI STRP: ORAL_STRIP | 2 refills | Status: DC

## 2021-03-23 NOTE — Addendum Note (Signed)
Encounter addended by: Jolaine Artist, MD on: 03/23/2021 11:58 AM  Actions taken: Clinical Note Signed

## 2021-03-23 NOTE — Patient Instructions (Addendum)
EKG done today.  No Labs done today.  START Carvedilol 3.'125mg'$  (1 tablet) by mouth 2 times daily. DO NOT take on the mornings you have Dialysis.   No other medication changes were made. Please continue all current medications as prescribed.  Your physician recommends that you schedule a follow-up appointment in: 9 months. Please contact our office in April 2023 to schedule a May appointment.   If you have any questions or concerns before your next appointment please send [place] a message through [place] or call our office at [phone].    TO LEAVE A MESSAGE FOR THE NURSE SELECT OPTION 2, PLEASE LEAVE A MESSAGE INCLUDING: YOUR NAME DATE OF BIRTH CALL BACK NUMBER REASON FOR CALL**this is important as we prioritize the call backs  YOU WILL RECEIVE A CALL BACK THE SAME DAY AS LONG AS YOU CALL BEFORE 4:00 PM   Do the following things EVERYDAY: Weigh yourself in the morning before breakfast. Write it down and keep it in a log. Take your medicines as prescribed Eat low salt foods--Limit salt (sodium) to 2000 mg per day.  Stay as active as you can everyday Limit all fluids for the day to less than 2 liters   At the [facility], you and your health needs are our priority. As part of our continuing mission to provide you with exceptional heart care, we have created designated Provider Care Teams. These Care Teams include your primary Cardiologist (physician) and Advanced Practice Providers (APPs- Physician Assistants and Nurse Practitioners) who all work together to provide you with the care you need, when you need it.   You may see any of the following providers on your designated Care Team at your next follow up: Dr Glori Bickers Dr Haynes Kerns, NP Lyda Jester, [place] Audry Riles, PharmD   Please be sure to bring in all your medications bottles to every appointment.

## 2021-03-23 NOTE — Progress Notes (Addendum)
PCP: Dr Hilma Favors Primary HF Cardiologist: Dr Haroldine Laws  Nephrology: Dr Marval Regal  HPI: Mr Sweetland is a 80 year old with history of HTN, DMII, chronic biventricular heart failure, A flutter, AKI/ESRD, PUD, and GI bleed.    Had ECHO 2018 and EF was 55%.    Admitted to [facility] 10/07/20 with weakness and diarrhea. On arrival EKG showed A flutter RVR. Hospital course complicated by AKI and Acute Biventricular Heart Failure. Echo showed LVEF 10-15% and severely reduced RV.  Attempted to diurese with IV lasix and milrinone but ultimately developed AKI. Placed on HD and was able to tolerate. 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  He failed cardioversion but chemically converted with amiodarone. Nephrology recommended continuing torsemide 100 mg at discharge.   Today he returns for f/u. Feels good. Denies CP or SOB. Remains on HD T, TH, Sat via LUE fistula. Weight stable at 185. BP drops at HD on occasion and gets fluid back.   Echo 03/11/21 EF 40-45% Personally reviewed  Cardiac Testing  Echo 10/08/20 - EF 10-15% RV severely reduced. IVC dilated suggesting RA pressure 15. Echo 2018 EF 55%.    TEE 10/10/20  w/ severe Biv dysfunction, Mod-severe MR, severe TR. No LAA thrombus. DCCV unsuccessful   RHC 10/24/20 Ao = 89/52 (68) RA = 10 RV = 45/12 PA = 47/17 (28) PCW = 9 Fick cardiac output/index = 6.6/3.1 PVR =2.9 FA sat = 98% PA sat = 65%, 67%  Assessment: 1. 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 RCA not injected 2. Mild PAH 3. 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   ROS: All systems negative except as listed in HPI, PMH and Problem List.  SH:  Social History   Socioeconomic History   Marital status: Married    Spouse name: Not on file   Number of children: Not on file   Years of education: Not on file   Highest education level: Not on file  Occupational History   Not on file  Tobacco Use    Smoking status: Former    Packs/day: 0.50    Types: Cigarettes    Quit date: 07/26/1987    Years since quitting: 33.6   Smokeless tobacco: Never  Vaping Use   Vaping Use: Never used  Substance and Sexual Activity   Alcohol use: No    Alcohol/week: 0.0 standard drinks    Comment: Previously drank socially, last drank 25-30 years ago   Drug use: No   Sexual activity: Not on file  Other Topics Concern   Not on file  Social History Narrative   Not on file   Social Determinants of Health   Financial Resource Strain: Not on file  Food Insecurity: No Food Insecurity   Worried About Running Out of Food in the Last Year: Never true   [place] in the Last Year: Never true  Transportation Needs: No Transportation Needs   Lack of Transportation (Medical): No   Lack of Transportation (Non-Medical): No  Physical Activity: Not on file  Stress: Not on file  Social Connections: Not on file  Intimate Partner Violence: Not on file    FH:  Family History  Problem Relation Age of Onset   Hypertension Mother    Hyperlipidemia Mother    Hypertension Father    Hyperlipidemia Father    Colon cancer Neg Hx     Past Medical History:  Diagnosis Date   Chronic kidney  disease    Diabetes mellitus without complication ([facility])    Dialysis patient ([facility])    Diastolic CHF, acute ([facility]) 10/08/2020   HTN (hypertension)     Current Outpatient Medications  Medication Sig Dispense Refill   amiodarone (PACERONE) 100 MG tablet Take 1 tablet (100 mg total) by mouth daily. 30 tablet 6   apixaban (ELIQUIS) 5 MG TABS tablet Take 1 tablet (5 mg total) by mouth 2 (two) times daily. 60 tablet 11   atorvastatin (LIPITOR) 20 MG tablet Take 1 tablet (20 mg total) by mouth daily. 30 tablet 11   blood glucose meter kit and supplies KIT Dispense based on patient and insurance preference. Use up to 2 times daily as directed. (FOR ICD-10 E11.65) 1 each 5   Blood Glucose Monitoring Suppl (ACCU-CHEK GUIDE ME)  w/Device KIT 1 Piece by Does not apply route as directed. 1 kit 0   glipiZIDE (GLUCOTROL) 5 MG tablet Take 1 tablet (5 mg total) by mouth daily with breakfast. 30 tablet 0   glucose blood (ACCU-CHEK GUIDE) test strip Use to test glucose 2 times a day 100 each 2   hydrocortisone cream 1 % Apply 1 application topically 3 (three) times daily as needed for itching (minor skin irritation). 30 g 0   Lancets (ACCU-CHEK MULTICLIX) lancets Use as instructed bid. E11.65 100 each 3   [name] CARBONATE PO 500 mg. Patient is taking 500 mg tablet (chewable)3 times daily.     oxyCODONE-acetaminophen (PERCOCET) 5-325 MG tablet Take 1 tablet by mouth every 6 (six) hours as needed for severe pain. 8 tablet 0   Polyethyl Glycol-Propyl Glycol (SYSTANE) 0.4-0.3 % SOLN Place 1 drop into both eyes daily as needed (Eye irritation).     torsemide (DEMADEX) 100 MG tablet Take 1 tablet (100 mg total) by mouth daily. 30 tablet 11   No current facility-administered medications for this encounter.    Vitals:   03/23/21 0931  BP: 124/62  Pulse: 96  SpO2: 99%  Weight: 88.5 kg (195 lb)   Wt Readings from Last 3 Encounters:  03/23/21 88.5 kg (195 lb)  03/20/21 87.5 kg (193 lb)  02/25/21 90.5 kg (199 lb 9.6 oz)    PHYSICAL EXAM: General:  Well appearing. No resp difficulty HEENT: normal Neck: supple. no JVD. Carotids 2+ bilat; no bruits. No lymphadenopathy or thryomegaly appreciated. Cor: PMI nondisplaced. Mildly irregular rate & rhythm. No rubs, gallops or murmurs. Lungs: clear Abdomen: soft, nontender, nondistended. No hepatosplenomegaly. No bruits or masses. Good bowel sounds. Extremities: no cyanosis, clubbing, rash, edema LUE fistula Neuro: alert & orientedx3, cranial nerves grossly intact. moves all 4 extremities w/o difficulty. Affect pleasant   ECG: NSR with PACs 80 Personally reviewed   ASSESSMENT & PLAN:   1. PAF (A Flutter)  RVR - Remains in NSR on on amio 100 daily - Continue eliquis 5 mg  twice a day. (Will need to decrease dose to 2.5 bid when he hits 80 y/o) - Check labs today   2. Chronic Biventricular Heart Failure - Had ECHO 2018 and EF 55%. Echo 09/2020 EF < 15%. Suspect tachy-mediated.  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 RCA not injected.  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 Volume managed by HD. Diuretics per renal - Will not add arb/dig/spiro with ESRD  - Will add carvedilol 3.125 bid (hold on mornings   of HD). I d/w Dr. Coladonato by phone who agrees. Will see how he tolerates.    3. 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 ESRD -Started HD in April and has been tolerating without difficulty. -On torsemide per nephrology.    5. DM2 - Hgb A1C 5.9  - On lantus/SSI   6. 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  -Continue PPI   Daniel Bensimhon, MD  10:12 AM  

## 2021-03-24 DIAGNOSIS — N2581 Secondary hyperparathyroidism of renal origin: Secondary | ICD-10-CM | POA: Diagnosis not present

## 2021-03-24 DIAGNOSIS — Z992 Dependence on renal dialysis: Secondary | ICD-10-CM | POA: Diagnosis not present

## 2021-03-24 DIAGNOSIS — D631 Anemia in chronic kidney disease: Secondary | ICD-10-CM | POA: Diagnosis not present

## 2021-03-24 DIAGNOSIS — N186 End stage renal disease: Secondary | ICD-10-CM | POA: Diagnosis not present

## 2021-03-24 DIAGNOSIS — D509 Iron deficiency anemia, unspecified: Secondary | ICD-10-CM | POA: Diagnosis not present

## 2021-03-24 DIAGNOSIS — D689 Coagulation defect, unspecified: Secondary | ICD-10-CM | POA: Diagnosis not present

## 2021-03-25 ENCOUNTER — Other Ambulatory Visit (HOSPITAL_COMMUNITY): Payer: Self-pay | Admitting: Family Medicine

## 2021-03-25 ENCOUNTER — Other Ambulatory Visit: Payer: Self-pay

## 2021-03-25 DIAGNOSIS — Z992 Dependence on renal dialysis: Secondary | ICD-10-CM | POA: Diagnosis not present

## 2021-03-25 DIAGNOSIS — N179 Acute kidney failure, unspecified: Secondary | ICD-10-CM | POA: Diagnosis not present

## 2021-03-25 DIAGNOSIS — N186 End stage renal disease: Secondary | ICD-10-CM | POA: Diagnosis not present

## 2021-03-25 MED ORDER — ONETOUCH VERIO VI STRP: ORAL_STRIP | 0 refills | Status: AC

## 2021-03-25 MED ORDER — ONETOUCH VERIO FLEX SYSTEM W/DEVICE KIT: PACK | 1 refills | Status: DC

## 2021-03-25 MED ORDER — ONETOUCH DELICA LANCETS 33G MISC: 1 refills | Status: AC

## 2021-03-26 DIAGNOSIS — D689 Coagulation defect, unspecified: Secondary | ICD-10-CM | POA: Diagnosis not present

## 2021-03-26 DIAGNOSIS — Z992 Dependence on renal dialysis: Secondary | ICD-10-CM | POA: Diagnosis not present

## 2021-03-26 DIAGNOSIS — N2581 Secondary hyperparathyroidism of renal origin: Secondary | ICD-10-CM | POA: Diagnosis not present

## 2021-03-26 DIAGNOSIS — N186 End stage renal disease: Secondary | ICD-10-CM | POA: Diagnosis not present

## 2021-03-28 DIAGNOSIS — N2581 Secondary hyperparathyroidism of renal origin: Secondary | ICD-10-CM | POA: Diagnosis not present

## 2021-03-28 DIAGNOSIS — Z992 Dependence on renal dialysis: Secondary | ICD-10-CM | POA: Diagnosis not present

## 2021-03-28 DIAGNOSIS — D689 Coagulation defect, unspecified: Secondary | ICD-10-CM | POA: Diagnosis not present

## 2021-03-28 DIAGNOSIS — N186 End stage renal disease: Secondary | ICD-10-CM | POA: Diagnosis not present

## 2021-03-31 DIAGNOSIS — N186 End stage renal disease: Secondary | ICD-10-CM | POA: Diagnosis not present

## 2021-03-31 DIAGNOSIS — N2581 Secondary hyperparathyroidism of renal origin: Secondary | ICD-10-CM | POA: Diagnosis not present

## 2021-03-31 DIAGNOSIS — D509 Iron deficiency anemia, unspecified: Secondary | ICD-10-CM | POA: Diagnosis not present

## 2021-03-31 DIAGNOSIS — D689 Coagulation defect, unspecified: Secondary | ICD-10-CM | POA: Diagnosis not present

## 2021-03-31 DIAGNOSIS — Z992 Dependence on renal dialysis: Secondary | ICD-10-CM | POA: Diagnosis not present

## 2021-04-02 DIAGNOSIS — N2581 Secondary hyperparathyroidism of renal origin: Secondary | ICD-10-CM | POA: Diagnosis not present

## 2021-04-02 DIAGNOSIS — D509 Iron deficiency anemia, unspecified: Secondary | ICD-10-CM | POA: Diagnosis not present

## 2021-04-02 DIAGNOSIS — Z992 Dependence on renal dialysis: Secondary | ICD-10-CM | POA: Diagnosis not present

## 2021-04-02 DIAGNOSIS — N186 End stage renal disease: Secondary | ICD-10-CM | POA: Diagnosis not present

## 2021-04-02 DIAGNOSIS — D689 Coagulation defect, unspecified: Secondary | ICD-10-CM | POA: Diagnosis not present

## 2021-04-03 ENCOUNTER — Other Ambulatory Visit: Payer: Self-pay | Admitting: "Endocrinology

## 2021-04-04 DIAGNOSIS — N186 End stage renal disease: Secondary | ICD-10-CM | POA: Diagnosis not present

## 2021-04-04 DIAGNOSIS — N2581 Secondary hyperparathyroidism of renal origin: Secondary | ICD-10-CM | POA: Diagnosis not present

## 2021-04-04 DIAGNOSIS — D509 Iron deficiency anemia, unspecified: Secondary | ICD-10-CM | POA: Diagnosis not present

## 2021-04-04 DIAGNOSIS — D689 Coagulation defect, unspecified: Secondary | ICD-10-CM | POA: Diagnosis not present

## 2021-04-04 DIAGNOSIS — Z992 Dependence on renal dialysis: Secondary | ICD-10-CM | POA: Diagnosis not present

## 2021-04-06 ENCOUNTER — Other Ambulatory Visit: Payer: Self-pay | Admitting: *Deleted

## 2021-04-06 NOTE — Patient Outreach (Addendum)
[facility]) Care Management  04/06/2021  ASH KOSIOR [birth year] [id number]   Outgoing call placed to member/wife, no answer, HIPAA compliant voice message left.  Will follow up within the next 3-4 business days.   Update:  Voice message received after missed call.  Call placed back to wife, state member is well, was seen by HF clinic and Endocrinology in the last few weeks, no issues noted.  Denies any urgent concerns, encouraged to contact this care manager with questions.  Agrees to follow up within the next month.   Goals Addressed             This Visit's Progress    COMPLETED: [facility] - Find Help in My Community       Timeframe:  Short-Term Goal Priority:  Medium Start Date:       7/12                      Expected End Date:     8/12  Barriers: Financial                   Follow Up Date 03/03/2021    - follow-up on any referrals for help I am given    Why is this important?   Knowing how and where to find help for yourself or family in your neighborhood and community is an important skill.  You will want to take some steps to learn how.    Notes:   7/12 - Referral placed to Pharmacy team ([facility]) as wife report [name] is costing $248 to fill  8/9 - Wife report no follow up call from [facility], had to purchase last month's supply of [name] out of pocket.  Call placed to [facility], spoke with [facility], she will call member/wife directly.       [facility] - Track and Manage My Blood Pressure-Hypertension       Timeframe:  Long-Range Goal Priority:  Medium Start Date:                  9/12           Expected End Date:     10/04/2021                  Barriers: Health Behaviors  Follow Up Date 05/06/2022    - check blood pressure daily - write blood pressure results in a log or diary    Why is this important?   You won't feel high blood pressure, but it can still hurt your blood vessels.  High blood pressure can cause heart or kidney problems.  It can also cause a stroke.  Making lifestyle changes like losing a little weight or eating less salt will help.  Checking your blood pressure at home and at different times of the day can help to control blood pressure.  If the doctor prescribes medicine remember to take it the way the doctor ordered.  Call the office if you cannot afford the medicine or if there are questions about it.     Notes:   9/12 - Member was placed on Coreg during HF visit on 8/29.  Wife confirms that member is taking as instructed.  Encouraged to monitor blood pressure and record readings daily     [facility] - Track and Manage Symptoms-Heart Failure   On track    Timeframe:  Long-Range Goal Priority:  High Start Date:  4/13                    Expected End Date:    10/13                  Barriers: Knowledge   - begin a heart failure diary - develop a rescue plan - know when to call the doctor    Why is this important?   You will be able to handle your symptoms better if you keep track of them.  Making some simple changes to your lifestyle will help.  Eating healthy is one thing you can do to take good care of yourself.    Notes:   5/2 - Discussed follow up appointment with cardiology on 5/16.  Advised that [place] transportation team will contact them for details on arrangements.  Advised to take logs to appointment  6/2 - Was seen by cardiology last week, wife report appointment went well, remains in NSR, denies chest pain or discomfort  7/12 -Has follow up appointment on 7/22 with cardiology and on 8/3 with vascular.  Wife will continue to encouraged daily weights, blood pressure, and HR checks  8/9 - Will follow up with HF clinic on 8/17 for echo and provider appointment.    9/12 - HF appointment on 8/29 completed, will follow up in 9 months. Will continue to monitor HR, BP, and weights daily         Valente David, [place], MSN [place] [phone]

## 2021-04-07 DIAGNOSIS — N2581 Secondary hyperparathyroidism of renal origin: Secondary | ICD-10-CM | POA: Diagnosis not present

## 2021-04-07 DIAGNOSIS — D689 Coagulation defect, unspecified: Secondary | ICD-10-CM | POA: Diagnosis not present

## 2021-04-07 DIAGNOSIS — D509 Iron deficiency anemia, unspecified: Secondary | ICD-10-CM | POA: Diagnosis not present

## 2021-04-07 DIAGNOSIS — N186 End stage renal disease: Secondary | ICD-10-CM | POA: Diagnosis not present

## 2021-04-07 DIAGNOSIS — Z992 Dependence on renal dialysis: Secondary | ICD-10-CM | POA: Diagnosis not present

## 2021-04-09 DIAGNOSIS — D509 Iron deficiency anemia, unspecified: Secondary | ICD-10-CM | POA: Diagnosis not present

## 2021-04-09 DIAGNOSIS — N2581 Secondary hyperparathyroidism of renal origin: Secondary | ICD-10-CM | POA: Diagnosis not present

## 2021-04-09 DIAGNOSIS — D689 Coagulation defect, unspecified: Secondary | ICD-10-CM | POA: Diagnosis not present

## 2021-04-09 DIAGNOSIS — Z992 Dependence on renal dialysis: Secondary | ICD-10-CM | POA: Diagnosis not present

## 2021-04-09 DIAGNOSIS — N186 End stage renal disease: Secondary | ICD-10-CM | POA: Diagnosis not present

## 2021-04-11 DIAGNOSIS — D509 Iron deficiency anemia, unspecified: Secondary | ICD-10-CM | POA: Diagnosis not present

## 2021-04-11 DIAGNOSIS — Z992 Dependence on renal dialysis: Secondary | ICD-10-CM | POA: Diagnosis not present

## 2021-04-11 DIAGNOSIS — N186 End stage renal disease: Secondary | ICD-10-CM | POA: Diagnosis not present

## 2021-04-11 DIAGNOSIS — D689 Coagulation defect, unspecified: Secondary | ICD-10-CM | POA: Diagnosis not present

## 2021-04-11 DIAGNOSIS — N2581 Secondary hyperparathyroidism of renal origin: Secondary | ICD-10-CM | POA: Diagnosis not present

## 2021-04-14 DIAGNOSIS — Z992 Dependence on renal dialysis: Secondary | ICD-10-CM | POA: Diagnosis not present

## 2021-04-14 DIAGNOSIS — N2581 Secondary hyperparathyroidism of renal origin: Secondary | ICD-10-CM | POA: Diagnosis not present

## 2021-04-14 DIAGNOSIS — D509 Iron deficiency anemia, unspecified: Secondary | ICD-10-CM | POA: Diagnosis not present

## 2021-04-14 DIAGNOSIS — E119 Type 2 diabetes mellitus without complications: Secondary | ICD-10-CM | POA: Diagnosis not present

## 2021-04-14 DIAGNOSIS — N186 End stage renal disease: Secondary | ICD-10-CM | POA: Diagnosis not present

## 2021-04-14 DIAGNOSIS — D689 Coagulation defect, unspecified: Secondary | ICD-10-CM | POA: Diagnosis not present

## 2021-04-16 DIAGNOSIS — D689 Coagulation defect, unspecified: Secondary | ICD-10-CM | POA: Diagnosis not present

## 2021-04-16 DIAGNOSIS — E119 Type 2 diabetes mellitus without complications: Secondary | ICD-10-CM | POA: Diagnosis not present

## 2021-04-16 DIAGNOSIS — D509 Iron deficiency anemia, unspecified: Secondary | ICD-10-CM | POA: Diagnosis not present

## 2021-04-16 DIAGNOSIS — N2581 Secondary hyperparathyroidism of renal origin: Secondary | ICD-10-CM | POA: Diagnosis not present

## 2021-04-16 DIAGNOSIS — Z992 Dependence on renal dialysis: Secondary | ICD-10-CM | POA: Diagnosis not present

## 2021-04-16 DIAGNOSIS — N186 End stage renal disease: Secondary | ICD-10-CM | POA: Diagnosis not present

## 2021-04-18 DIAGNOSIS — E119 Type 2 diabetes mellitus without complications: Secondary | ICD-10-CM | POA: Diagnosis not present

## 2021-04-18 DIAGNOSIS — N186 End stage renal disease: Secondary | ICD-10-CM | POA: Diagnosis not present

## 2021-04-18 DIAGNOSIS — D689 Coagulation defect, unspecified: Secondary | ICD-10-CM | POA: Diagnosis not present

## 2021-04-18 DIAGNOSIS — Z992 Dependence on renal dialysis: Secondary | ICD-10-CM | POA: Diagnosis not present

## 2021-04-18 DIAGNOSIS — N2581 Secondary hyperparathyroidism of renal origin: Secondary | ICD-10-CM | POA: Diagnosis not present

## 2021-04-18 DIAGNOSIS — D509 Iron deficiency anemia, unspecified: Secondary | ICD-10-CM | POA: Diagnosis not present

## 2021-04-21 DIAGNOSIS — N186 End stage renal disease: Secondary | ICD-10-CM | POA: Diagnosis not present

## 2021-04-21 DIAGNOSIS — D509 Iron deficiency anemia, unspecified: Secondary | ICD-10-CM | POA: Diagnosis not present

## 2021-04-21 DIAGNOSIS — D631 Anemia in chronic kidney disease: Secondary | ICD-10-CM | POA: Diagnosis not present

## 2021-04-21 DIAGNOSIS — N2581 Secondary hyperparathyroidism of renal origin: Secondary | ICD-10-CM | POA: Diagnosis not present

## 2021-04-21 DIAGNOSIS — D689 Coagulation defect, unspecified: Secondary | ICD-10-CM | POA: Diagnosis not present

## 2021-04-21 DIAGNOSIS — Z992 Dependence on renal dialysis: Secondary | ICD-10-CM | POA: Diagnosis not present

## 2021-04-23 DIAGNOSIS — N2581 Secondary hyperparathyroidism of renal origin: Secondary | ICD-10-CM | POA: Diagnosis not present

## 2021-04-23 DIAGNOSIS — D689 Coagulation defect, unspecified: Secondary | ICD-10-CM | POA: Diagnosis not present

## 2021-04-23 DIAGNOSIS — Z992 Dependence on renal dialysis: Secondary | ICD-10-CM | POA: Diagnosis not present

## 2021-04-23 DIAGNOSIS — N186 End stage renal disease: Secondary | ICD-10-CM | POA: Diagnosis not present

## 2021-04-23 DIAGNOSIS — D509 Iron deficiency anemia, unspecified: Secondary | ICD-10-CM | POA: Diagnosis not present

## 2021-04-23 DIAGNOSIS — D631 Anemia in chronic kidney disease: Secondary | ICD-10-CM | POA: Diagnosis not present

## 2021-04-24 DIAGNOSIS — N179 Acute kidney failure, unspecified: Secondary | ICD-10-CM | POA: Diagnosis not present

## 2021-04-24 DIAGNOSIS — Z23 Encounter for immunization: Secondary | ICD-10-CM | POA: Diagnosis not present

## 2021-04-24 DIAGNOSIS — N186 End stage renal disease: Secondary | ICD-10-CM | POA: Diagnosis not present

## 2021-04-24 DIAGNOSIS — Z992 Dependence on renal dialysis: Secondary | ICD-10-CM | POA: Diagnosis not present

## 2021-04-25 DIAGNOSIS — N2581 Secondary hyperparathyroidism of renal origin: Secondary | ICD-10-CM | POA: Diagnosis not present

## 2021-04-25 DIAGNOSIS — N186 End stage renal disease: Secondary | ICD-10-CM | POA: Diagnosis not present

## 2021-04-25 DIAGNOSIS — Z992 Dependence on renal dialysis: Secondary | ICD-10-CM | POA: Diagnosis not present

## 2021-04-25 DIAGNOSIS — D689 Coagulation defect, unspecified: Secondary | ICD-10-CM | POA: Diagnosis not present

## 2021-04-28 DIAGNOSIS — D509 Iron deficiency anemia, unspecified: Secondary | ICD-10-CM | POA: Diagnosis not present

## 2021-04-28 DIAGNOSIS — N2581 Secondary hyperparathyroidism of renal origin: Secondary | ICD-10-CM | POA: Diagnosis not present

## 2021-04-28 DIAGNOSIS — D689 Coagulation defect, unspecified: Secondary | ICD-10-CM | POA: Diagnosis not present

## 2021-04-28 DIAGNOSIS — Z992 Dependence on renal dialysis: Secondary | ICD-10-CM | POA: Diagnosis not present

## 2021-04-28 DIAGNOSIS — N186 End stage renal disease: Secondary | ICD-10-CM | POA: Diagnosis not present

## 2021-04-30 DIAGNOSIS — D689 Coagulation defect, unspecified: Secondary | ICD-10-CM | POA: Diagnosis not present

## 2021-04-30 DIAGNOSIS — N186 End stage renal disease: Secondary | ICD-10-CM | POA: Diagnosis not present

## 2021-04-30 DIAGNOSIS — D509 Iron deficiency anemia, unspecified: Secondary | ICD-10-CM | POA: Diagnosis not present

## 2021-04-30 DIAGNOSIS — N2581 Secondary hyperparathyroidism of renal origin: Secondary | ICD-10-CM | POA: Diagnosis not present

## 2021-04-30 DIAGNOSIS — Z992 Dependence on renal dialysis: Secondary | ICD-10-CM | POA: Diagnosis not present

## 2021-05-01 DIAGNOSIS — I872 Venous insufficiency (chronic) (peripheral): Secondary | ICD-10-CM | POA: Diagnosis not present

## 2021-05-01 DIAGNOSIS — B351 Tinea unguium: Secondary | ICD-10-CM | POA: Diagnosis not present

## 2021-05-01 DIAGNOSIS — E1151 Type 2 diabetes mellitus with diabetic peripheral angiopathy without gangrene: Secondary | ICD-10-CM | POA: Diagnosis not present

## 2021-05-02 DIAGNOSIS — N2581 Secondary hyperparathyroidism of renal origin: Secondary | ICD-10-CM | POA: Diagnosis not present

## 2021-05-02 DIAGNOSIS — N186 End stage renal disease: Secondary | ICD-10-CM | POA: Diagnosis not present

## 2021-05-02 DIAGNOSIS — D509 Iron deficiency anemia, unspecified: Secondary | ICD-10-CM | POA: Diagnosis not present

## 2021-05-02 DIAGNOSIS — D689 Coagulation defect, unspecified: Secondary | ICD-10-CM | POA: Diagnosis not present

## 2021-05-02 DIAGNOSIS — Z992 Dependence on renal dialysis: Secondary | ICD-10-CM | POA: Diagnosis not present

## 2021-05-05 ENCOUNTER — Other Ambulatory Visit: Payer: Self-pay | Admitting: *Deleted

## 2021-05-05 DIAGNOSIS — D509 Iron deficiency anemia, unspecified: Secondary | ICD-10-CM | POA: Diagnosis not present

## 2021-05-05 DIAGNOSIS — N2581 Secondary hyperparathyroidism of renal origin: Secondary | ICD-10-CM | POA: Diagnosis not present

## 2021-05-05 DIAGNOSIS — Z992 Dependence on renal dialysis: Secondary | ICD-10-CM | POA: Diagnosis not present

## 2021-05-05 DIAGNOSIS — N186 End stage renal disease: Secondary | ICD-10-CM | POA: Diagnosis not present

## 2021-05-05 DIAGNOSIS — D689 Coagulation defect, unspecified: Secondary | ICD-10-CM | POA: Diagnosis not present

## 2021-05-05 NOTE — Patient Outreach (Signed)
  [facility]) Care Management  [place]  05/05/2021   Kyle Hicks 1940-12-02 [id number]   Outgoing call placed to member/wife, no answer, HIPAA compliant voice message left.  Will follow up within the next 3-4 business days.  Valente David, [place], MSN [place] [phone]

## 2021-05-07 DIAGNOSIS — Z992 Dependence on renal dialysis: Secondary | ICD-10-CM | POA: Diagnosis not present

## 2021-05-07 DIAGNOSIS — N2581 Secondary hyperparathyroidism of renal origin: Secondary | ICD-10-CM | POA: Diagnosis not present

## 2021-05-07 DIAGNOSIS — N186 End stage renal disease: Secondary | ICD-10-CM | POA: Diagnosis not present

## 2021-05-07 DIAGNOSIS — D509 Iron deficiency anemia, unspecified: Secondary | ICD-10-CM | POA: Diagnosis not present

## 2021-05-07 DIAGNOSIS — D689 Coagulation defect, unspecified: Secondary | ICD-10-CM | POA: Diagnosis not present

## 2021-05-08 ENCOUNTER — Other Ambulatory Visit: Payer: Self-pay | Admitting: *Deleted

## 2021-05-08 NOTE — Patient Outreach (Signed)
[name]) Care Management  [place]  05/08/2021   Kyle Hicks November 01, 1940 [id number]    Outreach attempt #2, successful.  State she recently had thyroid surgery, spent one day in the hospital.  Kyle Hicks son that lives next door was in the home with Kyle Hicks while wife was having surgery.  Denies any urgent concerns, encouraged to contact this care manager with questions.    Encounter Medications:  Outpatient Encounter Medications as of 05/08/2021  Medication Sig   amiodarone (PACERONE) 100 MG tablet Take 1 tablet (100 mg total) by mouth daily.   apixaban (ELIQUIS) 5 MG TABS tablet Take 1 tablet (5 mg total) by mouth 2 (two) times daily.   atorvastatin (LIPITOR) 20 MG tablet Take 1 tablet (20 mg total) by mouth daily.   blood glucose meter kit and supplies KIT Dispense based on patient and insurance preference. Use up to 2 times daily as directed. (FOR ICD-10 E11.65)   Blood Glucose Monitoring Suppl ([place]) w/Device KIT Use to test blood glucose twice daily. Dx Code: E11.65   carvedilol (COREG) 3.125 MG tablet Take 1 tablet (3.125 mg total) by mouth 2 (two) times daily. DO NOT take on the mornings of dialysis   glipiZIDE (GLUCOTROL) 5 MG tablet Take 1 tablet by mouth once daily with breakfast   glucose blood (ONETOUCH VERIO) test strip Use as instructed to check blood glucose twice daily. Dx Code: E11.65   hydrocortisone cream 1 % Apply 1 application topically 3 (three) times daily as needed for itching (minor skin irritation).   [name] CARBONATE PO 500 mg. Patient is taking 500 mg tablet (chewable)3 times daily.   OneTouch Delica Lancets 92E MISC Use to check blood glucose twice daily. Dx Code: E11.65   oxyCODONE-acetaminophen (PERCOCET) 5-325 MG tablet Take 1 tablet by mouth every 6 (six) hours as needed for severe pain.   Polyethyl Glycol-Propyl Glycol (SYSTANE) 0.4-0.3 % SOLN Place 1 drop into both eyes daily as needed (Eye irritation).    torsemide (DEMADEX) 100 MG tablet Take 1 tablet (100 mg total) by mouth daily.   No facility-administered encounter medications on file as of 05/08/2021.    Functional Status:  In your present state of health, do you have any difficulty performing the following activities: 01/20/2021 10/07/2020  Hearing? [name]  Vision? N N  Difficulty concentrating or making decisions? N N  Walking or climbing stairs? N Y  Dressing or bathing? N N  Doing errands, shopping? N N  Some recent data might be hidden    Fall/Depression Screening: Fall Risk  02/25/2021 11/05/2020 09/19/2018  Falls in the past year? 0 1 0  Number falls in past yr: - 0 -  Injury with Fall? - 0 -  Follow up - - Falls evaluation completed   PHQ 2/9 Scores 11/05/2020 05/19/2018 02/13/2018 06/28/2017 03/14/2017 11/03/2016 08/03/2016  PHQ - 2 Score - 0 0 0 0 0 0  Exception Documentation Other- indicate reason in comment box - - - - - -    Assessment:   [place] : Heart Failure (Adult)  Updates made by Valente David, RN since 05/08/2021 12:00 AM     Problem: Disease Progression (Heart Failure)      Goal: Comorbidities Identified and Managed   Start Date: 04/06/2021  Expected End Date: 07/05/2021  This Visit's Progress: On track  Recent Progress: On track  Note:   5/2 - Assessed and addressed signs/symptoms of comorbidity, including dyslipidemia, diabetes,  iron deficiency, gout, arthritis, dysrhythmia, hypertension, cachexia, coronary artery disease, kidney dysfunction and lung disease.   Prepared patient for laboratory and diagnostic exams based on risk and presentation. (Appointment with cardiology on 5/16)       Goals Addressed             This Visit's Progress    [facility] - Track and Manage My Blood Pressure-Hypertension   On track    Timeframe:  Long-Range Goal Priority:  Medium Start Date:                  9/12           Expected End Date:     10/04/2021                  Barriers: Health  Behaviors  Follow Up Date 05/06/2022    - check blood pressure daily - write blood pressure results in a log or diary    Why is this important?   You won't feel high blood pressure, but it can still hurt your blood vessels.  High blood pressure can cause heart or kidney problems. It can also cause a stroke.  Making lifestyle changes like losing a little weight or eating less salt will help.  Checking your blood pressure at home and at different times of the day can help to control blood pressure.  If the doctor prescribes medicine remember to take it the way the doctor ordered.  Call the office if you cannot afford the medicine or if there are questions about it.     Notes:   9/12 - Kyle Hicks was placed on Coreg during HF visit on 8/29.  Wife confirms that Kyle Hicks is taking as instructed.  Encouraged to monitor blood pressure and record readings daily  10/13 - Wife verbalizes that Kyle Hicks continues to take new medications as instructed, particularly around dialysis days.  No side effects, will continue to monitor BP daily     COMPLETED: [facility] - Track and Manage Symptoms-Heart Failure   On track    Timeframe:  Long-Range Goal Priority:  High Start Date:         4/13                    Expected End Date:    10/13                  Barriers: Knowledge   - begin a heart failure diary - develop a rescue plan - know when to call the doctor    Why is this important?   You will be able to handle your symptoms better if you keep track of them.  Making some simple changes to your lifestyle will help.  Eating healthy is one thing you can do to take good care of yourself.    Notes:   5/2 - Discussed follow up appointment with cardiology on 5/16.  Advised that [place] transportation team will contact them for details on arrangements.  Advised to take logs to appointment  6/2 - Was seen by cardiology last week, wife report appointment went well, remains in NSR, denies chest pain or  discomfort  7/12 -Has follow up appointment on 7/22 with cardiology and on 8/3 with vascular.  Wife will continue to encouraged daily weights, blood pressure, and HR checks  8/9 - Will follow up with HF clinic on 8/17 for echo and provider appointment.    9/12 - HF appointment on 8/29  completed, will follow up in 9 months. Will continue to monitor HR, BP, and weights daily  10/13 - Wife report BP and HR has remained stable, does not provide specific readings.  Has not recorded today but will do so.  Re-educated on acceptable range for HR as well as low sal diet and fluid management        Plan:  Follow-up: Patient agrees to Care Plan and Follow-up. Follow-up in 1 month(s).  Valente David, [place], MSN [place] [phone]

## 2021-05-09 DIAGNOSIS — N2581 Secondary hyperparathyroidism of renal origin: Secondary | ICD-10-CM | POA: Diagnosis not present

## 2021-05-09 DIAGNOSIS — Z992 Dependence on renal dialysis: Secondary | ICD-10-CM | POA: Diagnosis not present

## 2021-05-09 DIAGNOSIS — D689 Coagulation defect, unspecified: Secondary | ICD-10-CM | POA: Diagnosis not present

## 2021-05-09 DIAGNOSIS — N186 End stage renal disease: Secondary | ICD-10-CM | POA: Diagnosis not present

## 2021-05-09 DIAGNOSIS — D509 Iron deficiency anemia, unspecified: Secondary | ICD-10-CM | POA: Diagnosis not present

## 2021-05-12 DIAGNOSIS — N2581 Secondary hyperparathyroidism of renal origin: Secondary | ICD-10-CM | POA: Diagnosis not present

## 2021-05-12 DIAGNOSIS — E119 Type 2 diabetes mellitus without complications: Secondary | ICD-10-CM | POA: Diagnosis not present

## 2021-05-12 DIAGNOSIS — E039 Hypothyroidism, unspecified: Secondary | ICD-10-CM | POA: Diagnosis not present

## 2021-05-12 DIAGNOSIS — D689 Coagulation defect, unspecified: Secondary | ICD-10-CM | POA: Diagnosis not present

## 2021-05-12 DIAGNOSIS — N186 End stage renal disease: Secondary | ICD-10-CM | POA: Diagnosis not present

## 2021-05-12 DIAGNOSIS — D509 Iron deficiency anemia, unspecified: Secondary | ICD-10-CM | POA: Diagnosis not present

## 2021-05-12 DIAGNOSIS — Z992 Dependence on renal dialysis: Secondary | ICD-10-CM | POA: Diagnosis not present

## 2021-05-14 DIAGNOSIS — D509 Iron deficiency anemia, unspecified: Secondary | ICD-10-CM | POA: Diagnosis not present

## 2021-05-14 DIAGNOSIS — E039 Hypothyroidism, unspecified: Secondary | ICD-10-CM | POA: Diagnosis not present

## 2021-05-14 DIAGNOSIS — Z992 Dependence on renal dialysis: Secondary | ICD-10-CM | POA: Diagnosis not present

## 2021-05-14 DIAGNOSIS — D689 Coagulation defect, unspecified: Secondary | ICD-10-CM | POA: Diagnosis not present

## 2021-05-14 DIAGNOSIS — N2581 Secondary hyperparathyroidism of renal origin: Secondary | ICD-10-CM | POA: Diagnosis not present

## 2021-05-14 DIAGNOSIS — N186 End stage renal disease: Secondary | ICD-10-CM | POA: Diagnosis not present

## 2021-05-14 DIAGNOSIS — E119 Type 2 diabetes mellitus without complications: Secondary | ICD-10-CM | POA: Diagnosis not present

## 2021-05-16 DIAGNOSIS — Z992 Dependence on renal dialysis: Secondary | ICD-10-CM | POA: Diagnosis not present

## 2021-05-16 DIAGNOSIS — D689 Coagulation defect, unspecified: Secondary | ICD-10-CM | POA: Diagnosis not present

## 2021-05-16 DIAGNOSIS — N186 End stage renal disease: Secondary | ICD-10-CM | POA: Diagnosis not present

## 2021-05-16 DIAGNOSIS — D509 Iron deficiency anemia, unspecified: Secondary | ICD-10-CM | POA: Diagnosis not present

## 2021-05-16 DIAGNOSIS — N2581 Secondary hyperparathyroidism of renal origin: Secondary | ICD-10-CM | POA: Diagnosis not present

## 2021-05-16 DIAGNOSIS — E119 Type 2 diabetes mellitus without complications: Secondary | ICD-10-CM | POA: Diagnosis not present

## 2021-05-16 DIAGNOSIS — E039 Hypothyroidism, unspecified: Secondary | ICD-10-CM | POA: Diagnosis not present

## 2021-05-19 DIAGNOSIS — D689 Coagulation defect, unspecified: Secondary | ICD-10-CM | POA: Diagnosis not present

## 2021-05-19 DIAGNOSIS — Z992 Dependence on renal dialysis: Secondary | ICD-10-CM | POA: Diagnosis not present

## 2021-05-19 DIAGNOSIS — N2581 Secondary hyperparathyroidism of renal origin: Secondary | ICD-10-CM | POA: Diagnosis not present

## 2021-05-19 DIAGNOSIS — N186 End stage renal disease: Secondary | ICD-10-CM | POA: Diagnosis not present

## 2021-05-19 DIAGNOSIS — D509 Iron deficiency anemia, unspecified: Secondary | ICD-10-CM | POA: Diagnosis not present

## 2021-05-21 DIAGNOSIS — Z992 Dependence on renal dialysis: Secondary | ICD-10-CM | POA: Diagnosis not present

## 2021-05-21 DIAGNOSIS — N186 End stage renal disease: Secondary | ICD-10-CM | POA: Diagnosis not present

## 2021-05-21 DIAGNOSIS — D689 Coagulation defect, unspecified: Secondary | ICD-10-CM | POA: Diagnosis not present

## 2021-05-21 DIAGNOSIS — N2581 Secondary hyperparathyroidism of renal origin: Secondary | ICD-10-CM | POA: Diagnosis not present

## 2021-05-21 DIAGNOSIS — D509 Iron deficiency anemia, unspecified: Secondary | ICD-10-CM | POA: Diagnosis not present

## 2021-05-23 DIAGNOSIS — N2581 Secondary hyperparathyroidism of renal origin: Secondary | ICD-10-CM | POA: Diagnosis not present

## 2021-05-23 DIAGNOSIS — Z992 Dependence on renal dialysis: Secondary | ICD-10-CM | POA: Diagnosis not present

## 2021-05-23 DIAGNOSIS — D689 Coagulation defect, unspecified: Secondary | ICD-10-CM | POA: Diagnosis not present

## 2021-05-23 DIAGNOSIS — D509 Iron deficiency anemia, unspecified: Secondary | ICD-10-CM | POA: Diagnosis not present

## 2021-05-23 DIAGNOSIS — N186 End stage renal disease: Secondary | ICD-10-CM | POA: Diagnosis not present

## 2021-05-25 DIAGNOSIS — Z992 Dependence on renal dialysis: Secondary | ICD-10-CM | POA: Diagnosis not present

## 2021-05-25 DIAGNOSIS — N179 Acute kidney failure, unspecified: Secondary | ICD-10-CM | POA: Diagnosis not present

## 2021-05-25 DIAGNOSIS — N186 End stage renal disease: Secondary | ICD-10-CM | POA: Diagnosis not present

## 2021-05-26 DIAGNOSIS — Z992 Dependence on renal dialysis: Secondary | ICD-10-CM | POA: Diagnosis not present

## 2021-05-26 DIAGNOSIS — N186 End stage renal disease: Secondary | ICD-10-CM | POA: Diagnosis not present

## 2021-05-26 DIAGNOSIS — D509 Iron deficiency anemia, unspecified: Secondary | ICD-10-CM | POA: Diagnosis not present

## 2021-05-26 DIAGNOSIS — N2581 Secondary hyperparathyroidism of renal origin: Secondary | ICD-10-CM | POA: Diagnosis not present

## 2021-05-26 DIAGNOSIS — D689 Coagulation defect, unspecified: Secondary | ICD-10-CM | POA: Diagnosis not present

## 2021-05-26 DIAGNOSIS — D631 Anemia in chronic kidney disease: Secondary | ICD-10-CM | POA: Diagnosis not present

## 2021-05-28 DIAGNOSIS — Z992 Dependence on renal dialysis: Secondary | ICD-10-CM | POA: Diagnosis not present

## 2021-05-28 DIAGNOSIS — N2581 Secondary hyperparathyroidism of renal origin: Secondary | ICD-10-CM | POA: Diagnosis not present

## 2021-05-28 DIAGNOSIS — N186 End stage renal disease: Secondary | ICD-10-CM | POA: Diagnosis not present

## 2021-05-28 DIAGNOSIS — D631 Anemia in chronic kidney disease: Secondary | ICD-10-CM | POA: Diagnosis not present

## 2021-05-28 DIAGNOSIS — D689 Coagulation defect, unspecified: Secondary | ICD-10-CM | POA: Diagnosis not present

## 2021-05-28 DIAGNOSIS — D509 Iron deficiency anemia, unspecified: Secondary | ICD-10-CM | POA: Diagnosis not present

## 2021-05-30 DIAGNOSIS — D689 Coagulation defect, unspecified: Secondary | ICD-10-CM | POA: Diagnosis not present

## 2021-05-30 DIAGNOSIS — D509 Iron deficiency anemia, unspecified: Secondary | ICD-10-CM | POA: Diagnosis not present

## 2021-05-30 DIAGNOSIS — N186 End stage renal disease: Secondary | ICD-10-CM | POA: Diagnosis not present

## 2021-05-30 DIAGNOSIS — D631 Anemia in chronic kidney disease: Secondary | ICD-10-CM | POA: Diagnosis not present

## 2021-05-30 DIAGNOSIS — Z992 Dependence on renal dialysis: Secondary | ICD-10-CM | POA: Diagnosis not present

## 2021-05-30 DIAGNOSIS — N2581 Secondary hyperparathyroidism of renal origin: Secondary | ICD-10-CM | POA: Diagnosis not present

## 2021-06-02 DIAGNOSIS — Z992 Dependence on renal dialysis: Secondary | ICD-10-CM | POA: Diagnosis not present

## 2021-06-02 DIAGNOSIS — D509 Iron deficiency anemia, unspecified: Secondary | ICD-10-CM | POA: Diagnosis not present

## 2021-06-02 DIAGNOSIS — N2581 Secondary hyperparathyroidism of renal origin: Secondary | ICD-10-CM | POA: Diagnosis not present

## 2021-06-02 DIAGNOSIS — D689 Coagulation defect, unspecified: Secondary | ICD-10-CM | POA: Diagnosis not present

## 2021-06-02 DIAGNOSIS — N186 End stage renal disease: Secondary | ICD-10-CM | POA: Diagnosis not present

## 2021-06-04 DIAGNOSIS — N2581 Secondary hyperparathyroidism of renal origin: Secondary | ICD-10-CM | POA: Diagnosis not present

## 2021-06-04 DIAGNOSIS — D509 Iron deficiency anemia, unspecified: Secondary | ICD-10-CM | POA: Diagnosis not present

## 2021-06-04 DIAGNOSIS — N186 End stage renal disease: Secondary | ICD-10-CM | POA: Diagnosis not present

## 2021-06-04 DIAGNOSIS — D689 Coagulation defect, unspecified: Secondary | ICD-10-CM | POA: Diagnosis not present

## 2021-06-04 DIAGNOSIS — Z992 Dependence on renal dialysis: Secondary | ICD-10-CM | POA: Diagnosis not present

## 2021-06-05 ENCOUNTER — Other Ambulatory Visit: Payer: Self-pay | Admitting: *Deleted

## 2021-06-05 NOTE — Patient Outreach (Signed)
[name]) Care Management  [place]  06/05/2021   Kyle Hicks April 28, 1941 [id number]   Outgoing call placed to member's wife, successful.  Denies any urgent concerns, encouraged to contact this care manager with questions.  Notified of ongoing care management with PCP office, verbalizes understanding and agrees.  Encounter Medications:  Outpatient Encounter Medications as of 06/05/2021  Medication Sig   amiodarone (PACERONE) 100 MG tablet Take 1 tablet (100 mg total) by mouth daily.   apixaban (ELIQUIS) 5 MG TABS tablet Take 1 tablet (5 mg total) by mouth 2 (two) times daily.   atorvastatin (LIPITOR) 20 MG tablet Take 1 tablet (20 mg total) by mouth daily.   blood glucose meter kit and supplies KIT Dispense based on patient and insurance preference. Use up to 2 times daily as directed. (FOR ICD-10 E11.65)   Blood Glucose Monitoring Suppl ([place]) w/Device KIT Use to test blood glucose twice daily. Dx Code: E11.65   carvedilol (COREG) 3.125 MG tablet Take 1 tablet (3.125 mg total) by mouth 2 (two) times daily. DO NOT take on the mornings of dialysis   glipiZIDE (GLUCOTROL) 5 MG tablet Take 1 tablet by mouth once daily with breakfast   glucose blood (ONETOUCH VERIO) test strip Use as instructed to check blood glucose twice daily. Dx Code: E11.65   hydrocortisone cream 1 % Apply 1 application topically 3 (three) times daily as needed for itching (minor skin irritation).   [name] CARBONATE PO 500 mg. Patient is taking 500 mg tablet (chewable)3 times daily.   OneTouch Delica Lancets 63S MISC Use to check blood glucose twice daily. Dx Code: E11.65   oxyCODONE-acetaminophen (PERCOCET) 5-325 MG tablet Take 1 tablet by mouth every 6 (six) hours as needed for severe pain.   Polyethyl Glycol-Propyl Glycol (SYSTANE) 0.4-0.3 % SOLN Place 1 drop into both eyes daily as needed (Eye irritation).   torsemide (DEMADEX) 100 MG tablet Take 1 tablet (100  mg total) by mouth daily.   No facility-administered encounter medications on file as of 06/05/2021.    Functional Status:  In your present state of health, do you have any difficulty performing the following activities: 01/20/2021 10/07/2020  Hearing? [name]  Vision? N N  Difficulty concentrating or making decisions? N N  Walking or climbing stairs? N Y  Dressing or bathing? N N  Doing errands, shopping? N N  Some recent data might be hidden    Fall/Depression Screening: Fall Risk  02/25/2021 11/05/2020 09/19/2018  Falls in the past year? 0 1 0  Number falls in past yr: - 0 -  Injury with Fall? - 0 -  Follow up - - Falls evaluation completed   PHQ 2/9 Scores 11/05/2020 05/19/2018 02/13/2018 06/28/2017 03/14/2017 11/03/2016 08/03/2016  PHQ - 2 Score - 0 0 0 0 0 0  Exception Documentation Other- indicate reason in comment box - - - - - -    Assessment:   [name] : Heart Failure (Adult)  Updates made by Valente David, RN since 06/05/2021 12:00 AM     Problem: Disease Progression (Heart Failure)      Goal: Comorbidities Identified and Managed Completed 06/05/2021  Start Date: 04/06/2021  Expected End Date: 07/05/2021  Recent Progress: On track  Note:   5/2 - Assessed and addressed signs/symptoms of comorbidity, including dyslipidemia, diabetes, iron deficiency, gout, arthritis, dysrhythmia, hypertension, cachexia, coronary artery disease, kidney dysfunction and lung disease.   Prepared patient for laboratory and diagnostic  exams based on risk and presentation. (Appointment with cardiology on 5/16)  11/11 - Member doing well, will transition to CCM team at PCP office.     Task: Identify and Manage Comorbidities Completed 06/05/2021  Note:   Care Management Activities:    - medication side effects monitored and managed    Notes:       Goals Addressed             This Visit's Progress    COMPLETED: [facility] - Track and Manage My Blood Pressure-Hypertension        Timeframe:  Long-Range Goal Priority:  Medium Start Date:                  9/12           Expected End Date:     10/04/2021                  Barriers: Health Behaviors  Follow Up Date 05/06/2022    - check blood pressure daily - write blood pressure results in a log or diary    Why is this important?   You won't feel high blood pressure, but it can still hurt your blood vessels.  High blood pressure can cause heart or kidney problems. It can also cause a stroke.  Making lifestyle changes like losing a little weight or eating less salt will help.  Checking your blood pressure at home and at different times of the day can help to control blood pressure.  If the doctor prescribes medicine remember to take it the way the doctor ordered.  Call the office if you cannot afford the medicine or if there are questions about it.     Notes:   9/12 - Member was placed on Coreg during HF visit on 8/29.  Wife confirms that member is taking as instructed.  Encouraged to monitor blood pressure and record readings daily  10/13 - Wife verbalizes that member continues to take new medications as instructed, particularly around dialysis days.  No side effects, will continue to monitor BP daily  11/11 - Per wife, state member she and family remain active with management of health care, member remains stable.  Member doing well, will transition to CCM team at PCP office.        Plan:  Follow-up: Patient requests no follow-up at this time.  Valente David, [place], MSN [place] [phone]

## 2021-06-06 DIAGNOSIS — N2581 Secondary hyperparathyroidism of renal origin: Secondary | ICD-10-CM | POA: Diagnosis not present

## 2021-06-06 DIAGNOSIS — N186 End stage renal disease: Secondary | ICD-10-CM | POA: Diagnosis not present

## 2021-06-06 DIAGNOSIS — D689 Coagulation defect, unspecified: Secondary | ICD-10-CM | POA: Diagnosis not present

## 2021-06-06 DIAGNOSIS — Z992 Dependence on renal dialysis: Secondary | ICD-10-CM | POA: Diagnosis not present

## 2021-06-06 DIAGNOSIS — D509 Iron deficiency anemia, unspecified: Secondary | ICD-10-CM | POA: Diagnosis not present

## 2021-06-09 DIAGNOSIS — D509 Iron deficiency anemia, unspecified: Secondary | ICD-10-CM | POA: Diagnosis not present

## 2021-06-09 DIAGNOSIS — N2581 Secondary hyperparathyroidism of renal origin: Secondary | ICD-10-CM | POA: Diagnosis not present

## 2021-06-09 DIAGNOSIS — N186 End stage renal disease: Secondary | ICD-10-CM | POA: Diagnosis not present

## 2021-06-09 DIAGNOSIS — E119 Type 2 diabetes mellitus without complications: Secondary | ICD-10-CM | POA: Diagnosis not present

## 2021-06-09 DIAGNOSIS — D689 Coagulation defect, unspecified: Secondary | ICD-10-CM | POA: Diagnosis not present

## 2021-06-09 DIAGNOSIS — Z992 Dependence on renal dialysis: Secondary | ICD-10-CM | POA: Diagnosis not present

## 2021-06-11 DIAGNOSIS — N186 End stage renal disease: Secondary | ICD-10-CM | POA: Diagnosis not present

## 2021-06-11 DIAGNOSIS — N2581 Secondary hyperparathyroidism of renal origin: Secondary | ICD-10-CM | POA: Diagnosis not present

## 2021-06-11 DIAGNOSIS — D509 Iron deficiency anemia, unspecified: Secondary | ICD-10-CM | POA: Diagnosis not present

## 2021-06-11 DIAGNOSIS — E119 Type 2 diabetes mellitus without complications: Secondary | ICD-10-CM | POA: Diagnosis not present

## 2021-06-11 DIAGNOSIS — D689 Coagulation defect, unspecified: Secondary | ICD-10-CM | POA: Diagnosis not present

## 2021-06-11 DIAGNOSIS — Z992 Dependence on renal dialysis: Secondary | ICD-10-CM | POA: Diagnosis not present

## 2021-06-13 DIAGNOSIS — N2581 Secondary hyperparathyroidism of renal origin: Secondary | ICD-10-CM | POA: Diagnosis not present

## 2021-06-13 DIAGNOSIS — D509 Iron deficiency anemia, unspecified: Secondary | ICD-10-CM | POA: Diagnosis not present

## 2021-06-13 DIAGNOSIS — D689 Coagulation defect, unspecified: Secondary | ICD-10-CM | POA: Diagnosis not present

## 2021-06-13 DIAGNOSIS — N186 End stage renal disease: Secondary | ICD-10-CM | POA: Diagnosis not present

## 2021-06-13 DIAGNOSIS — E119 Type 2 diabetes mellitus without complications: Secondary | ICD-10-CM | POA: Diagnosis not present

## 2021-06-13 DIAGNOSIS — Z992 Dependence on renal dialysis: Secondary | ICD-10-CM | POA: Diagnosis not present

## 2021-06-16 DIAGNOSIS — D509 Iron deficiency anemia, unspecified: Secondary | ICD-10-CM | POA: Diagnosis not present

## 2021-06-16 DIAGNOSIS — N186 End stage renal disease: Secondary | ICD-10-CM | POA: Diagnosis not present

## 2021-06-16 DIAGNOSIS — N2581 Secondary hyperparathyroidism of renal origin: Secondary | ICD-10-CM | POA: Diagnosis not present

## 2021-06-16 DIAGNOSIS — Z992 Dependence on renal dialysis: Secondary | ICD-10-CM | POA: Diagnosis not present

## 2021-06-16 DIAGNOSIS — D689 Coagulation defect, unspecified: Secondary | ICD-10-CM | POA: Diagnosis not present

## 2021-06-19 DIAGNOSIS — N2581 Secondary hyperparathyroidism of renal origin: Secondary | ICD-10-CM | POA: Diagnosis not present

## 2021-06-19 DIAGNOSIS — D689 Coagulation defect, unspecified: Secondary | ICD-10-CM | POA: Diagnosis not present

## 2021-06-19 DIAGNOSIS — Z992 Dependence on renal dialysis: Secondary | ICD-10-CM | POA: Diagnosis not present

## 2021-06-19 DIAGNOSIS — N186 End stage renal disease: Secondary | ICD-10-CM | POA: Diagnosis not present

## 2021-06-19 DIAGNOSIS — D509 Iron deficiency anemia, unspecified: Secondary | ICD-10-CM | POA: Diagnosis not present

## 2021-06-21 DIAGNOSIS — D689 Coagulation defect, unspecified: Secondary | ICD-10-CM | POA: Diagnosis not present

## 2021-06-21 DIAGNOSIS — D509 Iron deficiency anemia, unspecified: Secondary | ICD-10-CM | POA: Diagnosis not present

## 2021-06-21 DIAGNOSIS — N186 End stage renal disease: Secondary | ICD-10-CM | POA: Diagnosis not present

## 2021-06-21 DIAGNOSIS — Z992 Dependence on renal dialysis: Secondary | ICD-10-CM | POA: Diagnosis not present

## 2021-06-21 DIAGNOSIS — N2581 Secondary hyperparathyroidism of renal origin: Secondary | ICD-10-CM | POA: Diagnosis not present

## 2021-06-23 DIAGNOSIS — D509 Iron deficiency anemia, unspecified: Secondary | ICD-10-CM | POA: Diagnosis not present

## 2021-06-23 DIAGNOSIS — N2581 Secondary hyperparathyroidism of renal origin: Secondary | ICD-10-CM | POA: Diagnosis not present

## 2021-06-23 DIAGNOSIS — Z992 Dependence on renal dialysis: Secondary | ICD-10-CM | POA: Diagnosis not present

## 2021-06-23 DIAGNOSIS — D689 Coagulation defect, unspecified: Secondary | ICD-10-CM | POA: Diagnosis not present

## 2021-06-23 DIAGNOSIS — N186 End stage renal disease: Secondary | ICD-10-CM | POA: Diagnosis not present

## 2021-06-24 DIAGNOSIS — Z992 Dependence on renal dialysis: Secondary | ICD-10-CM | POA: Diagnosis not present

## 2021-06-24 DIAGNOSIS — N186 End stage renal disease: Secondary | ICD-10-CM | POA: Diagnosis not present

## 2021-06-24 DIAGNOSIS — N179 Acute kidney failure, unspecified: Secondary | ICD-10-CM | POA: Diagnosis not present

## 2021-06-25 DIAGNOSIS — D509 Iron deficiency anemia, unspecified: Secondary | ICD-10-CM | POA: Diagnosis not present

## 2021-06-25 DIAGNOSIS — D689 Coagulation defect, unspecified: Secondary | ICD-10-CM | POA: Diagnosis not present

## 2021-06-25 DIAGNOSIS — Z992 Dependence on renal dialysis: Secondary | ICD-10-CM | POA: Diagnosis not present

## 2021-06-25 DIAGNOSIS — N2581 Secondary hyperparathyroidism of renal origin: Secondary | ICD-10-CM | POA: Diagnosis not present

## 2021-06-25 DIAGNOSIS — N186 End stage renal disease: Secondary | ICD-10-CM | POA: Diagnosis not present

## 2021-06-30 DIAGNOSIS — D689 Coagulation defect, unspecified: Secondary | ICD-10-CM | POA: Diagnosis not present

## 2021-06-30 DIAGNOSIS — N2581 Secondary hyperparathyroidism of renal origin: Secondary | ICD-10-CM | POA: Diagnosis not present

## 2021-06-30 DIAGNOSIS — N186 End stage renal disease: Secondary | ICD-10-CM | POA: Diagnosis not present

## 2021-06-30 DIAGNOSIS — D631 Anemia in chronic kidney disease: Secondary | ICD-10-CM | POA: Diagnosis not present

## 2021-06-30 DIAGNOSIS — D509 Iron deficiency anemia, unspecified: Secondary | ICD-10-CM | POA: Diagnosis not present

## 2021-06-30 DIAGNOSIS — Z992 Dependence on renal dialysis: Secondary | ICD-10-CM | POA: Diagnosis not present

## 2021-07-01 DIAGNOSIS — L821 Other seborrheic keratosis: Secondary | ICD-10-CM | POA: Diagnosis not present

## 2021-07-01 DIAGNOSIS — E1101 Type 2 diabetes mellitus with hyperosmolarity with coma: Secondary | ICD-10-CM | POA: Diagnosis not present

## 2021-07-01 DIAGNOSIS — N179 Acute kidney failure, unspecified: Secondary | ICD-10-CM | POA: Diagnosis not present

## 2021-07-01 DIAGNOSIS — Z6830 Body mass index (BMI) 30.0-30.9, adult: Secondary | ICD-10-CM | POA: Diagnosis not present

## 2021-07-01 DIAGNOSIS — E6609 Other obesity due to excess calories: Secondary | ICD-10-CM | POA: Diagnosis not present

## 2021-07-01 DIAGNOSIS — I150 Renovascular hypertension: Secondary | ICD-10-CM | POA: Diagnosis not present

## 2021-07-01 DIAGNOSIS — I509 Heart failure, unspecified: Secondary | ICD-10-CM | POA: Diagnosis not present

## 2021-07-01 DIAGNOSIS — I4892 Unspecified atrial flutter: Secondary | ICD-10-CM | POA: Diagnosis not present

## 2021-07-01 DIAGNOSIS — Z13220 Encounter for screening for lipoid disorders: Secondary | ICD-10-CM | POA: Diagnosis not present

## 2021-07-02 DIAGNOSIS — D509 Iron deficiency anemia, unspecified: Secondary | ICD-10-CM | POA: Diagnosis not present

## 2021-07-02 DIAGNOSIS — N2581 Secondary hyperparathyroidism of renal origin: Secondary | ICD-10-CM | POA: Diagnosis not present

## 2021-07-02 DIAGNOSIS — D689 Coagulation defect, unspecified: Secondary | ICD-10-CM | POA: Diagnosis not present

## 2021-07-02 DIAGNOSIS — D631 Anemia in chronic kidney disease: Secondary | ICD-10-CM | POA: Diagnosis not present

## 2021-07-02 DIAGNOSIS — N186 End stage renal disease: Secondary | ICD-10-CM | POA: Diagnosis not present

## 2021-07-02 DIAGNOSIS — Z992 Dependence on renal dialysis: Secondary | ICD-10-CM | POA: Diagnosis not present

## 2021-07-04 ENCOUNTER — Other Ambulatory Visit: Payer: Self-pay | Admitting: "Endocrinology

## 2021-07-04 DIAGNOSIS — N2581 Secondary hyperparathyroidism of renal origin: Secondary | ICD-10-CM | POA: Diagnosis not present

## 2021-07-04 DIAGNOSIS — D509 Iron deficiency anemia, unspecified: Secondary | ICD-10-CM | POA: Diagnosis not present

## 2021-07-04 DIAGNOSIS — N186 End stage renal disease: Secondary | ICD-10-CM | POA: Diagnosis not present

## 2021-07-04 DIAGNOSIS — D631 Anemia in chronic kidney disease: Secondary | ICD-10-CM | POA: Diagnosis not present

## 2021-07-04 DIAGNOSIS — D689 Coagulation defect, unspecified: Secondary | ICD-10-CM | POA: Diagnosis not present

## 2021-07-04 DIAGNOSIS — Z992 Dependence on renal dialysis: Secondary | ICD-10-CM | POA: Diagnosis not present

## 2021-07-07 DIAGNOSIS — D631 Anemia in chronic kidney disease: Secondary | ICD-10-CM | POA: Diagnosis not present

## 2021-07-07 DIAGNOSIS — D689 Coagulation defect, unspecified: Secondary | ICD-10-CM | POA: Diagnosis not present

## 2021-07-07 DIAGNOSIS — N2581 Secondary hyperparathyroidism of renal origin: Secondary | ICD-10-CM | POA: Diagnosis not present

## 2021-07-07 DIAGNOSIS — N186 End stage renal disease: Secondary | ICD-10-CM | POA: Diagnosis not present

## 2021-07-07 DIAGNOSIS — D509 Iron deficiency anemia, unspecified: Secondary | ICD-10-CM | POA: Diagnosis not present

## 2021-07-07 DIAGNOSIS — Z992 Dependence on renal dialysis: Secondary | ICD-10-CM | POA: Diagnosis not present

## 2021-07-08 ENCOUNTER — Encounter: Payer: Self-pay | Admitting: Internal Medicine

## 2021-07-09 DIAGNOSIS — D509 Iron deficiency anemia, unspecified: Secondary | ICD-10-CM | POA: Diagnosis not present

## 2021-07-09 DIAGNOSIS — D689 Coagulation defect, unspecified: Secondary | ICD-10-CM | POA: Diagnosis not present

## 2021-07-09 DIAGNOSIS — Z992 Dependence on renal dialysis: Secondary | ICD-10-CM | POA: Diagnosis not present

## 2021-07-09 DIAGNOSIS — D631 Anemia in chronic kidney disease: Secondary | ICD-10-CM | POA: Diagnosis not present

## 2021-07-09 DIAGNOSIS — N2581 Secondary hyperparathyroidism of renal origin: Secondary | ICD-10-CM | POA: Diagnosis not present

## 2021-07-09 DIAGNOSIS — N186 End stage renal disease: Secondary | ICD-10-CM | POA: Diagnosis not present

## 2021-07-11 DIAGNOSIS — D509 Iron deficiency anemia, unspecified: Secondary | ICD-10-CM | POA: Diagnosis not present

## 2021-07-11 DIAGNOSIS — D689 Coagulation defect, unspecified: Secondary | ICD-10-CM | POA: Diagnosis not present

## 2021-07-11 DIAGNOSIS — Z992 Dependence on renal dialysis: Secondary | ICD-10-CM | POA: Diagnosis not present

## 2021-07-11 DIAGNOSIS — N2581 Secondary hyperparathyroidism of renal origin: Secondary | ICD-10-CM | POA: Diagnosis not present

## 2021-07-11 DIAGNOSIS — D631 Anemia in chronic kidney disease: Secondary | ICD-10-CM | POA: Diagnosis not present

## 2021-07-11 DIAGNOSIS — N186 End stage renal disease: Secondary | ICD-10-CM | POA: Diagnosis not present

## 2021-07-13 DIAGNOSIS — Z992 Dependence on renal dialysis: Secondary | ICD-10-CM | POA: Diagnosis not present

## 2021-07-13 DIAGNOSIS — N186 End stage renal disease: Secondary | ICD-10-CM | POA: Diagnosis not present

## 2021-07-13 DIAGNOSIS — I871 Compression of vein: Secondary | ICD-10-CM | POA: Diagnosis not present

## 2021-07-13 DIAGNOSIS — T82858A Stenosis of vascular prosthetic devices, implants and grafts, initial encounter: Secondary | ICD-10-CM | POA: Diagnosis not present

## 2021-07-14 DIAGNOSIS — E119 Type 2 diabetes mellitus without complications: Secondary | ICD-10-CM | POA: Diagnosis not present

## 2021-07-14 DIAGNOSIS — D509 Iron deficiency anemia, unspecified: Secondary | ICD-10-CM | POA: Diagnosis not present

## 2021-07-14 DIAGNOSIS — N186 End stage renal disease: Secondary | ICD-10-CM | POA: Diagnosis not present

## 2021-07-14 DIAGNOSIS — Z992 Dependence on renal dialysis: Secondary | ICD-10-CM | POA: Diagnosis not present

## 2021-07-14 DIAGNOSIS — N2581 Secondary hyperparathyroidism of renal origin: Secondary | ICD-10-CM | POA: Diagnosis not present

## 2021-07-14 DIAGNOSIS — D689 Coagulation defect, unspecified: Secondary | ICD-10-CM | POA: Diagnosis not present

## 2021-07-16 DIAGNOSIS — E119 Type 2 diabetes mellitus without complications: Secondary | ICD-10-CM | POA: Diagnosis not present

## 2021-07-16 DIAGNOSIS — D509 Iron deficiency anemia, unspecified: Secondary | ICD-10-CM | POA: Diagnosis not present

## 2021-07-16 DIAGNOSIS — D689 Coagulation defect, unspecified: Secondary | ICD-10-CM | POA: Diagnosis not present

## 2021-07-16 DIAGNOSIS — N2581 Secondary hyperparathyroidism of renal origin: Secondary | ICD-10-CM | POA: Diagnosis not present

## 2021-07-16 DIAGNOSIS — N186 End stage renal disease: Secondary | ICD-10-CM | POA: Diagnosis not present

## 2021-07-16 DIAGNOSIS — Z992 Dependence on renal dialysis: Secondary | ICD-10-CM | POA: Diagnosis not present

## 2021-07-18 DIAGNOSIS — N186 End stage renal disease: Secondary | ICD-10-CM | POA: Diagnosis not present

## 2021-07-18 DIAGNOSIS — D509 Iron deficiency anemia, unspecified: Secondary | ICD-10-CM | POA: Diagnosis not present

## 2021-07-18 DIAGNOSIS — Z992 Dependence on renal dialysis: Secondary | ICD-10-CM | POA: Diagnosis not present

## 2021-07-18 DIAGNOSIS — E119 Type 2 diabetes mellitus without complications: Secondary | ICD-10-CM | POA: Diagnosis not present

## 2021-07-18 DIAGNOSIS — N2581 Secondary hyperparathyroidism of renal origin: Secondary | ICD-10-CM | POA: Diagnosis not present

## 2021-07-18 DIAGNOSIS — D689 Coagulation defect, unspecified: Secondary | ICD-10-CM | POA: Diagnosis not present

## 2021-07-21 DIAGNOSIS — N186 End stage renal disease: Secondary | ICD-10-CM | POA: Diagnosis not present

## 2021-07-21 DIAGNOSIS — D509 Iron deficiency anemia, unspecified: Secondary | ICD-10-CM | POA: Diagnosis not present

## 2021-07-21 DIAGNOSIS — Z992 Dependence on renal dialysis: Secondary | ICD-10-CM | POA: Diagnosis not present

## 2021-07-21 DIAGNOSIS — N2581 Secondary hyperparathyroidism of renal origin: Secondary | ICD-10-CM | POA: Diagnosis not present

## 2021-07-21 DIAGNOSIS — D689 Coagulation defect, unspecified: Secondary | ICD-10-CM | POA: Diagnosis not present

## 2021-07-21 DIAGNOSIS — E876 Hypokalemia: Secondary | ICD-10-CM | POA: Diagnosis not present

## 2021-07-23 DIAGNOSIS — D509 Iron deficiency anemia, unspecified: Secondary | ICD-10-CM | POA: Diagnosis not present

## 2021-07-23 DIAGNOSIS — N186 End stage renal disease: Secondary | ICD-10-CM | POA: Diagnosis not present

## 2021-07-23 DIAGNOSIS — Z992 Dependence on renal dialysis: Secondary | ICD-10-CM | POA: Diagnosis not present

## 2021-07-23 DIAGNOSIS — N2581 Secondary hyperparathyroidism of renal origin: Secondary | ICD-10-CM | POA: Diagnosis not present

## 2021-07-23 DIAGNOSIS — E876 Hypokalemia: Secondary | ICD-10-CM | POA: Diagnosis not present

## 2021-07-23 DIAGNOSIS — D689 Coagulation defect, unspecified: Secondary | ICD-10-CM | POA: Diagnosis not present

## 2021-07-25 DIAGNOSIS — Z992 Dependence on renal dialysis: Secondary | ICD-10-CM | POA: Diagnosis not present

## 2021-07-25 DIAGNOSIS — N186 End stage renal disease: Secondary | ICD-10-CM | POA: Diagnosis not present

## 2021-07-25 DIAGNOSIS — N179 Acute kidney failure, unspecified: Secondary | ICD-10-CM | POA: Diagnosis not present

## 2021-07-25 DIAGNOSIS — D689 Coagulation defect, unspecified: Secondary | ICD-10-CM | POA: Diagnosis not present

## 2021-07-25 DIAGNOSIS — D509 Iron deficiency anemia, unspecified: Secondary | ICD-10-CM | POA: Diagnosis not present

## 2021-07-25 DIAGNOSIS — N2581 Secondary hyperparathyroidism of renal origin: Secondary | ICD-10-CM | POA: Diagnosis not present

## 2021-07-25 DIAGNOSIS — E876 Hypokalemia: Secondary | ICD-10-CM | POA: Diagnosis not present

## 2021-07-28 DIAGNOSIS — N186 End stage renal disease: Secondary | ICD-10-CM | POA: Diagnosis not present

## 2021-07-28 DIAGNOSIS — D689 Coagulation defect, unspecified: Secondary | ICD-10-CM | POA: Diagnosis not present

## 2021-07-28 DIAGNOSIS — N2581 Secondary hyperparathyroidism of renal origin: Secondary | ICD-10-CM | POA: Diagnosis not present

## 2021-07-28 DIAGNOSIS — E876 Hypokalemia: Secondary | ICD-10-CM | POA: Diagnosis not present

## 2021-07-28 DIAGNOSIS — D509 Iron deficiency anemia, unspecified: Secondary | ICD-10-CM | POA: Diagnosis not present

## 2021-07-28 DIAGNOSIS — Z992 Dependence on renal dialysis: Secondary | ICD-10-CM | POA: Diagnosis not present

## 2021-07-30 ENCOUNTER — Ambulatory Visit: Payer: Medicare HMO | Admitting: "Endocrinology

## 2021-07-30 DIAGNOSIS — E876 Hypokalemia: Secondary | ICD-10-CM | POA: Diagnosis not present

## 2021-07-30 DIAGNOSIS — Z992 Dependence on renal dialysis: Secondary | ICD-10-CM | POA: Diagnosis not present

## 2021-07-30 DIAGNOSIS — N2581 Secondary hyperparathyroidism of renal origin: Secondary | ICD-10-CM | POA: Diagnosis not present

## 2021-07-30 DIAGNOSIS — D689 Coagulation defect, unspecified: Secondary | ICD-10-CM | POA: Diagnosis not present

## 2021-07-30 DIAGNOSIS — N186 End stage renal disease: Secondary | ICD-10-CM | POA: Diagnosis not present

## 2021-07-30 DIAGNOSIS — D509 Iron deficiency anemia, unspecified: Secondary | ICD-10-CM | POA: Diagnosis not present

## 2021-07-31 ENCOUNTER — Ambulatory Visit: Payer: Medicare HMO | Admitting: "Endocrinology

## 2021-08-01 DIAGNOSIS — D509 Iron deficiency anemia, unspecified: Secondary | ICD-10-CM | POA: Diagnosis not present

## 2021-08-01 DIAGNOSIS — E876 Hypokalemia: Secondary | ICD-10-CM | POA: Diagnosis not present

## 2021-08-01 DIAGNOSIS — D689 Coagulation defect, unspecified: Secondary | ICD-10-CM | POA: Diagnosis not present

## 2021-08-01 DIAGNOSIS — N2581 Secondary hyperparathyroidism of renal origin: Secondary | ICD-10-CM | POA: Diagnosis not present

## 2021-08-01 DIAGNOSIS — N186 End stage renal disease: Secondary | ICD-10-CM | POA: Diagnosis not present

## 2021-08-01 DIAGNOSIS — Z992 Dependence on renal dialysis: Secondary | ICD-10-CM | POA: Diagnosis not present

## 2021-08-04 DIAGNOSIS — N2581 Secondary hyperparathyroidism of renal origin: Secondary | ICD-10-CM | POA: Diagnosis not present

## 2021-08-04 DIAGNOSIS — Z992 Dependence on renal dialysis: Secondary | ICD-10-CM | POA: Diagnosis not present

## 2021-08-04 DIAGNOSIS — D631 Anemia in chronic kidney disease: Secondary | ICD-10-CM | POA: Diagnosis not present

## 2021-08-04 DIAGNOSIS — E876 Hypokalemia: Secondary | ICD-10-CM | POA: Diagnosis not present

## 2021-08-04 DIAGNOSIS — N186 End stage renal disease: Secondary | ICD-10-CM | POA: Diagnosis not present

## 2021-08-04 DIAGNOSIS — D689 Coagulation defect, unspecified: Secondary | ICD-10-CM | POA: Diagnosis not present

## 2021-08-04 DIAGNOSIS — D509 Iron deficiency anemia, unspecified: Secondary | ICD-10-CM | POA: Diagnosis not present

## 2021-08-06 DIAGNOSIS — N186 End stage renal disease: Secondary | ICD-10-CM | POA: Diagnosis not present

## 2021-08-06 DIAGNOSIS — N2581 Secondary hyperparathyroidism of renal origin: Secondary | ICD-10-CM | POA: Diagnosis not present

## 2021-08-06 DIAGNOSIS — D509 Iron deficiency anemia, unspecified: Secondary | ICD-10-CM | POA: Diagnosis not present

## 2021-08-06 DIAGNOSIS — Z992 Dependence on renal dialysis: Secondary | ICD-10-CM | POA: Diagnosis not present

## 2021-08-06 DIAGNOSIS — D689 Coagulation defect, unspecified: Secondary | ICD-10-CM | POA: Diagnosis not present

## 2021-08-06 DIAGNOSIS — E876 Hypokalemia: Secondary | ICD-10-CM | POA: Diagnosis not present

## 2021-08-06 DIAGNOSIS — D631 Anemia in chronic kidney disease: Secondary | ICD-10-CM | POA: Diagnosis not present

## 2021-08-07 DIAGNOSIS — E1165 Type 2 diabetes mellitus with hyperglycemia: Secondary | ICD-10-CM | POA: Diagnosis not present

## 2021-08-07 DIAGNOSIS — E1122 Type 2 diabetes mellitus with diabetic chronic kidney disease: Secondary | ICD-10-CM | POA: Diagnosis not present

## 2021-08-07 DIAGNOSIS — N183 Chronic kidney disease, stage 3 unspecified: Secondary | ICD-10-CM | POA: Diagnosis not present

## 2021-08-08 DIAGNOSIS — N2581 Secondary hyperparathyroidism of renal origin: Secondary | ICD-10-CM | POA: Diagnosis not present

## 2021-08-08 DIAGNOSIS — E876 Hypokalemia: Secondary | ICD-10-CM | POA: Diagnosis not present

## 2021-08-08 DIAGNOSIS — Z992 Dependence on renal dialysis: Secondary | ICD-10-CM | POA: Diagnosis not present

## 2021-08-08 DIAGNOSIS — N186 End stage renal disease: Secondary | ICD-10-CM | POA: Diagnosis not present

## 2021-08-08 DIAGNOSIS — D689 Coagulation defect, unspecified: Secondary | ICD-10-CM | POA: Diagnosis not present

## 2021-08-08 DIAGNOSIS — D509 Iron deficiency anemia, unspecified: Secondary | ICD-10-CM | POA: Diagnosis not present

## 2021-08-08 DIAGNOSIS — D631 Anemia in chronic kidney disease: Secondary | ICD-10-CM | POA: Diagnosis not present

## 2021-08-08 LAB — LIPID PANEL
Chol/HDL Ratio: 2 ratio (ref 0.0–5.0)
Cholesterol, Total: 151 mg/dL (ref 100–199)
HDL: 76 mg/dL (ref >39)
LDL Chol Calc (NIH): 60 mg/dL (ref 0–99)
Triglycerides: 82 mg/dL (ref 0–149)
VLDL Cholesterol Cal: 15 mg/dL (ref 5–40)

## 2021-08-08 LAB — TSH: TSH: 1.16 u[IU]/mL (ref 0.450–4.500)

## 2021-08-08 LAB — T4, FREE: Free T4: 1.75 ng/dL (ref 0.82–1.77)

## 2021-08-11 DIAGNOSIS — D689 Coagulation defect, unspecified: Secondary | ICD-10-CM | POA: Diagnosis not present

## 2021-08-11 DIAGNOSIS — N2581 Secondary hyperparathyroidism of renal origin: Secondary | ICD-10-CM | POA: Diagnosis not present

## 2021-08-11 DIAGNOSIS — E039 Hypothyroidism, unspecified: Secondary | ICD-10-CM | POA: Diagnosis not present

## 2021-08-11 DIAGNOSIS — N186 End stage renal disease: Secondary | ICD-10-CM | POA: Diagnosis not present

## 2021-08-11 DIAGNOSIS — E119 Type 2 diabetes mellitus without complications: Secondary | ICD-10-CM | POA: Diagnosis not present

## 2021-08-11 DIAGNOSIS — D509 Iron deficiency anemia, unspecified: Secondary | ICD-10-CM | POA: Diagnosis not present

## 2021-08-11 DIAGNOSIS — Z992 Dependence on renal dialysis: Secondary | ICD-10-CM | POA: Diagnosis not present

## 2021-08-13 DIAGNOSIS — N2581 Secondary hyperparathyroidism of renal origin: Secondary | ICD-10-CM | POA: Diagnosis not present

## 2021-08-13 DIAGNOSIS — D509 Iron deficiency anemia, unspecified: Secondary | ICD-10-CM | POA: Diagnosis not present

## 2021-08-13 DIAGNOSIS — Z992 Dependence on renal dialysis: Secondary | ICD-10-CM | POA: Diagnosis not present

## 2021-08-13 DIAGNOSIS — D689 Coagulation defect, unspecified: Secondary | ICD-10-CM | POA: Diagnosis not present

## 2021-08-13 DIAGNOSIS — E119 Type 2 diabetes mellitus without complications: Secondary | ICD-10-CM | POA: Diagnosis not present

## 2021-08-13 DIAGNOSIS — N186 End stage renal disease: Secondary | ICD-10-CM | POA: Diagnosis not present

## 2021-08-13 DIAGNOSIS — E039 Hypothyroidism, unspecified: Secondary | ICD-10-CM | POA: Diagnosis not present

## 2021-08-14 ENCOUNTER — Other Ambulatory Visit: Payer: Self-pay

## 2021-08-14 ENCOUNTER — Encounter: Payer: Self-pay | Admitting: "Endocrinology

## 2021-08-14 ENCOUNTER — Ambulatory Visit (INDEPENDENT_AMBULATORY_CARE_PROVIDER_SITE_OTHER): Payer: Medicare HMO | Admitting: "Endocrinology

## 2021-08-14 VITALS — BP 100/50 | Ht 66.5 in | Wt 195.0 lb

## 2021-08-14 DIAGNOSIS — E782 Mixed hyperlipidemia: Secondary | ICD-10-CM

## 2021-08-14 DIAGNOSIS — E1122 Type 2 diabetes mellitus with diabetic chronic kidney disease: Secondary | ICD-10-CM

## 2021-08-14 DIAGNOSIS — N186 End stage renal disease: Secondary | ICD-10-CM | POA: Diagnosis not present

## 2021-08-14 DIAGNOSIS — I10 Essential (primary) hypertension: Secondary | ICD-10-CM

## 2021-08-14 LAB — POCT GLYCOSYLATED HEMOGLOBIN (HGB A1C): HbA1c, POC (controlled diabetic range): 5.6 % (ref 0.0–7.0)

## 2021-08-14 NOTE — Patient Instructions (Signed)

## 2021-08-14 NOTE — Progress Notes (Signed)
08/14/2021  Endocrinology follow-up note   Subjective:    Patient ID: Kyle Hicks, male    DOB: 1941/06/23, PCP Sharilyn Sites, MD   Past Medical History:  Diagnosis Date   Chronic kidney disease    Diabetes mellitus without complication [facility])    Dialysis patient [facility])    Diastolic CHF, acute ([place]) 10/08/2020   HTN (hypertension)    Past Surgical History:  Procedure Laterality Date   AV FISTULA PLACEMENT Left 01/22/2021   Procedure: LEFT UPPER ARM ARTERIOVENOUS GORE-TEX GRAFT;  Surgeon: [name], MD;  Location: [facility] ORS;  Service: Vascular;  Laterality: Left;   CARDIOVERSION N/A 10/10/2020   Procedure: CARDIOVERSION;  Surgeon: Lelon Perla, MD;  Location: [facility] ENDOSCOPY;  Service: Cardiovascular;  Laterality: N/A;   ESOPHAGOGASTRODUODENOSCOPY N/A 04/27/2017   Procedure: ESOPHAGOGASTRODUODENOSCOPY (EGD);  Surgeon: Rogene Houston, MD;  Location: [facility] ENDO SUITE;  Service: Endoscopy;  Laterality: N/A;   IR FLUORO GUIDE CV LINE RIGHT  10/23/2020   IR US GUIDANCE  10/23/2020   RIGHT HEART CATH N/A 10/16/2020   Procedure: RIGHT HEART CATH;  Surgeon: Jolaine Artist, MD;  Location: [place] CV LAB;  Service: Cardiovascular;  Laterality: N/A;   RIGHT HEART CATH AND CORONARY ANGIOGRAPHY N/A 10/24/2020   Procedure: RIGHT HEART CATH AND CORONARY ANGIOGRAPHY;  Surgeon: Jolaine Artist, MD;  Location: [place] CV LAB;  Service: Cardiovascular;  Laterality: N/A;   TEE WITHOUT CARDIOVERSION N/A 10/10/2020   Procedure: TRANSESOPHAGEAL ECHOCARDIOGRAM (TEE);  Surgeon: Lelon Perla, MD;  Location: [facility] ENDOSCOPY;  Service: Cardiovascular;  Laterality: N/A;   TEMPORARY DIALYSIS CATHETER  10/16/2020   Procedure: TEMPORARY DIALYSIS CATHETER;  Surgeon: Jolaine Artist, MD;  Location: [place] CV LAB;  Service: Cardiovascular;;   Social History   Socioeconomic History   Marital status: Married    Spouse name: Not on file   Number of children: Not on file   Years of education:  Not on file   Highest education level: Not on file  Occupational History   Not on file  Tobacco Use   Smoking status: Former    Packs/day: 0.50    Types: Cigarettes    Quit date: 07/26/1987    Years since quitting: 34.0   Smokeless tobacco: Never  Vaping Use   Vaping Use: Never used  Substance and Sexual Activity   Alcohol use: No    Alcohol/week: 0.0 standard drinks    Comment: Previously drank socially, last drank 25-30 years ago   Drug use: No   Sexual activity: Not on file  Other Topics Concern   Not on file  Social History Narrative   Not on file   Social Determinants of Health   Financial Resource Strain: Not on file  Food Insecurity: No Food Insecurity   Worried About Running Out of Food in the Last Year: Never true   [place] in the Last Year: Never true  Transportation Needs: No Transportation Needs   Lack of Transportation (Medical): No   Lack of Transportation (Non-Medical): No  Physical Activity: Not on file  Stress: Not on file  Social Connections: Not on file   Outpatient Encounter Medications as of 08/14/2021  Medication Sig   hydrocortisone cream 1 % Apply 1 application topically 3 (three) times daily as needed for itching (minor skin irritation).   amiodarone (PACERONE) 100 MG tablet Take 1 tablet (100 mg total) by mouth daily.   apixaban (ELIQUIS) 5 MG TABS tablet Take 1  tablet (5 mg total) by mouth 2 (two) times daily.   atorvastatin (LIPITOR) 20 MG tablet Take 1 tablet (20 mg total) by mouth daily.   blood glucose meter kit and supplies KIT Dispense based on patient and insurance preference. Use up to 2 times daily as directed. (FOR ICD-10 E11.65)   Blood Glucose Monitoring Suppl ([place]) w/Device KIT Use to test blood glucose twice daily. Dx Code: E11.65   carvedilol (COREG) 3.125 MG tablet Take 1 tablet (3.125 mg total) by mouth 2 (two) times daily. DO NOT take on the mornings of dialysis   glipiZIDE (GLUCOTROL) 5 MG tablet  Take 1 tablet by mouth once daily with breakfast   glucose blood (ONETOUCH VERIO) test strip Use as instructed to check blood glucose twice daily. Dx Code: E11.65   [name] CARBONATE PO 500 mg. Patient is taking 500 mg tablet (chewable)3 times daily.   OneTouch Delica Lancets 70V MISC Use to check blood glucose twice daily. Dx Code: E11.65   oxyCODONE-acetaminophen (PERCOCET) 5-325 MG tablet Take 1 tablet by mouth every 6 (six) hours as needed for severe pain. (Patient not taking: Reported on 08/14/2021)   Polyethyl Glycol-Propyl Glycol (SYSTANE) 0.4-0.3 % SOLN Place 1 drop into both eyes daily as needed (Eye irritation).   torsemide (DEMADEX) 100 MG tablet Take 1 tablet (100 mg total) by mouth daily.   No facility-administered encounter medications on file as of 08/14/2021.   ALLERGIES: No Known Allergies VACCINATION STATUS: Immunization History  Administered Date(s) Administered   Moderna Sars-Covid-2 Vaccination 09/23/2019, 10/27/2019    Diabetes He presents for his follow-up diabetic visit. He has type 2 diabetes mellitus. The initial diagnosis of diabetes was made 2 years ago. His disease course has been improving. There are no hypoglycemic associated symptoms. Pertinent negatives for hypoglycemia include no confusion, headaches, pallor or seizures. There are no diabetic associated symptoms. Pertinent negatives for diabetes include no chest pain, no fatigue, no polydipsia, no polyphagia, no polyuria and no weakness. There are no hypoglycemic complications. Symptoms are improving. Diabetic complications include nephropathy. Risk factors for coronary artery disease include diabetes mellitus, dyslipidemia, hypertension, male sex, obesity, sedentary lifestyle and tobacco exposure. He is compliant with treatment most of the time. His weight is decreasing steadily (He lost approximately 60 pounds since last visit.). He is following a generally unhealthy diet. He has had a previous visit with a  dietitian. He never participates in exercise. His home blood glucose trend is decreasing steadily. His breakfast blood glucose range is generally 130-140 mg/dl. His overall blood glucose range is 130-140 mg/dl. (He did not bring his meter nor logs.  He reports blood glucose ranges from 111-165 at fasting.  His point-of-care A1c is 5.6%.   He denies any hypoglycemia. ) An ACE inhibitor/angiotensin II receptor blocker is being taken.  Hypertension This is a chronic problem. The current episode started more than 1 year ago. The problem is uncontrolled. Pertinent negatives include no chest pain, headaches, neck pain, palpitations or shortness of breath. Risk factors for coronary artery disease include diabetes mellitus, dyslipidemia, obesity, sedentary lifestyle and smoking/tobacco exposure. Past treatments include angiotensin blockers. Identifiable causes of hypertension include chronic renal disease.  Hyperlipidemia This is a chronic problem. The current episode started more than 1 year ago. Exacerbating diseases include chronic renal disease, diabetes and obesity. Pertinent negatives include no chest pain, myalgias or shortness of breath. Current antihyperlipidemic treatment includes statins. Risk factors for coronary artery disease include diabetes mellitus, dyslipidemia, hypertension, family history, male  sex, obesity and a sedentary lifestyle.   Review of Systems  Constitutional:  Negative for fatigue and unexpected weight change.  HENT:  Negative for dental problem, mouth sores and trouble swallowing.   Eyes:  Negative for visual disturbance.  Respiratory:  Negative for cough, choking, chest tightness, shortness of breath and wheezing.   Cardiovascular:  Negative for chest pain, palpitations and leg swelling.  Gastrointestinal:  Negative for abdominal distention, abdominal pain, constipation, diarrhea, nausea and vomiting.  Endocrine: Negative for polydipsia, polyphagia and polyuria.   Genitourinary:  Negative for dysuria, flank pain, hematuria and urgency.  Musculoskeletal:  Negative for back pain, gait problem, myalgias and neck pain.  Skin:  Negative for pallor, rash and wound.  Neurological:  Negative for seizures, syncope, weakness, numbness and headaches.  Psychiatric/Behavioral: Negative.  Negative for confusion and dysphoric mood.    Objective:    BP (!) 100/50    Ht 5' 6.5" (1.689 m)    Wt 195 lb (88.5 kg)    BMI 31.00 kg/m   Wt Readings from Last 3 Encounters:  08/14/21 195 lb (88.5 kg)  03/23/21 195 lb (88.5 kg)  03/20/21 193 lb (87.5 kg)      Results for orders placed or performed in visit on 08/14/21  HgB A1c  Result Value Ref Range   Hemoglobin A1C     HbA1c POC (<> result, manual entry)     HbA1c, POC (prediabetic range)     HbA1c, POC (controlled diabetic range) 5.6 0.0 - 7.0 %   Diabetic Labs (most recent): Lab Results  Component Value Date   HGBA1C 5.6 08/14/2021   HGBA1C 5.9 (H) 10/07/2020   HGBA1C 6.5 (A) 04/14/2020   Lipid Panel     Component Value Date/Time   CHOL 151 08/07/2021 0904   TRIG 82 08/07/2021 0904   HDL 76 08/07/2021 0904   CHOLHDL 2.0 08/07/2021 0904   CHOLHDL 3.1 04/08/2020 0724   VLDL 14 01/19/2016 0719   LDLCALC 60 08/07/2021 0904   LDLCALC 85 04/08/2020 0724     Assessment & Plan:   1. 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     - He remains at a high risk for more acute and chronic complications of diabetes which include CAD, CVA, retinopathy, and neuropathy. These are all discussed in detail with the patient.  He did not bring his meter nor logs.  He reports blood glucose ranges from 111-165 at fasting.  His point-of-care A1c is 5.6%.   He denies any hypoglycemia.  - I have re-counseled the patient on diet management and weight loss  by adopting a carbohydrate restricted / protein rich  Diet.  - he acknowledges that there is a room for improvement in his food and drink  choices. - Suggestion is made for him to avoid simple carbohydrates  from his diet including Cakes, Sweet Desserts, Ice Cream, Soda (diet and regular), Sweet Tea, Candies, Chips, Cookies, Store Bought Juices, Alcohol in Excess of  1-2 drinks a day, Artificial Sweeteners,  Coffee Creamer, and "Sugar-free" Products, Lemonade. This will help patient to have more stable blood glucose profile and potentially avoid unintended weight gain.  - Patient is advised to stick to a routine mealtimes to eat 3 meals  a day and avoid unnecessary snacks ( to snack only to correct hypoglycemia).  - I have approached patient with the following individualized plan to manage diabetes and patient agrees.   -Emphasizing the need for monitoring blood glucose at  least twice a day-daily before breakfast and at bedtime, he is advised to continue glipizide 5 mg p.o. daily at breakfast.   -This is only diabetes medication he will be kept on for now. -He is encouraged to call clinic for blood glucose readings below 70 or greater than 200 mg/dl at fasting. - Patient specific target  for A1c; LDL, HDL, Triglycerides,  were discussed in detail.  2) BP/HTN: His blood pressure is controlled to target.  He is only on torsemide 100 mg p.o. daily.  He is advised to maintain close follow-up with his nephrologist.  3) Lipids/HPL: His recent lipid panel showed LDL 85.  He is advised to continue atorvastatin 20 mg p.o. nightly.  Side effects and precautions discussed with him.     4)  Weight/Diet: His BMI is 31.0-recently dropping from 43.3 during his last visit.  He has lost weight related to initiation of hemodialysis in the interval.     5) Chronic Care/Health Maintenance:  -Patient is on ACEI/ARB and Statin medications and encouraged to continue to follow up with Ophthalmology, nephrology, cardiology, podiatrist at least yearly or according to recommendations, and advised to stay away from smoking. I have recommended yearly flu  vaccine and pneumonia vaccination at least every 5 years; moderate intensity exercise for up to 150 minutes weekly; and  sleep for at least 7 hours a day.  He is currently on amiodarone related to his recent diagnosis of tachyarrhythmia-atrial flutter/atrial fibrillation.  He will need thyroid function test at least twice a year.  His last thyroid function test from August 07, 2021 showed TSH 1.16, free T4 1.75, this is consistent with euthyroid presentation.      - I advised patient to maintain close follow up with Sharilyn Sites, MD for primary care needs.    I spent 41 minutes in the care of the patient today including review of labs from [place], Lipids, Thyroid Function, Hematology (current and previous including abstractions from other facilities); face-to-face time discussing  his blood glucose readings/logs, discussing hypoglycemia and hyperglycemia episodes and symptoms, medications doses, his options of short and long term treatment based on the latest standards of care / guidelines;  discussion about incorporating lifestyle medicine;  and documenting the encounter.    Please refer to Patient Instructions for Blood Glucose Monitoring and Insulin/Medications Dosing Guide"  in media tab for additional information. Please  also refer to " Patient Self Inventory" in the Media  tab for reviewed elements of pertinent patient history.  Kyle Hicks participated in the discussions, expressed understanding, and voiced agreement with the above plans.  All questions were answered to his satisfaction. he is encouraged to contact clinic should he have any questions or concerns prior to his return visit.    Follow up plan: -Return in about 6 months (around 02/11/2022) for Bring Meter and Logs- A1c in Office.  Glade Lloyd, MD Phone: [phone]  Fax: [phone]  -  This note was partially dictated with voice recognition software. Similar sounding words can be transcribed inadequately or may not  be  corrected upon review.  08/14/2021, 12:32 PM

## 2021-08-15 DIAGNOSIS — Z992 Dependence on renal dialysis: Secondary | ICD-10-CM | POA: Diagnosis not present

## 2021-08-15 DIAGNOSIS — D689 Coagulation defect, unspecified: Secondary | ICD-10-CM | POA: Diagnosis not present

## 2021-08-15 DIAGNOSIS — D509 Iron deficiency anemia, unspecified: Secondary | ICD-10-CM | POA: Diagnosis not present

## 2021-08-15 DIAGNOSIS — E119 Type 2 diabetes mellitus without complications: Secondary | ICD-10-CM | POA: Diagnosis not present

## 2021-08-15 DIAGNOSIS — E039 Hypothyroidism, unspecified: Secondary | ICD-10-CM | POA: Diagnosis not present

## 2021-08-15 DIAGNOSIS — N2581 Secondary hyperparathyroidism of renal origin: Secondary | ICD-10-CM | POA: Diagnosis not present

## 2021-08-15 DIAGNOSIS — N186 End stage renal disease: Secondary | ICD-10-CM | POA: Diagnosis not present

## 2021-08-18 DIAGNOSIS — D509 Iron deficiency anemia, unspecified: Secondary | ICD-10-CM | POA: Diagnosis not present

## 2021-08-18 DIAGNOSIS — E876 Hypokalemia: Secondary | ICD-10-CM | POA: Diagnosis not present

## 2021-08-18 DIAGNOSIS — N186 End stage renal disease: Secondary | ICD-10-CM | POA: Diagnosis not present

## 2021-08-18 DIAGNOSIS — N2581 Secondary hyperparathyroidism of renal origin: Secondary | ICD-10-CM | POA: Diagnosis not present

## 2021-08-18 DIAGNOSIS — D631 Anemia in chronic kidney disease: Secondary | ICD-10-CM | POA: Diagnosis not present

## 2021-08-18 DIAGNOSIS — Z992 Dependence on renal dialysis: Secondary | ICD-10-CM | POA: Diagnosis not present

## 2021-08-18 DIAGNOSIS — D689 Coagulation defect, unspecified: Secondary | ICD-10-CM | POA: Diagnosis not present

## 2021-08-20 DIAGNOSIS — E876 Hypokalemia: Secondary | ICD-10-CM | POA: Diagnosis not present

## 2021-08-20 DIAGNOSIS — N2581 Secondary hyperparathyroidism of renal origin: Secondary | ICD-10-CM | POA: Diagnosis not present

## 2021-08-20 DIAGNOSIS — N186 End stage renal disease: Secondary | ICD-10-CM | POA: Diagnosis not present

## 2021-08-20 DIAGNOSIS — D509 Iron deficiency anemia, unspecified: Secondary | ICD-10-CM | POA: Diagnosis not present

## 2021-08-20 DIAGNOSIS — D631 Anemia in chronic kidney disease: Secondary | ICD-10-CM | POA: Diagnosis not present

## 2021-08-20 DIAGNOSIS — Z992 Dependence on renal dialysis: Secondary | ICD-10-CM | POA: Diagnosis not present

## 2021-08-20 DIAGNOSIS — D689 Coagulation defect, unspecified: Secondary | ICD-10-CM | POA: Diagnosis not present

## 2021-08-22 DIAGNOSIS — N2581 Secondary hyperparathyroidism of renal origin: Secondary | ICD-10-CM | POA: Diagnosis not present

## 2021-08-22 DIAGNOSIS — Z992 Dependence on renal dialysis: Secondary | ICD-10-CM | POA: Diagnosis not present

## 2021-08-22 DIAGNOSIS — D631 Anemia in chronic kidney disease: Secondary | ICD-10-CM | POA: Diagnosis not present

## 2021-08-22 DIAGNOSIS — E876 Hypokalemia: Secondary | ICD-10-CM | POA: Diagnosis not present

## 2021-08-22 DIAGNOSIS — D689 Coagulation defect, unspecified: Secondary | ICD-10-CM | POA: Diagnosis not present

## 2021-08-22 DIAGNOSIS — D509 Iron deficiency anemia, unspecified: Secondary | ICD-10-CM | POA: Diagnosis not present

## 2021-08-22 DIAGNOSIS — N186 End stage renal disease: Secondary | ICD-10-CM | POA: Diagnosis not present

## 2021-08-25 DIAGNOSIS — D689 Coagulation defect, unspecified: Secondary | ICD-10-CM | POA: Diagnosis not present

## 2021-08-25 DIAGNOSIS — I150 Renovascular hypertension: Secondary | ICD-10-CM | POA: Diagnosis not present

## 2021-08-25 DIAGNOSIS — D509 Iron deficiency anemia, unspecified: Secondary | ICD-10-CM | POA: Diagnosis not present

## 2021-08-25 DIAGNOSIS — N2581 Secondary hyperparathyroidism of renal origin: Secondary | ICD-10-CM | POA: Diagnosis not present

## 2021-08-25 DIAGNOSIS — Z992 Dependence on renal dialysis: Secondary | ICD-10-CM | POA: Diagnosis not present

## 2021-08-25 DIAGNOSIS — N186 End stage renal disease: Secondary | ICD-10-CM | POA: Diagnosis not present

## 2021-08-27 DIAGNOSIS — D509 Iron deficiency anemia, unspecified: Secondary | ICD-10-CM | POA: Diagnosis not present

## 2021-08-27 DIAGNOSIS — E876 Hypokalemia: Secondary | ICD-10-CM | POA: Diagnosis not present

## 2021-08-27 DIAGNOSIS — Z992 Dependence on renal dialysis: Secondary | ICD-10-CM | POA: Diagnosis not present

## 2021-08-27 DIAGNOSIS — N186 End stage renal disease: Secondary | ICD-10-CM | POA: Diagnosis not present

## 2021-08-27 DIAGNOSIS — N2581 Secondary hyperparathyroidism of renal origin: Secondary | ICD-10-CM | POA: Diagnosis not present

## 2021-08-27 DIAGNOSIS — D689 Coagulation defect, unspecified: Secondary | ICD-10-CM | POA: Diagnosis not present

## 2021-08-29 DIAGNOSIS — E876 Hypokalemia: Secondary | ICD-10-CM | POA: Diagnosis not present

## 2021-08-29 DIAGNOSIS — Z992 Dependence on renal dialysis: Secondary | ICD-10-CM | POA: Diagnosis not present

## 2021-08-29 DIAGNOSIS — N186 End stage renal disease: Secondary | ICD-10-CM | POA: Diagnosis not present

## 2021-08-29 DIAGNOSIS — N2581 Secondary hyperparathyroidism of renal origin: Secondary | ICD-10-CM | POA: Diagnosis not present

## 2021-08-29 DIAGNOSIS — D509 Iron deficiency anemia, unspecified: Secondary | ICD-10-CM | POA: Diagnosis not present

## 2021-08-29 DIAGNOSIS — D689 Coagulation defect, unspecified: Secondary | ICD-10-CM | POA: Diagnosis not present

## 2021-09-01 DIAGNOSIS — D631 Anemia in chronic kidney disease: Secondary | ICD-10-CM | POA: Diagnosis not present

## 2021-09-01 DIAGNOSIS — Z992 Dependence on renal dialysis: Secondary | ICD-10-CM | POA: Diagnosis not present

## 2021-09-01 DIAGNOSIS — D689 Coagulation defect, unspecified: Secondary | ICD-10-CM | POA: Diagnosis not present

## 2021-09-01 DIAGNOSIS — E876 Hypokalemia: Secondary | ICD-10-CM | POA: Diagnosis not present

## 2021-09-01 DIAGNOSIS — N186 End stage renal disease: Secondary | ICD-10-CM | POA: Diagnosis not present

## 2021-09-01 DIAGNOSIS — D509 Iron deficiency anemia, unspecified: Secondary | ICD-10-CM | POA: Diagnosis not present

## 2021-09-01 DIAGNOSIS — N2581 Secondary hyperparathyroidism of renal origin: Secondary | ICD-10-CM | POA: Diagnosis not present

## 2021-09-03 DIAGNOSIS — Z992 Dependence on renal dialysis: Secondary | ICD-10-CM | POA: Diagnosis not present

## 2021-09-03 DIAGNOSIS — D631 Anemia in chronic kidney disease: Secondary | ICD-10-CM | POA: Diagnosis not present

## 2021-09-03 DIAGNOSIS — N2581 Secondary hyperparathyroidism of renal origin: Secondary | ICD-10-CM | POA: Diagnosis not present

## 2021-09-03 DIAGNOSIS — D689 Coagulation defect, unspecified: Secondary | ICD-10-CM | POA: Diagnosis not present

## 2021-09-03 DIAGNOSIS — E876 Hypokalemia: Secondary | ICD-10-CM | POA: Diagnosis not present

## 2021-09-03 DIAGNOSIS — N186 End stage renal disease: Secondary | ICD-10-CM | POA: Diagnosis not present

## 2021-09-03 DIAGNOSIS — D509 Iron deficiency anemia, unspecified: Secondary | ICD-10-CM | POA: Diagnosis not present

## 2021-09-05 DIAGNOSIS — N186 End stage renal disease: Secondary | ICD-10-CM | POA: Diagnosis not present

## 2021-09-05 DIAGNOSIS — D631 Anemia in chronic kidney disease: Secondary | ICD-10-CM | POA: Diagnosis not present

## 2021-09-05 DIAGNOSIS — Z992 Dependence on renal dialysis: Secondary | ICD-10-CM | POA: Diagnosis not present

## 2021-09-05 DIAGNOSIS — N2581 Secondary hyperparathyroidism of renal origin: Secondary | ICD-10-CM | POA: Diagnosis not present

## 2021-09-05 DIAGNOSIS — D689 Coagulation defect, unspecified: Secondary | ICD-10-CM | POA: Diagnosis not present

## 2021-09-05 DIAGNOSIS — E876 Hypokalemia: Secondary | ICD-10-CM | POA: Diagnosis not present

## 2021-09-05 DIAGNOSIS — D509 Iron deficiency anemia, unspecified: Secondary | ICD-10-CM | POA: Diagnosis not present

## 2021-09-08 DIAGNOSIS — E119 Type 2 diabetes mellitus without complications: Secondary | ICD-10-CM | POA: Diagnosis not present

## 2021-09-08 DIAGNOSIS — D689 Coagulation defect, unspecified: Secondary | ICD-10-CM | POA: Diagnosis not present

## 2021-09-08 DIAGNOSIS — N2581 Secondary hyperparathyroidism of renal origin: Secondary | ICD-10-CM | POA: Diagnosis not present

## 2021-09-08 DIAGNOSIS — D509 Iron deficiency anemia, unspecified: Secondary | ICD-10-CM | POA: Diagnosis not present

## 2021-09-08 DIAGNOSIS — Z992 Dependence on renal dialysis: Secondary | ICD-10-CM | POA: Diagnosis not present

## 2021-09-08 DIAGNOSIS — N186 End stage renal disease: Secondary | ICD-10-CM | POA: Diagnosis not present

## 2021-09-08 DIAGNOSIS — D631 Anemia in chronic kidney disease: Secondary | ICD-10-CM | POA: Diagnosis not present

## 2021-09-10 DIAGNOSIS — E119 Type 2 diabetes mellitus without complications: Secondary | ICD-10-CM | POA: Diagnosis not present

## 2021-09-10 DIAGNOSIS — D689 Coagulation defect, unspecified: Secondary | ICD-10-CM | POA: Diagnosis not present

## 2021-09-10 DIAGNOSIS — N2581 Secondary hyperparathyroidism of renal origin: Secondary | ICD-10-CM | POA: Diagnosis not present

## 2021-09-10 DIAGNOSIS — Z992 Dependence on renal dialysis: Secondary | ICD-10-CM | POA: Diagnosis not present

## 2021-09-10 DIAGNOSIS — N186 End stage renal disease: Secondary | ICD-10-CM | POA: Diagnosis not present

## 2021-09-10 DIAGNOSIS — D631 Anemia in chronic kidney disease: Secondary | ICD-10-CM | POA: Diagnosis not present

## 2021-09-10 DIAGNOSIS — D509 Iron deficiency anemia, unspecified: Secondary | ICD-10-CM | POA: Diagnosis not present

## 2021-09-12 DIAGNOSIS — N2581 Secondary hyperparathyroidism of renal origin: Secondary | ICD-10-CM | POA: Diagnosis not present

## 2021-09-12 DIAGNOSIS — N186 End stage renal disease: Secondary | ICD-10-CM | POA: Diagnosis not present

## 2021-09-12 DIAGNOSIS — E119 Type 2 diabetes mellitus without complications: Secondary | ICD-10-CM | POA: Diagnosis not present

## 2021-09-12 DIAGNOSIS — Z992 Dependence on renal dialysis: Secondary | ICD-10-CM | POA: Diagnosis not present

## 2021-09-12 DIAGNOSIS — D689 Coagulation defect, unspecified: Secondary | ICD-10-CM | POA: Diagnosis not present

## 2021-09-12 DIAGNOSIS — D631 Anemia in chronic kidney disease: Secondary | ICD-10-CM | POA: Diagnosis not present

## 2021-09-12 DIAGNOSIS — D509 Iron deficiency anemia, unspecified: Secondary | ICD-10-CM | POA: Diagnosis not present

## 2021-09-15 DIAGNOSIS — N2581 Secondary hyperparathyroidism of renal origin: Secondary | ICD-10-CM | POA: Diagnosis not present

## 2021-09-15 DIAGNOSIS — D631 Anemia in chronic kidney disease: Secondary | ICD-10-CM | POA: Diagnosis not present

## 2021-09-15 DIAGNOSIS — E876 Hypokalemia: Secondary | ICD-10-CM | POA: Diagnosis not present

## 2021-09-15 DIAGNOSIS — D689 Coagulation defect, unspecified: Secondary | ICD-10-CM | POA: Diagnosis not present

## 2021-09-15 DIAGNOSIS — D509 Iron deficiency anemia, unspecified: Secondary | ICD-10-CM | POA: Diagnosis not present

## 2021-09-15 DIAGNOSIS — Z992 Dependence on renal dialysis: Secondary | ICD-10-CM | POA: Diagnosis not present

## 2021-09-15 DIAGNOSIS — N186 End stage renal disease: Secondary | ICD-10-CM | POA: Diagnosis not present

## 2021-09-17 DIAGNOSIS — N2581 Secondary hyperparathyroidism of renal origin: Secondary | ICD-10-CM | POA: Diagnosis not present

## 2021-09-17 DIAGNOSIS — Z992 Dependence on renal dialysis: Secondary | ICD-10-CM | POA: Diagnosis not present

## 2021-09-17 DIAGNOSIS — N186 End stage renal disease: Secondary | ICD-10-CM | POA: Diagnosis not present

## 2021-09-17 DIAGNOSIS — D631 Anemia in chronic kidney disease: Secondary | ICD-10-CM | POA: Diagnosis not present

## 2021-09-17 DIAGNOSIS — D689 Coagulation defect, unspecified: Secondary | ICD-10-CM | POA: Diagnosis not present

## 2021-09-17 DIAGNOSIS — E876 Hypokalemia: Secondary | ICD-10-CM | POA: Diagnosis not present

## 2021-09-17 DIAGNOSIS — D509 Iron deficiency anemia, unspecified: Secondary | ICD-10-CM | POA: Diagnosis not present

## 2021-09-19 DIAGNOSIS — E876 Hypokalemia: Secondary | ICD-10-CM | POA: Diagnosis not present

## 2021-09-19 DIAGNOSIS — D689 Coagulation defect, unspecified: Secondary | ICD-10-CM | POA: Diagnosis not present

## 2021-09-19 DIAGNOSIS — D631 Anemia in chronic kidney disease: Secondary | ICD-10-CM | POA: Diagnosis not present

## 2021-09-19 DIAGNOSIS — N186 End stage renal disease: Secondary | ICD-10-CM | POA: Diagnosis not present

## 2021-09-19 DIAGNOSIS — Z992 Dependence on renal dialysis: Secondary | ICD-10-CM | POA: Diagnosis not present

## 2021-09-19 DIAGNOSIS — N2581 Secondary hyperparathyroidism of renal origin: Secondary | ICD-10-CM | POA: Diagnosis not present

## 2021-09-19 DIAGNOSIS — D509 Iron deficiency anemia, unspecified: Secondary | ICD-10-CM | POA: Diagnosis not present

## 2021-09-22 DIAGNOSIS — Z992 Dependence on renal dialysis: Secondary | ICD-10-CM | POA: Diagnosis not present

## 2021-09-22 DIAGNOSIS — I150 Renovascular hypertension: Secondary | ICD-10-CM | POA: Diagnosis not present

## 2021-09-22 DIAGNOSIS — N2581 Secondary hyperparathyroidism of renal origin: Secondary | ICD-10-CM | POA: Diagnosis not present

## 2021-09-22 DIAGNOSIS — D509 Iron deficiency anemia, unspecified: Secondary | ICD-10-CM | POA: Diagnosis not present

## 2021-09-22 DIAGNOSIS — N186 End stage renal disease: Secondary | ICD-10-CM | POA: Diagnosis not present

## 2021-09-22 DIAGNOSIS — D689 Coagulation defect, unspecified: Secondary | ICD-10-CM | POA: Diagnosis not present

## 2021-09-24 DIAGNOSIS — Z992 Dependence on renal dialysis: Secondary | ICD-10-CM | POA: Diagnosis not present

## 2021-09-24 DIAGNOSIS — D689 Coagulation defect, unspecified: Secondary | ICD-10-CM | POA: Diagnosis not present

## 2021-09-24 DIAGNOSIS — N186 End stage renal disease: Secondary | ICD-10-CM | POA: Diagnosis not present

## 2021-09-24 DIAGNOSIS — N2581 Secondary hyperparathyroidism of renal origin: Secondary | ICD-10-CM | POA: Diagnosis not present

## 2021-09-24 DIAGNOSIS — E876 Hypokalemia: Secondary | ICD-10-CM | POA: Diagnosis not present

## 2021-09-26 DIAGNOSIS — N2581 Secondary hyperparathyroidism of renal origin: Secondary | ICD-10-CM | POA: Diagnosis not present

## 2021-09-26 DIAGNOSIS — D689 Coagulation defect, unspecified: Secondary | ICD-10-CM | POA: Diagnosis not present

## 2021-09-26 DIAGNOSIS — Z992 Dependence on renal dialysis: Secondary | ICD-10-CM | POA: Diagnosis not present

## 2021-09-26 DIAGNOSIS — E876 Hypokalemia: Secondary | ICD-10-CM | POA: Diagnosis not present

## 2021-09-26 DIAGNOSIS — N186 End stage renal disease: Secondary | ICD-10-CM | POA: Diagnosis not present

## 2021-09-29 DIAGNOSIS — D509 Iron deficiency anemia, unspecified: Secondary | ICD-10-CM | POA: Diagnosis not present

## 2021-09-29 DIAGNOSIS — N186 End stage renal disease: Secondary | ICD-10-CM | POA: Diagnosis not present

## 2021-09-29 DIAGNOSIS — Z992 Dependence on renal dialysis: Secondary | ICD-10-CM | POA: Diagnosis not present

## 2021-09-29 DIAGNOSIS — E876 Hypokalemia: Secondary | ICD-10-CM | POA: Diagnosis not present

## 2021-09-29 DIAGNOSIS — N2581 Secondary hyperparathyroidism of renal origin: Secondary | ICD-10-CM | POA: Diagnosis not present

## 2021-09-29 DIAGNOSIS — D689 Coagulation defect, unspecified: Secondary | ICD-10-CM | POA: Diagnosis not present

## 2021-09-29 DIAGNOSIS — D631 Anemia in chronic kidney disease: Secondary | ICD-10-CM | POA: Diagnosis not present

## 2021-10-01 ENCOUNTER — Other Ambulatory Visit: Payer: Self-pay | Admitting: "Endocrinology

## 2021-10-01 DIAGNOSIS — D689 Coagulation defect, unspecified: Secondary | ICD-10-CM | POA: Diagnosis not present

## 2021-10-01 DIAGNOSIS — E876 Hypokalemia: Secondary | ICD-10-CM | POA: Diagnosis not present

## 2021-10-01 DIAGNOSIS — N2581 Secondary hyperparathyroidism of renal origin: Secondary | ICD-10-CM | POA: Diagnosis not present

## 2021-10-01 DIAGNOSIS — N186 End stage renal disease: Secondary | ICD-10-CM | POA: Diagnosis not present

## 2021-10-01 DIAGNOSIS — D631 Anemia in chronic kidney disease: Secondary | ICD-10-CM | POA: Diagnosis not present

## 2021-10-01 DIAGNOSIS — Z992 Dependence on renal dialysis: Secondary | ICD-10-CM | POA: Diagnosis not present

## 2021-10-01 DIAGNOSIS — D509 Iron deficiency anemia, unspecified: Secondary | ICD-10-CM | POA: Diagnosis not present

## 2021-10-03 DIAGNOSIS — D631 Anemia in chronic kidney disease: Secondary | ICD-10-CM | POA: Diagnosis not present

## 2021-10-03 DIAGNOSIS — D509 Iron deficiency anemia, unspecified: Secondary | ICD-10-CM | POA: Diagnosis not present

## 2021-10-03 DIAGNOSIS — N186 End stage renal disease: Secondary | ICD-10-CM | POA: Diagnosis not present

## 2021-10-03 DIAGNOSIS — D689 Coagulation defect, unspecified: Secondary | ICD-10-CM | POA: Diagnosis not present

## 2021-10-03 DIAGNOSIS — Z992 Dependence on renal dialysis: Secondary | ICD-10-CM | POA: Diagnosis not present

## 2021-10-03 DIAGNOSIS — N2581 Secondary hyperparathyroidism of renal origin: Secondary | ICD-10-CM | POA: Diagnosis not present

## 2021-10-03 DIAGNOSIS — E876 Hypokalemia: Secondary | ICD-10-CM | POA: Diagnosis not present

## 2021-10-06 DIAGNOSIS — N2581 Secondary hyperparathyroidism of renal origin: Secondary | ICD-10-CM | POA: Diagnosis not present

## 2021-10-06 DIAGNOSIS — D689 Coagulation defect, unspecified: Secondary | ICD-10-CM | POA: Diagnosis not present

## 2021-10-06 DIAGNOSIS — D509 Iron deficiency anemia, unspecified: Secondary | ICD-10-CM | POA: Diagnosis not present

## 2021-10-06 DIAGNOSIS — E119 Type 2 diabetes mellitus without complications: Secondary | ICD-10-CM | POA: Diagnosis not present

## 2021-10-06 DIAGNOSIS — Z992 Dependence on renal dialysis: Secondary | ICD-10-CM | POA: Diagnosis not present

## 2021-10-06 DIAGNOSIS — N186 End stage renal disease: Secondary | ICD-10-CM | POA: Diagnosis not present

## 2021-10-08 DIAGNOSIS — N186 End stage renal disease: Secondary | ICD-10-CM | POA: Diagnosis not present

## 2021-10-08 DIAGNOSIS — Z992 Dependence on renal dialysis: Secondary | ICD-10-CM | POA: Diagnosis not present

## 2021-10-08 DIAGNOSIS — N2581 Secondary hyperparathyroidism of renal origin: Secondary | ICD-10-CM | POA: Diagnosis not present

## 2021-10-08 DIAGNOSIS — E119 Type 2 diabetes mellitus without complications: Secondary | ICD-10-CM | POA: Diagnosis not present

## 2021-10-08 DIAGNOSIS — D509 Iron deficiency anemia, unspecified: Secondary | ICD-10-CM | POA: Diagnosis not present

## 2021-10-08 DIAGNOSIS — D689 Coagulation defect, unspecified: Secondary | ICD-10-CM | POA: Diagnosis not present

## 2021-10-10 DIAGNOSIS — Z992 Dependence on renal dialysis: Secondary | ICD-10-CM | POA: Diagnosis not present

## 2021-10-10 DIAGNOSIS — N186 End stage renal disease: Secondary | ICD-10-CM | POA: Diagnosis not present

## 2021-10-10 DIAGNOSIS — N2581 Secondary hyperparathyroidism of renal origin: Secondary | ICD-10-CM | POA: Diagnosis not present

## 2021-10-10 DIAGNOSIS — D689 Coagulation defect, unspecified: Secondary | ICD-10-CM | POA: Diagnosis not present

## 2021-10-10 DIAGNOSIS — D509 Iron deficiency anemia, unspecified: Secondary | ICD-10-CM | POA: Diagnosis not present

## 2021-10-10 DIAGNOSIS — E119 Type 2 diabetes mellitus without complications: Secondary | ICD-10-CM | POA: Diagnosis not present

## 2021-10-13 DIAGNOSIS — D631 Anemia in chronic kidney disease: Secondary | ICD-10-CM | POA: Diagnosis not present

## 2021-10-13 DIAGNOSIS — Z992 Dependence on renal dialysis: Secondary | ICD-10-CM | POA: Diagnosis not present

## 2021-10-13 DIAGNOSIS — E876 Hypokalemia: Secondary | ICD-10-CM | POA: Diagnosis not present

## 2021-10-13 DIAGNOSIS — D509 Iron deficiency anemia, unspecified: Secondary | ICD-10-CM | POA: Diagnosis not present

## 2021-10-13 DIAGNOSIS — N2581 Secondary hyperparathyroidism of renal origin: Secondary | ICD-10-CM | POA: Diagnosis not present

## 2021-10-13 DIAGNOSIS — D689 Coagulation defect, unspecified: Secondary | ICD-10-CM | POA: Diagnosis not present

## 2021-10-13 DIAGNOSIS — N186 End stage renal disease: Secondary | ICD-10-CM | POA: Diagnosis not present

## 2021-10-15 DIAGNOSIS — D689 Coagulation defect, unspecified: Secondary | ICD-10-CM | POA: Diagnosis not present

## 2021-10-15 DIAGNOSIS — Z992 Dependence on renal dialysis: Secondary | ICD-10-CM | POA: Diagnosis not present

## 2021-10-15 DIAGNOSIS — N2581 Secondary hyperparathyroidism of renal origin: Secondary | ICD-10-CM | POA: Diagnosis not present

## 2021-10-15 DIAGNOSIS — D631 Anemia in chronic kidney disease: Secondary | ICD-10-CM | POA: Diagnosis not present

## 2021-10-15 DIAGNOSIS — N186 End stage renal disease: Secondary | ICD-10-CM | POA: Diagnosis not present

## 2021-10-15 DIAGNOSIS — E876 Hypokalemia: Secondary | ICD-10-CM | POA: Diagnosis not present

## 2021-10-15 DIAGNOSIS — D509 Iron deficiency anemia, unspecified: Secondary | ICD-10-CM | POA: Diagnosis not present

## 2021-10-17 DIAGNOSIS — E876 Hypokalemia: Secondary | ICD-10-CM | POA: Diagnosis not present

## 2021-10-17 DIAGNOSIS — D509 Iron deficiency anemia, unspecified: Secondary | ICD-10-CM | POA: Diagnosis not present

## 2021-10-17 DIAGNOSIS — D689 Coagulation defect, unspecified: Secondary | ICD-10-CM | POA: Diagnosis not present

## 2021-10-17 DIAGNOSIS — N186 End stage renal disease: Secondary | ICD-10-CM | POA: Diagnosis not present

## 2021-10-17 DIAGNOSIS — Z992 Dependence on renal dialysis: Secondary | ICD-10-CM | POA: Diagnosis not present

## 2021-10-17 DIAGNOSIS — D631 Anemia in chronic kidney disease: Secondary | ICD-10-CM | POA: Diagnosis not present

## 2021-10-17 DIAGNOSIS — N2581 Secondary hyperparathyroidism of renal origin: Secondary | ICD-10-CM | POA: Diagnosis not present

## 2021-10-20 ENCOUNTER — Other Ambulatory Visit (HOSPITAL_COMMUNITY): Payer: Self-pay | Admitting: Adult Health

## 2021-10-20 DIAGNOSIS — Z992 Dependence on renal dialysis: Secondary | ICD-10-CM | POA: Diagnosis not present

## 2021-10-20 DIAGNOSIS — E876 Hypokalemia: Secondary | ICD-10-CM | POA: Diagnosis not present

## 2021-10-20 DIAGNOSIS — N186 End stage renal disease: Secondary | ICD-10-CM | POA: Diagnosis not present

## 2021-10-20 DIAGNOSIS — N2581 Secondary hyperparathyroidism of renal origin: Secondary | ICD-10-CM | POA: Diagnosis not present

## 2021-10-20 DIAGNOSIS — D509 Iron deficiency anemia, unspecified: Secondary | ICD-10-CM | POA: Diagnosis not present

## 2021-10-20 DIAGNOSIS — D689 Coagulation defect, unspecified: Secondary | ICD-10-CM | POA: Diagnosis not present

## 2021-10-21 DIAGNOSIS — I509 Heart failure, unspecified: Secondary | ICD-10-CM | POA: Diagnosis not present

## 2021-10-21 DIAGNOSIS — Z008 Encounter for other general examination: Secondary | ICD-10-CM | POA: Diagnosis not present

## 2021-10-21 DIAGNOSIS — N186 End stage renal disease: Secondary | ICD-10-CM | POA: Diagnosis not present

## 2021-10-21 DIAGNOSIS — Z7984 Long term (current) use of oral hypoglycemic drugs: Secondary | ICD-10-CM | POA: Diagnosis not present

## 2021-10-21 DIAGNOSIS — Z7901 Long term (current) use of anticoagulants: Secondary | ICD-10-CM | POA: Diagnosis not present

## 2021-10-21 DIAGNOSIS — D6869 Other thrombophilia: Secondary | ICD-10-CM | POA: Diagnosis not present

## 2021-10-21 DIAGNOSIS — E785 Hyperlipidemia, unspecified: Secondary | ICD-10-CM | POA: Diagnosis not present

## 2021-10-21 DIAGNOSIS — Z6830 Body mass index (BMI) 30.0-30.9, adult: Secondary | ICD-10-CM | POA: Diagnosis not present

## 2021-10-21 DIAGNOSIS — Z992 Dependence on renal dialysis: Secondary | ICD-10-CM | POA: Diagnosis not present

## 2021-10-21 DIAGNOSIS — I110 Hypertensive heart disease with heart failure: Secondary | ICD-10-CM | POA: Diagnosis not present

## 2021-10-21 DIAGNOSIS — E669 Obesity, unspecified: Secondary | ICD-10-CM | POA: Diagnosis not present

## 2021-10-21 DIAGNOSIS — E1122 Type 2 diabetes mellitus with diabetic chronic kidney disease: Secondary | ICD-10-CM | POA: Diagnosis not present

## 2021-10-21 DIAGNOSIS — I4891 Unspecified atrial fibrillation: Secondary | ICD-10-CM | POA: Diagnosis not present

## 2021-10-22 DIAGNOSIS — Z992 Dependence on renal dialysis: Secondary | ICD-10-CM | POA: Diagnosis not present

## 2021-10-22 DIAGNOSIS — E876 Hypokalemia: Secondary | ICD-10-CM | POA: Diagnosis not present

## 2021-10-22 DIAGNOSIS — D509 Iron deficiency anemia, unspecified: Secondary | ICD-10-CM | POA: Diagnosis not present

## 2021-10-22 DIAGNOSIS — N2581 Secondary hyperparathyroidism of renal origin: Secondary | ICD-10-CM | POA: Diagnosis not present

## 2021-10-22 DIAGNOSIS — N186 End stage renal disease: Secondary | ICD-10-CM | POA: Diagnosis not present

## 2021-10-22 DIAGNOSIS — D689 Coagulation defect, unspecified: Secondary | ICD-10-CM | POA: Diagnosis not present

## 2021-10-23 DIAGNOSIS — Z992 Dependence on renal dialysis: Secondary | ICD-10-CM | POA: Diagnosis not present

## 2021-10-23 DIAGNOSIS — I4892 Unspecified atrial flutter: Secondary | ICD-10-CM | POA: Diagnosis not present

## 2021-10-23 DIAGNOSIS — I150 Renovascular hypertension: Secondary | ICD-10-CM | POA: Diagnosis not present

## 2021-10-23 DIAGNOSIS — N186 End stage renal disease: Secondary | ICD-10-CM | POA: Diagnosis not present

## 2021-10-23 DIAGNOSIS — E1101 Type 2 diabetes mellitus with hyperosmolarity with coma: Secondary | ICD-10-CM | POA: Diagnosis not present

## 2021-10-24 DIAGNOSIS — N186 End stage renal disease: Secondary | ICD-10-CM | POA: Diagnosis not present

## 2021-10-24 DIAGNOSIS — Z992 Dependence on renal dialysis: Secondary | ICD-10-CM | POA: Diagnosis not present

## 2021-10-24 DIAGNOSIS — D689 Coagulation defect, unspecified: Secondary | ICD-10-CM | POA: Diagnosis not present

## 2021-10-24 DIAGNOSIS — D509 Iron deficiency anemia, unspecified: Secondary | ICD-10-CM | POA: Diagnosis not present

## 2021-10-24 DIAGNOSIS — N2581 Secondary hyperparathyroidism of renal origin: Secondary | ICD-10-CM | POA: Diagnosis not present

## 2021-10-27 DIAGNOSIS — Z992 Dependence on renal dialysis: Secondary | ICD-10-CM | POA: Diagnosis not present

## 2021-10-27 DIAGNOSIS — D509 Iron deficiency anemia, unspecified: Secondary | ICD-10-CM | POA: Diagnosis not present

## 2021-10-27 DIAGNOSIS — D689 Coagulation defect, unspecified: Secondary | ICD-10-CM | POA: Diagnosis not present

## 2021-10-27 DIAGNOSIS — N2581 Secondary hyperparathyroidism of renal origin: Secondary | ICD-10-CM | POA: Diagnosis not present

## 2021-10-27 DIAGNOSIS — N186 End stage renal disease: Secondary | ICD-10-CM | POA: Diagnosis not present

## 2021-10-29 DIAGNOSIS — Z992 Dependence on renal dialysis: Secondary | ICD-10-CM | POA: Diagnosis not present

## 2021-10-29 DIAGNOSIS — D689 Coagulation defect, unspecified: Secondary | ICD-10-CM | POA: Diagnosis not present

## 2021-10-29 DIAGNOSIS — N2581 Secondary hyperparathyroidism of renal origin: Secondary | ICD-10-CM | POA: Diagnosis not present

## 2021-10-29 DIAGNOSIS — D509 Iron deficiency anemia, unspecified: Secondary | ICD-10-CM | POA: Diagnosis not present

## 2021-10-29 DIAGNOSIS — N186 End stage renal disease: Secondary | ICD-10-CM | POA: Diagnosis not present

## 2021-10-31 DIAGNOSIS — N2581 Secondary hyperparathyroidism of renal origin: Secondary | ICD-10-CM | POA: Diagnosis not present

## 2021-10-31 DIAGNOSIS — D509 Iron deficiency anemia, unspecified: Secondary | ICD-10-CM | POA: Diagnosis not present

## 2021-10-31 DIAGNOSIS — N186 End stage renal disease: Secondary | ICD-10-CM | POA: Diagnosis not present

## 2021-10-31 DIAGNOSIS — D689 Coagulation defect, unspecified: Secondary | ICD-10-CM | POA: Diagnosis not present

## 2021-10-31 DIAGNOSIS — Z992 Dependence on renal dialysis: Secondary | ICD-10-CM | POA: Diagnosis not present

## 2021-11-03 DIAGNOSIS — D689 Coagulation defect, unspecified: Secondary | ICD-10-CM | POA: Diagnosis not present

## 2021-11-03 DIAGNOSIS — N2581 Secondary hyperparathyroidism of renal origin: Secondary | ICD-10-CM | POA: Diagnosis not present

## 2021-11-03 DIAGNOSIS — D631 Anemia in chronic kidney disease: Secondary | ICD-10-CM | POA: Diagnosis not present

## 2021-11-03 DIAGNOSIS — D509 Iron deficiency anemia, unspecified: Secondary | ICD-10-CM | POA: Diagnosis not present

## 2021-11-03 DIAGNOSIS — N186 End stage renal disease: Secondary | ICD-10-CM | POA: Diagnosis not present

## 2021-11-03 DIAGNOSIS — Z992 Dependence on renal dialysis: Secondary | ICD-10-CM | POA: Diagnosis not present

## 2021-11-05 DIAGNOSIS — N2581 Secondary hyperparathyroidism of renal origin: Secondary | ICD-10-CM | POA: Diagnosis not present

## 2021-11-05 DIAGNOSIS — Z992 Dependence on renal dialysis: Secondary | ICD-10-CM | POA: Diagnosis not present

## 2021-11-05 DIAGNOSIS — D689 Coagulation defect, unspecified: Secondary | ICD-10-CM | POA: Diagnosis not present

## 2021-11-05 DIAGNOSIS — N186 End stage renal disease: Secondary | ICD-10-CM | POA: Diagnosis not present

## 2021-11-05 DIAGNOSIS — D631 Anemia in chronic kidney disease: Secondary | ICD-10-CM | POA: Diagnosis not present

## 2021-11-05 DIAGNOSIS — D509 Iron deficiency anemia, unspecified: Secondary | ICD-10-CM | POA: Diagnosis not present

## 2021-11-05 NOTE — Progress Notes (Deleted)
? ?Referring Provider: Dr. Hilma Favors  ?Primary Care Physician:  Sharilyn Sites, MD ?Primary Gastroenterologist:  Dr. Marland Kitchen ? ?No chief complaint on file. ? ? ?HPI:   ?Kyle Hicks is a 81 y.o. male presenting today at the request of Dr. Hilma Favors for elevated liver enzymes. ? ?GI history significant for prior GI bleed in 2018 with EGD revealing portal hypertensive gastropathy and 1 nonbleeding cratered duodenal ulcer.  CT at that time with questionable liver nodularity. Follow-up ultrasound with elastography with normal-appearing liver parenchyma, metavir fibrosis F3/F4.  Also with markedly abnormal gallbladder that was markedly distended with extensive stones and debris.  Stated given complex appearance, unable to rule out gallbladder wall mass with recommendations for surgical consultation.  Recommended additional serologic work-up and referral to general surgery, but patient declined against advice.  We have not seen patient since. ? ?Also history of CHF, EF 40-45% August 2022, paroxysmal atrial flutter on Eliquis, ESRD on dialysis, diabetes. ? ?Today:  ? ? ?Past Medical History:  ?Diagnosis Date  ? Chronic kidney disease   ? Diabetes mellitus without complication ([facility])   ? Dialysis patient [facility])   ? Diastolic CHF, acute ([place]) 10/08/2020  ? HTN (hypertension)   ? ? ?Past Surgical History:  ?Procedure Laterality Date  ? AV FISTULA PLACEMENT Left 01/22/2021  ? Procedure: LEFT UPPER ARM ARTERIOVENOUS GORE-TEX GRAFT;  Surgeon: [name], MD;  Location: [facility] ORS;  Service: Vascular;  Laterality: Left;  ? CARDIOVERSION N/A 10/10/2020  ? Procedure: CARDIOVERSION;  Surgeon: Lelon Perla, MD;  Location: [facility] ENDOSCOPY;  Service: Cardiovascular;  Laterality: N/A;  ? ESOPHAGOGASTRODUODENOSCOPY N/A 04/27/2017  ? Procedure: ESOPHAGOGASTRODUODENOSCOPY (EGD);  Surgeon: Rogene Houston, MD;  Location[facility] ENDO [facility];  Service: Endoscopy;  Laterality: N/A;  ? IR FLUORO GUIDE CV LINE RIGHT  10/23/2020  ? IR US GUIDANCE  10/23/2020  ?  RIGHT HEART CATH N/A 10/16/2020  ? Procedure: RIGHT HEART CATH;  Surgeon: Jolaine Artist, MD;  Location: [place] CV LAB;  Service: Cardiovascular;  Laterality: N/A;  ? RIGHT HEART CATH AND CORONARY ANGIOGRAPHY N/A 10/24/2020  ? Procedure: RIGHT HEART CATH AND CORONARY ANGIOGRAPHY;  Surgeon: Jolaine Artist, MD;  Location: [place] CV LAB;  Service: Cardiovascular;  Laterality: N/A;  ? TEE WITHOUT CARDIOVERSION N/A 10/10/2020  ? Procedure: TRANSESOPHAGEAL ECHOCARDIOGRAM (TEE);  Surgeon: Lelon Perla, MD;  Location: [place];  Service: Cardiovascular;  Laterality: N/A;  ? TEMPORARY DIALYSIS CATHETER  10/16/2020  ? Procedure: TEMPORARY DIALYSIS CATHETER;  Surgeon: Jolaine Artist, MD;  Location: [place] CV LAB;  Service: Cardiovascular;;  ? ? ?Current Outpatient Medications  ?Medication Sig Dispense Refill  ? apixaban (ELIQUIS) 5 MG TABS tablet Take 1 tablet (5 mg total) by mouth 2 (two) times daily. 60 tablet 11  ? atorvastatin (LIPITOR) 20 MG tablet Take 1 tablet (20 mg total) by mouth daily. 30 tablet 11  ? blood glucose meter kit and supplies KIT Dispense based on patient and insurance preference. Use up to 2 times daily as directed. (FOR ICD-10 E11.65) 1 each 5  ? Blood Glucose Monitoring Suppl ([place]) w/Device KIT Use to test blood glucose twice daily. Dx Code: E11.65 1 kit 1  ? carvedilol (COREG) 3.125 MG tablet Take 1 tablet (3.125 mg total) by mouth 2 (two) times daily. DO NOT take on the mornings of dialysis 60 tablet 11  ? glipiZIDE (GLUCOTROL) 5 MG tablet Take 1 tablet by mouth once daily with breakfast 90 tablet 0  ?  glucose blood (ONETOUCH VERIO) test strip Use as instructed to check blood glucose twice daily. Dx Code: E11.65 200 each 0  ? hydrocortisone cream 1 % Apply 1 application topically 3 (three) times daily as needed for itching (minor skin irritation). 30 g 0  ? [name] CARBONATE PO 500 mg. Patient is taking 500 mg tablet (chewable)3 times daily.     ? OneTouch Delica Lancets 01U MISC Use to check blood glucose twice daily. Dx Code: E11.65 100 each 1  ? oxyCODONE-acetaminophen (PERCOCET) 5-325 MG tablet Take 1 tablet by mouth every 6 (six) hours as needed for severe pain. (Patient not taking: Reported on 08/14/2021) 8 tablet 0  ? PACERONE 100 MG tablet Take 1 tablet by mouth once daily 30 tablet 11  ? Polyethyl Glycol-Propyl Glycol (SYSTANE) 0.4-0.3 % SOLN Place 1 drop into both eyes daily as needed (Eye irritation).    ? torsemide (DEMADEX) 100 MG tablet Take 1 tablet (100 mg total) by mouth daily. 30 tablet 11  ? ?No current facility-administered medications for this visit.  ? ? ?Allergies as of 11/06/2021  ? (No Known Allergies)  ? ? ?Family History  ?Problem Relation Age of Onset  ? Hypertension Mother   ? Hyperlipidemia Mother   ? Hypertension Father   ? Hyperlipidemia Father   ? Colon cancer Neg Hx   ? ? ?Social History  ? ?Socioeconomic History  ? Marital status: Married  ?  Spouse name: Not on file  ? Number of children: Not on file  ? Years of education: Not on file  ? Highest education level: Not on file  ?Occupational History  ? Not on file  ?Tobacco Use  ? Smoking status: Former  ?  Packs/day: 0.50  ?  Types: Cigarettes  ?  Quit date: 07/26/1987  ?  Years since quitting: 34.3  ? Smokeless tobacco: Never  ?Vaping Use  ? Vaping Use: Never used  ?Substance and Sexual Activity  ? Alcohol use: No  ?  Alcohol/week: 0.0 standard drinks  ?  Comment: Previously drank socially, last drank 25-30 years ago  ? Drug use: No  ? Sexual activity: Not on file  ?Other Topics Concern  ? Not on file  ?Social History Narrative  ? Not on file  ? ?Social Determinants of Health  ? ?Financial Resource Strain: Not on file  ?Food Insecurity: No Food Insecurity  ? Worried About [place] fund[place] in the Last Year: Never true  ? Ran Out of Food in the Last Year: Never true  ?Transportation Needs: No Transportation Needs  ? Lack of Transportation (Medical): No  ? Lack of  Transportation (Non-Medical): No  ?Physical Activity: Not on file  ?Stress: Not on file  ?Social Connections: Not on file  ?Intimate Partner Violence: Not on file  ? ? ?Review of Systems: ?Gen: Denies any fever, chills, cold or flulike symptoms, presyncope, syncope. ?CV: Denies chest pain, heart palpitations. ?Resp: Denies shortness of breath or cough. ?GI: See HPI ?GU : Denies urinary burning, urinary frequency, urinary hesitancy ?MS: Denies joint pain ?Derm: Denies rash ?Psych: Denies depression, anxiety ?Heme: See HPI ? ?Physical Exam: ?There were no vitals taken for this visit. ?General:   Alert and oriented. Pleasant and cooperative. Well-nourished and well-developed.  ?Head:  Normocephalic and atraumatic. ?Eyes:  Without icterus, sclera clear and conjunctiva pink.  ?Ears:  Normal auditory acuity. ?Lungs:  Clear to auscultation bilaterally. No wheezes, rales, or rhonchi. No distress.  ?Heart:  S1, S2 present without murmurs  appreciated.  ?Abdomen:  +BS, soft, non-tender and non-distended. No HSM noted. No guarding or rebound. No masses appreciated.  ?Rectal:  Deferred  ?Msk:  Symmetrical without gross deformities. Normal posture. ?Extremities:  Without edema. ?Neurologic:  Alert and  oriented x4;  grossly normal neurologically. ?Skin:  Intact without significant lesions or rashes. ?Psych:  Normal mood and affect. ? ? ? ?Assessment:  ? ? ? ?Plan:  ?*** ? ? ?Aliene Altes, PA-C ?[facility] ?11/06/2021 ?  ?

## 2021-11-06 ENCOUNTER — Encounter: Payer: Self-pay | Admitting: Internal Medicine

## 2021-11-06 ENCOUNTER — Ambulatory Visit: Payer: Medicare HMO | Admitting: Gastroenterology

## 2021-11-07 DIAGNOSIS — N186 End stage renal disease: Secondary | ICD-10-CM | POA: Diagnosis not present

## 2021-11-07 DIAGNOSIS — D631 Anemia in chronic kidney disease: Secondary | ICD-10-CM | POA: Diagnosis not present

## 2021-11-07 DIAGNOSIS — Z992 Dependence on renal dialysis: Secondary | ICD-10-CM | POA: Diagnosis not present

## 2021-11-07 DIAGNOSIS — N2581 Secondary hyperparathyroidism of renal origin: Secondary | ICD-10-CM | POA: Diagnosis not present

## 2021-11-07 DIAGNOSIS — D689 Coagulation defect, unspecified: Secondary | ICD-10-CM | POA: Diagnosis not present

## 2021-11-07 DIAGNOSIS — D509 Iron deficiency anemia, unspecified: Secondary | ICD-10-CM | POA: Diagnosis not present

## 2021-11-10 DIAGNOSIS — D689 Coagulation defect, unspecified: Secondary | ICD-10-CM | POA: Diagnosis not present

## 2021-11-10 DIAGNOSIS — E119 Type 2 diabetes mellitus without complications: Secondary | ICD-10-CM | POA: Diagnosis not present

## 2021-11-10 DIAGNOSIS — N186 End stage renal disease: Secondary | ICD-10-CM | POA: Diagnosis not present

## 2021-11-10 DIAGNOSIS — N2581 Secondary hyperparathyroidism of renal origin: Secondary | ICD-10-CM | POA: Diagnosis not present

## 2021-11-10 DIAGNOSIS — E039 Hypothyroidism, unspecified: Secondary | ICD-10-CM | POA: Diagnosis not present

## 2021-11-10 DIAGNOSIS — Z992 Dependence on renal dialysis: Secondary | ICD-10-CM | POA: Diagnosis not present

## 2021-11-10 DIAGNOSIS — D509 Iron deficiency anemia, unspecified: Secondary | ICD-10-CM | POA: Diagnosis not present

## 2021-11-12 DIAGNOSIS — E039 Hypothyroidism, unspecified: Secondary | ICD-10-CM | POA: Diagnosis not present

## 2021-11-12 DIAGNOSIS — Z992 Dependence on renal dialysis: Secondary | ICD-10-CM | POA: Diagnosis not present

## 2021-11-12 DIAGNOSIS — E119 Type 2 diabetes mellitus without complications: Secondary | ICD-10-CM | POA: Diagnosis not present

## 2021-11-12 DIAGNOSIS — N2581 Secondary hyperparathyroidism of renal origin: Secondary | ICD-10-CM | POA: Diagnosis not present

## 2021-11-12 DIAGNOSIS — D689 Coagulation defect, unspecified: Secondary | ICD-10-CM | POA: Diagnosis not present

## 2021-11-12 DIAGNOSIS — D509 Iron deficiency anemia, unspecified: Secondary | ICD-10-CM | POA: Diagnosis not present

## 2021-11-12 DIAGNOSIS — N186 End stage renal disease: Secondary | ICD-10-CM | POA: Diagnosis not present

## 2021-11-14 DIAGNOSIS — E119 Type 2 diabetes mellitus without complications: Secondary | ICD-10-CM | POA: Diagnosis not present

## 2021-11-14 DIAGNOSIS — N186 End stage renal disease: Secondary | ICD-10-CM | POA: Diagnosis not present

## 2021-11-14 DIAGNOSIS — D689 Coagulation defect, unspecified: Secondary | ICD-10-CM | POA: Diagnosis not present

## 2021-11-14 DIAGNOSIS — N2581 Secondary hyperparathyroidism of renal origin: Secondary | ICD-10-CM | POA: Diagnosis not present

## 2021-11-14 DIAGNOSIS — D509 Iron deficiency anemia, unspecified: Secondary | ICD-10-CM | POA: Diagnosis not present

## 2021-11-14 DIAGNOSIS — E039 Hypothyroidism, unspecified: Secondary | ICD-10-CM | POA: Diagnosis not present

## 2021-11-14 DIAGNOSIS — Z992 Dependence on renal dialysis: Secondary | ICD-10-CM | POA: Diagnosis not present

## 2021-11-17 DIAGNOSIS — Z992 Dependence on renal dialysis: Secondary | ICD-10-CM | POA: Diagnosis not present

## 2021-11-17 DIAGNOSIS — D509 Iron deficiency anemia, unspecified: Secondary | ICD-10-CM | POA: Diagnosis not present

## 2021-11-17 DIAGNOSIS — N2581 Secondary hyperparathyroidism of renal origin: Secondary | ICD-10-CM | POA: Diagnosis not present

## 2021-11-17 DIAGNOSIS — D689 Coagulation defect, unspecified: Secondary | ICD-10-CM | POA: Diagnosis not present

## 2021-11-17 DIAGNOSIS — N186 End stage renal disease: Secondary | ICD-10-CM | POA: Diagnosis not present

## 2021-11-19 DIAGNOSIS — N186 End stage renal disease: Secondary | ICD-10-CM | POA: Diagnosis not present

## 2021-11-19 DIAGNOSIS — N2581 Secondary hyperparathyroidism of renal origin: Secondary | ICD-10-CM | POA: Diagnosis not present

## 2021-11-19 DIAGNOSIS — D689 Coagulation defect, unspecified: Secondary | ICD-10-CM | POA: Diagnosis not present

## 2021-11-19 DIAGNOSIS — Z992 Dependence on renal dialysis: Secondary | ICD-10-CM | POA: Diagnosis not present

## 2021-11-19 DIAGNOSIS — D509 Iron deficiency anemia, unspecified: Secondary | ICD-10-CM | POA: Diagnosis not present

## 2021-11-21 DIAGNOSIS — D689 Coagulation defect, unspecified: Secondary | ICD-10-CM | POA: Diagnosis not present

## 2021-11-21 DIAGNOSIS — N2581 Secondary hyperparathyroidism of renal origin: Secondary | ICD-10-CM | POA: Diagnosis not present

## 2021-11-21 DIAGNOSIS — Z992 Dependence on renal dialysis: Secondary | ICD-10-CM | POA: Diagnosis not present

## 2021-11-21 DIAGNOSIS — N186 End stage renal disease: Secondary | ICD-10-CM | POA: Diagnosis not present

## 2021-11-21 DIAGNOSIS — D509 Iron deficiency anemia, unspecified: Secondary | ICD-10-CM | POA: Diagnosis not present

## 2021-11-22 DIAGNOSIS — N186 End stage renal disease: Secondary | ICD-10-CM | POA: Diagnosis not present

## 2021-11-22 DIAGNOSIS — I150 Renovascular hypertension: Secondary | ICD-10-CM | POA: Diagnosis not present

## 2021-11-22 DIAGNOSIS — Z992 Dependence on renal dialysis: Secondary | ICD-10-CM | POA: Diagnosis not present

## 2021-11-24 DIAGNOSIS — Z992 Dependence on renal dialysis: Secondary | ICD-10-CM | POA: Diagnosis not present

## 2021-11-24 DIAGNOSIS — N186 End stage renal disease: Secondary | ICD-10-CM | POA: Diagnosis not present

## 2021-11-24 DIAGNOSIS — D509 Iron deficiency anemia, unspecified: Secondary | ICD-10-CM | POA: Diagnosis not present

## 2021-11-24 DIAGNOSIS — N2581 Secondary hyperparathyroidism of renal origin: Secondary | ICD-10-CM | POA: Diagnosis not present

## 2021-11-24 DIAGNOSIS — D689 Coagulation defect, unspecified: Secondary | ICD-10-CM | POA: Diagnosis not present

## 2021-11-26 DIAGNOSIS — D509 Iron deficiency anemia, unspecified: Secondary | ICD-10-CM | POA: Diagnosis not present

## 2021-11-26 DIAGNOSIS — Z992 Dependence on renal dialysis: Secondary | ICD-10-CM | POA: Diagnosis not present

## 2021-11-26 DIAGNOSIS — N186 End stage renal disease: Secondary | ICD-10-CM | POA: Diagnosis not present

## 2021-11-26 DIAGNOSIS — D689 Coagulation defect, unspecified: Secondary | ICD-10-CM | POA: Diagnosis not present

## 2021-11-26 DIAGNOSIS — N2581 Secondary hyperparathyroidism of renal origin: Secondary | ICD-10-CM | POA: Diagnosis not present

## 2021-11-28 DIAGNOSIS — N186 End stage renal disease: Secondary | ICD-10-CM | POA: Diagnosis not present

## 2021-11-28 DIAGNOSIS — N2581 Secondary hyperparathyroidism of renal origin: Secondary | ICD-10-CM | POA: Diagnosis not present

## 2021-11-28 DIAGNOSIS — D509 Iron deficiency anemia, unspecified: Secondary | ICD-10-CM | POA: Diagnosis not present

## 2021-11-28 DIAGNOSIS — D689 Coagulation defect, unspecified: Secondary | ICD-10-CM | POA: Diagnosis not present

## 2021-11-28 DIAGNOSIS — Z992 Dependence on renal dialysis: Secondary | ICD-10-CM | POA: Diagnosis not present

## 2021-12-01 DIAGNOSIS — D689 Coagulation defect, unspecified: Secondary | ICD-10-CM | POA: Diagnosis not present

## 2021-12-01 DIAGNOSIS — D509 Iron deficiency anemia, unspecified: Secondary | ICD-10-CM | POA: Diagnosis not present

## 2021-12-01 DIAGNOSIS — N186 End stage renal disease: Secondary | ICD-10-CM | POA: Diagnosis not present

## 2021-12-01 DIAGNOSIS — Z992 Dependence on renal dialysis: Secondary | ICD-10-CM | POA: Diagnosis not present

## 2021-12-01 DIAGNOSIS — N2581 Secondary hyperparathyroidism of renal origin: Secondary | ICD-10-CM | POA: Diagnosis not present

## 2021-12-03 DIAGNOSIS — Z992 Dependence on renal dialysis: Secondary | ICD-10-CM | POA: Diagnosis not present

## 2021-12-03 DIAGNOSIS — D509 Iron deficiency anemia, unspecified: Secondary | ICD-10-CM | POA: Diagnosis not present

## 2021-12-03 DIAGNOSIS — N2581 Secondary hyperparathyroidism of renal origin: Secondary | ICD-10-CM | POA: Diagnosis not present

## 2021-12-03 DIAGNOSIS — D689 Coagulation defect, unspecified: Secondary | ICD-10-CM | POA: Diagnosis not present

## 2021-12-03 DIAGNOSIS — N186 End stage renal disease: Secondary | ICD-10-CM | POA: Diagnosis not present

## 2021-12-05 DIAGNOSIS — N186 End stage renal disease: Secondary | ICD-10-CM | POA: Diagnosis not present

## 2021-12-05 DIAGNOSIS — D509 Iron deficiency anemia, unspecified: Secondary | ICD-10-CM | POA: Diagnosis not present

## 2021-12-05 DIAGNOSIS — Z992 Dependence on renal dialysis: Secondary | ICD-10-CM | POA: Diagnosis not present

## 2021-12-05 DIAGNOSIS — N2581 Secondary hyperparathyroidism of renal origin: Secondary | ICD-10-CM | POA: Diagnosis not present

## 2021-12-05 DIAGNOSIS — D689 Coagulation defect, unspecified: Secondary | ICD-10-CM | POA: Diagnosis not present

## 2021-12-08 DIAGNOSIS — D631 Anemia in chronic kidney disease: Secondary | ICD-10-CM | POA: Diagnosis not present

## 2021-12-08 DIAGNOSIS — D509 Iron deficiency anemia, unspecified: Secondary | ICD-10-CM | POA: Diagnosis not present

## 2021-12-08 DIAGNOSIS — Z992 Dependence on renal dialysis: Secondary | ICD-10-CM | POA: Diagnosis not present

## 2021-12-08 DIAGNOSIS — D689 Coagulation defect, unspecified: Secondary | ICD-10-CM | POA: Diagnosis not present

## 2021-12-08 DIAGNOSIS — N186 End stage renal disease: Secondary | ICD-10-CM | POA: Diagnosis not present

## 2021-12-08 DIAGNOSIS — E119 Type 2 diabetes mellitus without complications: Secondary | ICD-10-CM | POA: Diagnosis not present

## 2021-12-08 DIAGNOSIS — N2581 Secondary hyperparathyroidism of renal origin: Secondary | ICD-10-CM | POA: Diagnosis not present

## 2021-12-10 DIAGNOSIS — D631 Anemia in chronic kidney disease: Secondary | ICD-10-CM | POA: Diagnosis not present

## 2021-12-10 DIAGNOSIS — N2581 Secondary hyperparathyroidism of renal origin: Secondary | ICD-10-CM | POA: Diagnosis not present

## 2021-12-10 DIAGNOSIS — Z992 Dependence on renal dialysis: Secondary | ICD-10-CM | POA: Diagnosis not present

## 2021-12-10 DIAGNOSIS — N186 End stage renal disease: Secondary | ICD-10-CM | POA: Diagnosis not present

## 2021-12-10 DIAGNOSIS — E119 Type 2 diabetes mellitus without complications: Secondary | ICD-10-CM | POA: Diagnosis not present

## 2021-12-10 DIAGNOSIS — D509 Iron deficiency anemia, unspecified: Secondary | ICD-10-CM | POA: Diagnosis not present

## 2021-12-10 DIAGNOSIS — D689 Coagulation defect, unspecified: Secondary | ICD-10-CM | POA: Diagnosis not present

## 2021-12-11 DIAGNOSIS — N186 End stage renal disease: Secondary | ICD-10-CM | POA: Diagnosis not present

## 2021-12-11 DIAGNOSIS — Z992 Dependence on renal dialysis: Secondary | ICD-10-CM | POA: Diagnosis not present

## 2021-12-11 DIAGNOSIS — T82858A Stenosis of vascular prosthetic devices, implants and grafts, initial encounter: Secondary | ICD-10-CM | POA: Diagnosis not present

## 2021-12-11 DIAGNOSIS — I871 Compression of vein: Secondary | ICD-10-CM | POA: Diagnosis not present

## 2021-12-12 DIAGNOSIS — D689 Coagulation defect, unspecified: Secondary | ICD-10-CM | POA: Diagnosis not present

## 2021-12-12 DIAGNOSIS — D509 Iron deficiency anemia, unspecified: Secondary | ICD-10-CM | POA: Diagnosis not present

## 2021-12-12 DIAGNOSIS — E119 Type 2 diabetes mellitus without complications: Secondary | ICD-10-CM | POA: Diagnosis not present

## 2021-12-12 DIAGNOSIS — Z992 Dependence on renal dialysis: Secondary | ICD-10-CM | POA: Diagnosis not present

## 2021-12-12 DIAGNOSIS — N2581 Secondary hyperparathyroidism of renal origin: Secondary | ICD-10-CM | POA: Diagnosis not present

## 2021-12-12 DIAGNOSIS — D631 Anemia in chronic kidney disease: Secondary | ICD-10-CM | POA: Diagnosis not present

## 2021-12-12 DIAGNOSIS — N186 End stage renal disease: Secondary | ICD-10-CM | POA: Diagnosis not present

## 2021-12-15 DIAGNOSIS — Z992 Dependence on renal dialysis: Secondary | ICD-10-CM | POA: Diagnosis not present

## 2021-12-15 DIAGNOSIS — N186 End stage renal disease: Secondary | ICD-10-CM | POA: Diagnosis not present

## 2021-12-15 DIAGNOSIS — N2581 Secondary hyperparathyroidism of renal origin: Secondary | ICD-10-CM | POA: Diagnosis not present

## 2021-12-15 DIAGNOSIS — D689 Coagulation defect, unspecified: Secondary | ICD-10-CM | POA: Diagnosis not present

## 2021-12-17 DIAGNOSIS — N2581 Secondary hyperparathyroidism of renal origin: Secondary | ICD-10-CM | POA: Diagnosis not present

## 2021-12-17 DIAGNOSIS — D689 Coagulation defect, unspecified: Secondary | ICD-10-CM | POA: Diagnosis not present

## 2021-12-17 DIAGNOSIS — N186 End stage renal disease: Secondary | ICD-10-CM | POA: Diagnosis not present

## 2021-12-17 DIAGNOSIS — Z992 Dependence on renal dialysis: Secondary | ICD-10-CM | POA: Diagnosis not present

## 2021-12-19 DIAGNOSIS — N2581 Secondary hyperparathyroidism of renal origin: Secondary | ICD-10-CM | POA: Diagnosis not present

## 2021-12-19 DIAGNOSIS — D689 Coagulation defect, unspecified: Secondary | ICD-10-CM | POA: Diagnosis not present

## 2021-12-19 DIAGNOSIS — N186 End stage renal disease: Secondary | ICD-10-CM | POA: Diagnosis not present

## 2021-12-19 DIAGNOSIS — Z992 Dependence on renal dialysis: Secondary | ICD-10-CM | POA: Diagnosis not present

## 2021-12-22 DIAGNOSIS — D689 Coagulation defect, unspecified: Secondary | ICD-10-CM | POA: Diagnosis not present

## 2021-12-22 DIAGNOSIS — N2581 Secondary hyperparathyroidism of renal origin: Secondary | ICD-10-CM | POA: Diagnosis not present

## 2021-12-22 DIAGNOSIS — D509 Iron deficiency anemia, unspecified: Secondary | ICD-10-CM | POA: Diagnosis not present

## 2021-12-22 DIAGNOSIS — N186 End stage renal disease: Secondary | ICD-10-CM | POA: Diagnosis not present

## 2021-12-22 DIAGNOSIS — Z992 Dependence on renal dialysis: Secondary | ICD-10-CM | POA: Diagnosis not present

## 2021-12-23 DIAGNOSIS — Z992 Dependence on renal dialysis: Secondary | ICD-10-CM | POA: Diagnosis not present

## 2021-12-23 DIAGNOSIS — I150 Renovascular hypertension: Secondary | ICD-10-CM | POA: Diagnosis not present

## 2021-12-23 DIAGNOSIS — N186 End stage renal disease: Secondary | ICD-10-CM | POA: Diagnosis not present

## 2021-12-24 DIAGNOSIS — N186 End stage renal disease: Secondary | ICD-10-CM | POA: Diagnosis not present

## 2021-12-24 DIAGNOSIS — D689 Coagulation defect, unspecified: Secondary | ICD-10-CM | POA: Diagnosis not present

## 2021-12-24 DIAGNOSIS — D631 Anemia in chronic kidney disease: Secondary | ICD-10-CM | POA: Diagnosis not present

## 2021-12-24 DIAGNOSIS — N2581 Secondary hyperparathyroidism of renal origin: Secondary | ICD-10-CM | POA: Diagnosis not present

## 2021-12-24 DIAGNOSIS — Z992 Dependence on renal dialysis: Secondary | ICD-10-CM | POA: Diagnosis not present

## 2021-12-26 DIAGNOSIS — D689 Coagulation defect, unspecified: Secondary | ICD-10-CM | POA: Diagnosis not present

## 2021-12-26 DIAGNOSIS — Z992 Dependence on renal dialysis: Secondary | ICD-10-CM | POA: Diagnosis not present

## 2021-12-26 DIAGNOSIS — N186 End stage renal disease: Secondary | ICD-10-CM | POA: Diagnosis not present

## 2021-12-26 DIAGNOSIS — D631 Anemia in chronic kidney disease: Secondary | ICD-10-CM | POA: Diagnosis not present

## 2021-12-26 DIAGNOSIS — N2581 Secondary hyperparathyroidism of renal origin: Secondary | ICD-10-CM | POA: Diagnosis not present

## 2021-12-28 ENCOUNTER — Other Ambulatory Visit: Payer: Self-pay | Admitting: "Endocrinology

## 2021-12-29 DIAGNOSIS — N186 End stage renal disease: Secondary | ICD-10-CM | POA: Diagnosis not present

## 2021-12-29 DIAGNOSIS — D689 Coagulation defect, unspecified: Secondary | ICD-10-CM | POA: Diagnosis not present

## 2021-12-29 DIAGNOSIS — Z992 Dependence on renal dialysis: Secondary | ICD-10-CM | POA: Diagnosis not present

## 2021-12-29 DIAGNOSIS — N2581 Secondary hyperparathyroidism of renal origin: Secondary | ICD-10-CM | POA: Diagnosis not present

## 2021-12-29 DIAGNOSIS — D509 Iron deficiency anemia, unspecified: Secondary | ICD-10-CM | POA: Diagnosis not present

## 2021-12-30 ENCOUNTER — Other Ambulatory Visit: Payer: Self-pay | Admitting: "Endocrinology

## 2021-12-31 DIAGNOSIS — D509 Iron deficiency anemia, unspecified: Secondary | ICD-10-CM | POA: Diagnosis not present

## 2021-12-31 DIAGNOSIS — N186 End stage renal disease: Secondary | ICD-10-CM | POA: Diagnosis not present

## 2021-12-31 DIAGNOSIS — D689 Coagulation defect, unspecified: Secondary | ICD-10-CM | POA: Diagnosis not present

## 2021-12-31 DIAGNOSIS — Z992 Dependence on renal dialysis: Secondary | ICD-10-CM | POA: Diagnosis not present

## 2021-12-31 DIAGNOSIS — N2581 Secondary hyperparathyroidism of renal origin: Secondary | ICD-10-CM | POA: Diagnosis not present

## 2022-01-02 DIAGNOSIS — D689 Coagulation defect, unspecified: Secondary | ICD-10-CM | POA: Diagnosis not present

## 2022-01-02 DIAGNOSIS — D509 Iron deficiency anemia, unspecified: Secondary | ICD-10-CM | POA: Diagnosis not present

## 2022-01-02 DIAGNOSIS — Z992 Dependence on renal dialysis: Secondary | ICD-10-CM | POA: Diagnosis not present

## 2022-01-02 DIAGNOSIS — N2581 Secondary hyperparathyroidism of renal origin: Secondary | ICD-10-CM | POA: Diagnosis not present

## 2022-01-02 DIAGNOSIS — N186 End stage renal disease: Secondary | ICD-10-CM | POA: Diagnosis not present

## 2022-01-05 DIAGNOSIS — Z992 Dependence on renal dialysis: Secondary | ICD-10-CM | POA: Diagnosis not present

## 2022-01-05 DIAGNOSIS — N186 End stage renal disease: Secondary | ICD-10-CM | POA: Diagnosis not present

## 2022-01-05 DIAGNOSIS — D509 Iron deficiency anemia, unspecified: Secondary | ICD-10-CM | POA: Diagnosis not present

## 2022-01-05 DIAGNOSIS — D689 Coagulation defect, unspecified: Secondary | ICD-10-CM | POA: Diagnosis not present

## 2022-01-05 DIAGNOSIS — N2581 Secondary hyperparathyroidism of renal origin: Secondary | ICD-10-CM | POA: Diagnosis not present

## 2022-01-07 DIAGNOSIS — N2581 Secondary hyperparathyroidism of renal origin: Secondary | ICD-10-CM | POA: Diagnosis not present

## 2022-01-07 DIAGNOSIS — N186 End stage renal disease: Secondary | ICD-10-CM | POA: Diagnosis not present

## 2022-01-07 DIAGNOSIS — D509 Iron deficiency anemia, unspecified: Secondary | ICD-10-CM | POA: Diagnosis not present

## 2022-01-07 DIAGNOSIS — D689 Coagulation defect, unspecified: Secondary | ICD-10-CM | POA: Diagnosis not present

## 2022-01-07 DIAGNOSIS — Z992 Dependence on renal dialysis: Secondary | ICD-10-CM | POA: Diagnosis not present

## 2022-01-09 DIAGNOSIS — Z992 Dependence on renal dialysis: Secondary | ICD-10-CM | POA: Diagnosis not present

## 2022-01-09 DIAGNOSIS — N2581 Secondary hyperparathyroidism of renal origin: Secondary | ICD-10-CM | POA: Diagnosis not present

## 2022-01-09 DIAGNOSIS — D689 Coagulation defect, unspecified: Secondary | ICD-10-CM | POA: Diagnosis not present

## 2022-01-09 DIAGNOSIS — N186 End stage renal disease: Secondary | ICD-10-CM | POA: Diagnosis not present

## 2022-01-09 DIAGNOSIS — D509 Iron deficiency anemia, unspecified: Secondary | ICD-10-CM | POA: Diagnosis not present

## 2022-01-12 DIAGNOSIS — N2581 Secondary hyperparathyroidism of renal origin: Secondary | ICD-10-CM | POA: Diagnosis not present

## 2022-01-12 DIAGNOSIS — N186 End stage renal disease: Secondary | ICD-10-CM | POA: Diagnosis not present

## 2022-01-12 DIAGNOSIS — E119 Type 2 diabetes mellitus without complications: Secondary | ICD-10-CM | POA: Diagnosis not present

## 2022-01-12 DIAGNOSIS — D509 Iron deficiency anemia, unspecified: Secondary | ICD-10-CM | POA: Diagnosis not present

## 2022-01-12 DIAGNOSIS — Z992 Dependence on renal dialysis: Secondary | ICD-10-CM | POA: Diagnosis not present

## 2022-01-12 DIAGNOSIS — D689 Coagulation defect, unspecified: Secondary | ICD-10-CM | POA: Diagnosis not present

## 2022-01-14 DIAGNOSIS — Z992 Dependence on renal dialysis: Secondary | ICD-10-CM | POA: Diagnosis not present

## 2022-01-14 DIAGNOSIS — N2581 Secondary hyperparathyroidism of renal origin: Secondary | ICD-10-CM | POA: Diagnosis not present

## 2022-01-14 DIAGNOSIS — D509 Iron deficiency anemia, unspecified: Secondary | ICD-10-CM | POA: Diagnosis not present

## 2022-01-14 DIAGNOSIS — D689 Coagulation defect, unspecified: Secondary | ICD-10-CM | POA: Diagnosis not present

## 2022-01-14 DIAGNOSIS — N186 End stage renal disease: Secondary | ICD-10-CM | POA: Diagnosis not present

## 2022-01-14 DIAGNOSIS — E119 Type 2 diabetes mellitus without complications: Secondary | ICD-10-CM | POA: Diagnosis not present

## 2022-01-16 DIAGNOSIS — N2581 Secondary hyperparathyroidism of renal origin: Secondary | ICD-10-CM | POA: Diagnosis not present

## 2022-01-16 DIAGNOSIS — Z992 Dependence on renal dialysis: Secondary | ICD-10-CM | POA: Diagnosis not present

## 2022-01-16 DIAGNOSIS — E119 Type 2 diabetes mellitus without complications: Secondary | ICD-10-CM | POA: Diagnosis not present

## 2022-01-16 DIAGNOSIS — D509 Iron deficiency anemia, unspecified: Secondary | ICD-10-CM | POA: Diagnosis not present

## 2022-01-16 DIAGNOSIS — D689 Coagulation defect, unspecified: Secondary | ICD-10-CM | POA: Diagnosis not present

## 2022-01-16 DIAGNOSIS — N186 End stage renal disease: Secondary | ICD-10-CM | POA: Diagnosis not present

## 2022-01-19 DIAGNOSIS — Z992 Dependence on renal dialysis: Secondary | ICD-10-CM | POA: Diagnosis not present

## 2022-01-19 DIAGNOSIS — D689 Coagulation defect, unspecified: Secondary | ICD-10-CM | POA: Diagnosis not present

## 2022-01-19 DIAGNOSIS — N186 End stage renal disease: Secondary | ICD-10-CM | POA: Diagnosis not present

## 2022-01-19 DIAGNOSIS — D631 Anemia in chronic kidney disease: Secondary | ICD-10-CM | POA: Diagnosis not present

## 2022-01-19 DIAGNOSIS — D509 Iron deficiency anemia, unspecified: Secondary | ICD-10-CM | POA: Diagnosis not present

## 2022-01-19 DIAGNOSIS — N2581 Secondary hyperparathyroidism of renal origin: Secondary | ICD-10-CM | POA: Diagnosis not present

## 2022-01-21 DIAGNOSIS — N2581 Secondary hyperparathyroidism of renal origin: Secondary | ICD-10-CM | POA: Diagnosis not present

## 2022-01-21 DIAGNOSIS — D631 Anemia in chronic kidney disease: Secondary | ICD-10-CM | POA: Diagnosis not present

## 2022-01-21 DIAGNOSIS — D689 Coagulation defect, unspecified: Secondary | ICD-10-CM | POA: Diagnosis not present

## 2022-01-21 DIAGNOSIS — D509 Iron deficiency anemia, unspecified: Secondary | ICD-10-CM | POA: Diagnosis not present

## 2022-01-21 DIAGNOSIS — Z992 Dependence on renal dialysis: Secondary | ICD-10-CM | POA: Diagnosis not present

## 2022-01-21 DIAGNOSIS — N186 End stage renal disease: Secondary | ICD-10-CM | POA: Diagnosis not present

## 2022-01-22 DIAGNOSIS — N186 End stage renal disease: Secondary | ICD-10-CM | POA: Diagnosis not present

## 2022-01-22 DIAGNOSIS — Z992 Dependence on renal dialysis: Secondary | ICD-10-CM | POA: Diagnosis not present

## 2022-01-22 DIAGNOSIS — I150 Renovascular hypertension: Secondary | ICD-10-CM | POA: Diagnosis not present

## 2022-01-23 DIAGNOSIS — N186 End stage renal disease: Secondary | ICD-10-CM | POA: Diagnosis not present

## 2022-01-23 DIAGNOSIS — D689 Coagulation defect, unspecified: Secondary | ICD-10-CM | POA: Diagnosis not present

## 2022-01-23 DIAGNOSIS — N2581 Secondary hyperparathyroidism of renal origin: Secondary | ICD-10-CM | POA: Diagnosis not present

## 2022-01-23 DIAGNOSIS — D631 Anemia in chronic kidney disease: Secondary | ICD-10-CM | POA: Diagnosis not present

## 2022-01-23 DIAGNOSIS — Z992 Dependence on renal dialysis: Secondary | ICD-10-CM | POA: Diagnosis not present

## 2022-01-26 DIAGNOSIS — N186 End stage renal disease: Secondary | ICD-10-CM | POA: Diagnosis not present

## 2022-01-26 DIAGNOSIS — D689 Coagulation defect, unspecified: Secondary | ICD-10-CM | POA: Diagnosis not present

## 2022-01-26 DIAGNOSIS — N2581 Secondary hyperparathyroidism of renal origin: Secondary | ICD-10-CM | POA: Diagnosis not present

## 2022-01-26 DIAGNOSIS — Z992 Dependence on renal dialysis: Secondary | ICD-10-CM | POA: Diagnosis not present

## 2022-01-26 DIAGNOSIS — D509 Iron deficiency anemia, unspecified: Secondary | ICD-10-CM | POA: Diagnosis not present

## 2022-01-28 DIAGNOSIS — Z992 Dependence on renal dialysis: Secondary | ICD-10-CM | POA: Diagnosis not present

## 2022-01-28 DIAGNOSIS — D509 Iron deficiency anemia, unspecified: Secondary | ICD-10-CM | POA: Diagnosis not present

## 2022-01-28 DIAGNOSIS — N186 End stage renal disease: Secondary | ICD-10-CM | POA: Diagnosis not present

## 2022-01-28 DIAGNOSIS — D689 Coagulation defect, unspecified: Secondary | ICD-10-CM | POA: Diagnosis not present

## 2022-01-28 DIAGNOSIS — N2581 Secondary hyperparathyroidism of renal origin: Secondary | ICD-10-CM | POA: Diagnosis not present

## 2022-01-30 DIAGNOSIS — D689 Coagulation defect, unspecified: Secondary | ICD-10-CM | POA: Diagnosis not present

## 2022-01-30 DIAGNOSIS — D509 Iron deficiency anemia, unspecified: Secondary | ICD-10-CM | POA: Diagnosis not present

## 2022-01-30 DIAGNOSIS — Z992 Dependence on renal dialysis: Secondary | ICD-10-CM | POA: Diagnosis not present

## 2022-01-30 DIAGNOSIS — N2581 Secondary hyperparathyroidism of renal origin: Secondary | ICD-10-CM | POA: Diagnosis not present

## 2022-01-30 DIAGNOSIS — N186 End stage renal disease: Secondary | ICD-10-CM | POA: Diagnosis not present

## 2022-02-02 DIAGNOSIS — Z992 Dependence on renal dialysis: Secondary | ICD-10-CM | POA: Diagnosis not present

## 2022-02-02 DIAGNOSIS — D689 Coagulation defect, unspecified: Secondary | ICD-10-CM | POA: Diagnosis not present

## 2022-02-02 DIAGNOSIS — N186 End stage renal disease: Secondary | ICD-10-CM | POA: Diagnosis not present

## 2022-02-02 DIAGNOSIS — D631 Anemia in chronic kidney disease: Secondary | ICD-10-CM | POA: Diagnosis not present

## 2022-02-02 DIAGNOSIS — D509 Iron deficiency anemia, unspecified: Secondary | ICD-10-CM | POA: Diagnosis not present

## 2022-02-02 DIAGNOSIS — N2581 Secondary hyperparathyroidism of renal origin: Secondary | ICD-10-CM | POA: Diagnosis not present

## 2022-02-04 DIAGNOSIS — N186 End stage renal disease: Secondary | ICD-10-CM | POA: Diagnosis not present

## 2022-02-04 DIAGNOSIS — D631 Anemia in chronic kidney disease: Secondary | ICD-10-CM | POA: Diagnosis not present

## 2022-02-04 DIAGNOSIS — N2581 Secondary hyperparathyroidism of renal origin: Secondary | ICD-10-CM | POA: Diagnosis not present

## 2022-02-04 DIAGNOSIS — Z992 Dependence on renal dialysis: Secondary | ICD-10-CM | POA: Diagnosis not present

## 2022-02-04 DIAGNOSIS — D509 Iron deficiency anemia, unspecified: Secondary | ICD-10-CM | POA: Diagnosis not present

## 2022-02-04 DIAGNOSIS — D689 Coagulation defect, unspecified: Secondary | ICD-10-CM | POA: Diagnosis not present

## 2022-02-06 DIAGNOSIS — D631 Anemia in chronic kidney disease: Secondary | ICD-10-CM | POA: Diagnosis not present

## 2022-02-06 DIAGNOSIS — N2581 Secondary hyperparathyroidism of renal origin: Secondary | ICD-10-CM | POA: Diagnosis not present

## 2022-02-06 DIAGNOSIS — D509 Iron deficiency anemia, unspecified: Secondary | ICD-10-CM | POA: Diagnosis not present

## 2022-02-06 DIAGNOSIS — Z992 Dependence on renal dialysis: Secondary | ICD-10-CM | POA: Diagnosis not present

## 2022-02-06 DIAGNOSIS — D689 Coagulation defect, unspecified: Secondary | ICD-10-CM | POA: Diagnosis not present

## 2022-02-06 DIAGNOSIS — N186 End stage renal disease: Secondary | ICD-10-CM | POA: Diagnosis not present

## 2022-02-09 DIAGNOSIS — D689 Coagulation defect, unspecified: Secondary | ICD-10-CM | POA: Diagnosis not present

## 2022-02-09 DIAGNOSIS — N2581 Secondary hyperparathyroidism of renal origin: Secondary | ICD-10-CM | POA: Diagnosis not present

## 2022-02-09 DIAGNOSIS — Z992 Dependence on renal dialysis: Secondary | ICD-10-CM | POA: Diagnosis not present

## 2022-02-09 DIAGNOSIS — D509 Iron deficiency anemia, unspecified: Secondary | ICD-10-CM | POA: Diagnosis not present

## 2022-02-09 DIAGNOSIS — E039 Hypothyroidism, unspecified: Secondary | ICD-10-CM | POA: Diagnosis not present

## 2022-02-09 DIAGNOSIS — N186 End stage renal disease: Secondary | ICD-10-CM | POA: Diagnosis not present

## 2022-02-09 DIAGNOSIS — E119 Type 2 diabetes mellitus without complications: Secondary | ICD-10-CM | POA: Diagnosis not present

## 2022-02-11 DIAGNOSIS — D509 Iron deficiency anemia, unspecified: Secondary | ICD-10-CM | POA: Diagnosis not present

## 2022-02-11 DIAGNOSIS — Z992 Dependence on renal dialysis: Secondary | ICD-10-CM | POA: Diagnosis not present

## 2022-02-11 DIAGNOSIS — E119 Type 2 diabetes mellitus without complications: Secondary | ICD-10-CM | POA: Diagnosis not present

## 2022-02-11 DIAGNOSIS — E039 Hypothyroidism, unspecified: Secondary | ICD-10-CM | POA: Diagnosis not present

## 2022-02-11 DIAGNOSIS — D689 Coagulation defect, unspecified: Secondary | ICD-10-CM | POA: Diagnosis not present

## 2022-02-11 DIAGNOSIS — N2581 Secondary hyperparathyroidism of renal origin: Secondary | ICD-10-CM | POA: Diagnosis not present

## 2022-02-11 DIAGNOSIS — N186 End stage renal disease: Secondary | ICD-10-CM | POA: Diagnosis not present

## 2022-02-12 ENCOUNTER — Encounter: Payer: Self-pay | Admitting: "Endocrinology

## 2022-02-12 ENCOUNTER — Ambulatory Visit (INDEPENDENT_AMBULATORY_CARE_PROVIDER_SITE_OTHER): Payer: Medicare HMO | Admitting: "Endocrinology

## 2022-02-12 VITALS — BP 84/46 | HR 76 | Ht 66.5 in | Wt 193.0 lb

## 2022-02-12 DIAGNOSIS — I10 Essential (primary) hypertension: Secondary | ICD-10-CM

## 2022-02-12 DIAGNOSIS — E782 Mixed hyperlipidemia: Secondary | ICD-10-CM

## 2022-02-12 DIAGNOSIS — E669 Obesity, unspecified: Secondary | ICD-10-CM | POA: Diagnosis not present

## 2022-02-12 DIAGNOSIS — N186 End stage renal disease: Secondary | ICD-10-CM | POA: Diagnosis not present

## 2022-02-12 DIAGNOSIS — E1122 Type 2 diabetes mellitus with diabetic chronic kidney disease: Secondary | ICD-10-CM | POA: Diagnosis not present

## 2022-02-12 LAB — POCT GLYCOSYLATED HEMOGLOBIN (HGB A1C): HbA1c, POC (controlled diabetic range): 6.1 % (ref 0.0–7.0)

## 2022-02-12 NOTE — Patient Instructions (Signed)

## 2022-02-12 NOTE — Progress Notes (Signed)
02/12/2022  Endocrinology follow-up note   Subjective:    Patient ID: Kyle Hicks, male    DOB: 1941-01-06, PCP Sharilyn Sites, MD   Past Medical History:  Diagnosis Date   Chronic kidney disease    Diabetes mellitus without complication [facility])    Dialysis patient [facility])    Diastolic CHF, acute ([place]) 10/08/2020   HTN (hypertension)    Past Surgical History:  Procedure Laterality Date   AV FISTULA PLACEMENT Left 01/22/2021   Procedure: LEFT UPPER ARM ARTERIOVENOUS GORE-TEX GRAFT;  Surgeon: [name], MD;  Location: [facility] ORS;  Service: Vascular;  Laterality: Left;   CARDIOVERSION N/A 10/10/2020   Procedure: CARDIOVERSION;  Surgeon: Lelon Perla, MD;  Location: [facility] ENDOSCOPY;  Service: Cardiovascular;  Laterality: N/A;   ESOPHAGOGASTRODUODENOSCOPY N/A 04/27/2017   Procedure: ESOPHAGOGASTRODUODENOSCOPY (EGD);  Surgeon: Rogene Houston, MD;  Location: [facility] ENDO SUITE;  Service: Endoscopy;  Laterality: N/A;   IR FLUORO GUIDE CV LINE RIGHT  10/23/2020   IR US GUIDANCE  10/23/2020   RIGHT HEART CATH N/A 10/16/2020   Procedure: RIGHT HEART CATH;  Surgeon: Jolaine Artist, MD;  Location: [place] CV LAB;  Service: Cardiovascular;  Laterality: N/A;   RIGHT HEART CATH AND CORONARY ANGIOGRAPHY N/A 10/24/2020   Procedure: RIGHT HEART CATH AND CORONARY ANGIOGRAPHY;  Surgeon: Jolaine Artist, MD;  Location: [place] CV LAB;  Service: Cardiovascular;  Laterality: N/A;   TEE WITHOUT CARDIOVERSION N/A 10/10/2020   Procedure: TRANSESOPHAGEAL ECHOCARDIOGRAM (TEE);  Surgeon: Lelon Perla, MD;  Location: [facility] ENDOSCOPY;  Service: Cardiovascular;  Laterality: N/A;   TEMPORARY DIALYSIS CATHETER  10/16/2020   Procedure: TEMPORARY DIALYSIS CATHETER;  Surgeon: Jolaine Artist, MD;  Location: [place] CV LAB;  Service: Cardiovascular;;   Social History   Socioeconomic History   Marital status: Married    Spouse name: Not on file   Number of children: Not on file   Years of education:  Not on file   Highest education level: Not on file  Occupational History   Not on file  Tobacco Use   Smoking status: Former    Packs/day: 0.50    Types: Cigarettes    Quit date: 07/26/1987    Years since quitting: 34.5   Smokeless tobacco: Never  Vaping Use   Vaping Use: Never used  Substance and Sexual Activity   Alcohol use: No    Alcohol/week: 0.0 standard drinks of alcohol    Comment: Previously drank socially, last drank 25-30 years ago   Drug use: No   Sexual activity: Not on file  Other Topics Concern   Not on file  Social History Narrative   Not on file   Social Determinants of Health   Financial Resource Strain: Not on file  Food Insecurity: No Food Insecurity (11/05/2020)   Hunger Vital Sign    Worried About Running Out of Food in the Last Year: Never true    Ran Out of Food in the Last Year: Never true  Transportation Needs: No Transportation Needs (11/05/2020)   PRAPARE - Hydrologist (Medical): No    Lack of Transportation (Non-Medical): No  Physical Activity: Not on file  Stress: Not on file  Social Connections: Not on file   Outpatient Encounter Medications as of 02/12/2022  Medication Sig   apixaban (ELIQUIS) 5 MG TABS tablet Take 1 tablet (5 mg total) by mouth 2 (two) times daily.   atorvastatin (LIPITOR) 20 MG tablet Take 1  tablet (20 mg total) by mouth daily.   blood glucose meter kit and supplies KIT Dispense based on patient and insurance preference. Use up to 2 times daily as directed. (FOR ICD-10 E11.65)   Blood Glucose Monitoring Suppl ([place]) w/Device KIT Use to test blood glucose twice daily. Dx Code: E11.65   carvedilol (COREG) 3.125 MG tablet Take 1 tablet (3.125 mg total) by mouth 2 (two) times daily. DO NOT take on the mornings of dialysis   glipiZIDE (GLUCOTROL) 5 MG tablet Take 1 tablet by mouth once daily with breakfast   glucose blood (ONETOUCH VERIO) test strip Use as instructed to check  blood glucose twice daily. Dx Code: E11.65   hydrocortisone cream 1 % Apply 1 application topically 3 (three) times daily as needed for itching (minor skin irritation).   [name] CARBONATE PO 500 mg. Patient is taking 500 mg tablet (chewable)3 times daily.   OneTouch Delica Lancets 64P MISC Use to check blood glucose twice daily. Dx Code: E11.65   oxyCODONE-acetaminophen (PERCOCET) 5-325 MG tablet Take 1 tablet by mouth every 6 (six) hours as needed for severe pain. (Patient not taking: Reported on 08/14/2021)   PACERONE 100 MG tablet Take 1 tablet by mouth once daily   Polyethyl Glycol-Propyl Glycol (SYSTANE) 0.4-0.3 % SOLN Place 1 drop into both eyes daily as needed (Eye irritation).   torsemide (DEMADEX) 100 MG tablet Take 1 tablet (100 mg total) by mouth daily.   No facility-administered encounter medications on file as of 02/12/2022.   ALLERGIES: No Known Allergies VACCINATION STATUS: Immunization History  Administered Date(s) Administered   Moderna Sars-Covid-2 Vaccination 09/23/2019, 10/27/2019    Diabetes He presents for his follow-up diabetic visit. He has type 2 diabetes mellitus. The initial diagnosis of diabetes was made 2 years ago. His disease course has been improving. There are no hypoglycemic associated symptoms. Pertinent negatives for hypoglycemia include no confusion, headaches, pallor or seizures. There are no diabetic associated symptoms. Pertinent negatives for diabetes include no chest pain, no fatigue, no polydipsia, no polyphagia, no polyuria and no weakness. There are no hypoglycemic complications. Symptoms are improving. Diabetic complications include nephropathy. Risk factors for coronary artery disease include diabetes mellitus, dyslipidemia, hypertension, male sex, obesity, sedentary lifestyle and tobacco exposure. He is compliant with treatment most of the time. His weight is decreasing steadily (He lost approximately 60 pounds since last visit.). He is following a  generally unhealthy diet. He has had a previous visit with a dietitian. He never participates in exercise. There is no change in his home blood glucose trend. His breakfast blood glucose range is generally 110-130 mg/dl. His bedtime blood glucose range is generally 130-140 mg/dl. His overall blood glucose range is 130-140 mg/dl. (He did not present his meter.  He reports blood glucose readings are between 101 125.  His point-of-care A1c is 6.1% today.  He denies hypoglycemia.   ) An ACE inhibitor/angiotensin II receptor blocker is being taken.  Hypertension This is a chronic problem. The current episode started more than 1 year ago. The problem is uncontrolled. Pertinent negatives include no chest pain, headaches, neck pain, palpitations or shortness of breath. Risk factors for coronary artery disease include diabetes mellitus, dyslipidemia, obesity, sedentary lifestyle and smoking/tobacco exposure. Past treatments include angiotensin blockers. Identifiable causes of hypertension include chronic renal disease.  Hyperlipidemia This is a chronic problem. The current episode started more than 1 year ago. Exacerbating diseases include chronic renal disease, diabetes and obesity. Pertinent negatives include no chest  pain, myalgias or shortness of breath. Current antihyperlipidemic treatment includes statins. Risk factors for coronary artery disease include diabetes mellitus, dyslipidemia, hypertension, family history, male sex, obesity and a sedentary lifestyle.    Review of Systems  Constitutional:  Negative for fatigue and unexpected weight change.  HENT:  Negative for dental problem, mouth sores and trouble swallowing.   Eyes:  Negative for visual disturbance.  Respiratory:  Negative for cough, choking, chest tightness, shortness of breath and wheezing.   Cardiovascular:  Negative for chest pain, palpitations and leg swelling.  Gastrointestinal:  Negative for abdominal distention, abdominal pain,  constipation, diarrhea, nausea and vomiting.  Endocrine: Negative for polydipsia, polyphagia and polyuria.  Genitourinary:  Negative for dysuria, flank pain, hematuria and urgency.  Musculoskeletal:  Negative for back pain, gait problem, myalgias and neck pain.  Skin:  Negative for pallor, rash and wound.  Neurological:  Negative for seizures, syncope, weakness, numbness and headaches.  Psychiatric/Behavioral: Negative.  Negative for confusion and dysphoric mood.     Objective:    BP (!) 84/46   Pulse 76   Ht 5' 6.5" (1.689 m)   Wt 193 lb (87.5 kg)   BMI 30.68 kg/m   Wt Readings from Last 3 Encounters:  02/12/22 193 lb (87.5 kg)  08/14/21 195 lb (88.5 kg)  03/23/21 195 lb (88.5 kg)      Results for orders placed or performed in visit on 02/12/22  HgB A1c  Result Value Ref Range   Hemoglobin A1C     HbA1c POC (<> result, manual entry)     HbA1c, POC (prediabetic range)     HbA1c, POC (controlled diabetic range) 6.1 0.0 - 7.0 %   Diabetic Labs (most recent): Lab Results  Component Value Date   HGBA1C 6.1 02/12/2022   HGBA1C 5.6 08/14/2021   HGBA1C 5.9 (H) 10/07/2020   MICROALBUR 142.0 12/04/2019   MICROALBUR 1.2 06/20/2017   Lipid Panel     Component Value Date/Time   CHOL 151 08/07/2021 0904   TRIG 82 08/07/2021 0904   HDL 76 08/07/2021 0904   CHOLHDL 2.0 08/07/2021 0904   CHOLHDL 3.1 04/08/2020 0724   VLDL 14 01/19/2016 0719   LDLCALC 60 08/07/2021 0904   LDLCALC 85 04/08/2020 0724     Assessment & Plan:   1. 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     - He remains at a high risk for more acute and chronic complications of diabetes which include CAD, CVA, retinopathy, and neuropathy. These are all discussed in detail with the patient.  He did not present his meter.  He reports blood glucose readings are between 101 125.  His point-of-care A1c is 6.1% today.  He denies hypoglycemia.    - I have re-counseled the patient on diet  management and weight loss  by adopting a carbohydrate restricted / protein rich  Diet.  - he acknowledges that there is a room for improvement in his food and drink choices. - Suggestion is made for him to avoid simple carbohydrates  from his diet including Cakes, Sweet Desserts, Ice Cream, Soda (diet and regular), Sweet Tea, Candies, Chips, Cookies, Store Bought Juices, Alcohol in Excess of  1-2 drinks a day, Artificial Sweeteners,  Coffee Creamer, and "Sugar-free" Products, Lemonade. This will help patient to have more stable blood glucose profile and potentially avoid unintended weight gain.   - Patient is advised to stick to a routine mealtimes to eat 3 meals  a day and  avoid unnecessary snacks ( to snack only to correct hypoglycemia).  - I have approached patient with the following individualized plan to manage diabetes and patient agrees.   -Emphasizing the need for monitoring blood glucose at least once a day before breakfast.  He is advised to continue glipizide 5 mg XL p.o. daily at breakfast.    -This is only diabetes medication he will be kept on for now. -He is encouraged to call clinic for blood glucose readings below 70 or greater than 200 mg/dl at fasting. - Patient specific target  for A1c; LDL, HDL, Triglycerides,  were discussed in detail.  2) BP/HTN: His blood pressure is controlled to target.  He is only on torsemide 100 mg p.o. daily.  He is advised to maintain close follow-up with his nephrologist.  3) Lipids/HPL: His recent lipid panel showed LDL is improving to 60.  He is advised to continue atorvastatin 20 mg nightly.   Side effects and precautions discussed with him.     4)  Weight/Diet: His BMI is 31.0-recently dropping from 43.3 during his last visit.  He has lost weight related to initiation of hemodialysis in the interval.     5) Chronic Care/Health Maintenance:  -Patient is on ACEI/ARB and Statin medications and encouraged to continue to follow up with  Ophthalmology, nephrology, cardiology, podiatrist at least yearly or according to recommendations, and advised to stay away from smoking. I have recommended yearly flu vaccine and pneumonia vaccination at least every 5 years; moderate intensity exercise for up to 150 minutes weekly; and  sleep for at least 7 hours a day.  He is currently on amiodarone related to his recent diagnosis of tachyarrhythmia-atrial flutter/atrial fibrillation.  He will need thyroid function test at least twice a year.  His last thyroid function test from August 07, 2021 showed TSH 1.16, free T4 1.75, this is consistent with euthyroid presentation.      - I advised patient to maintain close follow up with Sharilyn Sites, MD for primary care needs.   I spent 32 minutes in the care of the patient today including review of labs from [place], Lipids, Thyroid Function, Hematology (current and previous including abstractions from other facilities); face-to-face time discussing  his blood glucose readings/logs, discussing hypoglycemia and hyperglycemia episodes and symptoms, medications doses, his options of short and long term treatment based on the latest standards of care / guidelines;  discussion about incorporating lifestyle medicine;  and documenting the encounter. Risk reduction counseling performed per USPSTF guidelines to reduce obesity and cardiovascular risk factors.     Please refer to Patient Instructions for Blood Glucose Monitoring and Insulin/Medications Dosing Guide"  in media tab for additional information. Please  also refer to " Patient Self Inventory" in the Media  tab for reviewed elements of pertinent patient history.  Kyle Hicks participated in the discussions, expressed understanding, and voiced agreement with the above plans.  All questions were answered to his satisfaction. he is encouraged to contact clinic should he have any questions or concerns prior to his return visit.    Follow up plan: -Return  in about 6 months (around 08/15/2022) for Bring Meter/CGM Device/Logs- A1c in Office.  Glade Lloyd, MD Phone: [phone]  Fax: [phone]  -  This note was partially dictated with voice recognition software. Similar sounding words can be transcribed inadequately or may not  be corrected upon review.  02/12/2022, 12:24 PM

## 2022-02-13 DIAGNOSIS — D509 Iron deficiency anemia, unspecified: Secondary | ICD-10-CM | POA: Diagnosis not present

## 2022-02-13 DIAGNOSIS — E039 Hypothyroidism, unspecified: Secondary | ICD-10-CM | POA: Diagnosis not present

## 2022-02-13 DIAGNOSIS — Z992 Dependence on renal dialysis: Secondary | ICD-10-CM | POA: Diagnosis not present

## 2022-02-13 DIAGNOSIS — N186 End stage renal disease: Secondary | ICD-10-CM | POA: Diagnosis not present

## 2022-02-13 DIAGNOSIS — N2581 Secondary hyperparathyroidism of renal origin: Secondary | ICD-10-CM | POA: Diagnosis not present

## 2022-02-13 DIAGNOSIS — D689 Coagulation defect, unspecified: Secondary | ICD-10-CM | POA: Diagnosis not present

## 2022-02-13 DIAGNOSIS — E119 Type 2 diabetes mellitus without complications: Secondary | ICD-10-CM | POA: Diagnosis not present

## 2022-02-16 DIAGNOSIS — Z992 Dependence on renal dialysis: Secondary | ICD-10-CM | POA: Diagnosis not present

## 2022-02-16 DIAGNOSIS — D689 Coagulation defect, unspecified: Secondary | ICD-10-CM | POA: Diagnosis not present

## 2022-02-16 DIAGNOSIS — N2581 Secondary hyperparathyroidism of renal origin: Secondary | ICD-10-CM | POA: Diagnosis not present

## 2022-02-16 DIAGNOSIS — N186 End stage renal disease: Secondary | ICD-10-CM | POA: Diagnosis not present

## 2022-02-18 DIAGNOSIS — Z992 Dependence on renal dialysis: Secondary | ICD-10-CM | POA: Diagnosis not present

## 2022-02-18 DIAGNOSIS — N2581 Secondary hyperparathyroidism of renal origin: Secondary | ICD-10-CM | POA: Diagnosis not present

## 2022-02-18 DIAGNOSIS — N186 End stage renal disease: Secondary | ICD-10-CM | POA: Diagnosis not present

## 2022-02-18 DIAGNOSIS — D689 Coagulation defect, unspecified: Secondary | ICD-10-CM | POA: Diagnosis not present

## 2022-02-20 DIAGNOSIS — D689 Coagulation defect, unspecified: Secondary | ICD-10-CM | POA: Diagnosis not present

## 2022-02-20 DIAGNOSIS — N2581 Secondary hyperparathyroidism of renal origin: Secondary | ICD-10-CM | POA: Diagnosis not present

## 2022-02-20 DIAGNOSIS — N186 End stage renal disease: Secondary | ICD-10-CM | POA: Diagnosis not present

## 2022-02-20 DIAGNOSIS — Z992 Dependence on renal dialysis: Secondary | ICD-10-CM | POA: Diagnosis not present

## 2022-02-22 ENCOUNTER — Other Ambulatory Visit (HOSPITAL_COMMUNITY): Payer: Self-pay | Admitting: Internal Medicine

## 2022-02-22 DIAGNOSIS — N186 End stage renal disease: Secondary | ICD-10-CM | POA: Diagnosis not present

## 2022-02-22 DIAGNOSIS — I150 Renovascular hypertension: Secondary | ICD-10-CM | POA: Diagnosis not present

## 2022-02-22 DIAGNOSIS — Z992 Dependence on renal dialysis: Secondary | ICD-10-CM | POA: Diagnosis not present

## 2022-02-23 DIAGNOSIS — D689 Coagulation defect, unspecified: Secondary | ICD-10-CM | POA: Diagnosis not present

## 2022-02-23 DIAGNOSIS — N2581 Secondary hyperparathyroidism of renal origin: Secondary | ICD-10-CM | POA: Diagnosis not present

## 2022-02-23 DIAGNOSIS — Z992 Dependence on renal dialysis: Secondary | ICD-10-CM | POA: Diagnosis not present

## 2022-02-23 DIAGNOSIS — N186 End stage renal disease: Secondary | ICD-10-CM | POA: Diagnosis not present

## 2022-02-25 DIAGNOSIS — N2581 Secondary hyperparathyroidism of renal origin: Secondary | ICD-10-CM | POA: Diagnosis not present

## 2022-02-25 DIAGNOSIS — Z992 Dependence on renal dialysis: Secondary | ICD-10-CM | POA: Diagnosis not present

## 2022-02-25 DIAGNOSIS — D689 Coagulation defect, unspecified: Secondary | ICD-10-CM | POA: Diagnosis not present

## 2022-02-25 DIAGNOSIS — N186 End stage renal disease: Secondary | ICD-10-CM | POA: Diagnosis not present

## 2022-02-27 DIAGNOSIS — D689 Coagulation defect, unspecified: Secondary | ICD-10-CM | POA: Diagnosis not present

## 2022-02-27 DIAGNOSIS — N2581 Secondary hyperparathyroidism of renal origin: Secondary | ICD-10-CM | POA: Diagnosis not present

## 2022-02-27 DIAGNOSIS — Z992 Dependence on renal dialysis: Secondary | ICD-10-CM | POA: Diagnosis not present

## 2022-02-27 DIAGNOSIS — N186 End stage renal disease: Secondary | ICD-10-CM | POA: Diagnosis not present

## 2022-03-02 DIAGNOSIS — D689 Coagulation defect, unspecified: Secondary | ICD-10-CM | POA: Diagnosis not present

## 2022-03-02 DIAGNOSIS — N2581 Secondary hyperparathyroidism of renal origin: Secondary | ICD-10-CM | POA: Diagnosis not present

## 2022-03-02 DIAGNOSIS — Z992 Dependence on renal dialysis: Secondary | ICD-10-CM | POA: Diagnosis not present

## 2022-03-02 DIAGNOSIS — N186 End stage renal disease: Secondary | ICD-10-CM | POA: Diagnosis not present

## 2022-03-04 DIAGNOSIS — N186 End stage renal disease: Secondary | ICD-10-CM | POA: Diagnosis not present

## 2022-03-04 DIAGNOSIS — N2581 Secondary hyperparathyroidism of renal origin: Secondary | ICD-10-CM | POA: Diagnosis not present

## 2022-03-04 DIAGNOSIS — Z992 Dependence on renal dialysis: Secondary | ICD-10-CM | POA: Diagnosis not present

## 2022-03-04 DIAGNOSIS — D689 Coagulation defect, unspecified: Secondary | ICD-10-CM | POA: Diagnosis not present

## 2022-03-06 DIAGNOSIS — N2581 Secondary hyperparathyroidism of renal origin: Secondary | ICD-10-CM | POA: Diagnosis not present

## 2022-03-06 DIAGNOSIS — D689 Coagulation defect, unspecified: Secondary | ICD-10-CM | POA: Diagnosis not present

## 2022-03-06 DIAGNOSIS — N186 End stage renal disease: Secondary | ICD-10-CM | POA: Diagnosis not present

## 2022-03-06 DIAGNOSIS — Z992 Dependence on renal dialysis: Secondary | ICD-10-CM | POA: Diagnosis not present

## 2022-03-08 DIAGNOSIS — Z992 Dependence on renal dialysis: Secondary | ICD-10-CM | POA: Diagnosis not present

## 2022-03-08 DIAGNOSIS — E1101 Type 2 diabetes mellitus with hyperosmolarity with coma: Secondary | ICD-10-CM | POA: Diagnosis not present

## 2022-03-08 DIAGNOSIS — Z1331 Encounter for screening for depression: Secondary | ICD-10-CM | POA: Diagnosis not present

## 2022-03-08 DIAGNOSIS — Z6830 Body mass index (BMI) 30.0-30.9, adult: Secondary | ICD-10-CM | POA: Diagnosis not present

## 2022-03-08 DIAGNOSIS — Z0001 Encounter for general adult medical examination with abnormal findings: Secondary | ICD-10-CM | POA: Diagnosis not present

## 2022-03-08 DIAGNOSIS — I4892 Unspecified atrial flutter: Secondary | ICD-10-CM | POA: Diagnosis not present

## 2022-03-08 DIAGNOSIS — E6609 Other obesity due to excess calories: Secondary | ICD-10-CM | POA: Diagnosis not present

## 2022-03-08 DIAGNOSIS — N186 End stage renal disease: Secondary | ICD-10-CM | POA: Diagnosis not present

## 2022-03-08 DIAGNOSIS — I509 Heart failure, unspecified: Secondary | ICD-10-CM | POA: Diagnosis not present

## 2022-03-09 DIAGNOSIS — N186 End stage renal disease: Secondary | ICD-10-CM | POA: Diagnosis not present

## 2022-03-09 DIAGNOSIS — D689 Coagulation defect, unspecified: Secondary | ICD-10-CM | POA: Diagnosis not present

## 2022-03-09 DIAGNOSIS — E119 Type 2 diabetes mellitus without complications: Secondary | ICD-10-CM | POA: Diagnosis not present

## 2022-03-09 DIAGNOSIS — Z992 Dependence on renal dialysis: Secondary | ICD-10-CM | POA: Diagnosis not present

## 2022-03-09 DIAGNOSIS — N2581 Secondary hyperparathyroidism of renal origin: Secondary | ICD-10-CM | POA: Diagnosis not present

## 2022-03-11 DIAGNOSIS — N2581 Secondary hyperparathyroidism of renal origin: Secondary | ICD-10-CM | POA: Diagnosis not present

## 2022-03-11 DIAGNOSIS — E119 Type 2 diabetes mellitus without complications: Secondary | ICD-10-CM | POA: Diagnosis not present

## 2022-03-11 DIAGNOSIS — D689 Coagulation defect, unspecified: Secondary | ICD-10-CM | POA: Diagnosis not present

## 2022-03-11 DIAGNOSIS — Z992 Dependence on renal dialysis: Secondary | ICD-10-CM | POA: Diagnosis not present

## 2022-03-11 DIAGNOSIS — N186 End stage renal disease: Secondary | ICD-10-CM | POA: Diagnosis not present

## 2022-03-13 DIAGNOSIS — Z992 Dependence on renal dialysis: Secondary | ICD-10-CM | POA: Diagnosis not present

## 2022-03-13 DIAGNOSIS — E119 Type 2 diabetes mellitus without complications: Secondary | ICD-10-CM | POA: Diagnosis not present

## 2022-03-13 DIAGNOSIS — N2581 Secondary hyperparathyroidism of renal origin: Secondary | ICD-10-CM | POA: Diagnosis not present

## 2022-03-13 DIAGNOSIS — N186 End stage renal disease: Secondary | ICD-10-CM | POA: Diagnosis not present

## 2022-03-13 DIAGNOSIS — D689 Coagulation defect, unspecified: Secondary | ICD-10-CM | POA: Diagnosis not present

## 2022-03-16 DIAGNOSIS — N2581 Secondary hyperparathyroidism of renal origin: Secondary | ICD-10-CM | POA: Diagnosis not present

## 2022-03-16 DIAGNOSIS — N186 End stage renal disease: Secondary | ICD-10-CM | POA: Diagnosis not present

## 2022-03-16 DIAGNOSIS — Z992 Dependence on renal dialysis: Secondary | ICD-10-CM | POA: Diagnosis not present

## 2022-03-16 DIAGNOSIS — D689 Coagulation defect, unspecified: Secondary | ICD-10-CM | POA: Diagnosis not present

## 2022-03-17 ENCOUNTER — Other Ambulatory Visit (HOSPITAL_COMMUNITY): Payer: Self-pay | Admitting: Internal Medicine

## 2022-03-18 DIAGNOSIS — Z992 Dependence on renal dialysis: Secondary | ICD-10-CM | POA: Diagnosis not present

## 2022-03-18 DIAGNOSIS — N2581 Secondary hyperparathyroidism of renal origin: Secondary | ICD-10-CM | POA: Diagnosis not present

## 2022-03-18 DIAGNOSIS — N186 End stage renal disease: Secondary | ICD-10-CM | POA: Diagnosis not present

## 2022-03-18 DIAGNOSIS — D689 Coagulation defect, unspecified: Secondary | ICD-10-CM | POA: Diagnosis not present

## 2022-03-20 DIAGNOSIS — Z992 Dependence on renal dialysis: Secondary | ICD-10-CM | POA: Diagnosis not present

## 2022-03-20 DIAGNOSIS — N2581 Secondary hyperparathyroidism of renal origin: Secondary | ICD-10-CM | POA: Diagnosis not present

## 2022-03-20 DIAGNOSIS — N186 End stage renal disease: Secondary | ICD-10-CM | POA: Diagnosis not present

## 2022-03-20 DIAGNOSIS — D689 Coagulation defect, unspecified: Secondary | ICD-10-CM | POA: Diagnosis not present

## 2022-03-23 DIAGNOSIS — Z992 Dependence on renal dialysis: Secondary | ICD-10-CM | POA: Diagnosis not present

## 2022-03-23 DIAGNOSIS — D689 Coagulation defect, unspecified: Secondary | ICD-10-CM | POA: Diagnosis not present

## 2022-03-23 DIAGNOSIS — N2581 Secondary hyperparathyroidism of renal origin: Secondary | ICD-10-CM | POA: Diagnosis not present

## 2022-03-23 DIAGNOSIS — N186 End stage renal disease: Secondary | ICD-10-CM | POA: Diagnosis not present

## 2022-03-25 ENCOUNTER — Other Ambulatory Visit: Payer: Self-pay | Admitting: "Endocrinology

## 2022-03-25 DIAGNOSIS — I150 Renovascular hypertension: Secondary | ICD-10-CM | POA: Diagnosis not present

## 2022-03-25 DIAGNOSIS — Z992 Dependence on renal dialysis: Secondary | ICD-10-CM | POA: Diagnosis not present

## 2022-03-25 DIAGNOSIS — N186 End stage renal disease: Secondary | ICD-10-CM | POA: Diagnosis not present

## 2022-03-25 DIAGNOSIS — N2581 Secondary hyperparathyroidism of renal origin: Secondary | ICD-10-CM | POA: Diagnosis not present

## 2022-03-25 DIAGNOSIS — D689 Coagulation defect, unspecified: Secondary | ICD-10-CM | POA: Diagnosis not present

## 2022-03-26 ENCOUNTER — Other Ambulatory Visit: Payer: Self-pay | Admitting: "Endocrinology

## 2022-03-27 DIAGNOSIS — D689 Coagulation defect, unspecified: Secondary | ICD-10-CM | POA: Diagnosis not present

## 2022-03-27 DIAGNOSIS — N186 End stage renal disease: Secondary | ICD-10-CM | POA: Diagnosis not present

## 2022-03-27 DIAGNOSIS — N2581 Secondary hyperparathyroidism of renal origin: Secondary | ICD-10-CM | POA: Diagnosis not present

## 2022-03-27 DIAGNOSIS — Z992 Dependence on renal dialysis: Secondary | ICD-10-CM | POA: Diagnosis not present

## 2022-03-30 DIAGNOSIS — D689 Coagulation defect, unspecified: Secondary | ICD-10-CM | POA: Diagnosis not present

## 2022-03-30 DIAGNOSIS — Z992 Dependence on renal dialysis: Secondary | ICD-10-CM | POA: Diagnosis not present

## 2022-03-30 DIAGNOSIS — N2581 Secondary hyperparathyroidism of renal origin: Secondary | ICD-10-CM | POA: Diagnosis not present

## 2022-03-30 DIAGNOSIS — N186 End stage renal disease: Secondary | ICD-10-CM | POA: Diagnosis not present

## 2022-03-31 DIAGNOSIS — Z992 Dependence on renal dialysis: Secondary | ICD-10-CM | POA: Diagnosis not present

## 2022-03-31 DIAGNOSIS — I871 Compression of vein: Secondary | ICD-10-CM | POA: Diagnosis not present

## 2022-03-31 DIAGNOSIS — N186 End stage renal disease: Secondary | ICD-10-CM | POA: Diagnosis not present

## 2022-03-31 DIAGNOSIS — T82858A Stenosis of vascular prosthetic devices, implants and grafts, initial encounter: Secondary | ICD-10-CM | POA: Diagnosis not present

## 2022-04-01 DIAGNOSIS — N186 End stage renal disease: Secondary | ICD-10-CM | POA: Diagnosis not present

## 2022-04-01 DIAGNOSIS — N2581 Secondary hyperparathyroidism of renal origin: Secondary | ICD-10-CM | POA: Diagnosis not present

## 2022-04-01 DIAGNOSIS — D689 Coagulation defect, unspecified: Secondary | ICD-10-CM | POA: Diagnosis not present

## 2022-04-01 DIAGNOSIS — Z992 Dependence on renal dialysis: Secondary | ICD-10-CM | POA: Diagnosis not present

## 2022-04-03 DIAGNOSIS — Z992 Dependence on renal dialysis: Secondary | ICD-10-CM | POA: Diagnosis not present

## 2022-04-03 DIAGNOSIS — N2581 Secondary hyperparathyroidism of renal origin: Secondary | ICD-10-CM | POA: Diagnosis not present

## 2022-04-03 DIAGNOSIS — N186 End stage renal disease: Secondary | ICD-10-CM | POA: Diagnosis not present

## 2022-04-03 DIAGNOSIS — D689 Coagulation defect, unspecified: Secondary | ICD-10-CM | POA: Diagnosis not present

## 2022-04-06 DIAGNOSIS — N2581 Secondary hyperparathyroidism of renal origin: Secondary | ICD-10-CM | POA: Diagnosis not present

## 2022-04-06 DIAGNOSIS — D689 Coagulation defect, unspecified: Secondary | ICD-10-CM | POA: Diagnosis not present

## 2022-04-06 DIAGNOSIS — N186 End stage renal disease: Secondary | ICD-10-CM | POA: Diagnosis not present

## 2022-04-06 DIAGNOSIS — Z992 Dependence on renal dialysis: Secondary | ICD-10-CM | POA: Diagnosis not present

## 2022-04-08 DIAGNOSIS — N186 End stage renal disease: Secondary | ICD-10-CM | POA: Diagnosis not present

## 2022-04-08 DIAGNOSIS — D689 Coagulation defect, unspecified: Secondary | ICD-10-CM | POA: Diagnosis not present

## 2022-04-08 DIAGNOSIS — N2581 Secondary hyperparathyroidism of renal origin: Secondary | ICD-10-CM | POA: Diagnosis not present

## 2022-04-08 DIAGNOSIS — Z992 Dependence on renal dialysis: Secondary | ICD-10-CM | POA: Diagnosis not present

## 2022-04-10 DIAGNOSIS — N2581 Secondary hyperparathyroidism of renal origin: Secondary | ICD-10-CM | POA: Diagnosis not present

## 2022-04-10 DIAGNOSIS — D689 Coagulation defect, unspecified: Secondary | ICD-10-CM | POA: Diagnosis not present

## 2022-04-10 DIAGNOSIS — N186 End stage renal disease: Secondary | ICD-10-CM | POA: Diagnosis not present

## 2022-04-10 DIAGNOSIS — Z992 Dependence on renal dialysis: Secondary | ICD-10-CM | POA: Diagnosis not present

## 2022-04-13 DIAGNOSIS — D689 Coagulation defect, unspecified: Secondary | ICD-10-CM | POA: Diagnosis not present

## 2022-04-13 DIAGNOSIS — Z992 Dependence on renal dialysis: Secondary | ICD-10-CM | POA: Diagnosis not present

## 2022-04-13 DIAGNOSIS — E119 Type 2 diabetes mellitus without complications: Secondary | ICD-10-CM | POA: Diagnosis not present

## 2022-04-13 DIAGNOSIS — N2581 Secondary hyperparathyroidism of renal origin: Secondary | ICD-10-CM | POA: Diagnosis not present

## 2022-04-13 DIAGNOSIS — N186 End stage renal disease: Secondary | ICD-10-CM | POA: Diagnosis not present

## 2022-04-15 DIAGNOSIS — N2581 Secondary hyperparathyroidism of renal origin: Secondary | ICD-10-CM | POA: Diagnosis not present

## 2022-04-15 DIAGNOSIS — N186 End stage renal disease: Secondary | ICD-10-CM | POA: Diagnosis not present

## 2022-04-15 DIAGNOSIS — Z992 Dependence on renal dialysis: Secondary | ICD-10-CM | POA: Diagnosis not present

## 2022-04-15 DIAGNOSIS — D689 Coagulation defect, unspecified: Secondary | ICD-10-CM | POA: Diagnosis not present

## 2022-04-15 DIAGNOSIS — E119 Type 2 diabetes mellitus without complications: Secondary | ICD-10-CM | POA: Diagnosis not present

## 2022-04-17 DIAGNOSIS — E119 Type 2 diabetes mellitus without complications: Secondary | ICD-10-CM | POA: Diagnosis not present

## 2022-04-17 DIAGNOSIS — N2581 Secondary hyperparathyroidism of renal origin: Secondary | ICD-10-CM | POA: Diagnosis not present

## 2022-04-17 DIAGNOSIS — D689 Coagulation defect, unspecified: Secondary | ICD-10-CM | POA: Diagnosis not present

## 2022-04-17 DIAGNOSIS — N186 End stage renal disease: Secondary | ICD-10-CM | POA: Diagnosis not present

## 2022-04-17 DIAGNOSIS — Z992 Dependence on renal dialysis: Secondary | ICD-10-CM | POA: Diagnosis not present

## 2022-04-19 ENCOUNTER — Other Ambulatory Visit (HOSPITAL_COMMUNITY): Payer: Self-pay | Admitting: Internal Medicine

## 2022-04-20 DIAGNOSIS — Z992 Dependence on renal dialysis: Secondary | ICD-10-CM | POA: Diagnosis not present

## 2022-04-20 DIAGNOSIS — N2581 Secondary hyperparathyroidism of renal origin: Secondary | ICD-10-CM | POA: Diagnosis not present

## 2022-04-20 DIAGNOSIS — D689 Coagulation defect, unspecified: Secondary | ICD-10-CM | POA: Diagnosis not present

## 2022-04-20 DIAGNOSIS — N186 End stage renal disease: Secondary | ICD-10-CM | POA: Diagnosis not present

## 2022-04-22 DIAGNOSIS — N2581 Secondary hyperparathyroidism of renal origin: Secondary | ICD-10-CM | POA: Diagnosis not present

## 2022-04-22 DIAGNOSIS — D689 Coagulation defect, unspecified: Secondary | ICD-10-CM | POA: Diagnosis not present

## 2022-04-22 DIAGNOSIS — Z992 Dependence on renal dialysis: Secondary | ICD-10-CM | POA: Diagnosis not present

## 2022-04-22 DIAGNOSIS — N186 End stage renal disease: Secondary | ICD-10-CM | POA: Diagnosis not present

## 2022-04-24 DIAGNOSIS — D689 Coagulation defect, unspecified: Secondary | ICD-10-CM | POA: Diagnosis not present

## 2022-04-24 DIAGNOSIS — I150 Renovascular hypertension: Secondary | ICD-10-CM | POA: Diagnosis not present

## 2022-04-24 DIAGNOSIS — N186 End stage renal disease: Secondary | ICD-10-CM | POA: Diagnosis not present

## 2022-04-24 DIAGNOSIS — Z992 Dependence on renal dialysis: Secondary | ICD-10-CM | POA: Diagnosis not present

## 2022-04-24 DIAGNOSIS — N2581 Secondary hyperparathyroidism of renal origin: Secondary | ICD-10-CM | POA: Diagnosis not present

## 2022-04-27 DIAGNOSIS — N2581 Secondary hyperparathyroidism of renal origin: Secondary | ICD-10-CM | POA: Diagnosis not present

## 2022-04-27 DIAGNOSIS — N186 End stage renal disease: Secondary | ICD-10-CM | POA: Diagnosis not present

## 2022-04-27 DIAGNOSIS — Z992 Dependence on renal dialysis: Secondary | ICD-10-CM | POA: Diagnosis not present

## 2022-04-27 DIAGNOSIS — D689 Coagulation defect, unspecified: Secondary | ICD-10-CM | POA: Diagnosis not present

## 2022-04-29 DIAGNOSIS — Z992 Dependence on renal dialysis: Secondary | ICD-10-CM | POA: Diagnosis not present

## 2022-04-29 DIAGNOSIS — D689 Coagulation defect, unspecified: Secondary | ICD-10-CM | POA: Diagnosis not present

## 2022-04-29 DIAGNOSIS — N186 End stage renal disease: Secondary | ICD-10-CM | POA: Diagnosis not present

## 2022-04-29 DIAGNOSIS — N2581 Secondary hyperparathyroidism of renal origin: Secondary | ICD-10-CM | POA: Diagnosis not present

## 2022-05-01 DIAGNOSIS — N186 End stage renal disease: Secondary | ICD-10-CM | POA: Diagnosis not present

## 2022-05-01 DIAGNOSIS — Z992 Dependence on renal dialysis: Secondary | ICD-10-CM | POA: Diagnosis not present

## 2022-05-01 DIAGNOSIS — D689 Coagulation defect, unspecified: Secondary | ICD-10-CM | POA: Diagnosis not present

## 2022-05-01 DIAGNOSIS — N2581 Secondary hyperparathyroidism of renal origin: Secondary | ICD-10-CM | POA: Diagnosis not present

## 2022-05-04 DIAGNOSIS — N186 End stage renal disease: Secondary | ICD-10-CM | POA: Diagnosis not present

## 2022-05-04 DIAGNOSIS — D689 Coagulation defect, unspecified: Secondary | ICD-10-CM | POA: Diagnosis not present

## 2022-05-04 DIAGNOSIS — N2581 Secondary hyperparathyroidism of renal origin: Secondary | ICD-10-CM | POA: Diagnosis not present

## 2022-05-04 DIAGNOSIS — Z992 Dependence on renal dialysis: Secondary | ICD-10-CM | POA: Diagnosis not present

## 2022-05-06 DIAGNOSIS — N2581 Secondary hyperparathyroidism of renal origin: Secondary | ICD-10-CM | POA: Diagnosis not present

## 2022-05-06 DIAGNOSIS — Z992 Dependence on renal dialysis: Secondary | ICD-10-CM | POA: Diagnosis not present

## 2022-05-06 DIAGNOSIS — N186 End stage renal disease: Secondary | ICD-10-CM | POA: Diagnosis not present

## 2022-05-06 DIAGNOSIS — D689 Coagulation defect, unspecified: Secondary | ICD-10-CM | POA: Diagnosis not present

## 2022-05-07 ENCOUNTER — Other Ambulatory Visit (HOSPITAL_COMMUNITY): Payer: Self-pay | Admitting: Internal Medicine

## 2022-05-07 DIAGNOSIS — Z23 Encounter for immunization: Secondary | ICD-10-CM | POA: Diagnosis not present

## 2022-05-08 DIAGNOSIS — Z992 Dependence on renal dialysis: Secondary | ICD-10-CM | POA: Diagnosis not present

## 2022-05-08 DIAGNOSIS — D689 Coagulation defect, unspecified: Secondary | ICD-10-CM | POA: Diagnosis not present

## 2022-05-08 DIAGNOSIS — N2581 Secondary hyperparathyroidism of renal origin: Secondary | ICD-10-CM | POA: Diagnosis not present

## 2022-05-08 DIAGNOSIS — N186 End stage renal disease: Secondary | ICD-10-CM | POA: Diagnosis not present

## 2022-05-11 DIAGNOSIS — D689 Coagulation defect, unspecified: Secondary | ICD-10-CM | POA: Diagnosis not present

## 2022-05-11 DIAGNOSIS — N186 End stage renal disease: Secondary | ICD-10-CM | POA: Diagnosis not present

## 2022-05-11 DIAGNOSIS — E119 Type 2 diabetes mellitus without complications: Secondary | ICD-10-CM | POA: Diagnosis not present

## 2022-05-11 DIAGNOSIS — E039 Hypothyroidism, unspecified: Secondary | ICD-10-CM | POA: Diagnosis not present

## 2022-05-11 DIAGNOSIS — N2581 Secondary hyperparathyroidism of renal origin: Secondary | ICD-10-CM | POA: Diagnosis not present

## 2022-05-11 DIAGNOSIS — Z992 Dependence on renal dialysis: Secondary | ICD-10-CM | POA: Diagnosis not present

## 2022-05-13 ENCOUNTER — Telehealth: Payer: Self-pay | Admitting: *Deleted

## 2022-05-13 DIAGNOSIS — E119 Type 2 diabetes mellitus without complications: Secondary | ICD-10-CM | POA: Diagnosis not present

## 2022-05-13 DIAGNOSIS — E039 Hypothyroidism, unspecified: Secondary | ICD-10-CM | POA: Diagnosis not present

## 2022-05-13 DIAGNOSIS — D689 Coagulation defect, unspecified: Secondary | ICD-10-CM | POA: Diagnosis not present

## 2022-05-13 DIAGNOSIS — Z992 Dependence on renal dialysis: Secondary | ICD-10-CM | POA: Diagnosis not present

## 2022-05-13 DIAGNOSIS — N2581 Secondary hyperparathyroidism of renal origin: Secondary | ICD-10-CM | POA: Diagnosis not present

## 2022-05-13 DIAGNOSIS — N186 End stage renal disease: Secondary | ICD-10-CM | POA: Diagnosis not present

## 2022-05-13 NOTE — Chronic Care Management (AMB) (Signed)
  Care Coordination  Outreach Note  05/13/2022 Name: Kyle Hicks MRN: [id number] DOB: [birth year]   Care Coordination Outreach Attempts: An unsuccessful telephone outreach was attempted today to offer the patient information about available care coordination services as a benefit of their health plan.   Follow Up Plan:  Additional outreach attempts will be made to offer the patient care coordination information and services.   Encounter Outcome:  No Answer  [place]  Direct Dial: [phone]

## 2022-05-15 DIAGNOSIS — D689 Coagulation defect, unspecified: Secondary | ICD-10-CM | POA: Diagnosis not present

## 2022-05-15 DIAGNOSIS — E039 Hypothyroidism, unspecified: Secondary | ICD-10-CM | POA: Diagnosis not present

## 2022-05-15 DIAGNOSIS — Z992 Dependence on renal dialysis: Secondary | ICD-10-CM | POA: Diagnosis not present

## 2022-05-15 DIAGNOSIS — N2581 Secondary hyperparathyroidism of renal origin: Secondary | ICD-10-CM | POA: Diagnosis not present

## 2022-05-15 DIAGNOSIS — N186 End stage renal disease: Secondary | ICD-10-CM | POA: Diagnosis not present

## 2022-05-15 DIAGNOSIS — E119 Type 2 diabetes mellitus without complications: Secondary | ICD-10-CM | POA: Diagnosis not present

## 2022-05-18 DIAGNOSIS — N2581 Secondary hyperparathyroidism of renal origin: Secondary | ICD-10-CM | POA: Diagnosis not present

## 2022-05-18 DIAGNOSIS — Z992 Dependence on renal dialysis: Secondary | ICD-10-CM | POA: Diagnosis not present

## 2022-05-18 DIAGNOSIS — D689 Coagulation defect, unspecified: Secondary | ICD-10-CM | POA: Diagnosis not present

## 2022-05-18 DIAGNOSIS — N186 End stage renal disease: Secondary | ICD-10-CM | POA: Diagnosis not present

## 2022-05-19 NOTE — Chronic Care Management (AMB) (Signed)
  Care Coordination  Outreach Note  05/19/2022 Name: Kyle Hicks MRN: [id number] DOB: 1941-02-25   Care Coordination Outreach Attempts: A second unsuccessful outreach was attempted today to offer the patient with information about available care coordination services as a benefit of their health plan.     Follow Up Plan:  Additional outreach attempts will be made to offer the patient care coordination information and services.   Encounter Outcome:  No Answer  [place]  Direct Dial: [phone]

## 2022-05-20 DIAGNOSIS — D689 Coagulation defect, unspecified: Secondary | ICD-10-CM | POA: Diagnosis not present

## 2022-05-20 DIAGNOSIS — N2581 Secondary hyperparathyroidism of renal origin: Secondary | ICD-10-CM | POA: Diagnosis not present

## 2022-05-20 DIAGNOSIS — N186 End stage renal disease: Secondary | ICD-10-CM | POA: Diagnosis not present

## 2022-05-20 DIAGNOSIS — Z992 Dependence on renal dialysis: Secondary | ICD-10-CM | POA: Diagnosis not present

## 2022-05-22 DIAGNOSIS — N2581 Secondary hyperparathyroidism of renal origin: Secondary | ICD-10-CM | POA: Diagnosis not present

## 2022-05-22 DIAGNOSIS — D689 Coagulation defect, unspecified: Secondary | ICD-10-CM | POA: Diagnosis not present

## 2022-05-22 DIAGNOSIS — N186 End stage renal disease: Secondary | ICD-10-CM | POA: Diagnosis not present

## 2022-05-22 DIAGNOSIS — Z992 Dependence on renal dialysis: Secondary | ICD-10-CM | POA: Diagnosis not present

## 2022-05-24 DIAGNOSIS — I871 Compression of vein: Secondary | ICD-10-CM | POA: Diagnosis not present

## 2022-05-24 DIAGNOSIS — N186 End stage renal disease: Secondary | ICD-10-CM | POA: Diagnosis not present

## 2022-05-24 DIAGNOSIS — Z992 Dependence on renal dialysis: Secondary | ICD-10-CM | POA: Diagnosis not present

## 2022-05-24 DIAGNOSIS — T82858A Stenosis of vascular prosthetic devices, implants and grafts, initial encounter: Secondary | ICD-10-CM | POA: Diagnosis not present

## 2022-05-25 DIAGNOSIS — N2581 Secondary hyperparathyroidism of renal origin: Secondary | ICD-10-CM | POA: Diagnosis not present

## 2022-05-25 DIAGNOSIS — N186 End stage renal disease: Secondary | ICD-10-CM | POA: Diagnosis not present

## 2022-05-25 DIAGNOSIS — Z992 Dependence on renal dialysis: Secondary | ICD-10-CM | POA: Diagnosis not present

## 2022-05-25 DIAGNOSIS — I150 Renovascular hypertension: Secondary | ICD-10-CM | POA: Diagnosis not present

## 2022-05-25 DIAGNOSIS — D689 Coagulation defect, unspecified: Secondary | ICD-10-CM | POA: Diagnosis not present

## 2022-05-26 NOTE — Chronic Care Management (AMB) (Signed)
  Care Coordination   Note   05/26/2022 Name: ANEES VANECEK MRN: [id number] DOB: 02-06-1941  SATYA BUTTRAM is a 81 y.o. year old male who sees Sharilyn Sites, MD for primary care. I reached out to Evalee Jefferson by phone today to offer care coordination services.  Mr. Rog was given information about Care Coordination services today including:   The Care Coordination services include support from the care team which includes your Nurse Coordinator, Clinical Social Worker, or Pharmacist.  The Care Coordination team is here to help remove barriers to the health concerns and goals most important to you. Care Coordination services are voluntary, and the patient may decline or stop services at any time by request to their care team member.   Care Coordination Consent Status: Patient agreed to services and verbal consent obtained.   Follow up plan:  Telephone appointment with care coordination team member scheduled for:  11/17  Encounter Outcome:  Pt. Scheduled  [place]  Direct Dial: [phone]

## 2022-05-27 DIAGNOSIS — N186 End stage renal disease: Secondary | ICD-10-CM | POA: Diagnosis not present

## 2022-05-27 DIAGNOSIS — D689 Coagulation defect, unspecified: Secondary | ICD-10-CM | POA: Diagnosis not present

## 2022-05-27 DIAGNOSIS — N2581 Secondary hyperparathyroidism of renal origin: Secondary | ICD-10-CM | POA: Diagnosis not present

## 2022-05-27 DIAGNOSIS — Z992 Dependence on renal dialysis: Secondary | ICD-10-CM | POA: Diagnosis not present

## 2022-05-29 DIAGNOSIS — Z992 Dependence on renal dialysis: Secondary | ICD-10-CM | POA: Diagnosis not present

## 2022-05-29 DIAGNOSIS — D689 Coagulation defect, unspecified: Secondary | ICD-10-CM | POA: Diagnosis not present

## 2022-05-29 DIAGNOSIS — N2581 Secondary hyperparathyroidism of renal origin: Secondary | ICD-10-CM | POA: Diagnosis not present

## 2022-05-29 DIAGNOSIS — N186 End stage renal disease: Secondary | ICD-10-CM | POA: Diagnosis not present

## 2022-06-01 DIAGNOSIS — N186 End stage renal disease: Secondary | ICD-10-CM | POA: Diagnosis not present

## 2022-06-01 DIAGNOSIS — D689 Coagulation defect, unspecified: Secondary | ICD-10-CM | POA: Diagnosis not present

## 2022-06-01 DIAGNOSIS — Z992 Dependence on renal dialysis: Secondary | ICD-10-CM | POA: Diagnosis not present

## 2022-06-01 DIAGNOSIS — N2581 Secondary hyperparathyroidism of renal origin: Secondary | ICD-10-CM | POA: Diagnosis not present

## 2022-06-03 DIAGNOSIS — D689 Coagulation defect, unspecified: Secondary | ICD-10-CM | POA: Diagnosis not present

## 2022-06-03 DIAGNOSIS — N2581 Secondary hyperparathyroidism of renal origin: Secondary | ICD-10-CM | POA: Diagnosis not present

## 2022-06-03 DIAGNOSIS — Z992 Dependence on renal dialysis: Secondary | ICD-10-CM | POA: Diagnosis not present

## 2022-06-03 DIAGNOSIS — N186 End stage renal disease: Secondary | ICD-10-CM | POA: Diagnosis not present

## 2022-06-05 DIAGNOSIS — N186 End stage renal disease: Secondary | ICD-10-CM | POA: Diagnosis not present

## 2022-06-05 DIAGNOSIS — Z992 Dependence on renal dialysis: Secondary | ICD-10-CM | POA: Diagnosis not present

## 2022-06-05 DIAGNOSIS — N2581 Secondary hyperparathyroidism of renal origin: Secondary | ICD-10-CM | POA: Diagnosis not present

## 2022-06-05 DIAGNOSIS — D689 Coagulation defect, unspecified: Secondary | ICD-10-CM | POA: Diagnosis not present

## 2022-06-08 DIAGNOSIS — E039 Hypothyroidism, unspecified: Secondary | ICD-10-CM | POA: Diagnosis not present

## 2022-06-08 DIAGNOSIS — E119 Type 2 diabetes mellitus without complications: Secondary | ICD-10-CM | POA: Diagnosis not present

## 2022-06-08 DIAGNOSIS — D689 Coagulation defect, unspecified: Secondary | ICD-10-CM | POA: Diagnosis not present

## 2022-06-08 DIAGNOSIS — N186 End stage renal disease: Secondary | ICD-10-CM | POA: Diagnosis not present

## 2022-06-08 DIAGNOSIS — Z992 Dependence on renal dialysis: Secondary | ICD-10-CM | POA: Diagnosis not present

## 2022-06-08 DIAGNOSIS — N2581 Secondary hyperparathyroidism of renal origin: Secondary | ICD-10-CM | POA: Diagnosis not present

## 2022-06-10 DIAGNOSIS — D689 Coagulation defect, unspecified: Secondary | ICD-10-CM | POA: Diagnosis not present

## 2022-06-10 DIAGNOSIS — N186 End stage renal disease: Secondary | ICD-10-CM | POA: Diagnosis not present

## 2022-06-10 DIAGNOSIS — Z992 Dependence on renal dialysis: Secondary | ICD-10-CM | POA: Diagnosis not present

## 2022-06-10 DIAGNOSIS — E119 Type 2 diabetes mellitus without complications: Secondary | ICD-10-CM | POA: Diagnosis not present

## 2022-06-10 DIAGNOSIS — E039 Hypothyroidism, unspecified: Secondary | ICD-10-CM | POA: Diagnosis not present

## 2022-06-10 DIAGNOSIS — N2581 Secondary hyperparathyroidism of renal origin: Secondary | ICD-10-CM | POA: Diagnosis not present

## 2022-06-11 ENCOUNTER — Ambulatory Visit: Payer: Self-pay | Admitting: *Deleted

## 2022-06-11 NOTE — Patient Outreach (Signed)
  Care Coordination   06/11/2022 Name: Kyle Hicks MRN: [id number] DOB: 1941/03/22   Care Coordination Outreach Attempts:  An unsuccessful telephone outreach was attempted for a scheduled appointment today.  Follow Up Plan:  Additional outreach attempts will be made to offer the patient care coordination information and services.   Encounter Outcome:  No Answer  Care Coordination Interventions Activated:  No   Care Coordination Interventions:  No, not indicated    Chong Sicilian, BSN, RN-BC RN Care Coordinator [place]: [phone] Main #: [phone]

## 2022-06-12 DIAGNOSIS — D689 Coagulation defect, unspecified: Secondary | ICD-10-CM | POA: Diagnosis not present

## 2022-06-12 DIAGNOSIS — E119 Type 2 diabetes mellitus without complications: Secondary | ICD-10-CM | POA: Diagnosis not present

## 2022-06-12 DIAGNOSIS — N2581 Secondary hyperparathyroidism of renal origin: Secondary | ICD-10-CM | POA: Diagnosis not present

## 2022-06-12 DIAGNOSIS — Z992 Dependence on renal dialysis: Secondary | ICD-10-CM | POA: Diagnosis not present

## 2022-06-12 DIAGNOSIS — E039 Hypothyroidism, unspecified: Secondary | ICD-10-CM | POA: Diagnosis not present

## 2022-06-12 DIAGNOSIS — N186 End stage renal disease: Secondary | ICD-10-CM | POA: Diagnosis not present

## 2022-06-14 DIAGNOSIS — N2581 Secondary hyperparathyroidism of renal origin: Secondary | ICD-10-CM | POA: Diagnosis not present

## 2022-06-14 DIAGNOSIS — N186 End stage renal disease: Secondary | ICD-10-CM | POA: Diagnosis not present

## 2022-06-14 DIAGNOSIS — D689 Coagulation defect, unspecified: Secondary | ICD-10-CM | POA: Diagnosis not present

## 2022-06-14 DIAGNOSIS — Z992 Dependence on renal dialysis: Secondary | ICD-10-CM | POA: Diagnosis not present

## 2022-06-16 DIAGNOSIS — Z992 Dependence on renal dialysis: Secondary | ICD-10-CM | POA: Diagnosis not present

## 2022-06-16 DIAGNOSIS — N186 End stage renal disease: Secondary | ICD-10-CM | POA: Diagnosis not present

## 2022-06-16 DIAGNOSIS — D689 Coagulation defect, unspecified: Secondary | ICD-10-CM | POA: Diagnosis not present

## 2022-06-16 DIAGNOSIS — N2581 Secondary hyperparathyroidism of renal origin: Secondary | ICD-10-CM | POA: Diagnosis not present

## 2022-06-18 ENCOUNTER — Other Ambulatory Visit: Payer: Self-pay | Admitting: "Endocrinology

## 2022-06-19 DIAGNOSIS — N186 End stage renal disease: Secondary | ICD-10-CM | POA: Diagnosis not present

## 2022-06-19 DIAGNOSIS — D689 Coagulation defect, unspecified: Secondary | ICD-10-CM | POA: Diagnosis not present

## 2022-06-19 DIAGNOSIS — Z992 Dependence on renal dialysis: Secondary | ICD-10-CM | POA: Diagnosis not present

## 2022-06-19 DIAGNOSIS — N2581 Secondary hyperparathyroidism of renal origin: Secondary | ICD-10-CM | POA: Diagnosis not present

## 2022-06-21 NOTE — Progress Notes (Signed)
PCP: Dr Hilma Favors Primary HF Cardiologist: Dr Haroldine Laws  Nephrology: Dr Marval Regal  HPI: Kyle Hicks is a 81 year old with history of HTN, DMII, chronic biventricular heart failure, A flutter, AKI/ESRD, PUD, and GI bleed.    Had ECHO 2018 and EF was 55%.    Admitted to [facility] 10/07/20 with weakness and diarrhea. On arrival EKG showed A flutter RVR. Hospital course complicated by AKI and Acute Biventricular Heart Failure. Echo showed LVEF 10-15% and severely reduced RV.  Attempted to diurese with IV lasix and milrinone but ultimately developed AKI. Placed on HD and was able to tolerate. 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  He failed cardioversion but chemically converted with amiodarone. Nephrology recommended continuing torsemide 100 mg at discharge.   Today he returns for f/u. Feels good. Denies CP or SOB. Remains on HD T, TH, Sat via LUE fistula. Weight stable at 185. BP drops at HD on occasion and gets fluid back.   Echo 03/11/21 EF 40-45% Personally reviewed  Cardiac Testing  Echo 10/08/20 - EF 10-15% RV severely reduced. IVC dilated suggesting RA pressure 15. Echo 2018 EF 55%.    TEE 10/10/20  w/ severe Biv dysfunction, Mod-severe Kyle, severe TR. No LAA thrombus. DCCV unsuccessful   RHC 10/24/20 Ao = 89/52 (68) RA = 10 RV = 45/12 PA = 47/17 (28) PCW = 9 Fick cardiac output/index = 6.6/3.1 PVR =2.9 FA sat = 98% PA sat = 65%, 67%  Assessment: 1. 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 RCA not injected 2. Mild PAH 3. 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   ROS: All systems negative except as listed in HPI, PMH and Problem List.  SH:  Social History   Socioeconomic History   Marital status: Married    Spouse name: Not on file   Number of children: Not on file   Years of education: Not on file   Highest education level: Not on file  Occupational History   Not on file  Tobacco Use    Smoking status: Former    Packs/day: 0.50    Types: Cigarettes    Quit date: 07/26/1987    Years since quitting: 34.9   Smokeless tobacco: Never  Vaping Use   Vaping Use: Never used  Substance and Sexual Activity   Alcohol use: No    Alcohol/week: 0.0 standard drinks of alcohol    Comment: Previously drank socially, last drank 25-30 years ago   Drug use: No   Sexual activity: Not on file  Other Topics Concern   Not on file  Social History Narrative   Not on file   Social Determinants of Health   Financial Resource Strain: Not on file  Food Insecurity: No Food Insecurity (11/05/2020)   Hunger Vital Sign    Worried About Running Out of Food in the Last Year: Never true    Ran Out of Food in the Last Year: Never true  Transportation Needs: No Transportation Needs (11/05/2020)   PRAPARE - Hydrologist (Medical): No    Lack of Transportation (Non-Medical): No  Physical Activity: Not on file  Stress: Not on file  Social Connections: Not on file  Intimate Partner Violence: Not on file    FH:  Family History  Problem Relation Age of Onset   Hypertension Mother    Hyperlipidemia Mother    Hypertension Father    Hyperlipidemia Father  Colon cancer Neg Hx     Past Medical History:  Diagnosis Date   Chronic kidney disease    Diabetes mellitus without complication (HCC)    Dialysis patient ([place])    Diastolic CHF, acute ([place]) 10/08/2020   HTN (hypertension)     Current Outpatient Medications  Medication Sig Dispense Refill   apixaban (ELIQUIS) 5 MG TABS tablet Take 1 tablet (5 mg total) by mouth 2 (two) times daily. NEEDS FOLLOW UP APPOINTMENT FOR ANYMORE REFILLS 180 tablet 0   atorvastatin (LIPITOR) 20 MG tablet Take 1 tablet (20 mg total) by mouth daily. NEEDS FOLLOW UP APPOINTMENT FOR ANYMORE REFILLS 90 tablet 0   blood glucose meter kit and supplies KIT Dispense based on patient and insurance preference. Use up to 2 times daily as  directed. (FOR ICD-10 E11.65) 1 each 5   Blood Glucose Monitoring Suppl ([place]) w/Device KIT Use to test blood glucose twice daily. Dx Code: E11.65 1 kit 1   carvedilol (COREG) 3.125 MG tablet Take 1 tablet (3.125 mg total) by mouth 2 (two) times daily. DO NOT take on the mornings of dialysis 60 tablet 11   glipiZIDE (GLUCOTROL) 5 MG tablet Take 1 tablet by mouth once daily with breakfast 90 tablet 0   glucose blood (ONETOUCH VERIO) test strip Use as instructed to check blood glucose twice daily. Dx Code: E11.65 200 each 0   hydrocortisone cream 1 % Apply 1 application topically 3 (three) times daily as needed for itching (minor skin irritation). 30 g 0   [name] CARBONATE PO 500 mg. Patient is taking 500 mg tablet (chewable)3 times daily.     OneTouch Delica Lancets 78E MISC Use to check blood glucose twice daily. Dx Code: E11.65 100 each 1   oxyCODONE-acetaminophen (PERCOCET) 5-325 MG tablet Take 1 tablet by mouth every 6 (six) hours as needed for severe pain. (Patient not taking: Reported on 08/14/2021) 8 tablet 0   PACERONE 100 MG tablet Take 1 tablet by mouth once daily 30 tablet 11   Polyethyl Glycol-Propyl Glycol (SYSTANE) 0.4-0.3 % SOLN Place 1 drop into both eyes daily as needed (Eye irritation).     torsemide (DEMADEX) 100 MG tablet Take 1 tablet by mouth once daily 30 tablet 0   No current facility-administered medications for this visit.    There were no vitals filed for this visit.  Wt Readings from Last 3 Encounters:  02/12/22 87.5 kg (193 lb)  08/14/21 88.5 kg (195 lb)  03/23/21 88.5 kg (195 lb)    PHYSICAL EXAM: General:  Well appearing. No resp difficulty HEENT: normal Neck: supple. no JVD. Carotids 2+ bilat; no bruits. No lymphadenopathy or thryomegaly appreciated. Cor: PMI nondisplaced. Mildly irregular rate & rhythm. No rubs, gallops or murmurs. Lungs: clear Abdomen: soft, nontender, nondistended. No hepatosplenomegaly. No bruits or masses. Good  bowel sounds. Extremities: no cyanosis, clubbing, rash, edema LUE fistula Neuro: alert & orientedx3, cranial nerves grossly intact. moves all 4 extremities w/o difficulty. Affect pleasant   ECG: NSR with PACs 78 Personally reviewed   ASSESSMENT & PLAN:   1. PAF (A Flutter)  RVR - Remains in NSR on on amio 100 daily - Continue eliquis 5 mg twice a day. (Will need to decrease dose to 2.5 bid when he hits 81 y/o) - Check labs today   2. Chronic Biventricular Heart Failure - Had ECHO 2018 and EF 55%. Echo 09/2020 EF < 15%. Suspect tachy-mediated.  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 RCA not injected.  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 Volume managed by HD. Diuretics per renal - Will not add arb/dig/spiro with ESRD  - Will add carvedilol 3.125 bid (hold on mornings of HD). I d/w Dr. Marval Regal by phone who agrees. Will see how he tolerates.    3. 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 ESRD -Started HD in April and has been tolerating without difficulty. -On torsemide per nephrology.    5. DM2 - Hgb A1C 5.9  - On lantus/SSI   6. 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  -Continue PPI   Rafael Bihari, FNP  4:39 PM

## 2022-06-22 DIAGNOSIS — Z992 Dependence on renal dialysis: Secondary | ICD-10-CM | POA: Diagnosis not present

## 2022-06-22 DIAGNOSIS — N186 End stage renal disease: Secondary | ICD-10-CM | POA: Diagnosis not present

## 2022-06-22 DIAGNOSIS — N2581 Secondary hyperparathyroidism of renal origin: Secondary | ICD-10-CM | POA: Diagnosis not present

## 2022-06-22 DIAGNOSIS — D689 Coagulation defect, unspecified: Secondary | ICD-10-CM | POA: Diagnosis not present

## 2022-06-23 ENCOUNTER — Ambulatory Visit (HOSPITAL_COMMUNITY)
Admission: RE | Admit: 2022-06-23 | Discharge: 2022-06-23 | Disposition: A | Discharge status: Home / Self Care | Payer: Medicare HMO | Source: Ambulatory Visit | Attending: Family Medicine | Admitting: Family Medicine

## 2022-06-23 ENCOUNTER — Encounter (HOSPITAL_COMMUNITY): Payer: Self-pay

## 2022-06-23 VITALS — BP 82/46 | HR 73 | Wt 200.0 lb

## 2022-06-23 DIAGNOSIS — I480 Paroxysmal atrial fibrillation: Secondary | ICD-10-CM | POA: Insufficient documentation

## 2022-06-23 DIAGNOSIS — I132 Hypertensive heart and chronic kidney disease with heart failure and with stage 5 chronic kidney disease, or end stage renal disease: Secondary | ICD-10-CM | POA: Insufficient documentation

## 2022-06-23 DIAGNOSIS — I5082 Biventricular heart failure: Secondary | ICD-10-CM | POA: Diagnosis not present

## 2022-06-23 DIAGNOSIS — Z79899 Other long term (current) drug therapy: Secondary | ICD-10-CM | POA: Diagnosis not present

## 2022-06-23 DIAGNOSIS — Z7984 Long term (current) use of oral hypoglycemic drugs: Secondary | ICD-10-CM | POA: Diagnosis not present

## 2022-06-23 DIAGNOSIS — Z8249 Family history of ischemic heart disease and other diseases of the circulatory system: Secondary | ICD-10-CM | POA: Diagnosis not present

## 2022-06-23 DIAGNOSIS — Z794 Long term (current) use of insulin: Secondary | ICD-10-CM | POA: Diagnosis not present

## 2022-06-23 DIAGNOSIS — Z7901 Long term (current) use of anticoagulants: Secondary | ICD-10-CM | POA: Diagnosis not present

## 2022-06-23 DIAGNOSIS — Z8711 Personal history of peptic ulcer disease: Secondary | ICD-10-CM | POA: Insufficient documentation

## 2022-06-23 DIAGNOSIS — I5022 Chronic systolic (congestive) heart failure: Secondary | ICD-10-CM | POA: Diagnosis not present

## 2022-06-23 DIAGNOSIS — K279 Peptic ulcer, site unspecified, unspecified as acute or chronic, without hemorrhage or perforation: Secondary | ICD-10-CM

## 2022-06-23 DIAGNOSIS — Z992 Dependence on renal dialysis: Secondary | ICD-10-CM

## 2022-06-23 DIAGNOSIS — I4892 Unspecified atrial flutter: Secondary | ICD-10-CM | POA: Insufficient documentation

## 2022-06-23 DIAGNOSIS — Z87891 Personal history of nicotine dependence: Secondary | ICD-10-CM | POA: Diagnosis not present

## 2022-06-23 DIAGNOSIS — N186 End stage renal disease: Secondary | ICD-10-CM | POA: Insufficient documentation

## 2022-06-23 DIAGNOSIS — I2510 Atherosclerotic heart disease of native coronary artery without angina pectoris: Secondary | ICD-10-CM | POA: Diagnosis not present

## 2022-06-23 DIAGNOSIS — E1122 Type 2 diabetes mellitus with diabetic chronic kidney disease: Secondary | ICD-10-CM | POA: Insufficient documentation

## 2022-06-23 LAB — HEPATIC FUNCTION PANEL
ALT: 20 U/L (ref 0–44)
AST: 18 U/L (ref 15–41)
Albumin: 3.7 g/dL (ref 3.5–5.0)
Alkaline Phosphatase: 45 U/L (ref 38–126)
Bilirubin, Direct: <0.1 mg/dL (ref 0.0–0.2)
Total Bilirubin: 0.5 mg/dL (ref 0.3–1.2)
Total Protein: 7.1 g/dL (ref 6.5–8.1)

## 2022-06-23 LAB — CBC
HCT: 40.6 % (ref 39.0–52.0)
Hemoglobin: 13.3 g/dL (ref 13.0–17.0)
MCH: 32.6 pg (ref 26.0–34.0)
MCHC: 32.8 g/dL (ref 30.0–36.0)
MCV: 99.5 fL (ref 80.0–100.0)
Platelets: 159 10*3/uL (ref 150–400)
RBC: 4.08 MIL/uL — ABNORMAL LOW (ref 4.22–5.81)
RDW: 14 % (ref 11.5–15.5)
WBC: 7 10*3/uL (ref 4.0–10.5)
nRBC: 0 % (ref 0.0–0.2)

## 2022-06-23 LAB — TSH: TSH: 1.172 u[IU]/mL (ref 0.350–4.500)

## 2022-06-23 MED ORDER — APIXABAN 5 MG PO TABS: 5.0000 mg | ORAL_TABLET | Freq: Two times a day (BID) | ORAL | 3 refills | Status: DC

## 2022-06-23 MED ORDER — MIDODRINE HCL 10 MG PO TABS: 10.0000 mg | ORAL_TABLET | ORAL | 3 refills | Status: DC

## 2022-06-23 MED ORDER — TORSEMIDE 100 MG PO TABS: 100.0000 mg | ORAL_TABLET | ORAL | 3 refills | Status: DC

## 2022-06-23 MED ORDER — CARVEDILOL 3.125 MG PO TABS: 3.1250 mg | ORAL_TABLET | Freq: Two times a day (BID) | ORAL | 11 refills | Status: DC

## 2022-06-23 MED ORDER — AMIODARONE HCL 100 MG PO TABS: 100.0000 mg | ORAL_TABLET | Freq: Every day | ORAL | 11 refills | Status: DC

## 2022-06-23 MED ORDER — ATORVASTATIN CALCIUM 20 MG PO TABS: 20.0000 mg | ORAL_TABLET | Freq: Every day | ORAL | 3 refills | Status: DC

## 2022-06-23 NOTE — Patient Instructions (Signed)
It was great to see you today! No medication changes are needed at this time.  Labs today We will only contact you if something comes back abnormal or we need to make some changes. Otherwise no news is good news!  Your physician wants you to follow-up in: 6 months with Dr Haroldine Laws and echo. You will receive a reminder letter in the mail two months in advance. If you don't receive a letter, please call our office to schedule the follow-up appointment.  Your physician has requested that you have an echocardiogram. Echocardiography is a painless test that uses sound waves to create images of your heart. It provides your doctor with information about the size and shape of your heart and how well your heart's chambers and valves are working. This procedure takes approximately one hour. There are no restrictions for this procedure. Please do NOT wear cologne, perfume, aftershave, or lotions (deodorant is allowed). Please arrive 15 minutes prior to your appointment time.  Do the following things EVERYDAY: Weigh yourself in the morning before breakfast. Write it down and keep it in a log. Take your medicines as prescribed Eat low salt foods--Limit salt (sodium) to 2000 mg per day.  Stay as active as you can everyday Limit all fluids for the day to less than 2 liters  At the [facility], you and your health needs are our priority. As part of our continuing mission to provide you with exceptional heart care, we have created designated Provider Care Teams. These Care Teams include your primary Cardiologist (physician) and Advanced Practice Providers (APPs- Physician Assistants and Nurse Practitioners) who all work together to provide you with the care you need, when you need it.   You may see any of the following providers on your designated Care Team at your next follow up: Dr Glori Bickers Dr Loralie Champagne Dr. Roxana Hires, NP Lyda Jester, [place] [facility] [place], [place] Forestine Na, NP Audry Riles, PharmD   Please be sure to bring in all your medications bottles to every appointment.   If you have any questions or concerns before your next appointment please send [place] a message through [place] or call our office at [phone].    TO LEAVE A MESSAGE FOR THE NURSE SELECT OPTION 2, PLEASE LEAVE A MESSAGE INCLUDING: YOUR NAME DATE OF BIRTH CALL BACK NUMBER REASON FOR CALL**this is important as we prioritize the call backs  YOU WILL RECEIVE A CALL BACK THE SAME DAY AS LONG AS YOU CALL BEFORE 4:00 PM

## 2022-06-24 ENCOUNTER — Encounter (HOSPITAL_COMMUNITY): Payer: Medicare HMO

## 2022-06-24 DIAGNOSIS — D689 Coagulation defect, unspecified: Secondary | ICD-10-CM | POA: Diagnosis not present

## 2022-06-24 DIAGNOSIS — Z992 Dependence on renal dialysis: Secondary | ICD-10-CM | POA: Diagnosis not present

## 2022-06-24 DIAGNOSIS — N186 End stage renal disease: Secondary | ICD-10-CM | POA: Diagnosis not present

## 2022-06-24 DIAGNOSIS — N2581 Secondary hyperparathyroidism of renal origin: Secondary | ICD-10-CM | POA: Diagnosis not present

## 2022-06-24 DIAGNOSIS — I150 Renovascular hypertension: Secondary | ICD-10-CM | POA: Diagnosis not present

## 2022-06-26 DIAGNOSIS — N2581 Secondary hyperparathyroidism of renal origin: Secondary | ICD-10-CM | POA: Diagnosis not present

## 2022-06-26 DIAGNOSIS — Z992 Dependence on renal dialysis: Secondary | ICD-10-CM | POA: Diagnosis not present

## 2022-06-26 DIAGNOSIS — D689 Coagulation defect, unspecified: Secondary | ICD-10-CM | POA: Diagnosis not present

## 2022-06-26 DIAGNOSIS — N186 End stage renal disease: Secondary | ICD-10-CM | POA: Diagnosis not present

## 2022-06-29 DIAGNOSIS — N2581 Secondary hyperparathyroidism of renal origin: Secondary | ICD-10-CM | POA: Diagnosis not present

## 2022-06-29 DIAGNOSIS — D689 Coagulation defect, unspecified: Secondary | ICD-10-CM | POA: Diagnosis not present

## 2022-06-29 DIAGNOSIS — Z992 Dependence on renal dialysis: Secondary | ICD-10-CM | POA: Diagnosis not present

## 2022-06-29 DIAGNOSIS — N186 End stage renal disease: Secondary | ICD-10-CM | POA: Diagnosis not present

## 2022-07-01 DIAGNOSIS — Z992 Dependence on renal dialysis: Secondary | ICD-10-CM | POA: Diagnosis not present

## 2022-07-01 DIAGNOSIS — D689 Coagulation defect, unspecified: Secondary | ICD-10-CM | POA: Diagnosis not present

## 2022-07-01 DIAGNOSIS — N2581 Secondary hyperparathyroidism of renal origin: Secondary | ICD-10-CM | POA: Diagnosis not present

## 2022-07-01 DIAGNOSIS — N186 End stage renal disease: Secondary | ICD-10-CM | POA: Diagnosis not present

## 2022-07-03 DIAGNOSIS — D689 Coagulation defect, unspecified: Secondary | ICD-10-CM | POA: Diagnosis not present

## 2022-07-03 DIAGNOSIS — N186 End stage renal disease: Secondary | ICD-10-CM | POA: Diagnosis not present

## 2022-07-03 DIAGNOSIS — Z992 Dependence on renal dialysis: Secondary | ICD-10-CM | POA: Diagnosis not present

## 2022-07-03 DIAGNOSIS — N2581 Secondary hyperparathyroidism of renal origin: Secondary | ICD-10-CM | POA: Diagnosis not present

## 2022-07-05 ENCOUNTER — Ambulatory Visit: Payer: Self-pay | Admitting: *Deleted

## 2022-07-05 DIAGNOSIS — Z748 Other problems related to care provider dependency: Secondary | ICD-10-CM

## 2022-07-05 NOTE — Patient Outreach (Signed)
  Care Coordination   Initial Visit Note   07/05/2022 Name: Kyle Hicks MRN: [id number] DOB: [birth year]  Kyle Hicks is a 81 y.o. year old male who sees Kyle Sites, MD for primary care. I  Spoke with wife, Kyle Hicks, by telephone today.  What matters to the patients health and wellness today?  Transportation Assistance    Goals Addressed             This Visit's Progress    Care Coordination Services       Care Coordination Interventions: Reviewed medications with patient and discussed access & affordability Care Guide referral for transportation assistance. Patient has difficulty with transportation to appointments outside of [facility]. At present he has family that tries to assist but they are not always available. Would like to see if there is any transportation assistance available to have on hand as needed. Discussed plans with patient for ongoing care management follow up and provided patient with direct contact information for care management team Assessed social determinant of health barriers Provided patient/caregiver with information regarding Care Coordination Services: Services include support from the care team which includes your Nurse Coordinator, Clinical Social Worker, or Pharmacist.  The Care Coordination team is here to help remove barriers to the health concerns and goals most important to you. Care Coordination services are voluntary, and the patient may decline or stop services at any time by request to their care team member.  Patient agreed to services Provided with [facility] direct contact number [phone] and encouraged to reach out as needed Telephone f/u scheduled with [facility] 08/05/2022        SDOH assessments and interventions completed:  Yes  SDOH Interventions Today    Flowsheet Row Most Recent Value  SDOH Interventions   Food Insecurity Interventions Intervention Not Indicated  Housing Interventions Intervention Not Indicated   Transportation Interventions AMB Referral  [Patient does have difficulty with transportation outside of [place] & would like some info on resources to have on hand for those visits]  Utilities Interventions Intervention Not Indicated  Financial Strain Interventions Intervention Not Indicated        Care Coordination Interventions:  Yes, provided   Follow up plan: Follow up call scheduled for 08/05/22    Encounter Outcome:  Pt. Visit Completed   Chong Sicilian, BSN, RN-BC RN Care Coordinator [place]: [phone] Main #: [phone]

## 2022-07-06 ENCOUNTER — Telehealth: Payer: Self-pay | Admitting: *Deleted

## 2022-07-06 DIAGNOSIS — D689 Coagulation defect, unspecified: Secondary | ICD-10-CM | POA: Diagnosis not present

## 2022-07-06 DIAGNOSIS — Z992 Dependence on renal dialysis: Secondary | ICD-10-CM | POA: Diagnosis not present

## 2022-07-06 DIAGNOSIS — N2581 Secondary hyperparathyroidism of renal origin: Secondary | ICD-10-CM | POA: Diagnosis not present

## 2022-07-06 DIAGNOSIS — N186 End stage renal disease: Secondary | ICD-10-CM | POA: Diagnosis not present

## 2022-07-06 NOTE — Telephone Encounter (Signed)
   Telephone encounter was:  Successful.  07/06/2022 Name: Kyle Hicks MRN: [id number] DOB: 1941/04/18  Kyle Hicks is a 81 y.o. year old male who is a primary care patient of Sharilyn Sites, MD . The community resource team was consulted for assistance with Transportation Needs  Provided wife with Transportation information for out of county transportation with a referral to [employer] transportation as well as with the [place] guide performed the following interventions: Patient provided with information about care guide support team and interviewed to confirm resource needs.  Follow Up Plan:  No further follow up planned at this time. The patient has been provided with needed resources.  [place] [phone] [address] , [place] [address] Email : [email or web address]

## 2022-07-08 DIAGNOSIS — N2581 Secondary hyperparathyroidism of renal origin: Secondary | ICD-10-CM | POA: Diagnosis not present

## 2022-07-08 DIAGNOSIS — N186 End stage renal disease: Secondary | ICD-10-CM | POA: Diagnosis not present

## 2022-07-08 DIAGNOSIS — Z992 Dependence on renal dialysis: Secondary | ICD-10-CM | POA: Diagnosis not present

## 2022-07-08 DIAGNOSIS — D689 Coagulation defect, unspecified: Secondary | ICD-10-CM | POA: Diagnosis not present

## 2022-07-10 DIAGNOSIS — Z992 Dependence on renal dialysis: Secondary | ICD-10-CM | POA: Diagnosis not present

## 2022-07-10 DIAGNOSIS — N2581 Secondary hyperparathyroidism of renal origin: Secondary | ICD-10-CM | POA: Diagnosis not present

## 2022-07-10 DIAGNOSIS — N186 End stage renal disease: Secondary | ICD-10-CM | POA: Diagnosis not present

## 2022-07-10 DIAGNOSIS — D689 Coagulation defect, unspecified: Secondary | ICD-10-CM | POA: Diagnosis not present

## 2022-07-13 DIAGNOSIS — Z992 Dependence on renal dialysis: Secondary | ICD-10-CM | POA: Diagnosis not present

## 2022-07-13 DIAGNOSIS — D689 Coagulation defect, unspecified: Secondary | ICD-10-CM | POA: Diagnosis not present

## 2022-07-13 DIAGNOSIS — N2581 Secondary hyperparathyroidism of renal origin: Secondary | ICD-10-CM | POA: Diagnosis not present

## 2022-07-13 DIAGNOSIS — N186 End stage renal disease: Secondary | ICD-10-CM | POA: Diagnosis not present

## 2022-07-13 DIAGNOSIS — E119 Type 2 diabetes mellitus without complications: Secondary | ICD-10-CM | POA: Diagnosis not present

## 2022-07-15 DIAGNOSIS — Z992 Dependence on renal dialysis: Secondary | ICD-10-CM | POA: Diagnosis not present

## 2022-07-15 DIAGNOSIS — N186 End stage renal disease: Secondary | ICD-10-CM | POA: Diagnosis not present

## 2022-07-15 DIAGNOSIS — E119 Type 2 diabetes mellitus without complications: Secondary | ICD-10-CM | POA: Diagnosis not present

## 2022-07-15 DIAGNOSIS — N2581 Secondary hyperparathyroidism of renal origin: Secondary | ICD-10-CM | POA: Diagnosis not present

## 2022-07-15 DIAGNOSIS — D689 Coagulation defect, unspecified: Secondary | ICD-10-CM | POA: Diagnosis not present

## 2022-07-17 DIAGNOSIS — Z992 Dependence on renal dialysis: Secondary | ICD-10-CM | POA: Diagnosis not present

## 2022-07-17 DIAGNOSIS — E119 Type 2 diabetes mellitus without complications: Secondary | ICD-10-CM | POA: Diagnosis not present

## 2022-07-17 DIAGNOSIS — N2581 Secondary hyperparathyroidism of renal origin: Secondary | ICD-10-CM | POA: Diagnosis not present

## 2022-07-17 DIAGNOSIS — N186 End stage renal disease: Secondary | ICD-10-CM | POA: Diagnosis not present

## 2022-07-17 DIAGNOSIS — D689 Coagulation defect, unspecified: Secondary | ICD-10-CM | POA: Diagnosis not present

## 2022-07-20 DIAGNOSIS — N2581 Secondary hyperparathyroidism of renal origin: Secondary | ICD-10-CM | POA: Diagnosis not present

## 2022-07-20 DIAGNOSIS — Z992 Dependence on renal dialysis: Secondary | ICD-10-CM | POA: Diagnosis not present

## 2022-07-20 DIAGNOSIS — N186 End stage renal disease: Secondary | ICD-10-CM | POA: Diagnosis not present

## 2022-07-20 DIAGNOSIS — D689 Coagulation defect, unspecified: Secondary | ICD-10-CM | POA: Diagnosis not present

## 2022-07-22 DIAGNOSIS — D689 Coagulation defect, unspecified: Secondary | ICD-10-CM | POA: Diagnosis not present

## 2022-07-22 DIAGNOSIS — Z992 Dependence on renal dialysis: Secondary | ICD-10-CM | POA: Diagnosis not present

## 2022-07-22 DIAGNOSIS — N186 End stage renal disease: Secondary | ICD-10-CM | POA: Diagnosis not present

## 2022-07-22 DIAGNOSIS — N2581 Secondary hyperparathyroidism of renal origin: Secondary | ICD-10-CM | POA: Diagnosis not present

## 2022-07-24 DIAGNOSIS — D689 Coagulation defect, unspecified: Secondary | ICD-10-CM | POA: Diagnosis not present

## 2022-07-24 DIAGNOSIS — Z992 Dependence on renal dialysis: Secondary | ICD-10-CM | POA: Diagnosis not present

## 2022-07-24 DIAGNOSIS — N2581 Secondary hyperparathyroidism of renal origin: Secondary | ICD-10-CM | POA: Diagnosis not present

## 2022-07-24 DIAGNOSIS — N186 End stage renal disease: Secondary | ICD-10-CM | POA: Diagnosis not present

## 2022-07-25 DIAGNOSIS — N186 End stage renal disease: Secondary | ICD-10-CM | POA: Diagnosis not present

## 2022-07-25 DIAGNOSIS — I150 Renovascular hypertension: Secondary | ICD-10-CM | POA: Diagnosis not present

## 2022-07-25 DIAGNOSIS — Z992 Dependence on renal dialysis: Secondary | ICD-10-CM | POA: Diagnosis not present

## 2022-07-27 DIAGNOSIS — N2581 Secondary hyperparathyroidism of renal origin: Secondary | ICD-10-CM | POA: Diagnosis not present

## 2022-07-27 DIAGNOSIS — E119 Type 2 diabetes mellitus without complications: Secondary | ICD-10-CM | POA: Diagnosis not present

## 2022-07-27 DIAGNOSIS — Z992 Dependence on renal dialysis: Secondary | ICD-10-CM | POA: Diagnosis not present

## 2022-07-27 DIAGNOSIS — D689 Coagulation defect, unspecified: Secondary | ICD-10-CM | POA: Diagnosis not present

## 2022-07-27 DIAGNOSIS — N186 End stage renal disease: Secondary | ICD-10-CM | POA: Diagnosis not present

## 2022-07-29 DIAGNOSIS — Z992 Dependence on renal dialysis: Secondary | ICD-10-CM | POA: Diagnosis not present

## 2022-07-29 DIAGNOSIS — N2581 Secondary hyperparathyroidism of renal origin: Secondary | ICD-10-CM | POA: Diagnosis not present

## 2022-07-29 DIAGNOSIS — E119 Type 2 diabetes mellitus without complications: Secondary | ICD-10-CM | POA: Diagnosis not present

## 2022-07-29 DIAGNOSIS — N186 End stage renal disease: Secondary | ICD-10-CM | POA: Diagnosis not present

## 2022-07-29 DIAGNOSIS — D689 Coagulation defect, unspecified: Secondary | ICD-10-CM | POA: Diagnosis not present

## 2022-07-31 DIAGNOSIS — E119 Type 2 diabetes mellitus without complications: Secondary | ICD-10-CM | POA: Diagnosis not present

## 2022-07-31 DIAGNOSIS — Z992 Dependence on renal dialysis: Secondary | ICD-10-CM | POA: Diagnosis not present

## 2022-07-31 DIAGNOSIS — D689 Coagulation defect, unspecified: Secondary | ICD-10-CM | POA: Diagnosis not present

## 2022-07-31 DIAGNOSIS — N2581 Secondary hyperparathyroidism of renal origin: Secondary | ICD-10-CM | POA: Diagnosis not present

## 2022-07-31 DIAGNOSIS — N186 End stage renal disease: Secondary | ICD-10-CM | POA: Diagnosis not present

## 2022-08-03 DIAGNOSIS — D689 Coagulation defect, unspecified: Secondary | ICD-10-CM | POA: Diagnosis not present

## 2022-08-03 DIAGNOSIS — N2581 Secondary hyperparathyroidism of renal origin: Secondary | ICD-10-CM | POA: Diagnosis not present

## 2022-08-03 DIAGNOSIS — N186 End stage renal disease: Secondary | ICD-10-CM | POA: Diagnosis not present

## 2022-08-03 DIAGNOSIS — Z992 Dependence on renal dialysis: Secondary | ICD-10-CM | POA: Diagnosis not present

## 2022-08-03 DIAGNOSIS — E119 Type 2 diabetes mellitus without complications: Secondary | ICD-10-CM | POA: Diagnosis not present

## 2022-08-05 ENCOUNTER — Ambulatory Visit: Payer: Self-pay | Admitting: *Deleted

## 2022-08-05 DIAGNOSIS — D689 Coagulation defect, unspecified: Secondary | ICD-10-CM | POA: Diagnosis not present

## 2022-08-05 DIAGNOSIS — N2581 Secondary hyperparathyroidism of renal origin: Secondary | ICD-10-CM | POA: Diagnosis not present

## 2022-08-05 DIAGNOSIS — Z992 Dependence on renal dialysis: Secondary | ICD-10-CM | POA: Diagnosis not present

## 2022-08-05 DIAGNOSIS — E119 Type 2 diabetes mellitus without complications: Secondary | ICD-10-CM | POA: Diagnosis not present

## 2022-08-05 DIAGNOSIS — N186 End stage renal disease: Secondary | ICD-10-CM | POA: Diagnosis not present

## 2022-08-05 NOTE — Patient Outreach (Signed)
  Care Coordination   08/05/2022 Name: Kyle Hicks MRN: [id number] DOB: 01-18-1941   Care Coordination Outreach Attempts:  An unsuccessful telephone outreach was attempted for a scheduled appointment today. #2  Follow Up Plan:  Additional outreach attempts will be made to offer the patient care coordination information and services.   Encounter Outcome:  No Answer   Care Coordination Interventions:  No, not indicated    Chong Sicilian, BSN, RN-BC RN Care Coordinator [place]: [phone] Main #: [phone]

## 2022-08-07 DIAGNOSIS — N2581 Secondary hyperparathyroidism of renal origin: Secondary | ICD-10-CM | POA: Diagnosis not present

## 2022-08-07 DIAGNOSIS — E119 Type 2 diabetes mellitus without complications: Secondary | ICD-10-CM | POA: Diagnosis not present

## 2022-08-07 DIAGNOSIS — D689 Coagulation defect, unspecified: Secondary | ICD-10-CM | POA: Diagnosis not present

## 2022-08-07 DIAGNOSIS — N186 End stage renal disease: Secondary | ICD-10-CM | POA: Diagnosis not present

## 2022-08-07 DIAGNOSIS — Z992 Dependence on renal dialysis: Secondary | ICD-10-CM | POA: Diagnosis not present

## 2022-08-10 DIAGNOSIS — E119 Type 2 diabetes mellitus without complications: Secondary | ICD-10-CM | POA: Diagnosis not present

## 2022-08-10 DIAGNOSIS — N186 End stage renal disease: Secondary | ICD-10-CM | POA: Diagnosis not present

## 2022-08-10 DIAGNOSIS — Z992 Dependence on renal dialysis: Secondary | ICD-10-CM | POA: Diagnosis not present

## 2022-08-10 DIAGNOSIS — D689 Coagulation defect, unspecified: Secondary | ICD-10-CM | POA: Diagnosis not present

## 2022-08-10 DIAGNOSIS — N2581 Secondary hyperparathyroidism of renal origin: Secondary | ICD-10-CM | POA: Diagnosis not present

## 2022-08-12 DIAGNOSIS — N186 End stage renal disease: Secondary | ICD-10-CM | POA: Diagnosis not present

## 2022-08-12 DIAGNOSIS — E119 Type 2 diabetes mellitus without complications: Secondary | ICD-10-CM | POA: Diagnosis not present

## 2022-08-12 DIAGNOSIS — Z992 Dependence on renal dialysis: Secondary | ICD-10-CM | POA: Diagnosis not present

## 2022-08-12 DIAGNOSIS — N2581 Secondary hyperparathyroidism of renal origin: Secondary | ICD-10-CM | POA: Diagnosis not present

## 2022-08-12 DIAGNOSIS — D689 Coagulation defect, unspecified: Secondary | ICD-10-CM | POA: Diagnosis not present

## 2022-08-13 DIAGNOSIS — T82868A Thrombosis of vascular prosthetic devices, implants and grafts, initial encounter: Secondary | ICD-10-CM | POA: Diagnosis not present

## 2022-08-13 DIAGNOSIS — Z992 Dependence on renal dialysis: Secondary | ICD-10-CM | POA: Diagnosis not present

## 2022-08-13 DIAGNOSIS — N186 End stage renal disease: Secondary | ICD-10-CM | POA: Diagnosis not present

## 2022-08-14 DIAGNOSIS — Z992 Dependence on renal dialysis: Secondary | ICD-10-CM | POA: Diagnosis not present

## 2022-08-14 DIAGNOSIS — N186 End stage renal disease: Secondary | ICD-10-CM | POA: Diagnosis not present

## 2022-08-14 DIAGNOSIS — E119 Type 2 diabetes mellitus without complications: Secondary | ICD-10-CM | POA: Diagnosis not present

## 2022-08-14 DIAGNOSIS — D689 Coagulation defect, unspecified: Secondary | ICD-10-CM | POA: Diagnosis not present

## 2022-08-14 DIAGNOSIS — N2581 Secondary hyperparathyroidism of renal origin: Secondary | ICD-10-CM | POA: Diagnosis not present

## 2022-08-17 ENCOUNTER — Telehealth: Payer: Self-pay | Admitting: "Endocrinology

## 2022-08-17 DIAGNOSIS — E119 Type 2 diabetes mellitus without complications: Secondary | ICD-10-CM | POA: Diagnosis not present

## 2022-08-17 DIAGNOSIS — N2581 Secondary hyperparathyroidism of renal origin: Secondary | ICD-10-CM | POA: Diagnosis not present

## 2022-08-17 DIAGNOSIS — E669 Obesity, unspecified: Secondary | ICD-10-CM

## 2022-08-17 DIAGNOSIS — E782 Mixed hyperlipidemia: Secondary | ICD-10-CM

## 2022-08-17 DIAGNOSIS — E1122 Type 2 diabetes mellitus with diabetic chronic kidney disease: Secondary | ICD-10-CM

## 2022-08-17 DIAGNOSIS — N186 End stage renal disease: Secondary | ICD-10-CM | POA: Diagnosis not present

## 2022-08-17 DIAGNOSIS — D689 Coagulation defect, unspecified: Secondary | ICD-10-CM | POA: Diagnosis not present

## 2022-08-17 DIAGNOSIS — Z992 Dependence on renal dialysis: Secondary | ICD-10-CM | POA: Diagnosis not present

## 2022-08-17 NOTE — Telephone Encounter (Signed)
Pt needs updated labs for [employer]

## 2022-08-17 NOTE — Telephone Encounter (Signed)
New order in chart 

## 2022-08-18 ENCOUNTER — Ambulatory Visit: Payer: Medicare HMO | Admitting: "Endocrinology

## 2022-08-19 DIAGNOSIS — E119 Type 2 diabetes mellitus without complications: Secondary | ICD-10-CM | POA: Diagnosis not present

## 2022-08-19 DIAGNOSIS — N2581 Secondary hyperparathyroidism of renal origin: Secondary | ICD-10-CM | POA: Diagnosis not present

## 2022-08-19 DIAGNOSIS — Z992 Dependence on renal dialysis: Secondary | ICD-10-CM | POA: Diagnosis not present

## 2022-08-19 DIAGNOSIS — D689 Coagulation defect, unspecified: Secondary | ICD-10-CM | POA: Diagnosis not present

## 2022-08-19 DIAGNOSIS — N186 End stage renal disease: Secondary | ICD-10-CM | POA: Diagnosis not present

## 2022-08-21 DIAGNOSIS — N2581 Secondary hyperparathyroidism of renal origin: Secondary | ICD-10-CM | POA: Diagnosis not present

## 2022-08-21 DIAGNOSIS — D689 Coagulation defect, unspecified: Secondary | ICD-10-CM | POA: Diagnosis not present

## 2022-08-21 DIAGNOSIS — Z992 Dependence on renal dialysis: Secondary | ICD-10-CM | POA: Diagnosis not present

## 2022-08-21 DIAGNOSIS — E119 Type 2 diabetes mellitus without complications: Secondary | ICD-10-CM | POA: Diagnosis not present

## 2022-08-21 DIAGNOSIS — N186 End stage renal disease: Secondary | ICD-10-CM | POA: Diagnosis not present

## 2022-08-24 DIAGNOSIS — N186 End stage renal disease: Secondary | ICD-10-CM | POA: Diagnosis not present

## 2022-08-24 DIAGNOSIS — D689 Coagulation defect, unspecified: Secondary | ICD-10-CM | POA: Diagnosis not present

## 2022-08-24 DIAGNOSIS — Z992 Dependence on renal dialysis: Secondary | ICD-10-CM | POA: Diagnosis not present

## 2022-08-24 DIAGNOSIS — E119 Type 2 diabetes mellitus without complications: Secondary | ICD-10-CM | POA: Diagnosis not present

## 2022-08-24 DIAGNOSIS — N2581 Secondary hyperparathyroidism of renal origin: Secondary | ICD-10-CM | POA: Diagnosis not present

## 2022-08-25 DIAGNOSIS — Z992 Dependence on renal dialysis: Secondary | ICD-10-CM | POA: Diagnosis not present

## 2022-08-25 DIAGNOSIS — N186 End stage renal disease: Secondary | ICD-10-CM | POA: Diagnosis not present

## 2022-08-25 DIAGNOSIS — I150 Renovascular hypertension: Secondary | ICD-10-CM | POA: Diagnosis not present

## 2022-08-26 DIAGNOSIS — D689 Coagulation defect, unspecified: Secondary | ICD-10-CM | POA: Diagnosis not present

## 2022-08-26 DIAGNOSIS — Z992 Dependence on renal dialysis: Secondary | ICD-10-CM | POA: Diagnosis not present

## 2022-08-26 DIAGNOSIS — N186 End stage renal disease: Secondary | ICD-10-CM | POA: Diagnosis not present

## 2022-08-26 DIAGNOSIS — E119 Type 2 diabetes mellitus without complications: Secondary | ICD-10-CM | POA: Diagnosis not present

## 2022-08-26 DIAGNOSIS — N2581 Secondary hyperparathyroidism of renal origin: Secondary | ICD-10-CM | POA: Diagnosis not present

## 2022-08-28 DIAGNOSIS — D689 Coagulation defect, unspecified: Secondary | ICD-10-CM | POA: Diagnosis not present

## 2022-08-28 DIAGNOSIS — E119 Type 2 diabetes mellitus without complications: Secondary | ICD-10-CM | POA: Diagnosis not present

## 2022-08-28 DIAGNOSIS — N186 End stage renal disease: Secondary | ICD-10-CM | POA: Diagnosis not present

## 2022-08-28 DIAGNOSIS — N2581 Secondary hyperparathyroidism of renal origin: Secondary | ICD-10-CM | POA: Diagnosis not present

## 2022-08-28 DIAGNOSIS — Z992 Dependence on renal dialysis: Secondary | ICD-10-CM | POA: Diagnosis not present

## 2022-08-31 DIAGNOSIS — N2581 Secondary hyperparathyroidism of renal origin: Secondary | ICD-10-CM | POA: Diagnosis not present

## 2022-08-31 DIAGNOSIS — N186 End stage renal disease: Secondary | ICD-10-CM | POA: Diagnosis not present

## 2022-08-31 DIAGNOSIS — Z992 Dependence on renal dialysis: Secondary | ICD-10-CM | POA: Diagnosis not present

## 2022-08-31 DIAGNOSIS — D689 Coagulation defect, unspecified: Secondary | ICD-10-CM | POA: Diagnosis not present

## 2022-08-31 DIAGNOSIS — E119 Type 2 diabetes mellitus without complications: Secondary | ICD-10-CM | POA: Diagnosis not present

## 2022-09-02 DIAGNOSIS — N2581 Secondary hyperparathyroidism of renal origin: Secondary | ICD-10-CM | POA: Diagnosis not present

## 2022-09-02 DIAGNOSIS — E119 Type 2 diabetes mellitus without complications: Secondary | ICD-10-CM | POA: Diagnosis not present

## 2022-09-02 DIAGNOSIS — Z992 Dependence on renal dialysis: Secondary | ICD-10-CM | POA: Diagnosis not present

## 2022-09-02 DIAGNOSIS — D689 Coagulation defect, unspecified: Secondary | ICD-10-CM | POA: Diagnosis not present

## 2022-09-02 DIAGNOSIS — N186 End stage renal disease: Secondary | ICD-10-CM | POA: Diagnosis not present

## 2022-09-04 DIAGNOSIS — E119 Type 2 diabetes mellitus without complications: Secondary | ICD-10-CM | POA: Diagnosis not present

## 2022-09-04 DIAGNOSIS — N2581 Secondary hyperparathyroidism of renal origin: Secondary | ICD-10-CM | POA: Diagnosis not present

## 2022-09-04 DIAGNOSIS — N186 End stage renal disease: Secondary | ICD-10-CM | POA: Diagnosis not present

## 2022-09-04 DIAGNOSIS — Z992 Dependence on renal dialysis: Secondary | ICD-10-CM | POA: Diagnosis not present

## 2022-09-04 DIAGNOSIS — D689 Coagulation defect, unspecified: Secondary | ICD-10-CM | POA: Diagnosis not present

## 2022-09-07 DIAGNOSIS — N186 End stage renal disease: Secondary | ICD-10-CM | POA: Diagnosis not present

## 2022-09-07 DIAGNOSIS — N2581 Secondary hyperparathyroidism of renal origin: Secondary | ICD-10-CM | POA: Diagnosis not present

## 2022-09-07 DIAGNOSIS — D689 Coagulation defect, unspecified: Secondary | ICD-10-CM | POA: Diagnosis not present

## 2022-09-07 DIAGNOSIS — E119 Type 2 diabetes mellitus without complications: Secondary | ICD-10-CM | POA: Diagnosis not present

## 2022-09-07 DIAGNOSIS — Z992 Dependence on renal dialysis: Secondary | ICD-10-CM | POA: Diagnosis not present

## 2022-09-09 DIAGNOSIS — Z992 Dependence on renal dialysis: Secondary | ICD-10-CM | POA: Diagnosis not present

## 2022-09-09 DIAGNOSIS — N186 End stage renal disease: Secondary | ICD-10-CM | POA: Diagnosis not present

## 2022-09-09 DIAGNOSIS — D689 Coagulation defect, unspecified: Secondary | ICD-10-CM | POA: Diagnosis not present

## 2022-09-09 DIAGNOSIS — E119 Type 2 diabetes mellitus without complications: Secondary | ICD-10-CM | POA: Diagnosis not present

## 2022-09-09 DIAGNOSIS — N2581 Secondary hyperparathyroidism of renal origin: Secondary | ICD-10-CM | POA: Diagnosis not present

## 2022-09-11 DIAGNOSIS — D689 Coagulation defect, unspecified: Secondary | ICD-10-CM | POA: Diagnosis not present

## 2022-09-11 DIAGNOSIS — N186 End stage renal disease: Secondary | ICD-10-CM | POA: Diagnosis not present

## 2022-09-11 DIAGNOSIS — E119 Type 2 diabetes mellitus without complications: Secondary | ICD-10-CM | POA: Diagnosis not present

## 2022-09-11 DIAGNOSIS — N2581 Secondary hyperparathyroidism of renal origin: Secondary | ICD-10-CM | POA: Diagnosis not present

## 2022-09-11 DIAGNOSIS — Z992 Dependence on renal dialysis: Secondary | ICD-10-CM | POA: Diagnosis not present

## 2022-09-13 DIAGNOSIS — E782 Mixed hyperlipidemia: Secondary | ICD-10-CM | POA: Diagnosis not present

## 2022-09-13 DIAGNOSIS — E669 Obesity, unspecified: Secondary | ICD-10-CM | POA: Diagnosis not present

## 2022-09-13 DIAGNOSIS — N186 End stage renal disease: Secondary | ICD-10-CM | POA: Diagnosis not present

## 2022-09-13 DIAGNOSIS — E1122 Type 2 diabetes mellitus with diabetic chronic kidney disease: Secondary | ICD-10-CM | POA: Diagnosis not present

## 2022-09-14 DIAGNOSIS — N186 End stage renal disease: Secondary | ICD-10-CM | POA: Diagnosis not present

## 2022-09-14 DIAGNOSIS — E119 Type 2 diabetes mellitus without complications: Secondary | ICD-10-CM | POA: Diagnosis not present

## 2022-09-14 DIAGNOSIS — N2581 Secondary hyperparathyroidism of renal origin: Secondary | ICD-10-CM | POA: Diagnosis not present

## 2022-09-14 DIAGNOSIS — Z992 Dependence on renal dialysis: Secondary | ICD-10-CM | POA: Diagnosis not present

## 2022-09-14 DIAGNOSIS — D689 Coagulation defect, unspecified: Secondary | ICD-10-CM | POA: Diagnosis not present

## 2022-09-14 LAB — T4, FREE: Free T4: 1.76 ng/dL (ref 0.82–1.77)

## 2022-09-14 LAB — LIPID PANEL
Chol/HDL Ratio: 2.2 ratio (ref 0.0–5.0)
Cholesterol, Total: 142 mg/dL (ref 100–199)
HDL: 64 mg/dL (ref >39)
LDL Chol Calc (NIH): 65 mg/dL (ref 0–99)
Triglycerides: 64 mg/dL (ref 0–149)
VLDL Cholesterol Cal: 13 mg/dL (ref 5–40)

## 2022-09-14 LAB — COMPREHENSIVE METABOLIC PANEL
ALT: 11 IU/L (ref 0–44)
AST: 8 IU/L (ref 0–40)
Albumin/Globulin Ratio: 1.4 (ref 1.2–2.2)
Albumin: 4.2 g/dL (ref 3.8–4.8)
Alkaline Phosphatase: 58 IU/L (ref 44–121)
BUN/Creatinine Ratio: 7 — ABNORMAL LOW (ref 10–24)
BUN: 51 mg/dL — ABNORMAL HIGH (ref 8–27)
Bilirubin Total: 0.3 mg/dL (ref 0.0–1.2)
CO2: 25 mmol/L (ref 20–29)
Calcium: 9.5 mg/dL (ref 8.6–10.2)
Chloride: 91 mmol/L — ABNORMAL LOW (ref 96–106)
Creatinine, Ser: 6.82 mg/dL — ABNORMAL HIGH (ref 0.76–1.27)
Globulin, Total: 3 g/dL (ref 1.5–4.5)
Glucose: 119 mg/dL — ABNORMAL HIGH (ref 70–99)
Potassium: 4.1 mmol/L (ref 3.5–5.2)
Sodium: 135 mmol/L (ref 134–144)
Total Protein: 7.2 g/dL (ref 6.0–8.5)
eGFR: 8 mL/min/{1.73_m2} — ABNORMAL LOW (ref >59)

## 2022-09-14 LAB — TSH: TSH: 1.05 u[IU]/mL (ref 0.450–4.500)

## 2022-09-16 DIAGNOSIS — E119 Type 2 diabetes mellitus without complications: Secondary | ICD-10-CM | POA: Diagnosis not present

## 2022-09-16 DIAGNOSIS — Z992 Dependence on renal dialysis: Secondary | ICD-10-CM | POA: Diagnosis not present

## 2022-09-16 DIAGNOSIS — D689 Coagulation defect, unspecified: Secondary | ICD-10-CM | POA: Diagnosis not present

## 2022-09-16 DIAGNOSIS — N2581 Secondary hyperparathyroidism of renal origin: Secondary | ICD-10-CM | POA: Diagnosis not present

## 2022-09-16 DIAGNOSIS — N186 End stage renal disease: Secondary | ICD-10-CM | POA: Diagnosis not present

## 2022-09-18 DIAGNOSIS — D689 Coagulation defect, unspecified: Secondary | ICD-10-CM | POA: Diagnosis not present

## 2022-09-18 DIAGNOSIS — E119 Type 2 diabetes mellitus without complications: Secondary | ICD-10-CM | POA: Diagnosis not present

## 2022-09-18 DIAGNOSIS — Z992 Dependence on renal dialysis: Secondary | ICD-10-CM | POA: Diagnosis not present

## 2022-09-18 DIAGNOSIS — N186 End stage renal disease: Secondary | ICD-10-CM | POA: Diagnosis not present

## 2022-09-18 DIAGNOSIS — N2581 Secondary hyperparathyroidism of renal origin: Secondary | ICD-10-CM | POA: Diagnosis not present

## 2022-09-20 ENCOUNTER — Other Ambulatory Visit: Payer: Self-pay | Admitting: "Endocrinology

## 2022-09-20 ENCOUNTER — Ambulatory Visit (INDEPENDENT_AMBULATORY_CARE_PROVIDER_SITE_OTHER): Payer: Medicare HMO | Admitting: "Endocrinology

## 2022-09-20 ENCOUNTER — Encounter: Payer: Self-pay | Admitting: "Endocrinology

## 2022-09-20 VITALS — BP 74/38 | HR 72 | Ht 66.5 in | Wt 200.6 lb

## 2022-09-20 DIAGNOSIS — E669 Obesity, unspecified: Secondary | ICD-10-CM | POA: Diagnosis not present

## 2022-09-20 DIAGNOSIS — I10 Essential (primary) hypertension: Secondary | ICD-10-CM | POA: Diagnosis not present

## 2022-09-20 DIAGNOSIS — N186 End stage renal disease: Secondary | ICD-10-CM

## 2022-09-20 DIAGNOSIS — E1122 Type 2 diabetes mellitus with diabetic chronic kidney disease: Secondary | ICD-10-CM

## 2022-09-20 DIAGNOSIS — E782 Mixed hyperlipidemia: Secondary | ICD-10-CM

## 2022-09-20 LAB — POCT GLYCOSYLATED HEMOGLOBIN (HGB A1C): HbA1c, POC (controlled diabetic range): 6.8 % (ref 0.0–7.0)

## 2022-09-20 NOTE — Patient Instructions (Signed)

## 2022-09-20 NOTE — Progress Notes (Signed)
09/20/2022  Endocrinology follow-up note   Subjective:    Patient ID: Kyle Hicks, male    DOB: 06-26-1941, PCP Sharilyn Sites, MD   Past Medical History:  Diagnosis Date   Chronic kidney disease    Diabetes mellitus without complication [facility])    Dialysis patient [facility])    Diastolic CHF, acute ([place]) 10/08/2020   HTN (hypertension)    Past Surgical History:  Procedure Laterality Date   AV FISTULA PLACEMENT Left 01/22/2021   Procedure: LEFT UPPER ARM ARTERIOVENOUS GORE-TEX GRAFT;  Surgeon: [name], MD;  Location: [facility] ORS;  Service: Vascular;  Laterality: Left;   CARDIOVERSION N/A 10/10/2020   Procedure: CARDIOVERSION;  Surgeon: Lelon Perla, MD;  Location[facility] ENDOSCOPY;  Service: Cardiovascular;  Laterality: N/A;   ESOPHAGOGASTRODUODENOSCOPY N/A 04/27/2017   Procedure: ESOPHAGOGASTRODUODENOSCOPY (EGD);  Surgeon: Rogene Houston, MD;  Location: [facility] ENDO SUITE;  Service: Endoscopy;  Laterality: N/A;   IR FLUORO GUIDE CV LINE RIGHT  10/23/2020   IR US GUIDANCE  10/23/2020   RIGHT HEART CATH N/A 10/16/2020   Procedure: RIGHT HEART CATH;  Surgeon: Jolaine Artist, MD;  Location: [place] CV LAB;  Service: Cardiovascular;  Laterality: N/A;   RIGHT HEART CATH AND CORONARY ANGIOGRAPHY N/A 10/24/2020   Procedure: RIGHT HEART CATH AND CORONARY ANGIOGRAPHY;  Surgeon: Jolaine Artist, MD;  Location: [place] CV LAB;  Service: Cardiovascular;  Laterality: N/A;   TEE WITHOUT CARDIOVERSION N/A 10/10/2020   Procedure: TRANSESOPHAGEAL ECHOCARDIOGRAM (TEE);  Surgeon: Lelon Perla, MD;  Location: [facility] ENDOSCOPY;  Service: Cardiovascular;  Laterality: N/A;   TEMPORARY DIALYSIS CATHETER  10/16/2020   Procedure: TEMPORARY DIALYSIS CATHETER;  Surgeon: Jolaine Artist, MD;  Location: [place] CV LAB;  Service: Cardiovascular;;   Social History   Socioeconomic History   Marital status: Married    Spouse name: Not on file   Number of children: Not on file   Years of education:  Not on file   Highest education level: Not on file  Occupational History   Not on file  Tobacco Use   Smoking status: Former    Packs/day: 0.50    Types: Cigarettes    Quit date: 07/26/1987    Years since quitting: 35.1   Smokeless tobacco: Never  Vaping Use   Vaping Use: Never used  Substance and Sexual Activity   Alcohol use: No    Alcohol/week: 0.0 standard drinks of alcohol    Comment: Previously drank socially, last drank 25-30 years ago   Drug use: No   Sexual activity: Not on file  Other Topics Concern   Not on file  Social History Narrative   Not on file   Social Determinants of Health   Financial Resource Strain: Low Risk  (07/05/2022)   Overall Financial Resource Strain (CARDIA)    Difficulty of Paying Living Expenses: Not very hard  Food Insecurity: No Food Insecurity (07/05/2022)   Hunger Vital Sign    Worried About Running Out of Food in the Last Year: Never true    [place] in the Last Year: Never true  Transportation Needs: No Transportation Needs (07/06/2022)   PRAPARE - Hydrologist (Medical): No    Lack of Transportation (Non-Medical): No  Physical Activity: Not on file  Stress: Not on file  Social Connections: Not on file   Outpatient Encounter Medications as of 09/20/2022  Medication Sig   amiodarone (PACERONE) 100 MG tablet Take 1 tablet (  100 mg total) by mouth daily.   apixaban (ELIQUIS) 5 MG TABS tablet Take 1 tablet (5 mg total) by mouth 2 (two) times daily.   atorvastatin (LIPITOR) 20 MG tablet Take 1 tablet (20 mg total) by mouth daily.   blood glucose meter kit and supplies KIT Dispense based on patient and insurance preference. Use up to 2 times daily as directed. (FOR ICD-10 E11.65)   Blood Glucose Monitoring Suppl ([place]) w/Device KIT Use to test blood glucose twice daily. Dx Code: E11.65   glipiZIDE (GLUCOTROL) 5 MG tablet Take 1 tablet by mouth once daily with breakfast   glucose  blood (ONETOUCH VERIO) test strip Use as instructed to check blood glucose twice daily. Dx Code: E11.65   hydrocortisone cream 1 % Apply 1 application topically 3 (three) times daily as needed for itching (minor skin irritation).   [name] CARBONATE PO 500 mg. Patient is taking 500 mg tablet (chewable)3 times daily.   midodrine (PROAMATINE) 10 MG tablet Take 1 tablet (10 mg total) by mouth 3 (three) times a week.   OneTouch Delica Lancets [id number] MISC Use to check blood glucose twice daily. Dx Code: E11.65   oxyCODONE-acetaminophen (PERCOCET) 5-325 MG tablet Take 1 tablet by mouth every 6 (six) hours as needed for severe pain. (Patient not taking: Reported on 08/14/2021)   Polyethyl Glycol-Propyl Glycol (SYSTANE) 0.4-0.3 % SOLN Place 1 drop into both eyes daily as needed (Eye irritation).   torsemide (DEMADEX) 100 MG tablet Take 1 tablet (100 mg total) by mouth 3 (three) times a week. MONDAY WEDNESDAY FRIDAY   [DISCONTINUED] carvedilol (COREG) 3.125 MG tablet Take 1 tablet (3.125 mg total) by mouth 2 (two) times daily. DO NOT take on the mornings of dialysis   No facility-administered encounter medications on file as of 09/20/2022.   ALLERGIES: No Known Allergies VACCINATION STATUS: Immunization History  Administered Date(s) Administered   Moderna Sars-Covid-2 Vaccination 09/23/2019, 10/27/2019    Diabetes He presents for his follow-up diabetic visit. He has type 2 diabetes mellitus. The initial diagnosis of diabetes was made 2 years ago. His disease course has been stable. There are no hypoglycemic associated symptoms. Pertinent negatives for hypoglycemia include no confusion, headaches, pallor or seizures. There are no diabetic associated symptoms. Pertinent negatives for diabetes include no chest pain, no fatigue, no polydipsia, no polyphagia, no polyuria and no weakness. There are no hypoglycemic complications. Symptoms are improving. Diabetic complications include nephropathy. Risk factors for  coronary artery disease include diabetes mellitus, dyslipidemia, hypertension, male sex, obesity, sedentary lifestyle and tobacco exposure. He is compliant with treatment most of the time. His weight is decreasing steadily (He lost approximately 60 pounds since last visit.). He is following a generally unhealthy diet. He has had a previous visit with a dietitian. He never participates in exercise. There is no change in his home blood glucose trend. (He brought a dysfunctional meter.  His point-of-care A1c is 6.8%.  He denies hypoglycemia.    ) An ACE inhibitor/angiotensin II receptor blocker is being taken.  Hypertension This is a chronic problem. The current episode started more than 1 year ago. The problem is uncontrolled. Pertinent negatives include no chest pain, headaches, neck pain, palpitations or shortness of breath. Risk factors for coronary artery disease include diabetes mellitus, dyslipidemia, obesity, sedentary lifestyle and smoking/tobacco exposure. Past treatments include angiotensin blockers. Identifiable causes of hypertension include chronic renal disease.  Hyperlipidemia This is a chronic problem. The current episode started more than 1 year ago. Exacerbating  diseases include chronic renal disease, diabetes and obesity. Pertinent negatives include no chest pain, myalgias or shortness of breath. Current antihyperlipidemic treatment includes statins. Risk factors for coronary artery disease include diabetes mellitus, dyslipidemia, hypertension, family history, male sex, obesity and a sedentary lifestyle.    Review of Systems  Constitutional:  Negative for fatigue and unexpected weight change.  HENT:  Negative for dental problem, mouth sores and trouble swallowing.   Eyes:  Negative for visual disturbance.  Respiratory:  Negative for cough, choking, chest tightness, shortness of breath and wheezing.   Cardiovascular:  Negative for chest pain, palpitations and leg swelling.   Gastrointestinal:  Negative for abdominal distention, abdominal pain, constipation, diarrhea, nausea and vomiting.  Endocrine: Negative for polydipsia, polyphagia and polyuria.  Genitourinary:  Negative for dysuria, flank pain, hematuria and urgency.  Musculoskeletal:  Negative for back pain, gait problem, myalgias and neck pain.  Skin:  Negative for pallor, rash and wound.  Neurological:  Negative for seizures, syncope, weakness, numbness and headaches.  Psychiatric/Behavioral: Negative.  Negative for confusion and dysphoric mood.     Objective:    BP (!) 74/38 Comment: pt reports his BP can run low at times, denies any dizziness or any other symptoms. Dr.Jacqualyn Sedgwick made aware.  Pulse 72   Ht 5' 6.5" (1.689 m)   Wt 200 lb 9.6 oz (91 kg)   BMI 31.89 kg/m   Wt Readings from Last 3 Encounters:  09/20/22 200 lb 9.6 oz (91 kg)  06/23/22 200 lb (90.7 kg)  02/12/22 193 lb (87.5 kg)      Results for orders placed or performed in visit on 09/20/22  HgB A1c  Result Value Ref Range   Hemoglobin A1C     HbA1c POC (<> result, manual entry)     HbA1c, POC (prediabetic range)     HbA1c, POC (controlled diabetic range) 6.8 0.0 - 7.0 %   Diabetic Labs (most recent): Lab Results  Component Value Date   HGBA1C 6.8 09/20/2022   HGBA1C 6.1 02/12/2022   HGBA1C 5.6 08/14/2021   MICROALBUR 142.0 12/04/2019   MICROALBUR 1.2 06/20/2017   Lipid Panel     Component Value Date/Time   CHOL 142 09/13/2022 0817   TRIG 64 09/13/2022 0817   HDL 64 09/13/2022 0817   CHOLHDL 2.2 09/13/2022 0817   CHOLHDL 3.1 04/08/2020 0724   VLDL 14 01/19/2016 0719   LDLCALC 65 09/13/2022 0817   LDLCALC 85 04/08/2020 0724     Assessment & Plan:   1. 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     - He remains at a high risk for more acute and chronic complications of diabetes which include CAD, CVA, retinopathy, and neuropathy. These are all discussed in detail with the patient.  He  brought a dysfunctional meter.  His point-of-care A1c is 6.8%.  He denies hypoglycemia.    - I have re-counseled the patient on diet management and weight loss  by adopting a carbohydrate restricted / protein rich  Diet.  - he acknowledges that there is a room for improvement in his food and drink choices. - Suggestion is made for him to avoid simple carbohydrates  from his diet including Cakes, Sweet Desserts, Ice Cream, Soda (diet and regular), Sweet Tea, Candies, Chips, Cookies, Store Bought Juices, Alcohol in Excess of  1-2 drinks a day, Artificial Sweeteners,  Coffee Creamer, and "Sugar-free" Products, Lemonade. This will help patient to have more stable blood glucose profile and potentially avoid  unintended weight gain.  - Patient is advised to stick to a routine mealtimes to eat 3 meals  a day and avoid unnecessary snacks ( to snack only to correct hypoglycemia).  - I have approached patient with the following individualized plan to manage diabetes and patient agrees.   -Emphasizing the need for monitoring blood glucose at least once a day before breakfast.  He is advised to continue glipizide 5 mg XL p.o. daily at breakfast.    -This is only diabetes medication he will be kept on for now. -He is encouraged to call clinic for blood glucose readings below 70 or greater than 200 mg/dl at fasting. - Patient specific target  for A1c; LDL, HDL, Triglycerides,  were discussed in detail.  2) BP/HTN: His blood pressure is marginally below normal.  He is advised to discontinue Coreg.  He is taking torsemide 100 mg p.o. daily.  He is also on midodrine for orthostatic hypotension.   3) Lipids/HPL: His recent lipid panel showed controlled LDL at 65.  He is advised to continue with atorvastatin 20 mg p.o. nightly.  Side effects and precautions discussed with him.    4)  Weight/Diet: His BMI is 31.89--recently dropping from 43.3 during his last visit.  He has lost weight related to initiation of  hemodialysis in the interval.     5) Chronic Care/Health Maintenance:  -Patient is on ACEI/ARB and Statin medications and encouraged to continue to follow up with Ophthalmology, nephrology, cardiology, podiatrist at least yearly or according to recommendations, and advised to stay away from smoking. I have recommended yearly flu vaccine and pneumonia vaccination at least every 5 years; moderate intensity exercise for up to 150 minutes weekly; and  sleep for at least 7 hours a day.  He is currently on amiodarone related to his recent diagnosis of tachyarrhythmia-atrial flutter/atrial fibrillation.  He will need thyroid function test at least twice a year.  His last thyroid function test from August 07, 2021 showed TSH 1.16, free T4 1.75, this is consistent with euthyroid presentation.     - I advised patient to maintain close follow up with Sharilyn Sites, MD for primary care needs.   I spent  26  minutes in the care of the patient today including review of labs from [place], Lipids, Thyroid Function, Hematology (current and previous including abstractions from other facilities); face-to-face time discussing  his blood glucose readings/logs, discussing hypoglycemia and hyperglycemia episodes and symptoms, medications doses, his options of short and long term treatment based on the latest standards of care / guidelines;  discussion about incorporating lifestyle medicine;  and documenting the encounter. Risk reduction counseling performed per USPSTF guidelines to reduce  obesity and cardiovascular risk factors.     Please refer to Patient Instructions for Blood Glucose Monitoring and Insulin/Medications Dosing Guide"  in media tab for additional information. Please  also refer to " Patient Self Inventory" in the Media  tab for reviewed elements of pertinent patient history.  Kyle Hicks participated in the discussions, expressed understanding, and voiced agreement with the above plans.  All questions  were answered to his satisfaction. he is encouraged to contact clinic should he have any questions or concerns prior to his return visit.    Follow up plan: -Return in about 4 months (around 01/19/2023) for Bring Meter/CGM Device/Logs- A1c in Office.  Glade Lloyd, MD Phone: [phone]  Fax: [phone]  -  This note was partially dictated with voice recognition software. Similar sounding words can be  transcribed inadequately or may not  be corrected upon review.  09/20/2022, 3:30 PM

## 2022-09-21 DIAGNOSIS — Z992 Dependence on renal dialysis: Secondary | ICD-10-CM | POA: Diagnosis not present

## 2022-09-21 DIAGNOSIS — N186 End stage renal disease: Secondary | ICD-10-CM | POA: Diagnosis not present

## 2022-09-21 DIAGNOSIS — D689 Coagulation defect, unspecified: Secondary | ICD-10-CM | POA: Diagnosis not present

## 2022-09-21 DIAGNOSIS — N2581 Secondary hyperparathyroidism of renal origin: Secondary | ICD-10-CM | POA: Diagnosis not present

## 2022-09-21 DIAGNOSIS — E119 Type 2 diabetes mellitus without complications: Secondary | ICD-10-CM | POA: Diagnosis not present

## 2022-09-23 DIAGNOSIS — Z992 Dependence on renal dialysis: Secondary | ICD-10-CM | POA: Diagnosis not present

## 2022-09-23 DIAGNOSIS — I150 Renovascular hypertension: Secondary | ICD-10-CM | POA: Diagnosis not present

## 2022-09-23 DIAGNOSIS — D689 Coagulation defect, unspecified: Secondary | ICD-10-CM | POA: Diagnosis not present

## 2022-09-23 DIAGNOSIS — N186 End stage renal disease: Secondary | ICD-10-CM | POA: Diagnosis not present

## 2022-09-23 DIAGNOSIS — E119 Type 2 diabetes mellitus without complications: Secondary | ICD-10-CM | POA: Diagnosis not present

## 2022-09-23 DIAGNOSIS — N2581 Secondary hyperparathyroidism of renal origin: Secondary | ICD-10-CM | POA: Diagnosis not present

## 2022-09-25 DIAGNOSIS — N186 End stage renal disease: Secondary | ICD-10-CM | POA: Diagnosis not present

## 2022-09-25 DIAGNOSIS — E119 Type 2 diabetes mellitus without complications: Secondary | ICD-10-CM | POA: Diagnosis not present

## 2022-09-25 DIAGNOSIS — Z992 Dependence on renal dialysis: Secondary | ICD-10-CM | POA: Diagnosis not present

## 2022-09-25 DIAGNOSIS — N2581 Secondary hyperparathyroidism of renal origin: Secondary | ICD-10-CM | POA: Diagnosis not present

## 2022-09-25 DIAGNOSIS — D631 Anemia in chronic kidney disease: Secondary | ICD-10-CM | POA: Diagnosis not present

## 2022-09-25 DIAGNOSIS — D689 Coagulation defect, unspecified: Secondary | ICD-10-CM | POA: Diagnosis not present

## 2022-09-28 DIAGNOSIS — D689 Coagulation defect, unspecified: Secondary | ICD-10-CM | POA: Diagnosis not present

## 2022-09-28 DIAGNOSIS — N2581 Secondary hyperparathyroidism of renal origin: Secondary | ICD-10-CM | POA: Diagnosis not present

## 2022-09-28 DIAGNOSIS — D631 Anemia in chronic kidney disease: Secondary | ICD-10-CM | POA: Diagnosis not present

## 2022-09-28 DIAGNOSIS — Z992 Dependence on renal dialysis: Secondary | ICD-10-CM | POA: Diagnosis not present

## 2022-09-28 DIAGNOSIS — E119 Type 2 diabetes mellitus without complications: Secondary | ICD-10-CM | POA: Diagnosis not present

## 2022-09-28 DIAGNOSIS — N186 End stage renal disease: Secondary | ICD-10-CM | POA: Diagnosis not present

## 2022-09-30 DIAGNOSIS — D689 Coagulation defect, unspecified: Secondary | ICD-10-CM | POA: Diagnosis not present

## 2022-09-30 DIAGNOSIS — D631 Anemia in chronic kidney disease: Secondary | ICD-10-CM | POA: Diagnosis not present

## 2022-09-30 DIAGNOSIS — E119 Type 2 diabetes mellitus without complications: Secondary | ICD-10-CM | POA: Diagnosis not present

## 2022-09-30 DIAGNOSIS — N186 End stage renal disease: Secondary | ICD-10-CM | POA: Diagnosis not present

## 2022-09-30 DIAGNOSIS — Z992 Dependence on renal dialysis: Secondary | ICD-10-CM | POA: Diagnosis not present

## 2022-09-30 DIAGNOSIS — N2581 Secondary hyperparathyroidism of renal origin: Secondary | ICD-10-CM | POA: Diagnosis not present

## 2022-10-02 DIAGNOSIS — D689 Coagulation defect, unspecified: Secondary | ICD-10-CM | POA: Diagnosis not present

## 2022-10-02 DIAGNOSIS — E119 Type 2 diabetes mellitus without complications: Secondary | ICD-10-CM | POA: Diagnosis not present

## 2022-10-02 DIAGNOSIS — D631 Anemia in chronic kidney disease: Secondary | ICD-10-CM | POA: Diagnosis not present

## 2022-10-02 DIAGNOSIS — N186 End stage renal disease: Secondary | ICD-10-CM | POA: Diagnosis not present

## 2022-10-02 DIAGNOSIS — Z992 Dependence on renal dialysis: Secondary | ICD-10-CM | POA: Diagnosis not present

## 2022-10-02 DIAGNOSIS — N2581 Secondary hyperparathyroidism of renal origin: Secondary | ICD-10-CM | POA: Diagnosis not present

## 2022-10-05 DIAGNOSIS — N2581 Secondary hyperparathyroidism of renal origin: Secondary | ICD-10-CM | POA: Diagnosis not present

## 2022-10-05 DIAGNOSIS — Z992 Dependence on renal dialysis: Secondary | ICD-10-CM | POA: Diagnosis not present

## 2022-10-05 DIAGNOSIS — N186 End stage renal disease: Secondary | ICD-10-CM | POA: Diagnosis not present

## 2022-10-05 DIAGNOSIS — D631 Anemia in chronic kidney disease: Secondary | ICD-10-CM | POA: Diagnosis not present

## 2022-10-05 DIAGNOSIS — E119 Type 2 diabetes mellitus without complications: Secondary | ICD-10-CM | POA: Diagnosis not present

## 2022-10-05 DIAGNOSIS — D689 Coagulation defect, unspecified: Secondary | ICD-10-CM | POA: Diagnosis not present

## 2022-10-07 DIAGNOSIS — D689 Coagulation defect, unspecified: Secondary | ICD-10-CM | POA: Diagnosis not present

## 2022-10-07 DIAGNOSIS — Z992 Dependence on renal dialysis: Secondary | ICD-10-CM | POA: Diagnosis not present

## 2022-10-07 DIAGNOSIS — D631 Anemia in chronic kidney disease: Secondary | ICD-10-CM | POA: Diagnosis not present

## 2022-10-07 DIAGNOSIS — N2581 Secondary hyperparathyroidism of renal origin: Secondary | ICD-10-CM | POA: Diagnosis not present

## 2022-10-07 DIAGNOSIS — N186 End stage renal disease: Secondary | ICD-10-CM | POA: Diagnosis not present

## 2022-10-07 DIAGNOSIS — E119 Type 2 diabetes mellitus without complications: Secondary | ICD-10-CM | POA: Diagnosis not present

## 2022-10-09 DIAGNOSIS — D631 Anemia in chronic kidney disease: Secondary | ICD-10-CM | POA: Diagnosis not present

## 2022-10-09 DIAGNOSIS — E119 Type 2 diabetes mellitus without complications: Secondary | ICD-10-CM | POA: Diagnosis not present

## 2022-10-09 DIAGNOSIS — Z992 Dependence on renal dialysis: Secondary | ICD-10-CM | POA: Diagnosis not present

## 2022-10-09 DIAGNOSIS — D689 Coagulation defect, unspecified: Secondary | ICD-10-CM | POA: Diagnosis not present

## 2022-10-09 DIAGNOSIS — N2581 Secondary hyperparathyroidism of renal origin: Secondary | ICD-10-CM | POA: Diagnosis not present

## 2022-10-09 DIAGNOSIS — N186 End stage renal disease: Secondary | ICD-10-CM | POA: Diagnosis not present

## 2022-10-12 DIAGNOSIS — N2581 Secondary hyperparathyroidism of renal origin: Secondary | ICD-10-CM | POA: Diagnosis not present

## 2022-10-12 DIAGNOSIS — E119 Type 2 diabetes mellitus without complications: Secondary | ICD-10-CM | POA: Diagnosis not present

## 2022-10-12 DIAGNOSIS — N186 End stage renal disease: Secondary | ICD-10-CM | POA: Diagnosis not present

## 2022-10-12 DIAGNOSIS — Z992 Dependence on renal dialysis: Secondary | ICD-10-CM | POA: Diagnosis not present

## 2022-10-12 DIAGNOSIS — D631 Anemia in chronic kidney disease: Secondary | ICD-10-CM | POA: Diagnosis not present

## 2022-10-12 DIAGNOSIS — D689 Coagulation defect, unspecified: Secondary | ICD-10-CM | POA: Diagnosis not present

## 2022-10-14 DIAGNOSIS — D631 Anemia in chronic kidney disease: Secondary | ICD-10-CM | POA: Diagnosis not present

## 2022-10-14 DIAGNOSIS — E119 Type 2 diabetes mellitus without complications: Secondary | ICD-10-CM | POA: Diagnosis not present

## 2022-10-14 DIAGNOSIS — Z992 Dependence on renal dialysis: Secondary | ICD-10-CM | POA: Diagnosis not present

## 2022-10-14 DIAGNOSIS — N2581 Secondary hyperparathyroidism of renal origin: Secondary | ICD-10-CM | POA: Diagnosis not present

## 2022-10-14 DIAGNOSIS — D689 Coagulation defect, unspecified: Secondary | ICD-10-CM | POA: Diagnosis not present

## 2022-10-14 DIAGNOSIS — N186 End stage renal disease: Secondary | ICD-10-CM | POA: Diagnosis not present

## 2022-10-16 DIAGNOSIS — Z992 Dependence on renal dialysis: Secondary | ICD-10-CM | POA: Diagnosis not present

## 2022-10-16 DIAGNOSIS — N186 End stage renal disease: Secondary | ICD-10-CM | POA: Diagnosis not present

## 2022-10-16 DIAGNOSIS — D631 Anemia in chronic kidney disease: Secondary | ICD-10-CM | POA: Diagnosis not present

## 2022-10-16 DIAGNOSIS — E119 Type 2 diabetes mellitus without complications: Secondary | ICD-10-CM | POA: Diagnosis not present

## 2022-10-16 DIAGNOSIS — D689 Coagulation defect, unspecified: Secondary | ICD-10-CM | POA: Diagnosis not present

## 2022-10-16 DIAGNOSIS — N2581 Secondary hyperparathyroidism of renal origin: Secondary | ICD-10-CM | POA: Diagnosis not present

## 2022-10-19 DIAGNOSIS — D631 Anemia in chronic kidney disease: Secondary | ICD-10-CM | POA: Diagnosis not present

## 2022-10-19 DIAGNOSIS — E119 Type 2 diabetes mellitus without complications: Secondary | ICD-10-CM | POA: Diagnosis not present

## 2022-10-19 DIAGNOSIS — N186 End stage renal disease: Secondary | ICD-10-CM | POA: Diagnosis not present

## 2022-10-19 DIAGNOSIS — Z992 Dependence on renal dialysis: Secondary | ICD-10-CM | POA: Diagnosis not present

## 2022-10-19 DIAGNOSIS — D689 Coagulation defect, unspecified: Secondary | ICD-10-CM | POA: Diagnosis not present

## 2022-10-19 DIAGNOSIS — N2581 Secondary hyperparathyroidism of renal origin: Secondary | ICD-10-CM | POA: Diagnosis not present

## 2022-10-21 DIAGNOSIS — Z992 Dependence on renal dialysis: Secondary | ICD-10-CM | POA: Diagnosis not present

## 2022-10-21 DIAGNOSIS — N2581 Secondary hyperparathyroidism of renal origin: Secondary | ICD-10-CM | POA: Diagnosis not present

## 2022-10-21 DIAGNOSIS — N186 End stage renal disease: Secondary | ICD-10-CM | POA: Diagnosis not present

## 2022-10-21 DIAGNOSIS — D631 Anemia in chronic kidney disease: Secondary | ICD-10-CM | POA: Diagnosis not present

## 2022-10-21 DIAGNOSIS — D689 Coagulation defect, unspecified: Secondary | ICD-10-CM | POA: Diagnosis not present

## 2022-10-21 DIAGNOSIS — E119 Type 2 diabetes mellitus without complications: Secondary | ICD-10-CM | POA: Diagnosis not present

## 2022-10-23 DIAGNOSIS — N2581 Secondary hyperparathyroidism of renal origin: Secondary | ICD-10-CM | POA: Diagnosis not present

## 2022-10-23 DIAGNOSIS — E119 Type 2 diabetes mellitus without complications: Secondary | ICD-10-CM | POA: Diagnosis not present

## 2022-10-23 DIAGNOSIS — D631 Anemia in chronic kidney disease: Secondary | ICD-10-CM | POA: Diagnosis not present

## 2022-10-23 DIAGNOSIS — N186 End stage renal disease: Secondary | ICD-10-CM | POA: Diagnosis not present

## 2022-10-23 DIAGNOSIS — Z992 Dependence on renal dialysis: Secondary | ICD-10-CM | POA: Diagnosis not present

## 2022-10-23 DIAGNOSIS — D689 Coagulation defect, unspecified: Secondary | ICD-10-CM | POA: Diagnosis not present

## 2022-10-24 DIAGNOSIS — N186 End stage renal disease: Secondary | ICD-10-CM | POA: Diagnosis not present

## 2022-10-24 DIAGNOSIS — I150 Renovascular hypertension: Secondary | ICD-10-CM | POA: Diagnosis not present

## 2022-10-24 DIAGNOSIS — Z992 Dependence on renal dialysis: Secondary | ICD-10-CM | POA: Diagnosis not present

## 2022-10-26 DIAGNOSIS — E119 Type 2 diabetes mellitus without complications: Secondary | ICD-10-CM | POA: Diagnosis not present

## 2022-10-26 DIAGNOSIS — D689 Coagulation defect, unspecified: Secondary | ICD-10-CM | POA: Diagnosis not present

## 2022-10-26 DIAGNOSIS — N186 End stage renal disease: Secondary | ICD-10-CM | POA: Diagnosis not present

## 2022-10-26 DIAGNOSIS — N2581 Secondary hyperparathyroidism of renal origin: Secondary | ICD-10-CM | POA: Diagnosis not present

## 2022-10-26 DIAGNOSIS — Z992 Dependence on renal dialysis: Secondary | ICD-10-CM | POA: Diagnosis not present

## 2022-10-28 DIAGNOSIS — N186 End stage renal disease: Secondary | ICD-10-CM | POA: Diagnosis not present

## 2022-10-28 DIAGNOSIS — Z992 Dependence on renal dialysis: Secondary | ICD-10-CM | POA: Diagnosis not present

## 2022-10-28 DIAGNOSIS — N2581 Secondary hyperparathyroidism of renal origin: Secondary | ICD-10-CM | POA: Diagnosis not present

## 2022-10-28 DIAGNOSIS — E119 Type 2 diabetes mellitus without complications: Secondary | ICD-10-CM | POA: Diagnosis not present

## 2022-10-28 DIAGNOSIS — D689 Coagulation defect, unspecified: Secondary | ICD-10-CM | POA: Diagnosis not present

## 2022-10-30 DIAGNOSIS — D689 Coagulation defect, unspecified: Secondary | ICD-10-CM | POA: Diagnosis not present

## 2022-10-30 DIAGNOSIS — Z992 Dependence on renal dialysis: Secondary | ICD-10-CM | POA: Diagnosis not present

## 2022-10-30 DIAGNOSIS — N2581 Secondary hyperparathyroidism of renal origin: Secondary | ICD-10-CM | POA: Diagnosis not present

## 2022-10-30 DIAGNOSIS — E119 Type 2 diabetes mellitus without complications: Secondary | ICD-10-CM | POA: Diagnosis not present

## 2022-10-30 DIAGNOSIS — N186 End stage renal disease: Secondary | ICD-10-CM | POA: Diagnosis not present

## 2022-11-02 DIAGNOSIS — Z992 Dependence on renal dialysis: Secondary | ICD-10-CM | POA: Diagnosis not present

## 2022-11-02 DIAGNOSIS — N2581 Secondary hyperparathyroidism of renal origin: Secondary | ICD-10-CM | POA: Diagnosis not present

## 2022-11-02 DIAGNOSIS — N186 End stage renal disease: Secondary | ICD-10-CM | POA: Diagnosis not present

## 2022-11-02 DIAGNOSIS — E119 Type 2 diabetes mellitus without complications: Secondary | ICD-10-CM | POA: Diagnosis not present

## 2022-11-02 DIAGNOSIS — D689 Coagulation defect, unspecified: Secondary | ICD-10-CM | POA: Diagnosis not present

## 2022-11-04 DIAGNOSIS — N186 End stage renal disease: Secondary | ICD-10-CM | POA: Diagnosis not present

## 2022-11-04 DIAGNOSIS — Z992 Dependence on renal dialysis: Secondary | ICD-10-CM | POA: Diagnosis not present

## 2022-11-04 DIAGNOSIS — E119 Type 2 diabetes mellitus without complications: Secondary | ICD-10-CM | POA: Diagnosis not present

## 2022-11-04 DIAGNOSIS — N2581 Secondary hyperparathyroidism of renal origin: Secondary | ICD-10-CM | POA: Diagnosis not present

## 2022-11-04 DIAGNOSIS — D689 Coagulation defect, unspecified: Secondary | ICD-10-CM | POA: Diagnosis not present

## 2022-11-06 DIAGNOSIS — D689 Coagulation defect, unspecified: Secondary | ICD-10-CM | POA: Diagnosis not present

## 2022-11-06 DIAGNOSIS — E119 Type 2 diabetes mellitus without complications: Secondary | ICD-10-CM | POA: Diagnosis not present

## 2022-11-06 DIAGNOSIS — N2581 Secondary hyperparathyroidism of renal origin: Secondary | ICD-10-CM | POA: Diagnosis not present

## 2022-11-06 DIAGNOSIS — N186 End stage renal disease: Secondary | ICD-10-CM | POA: Diagnosis not present

## 2022-11-06 DIAGNOSIS — Z992 Dependence on renal dialysis: Secondary | ICD-10-CM | POA: Diagnosis not present

## 2022-11-09 DIAGNOSIS — N186 End stage renal disease: Secondary | ICD-10-CM | POA: Diagnosis not present

## 2022-11-09 DIAGNOSIS — D689 Coagulation defect, unspecified: Secondary | ICD-10-CM | POA: Diagnosis not present

## 2022-11-09 DIAGNOSIS — N2581 Secondary hyperparathyroidism of renal origin: Secondary | ICD-10-CM | POA: Diagnosis not present

## 2022-11-09 DIAGNOSIS — Z992 Dependence on renal dialysis: Secondary | ICD-10-CM | POA: Diagnosis not present

## 2022-11-09 DIAGNOSIS — E119 Type 2 diabetes mellitus without complications: Secondary | ICD-10-CM | POA: Diagnosis not present

## 2022-11-11 DIAGNOSIS — E119 Type 2 diabetes mellitus without complications: Secondary | ICD-10-CM | POA: Diagnosis not present

## 2022-11-11 DIAGNOSIS — D689 Coagulation defect, unspecified: Secondary | ICD-10-CM | POA: Diagnosis not present

## 2022-11-11 DIAGNOSIS — Z992 Dependence on renal dialysis: Secondary | ICD-10-CM | POA: Diagnosis not present

## 2022-11-11 DIAGNOSIS — N2581 Secondary hyperparathyroidism of renal origin: Secondary | ICD-10-CM | POA: Diagnosis not present

## 2022-11-11 DIAGNOSIS — N186 End stage renal disease: Secondary | ICD-10-CM | POA: Diagnosis not present

## 2022-11-13 DIAGNOSIS — D689 Coagulation defect, unspecified: Secondary | ICD-10-CM | POA: Diagnosis not present

## 2022-11-13 DIAGNOSIS — N2581 Secondary hyperparathyroidism of renal origin: Secondary | ICD-10-CM | POA: Diagnosis not present

## 2022-11-13 DIAGNOSIS — E119 Type 2 diabetes mellitus without complications: Secondary | ICD-10-CM | POA: Diagnosis not present

## 2022-11-13 DIAGNOSIS — Z992 Dependence on renal dialysis: Secondary | ICD-10-CM | POA: Diagnosis not present

## 2022-11-13 DIAGNOSIS — N186 End stage renal disease: Secondary | ICD-10-CM | POA: Diagnosis not present

## 2022-11-16 DIAGNOSIS — D689 Coagulation defect, unspecified: Secondary | ICD-10-CM | POA: Diagnosis not present

## 2022-11-16 DIAGNOSIS — Z992 Dependence on renal dialysis: Secondary | ICD-10-CM | POA: Diagnosis not present

## 2022-11-16 DIAGNOSIS — N186 End stage renal disease: Secondary | ICD-10-CM | POA: Diagnosis not present

## 2022-11-16 DIAGNOSIS — N2581 Secondary hyperparathyroidism of renal origin: Secondary | ICD-10-CM | POA: Diagnosis not present

## 2022-11-16 DIAGNOSIS — E119 Type 2 diabetes mellitus without complications: Secondary | ICD-10-CM | POA: Diagnosis not present

## 2022-11-17 DIAGNOSIS — N186 End stage renal disease: Secondary | ICD-10-CM | POA: Diagnosis not present

## 2022-11-17 DIAGNOSIS — I871 Compression of vein: Secondary | ICD-10-CM | POA: Diagnosis not present

## 2022-11-17 DIAGNOSIS — Z992 Dependence on renal dialysis: Secondary | ICD-10-CM | POA: Diagnosis not present

## 2022-11-17 DIAGNOSIS — T82858A Stenosis of vascular prosthetic devices, implants and grafts, initial encounter: Secondary | ICD-10-CM | POA: Diagnosis not present

## 2022-11-18 DIAGNOSIS — D689 Coagulation defect, unspecified: Secondary | ICD-10-CM | POA: Diagnosis not present

## 2022-11-18 DIAGNOSIS — Z992 Dependence on renal dialysis: Secondary | ICD-10-CM | POA: Diagnosis not present

## 2022-11-18 DIAGNOSIS — N2581 Secondary hyperparathyroidism of renal origin: Secondary | ICD-10-CM | POA: Diagnosis not present

## 2022-11-18 DIAGNOSIS — N186 End stage renal disease: Secondary | ICD-10-CM | POA: Diagnosis not present

## 2022-11-18 DIAGNOSIS — E119 Type 2 diabetes mellitus without complications: Secondary | ICD-10-CM | POA: Diagnosis not present

## 2022-11-20 DIAGNOSIS — E119 Type 2 diabetes mellitus without complications: Secondary | ICD-10-CM | POA: Diagnosis not present

## 2022-11-20 DIAGNOSIS — D689 Coagulation defect, unspecified: Secondary | ICD-10-CM | POA: Diagnosis not present

## 2022-11-20 DIAGNOSIS — N186 End stage renal disease: Secondary | ICD-10-CM | POA: Diagnosis not present

## 2022-11-20 DIAGNOSIS — N2581 Secondary hyperparathyroidism of renal origin: Secondary | ICD-10-CM | POA: Diagnosis not present

## 2022-11-20 DIAGNOSIS — Z992 Dependence on renal dialysis: Secondary | ICD-10-CM | POA: Diagnosis not present

## 2022-11-23 DIAGNOSIS — I150 Renovascular hypertension: Secondary | ICD-10-CM | POA: Diagnosis not present

## 2022-11-23 DIAGNOSIS — Z992 Dependence on renal dialysis: Secondary | ICD-10-CM | POA: Diagnosis not present

## 2022-11-23 DIAGNOSIS — D689 Coagulation defect, unspecified: Secondary | ICD-10-CM | POA: Diagnosis not present

## 2022-11-23 DIAGNOSIS — N2581 Secondary hyperparathyroidism of renal origin: Secondary | ICD-10-CM | POA: Diagnosis not present

## 2022-11-23 DIAGNOSIS — E119 Type 2 diabetes mellitus without complications: Secondary | ICD-10-CM | POA: Diagnosis not present

## 2022-11-23 DIAGNOSIS — N186 End stage renal disease: Secondary | ICD-10-CM | POA: Diagnosis not present

## 2022-11-25 DIAGNOSIS — N186 End stage renal disease: Secondary | ICD-10-CM | POA: Diagnosis not present

## 2022-11-25 DIAGNOSIS — E119 Type 2 diabetes mellitus without complications: Secondary | ICD-10-CM | POA: Diagnosis not present

## 2022-11-25 DIAGNOSIS — Z992 Dependence on renal dialysis: Secondary | ICD-10-CM | POA: Diagnosis not present

## 2022-11-25 DIAGNOSIS — D689 Coagulation defect, unspecified: Secondary | ICD-10-CM | POA: Diagnosis not present

## 2022-11-25 DIAGNOSIS — N2581 Secondary hyperparathyroidism of renal origin: Secondary | ICD-10-CM | POA: Diagnosis not present

## 2022-11-25 DIAGNOSIS — E039 Hypothyroidism, unspecified: Secondary | ICD-10-CM | POA: Diagnosis not present

## 2022-11-25 DIAGNOSIS — D631 Anemia in chronic kidney disease: Secondary | ICD-10-CM | POA: Diagnosis not present

## 2022-11-27 DIAGNOSIS — N2581 Secondary hyperparathyroidism of renal origin: Secondary | ICD-10-CM | POA: Diagnosis not present

## 2022-11-27 DIAGNOSIS — Z992 Dependence on renal dialysis: Secondary | ICD-10-CM | POA: Diagnosis not present

## 2022-11-27 DIAGNOSIS — N186 End stage renal disease: Secondary | ICD-10-CM | POA: Diagnosis not present

## 2022-11-27 DIAGNOSIS — D689 Coagulation defect, unspecified: Secondary | ICD-10-CM | POA: Diagnosis not present

## 2022-11-27 DIAGNOSIS — E039 Hypothyroidism, unspecified: Secondary | ICD-10-CM | POA: Diagnosis not present

## 2022-11-27 DIAGNOSIS — D631 Anemia in chronic kidney disease: Secondary | ICD-10-CM | POA: Diagnosis not present

## 2022-11-27 DIAGNOSIS — E119 Type 2 diabetes mellitus without complications: Secondary | ICD-10-CM | POA: Diagnosis not present

## 2022-11-30 DIAGNOSIS — D631 Anemia in chronic kidney disease: Secondary | ICD-10-CM | POA: Diagnosis not present

## 2022-11-30 DIAGNOSIS — Z992 Dependence on renal dialysis: Secondary | ICD-10-CM | POA: Diagnosis not present

## 2022-11-30 DIAGNOSIS — N2581 Secondary hyperparathyroidism of renal origin: Secondary | ICD-10-CM | POA: Diagnosis not present

## 2022-11-30 DIAGNOSIS — N186 End stage renal disease: Secondary | ICD-10-CM | POA: Diagnosis not present

## 2022-11-30 DIAGNOSIS — E119 Type 2 diabetes mellitus without complications: Secondary | ICD-10-CM | POA: Diagnosis not present

## 2022-11-30 DIAGNOSIS — E039 Hypothyroidism, unspecified: Secondary | ICD-10-CM | POA: Diagnosis not present

## 2022-11-30 DIAGNOSIS — D689 Coagulation defect, unspecified: Secondary | ICD-10-CM | POA: Diagnosis not present

## 2022-12-02 DIAGNOSIS — N2581 Secondary hyperparathyroidism of renal origin: Secondary | ICD-10-CM | POA: Diagnosis not present

## 2022-12-02 DIAGNOSIS — D689 Coagulation defect, unspecified: Secondary | ICD-10-CM | POA: Diagnosis not present

## 2022-12-02 DIAGNOSIS — E039 Hypothyroidism, unspecified: Secondary | ICD-10-CM | POA: Diagnosis not present

## 2022-12-02 DIAGNOSIS — E119 Type 2 diabetes mellitus without complications: Secondary | ICD-10-CM | POA: Diagnosis not present

## 2022-12-02 DIAGNOSIS — N186 End stage renal disease: Secondary | ICD-10-CM | POA: Diagnosis not present

## 2022-12-02 DIAGNOSIS — D631 Anemia in chronic kidney disease: Secondary | ICD-10-CM | POA: Diagnosis not present

## 2022-12-02 DIAGNOSIS — Z992 Dependence on renal dialysis: Secondary | ICD-10-CM | POA: Diagnosis not present

## 2022-12-04 DIAGNOSIS — N2581 Secondary hyperparathyroidism of renal origin: Secondary | ICD-10-CM | POA: Diagnosis not present

## 2022-12-04 DIAGNOSIS — D689 Coagulation defect, unspecified: Secondary | ICD-10-CM | POA: Diagnosis not present

## 2022-12-04 DIAGNOSIS — N186 End stage renal disease: Secondary | ICD-10-CM | POA: Diagnosis not present

## 2022-12-04 DIAGNOSIS — E119 Type 2 diabetes mellitus without complications: Secondary | ICD-10-CM | POA: Diagnosis not present

## 2022-12-04 DIAGNOSIS — E039 Hypothyroidism, unspecified: Secondary | ICD-10-CM | POA: Diagnosis not present

## 2022-12-04 DIAGNOSIS — Z992 Dependence on renal dialysis: Secondary | ICD-10-CM | POA: Diagnosis not present

## 2022-12-04 DIAGNOSIS — D631 Anemia in chronic kidney disease: Secondary | ICD-10-CM | POA: Diagnosis not present

## 2022-12-07 DIAGNOSIS — E039 Hypothyroidism, unspecified: Secondary | ICD-10-CM | POA: Diagnosis not present

## 2022-12-07 DIAGNOSIS — E119 Type 2 diabetes mellitus without complications: Secondary | ICD-10-CM | POA: Diagnosis not present

## 2022-12-07 DIAGNOSIS — N186 End stage renal disease: Secondary | ICD-10-CM | POA: Diagnosis not present

## 2022-12-07 DIAGNOSIS — Z992 Dependence on renal dialysis: Secondary | ICD-10-CM | POA: Diagnosis not present

## 2022-12-07 DIAGNOSIS — D689 Coagulation defect, unspecified: Secondary | ICD-10-CM | POA: Diagnosis not present

## 2022-12-07 DIAGNOSIS — N2581 Secondary hyperparathyroidism of renal origin: Secondary | ICD-10-CM | POA: Diagnosis not present

## 2022-12-07 DIAGNOSIS — D631 Anemia in chronic kidney disease: Secondary | ICD-10-CM | POA: Diagnosis not present

## 2022-12-09 DIAGNOSIS — E039 Hypothyroidism, unspecified: Secondary | ICD-10-CM | POA: Diagnosis not present

## 2022-12-09 DIAGNOSIS — E119 Type 2 diabetes mellitus without complications: Secondary | ICD-10-CM | POA: Diagnosis not present

## 2022-12-09 DIAGNOSIS — N2581 Secondary hyperparathyroidism of renal origin: Secondary | ICD-10-CM | POA: Diagnosis not present

## 2022-12-09 DIAGNOSIS — D631 Anemia in chronic kidney disease: Secondary | ICD-10-CM | POA: Diagnosis not present

## 2022-12-09 DIAGNOSIS — Z992 Dependence on renal dialysis: Secondary | ICD-10-CM | POA: Diagnosis not present

## 2022-12-09 DIAGNOSIS — N186 End stage renal disease: Secondary | ICD-10-CM | POA: Diagnosis not present

## 2022-12-09 DIAGNOSIS — D689 Coagulation defect, unspecified: Secondary | ICD-10-CM | POA: Diagnosis not present

## 2022-12-11 DIAGNOSIS — Z992 Dependence on renal dialysis: Secondary | ICD-10-CM | POA: Diagnosis not present

## 2022-12-11 DIAGNOSIS — D631 Anemia in chronic kidney disease: Secondary | ICD-10-CM | POA: Diagnosis not present

## 2022-12-11 DIAGNOSIS — N186 End stage renal disease: Secondary | ICD-10-CM | POA: Diagnosis not present

## 2022-12-11 DIAGNOSIS — E039 Hypothyroidism, unspecified: Secondary | ICD-10-CM | POA: Diagnosis not present

## 2022-12-11 DIAGNOSIS — E119 Type 2 diabetes mellitus without complications: Secondary | ICD-10-CM | POA: Diagnosis not present

## 2022-12-11 DIAGNOSIS — D689 Coagulation defect, unspecified: Secondary | ICD-10-CM | POA: Diagnosis not present

## 2022-12-11 DIAGNOSIS — N2581 Secondary hyperparathyroidism of renal origin: Secondary | ICD-10-CM | POA: Diagnosis not present

## 2022-12-14 DIAGNOSIS — E039 Hypothyroidism, unspecified: Secondary | ICD-10-CM | POA: Diagnosis not present

## 2022-12-14 DIAGNOSIS — D631 Anemia in chronic kidney disease: Secondary | ICD-10-CM | POA: Diagnosis not present

## 2022-12-14 DIAGNOSIS — E119 Type 2 diabetes mellitus without complications: Secondary | ICD-10-CM | POA: Diagnosis not present

## 2022-12-14 DIAGNOSIS — N2581 Secondary hyperparathyroidism of renal origin: Secondary | ICD-10-CM | POA: Diagnosis not present

## 2022-12-14 DIAGNOSIS — D689 Coagulation defect, unspecified: Secondary | ICD-10-CM | POA: Diagnosis not present

## 2022-12-14 DIAGNOSIS — N186 End stage renal disease: Secondary | ICD-10-CM | POA: Diagnosis not present

## 2022-12-14 DIAGNOSIS — Z992 Dependence on renal dialysis: Secondary | ICD-10-CM | POA: Diagnosis not present

## 2022-12-16 DIAGNOSIS — N186 End stage renal disease: Secondary | ICD-10-CM | POA: Diagnosis not present

## 2022-12-16 DIAGNOSIS — Z992 Dependence on renal dialysis: Secondary | ICD-10-CM | POA: Diagnosis not present

## 2022-12-16 DIAGNOSIS — E119 Type 2 diabetes mellitus without complications: Secondary | ICD-10-CM | POA: Diagnosis not present

## 2022-12-16 DIAGNOSIS — E039 Hypothyroidism, unspecified: Secondary | ICD-10-CM | POA: Diagnosis not present

## 2022-12-16 DIAGNOSIS — N2581 Secondary hyperparathyroidism of renal origin: Secondary | ICD-10-CM | POA: Diagnosis not present

## 2022-12-16 DIAGNOSIS — D631 Anemia in chronic kidney disease: Secondary | ICD-10-CM | POA: Diagnosis not present

## 2022-12-16 DIAGNOSIS — D689 Coagulation defect, unspecified: Secondary | ICD-10-CM | POA: Diagnosis not present

## 2022-12-18 DIAGNOSIS — D631 Anemia in chronic kidney disease: Secondary | ICD-10-CM | POA: Diagnosis not present

## 2022-12-18 DIAGNOSIS — E119 Type 2 diabetes mellitus without complications: Secondary | ICD-10-CM | POA: Diagnosis not present

## 2022-12-18 DIAGNOSIS — Z992 Dependence on renal dialysis: Secondary | ICD-10-CM | POA: Diagnosis not present

## 2022-12-18 DIAGNOSIS — N2581 Secondary hyperparathyroidism of renal origin: Secondary | ICD-10-CM | POA: Diagnosis not present

## 2022-12-18 DIAGNOSIS — E039 Hypothyroidism, unspecified: Secondary | ICD-10-CM | POA: Diagnosis not present

## 2022-12-18 DIAGNOSIS — D689 Coagulation defect, unspecified: Secondary | ICD-10-CM | POA: Diagnosis not present

## 2022-12-18 DIAGNOSIS — N186 End stage renal disease: Secondary | ICD-10-CM | POA: Diagnosis not present

## 2022-12-21 DIAGNOSIS — E119 Type 2 diabetes mellitus without complications: Secondary | ICD-10-CM | POA: Diagnosis not present

## 2022-12-21 DIAGNOSIS — N186 End stage renal disease: Secondary | ICD-10-CM | POA: Diagnosis not present

## 2022-12-21 DIAGNOSIS — D689 Coagulation defect, unspecified: Secondary | ICD-10-CM | POA: Diagnosis not present

## 2022-12-21 DIAGNOSIS — Z992 Dependence on renal dialysis: Secondary | ICD-10-CM | POA: Diagnosis not present

## 2022-12-21 DIAGNOSIS — E039 Hypothyroidism, unspecified: Secondary | ICD-10-CM | POA: Diagnosis not present

## 2022-12-21 DIAGNOSIS — N2581 Secondary hyperparathyroidism of renal origin: Secondary | ICD-10-CM | POA: Diagnosis not present

## 2022-12-21 DIAGNOSIS — D631 Anemia in chronic kidney disease: Secondary | ICD-10-CM | POA: Diagnosis not present

## 2022-12-23 DIAGNOSIS — D631 Anemia in chronic kidney disease: Secondary | ICD-10-CM | POA: Diagnosis not present

## 2022-12-23 DIAGNOSIS — Z992 Dependence on renal dialysis: Secondary | ICD-10-CM | POA: Diagnosis not present

## 2022-12-23 DIAGNOSIS — E119 Type 2 diabetes mellitus without complications: Secondary | ICD-10-CM | POA: Diagnosis not present

## 2022-12-23 DIAGNOSIS — N186 End stage renal disease: Secondary | ICD-10-CM | POA: Diagnosis not present

## 2022-12-23 DIAGNOSIS — N2581 Secondary hyperparathyroidism of renal origin: Secondary | ICD-10-CM | POA: Diagnosis not present

## 2022-12-23 DIAGNOSIS — E039 Hypothyroidism, unspecified: Secondary | ICD-10-CM | POA: Diagnosis not present

## 2022-12-23 DIAGNOSIS — D689 Coagulation defect, unspecified: Secondary | ICD-10-CM | POA: Diagnosis not present

## 2022-12-24 DIAGNOSIS — I150 Renovascular hypertension: Secondary | ICD-10-CM | POA: Diagnosis not present

## 2022-12-24 DIAGNOSIS — Z992 Dependence on renal dialysis: Secondary | ICD-10-CM | POA: Diagnosis not present

## 2022-12-24 DIAGNOSIS — N186 End stage renal disease: Secondary | ICD-10-CM | POA: Diagnosis not present

## 2022-12-25 DIAGNOSIS — E119 Type 2 diabetes mellitus without complications: Secondary | ICD-10-CM | POA: Diagnosis not present

## 2022-12-25 DIAGNOSIS — Z992 Dependence on renal dialysis: Secondary | ICD-10-CM | POA: Diagnosis not present

## 2022-12-25 DIAGNOSIS — D689 Coagulation defect, unspecified: Secondary | ICD-10-CM | POA: Diagnosis not present

## 2022-12-25 DIAGNOSIS — N186 End stage renal disease: Secondary | ICD-10-CM | POA: Diagnosis not present

## 2022-12-25 DIAGNOSIS — N2581 Secondary hyperparathyroidism of renal origin: Secondary | ICD-10-CM | POA: Diagnosis not present

## 2022-12-27 ENCOUNTER — Other Ambulatory Visit: Payer: Self-pay | Admitting: "Endocrinology

## 2022-12-28 DIAGNOSIS — N2581 Secondary hyperparathyroidism of renal origin: Secondary | ICD-10-CM | POA: Diagnosis not present

## 2022-12-28 DIAGNOSIS — D689 Coagulation defect, unspecified: Secondary | ICD-10-CM | POA: Diagnosis not present

## 2022-12-28 DIAGNOSIS — E119 Type 2 diabetes mellitus without complications: Secondary | ICD-10-CM | POA: Diagnosis not present

## 2022-12-28 DIAGNOSIS — N186 End stage renal disease: Secondary | ICD-10-CM | POA: Diagnosis not present

## 2022-12-28 DIAGNOSIS — Z992 Dependence on renal dialysis: Secondary | ICD-10-CM | POA: Diagnosis not present

## 2022-12-30 DIAGNOSIS — Z992 Dependence on renal dialysis: Secondary | ICD-10-CM | POA: Diagnosis not present

## 2022-12-30 DIAGNOSIS — D689 Coagulation defect, unspecified: Secondary | ICD-10-CM | POA: Diagnosis not present

## 2022-12-30 DIAGNOSIS — N186 End stage renal disease: Secondary | ICD-10-CM | POA: Diagnosis not present

## 2022-12-30 DIAGNOSIS — E119 Type 2 diabetes mellitus without complications: Secondary | ICD-10-CM | POA: Diagnosis not present

## 2022-12-30 DIAGNOSIS — N2581 Secondary hyperparathyroidism of renal origin: Secondary | ICD-10-CM | POA: Diagnosis not present

## 2023-01-01 DIAGNOSIS — N2581 Secondary hyperparathyroidism of renal origin: Secondary | ICD-10-CM | POA: Diagnosis not present

## 2023-01-01 DIAGNOSIS — Z992 Dependence on renal dialysis: Secondary | ICD-10-CM | POA: Diagnosis not present

## 2023-01-01 DIAGNOSIS — N186 End stage renal disease: Secondary | ICD-10-CM | POA: Diagnosis not present

## 2023-01-01 DIAGNOSIS — D689 Coagulation defect, unspecified: Secondary | ICD-10-CM | POA: Diagnosis not present

## 2023-01-01 DIAGNOSIS — E119 Type 2 diabetes mellitus without complications: Secondary | ICD-10-CM | POA: Diagnosis not present

## 2023-01-04 DIAGNOSIS — D689 Coagulation defect, unspecified: Secondary | ICD-10-CM | POA: Diagnosis not present

## 2023-01-04 DIAGNOSIS — Z992 Dependence on renal dialysis: Secondary | ICD-10-CM | POA: Diagnosis not present

## 2023-01-04 DIAGNOSIS — N2581 Secondary hyperparathyroidism of renal origin: Secondary | ICD-10-CM | POA: Diagnosis not present

## 2023-01-04 DIAGNOSIS — E119 Type 2 diabetes mellitus without complications: Secondary | ICD-10-CM | POA: Diagnosis not present

## 2023-01-04 DIAGNOSIS — N186 End stage renal disease: Secondary | ICD-10-CM | POA: Diagnosis not present

## 2023-01-06 DIAGNOSIS — N186 End stage renal disease: Secondary | ICD-10-CM | POA: Diagnosis not present

## 2023-01-06 DIAGNOSIS — E119 Type 2 diabetes mellitus without complications: Secondary | ICD-10-CM | POA: Diagnosis not present

## 2023-01-06 DIAGNOSIS — D689 Coagulation defect, unspecified: Secondary | ICD-10-CM | POA: Diagnosis not present

## 2023-01-06 DIAGNOSIS — Z992 Dependence on renal dialysis: Secondary | ICD-10-CM | POA: Diagnosis not present

## 2023-01-06 DIAGNOSIS — N2581 Secondary hyperparathyroidism of renal origin: Secondary | ICD-10-CM | POA: Diagnosis not present

## 2023-01-08 DIAGNOSIS — N186 End stage renal disease: Secondary | ICD-10-CM | POA: Diagnosis not present

## 2023-01-08 DIAGNOSIS — D689 Coagulation defect, unspecified: Secondary | ICD-10-CM | POA: Diagnosis not present

## 2023-01-08 DIAGNOSIS — E119 Type 2 diabetes mellitus without complications: Secondary | ICD-10-CM | POA: Diagnosis not present

## 2023-01-08 DIAGNOSIS — N2581 Secondary hyperparathyroidism of renal origin: Secondary | ICD-10-CM | POA: Diagnosis not present

## 2023-01-08 DIAGNOSIS — Z992 Dependence on renal dialysis: Secondary | ICD-10-CM | POA: Diagnosis not present

## 2023-01-11 DIAGNOSIS — Z992 Dependence on renal dialysis: Secondary | ICD-10-CM | POA: Diagnosis not present

## 2023-01-11 DIAGNOSIS — E119 Type 2 diabetes mellitus without complications: Secondary | ICD-10-CM | POA: Diagnosis not present

## 2023-01-11 DIAGNOSIS — D689 Coagulation defect, unspecified: Secondary | ICD-10-CM | POA: Diagnosis not present

## 2023-01-11 DIAGNOSIS — N186 End stage renal disease: Secondary | ICD-10-CM | POA: Diagnosis not present

## 2023-01-11 DIAGNOSIS — N2581 Secondary hyperparathyroidism of renal origin: Secondary | ICD-10-CM | POA: Diagnosis not present

## 2023-01-13 DIAGNOSIS — D689 Coagulation defect, unspecified: Secondary | ICD-10-CM | POA: Diagnosis not present

## 2023-01-13 DIAGNOSIS — N2581 Secondary hyperparathyroidism of renal origin: Secondary | ICD-10-CM | POA: Diagnosis not present

## 2023-01-13 DIAGNOSIS — Z992 Dependence on renal dialysis: Secondary | ICD-10-CM | POA: Diagnosis not present

## 2023-01-13 DIAGNOSIS — N186 End stage renal disease: Secondary | ICD-10-CM | POA: Diagnosis not present

## 2023-01-13 DIAGNOSIS — E119 Type 2 diabetes mellitus without complications: Secondary | ICD-10-CM | POA: Diagnosis not present

## 2023-01-14 DIAGNOSIS — T82858A Stenosis of vascular prosthetic devices, implants and grafts, initial encounter: Secondary | ICD-10-CM | POA: Diagnosis not present

## 2023-01-14 DIAGNOSIS — I871 Compression of vein: Secondary | ICD-10-CM | POA: Diagnosis not present

## 2023-01-14 DIAGNOSIS — N186 End stage renal disease: Secondary | ICD-10-CM | POA: Diagnosis not present

## 2023-01-14 DIAGNOSIS — Z992 Dependence on renal dialysis: Secondary | ICD-10-CM | POA: Diagnosis not present

## 2023-01-15 DIAGNOSIS — E119 Type 2 diabetes mellitus without complications: Secondary | ICD-10-CM | POA: Diagnosis not present

## 2023-01-15 DIAGNOSIS — N2581 Secondary hyperparathyroidism of renal origin: Secondary | ICD-10-CM | POA: Diagnosis not present

## 2023-01-15 DIAGNOSIS — Z992 Dependence on renal dialysis: Secondary | ICD-10-CM | POA: Diagnosis not present

## 2023-01-15 DIAGNOSIS — D689 Coagulation defect, unspecified: Secondary | ICD-10-CM | POA: Diagnosis not present

## 2023-01-15 DIAGNOSIS — N186 End stage renal disease: Secondary | ICD-10-CM | POA: Diagnosis not present

## 2023-01-17 ENCOUNTER — Ambulatory Visit: Payer: Self-pay | Admitting: *Deleted

## 2023-01-17 ENCOUNTER — Encounter: Payer: Self-pay | Admitting: *Deleted

## 2023-01-17 NOTE — Patient Outreach (Signed)
Care Coordination   Follow Up Visit Note   01/17/2023 Name: Kyle Hicks MRN: [id number] DOB: 1940/09/22  Kyle Hicks is a 82 y.o. year old male who sees Assunta Found, MD for primary care. I spoke with  Janae Bridgeman by phone today.  What matters to the patients health and wellness today?  Managing blood sugar and finding transportation to appointments outside of [facility]    Goals Addressed             This Visit's Progress    Manage Blood Sugar       Care Coordination Goals: Patient will follow-up with PCP and/or endocrinologist every 3 months or as recommended Patient will take medication as prescribed and reach out to provider with any negative side effects Patient will monitor and record blood sugar daily and as needed with glucometer, and will call endocrinologist with any readings outside of recommended range Blood sugar is checked at dialysis. Patient often doesn't check it at home Patient will take blood sugar log and meter to provider visits for review Patient will check feet daily for sores, wounds, calluses, etc and will notify provider of any abnormal findings Patient will have yearly eye exams to check for or monitor diabetic retinopathy Patient will reach out to RN Care Coordinator [phone] with any care coordination or resource needs         SDOH assessments and interventions completed:  Yes  SDOH Interventions Today    Flowsheet Row Most Recent Value  SDOH Interventions   Food Insecurity Interventions Intervention Not Indicated  Housing Interventions Intervention Not Indicated  Transportation Interventions Patient Resources (Friends/Family), Other (Comment)  [Prior Amb referral. Sometimes has difficulty with transportation to appts outside of [facility]. Medicare Plan does not offer a transportation benefit and he doesn't have Medicaid.]  Utilities Interventions Intervention Not Indicated  Financial Strain Interventions  Intervention Not Indicated  Physical Activity Interventions Patient Declined        Care Coordination Interventions:  Yes, provided  Interventions Today    Flowsheet Row Most Recent Value  Chronic Disease   Chronic disease during today's visit Diabetes  General Interventions   General Interventions Discussed/Reviewed General Interventions Discussed, General Interventions Reviewed, Labs, Annual Foot Exam, Annual Eye Exam, Community Resources, Horticulturist, commercial (DME), Doctor Visits  [Patient has blood sugar checked at dialysis. No readings available but reports they are "good." Doesn't check at home very often.]  Labs Hgb A1c every 3 months  Doctor Visits Discussed/Reviewed Doctor Visits Discussed, Doctor Visits Reviewed, Specialist, PCP  [reviewed endocrinology notes]  Durable Medical Equipment (DME) Glucomoter  PCP/Specialist Visits Compliance with follow-up visit  [01/19/23 with Dr Fransico Him. Dialysis Tues, Thurs, Sat]  Exercise Interventions   Exercise Discussed/Reviewed Physical Activity  Physical Activity Discussed/Reviewed Physical Activity Discussed, Physical Activity Reviewed  Education Interventions   Education Provided Provided Education  Provided Verbal Education On Nutrition, Foot Care, Eye Care, Labs, Blood Sugar Monitoring, Exercise, Medication, When to see the doctor, Mental Health/Coping with Illness, Community Resources  Labs Reviewed Hgb A1c  [A1C 6.8 08/2022]  Nutrition Interventions   Nutrition Discussed/Reviewed Nutrition Discussed, Nutrition Reviewed, Carbohydrate meal planning, Fluid intake, Portion sizes  Pharmacy Interventions   Pharmacy Dicussed/Reviewed Pharmacy Topics Discussed, Pharmacy Topics Reviewed, Medications and their functions  Safety Interventions   Safety Discussed/Reviewed Safety Discussed, Fall Risk, Safety Reviewed, Home Safety       Follow up plan: Follow up call scheduled for 01/31/23    Encounter Outcome:  Pt. Visit  Completed   Demetrios Loll, BSN, RN-BC RN Care Coordinator [facility]  [facility] Direct Dial: [phone] Main #: [phone]

## 2023-01-18 DIAGNOSIS — Z992 Dependence on renal dialysis: Secondary | ICD-10-CM | POA: Diagnosis not present

## 2023-01-18 DIAGNOSIS — N2581 Secondary hyperparathyroidism of renal origin: Secondary | ICD-10-CM | POA: Diagnosis not present

## 2023-01-18 DIAGNOSIS — N186 End stage renal disease: Secondary | ICD-10-CM | POA: Diagnosis not present

## 2023-01-18 DIAGNOSIS — D689 Coagulation defect, unspecified: Secondary | ICD-10-CM | POA: Diagnosis not present

## 2023-01-18 DIAGNOSIS — E119 Type 2 diabetes mellitus without complications: Secondary | ICD-10-CM | POA: Diagnosis not present

## 2023-01-19 ENCOUNTER — Ambulatory Visit: Payer: Medicare HMO | Admitting: "Endocrinology

## 2023-01-20 DIAGNOSIS — N186 End stage renal disease: Secondary | ICD-10-CM | POA: Diagnosis not present

## 2023-01-20 DIAGNOSIS — E119 Type 2 diabetes mellitus without complications: Secondary | ICD-10-CM | POA: Diagnosis not present

## 2023-01-20 DIAGNOSIS — Z992 Dependence on renal dialysis: Secondary | ICD-10-CM | POA: Diagnosis not present

## 2023-01-20 DIAGNOSIS — D689 Coagulation defect, unspecified: Secondary | ICD-10-CM | POA: Diagnosis not present

## 2023-01-20 DIAGNOSIS — N2581 Secondary hyperparathyroidism of renal origin: Secondary | ICD-10-CM | POA: Diagnosis not present

## 2023-01-22 DIAGNOSIS — Z992 Dependence on renal dialysis: Secondary | ICD-10-CM | POA: Diagnosis not present

## 2023-01-22 DIAGNOSIS — D689 Coagulation defect, unspecified: Secondary | ICD-10-CM | POA: Diagnosis not present

## 2023-01-22 DIAGNOSIS — N2581 Secondary hyperparathyroidism of renal origin: Secondary | ICD-10-CM | POA: Diagnosis not present

## 2023-01-22 DIAGNOSIS — N186 End stage renal disease: Secondary | ICD-10-CM | POA: Diagnosis not present

## 2023-01-22 DIAGNOSIS — E119 Type 2 diabetes mellitus without complications: Secondary | ICD-10-CM | POA: Diagnosis not present

## 2023-01-23 DIAGNOSIS — N186 End stage renal disease: Secondary | ICD-10-CM | POA: Diagnosis not present

## 2023-01-23 DIAGNOSIS — I150 Renovascular hypertension: Secondary | ICD-10-CM | POA: Diagnosis not present

## 2023-01-23 DIAGNOSIS — Z992 Dependence on renal dialysis: Secondary | ICD-10-CM | POA: Diagnosis not present

## 2023-01-25 DIAGNOSIS — D509 Iron deficiency anemia, unspecified: Secondary | ICD-10-CM | POA: Diagnosis not present

## 2023-01-25 DIAGNOSIS — Z992 Dependence on renal dialysis: Secondary | ICD-10-CM | POA: Diagnosis not present

## 2023-01-25 DIAGNOSIS — D631 Anemia in chronic kidney disease: Secondary | ICD-10-CM | POA: Diagnosis not present

## 2023-01-25 DIAGNOSIS — N186 End stage renal disease: Secondary | ICD-10-CM | POA: Diagnosis not present

## 2023-01-25 DIAGNOSIS — N2581 Secondary hyperparathyroidism of renal origin: Secondary | ICD-10-CM | POA: Diagnosis not present

## 2023-01-25 DIAGNOSIS — E8779 Other fluid overload: Secondary | ICD-10-CM | POA: Diagnosis not present

## 2023-01-25 DIAGNOSIS — E119 Type 2 diabetes mellitus without complications: Secondary | ICD-10-CM | POA: Diagnosis not present

## 2023-01-25 DIAGNOSIS — D689 Coagulation defect, unspecified: Secondary | ICD-10-CM | POA: Diagnosis not present

## 2023-01-27 DIAGNOSIS — N2581 Secondary hyperparathyroidism of renal origin: Secondary | ICD-10-CM | POA: Diagnosis not present

## 2023-01-27 DIAGNOSIS — D509 Iron deficiency anemia, unspecified: Secondary | ICD-10-CM | POA: Diagnosis not present

## 2023-01-27 DIAGNOSIS — E8779 Other fluid overload: Secondary | ICD-10-CM | POA: Diagnosis not present

## 2023-01-27 DIAGNOSIS — D631 Anemia in chronic kidney disease: Secondary | ICD-10-CM | POA: Diagnosis not present

## 2023-01-27 DIAGNOSIS — E119 Type 2 diabetes mellitus without complications: Secondary | ICD-10-CM | POA: Diagnosis not present

## 2023-01-27 DIAGNOSIS — D689 Coagulation defect, unspecified: Secondary | ICD-10-CM | POA: Diagnosis not present

## 2023-01-27 DIAGNOSIS — N186 End stage renal disease: Secondary | ICD-10-CM | POA: Diagnosis not present

## 2023-01-27 DIAGNOSIS — Z992 Dependence on renal dialysis: Secondary | ICD-10-CM | POA: Diagnosis not present

## 2023-01-29 DIAGNOSIS — N2581 Secondary hyperparathyroidism of renal origin: Secondary | ICD-10-CM | POA: Diagnosis not present

## 2023-01-29 DIAGNOSIS — E8779 Other fluid overload: Secondary | ICD-10-CM | POA: Diagnosis not present

## 2023-01-29 DIAGNOSIS — Z992 Dependence on renal dialysis: Secondary | ICD-10-CM | POA: Diagnosis not present

## 2023-01-29 DIAGNOSIS — D631 Anemia in chronic kidney disease: Secondary | ICD-10-CM | POA: Diagnosis not present

## 2023-01-29 DIAGNOSIS — N186 End stage renal disease: Secondary | ICD-10-CM | POA: Diagnosis not present

## 2023-01-29 DIAGNOSIS — D509 Iron deficiency anemia, unspecified: Secondary | ICD-10-CM | POA: Diagnosis not present

## 2023-01-29 DIAGNOSIS — D689 Coagulation defect, unspecified: Secondary | ICD-10-CM | POA: Diagnosis not present

## 2023-01-29 DIAGNOSIS — E119 Type 2 diabetes mellitus without complications: Secondary | ICD-10-CM | POA: Diagnosis not present

## 2023-01-31 ENCOUNTER — Encounter: Payer: Self-pay | Admitting: *Deleted

## 2023-01-31 ENCOUNTER — Ambulatory Visit: Payer: Self-pay | Admitting: *Deleted

## 2023-01-31 NOTE — Patient Outreach (Signed)
  Care Coordination   Follow Up Visit Note   01/31/2023 Name: Kyle Hicks MRN: [id number] DOB: Sep 02, 1940  Kyle Hicks is a 82 y.o. year old male who sees Assunta Found, MD for primary care. I  spoke with wife, Kyle Hicks, by telephone today.  What matters to the patients health and wellness today?  Managing blood sugar and dialysis    Goals Addressed             This Visit's Progress    Manage Blood Sugar   On track    Care Coordination Goals: Patient will follow-up with PCP and/or endocrinologist every 3 months or as recommended Appt scheduled for 02/09/23 Verified transportation to appt Patient will take medication as prescribed and reach out to provider with any negative side effects Patient will monitor and record blood sugar daily and as needed with glucometer, and will call endocrinologist with any readings outside of recommended range Blood sugar is checked at dialysis and patient checks it on off days at home Patient will take blood sugar log and meter to provider visits for review Patient will check feet daily for sores, wounds, calluses, etc and will notify provider of any abnormal findings Patient will have yearly eye exams to check for or monitor diabetic retinopathy Patient will reach out to RN Care Coordinator [phone] with any care coordination or resource needs         SDOH assessments and interventions completed:  Yes  SDOH Interventions Today    Flowsheet Row Most Recent Value  SDOH Interventions   Transportation Interventions Patient Resources (Friends/Family)  Physical Activity Interventions Patient Declined        Care Coordination Interventions:  Yes, provided  Interventions Today    Flowsheet Row Most Recent Value  Chronic Disease   Chronic disease during today's visit Diabetes  General Interventions   General Interventions Discussed/Reviewed General Interventions Discussed, General Interventions Reviewed, Durable Medical Equipment  (DME), Labs, Doctor Visits  Labs Hgb A1c every 3 months  Doctor Visits Discussed/Reviewed Doctor Visits Discussed, Doctor Visits Reviewed, PCP, Specialist  Durable Medical Equipment (DME) Glucomoter  PCP/Specialist Visits Compliance with follow-up visit  [endocrinologist on 02/09/23]  Exercise Interventions   Exercise Discussed/Reviewed Physical Activity  Physical Activity Discussed/Reviewed Physical Activity Discussed, Physical Activity Reviewed  Education Interventions   Education Provided Provided Education  Provided Verbal Education On Mental Health/Coping with Illness, When to see the doctor, Medication, Exercise, Blood Sugar Monitoring, Labs, Nutrition  Labs Reviewed Hgb A1c  [6.8 in Feb 2024]  Nutrition Interventions   Nutrition Discussed/Reviewed Nutrition Discussed, Nutrition Reviewed, Fluid intake  Pharmacy Interventions   Pharmacy Dicussed/Reviewed Pharmacy Topics Discussed, Pharmacy Topics Reviewed, Medications and their functions  Safety Interventions   Safety Discussed/Reviewed Safety Reviewed, Safety Discussed, Fall Risk, Home Safety       Follow up plan: Follow up call scheduled for 03/04/23    Encounter Outcome:  Pt. Visit Completed   Demetrios Loll, BSN, RN-BC RN Care Coordinator [facility]  [facility] Direct Dial: [phone] Main #: [phone]

## 2023-02-01 DIAGNOSIS — E8779 Other fluid overload: Secondary | ICD-10-CM | POA: Diagnosis not present

## 2023-02-01 DIAGNOSIS — D631 Anemia in chronic kidney disease: Secondary | ICD-10-CM | POA: Diagnosis not present

## 2023-02-01 DIAGNOSIS — N2581 Secondary hyperparathyroidism of renal origin: Secondary | ICD-10-CM | POA: Diagnosis not present

## 2023-02-01 DIAGNOSIS — Z992 Dependence on renal dialysis: Secondary | ICD-10-CM | POA: Diagnosis not present

## 2023-02-01 DIAGNOSIS — D689 Coagulation defect, unspecified: Secondary | ICD-10-CM | POA: Diagnosis not present

## 2023-02-01 DIAGNOSIS — E119 Type 2 diabetes mellitus without complications: Secondary | ICD-10-CM | POA: Diagnosis not present

## 2023-02-01 DIAGNOSIS — D509 Iron deficiency anemia, unspecified: Secondary | ICD-10-CM | POA: Diagnosis not present

## 2023-02-01 DIAGNOSIS — N186 End stage renal disease: Secondary | ICD-10-CM | POA: Diagnosis not present

## 2023-02-02 DIAGNOSIS — T82858A Stenosis of vascular prosthetic devices, implants and grafts, initial encounter: Secondary | ICD-10-CM | POA: Diagnosis not present

## 2023-02-02 DIAGNOSIS — Z992 Dependence on renal dialysis: Secondary | ICD-10-CM | POA: Diagnosis not present

## 2023-02-02 DIAGNOSIS — N186 End stage renal disease: Secondary | ICD-10-CM | POA: Diagnosis not present

## 2023-02-03 DIAGNOSIS — Z992 Dependence on renal dialysis: Secondary | ICD-10-CM | POA: Diagnosis not present

## 2023-02-03 DIAGNOSIS — E119 Type 2 diabetes mellitus without complications: Secondary | ICD-10-CM | POA: Diagnosis not present

## 2023-02-03 DIAGNOSIS — D689 Coagulation defect, unspecified: Secondary | ICD-10-CM | POA: Diagnosis not present

## 2023-02-03 DIAGNOSIS — N186 End stage renal disease: Secondary | ICD-10-CM | POA: Diagnosis not present

## 2023-02-03 DIAGNOSIS — D631 Anemia in chronic kidney disease: Secondary | ICD-10-CM | POA: Diagnosis not present

## 2023-02-03 DIAGNOSIS — N2581 Secondary hyperparathyroidism of renal origin: Secondary | ICD-10-CM | POA: Diagnosis not present

## 2023-02-03 DIAGNOSIS — E8779 Other fluid overload: Secondary | ICD-10-CM | POA: Diagnosis not present

## 2023-02-03 DIAGNOSIS — D509 Iron deficiency anemia, unspecified: Secondary | ICD-10-CM | POA: Diagnosis not present

## 2023-02-05 DIAGNOSIS — Z992 Dependence on renal dialysis: Secondary | ICD-10-CM | POA: Diagnosis not present

## 2023-02-05 DIAGNOSIS — D509 Iron deficiency anemia, unspecified: Secondary | ICD-10-CM | POA: Diagnosis not present

## 2023-02-05 DIAGNOSIS — N186 End stage renal disease: Secondary | ICD-10-CM | POA: Diagnosis not present

## 2023-02-05 DIAGNOSIS — D689 Coagulation defect, unspecified: Secondary | ICD-10-CM | POA: Diagnosis not present

## 2023-02-05 DIAGNOSIS — E119 Type 2 diabetes mellitus without complications: Secondary | ICD-10-CM | POA: Diagnosis not present

## 2023-02-05 DIAGNOSIS — E8779 Other fluid overload: Secondary | ICD-10-CM | POA: Diagnosis not present

## 2023-02-05 DIAGNOSIS — D631 Anemia in chronic kidney disease: Secondary | ICD-10-CM | POA: Diagnosis not present

## 2023-02-05 DIAGNOSIS — N2581 Secondary hyperparathyroidism of renal origin: Secondary | ICD-10-CM | POA: Diagnosis not present

## 2023-02-08 DIAGNOSIS — D689 Coagulation defect, unspecified: Secondary | ICD-10-CM | POA: Diagnosis not present

## 2023-02-08 DIAGNOSIS — D509 Iron deficiency anemia, unspecified: Secondary | ICD-10-CM | POA: Diagnosis not present

## 2023-02-08 DIAGNOSIS — N186 End stage renal disease: Secondary | ICD-10-CM | POA: Diagnosis not present

## 2023-02-08 DIAGNOSIS — E8779 Other fluid overload: Secondary | ICD-10-CM | POA: Diagnosis not present

## 2023-02-08 DIAGNOSIS — E119 Type 2 diabetes mellitus without complications: Secondary | ICD-10-CM | POA: Diagnosis not present

## 2023-02-08 DIAGNOSIS — D631 Anemia in chronic kidney disease: Secondary | ICD-10-CM | POA: Diagnosis not present

## 2023-02-08 DIAGNOSIS — N2581 Secondary hyperparathyroidism of renal origin: Secondary | ICD-10-CM | POA: Diagnosis not present

## 2023-02-08 DIAGNOSIS — Z992 Dependence on renal dialysis: Secondary | ICD-10-CM | POA: Diagnosis not present

## 2023-02-09 ENCOUNTER — Ambulatory Visit (INDEPENDENT_AMBULATORY_CARE_PROVIDER_SITE_OTHER): Payer: Medicare HMO | Admitting: "Endocrinology

## 2023-02-09 ENCOUNTER — Encounter: Payer: Self-pay | Admitting: "Endocrinology

## 2023-02-09 VITALS — BP 80/42 | HR 64 | Ht 66.5 in | Wt 201.0 lb

## 2023-02-09 DIAGNOSIS — I10 Essential (primary) hypertension: Secondary | ICD-10-CM | POA: Diagnosis not present

## 2023-02-09 DIAGNOSIS — E1122 Type 2 diabetes mellitus with diabetic chronic kidney disease: Secondary | ICD-10-CM | POA: Diagnosis not present

## 2023-02-09 DIAGNOSIS — E782 Mixed hyperlipidemia: Secondary | ICD-10-CM

## 2023-02-09 DIAGNOSIS — N186 End stage renal disease: Secondary | ICD-10-CM

## 2023-02-09 DIAGNOSIS — E669 Obesity, unspecified: Secondary | ICD-10-CM | POA: Diagnosis not present

## 2023-02-09 LAB — POCT GLYCOSYLATED HEMOGLOBIN (HGB A1C): HbA1c, POC (controlled diabetic range): 7.6 % — AB (ref 0.0–7.0)

## 2023-02-09 MED ORDER — LINAGLIPTIN 5 MG PO TABS: 5.0000 mg | ORAL_TABLET | Freq: Every day | ORAL | 1 refills | Status: DC

## 2023-02-09 NOTE — Patient Instructions (Signed)
                                      Advice for Weight Management  -For most of us the best way to lose weight is by diet management. Generally speaking, diet management means consuming less calories intentionally which over time brings about progressive weight loss.  This can be achieved more effectively by avoiding ultra processed carbohydrates, processed meats, unhealthy fats.    It is critically important to know your numbers: how much calorie you are consuming and how much calorie you need. More importantly, our carbohydrates sources should be unprocessed naturally occurring  complex starch food items.  It is always important to balance nutrition also by  appropriate intake of proteins (mainly plant-based), healthy fats/oils, plenty of fruits and vegetables.   -The American College of Lifestyle Medicine (ACL M) recommends nutrition derived mostly from Whole Food, Plant Predominant Sources example an apple instead of applesauce or apple pie. Eat Plenty of vegetables, Mushrooms, fruits, Legumes, Whole Grains, Nuts, seeds in lieu of processed meats, processed snacks/pastries red meat, poultry, eggs.  Use only water or unsweetened tea for hydration.  The College also recommends the need to stay away from risky substances including alcohol, smoking; obtaining 7-9 hours of restorative sleep, at least 150 minutes of moderate intensity exercise weekly, importance of healthy social connections, and being mindful of stress and seek help when it is overwhelming.    -Sticking to a routine mealtime to eat 3 meals a day and avoiding unnecessary snacks is shown to have a big role in weight control. Under normal circumstances, the only time we burn stored energy is when we are hungry, so allow  some hunger to take place- hunger means no food between appropriate meal times, only water.  It is not advisable to starve.   -It is better to avoid simple carbohydrates including:  Cakes, Sweet Desserts, Ice Cream, Soda (diet and regular), Sweet Tea, Candies, Chips, Cookies, Store Bought Juices, Alcohol in Excess of  1-2 drinks a day, Lemonade,  Artificial Sweeteners, Doughnuts, Coffee Creamers, "Sugar-free" Products, etc, etc.  This is not a complete list.....    -Consulting with certified diabetes educators is proven to provide you with the most accurate and current information on diet.  Also, you may be  interested in discussing diet options/exchanges , we can schedule a visit with Kyle Hicks, RDN, CDE for individualized nutrition education.  -Exercise: If you are able: 30 -60 minutes a day ,4 days a week, or 150 minutes of moderate intensity exercise weekly.    The longer the better if tolerated.  Combine stretch, strength, and aerobic activities.  If you were told in the past that you have high risk for cardiovascular diseases, or if you are currently symptomatic, you may seek evaluation by your heart doctor prior to initiating moderate to intense exercise programs.                                  Additional Care Considerations for Diabetes/Prediabetes   -Diabetes  is a chronic disease.  The most important care consideration is regular follow-up with your diabetes care provider with the goal being avoiding or delaying its complications and to take advantage of advances in medications and technology.  If appropriate actions are taken early enough, type 2 diabetes can even be   reversed.  Seek information from the right source.  - Whole Food, Plant Predominant Nutrition is highly recommended: Eat Plenty of vegetables, Mushrooms, fruits, Legumes, Whole Grains, Nuts, seeds in lieu of processed meats, processed snacks/pastries red meat, poultry, eggs as recommended by American College of  Lifestyle Medicine (ACLM).  -Type 2 diabetes is known to coexist with other important comorbidities such as high blood pressure and high cholesterol.  It is critical to control not only the  diabetes but also the high blood pressure and high cholesterol to minimize and delay the risk of complications including coronary artery disease, stroke, amputations, blindness, etc.  The good news is that this diet recommendation for type 2 diabetes is also very helpful for managing high cholesterol and high blood blood pressure.  - Studies showed that people with diabetes will benefit from a class of medications known as ACE inhibitors and statins.  Unless there are specific reasons not to be on these medications, the standard of care is to consider getting one from these groups of medications at an optimal doses.  These medications are generally considered safe and proven to help protect the heart and the kidneys.    - People with diabetes are encouraged to initiate and maintain regular follow-up with eye doctors, foot doctors, dentists , and if necessary heart and kidney doctors.     - It is highly recommended that people with diabetes quit smoking or stay away from smoking, and get yearly  flu vaccine and pneumonia vaccine at least every 5 years.  See above for additional recommendations on exercise, sleep, stress management , and healthy social connections.      

## 2023-02-09 NOTE — Progress Notes (Signed)
02/09/2023  Endocrinology follow-up note   Subjective:    Patient ID: Kyle Hicks, male    DOB: 02-04-1941, PCP Assunta Found, MD   Past Medical History:  Diagnosis Date   Chronic kidney disease    Diabetes mellitus without complication [facility])    Dialysis patient [facility])    Diastolic CHF, acute (HCC) 10/08/2020   HTN (hypertension)    Past Surgical History:  Procedure Laterality Date   AV FISTULA PLACEMENT Left 01/22/2021   Procedure: LEFT UPPER ARM ARTERIOVENOUS GORE-TEX GRAFT;  Surgeon: Larina Earthly, MD;  Location: [facility] ORS;  Service: Vascular;  Laterality: Left;   CARDIOVERSION N/A 10/10/2020   Procedure: CARDIOVERSION;  Surgeon: Lewayne Bunting, MD;  Location: [facility] ENDOSCOPY;  Service: Cardiovascular;  Laterality: N/A;   ESOPHAGOGASTRODUODENOSCOPY N/A 04/27/2017   Procedure: ESOPHAGOGASTRODUODENOSCOPY (EGD);  Surgeon: Malissa Hippo, MD;  Location: [facility] ENDO SUITE;  Service: Endoscopy;  Laterality: N/A;   IR FLUORO GUIDE CV LINE RIGHT  10/23/2020   IR US GUIDANCE  10/23/2020   RIGHT HEART CATH N/A 10/16/2020   Procedure: RIGHT HEART CATH;  Surgeon: Dolores Patty, MD;  Location: [facility] INVASIVE CV LAB;  Service: Cardiovascular;  Laterality: N/A;   RIGHT HEART CATH AND CORONARY ANGIOGRAPHY N/A 10/24/2020   Procedure: RIGHT HEART CATH AND CORONARY ANGIOGRAPHY;  Surgeon: Dolores Patty, MD;  Location: [facility] INVASIVE CV LAB;  Service: Cardiovascular;  Laterality: N/A;   TEE WITHOUT CARDIOVERSION N/A 10/10/2020   Procedure: TRANSESOPHAGEAL ECHOCARDIOGRAM (TEE);  Surgeon: Lewayne Bunting, MD;  Location: [facility] ENDOSCOPY;  Service: Cardiovascular;  Laterality: N/A;   TEMPORARY DIALYSIS CATHETER  10/16/2020   Procedure: TEMPORARY DIALYSIS CATHETER;  Surgeon: Dolores Patty, MD;  Location: [facility] INVASIVE CV LAB;  Service: Cardiovascular;;   Social History   Socioeconomic History   Marital status: Married    Spouse name: Not on file   Number of children: Not on file   Years of education:  Not on file   Highest education level: Not on file  Occupational History   Not on file  Tobacco Use   Smoking status: Former    Current packs/day: 0.00    Types: Cigarettes    Quit date: 07/26/1987    Years since quitting: 35.5   Smokeless tobacco: Never  Vaping Use   Vaping status: Never Used  Substance and Sexual Activity   Alcohol use: No    Alcohol/week: 0.0 standard drinks of alcohol    Comment: Previously drank socially, last drank 25-30 years ago   Drug use: No   Sexual activity: Not on file  Other Topics Concern   Not on file  Social History Narrative   Not on file   Social Determinants of Health   Financial Resource Strain: Low Risk  (01/17/2023)   Overall Financial Resource Strain (CARDIA)    Difficulty of Paying Living Expenses: Not very hard  Food Insecurity: No Food Insecurity (01/17/2023)   Hunger Vital Sign    Worried About Running Out of Food in the Last Year: Never true    Ran Out of Food in the Last Year: Never true  Transportation Needs: No Transportation Needs (01/31/2023)   PRAPARE - Administrator, Civil Service (Medical): No    Lack of Transportation (Non-Medical): No  Physical Activity: Inactive (01/31/2023)   Exercise Vital Sign    Days of Exercise per Week: 0 days    Minutes of Exercise per Session: 0 min  Stress: Not on file  Social  Connections: Not on file   Outpatient Encounter Medications as of 02/09/2023  Medication Sig   linagliptin (TRADJENTA) 5 MG TABS tablet Take 1 tablet (5 mg total) by mouth daily.   amiodarone (PACERONE) 100 MG tablet Take 1 tablet (100 mg total) by mouth daily.   apixaban (ELIQUIS) 5 MG TABS tablet Take 1 tablet (5 mg total) by mouth 2 (two) times daily.   atorvastatin (LIPITOR) 20 MG tablet Take 1 tablet (20 mg total) by mouth daily.   blood glucose meter kit and supplies KIT Dispense based on patient and insurance preference. Use up to 2 times daily as directed. (FOR ICD-10 E11.65)   Blood Glucose  Monitoring Suppl (ONETOUCH VERIO FLEX SYSTEM) w/Device KIT Use to test blood glucose twice daily. Dx Code: E11.65   glipiZIDE (GLUCOTROL) 5 MG tablet Take 1 tablet by mouth once daily with breakfast   glucose blood (ONETOUCH VERIO) test strip Use as instructed to check blood glucose twice daily. Dx Code: E11.65   hydrocortisone cream 1 % Apply 1 application topically 3 (three) times daily as needed for itching (minor skin irritation).   [name] CARBONATE PO 500 mg. Patient is taking 500 mg tablet (chewable)3 times daily.   midodrine (PROAMATINE) 10 MG tablet Take 1 tablet (10 mg total) by mouth 3 (three) times a week.   OneTouch Delica Lancets 33G MISC Use to check blood glucose twice daily. Dx Code: E11.65   oxyCODONE-acetaminophen (PERCOCET) 5-325 MG tablet Take 1 tablet by mouth every 6 (six) hours as needed for severe pain. (Patient not taking: Reported on 08/14/2021)   Polyethyl Glycol-Propyl Glycol (SYSTANE) 0.4-0.3 % SOLN Place 1 drop into both eyes daily as needed (Eye irritation).   torsemide (DEMADEX) 100 MG tablet Take 1 tablet (100 mg total) by mouth 3 (three) times a week. MONDAY WEDNESDAY FRIDAY   No facility-administered encounter medications on file as of 02/09/2023.   ALLERGIES: No Known Allergies VACCINATION STATUS: Immunization History  Administered Date(s) Administered   Moderna Sars-Covid-2 Vaccination 09/23/2019, 10/27/2019    Diabetes He presents for his follow-up diabetic visit. He has type 2 diabetes mellitus. The initial diagnosis of diabetes was made 2 years ago. His disease course has been stable. There are no hypoglycemic associated symptoms. Pertinent negatives for hypoglycemia include no confusion, headaches, pallor or seizures. There are no diabetic associated symptoms. Pertinent negatives for diabetes include no chest pain, no fatigue, no polydipsia, no polyphagia, no polyuria and no weakness. There are no hypoglycemic complications. Symptoms are improving.  Diabetic complications include nephropathy. Risk factors for coronary artery disease include diabetes mellitus, dyslipidemia, hypertension, male sex, obesity, sedentary lifestyle and tobacco exposure. He is compliant with treatment most of the time. His weight is fluctuating minimally (He lost approximately 60 pounds since last visit.). He is following a generally unhealthy diet. He has had a previous visit with a dietitian. He never participates in exercise. His home blood glucose trend is fluctuating minimally. (He brought a dysfunctional meter, out of  battery.  I changed the battery of his meter.  His point-of-care A1c is 7.6% increasing from 6.8% . he denies hypoglycemia.      ) An ACE inhibitor/angiotensin II receptor blocker is being taken.  Hypertension This is a chronic problem. The current episode started more than 1 year ago. The problem is uncontrolled. Pertinent negatives include no chest pain, headaches, neck pain, palpitations or shortness of breath. Risk factors for coronary artery disease include diabetes mellitus, dyslipidemia, obesity, sedentary lifestyle and smoking/tobacco exposure. Past  treatments include angiotensin blockers. Identifiable causes of hypertension include chronic renal disease.  Hyperlipidemia This is a chronic problem. The current episode started more than 1 year ago. Exacerbating diseases include chronic renal disease, diabetes and obesity. Pertinent negatives include no chest pain, myalgias or shortness of breath. Current antihyperlipidemic treatment includes statins. Risk factors for coronary artery disease include diabetes mellitus, dyslipidemia, hypertension, family history, male sex, obesity and a sedentary lifestyle.    Review of Systems  Constitutional:  Negative for fatigue and unexpected weight change.  HENT:  Negative for dental problem, mouth sores and trouble swallowing.   Eyes:  Negative for visual disturbance.  Respiratory:  Negative for cough,  choking, chest tightness, shortness of breath and wheezing.   Cardiovascular:  Negative for chest pain, palpitations and leg swelling.  Gastrointestinal:  Negative for abdominal distention, abdominal pain, constipation, diarrhea, nausea and vomiting.  Endocrine: Negative for polydipsia, polyphagia and polyuria.  Genitourinary:  Negative for dysuria, flank pain, hematuria and urgency.  Musculoskeletal:  Negative for back pain, gait problem, myalgias and neck pain.  Skin:  Negative for pallor, rash and wound.  Neurological:  Negative for seizures, syncope, weakness, numbness and headaches.  Psychiatric/Behavioral: Negative.  Negative for confusion and dysphoric mood.     Objective:    BP (!) 80/42   Pulse 64   Ht 5' 6.5" (1.689 m)   Wt 201 lb (91.2 kg)   BMI 31.96 kg/m   Wt Readings from Last 3 Encounters:  02/09/23 201 lb (91.2 kg)  09/20/22 200 lb 9.6 oz (91 kg)  06/23/22 200 lb (90.7 kg)      Results for orders placed or performed in visit on 02/09/23  HgB A1c  Result Value Ref Range   Hemoglobin A1C     HbA1c POC (<> result, manual entry)     HbA1c, POC (prediabetic range)     HbA1c, POC (controlled diabetic range) 7.6 (A) 0.0 - 7.0 %   Diabetic Labs (most recent): Lab Results  Component Value Date   HGBA1C 7.6 (A) 02/09/2023   HGBA1C 6.8 09/20/2022   HGBA1C 6.1 02/12/2022   MICROALBUR 142.0 12/04/2019   MICROALBUR 1.2 06/20/2017   Lipid Panel     Component Value Date/Time   CHOL 142 09/13/2022 0817   TRIG 64 09/13/2022 0817   HDL 64 09/13/2022 0817   CHOLHDL 2.2 09/13/2022 0817   CHOLHDL 3.1 04/08/2020 0724   VLDL 14 01/19/2016 0719   LDLCALC 65 09/13/2022 0817   LDLCALC 85 04/08/2020 0724     Assessment & Plan:   1. 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     - He remains at a high risk for more acute and chronic complications of diabetes which include CAD, CVA, retinopathy, and neuropathy. These are all discussed in detail  with the patient.  He brought a dysfunctional meter, out of  battery.  I changed the battery of his meter.  His point-of-care A1c is 7.6% increasing from 6.8% . he denies hypoglycemia.    - I have re-counseled the patient on diet management and weight loss  by adopting a carbohydrate restricted / protein rich  Diet.  - he acknowledges that there is a room for improvement in his food and drink choices. - Suggestion is made for him to avoid simple carbohydrates  from his diet including Cakes, Sweet Desserts, Ice Cream, Soda (diet and regular), Sweet Tea, Candies, Chips, Cookies, Store Bought Juices, Alcohol in Excess of  1-2 drinks  a day, Artificial Sweeteners,  Coffee Creamer, and "Sugar-free" Products, Lemonade. This will help patient to have more stable blood glucose profile and potentially avoid unintended weight gain.  - Patient is advised to stick to a routine mealtimes to eat 3 meals  a day and avoid unnecessary snacks ( to snack only to correct hypoglycemia).  - I have approached patient with the following individualized plan to manage diabetes and patient agrees.   -Emphasizing the need for monitoring blood glucose at least once a day before breakfast.  -He is advised to continue glipizide 5 mg XL p.o. daily at breakfast and I discussed and added Tradjenta 5 mg p.o. daily at breakfast .  -He is encouraged to call clinic for blood glucose readings below 70 or greater than 200 mg/dl at fasting. - Patient specific target  for A1c; LDL, HDL, Triglycerides,  were discussed in detail.  2) BP/HTN: His blood pressure remains marginal at 80/42.  Patient is asymptomatic.  He was taken off of Coreg last visit.  Blood pressure management deferred to nephrology.  He is also  torsemide 100 mg p.o. daily.  He is on midodrine for orthostatic hypotension.   3) Lipids/HPL: His recent lipid panel showed controlled LDL at 65.  He is advised to continue with atorvastatin 20 mg p.o. nightly.   Side effects  and precautions discussed with him.    4)  Weight/Diet: His BMI is 31.96--  He has lost weight related to initiation of hemodialysis.   5) Chronic Care/Health Maintenance:  -Patient is on ACEI/ARB and Statin medications and encouraged to continue to follow up with Ophthalmology, nephrology, cardiology, podiatrist at least yearly or according to recommendations, and advised to stay away from smoking. I have recommended yearly flu vaccine and pneumonia vaccination at least every 5 years; moderate intensity exercise for up to 150 minutes weekly; and  sleep for at least 7 hours a day.  He is currently on amiodarone related to his recent diagnosis of tachyarrhythmia-atrial flutter/atrial fibrillation.  He will need thyroid function test at least twice a year.  His last thyroid function test from August 07, 2021 showed TSH 1.16, free T4 1.75, this is consistent with euthyroid presentation.     - I advised patient to maintain close follow up with Assunta Found, MD for primary care needs.   I spent  26  minutes in the care of the patient today including review of labs from CMP, Lipids, Thyroid Function, Hematology (current and previous including abstractions from other facilities); face-to-face time discussing  his blood glucose readings/logs, discussing hypoglycemia and hyperglycemia episodes and symptoms, medications doses, his options of short and long term treatment based on the latest standards of care / guidelines;  discussion about incorporating lifestyle medicine;  and documenting the encounter. Risk reduction counseling performed per USPSTF guidelines to reduce  obesity and cardiovascular risk factors.     Please refer to Patient Instructions for Blood Glucose Monitoring and Insulin/Medications Dosing Guide"  in media tab for additional information. Please  also refer to " Patient Self Inventory" in the Media  tab for reviewed elements of pertinent patient history.  Kyle Hicks participated  in the discussions, expressed understanding, and voiced agreement with the above plans.  All questions were answered to his satisfaction. he is encouraged to contact clinic should he have any questions or concerns prior to his return visit.   Follow up plan: -Return in about 4 months (around 06/12/2023) for Bring Meter/CGM Device/Logs- A1c in Office.  Marquis Lunch, MD Phone: [phone]  Fax: [phone]  -  This note was partially dictated with voice recognition software. Similar sounding words can be transcribed inadequately or may not  be corrected upon review.  02/09/2023, 11:48 AM

## 2023-02-10 DIAGNOSIS — E8779 Other fluid overload: Secondary | ICD-10-CM | POA: Diagnosis not present

## 2023-02-10 DIAGNOSIS — N2581 Secondary hyperparathyroidism of renal origin: Secondary | ICD-10-CM | POA: Diagnosis not present

## 2023-02-10 DIAGNOSIS — E119 Type 2 diabetes mellitus without complications: Secondary | ICD-10-CM | POA: Diagnosis not present

## 2023-02-10 DIAGNOSIS — N186 End stage renal disease: Secondary | ICD-10-CM | POA: Diagnosis not present

## 2023-02-10 DIAGNOSIS — D689 Coagulation defect, unspecified: Secondary | ICD-10-CM | POA: Diagnosis not present

## 2023-02-10 DIAGNOSIS — D509 Iron deficiency anemia, unspecified: Secondary | ICD-10-CM | POA: Diagnosis not present

## 2023-02-10 DIAGNOSIS — D631 Anemia in chronic kidney disease: Secondary | ICD-10-CM | POA: Diagnosis not present

## 2023-02-10 DIAGNOSIS — Z992 Dependence on renal dialysis: Secondary | ICD-10-CM | POA: Diagnosis not present

## 2023-02-11 DIAGNOSIS — E119 Type 2 diabetes mellitus without complications: Secondary | ICD-10-CM | POA: Diagnosis not present

## 2023-02-11 DIAGNOSIS — D689 Coagulation defect, unspecified: Secondary | ICD-10-CM | POA: Diagnosis not present

## 2023-02-11 DIAGNOSIS — D509 Iron deficiency anemia, unspecified: Secondary | ICD-10-CM | POA: Diagnosis not present

## 2023-02-11 DIAGNOSIS — N2581 Secondary hyperparathyroidism of renal origin: Secondary | ICD-10-CM | POA: Diagnosis not present

## 2023-02-11 DIAGNOSIS — E8779 Other fluid overload: Secondary | ICD-10-CM | POA: Diagnosis not present

## 2023-02-11 DIAGNOSIS — N186 End stage renal disease: Secondary | ICD-10-CM | POA: Diagnosis not present

## 2023-02-11 DIAGNOSIS — D631 Anemia in chronic kidney disease: Secondary | ICD-10-CM | POA: Diagnosis not present

## 2023-02-11 DIAGNOSIS — Z992 Dependence on renal dialysis: Secondary | ICD-10-CM | POA: Diagnosis not present

## 2023-02-12 DIAGNOSIS — E119 Type 2 diabetes mellitus without complications: Secondary | ICD-10-CM | POA: Diagnosis not present

## 2023-02-12 DIAGNOSIS — N2581 Secondary hyperparathyroidism of renal origin: Secondary | ICD-10-CM | POA: Diagnosis not present

## 2023-02-12 DIAGNOSIS — Z992 Dependence on renal dialysis: Secondary | ICD-10-CM | POA: Diagnosis not present

## 2023-02-12 DIAGNOSIS — D509 Iron deficiency anemia, unspecified: Secondary | ICD-10-CM | POA: Diagnosis not present

## 2023-02-12 DIAGNOSIS — E8779 Other fluid overload: Secondary | ICD-10-CM | POA: Diagnosis not present

## 2023-02-12 DIAGNOSIS — D631 Anemia in chronic kidney disease: Secondary | ICD-10-CM | POA: Diagnosis not present

## 2023-02-12 DIAGNOSIS — D689 Coagulation defect, unspecified: Secondary | ICD-10-CM | POA: Diagnosis not present

## 2023-02-12 DIAGNOSIS — N186 End stage renal disease: Secondary | ICD-10-CM | POA: Diagnosis not present

## 2023-02-15 DIAGNOSIS — D689 Coagulation defect, unspecified: Secondary | ICD-10-CM | POA: Diagnosis not present

## 2023-02-15 DIAGNOSIS — N186 End stage renal disease: Secondary | ICD-10-CM | POA: Diagnosis not present

## 2023-02-15 DIAGNOSIS — N2581 Secondary hyperparathyroidism of renal origin: Secondary | ICD-10-CM | POA: Diagnosis not present

## 2023-02-15 DIAGNOSIS — E119 Type 2 diabetes mellitus without complications: Secondary | ICD-10-CM | POA: Diagnosis not present

## 2023-02-15 DIAGNOSIS — E8779 Other fluid overload: Secondary | ICD-10-CM | POA: Diagnosis not present

## 2023-02-15 DIAGNOSIS — D631 Anemia in chronic kidney disease: Secondary | ICD-10-CM | POA: Diagnosis not present

## 2023-02-15 DIAGNOSIS — D509 Iron deficiency anemia, unspecified: Secondary | ICD-10-CM | POA: Diagnosis not present

## 2023-02-15 DIAGNOSIS — Z992 Dependence on renal dialysis: Secondary | ICD-10-CM | POA: Diagnosis not present

## 2023-02-17 DIAGNOSIS — Z992 Dependence on renal dialysis: Secondary | ICD-10-CM | POA: Diagnosis not present

## 2023-02-17 DIAGNOSIS — N2581 Secondary hyperparathyroidism of renal origin: Secondary | ICD-10-CM | POA: Diagnosis not present

## 2023-02-17 DIAGNOSIS — E119 Type 2 diabetes mellitus without complications: Secondary | ICD-10-CM | POA: Diagnosis not present

## 2023-02-17 DIAGNOSIS — E8779 Other fluid overload: Secondary | ICD-10-CM | POA: Diagnosis not present

## 2023-02-17 DIAGNOSIS — N186 End stage renal disease: Secondary | ICD-10-CM | POA: Diagnosis not present

## 2023-02-17 DIAGNOSIS — D689 Coagulation defect, unspecified: Secondary | ICD-10-CM | POA: Diagnosis not present

## 2023-02-17 DIAGNOSIS — D509 Iron deficiency anemia, unspecified: Secondary | ICD-10-CM | POA: Diagnosis not present

## 2023-02-17 DIAGNOSIS — D631 Anemia in chronic kidney disease: Secondary | ICD-10-CM | POA: Diagnosis not present

## 2023-02-19 DIAGNOSIS — D689 Coagulation defect, unspecified: Secondary | ICD-10-CM | POA: Diagnosis not present

## 2023-02-19 DIAGNOSIS — E119 Type 2 diabetes mellitus without complications: Secondary | ICD-10-CM | POA: Diagnosis not present

## 2023-02-19 DIAGNOSIS — N2581 Secondary hyperparathyroidism of renal origin: Secondary | ICD-10-CM | POA: Diagnosis not present

## 2023-02-19 DIAGNOSIS — E8779 Other fluid overload: Secondary | ICD-10-CM | POA: Diagnosis not present

## 2023-02-19 DIAGNOSIS — Z992 Dependence on renal dialysis: Secondary | ICD-10-CM | POA: Diagnosis not present

## 2023-02-19 DIAGNOSIS — D509 Iron deficiency anemia, unspecified: Secondary | ICD-10-CM | POA: Diagnosis not present

## 2023-02-19 DIAGNOSIS — D631 Anemia in chronic kidney disease: Secondary | ICD-10-CM | POA: Diagnosis not present

## 2023-02-19 DIAGNOSIS — N186 End stage renal disease: Secondary | ICD-10-CM | POA: Diagnosis not present

## 2023-02-22 DIAGNOSIS — N2581 Secondary hyperparathyroidism of renal origin: Secondary | ICD-10-CM | POA: Diagnosis not present

## 2023-02-22 DIAGNOSIS — D631 Anemia in chronic kidney disease: Secondary | ICD-10-CM | POA: Diagnosis not present

## 2023-02-22 DIAGNOSIS — E8779 Other fluid overload: Secondary | ICD-10-CM | POA: Diagnosis not present

## 2023-02-22 DIAGNOSIS — D509 Iron deficiency anemia, unspecified: Secondary | ICD-10-CM | POA: Diagnosis not present

## 2023-02-22 DIAGNOSIS — E119 Type 2 diabetes mellitus without complications: Secondary | ICD-10-CM | POA: Diagnosis not present

## 2023-02-22 DIAGNOSIS — Z992 Dependence on renal dialysis: Secondary | ICD-10-CM | POA: Diagnosis not present

## 2023-02-22 DIAGNOSIS — D689 Coagulation defect, unspecified: Secondary | ICD-10-CM | POA: Diagnosis not present

## 2023-02-22 DIAGNOSIS — N186 End stage renal disease: Secondary | ICD-10-CM | POA: Diagnosis not present

## 2023-02-23 DIAGNOSIS — N186 End stage renal disease: Secondary | ICD-10-CM | POA: Diagnosis not present

## 2023-02-23 DIAGNOSIS — Z992 Dependence on renal dialysis: Secondary | ICD-10-CM | POA: Diagnosis not present

## 2023-02-23 DIAGNOSIS — I150 Renovascular hypertension: Secondary | ICD-10-CM | POA: Diagnosis not present

## 2023-02-24 DIAGNOSIS — E119 Type 2 diabetes mellitus without complications: Secondary | ICD-10-CM | POA: Diagnosis not present

## 2023-02-24 DIAGNOSIS — E039 Hypothyroidism, unspecified: Secondary | ICD-10-CM | POA: Diagnosis not present

## 2023-02-24 DIAGNOSIS — D509 Iron deficiency anemia, unspecified: Secondary | ICD-10-CM | POA: Diagnosis not present

## 2023-02-24 DIAGNOSIS — N2581 Secondary hyperparathyroidism of renal origin: Secondary | ICD-10-CM | POA: Diagnosis not present

## 2023-02-24 DIAGNOSIS — N186 End stage renal disease: Secondary | ICD-10-CM | POA: Diagnosis not present

## 2023-02-24 DIAGNOSIS — D631 Anemia in chronic kidney disease: Secondary | ICD-10-CM | POA: Diagnosis not present

## 2023-02-24 DIAGNOSIS — Z992 Dependence on renal dialysis: Secondary | ICD-10-CM | POA: Diagnosis not present

## 2023-02-24 DIAGNOSIS — D689 Coagulation defect, unspecified: Secondary | ICD-10-CM | POA: Diagnosis not present

## 2023-02-26 DIAGNOSIS — D689 Coagulation defect, unspecified: Secondary | ICD-10-CM | POA: Diagnosis not present

## 2023-02-26 DIAGNOSIS — N186 End stage renal disease: Secondary | ICD-10-CM | POA: Diagnosis not present

## 2023-02-26 DIAGNOSIS — Z992 Dependence on renal dialysis: Secondary | ICD-10-CM | POA: Diagnosis not present

## 2023-02-26 DIAGNOSIS — D509 Iron deficiency anemia, unspecified: Secondary | ICD-10-CM | POA: Diagnosis not present

## 2023-02-26 DIAGNOSIS — E119 Type 2 diabetes mellitus without complications: Secondary | ICD-10-CM | POA: Diagnosis not present

## 2023-02-26 DIAGNOSIS — N2581 Secondary hyperparathyroidism of renal origin: Secondary | ICD-10-CM | POA: Diagnosis not present

## 2023-02-26 DIAGNOSIS — E039 Hypothyroidism, unspecified: Secondary | ICD-10-CM | POA: Diagnosis not present

## 2023-02-26 DIAGNOSIS — D631 Anemia in chronic kidney disease: Secondary | ICD-10-CM | POA: Diagnosis not present

## 2023-03-01 DIAGNOSIS — Z992 Dependence on renal dialysis: Secondary | ICD-10-CM | POA: Diagnosis not present

## 2023-03-01 DIAGNOSIS — N186 End stage renal disease: Secondary | ICD-10-CM | POA: Diagnosis not present

## 2023-03-01 DIAGNOSIS — N2581 Secondary hyperparathyroidism of renal origin: Secondary | ICD-10-CM | POA: Diagnosis not present

## 2023-03-01 DIAGNOSIS — E119 Type 2 diabetes mellitus without complications: Secondary | ICD-10-CM | POA: Diagnosis not present

## 2023-03-01 DIAGNOSIS — D509 Iron deficiency anemia, unspecified: Secondary | ICD-10-CM | POA: Diagnosis not present

## 2023-03-01 DIAGNOSIS — D631 Anemia in chronic kidney disease: Secondary | ICD-10-CM | POA: Diagnosis not present

## 2023-03-01 DIAGNOSIS — E039 Hypothyroidism, unspecified: Secondary | ICD-10-CM | POA: Diagnosis not present

## 2023-03-01 DIAGNOSIS — D689 Coagulation defect, unspecified: Secondary | ICD-10-CM | POA: Diagnosis not present

## 2023-03-03 DIAGNOSIS — E039 Hypothyroidism, unspecified: Secondary | ICD-10-CM | POA: Diagnosis not present

## 2023-03-03 DIAGNOSIS — D631 Anemia in chronic kidney disease: Secondary | ICD-10-CM | POA: Diagnosis not present

## 2023-03-03 DIAGNOSIS — D689 Coagulation defect, unspecified: Secondary | ICD-10-CM | POA: Diagnosis not present

## 2023-03-03 DIAGNOSIS — D509 Iron deficiency anemia, unspecified: Secondary | ICD-10-CM | POA: Diagnosis not present

## 2023-03-03 DIAGNOSIS — Z992 Dependence on renal dialysis: Secondary | ICD-10-CM | POA: Diagnosis not present

## 2023-03-03 DIAGNOSIS — N2581 Secondary hyperparathyroidism of renal origin: Secondary | ICD-10-CM | POA: Diagnosis not present

## 2023-03-03 DIAGNOSIS — N186 End stage renal disease: Secondary | ICD-10-CM | POA: Diagnosis not present

## 2023-03-03 DIAGNOSIS — E119 Type 2 diabetes mellitus without complications: Secondary | ICD-10-CM | POA: Diagnosis not present

## 2023-03-04 ENCOUNTER — Ambulatory Visit: Payer: Self-pay | Admitting: *Deleted

## 2023-03-04 NOTE — Patient Outreach (Signed)
  Care Coordination   03/04/2023 Name: Kyle Hicks MRN: [id number] DOB: May 17, 1941   Care Coordination Outreach Attempts:  An unsuccessful telephone outreach was attempted for a scheduled appointment today.  Follow Up Plan:  Additional outreach attempts will be made to offer the patient care coordination information and services.   Encounter Outcome:  No Answer. Left HIPAA compliant VM.   Care Coordination Interventions:  No, not indicated    Reviewed endocrinology visit note from 02/09/23. A1C increased to 7.6%. It was 6.8/5 on 09/20/22. Blood pressure while in the office was 80/42.   Staff message sent to scheduling care guide to reach out and schedule another telephone follow-up.  Demetrios Loll, BSN, RN-BC RN Care Coordinator [facility]  [facility] Direct Dial: [phone] Main #: [phone]

## 2023-03-05 DIAGNOSIS — D509 Iron deficiency anemia, unspecified: Secondary | ICD-10-CM | POA: Diagnosis not present

## 2023-03-05 DIAGNOSIS — Z992 Dependence on renal dialysis: Secondary | ICD-10-CM | POA: Diagnosis not present

## 2023-03-05 DIAGNOSIS — E039 Hypothyroidism, unspecified: Secondary | ICD-10-CM | POA: Diagnosis not present

## 2023-03-05 DIAGNOSIS — D631 Anemia in chronic kidney disease: Secondary | ICD-10-CM | POA: Diagnosis not present

## 2023-03-05 DIAGNOSIS — N2581 Secondary hyperparathyroidism of renal origin: Secondary | ICD-10-CM | POA: Diagnosis not present

## 2023-03-05 DIAGNOSIS — D689 Coagulation defect, unspecified: Secondary | ICD-10-CM | POA: Diagnosis not present

## 2023-03-05 DIAGNOSIS — N186 End stage renal disease: Secondary | ICD-10-CM | POA: Diagnosis not present

## 2023-03-05 DIAGNOSIS — E119 Type 2 diabetes mellitus without complications: Secondary | ICD-10-CM | POA: Diagnosis not present

## 2023-03-08 DIAGNOSIS — D509 Iron deficiency anemia, unspecified: Secondary | ICD-10-CM | POA: Diagnosis not present

## 2023-03-08 DIAGNOSIS — E039 Hypothyroidism, unspecified: Secondary | ICD-10-CM | POA: Diagnosis not present

## 2023-03-08 DIAGNOSIS — D631 Anemia in chronic kidney disease: Secondary | ICD-10-CM | POA: Diagnosis not present

## 2023-03-08 DIAGNOSIS — N2581 Secondary hyperparathyroidism of renal origin: Secondary | ICD-10-CM | POA: Diagnosis not present

## 2023-03-08 DIAGNOSIS — N186 End stage renal disease: Secondary | ICD-10-CM | POA: Diagnosis not present

## 2023-03-08 DIAGNOSIS — D689 Coagulation defect, unspecified: Secondary | ICD-10-CM | POA: Diagnosis not present

## 2023-03-08 DIAGNOSIS — Z992 Dependence on renal dialysis: Secondary | ICD-10-CM | POA: Diagnosis not present

## 2023-03-08 DIAGNOSIS — E119 Type 2 diabetes mellitus without complications: Secondary | ICD-10-CM | POA: Diagnosis not present

## 2023-03-10 DIAGNOSIS — E039 Hypothyroidism, unspecified: Secondary | ICD-10-CM | POA: Diagnosis not present

## 2023-03-10 DIAGNOSIS — N2581 Secondary hyperparathyroidism of renal origin: Secondary | ICD-10-CM | POA: Diagnosis not present

## 2023-03-10 DIAGNOSIS — Z992 Dependence on renal dialysis: Secondary | ICD-10-CM | POA: Diagnosis not present

## 2023-03-10 DIAGNOSIS — D509 Iron deficiency anemia, unspecified: Secondary | ICD-10-CM | POA: Diagnosis not present

## 2023-03-10 DIAGNOSIS — D689 Coagulation defect, unspecified: Secondary | ICD-10-CM | POA: Diagnosis not present

## 2023-03-10 DIAGNOSIS — E119 Type 2 diabetes mellitus without complications: Secondary | ICD-10-CM | POA: Diagnosis not present

## 2023-03-10 DIAGNOSIS — N186 End stage renal disease: Secondary | ICD-10-CM | POA: Diagnosis not present

## 2023-03-10 DIAGNOSIS — D631 Anemia in chronic kidney disease: Secondary | ICD-10-CM | POA: Diagnosis not present

## 2023-03-12 DIAGNOSIS — Z992 Dependence on renal dialysis: Secondary | ICD-10-CM | POA: Diagnosis not present

## 2023-03-12 DIAGNOSIS — N2581 Secondary hyperparathyroidism of renal origin: Secondary | ICD-10-CM | POA: Diagnosis not present

## 2023-03-12 DIAGNOSIS — D509 Iron deficiency anemia, unspecified: Secondary | ICD-10-CM | POA: Diagnosis not present

## 2023-03-12 DIAGNOSIS — D631 Anemia in chronic kidney disease: Secondary | ICD-10-CM | POA: Diagnosis not present

## 2023-03-12 DIAGNOSIS — D689 Coagulation defect, unspecified: Secondary | ICD-10-CM | POA: Diagnosis not present

## 2023-03-12 DIAGNOSIS — E119 Type 2 diabetes mellitus without complications: Secondary | ICD-10-CM | POA: Diagnosis not present

## 2023-03-12 DIAGNOSIS — E039 Hypothyroidism, unspecified: Secondary | ICD-10-CM | POA: Diagnosis not present

## 2023-03-12 DIAGNOSIS — N186 End stage renal disease: Secondary | ICD-10-CM | POA: Diagnosis not present

## 2023-03-15 DIAGNOSIS — D509 Iron deficiency anemia, unspecified: Secondary | ICD-10-CM | POA: Diagnosis not present

## 2023-03-15 DIAGNOSIS — E039 Hypothyroidism, unspecified: Secondary | ICD-10-CM | POA: Diagnosis not present

## 2023-03-15 DIAGNOSIS — E119 Type 2 diabetes mellitus without complications: Secondary | ICD-10-CM | POA: Diagnosis not present

## 2023-03-15 DIAGNOSIS — N186 End stage renal disease: Secondary | ICD-10-CM | POA: Diagnosis not present

## 2023-03-15 DIAGNOSIS — N2581 Secondary hyperparathyroidism of renal origin: Secondary | ICD-10-CM | POA: Diagnosis not present

## 2023-03-15 DIAGNOSIS — Z992 Dependence on renal dialysis: Secondary | ICD-10-CM | POA: Diagnosis not present

## 2023-03-15 DIAGNOSIS — D631 Anemia in chronic kidney disease: Secondary | ICD-10-CM | POA: Diagnosis not present

## 2023-03-15 DIAGNOSIS — D689 Coagulation defect, unspecified: Secondary | ICD-10-CM | POA: Diagnosis not present

## 2023-03-16 ENCOUNTER — Telehealth: Payer: Self-pay | Admitting: *Deleted

## 2023-03-16 DIAGNOSIS — E6609 Other obesity due to excess calories: Secondary | ICD-10-CM | POA: Diagnosis not present

## 2023-03-16 DIAGNOSIS — Z1331 Encounter for screening for depression: Secondary | ICD-10-CM | POA: Diagnosis not present

## 2023-03-16 DIAGNOSIS — N186 End stage renal disease: Secondary | ICD-10-CM | POA: Diagnosis not present

## 2023-03-16 DIAGNOSIS — Z6831 Body mass index (BMI) 31.0-31.9, adult: Secondary | ICD-10-CM | POA: Diagnosis not present

## 2023-03-16 DIAGNOSIS — Z0001 Encounter for general adult medical examination with abnormal findings: Secondary | ICD-10-CM | POA: Diagnosis not present

## 2023-03-16 NOTE — Progress Notes (Signed)
  Care Coordination Note  03/16/2023 Name: Kyle Hicks MRN: [id number] DOB: [birth year]  Kyle Hicks is a 82 y.o. year old male who is a primary care patient of Assunta Found, MD and is actively engaged with the care management team. I reached out to Janae Bridgeman by phone today to assist with re-scheduling a follow up visit with the RN Case Manager  Follow up plan: Unsuccessful telephone outreach attempt made. A HIPAA compliant phone message was left for the patient providing contact information and requesting a return call.   [facility]  Care Coordination Care Guide  Direct Dial: [phone]

## 2023-03-17 DIAGNOSIS — E039 Hypothyroidism, unspecified: Secondary | ICD-10-CM | POA: Diagnosis not present

## 2023-03-17 DIAGNOSIS — D689 Coagulation defect, unspecified: Secondary | ICD-10-CM | POA: Diagnosis not present

## 2023-03-17 DIAGNOSIS — D631 Anemia in chronic kidney disease: Secondary | ICD-10-CM | POA: Diagnosis not present

## 2023-03-17 DIAGNOSIS — N2581 Secondary hyperparathyroidism of renal origin: Secondary | ICD-10-CM | POA: Diagnosis not present

## 2023-03-17 DIAGNOSIS — D509 Iron deficiency anemia, unspecified: Secondary | ICD-10-CM | POA: Diagnosis not present

## 2023-03-17 DIAGNOSIS — Z992 Dependence on renal dialysis: Secondary | ICD-10-CM | POA: Diagnosis not present

## 2023-03-17 DIAGNOSIS — N186 End stage renal disease: Secondary | ICD-10-CM | POA: Diagnosis not present

## 2023-03-17 DIAGNOSIS — E119 Type 2 diabetes mellitus without complications: Secondary | ICD-10-CM | POA: Diagnosis not present

## 2023-03-19 DIAGNOSIS — D631 Anemia in chronic kidney disease: Secondary | ICD-10-CM | POA: Diagnosis not present

## 2023-03-19 DIAGNOSIS — E039 Hypothyroidism, unspecified: Secondary | ICD-10-CM | POA: Diagnosis not present

## 2023-03-19 DIAGNOSIS — D509 Iron deficiency anemia, unspecified: Secondary | ICD-10-CM | POA: Diagnosis not present

## 2023-03-19 DIAGNOSIS — Z992 Dependence on renal dialysis: Secondary | ICD-10-CM | POA: Diagnosis not present

## 2023-03-19 DIAGNOSIS — N2581 Secondary hyperparathyroidism of renal origin: Secondary | ICD-10-CM | POA: Diagnosis not present

## 2023-03-19 DIAGNOSIS — N186 End stage renal disease: Secondary | ICD-10-CM | POA: Diagnosis not present

## 2023-03-19 DIAGNOSIS — E119 Type 2 diabetes mellitus without complications: Secondary | ICD-10-CM | POA: Diagnosis not present

## 2023-03-19 DIAGNOSIS — D689 Coagulation defect, unspecified: Secondary | ICD-10-CM | POA: Diagnosis not present

## 2023-03-22 DIAGNOSIS — E119 Type 2 diabetes mellitus without complications: Secondary | ICD-10-CM | POA: Diagnosis not present

## 2023-03-22 DIAGNOSIS — N186 End stage renal disease: Secondary | ICD-10-CM | POA: Diagnosis not present

## 2023-03-22 DIAGNOSIS — Z992 Dependence on renal dialysis: Secondary | ICD-10-CM | POA: Diagnosis not present

## 2023-03-22 DIAGNOSIS — D509 Iron deficiency anemia, unspecified: Secondary | ICD-10-CM | POA: Diagnosis not present

## 2023-03-22 DIAGNOSIS — E039 Hypothyroidism, unspecified: Secondary | ICD-10-CM | POA: Diagnosis not present

## 2023-03-22 DIAGNOSIS — D689 Coagulation defect, unspecified: Secondary | ICD-10-CM | POA: Diagnosis not present

## 2023-03-22 DIAGNOSIS — D631 Anemia in chronic kidney disease: Secondary | ICD-10-CM | POA: Diagnosis not present

## 2023-03-22 DIAGNOSIS — N2581 Secondary hyperparathyroidism of renal origin: Secondary | ICD-10-CM | POA: Diagnosis not present

## 2023-03-23 NOTE — Progress Notes (Signed)
  Care Coordination Note  03/23/2023 Name: Kyle Hicks MRN: [id number] DOB: [birth year]  Kyle Hicks is a 82 y.o. year old male who is a primary care patient of Assunta Found, MD and is actively engaged with the care management team. I reached out to Janae Bridgeman by phone today to assist with re-scheduling a follow up visit with the RN Case Manager  Follow up plan: Unsuccessful telephone outreach attempt made. A HIPAA compliant phone message was left for the patient providing contact information and requesting a return call.  We have been unable to make contact with the patient for follow up. The care management team is available to follow up with the patient after provider conversation with the patient regarding recommendation for care management engagement and subsequent re-referral to the care management team.   [facility] Coordination Care Guide  Direct Dial: [phone]

## 2023-03-24 DIAGNOSIS — E119 Type 2 diabetes mellitus without complications: Secondary | ICD-10-CM | POA: Diagnosis not present

## 2023-03-24 DIAGNOSIS — D689 Coagulation defect, unspecified: Secondary | ICD-10-CM | POA: Diagnosis not present

## 2023-03-24 DIAGNOSIS — Z992 Dependence on renal dialysis: Secondary | ICD-10-CM | POA: Diagnosis not present

## 2023-03-24 DIAGNOSIS — N186 End stage renal disease: Secondary | ICD-10-CM | POA: Diagnosis not present

## 2023-03-24 DIAGNOSIS — D631 Anemia in chronic kidney disease: Secondary | ICD-10-CM | POA: Diagnosis not present

## 2023-03-24 DIAGNOSIS — N2581 Secondary hyperparathyroidism of renal origin: Secondary | ICD-10-CM | POA: Diagnosis not present

## 2023-03-24 DIAGNOSIS — D509 Iron deficiency anemia, unspecified: Secondary | ICD-10-CM | POA: Diagnosis not present

## 2023-03-24 DIAGNOSIS — E039 Hypothyroidism, unspecified: Secondary | ICD-10-CM | POA: Diagnosis not present

## 2023-03-26 DIAGNOSIS — D509 Iron deficiency anemia, unspecified: Secondary | ICD-10-CM | POA: Diagnosis not present

## 2023-03-26 DIAGNOSIS — D689 Coagulation defect, unspecified: Secondary | ICD-10-CM | POA: Diagnosis not present

## 2023-03-26 DIAGNOSIS — N186 End stage renal disease: Secondary | ICD-10-CM | POA: Diagnosis not present

## 2023-03-26 DIAGNOSIS — I150 Renovascular hypertension: Secondary | ICD-10-CM | POA: Diagnosis not present

## 2023-03-26 DIAGNOSIS — Z992 Dependence on renal dialysis: Secondary | ICD-10-CM | POA: Diagnosis not present

## 2023-03-26 DIAGNOSIS — D631 Anemia in chronic kidney disease: Secondary | ICD-10-CM | POA: Diagnosis not present

## 2023-03-26 DIAGNOSIS — E039 Hypothyroidism, unspecified: Secondary | ICD-10-CM | POA: Diagnosis not present

## 2023-03-26 DIAGNOSIS — E119 Type 2 diabetes mellitus without complications: Secondary | ICD-10-CM | POA: Diagnosis not present

## 2023-03-26 DIAGNOSIS — N2581 Secondary hyperparathyroidism of renal origin: Secondary | ICD-10-CM | POA: Diagnosis not present

## 2023-03-29 DIAGNOSIS — Z992 Dependence on renal dialysis: Secondary | ICD-10-CM | POA: Diagnosis not present

## 2023-03-29 DIAGNOSIS — D509 Iron deficiency anemia, unspecified: Secondary | ICD-10-CM | POA: Diagnosis not present

## 2023-03-29 DIAGNOSIS — N186 End stage renal disease: Secondary | ICD-10-CM | POA: Diagnosis not present

## 2023-03-29 DIAGNOSIS — D689 Coagulation defect, unspecified: Secondary | ICD-10-CM | POA: Diagnosis not present

## 2023-03-29 DIAGNOSIS — N2581 Secondary hyperparathyroidism of renal origin: Secondary | ICD-10-CM | POA: Diagnosis not present

## 2023-03-31 DIAGNOSIS — D689 Coagulation defect, unspecified: Secondary | ICD-10-CM | POA: Diagnosis not present

## 2023-03-31 DIAGNOSIS — Z992 Dependence on renal dialysis: Secondary | ICD-10-CM | POA: Diagnosis not present

## 2023-03-31 DIAGNOSIS — N2581 Secondary hyperparathyroidism of renal origin: Secondary | ICD-10-CM | POA: Diagnosis not present

## 2023-03-31 DIAGNOSIS — D509 Iron deficiency anemia, unspecified: Secondary | ICD-10-CM | POA: Diagnosis not present

## 2023-03-31 DIAGNOSIS — N186 End stage renal disease: Secondary | ICD-10-CM | POA: Diagnosis not present

## 2023-04-02 DIAGNOSIS — N186 End stage renal disease: Secondary | ICD-10-CM | POA: Diagnosis not present

## 2023-04-02 DIAGNOSIS — N2581 Secondary hyperparathyroidism of renal origin: Secondary | ICD-10-CM | POA: Diagnosis not present

## 2023-04-02 DIAGNOSIS — Z992 Dependence on renal dialysis: Secondary | ICD-10-CM | POA: Diagnosis not present

## 2023-04-02 DIAGNOSIS — D689 Coagulation defect, unspecified: Secondary | ICD-10-CM | POA: Diagnosis not present

## 2023-04-02 DIAGNOSIS — D509 Iron deficiency anemia, unspecified: Secondary | ICD-10-CM | POA: Diagnosis not present

## 2023-04-08 DIAGNOSIS — T82858A Stenosis of vascular prosthetic devices, implants and grafts, initial encounter: Secondary | ICD-10-CM | POA: Diagnosis not present

## 2023-04-08 DIAGNOSIS — Z992 Dependence on renal dialysis: Secondary | ICD-10-CM | POA: Diagnosis not present

## 2023-04-08 DIAGNOSIS — N186 End stage renal disease: Secondary | ICD-10-CM | POA: Diagnosis not present

## 2023-04-08 DIAGNOSIS — I871 Compression of vein: Secondary | ICD-10-CM | POA: Diagnosis not present

## 2023-04-19 ENCOUNTER — Other Ambulatory Visit (HOSPITAL_COMMUNITY): Payer: Self-pay | Admitting: Family Medicine

## 2023-04-25 DIAGNOSIS — I150 Renovascular hypertension: Secondary | ICD-10-CM | POA: Diagnosis not present

## 2023-04-25 DIAGNOSIS — N186 End stage renal disease: Secondary | ICD-10-CM | POA: Diagnosis not present

## 2023-04-25 DIAGNOSIS — Z992 Dependence on renal dialysis: Secondary | ICD-10-CM | POA: Diagnosis not present

## 2023-05-13 DIAGNOSIS — Z23 Encounter for immunization: Secondary | ICD-10-CM | POA: Diagnosis not present

## 2023-05-26 DIAGNOSIS — N186 End stage renal disease: Secondary | ICD-10-CM | POA: Diagnosis not present

## 2023-05-26 DIAGNOSIS — Z992 Dependence on renal dialysis: Secondary | ICD-10-CM | POA: Diagnosis not present

## 2023-05-26 DIAGNOSIS — I150 Renovascular hypertension: Secondary | ICD-10-CM | POA: Diagnosis not present

## 2023-06-25 DIAGNOSIS — Z992 Dependence on renal dialysis: Secondary | ICD-10-CM | POA: Diagnosis not present

## 2023-06-25 DIAGNOSIS — I150 Renovascular hypertension: Secondary | ICD-10-CM | POA: Diagnosis not present

## 2023-06-25 DIAGNOSIS — N186 End stage renal disease: Secondary | ICD-10-CM | POA: Diagnosis not present

## 2023-06-29 ENCOUNTER — Ambulatory Visit (INDEPENDENT_AMBULATORY_CARE_PROVIDER_SITE_OTHER): Payer: Medicare HMO | Admitting: "Endocrinology

## 2023-06-29 ENCOUNTER — Encounter: Payer: Self-pay | Admitting: "Endocrinology

## 2023-06-29 VITALS — BP 102/50 | HR 60 | Ht 66.5 in | Wt 198.2 lb

## 2023-06-29 DIAGNOSIS — E782 Mixed hyperlipidemia: Secondary | ICD-10-CM | POA: Diagnosis not present

## 2023-06-29 DIAGNOSIS — N186 End stage renal disease: Secondary | ICD-10-CM | POA: Diagnosis not present

## 2023-06-29 DIAGNOSIS — Z7984 Long term (current) use of oral hypoglycemic drugs: Secondary | ICD-10-CM | POA: Diagnosis not present

## 2023-06-29 DIAGNOSIS — E669 Obesity, unspecified: Secondary | ICD-10-CM

## 2023-06-29 DIAGNOSIS — E1122 Type 2 diabetes mellitus with diabetic chronic kidney disease: Secondary | ICD-10-CM

## 2023-06-29 DIAGNOSIS — I10 Essential (primary) hypertension: Secondary | ICD-10-CM | POA: Diagnosis not present

## 2023-06-29 NOTE — Progress Notes (Signed)
06/29/2023  Endocrinology follow-up note   Subjective:    Patient ID: Kyle Hicks, male    DOB: 15-Aug-1940, PCP Assunta Found, MD   Past Medical History:  Diagnosis Date   Chronic kidney disease    Diabetes mellitus without complication [facility])    Dialysis patient [facility])    Diastolic CHF, acute (HCC) 10/08/2020   HTN (hypertension)    Past Surgical History:  Procedure Laterality Date   AV FISTULA PLACEMENT Left 01/22/2021   Procedure: LEFT UPPER ARM ARTERIOVENOUS GORE-TEX GRAFT;  Surgeon: Larina Earthly, MD;  Location: [facility] ORS;  Service: Vascular;  Laterality: Left;   CARDIOVERSION N/A 10/10/2020   Procedure: CARDIOVERSION;  Surgeon: Lewayne Bunting, MD;  Location: [facility] ENDOSCOPY;  Service: Cardiovascular;  Laterality: N/A;   ESOPHAGOGASTRODUODENOSCOPY N/A 04/27/2017   Procedure: ESOPHAGOGASTRODUODENOSCOPY (EGD);  Surgeon: Malissa Hippo, MD;  Location: [facility] ENDO SUITE;  Service: Endoscopy;  Laterality: N/A;   IR FLUORO GUIDE CV LINE RIGHT  10/23/2020   IR US GUIDANCE  10/23/2020   RIGHT HEART CATH N/A 10/16/2020   Procedure: RIGHT HEART CATH;  Surgeon: Dolores Patty, MD;  Location: [facility] INVASIVE CV LAB;  Service: Cardiovascular;  Laterality: N/A;   RIGHT HEART CATH AND CORONARY ANGIOGRAPHY N/A 10/24/2020   Procedure: RIGHT HEART CATH AND CORONARY ANGIOGRAPHY;  Surgeon: Dolores Patty, MD;  Location: [facility] INVASIVE CV LAB;  Service: Cardiovascular;  Laterality: N/A;   TEE WITHOUT CARDIOVERSION N/A 10/10/2020   Procedure: TRANSESOPHAGEAL ECHOCARDIOGRAM (TEE);  Surgeon: Lewayne Bunting, MD;  Location: [facility] ENDOSCOPY;  Service: Cardiovascular;  Laterality: N/A;   TEMPORARY DIALYSIS CATHETER  10/16/2020   Procedure: TEMPORARY DIALYSIS CATHETER;  Surgeon: Dolores Patty, MD;  Location: [facility] INVASIVE CV LAB;  Service: Cardiovascular;;   Social History   Socioeconomic History   Marital status: Married    Spouse name: Not on file   Number of children: Not on file   Years of education:  Not on file   Highest education level: Not on file  Occupational History   Not on file  Tobacco Use   Smoking status: Former    Current packs/day: 0.00    Types: Cigarettes    Quit date: 07/26/1987    Years since quitting: 35.9   Smokeless tobacco: Never  Vaping Use   Vaping status: Never Used  Substance and Sexual Activity   Alcohol use: No    Alcohol/week: 0.0 standard drinks of alcohol    Comment: Previously drank socially, last drank 25-30 years ago   Drug use: No   Sexual activity: Not on file  Other Topics Concern   Not on file  Social History Narrative   Not on file   Social Determinants of Health   Financial Resource Strain: Low Risk  (01/17/2023)   Overall Financial Resource Strain (CARDIA)    Difficulty of Paying Living Expenses: Not very hard  Food Insecurity: No Food Insecurity (01/17/2023)   Hunger Vital Sign    Worried About Running Out of Food in the Last Year: Never true    Ran Out of Food in the Last Year: Never true  Transportation Needs: No Transportation Needs (01/31/2023)   PRAPARE - Administrator, Civil Service (Medical): No    Lack of Transportation (Non-Medical): No  Physical Activity: Inactive (01/31/2023)   Exercise Vital Sign    Days of Exercise per Week: 0 days    Minutes of Exercise per Session: 0 min  Stress: Not on file  Social  Connections: Not on file   Outpatient Encounter Medications as of 06/29/2023  Medication Sig   amiodarone (PACERONE) 100 MG tablet Take 1 tablet (100 mg total) by mouth daily.   apixaban (ELIQUIS) 5 MG TABS tablet Take 1 tablet (5 mg total) by mouth 2 (two) times daily.   atorvastatin (LIPITOR) 20 MG tablet Take 1 tablet (20 mg total) by mouth daily.   blood glucose meter kit and supplies KIT Dispense based on patient and insurance preference. Use up to 2 times daily as directed. (FOR ICD-10 E11.65)   Blood Glucose Monitoring Suppl (ONETOUCH VERIO FLEX SYSTEM) w/Device KIT Use to test blood glucose twice  daily. Dx Code: E11.65   glipiZIDE (GLUCOTROL) 5 MG tablet Take 1 tablet by mouth once daily with breakfast   glucose blood (ONETOUCH VERIO) test strip Use as instructed to check blood glucose twice daily. Dx Code: E11.65   hydrocortisone cream 1 % Apply 1 application topically 3 (three) times daily as needed for itching (minor skin irritation).   [name] CARBONATE PO 500 mg. Patient is taking 500 mg tablet (chewable)3 times daily.   linagliptin (TRADJENTA) 5 MG TABS tablet Take 1 tablet (5 mg total) by mouth daily.   midodrine (PROAMATINE) 10 MG tablet Take 1 tablet (10 mg total) by mouth 3 (three) times a week.   OneTouch Delica Lancets 33G MISC Use to check blood glucose twice daily. Dx Code: E11.65   oxyCODONE-acetaminophen (PERCOCET) 5-325 MG tablet Take 1 tablet by mouth every 6 (six) hours as needed for severe pain. (Patient not taking: Reported on 08/14/2021)   Polyethyl Glycol-Propyl Glycol (SYSTANE) 0.4-0.3 % SOLN Place 1 drop into both eyes daily as needed (Eye irritation).   torsemide (DEMADEX) 100 MG tablet Take 1 tablet (100 mg total) by mouth 3 (three) times a week. Monday, Wednesday and Fridays. NEEDS FOLLOW UP APPOINTMENT FOR ANYMORE REFILLS   No facility-administered encounter medications on file as of 06/29/2023.   ALLERGIES: No Known Allergies VACCINATION STATUS: Immunization History  Administered Date(s) Administered   Moderna Sars-Covid-2 Vaccination 09/23/2019, 10/27/2019    Diabetes He presents for his follow-up diabetic visit. He has type 2 diabetes mellitus. The initial diagnosis of diabetes was made 2 years ago. His disease course has been stable. There are no hypoglycemic associated symptoms. Pertinent negatives for hypoglycemia include no confusion, headaches, pallor or seizures. There are no diabetic associated symptoms. Pertinent negatives for diabetes include no chest pain, no fatigue, no polydipsia, no polyphagia, no polyuria and no weakness. There are no  hypoglycemic complications. Symptoms are improving. Diabetic complications include nephropathy. Risk factors for coronary artery disease include diabetes mellitus, dyslipidemia, hypertension, male sex, obesity, sedentary lifestyle and tobacco exposure. He is compliant with treatment most of the time. His weight is fluctuating minimally (He lost approximately 60 pounds since last visit.). He is following a generally unhealthy diet. He has had a previous visit with a dietitian. He never participates in exercise. His home blood glucose trend is fluctuating minimally. (He did not bring any logs nor meter with him.  His previsit A1c was reported to be 7.3%. he denies hypoglycemia.      ) An ACE inhibitor/angiotensin II receptor blocker is being taken.  Hypertension This is a chronic problem. The current episode started more than 1 year ago. The problem is uncontrolled. Pertinent negatives include no chest pain, headaches, neck pain, palpitations or shortness of breath. Risk factors for coronary artery disease include diabetes mellitus, dyslipidemia, obesity, sedentary lifestyle and smoking/tobacco exposure. Past  treatments include angiotensin blockers. Identifiable causes of hypertension include chronic renal disease.  Hyperlipidemia This is a chronic problem. The current episode started more than 1 year ago. Exacerbating diseases include chronic renal disease, diabetes and obesity. Pertinent negatives include no chest pain, myalgias or shortness of breath. Current antihyperlipidemic treatment includes statins. Risk factors for coronary artery disease include diabetes mellitus, dyslipidemia, hypertension, family history, male sex, obesity and a sedentary lifestyle.    Review of Systems  Constitutional:  Negative for fatigue and unexpected weight change.  HENT:  Negative for dental problem, mouth sores and trouble swallowing.   Eyes:  Negative for visual disturbance.  Respiratory:  Negative for cough,  choking, chest tightness, shortness of breath and wheezing.   Cardiovascular:  Negative for chest pain, palpitations and leg swelling.  Gastrointestinal:  Negative for abdominal distention, abdominal pain, constipation, diarrhea, nausea and vomiting.  Endocrine: Negative for polydipsia, polyphagia and polyuria.  Genitourinary:  Negative for dysuria, flank pain, hematuria and urgency.  Musculoskeletal:  Negative for back pain, gait problem, myalgias and neck pain.  Skin:  Negative for pallor, rash and wound.  Neurological:  Negative for seizures, syncope, weakness, numbness and headaches.  Psychiatric/Behavioral: Negative.  Negative for confusion and dysphoric mood.     Objective:    BP (!) 102/50   Pulse 60   Ht 5' 6.5" (1.689 m)   Wt 198 lb 3.2 oz (89.9 kg)   BMI 31.51 kg/m   Wt Readings from Last 3 Encounters:  06/29/23 198 lb 3.2 oz (89.9 kg)  02/09/23 201 lb (91.2 kg)  09/20/22 200 lb 9.6 oz (91 kg)      Results for orders placed or performed in visit on 02/09/23  HgB A1c  Result Value Ref Range   Hemoglobin A1C     HbA1c POC (<> result, manual entry)     HbA1c, POC (prediabetic range)     HbA1c, POC (controlled diabetic range) 7.6 (A) 0.0 - 7.0 %   Diabetic Labs (most recent): Lab Results  Component Value Date   HGBA1C 7.6 (A) 02/09/2023   HGBA1C 6.8 09/20/2022   HGBA1C 6.1 02/12/2022   MICROALBUR 142.0 12/04/2019   MICROALBUR 1.2 06/20/2017   Lipid Panel     Component Value Date/Time   CHOL 142 09/13/2022 0817   TRIG 64 09/13/2022 0817   HDL 64 09/13/2022 0817   CHOLHDL 2.2 09/13/2022 0817   CHOLHDL 3.1 04/08/2020 0724   VLDL 14 01/19/2016 0719   LDLCALC 65 09/13/2022 0817   LDLCALC 85 04/08/2020 0724     Assessment & Plan:   1. 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     - He remains at a high risk for more acute and chronic complications of diabetes which include CAD, CVA, retinopathy, and neuropathy. These are all  discussed in detail with the patient.  He did not bring any logs nor meter with him.  His previsit A1c was reported to be 7.3%. he denies hypoglycemia.    - I have re-counseled the patient on diet management and weight loss  by adopting a carbohydrate restricted / protein rich  Diet.  - he acknowledges that there is a room for improvement in his food and drink choices. - Suggestion is made for him to avoid simple carbohydrates  from his diet including Cakes, Sweet Desserts, Ice Cream, Soda (diet and regular), Sweet Tea, Candies, Chips, Cookies, Store Bought Juices, Alcohol in Excess of  1-2 drinks a day, Artificial Sweeteners,  Coffee Creamer, and "Sugar-free" Products, Lemonade. This will help patient to have more stable blood glucose profile and potentially avoid unintended weight gain.  - Patient is advised to stick to a routine mealtimes to eat 3 meals  a day and avoid unnecessary snacks ( to snack only to correct hypoglycemia).  - I have approached patient with the following individualized plan to manage diabetes and patient agrees.   -Emphasizing the need for monitoring blood glucose at least once a day before breakfast, she is advised to continue glipizide 5 mg XL and Tradjenta 5 mg.  P.o. daily at breakfast.   -He is encouraged to call clinic for blood glucose readings below 70 or greater than 200 mg/dl at fasting. - Patient specific target  for A1c; LDL, HDL, Triglycerides,  were discussed in detail.  2) BP/HTN:  His blood pressure is controlled to target.  Patient is asymptomatic.  He was taken off of Coreg last visit.  Blood pressure management deferred to nephrology.  He is also  torsemide 100 mg p.o. daily.  He is on midodrine for orthostatic hypotension.   3) Lipids/HPL: His recent lipid panel showed controlled LDL at 65.  He is advised to continue atorvastatin 20 nightly.     Side effects and precautions discussed with him.    4)  Weight/Diet: His BMI is 31.51---  He has lost  weight related to initiation of hemodialysis.   5) Chronic Care/Health Maintenance:  -Patient is on ACEI/ARB and Statin medications and encouraged to continue to follow up with Ophthalmology, nephrology, cardiology, podiatrist at least yearly or according to recommendations, and advised to stay away from smoking. I have recommended yearly flu vaccine and pneumonia vaccination at least every 5 years; moderate intensity exercise for up to 150 minutes weekly; and  sleep for at least 7 hours a day.  He is currently on amiodarone related to his recent diagnosis of tachyarrhythmia-atrial flutter/atrial fibrillation.  He will need thyroid function test at least twice a year.  His last thyroid function test from August 07, 2021 showed TSH 1.16, free T4 1.75, this is consistent with euthyroid presentation.     - I advised patient to maintain close follow up with Assunta Found, MD for primary care needs.  I spent  26  minutes in the care of the patient today including review of labs from CMP, Lipids, Thyroid Function, Hematology (current and previous including abstractions from other facilities); face-to-face time discussing  his blood glucose readings/logs, discussing hypoglycemia and hyperglycemia episodes and symptoms, medications doses, his options of short and long term treatment based on the latest standards of care / guidelines;  discussion about incorporating lifestyle medicine;  and documenting the encounter. Risk reduction counseling performed per USPSTF guidelines to reduce  obesity and cardiovascular risk factors.     Please refer to Patient Instructions for Blood Glucose Monitoring and Insulin/Medications Dosing Guide"  in media tab for additional information. Please  also refer to " Patient Self Inventory" in the Media  tab for reviewed elements of pertinent patient history.  Kyle Hicks participated in the discussions, expressed understanding, and voiced agreement with the above plans.  All  questions were answered to his satisfaction. he is encouraged to contact clinic should he have any questions or concerns prior to his return visit.    Follow up plan: -Return in about 6 months (around 12/28/2023) for Bring Meter/CGM Device/Logs- A1c in Office.  Marquis Lunch, MD Phone: [phone]  Fax: [phone]  -  This note  was partially dictated with voice recognition software. Similar sounding words can be transcribed inadequately or may not  be corrected upon review.  06/29/2023, 11:12 AM

## 2023-06-29 NOTE — Patient Instructions (Signed)
                                     Advice for Weight Management  -For most of us the best way to lose weight is by diet management. Generally speaking, diet management means consuming less calories intentionally which over time brings about progressive weight loss.  This can be achieved more effectively by avoiding ultra processed carbohydrates, processed meats, unhealthy fats.    It is critically important to know your numbers: how much calorie you are consuming and how much calorie you need. More importantly, our carbohydrates sources should be unprocessed naturally occurring  complex starch food items.  It is always important to balance nutrition also by  appropriate intake of proteins (mainly plant-based), healthy fats/oils, plenty of fruits and vegetables.   -The American College of Lifestyle Medicine (ACL M) recommends nutrition derived mostly from Whole Food, Plant Predominant Sources example an apple instead of applesauce or apple pie. Eat Plenty of vegetables, Mushrooms, fruits, Legumes, Whole Grains, Nuts, seeds in lieu of processed meats, processed snacks/pastries red meat, poultry, eggs.  Use only water or unsweetened tea for hydration.  The College also recommends the need to stay away from risky substances including alcohol, smoking; obtaining 7-9 hours of restorative sleep, at least 150 minutes of moderate intensity exercise weekly, importance of healthy social connections, and being mindful of stress and seek help when it is overwhelming.    -Sticking to a routine mealtime to eat 3 meals a day and avoiding unnecessary snacks is shown to have a big role in weight control. Under normal circumstances, the only time we burn stored energy is when we are hungry, so allow  some hunger to take place- hunger means no food between appropriate meal times, only water.  It is not advisable to starve.   -It is better to avoid simple carbohydrates including:  Cakes, Sweet Desserts, Ice Cream, Soda (diet and regular), Sweet Tea, Candies, Chips, Cookies, Store Bought Juices, Alcohol in Excess of  1-2 drinks a day, Lemonade,  Artificial Sweeteners, Doughnuts, Coffee Creamers, "Sugar-free" Products, etc, etc.  This is not a complete list.....    -Consulting with certified diabetes educators is proven to provide you with the most accurate and current information on diet.  Also, you may be  interested in discussing diet options/exchanges , we can schedule a visit with Kyle Hicks, RDN, CDE for individualized nutrition education.  -Exercise: If you are able: 30 -60 minutes a day ,4 days a week, or 150 minutes of moderate intensity exercise weekly.    The longer the better if tolerated.  Combine stretch, strength, and aerobic activities.  If you were told in the past that you have high risk for cardiovascular diseases, or if you are currently symptomatic, you may seek evaluation by your heart doctor prior to initiating moderate to intense exercise programs.                                  Additional Care Considerations for Diabetes/Prediabetes   -Diabetes  is a chronic disease.  The most important care consideration is regular follow-up with your diabetes care provider with the goal being avoiding or delaying its complications and to take advantage of advances in medications and technology.  If appropriate actions are taken early enough, type 2 diabetes can even be   reversed.  Seek information from the right source.  - Whole Food, Plant Predominant Nutrition is highly recommended: Eat Plenty of vegetables, Mushrooms, fruits, Legumes, Whole Grains, Nuts, seeds in lieu of processed meats, processed snacks/pastries red meat, poultry, eggs as recommended by American College of  Lifestyle Medicine (ACLM).  -Type 2 diabetes is known to coexist with other important comorbidities such as high blood pressure and high cholesterol.  It is critical to control not only the  diabetes but also the high blood pressure and high cholesterol to minimize and delay the risk of complications including coronary artery disease, stroke, amputations, blindness, etc.  The good news is that this diet recommendation for type 2 diabetes is also very helpful for managing high cholesterol and high blood blood pressure.  - Studies showed that people with diabetes will benefit from a class of medications known as ACE inhibitors and statins.  Unless there are specific reasons not to be on these medications, the standard of care is to consider getting one from these groups of medications at an optimal doses.  These medications are generally considered safe and proven to help protect the heart and the kidneys.    - People with diabetes are encouraged to initiate and maintain regular follow-up with eye doctors, foot doctors, dentists , and if necessary heart and kidney doctors.     - It is highly recommended that people with diabetes quit smoking or stay away from smoking, and get yearly  flu vaccine and pneumonia vaccine at least every 5 years.  See above for additional recommendations on exercise, sleep, stress management , and healthy social connections.      

## 2023-07-08 ENCOUNTER — Other Ambulatory Visit (HOSPITAL_COMMUNITY): Payer: Self-pay | Admitting: Family Medicine

## 2023-07-24 ENCOUNTER — Other Ambulatory Visit (HOSPITAL_COMMUNITY): Payer: Self-pay | Admitting: Family Medicine

## 2023-07-26 DIAGNOSIS — N186 End stage renal disease: Secondary | ICD-10-CM | POA: Diagnosis not present

## 2023-07-26 DIAGNOSIS — Z992 Dependence on renal dialysis: Secondary | ICD-10-CM | POA: Diagnosis not present

## 2023-07-26 DIAGNOSIS — I150 Renovascular hypertension: Secondary | ICD-10-CM | POA: Diagnosis not present

## 2023-08-06 ENCOUNTER — Other Ambulatory Visit: Payer: Self-pay | Admitting: "Endocrinology

## 2023-08-06 ENCOUNTER — Other Ambulatory Visit (HOSPITAL_COMMUNITY): Payer: Self-pay | Admitting: Family Medicine

## 2023-08-15 ENCOUNTER — Other Ambulatory Visit (HOSPITAL_COMMUNITY): Payer: Self-pay | Admitting: Family Medicine

## 2023-08-26 DIAGNOSIS — I150 Renovascular hypertension: Secondary | ICD-10-CM | POA: Diagnosis not present

## 2023-08-26 DIAGNOSIS — N186 End stage renal disease: Secondary | ICD-10-CM | POA: Diagnosis not present

## 2023-08-26 DIAGNOSIS — Z992 Dependence on renal dialysis: Secondary | ICD-10-CM | POA: Diagnosis not present

## 2023-09-21 ENCOUNTER — Other Ambulatory Visit (HOSPITAL_COMMUNITY): Payer: Self-pay | Admitting: Family Medicine

## 2023-09-23 DIAGNOSIS — Z992 Dependence on renal dialysis: Secondary | ICD-10-CM | POA: Diagnosis not present

## 2023-09-23 DIAGNOSIS — N186 End stage renal disease: Secondary | ICD-10-CM | POA: Diagnosis not present

## 2023-09-23 DIAGNOSIS — I150 Renovascular hypertension: Secondary | ICD-10-CM | POA: Diagnosis not present

## 2023-09-26 ENCOUNTER — Encounter (HOSPITAL_COMMUNITY)
Admission: RE | Disposition: A | Discharge status: Home / Self Care | Payer: Self-pay | Source: Home / Self Care | Attending: Nephrology

## 2023-09-26 ENCOUNTER — Other Ambulatory Visit: Payer: Self-pay

## 2023-09-26 ENCOUNTER — Ambulatory Visit (HOSPITAL_COMMUNITY)
Admission: RE | Admit: 2023-09-26 | Discharge: 2023-09-26 | Disposition: A | Discharge status: Home / Self Care | Payer: Medicare HMO | Attending: Nephrology | Admitting: Nephrology

## 2023-09-26 ENCOUNTER — Encounter (HOSPITAL_COMMUNITY): Payer: Self-pay | Admitting: Nephrology

## 2023-09-26 DIAGNOSIS — Y832 Surgical operation with anastomosis, bypass or graft as the cause of abnormal reaction of the patient, or of later complication, without mention of misadventure at the time of the procedure: Secondary | ICD-10-CM | POA: Insufficient documentation

## 2023-09-26 DIAGNOSIS — N250 Renal osteodystrophy: Secondary | ICD-10-CM | POA: Diagnosis not present

## 2023-09-26 DIAGNOSIS — Z992 Dependence on renal dialysis: Secondary | ICD-10-CM | POA: Diagnosis not present

## 2023-09-26 DIAGNOSIS — E1122 Type 2 diabetes mellitus with diabetic chronic kidney disease: Secondary | ICD-10-CM | POA: Insufficient documentation

## 2023-09-26 DIAGNOSIS — N186 End stage renal disease: Secondary | ICD-10-CM | POA: Insufficient documentation

## 2023-09-26 DIAGNOSIS — I132 Hypertensive heart and chronic kidney disease with heart failure and with stage 5 chronic kidney disease, or end stage renal disease: Secondary | ICD-10-CM | POA: Insufficient documentation

## 2023-09-26 DIAGNOSIS — I871 Compression of vein: Secondary | ICD-10-CM | POA: Diagnosis not present

## 2023-09-26 DIAGNOSIS — D631 Anemia in chronic kidney disease: Secondary | ICD-10-CM | POA: Insufficient documentation

## 2023-09-26 DIAGNOSIS — I5032 Chronic diastolic (congestive) heart failure: Secondary | ICD-10-CM | POA: Insufficient documentation

## 2023-09-26 DIAGNOSIS — Z79899 Other long term (current) drug therapy: Secondary | ICD-10-CM | POA: Diagnosis not present

## 2023-09-26 DIAGNOSIS — Z87891 Personal history of nicotine dependence: Secondary | ICD-10-CM | POA: Insufficient documentation

## 2023-09-26 DIAGNOSIS — T82858A Stenosis of vascular prosthetic devices, implants and grafts, initial encounter: Secondary | ICD-10-CM | POA: Diagnosis not present

## 2023-09-26 DIAGNOSIS — Z7984 Long term (current) use of oral hypoglycemic drugs: Secondary | ICD-10-CM | POA: Diagnosis not present

## 2023-09-26 HISTORY — PX: A/V FISTULAGRAM: CATH118298

## 2023-09-26 HISTORY — PX: PERIPHERAL VASCULAR BALLOON ANGIOPLASTY: CATH118281

## 2023-09-26 SURGERY — A/V FISTULAGRAM
Anesthesia: LOCAL

## 2023-09-26 MED ORDER — FENTANYL CITRATE (PF) 100 MCG/2ML IJ SOLN
INTRAMUSCULAR | Status: AC
  Filled 2023-09-26: qty 2

## 2023-09-26 MED ORDER — HEPARIN (PORCINE) IN NACL 1000-0.9 UT/500ML-% IV SOLN
INTRAVENOUS | Status: DC | PRN
  Administered 2023-09-26: 500 mL

## 2023-09-26 MED ORDER — MIDAZOLAM HCL 2 MG/2ML IJ SOLN
INTRAMUSCULAR | Status: DC | PRN
  Administered 2023-09-26: 1 mg via INTRAVENOUS

## 2023-09-26 MED ORDER — IODIXANOL 320 MG/ML IV SOLN
INTRAVENOUS | Status: DC | PRN
  Administered 2023-09-26: 8 mL via INTRAVENOUS

## 2023-09-26 MED ORDER — MIDAZOLAM HCL 2 MG/2ML IJ SOLN
INTRAMUSCULAR | Status: AC
  Filled 2023-09-26: qty 2

## 2023-09-26 MED ORDER — LIDOCAINE HCL (PF) 1 % IJ SOLN
INTRAMUSCULAR | Status: DC | PRN
  Administered 2023-09-26: 2 mL

## 2023-09-26 MED ORDER — FENTANYL CITRATE (PF) 100 MCG/2ML IJ SOLN
INTRAMUSCULAR | Status: DC | PRN
  Administered 2023-09-26: 25 ug via INTRAVENOUS

## 2023-09-26 MED ORDER — LIDOCAINE HCL (PF) 1 % IJ SOLN
INTRAMUSCULAR | Status: AC
  Filled 2023-09-26: qty 30

## 2023-09-26 SURGICAL SUPPLY — 10 items
BAG SNAP BAND KOVER 36X36 (MISCELLANEOUS) ×2 IMPLANT
BALLN MUSTANG 7X80X75 (BALLOONS) ×2 IMPLANT
BALLOON MUSTANG 7X80X75 (BALLOONS) IMPLANT
CATH ANGIO 5F BER2 65CM (CATHETERS) IMPLANT
COVER DOME SNAP 22 D (MISCELLANEOUS) ×3 IMPLANT
GUIDEWIRE ANGLED .035 180CM (WIRE) IMPLANT
SHEATH PINNACLE R/O II 6F 4CM (SHEATH) IMPLANT
SYR MEDALLION 10ML (SYRINGE) IMPLANT
TRAY PV CATH (CUSTOM PROCEDURE TRAY) ×2 IMPLANT
WIRE BENTSON .035X145CM (WIRE) IMPLANT

## 2023-09-26 NOTE — H&P (Addendum)
 Chief Complaint: Decreased flows  Interval H&P  The patient has presented today for an angiogram/ angioplasty.  Various methods of treatment have been discussed with the patient.  After consideration of risk, benefits and other options for treatment, the patient has consented to a angiogram/ angioplasty with  possible stent placement.   Risks of angiogram with potential angioplasty and stenting if needed.contrast reaction, extravasation/ bleeding, dissection, hypotension and death were explained to the patient.  The patient's history has been reviewed and the patient has been examined, no changes in status.  Stable for angiogram/angioplasty  I have reviewed the patient's chart and labs.  Questions were answered to the patient's satisfaction.   Assessment/Plan: ESRD dialyzing  TTS regimen with last dialysis Tuesday Decreased access flows and a left upper arm AV graft placed by Dr. Arbie Cookey January 22, 2021.- planning on angiogram with possibly angioplasty. Renal osteodystrophy - continue binders per home regimen. Anemia - managed with ESA's and IV iron at dialysis center. HTN - resume home regimen.   HPI: Kyle Hicks is an 83 y.o. male with a history of hypertension, diastolic heart failure, diabetes, ESRD dialyzing Tuesday Thursday and Saturdays with last treatment on Saturday.  Patient is being referred for decreasing access flows in the left upper arm AV graft.  ROS Per HPI.  Chemistry and CBC: Creat  Date/Time Value Ref Range Status  04/08/2020 07:24 AM 1.55 (H) 0.70 - 1.18 mg/dL Final    Comment:    For patients >71 years of age, the reference limit for Creatinine is approximately 13% higher for people identified as African-American. .   04/30/2019 07:21 AM 1.39 (H) 0.70 - 1.18 mg/dL Final    Comment:    For patients >49 years of age, the reference limit for Creatinine is approximately 13% higher for people identified as African-American. .   12/26/2018 07:21 AM 1.46 (H)  0.70 - 1.18 mg/dL Final    Comment:    For patients >75 years of age, the reference limit for Creatinine is approximately 13% higher for people identified as African-American. .   09/12/2018 07:17 AM 1.27 (H) 0.70 - 1.18 mg/dL Final    Comment:    For patients >28 years of age, the reference limit for Creatinine is approximately 13% higher for people identified as African-American. .   05/12/2018 07:03 AM 1.53 (H) 0.70 - 1.18 mg/dL Final    Comment:    For patients >36 years of age, the reference limit for Creatinine is approximately 13% higher for people identified as African-American. Marland Kitchen   02/06/2018 07:09 AM 1.51 (H) 0.70 - 1.18 mg/dL Final    Comment:    For patients >35 years of age, the reference limit for Creatinine is approximately 13% higher for people identified as African-American. Marland Kitchen   09/19/2017 07:17 AM 1.38 (H) 0.70 - 1.18 mg/dL Final    Comment:    For patients >77 years of age, the reference limit for Creatinine is approximately 13% higher for people identified as African-American. .   06/20/2017 07:27 AM 1.29 (H) 0.70 - 1.18 mg/dL Final    Comment:    For patients >44 years of age, the reference limit for Creatinine is approximately 13% higher for people identified as African-American. .   06/20/2017 07:25 AM 1.29 (H) 0.70 - 1.18 mg/dL Final    Comment:    For patients >28 years of age, the reference limit for Creatinine is approximately 13% higher for people identified as African-American. Marland Kitchen   03/07/2017 07:14 AM  1.39 (H) 0.70 - 1.18 mg/dL Final    Comment:      For patients > or = 81 years of age: The upper reference limit for Creatinine is approximately 13% higher for people identified as African-American.     10/26/2016 07:10 AM 1.61 (H) 0.70 - 1.18 mg/dL Final    Comment:      For patients > or = 81 years of age: The upper reference limit for Creatinine is approximately 13% higher for people identified as African-American.      07/28/2016 07:56 AM 1.37 (H) 0.70 - 1.18 mg/dL Final    Comment:      For patients > or = 81 years of age: The upper reference limit for Creatinine is approximately 13% higher for people identified as African-American.     04/26/2016 07:13 AM 1.29 (H) 0.70 - 1.18 mg/dL Final    Comment:      For patients > or = 81 years of age: The upper reference limit for Creatinine is approximately 13% higher for people identified as African-American.     01/19/2016 07:19 AM 1.18 0.70 - 1.18 mg/dL Final    Comment:      For patients > or = 81 years of age: The upper reference limit for Creatinine is approximately 13% higher for people identified as African-American.     09/15/2015 07:18 AM 1.23 (H) 0.70 - 1.18 mg/dL Final   Creatinine, Ser  Date/Time Value Ref Range Status  09/13/2022 08:17 AM 6.82 (H) 0.76 - 1.27 mg/dL Final  52/84/1324 40:10 AM 5.37 (H) 0.76 - 1.27 mg/dL Final  27/25/3664 40:34 PM 5.34 (H) 0.61 - 1.24 mg/dL Final  74/25/9563 87:56 AM 4.60 (H) 0.61 - 1.24 mg/dL Final  43/32/9518 84:16 AM 5.42 (H) 0.61 - 1.24 mg/dL Final  60/63/0160 10:93 PM 6.17 (H) 0.61 - 1.24 mg/dL Final  23/55/7322 02:54 AM 5.25 (H) 0.61 - 1.24 mg/dL Final  27/12/2374 28:31 AM 4.40 (H) 0.61 - 1.24 mg/dL Final  51/76/1607 37:10 AM 5.45 (H) 0.61 - 1.24 mg/dL Final  62/69/4854 62:70 AM 4.80 (H) 0.61 - 1.24 mg/dL Final  35/00/9381 82:99 AM 4.29 (H) 0.61 - 1.24 mg/dL Final  37/16/9678 93:81 AM 3.52 (H) 0.61 - 1.24 mg/dL Final  01/75/1025 85:27 AM 2.72 (H) 0.61 - 1.24 mg/dL Final  78/24/2353 61:44 AM 3.52 (H) 0.61 - 1.24 mg/dL Final  31/54/0086 76:19 AM 3.48 (H) 0.61 - 1.24 mg/dL Final  50/93/2671 24:58 PM 3.35 (H) 0.61 - 1.24 mg/dL Final  09/98/3382 50:53 AM 3.25 (H) 0.61 - 1.24 mg/dL Final  97/67/3419 37:90 AM 3.02 (H) 0.61 - 1.24 mg/dL Final  24/03/7352 29:92 AM 2.82 (H) 0.61 - 1.24 mg/dL Final  42/68/3419 62:22 AM 2.59 (H) 0.61 - 1.24 mg/dL Final  97/98/9211 94:17 AM 2.63 (H) 0.61 - 1.24 mg/dL  Final  40/81/4481 85:63 AM 2.44 (H) 0.61 - 1.24 mg/dL Final  14/97/0263 78:58 AM 2.51 (H) 0.61 - 1.24 mg/dL Final  85/08/7739 28:78 AM 2.69 (H) 0.61 - 1.24 mg/dL Final  67/67/2094 70:96 PM 2.63 (H) 0.61 - 1.24 mg/dL Final  28/36/6294 76:54 AM 2.78 (H) 0.61 - 1.24 mg/dL Final  65/09/5463 68:12 AM 1.18 0.61 - 1.24 mg/dL Final  75/17/0017 49:44 AM 1.40 (H) 0.61 - 1.24 mg/dL Final  96/75/9163 84:66 AM 1.63 (H) 0.61 - 1.24 mg/dL Final  59/93/5701 77:93 AM 1.28 (H) 0.61 - 1.24 mg/dL Final  90/30/0923 30:07 AM 1.24 0.61 - 1.24 mg/dL Final  62/26/3335 45:62 PM 1.30 (H) 0.61 -  1.24 mg/dL Final  16/04/9603 54:09 AM 1.34 0.50 - 1.35 mg/dL Final  81/19/1478 29:56 AM 1.48 (H) 0.50 - 1.35 mg/dL Final  21/30/8657 84:69 PM 1.70 (H) 0.50 - 1.35 mg/dL Final  62/95/2841 32:44 AM 1.80 (H) 0.50 - 1.35 mg/dL Final  07/28/7251 66:44 AM 1.85 (H) 0.50 - 1.35 mg/dL Final  03/47/4259 56:38 PM 1.96 (H) 0.50 - 1.35 mg/dL Final  75/64/3329 51:88 PM 2.03 (H) 0.50 - 1.35 mg/dL Final  41/66/0630 16:01 PM 1.97 (H) 0.50 - 1.35 mg/dL Final  09/32/3557 32:20 PM 2.31 (H) 0.50 - 1.35 mg/dL Final  25/42/7062 37:62 PM 2.70 (H) 0.50 - 1.35 mg/dL Final  83/15/1761 60:73 PM 2.67 (H) 0.50 - 1.35 mg/dL Final   No results for input(s): "NA", "K", "CL", "CO2", "GLUCOSE", "BUN", "CREATININE", "CALCIUM", "PHOS" in the last 168 hours.  Invalid input(s): "ALB" No results for input(s): "WBC", "NEUTROABS", "HGB", "HCT", "MCV", "PLT" in the last 168 hours. Liver Function Tests: No results for input(s): "AST", "ALT", "ALKPHOS", "BILITOT", "PROT", "ALBUMIN" in the last 168 hours. No results for input(s): "LIPASE", "AMYLASE" in the last 168 hours. No results for input(s): "AMMONIA" in the last 168 hours. Cardiac Enzymes: No results for input(s): "CKTOTAL", "CKMB", "CKMBINDEX", "TROPONINI" in the last 168 hours. Iron Studies: No results for input(s): "IRON", "TIBC", "TRANSFERRIN", "FERRITIN" in the last 72  hours. PT/INR: @LABRCNTIP (inr:5)  Xrays/Other Studies: )No results found for this or any previous visit (from the past 48 hours). No results found.  PMH:   Past Medical History:  Diagnosis Date   Chronic kidney disease    Diabetes mellitus without complication (HCC)    Dialysis patient (HCC)    Diastolic CHF, acute (HCC) 10/08/2020   HTN (hypertension)     PSH:   Past Surgical History:  Procedure Laterality Date   AV FISTULA PLACEMENT Left 01/22/2021   Procedure: LEFT UPPER ARM ARTERIOVENOUS GORE-TEX GRAFT;  Surgeon: Larina Earthly, MD;  Location: [facility] ORS;  Service: Vascular;  Laterality: Left;   CARDIOVERSION N/A 10/10/2020   Procedure: CARDIOVERSION;  Surgeon: Lewayne Bunting, MD;  Location[facility] ENDOSCOPY;  Service: Cardiovascular;  Laterality: N/A;   ESOPHAGOGASTRODUODENOSCOPY N/A 04/27/2017   Procedure: ESOPHAGOGASTRODUODENOSCOPY (EGD);  Surgeon: Malissa Hippo, MD;  Location: [facility] ENDO SUITE;  Service: Endoscopy;  Laterality: N/A;   IR FLUORO GUIDE CV LINE RIGHT  10/23/2020   IR US GUIDANCE  10/23/2020   RIGHT HEART CATH N/A 10/16/2020   Procedure: RIGHT HEART CATH;  Surgeon: Dolores Patty, MD;  Location: [facility] INVASIVE CV LAB;  Service: Cardiovascular;  Laterality: N/A;   RIGHT HEART CATH AND CORONARY ANGIOGRAPHY N/A 10/24/2020   Procedure: RIGHT HEART CATH AND CORONARY ANGIOGRAPHY;  Surgeon: Dolores Patty, MD;  Location: [facility] INVASIVE CV LAB;  Service: Cardiovascular;  Laterality: N/A;   TEE WITHOUT CARDIOVERSION N/A 10/10/2020   Procedure: TRANSESOPHAGEAL ECHOCARDIOGRAM (TEE);  Surgeon: Lewayne Bunting, MD;  Location: [facility] ENDOSCOPY;  Service: Cardiovascular;  Laterality: N/A;   TEMPORARY DIALYSIS CATHETER  10/16/2020   Procedure: TEMPORARY DIALYSIS CATHETER;  Surgeon: Dolores Patty, MD;  Location: [facility] INVASIVE CV LAB;  Service: Cardiovascular;;    Allergies: No Known Allergies  Medications:   Prior to Admission medications   Medication Sig Start Date End Date  Taking? Authorizing Provider  amiodarone (PACERONE) 100 MG tablet Take 1 tablet (100 mg total) by mouth daily. NEEDS FOLLOW UP APPOINTMENT FOR ANYMORE REFILLS 08/08/23  Yes [place], Anderson Malta, FNP  apixaban (ELIQUIS) 5 MG TABS tablet Take 1  tablet (5 mg total) by mouth 2 (two) times daily. Needs follow up appointment for more refills 08/15/23  Yes Schlusser, Anderson Malta, FNP  atorvastatin (LIPITOR) 20 MG tablet Take 1 tablet by mouth once daily 07/25/23  Yes [place], [place], [place]  carboxymethylcellulose 1 % ophthalmic solution Place 1 drop into both eyes daily as needed (red eye).   Yes [provider]  lanthanum (FOSRENOL) 1000 MG chewable tablet Chew 1,000 mg by mouth 3 (three) times daily with meals.   Yes [provider]  midodrine (PROAMATINE) 10 MG tablet TAKE 1 TABLET BY MOUTH THREE TIMES A WEEK 07/25/23  Yes Milford, Jessica M, FNP  torsemide (DEMADEX) 100 MG tablet Take 1 tablet (100 mg total) by mouth 3 (three) times a week. NEEDS FOLLOW UP APPOINTMENT FOR MORE REFILLS 09/21/23  Yes [place], [place], [place]  TRADJENTA 5 MG TABS tablet Take 1 tablet by mouth once daily 08/08/23  Yes Nida, Denman George, MD  trolamine salicylate (ASPERCREME) 10 % cream Apply 1 Application topically daily as needed for muscle pain (on arm).   Yes [provider]  blood glucose meter kit and supplies KIT Dispense based on patient and insurance preference. Use up to 2 times daily as directed. (FOR ICD-10 E11.65) 04/12/18   Roma Kayser, MD  Blood Glucose Monitoring Suppl (ONETOUCH VERIO FLEX SYSTEM) w/Device KIT Use to test blood glucose twice daily. Dx Code: E11.65 03/25/21   Roma Kayser, MD  glipiZIDE (GLUCOTROL) 5 MG tablet Take 1 tablet by mouth once daily with breakfast Patient not taking: Reported on 09/23/2023 12/28/22   Roma Kayser, MD  glucose blood (ONETOUCH VERIO) test strip Use as instructed to check blood glucose twice daily. Dx Code: E11.65 03/25/21    Roma Kayser, MD  OneTouch Delica Lancets 33G MISC Use to check blood glucose twice daily. Dx Code: E11.65 03/25/21   Roma Kayser, MD    Discontinued Meds:   Medications Discontinued During This Encounter  Medication Reason   oxyCODONE-acetaminophen (PERCOCET) 5-325 MG tablet Completed Course   Polyethyl Glycol-Propyl Glycol (SYSTANE) 0.4-0.3 % SOLN Patient Preference   hydrocortisone cream 1 % Completed Course    Social History:  reports that he quit smoking about 36 years ago. His smoking use included cigarettes. He has never used smokeless tobacco. He reports that he does not drink alcohol and does not use drugs.  Family History:   Family History  Problem Relation Age of Onset   Hypertension Mother    Hyperlipidemia Mother    Hypertension Father    Hyperlipidemia Father    Colon cancer Neg Hx     There were no vitals taken for this visit. GEN: NAD, A&Ox3, NCAT HEENT: No conjunctival pallor, EOMI NECK: Supple, no thyromegaly LUNGS: CTA B/L no rales, rhonchi or wheezing CV: RRR, No M/R/G ABD: SNDNT +BS  EXT: No lower extremity edema ACCESS: lt AVG hyperpulsatile towards the outflow       Ethelene Hal, MD 09/26/2023, 8:41 AM

## 2023-09-26 NOTE — Op Note (Signed)
 Patient presents with decreased access flows and prolonged cannulation site bleeding from his left upper arm straight graft placed  on January 22, 2021 by Dr. Arbie Cookey. On exam the LUA straight AVG is hyperpulsatile.   Summary:  1) Successful angiogram of a left upper arm straight AVG   with evidence of a 80% venous anastomosis to axillary vein stenosis treated to 10% with a 7x8 Mustang FE ~15 atm.  Outflow edge of stent in the cannulation zone as well as 70% arterial limb intragraft stenosis also treated with a 7 x 8 Mustang balloon fully effaced at approximately 12 atm of pressure.  Much more rapid flows post angioplasty with no evidence of extravasation or dissection.  Prior to angioplasty + with the [facility] across the lesions flow was basically nonexistent. 2) Stent in the cannulation zone of the repair and except for the edges: the central veins and inflow were widely patent. 3) This left upper arm straight graft remains amenable to future percutaneous intervention. If lesion recurs in < 3months, would favor stent graft placement.   Description of procedure: The left upper arm was prepped and draped in the usual fashion. The left upper arm straight AVG was cannulated (11914) in the arterial limb of the graft in an antegrade direction with an 18G needle and then a 6 Fr sheath was inserted by guidewire exchange technique. The angiogram revealed a 70% arterial limb intragraft stenosis, outflow edge of stent 80% stenosis, 80% venous anastomosis stenosis. The central veins and inflow anastomosis were patent.  A guidewire was easily advanced past the outflow stenosis and parked in the central veins the aid of a Bernstein catheter. I then advanced a 7 x 8 Mustang angioplasty balloon through the antegrade sheath over the guidewire to the level of the venous anastomosis to axillary vein, and outflow edge of stent and finally inflow limb intragraft stenoses. Venous angioplasty was performed to 100% balloon  effacement with approximately 12-16ATM of pressure via a hand syringe assembly.    Final arteriogram and completion venogram revealed no evidence of extravasation or dissection, more rapid access flows through the graft and 10% residual stenosis at all levels of stenosis.  Hemostasis: A 3-0 ethilon purse string suture was placed at the cannulation site on removal of the sheath.  Sedation: 1 mg Versed, 25 mcg Fentanyl.  Sedation time: 14 minutes  Contrast. 8 mL  Monitoring: Because of the patient's comorbid conditions and sedation during the procedure, continuous EKG monitoring and O2 saturation monitoring was performed throughout the procedure by the RN. There were no abnormal arrhythmias encountered.  Complications: None.   Diagnoses: I87.1 Stricture of vein  N18.6 ESRD T82.858A Stricture of access  Procedure Coding:  [phone] Cannulation and angiogram of fistula, venous angioplasty (AVG VA) A2130 Contrast  Recommendations:  1. Continue to cannulate the fistula with 15G needles.  2. Refer back for problems with flows. 3. Remove the suture next treatment.   Discharge: The patient was discharged home in stable condition. The patient was given education regarding the care of the dialysis access AVF and specific instructions in case of any problems.

## 2023-09-26 NOTE — Discharge Instructions (Signed)

## 2023-09-27 ENCOUNTER — Other Ambulatory Visit (HOSPITAL_COMMUNITY): Payer: Self-pay | Admitting: Family Medicine

## 2023-10-04 ENCOUNTER — Other Ambulatory Visit (HOSPITAL_COMMUNITY): Payer: Self-pay | Admitting: Family Medicine

## 2023-10-07 ENCOUNTER — Other Ambulatory Visit (HOSPITAL_COMMUNITY): Payer: Self-pay | Admitting: Family Medicine

## 2023-10-24 DIAGNOSIS — I150 Renovascular hypertension: Secondary | ICD-10-CM | POA: Diagnosis not present

## 2023-10-24 DIAGNOSIS — N186 End stage renal disease: Secondary | ICD-10-CM | POA: Diagnosis not present

## 2023-10-24 DIAGNOSIS — Z992 Dependence on renal dialysis: Secondary | ICD-10-CM | POA: Diagnosis not present

## 2023-10-25 ENCOUNTER — Other Ambulatory Visit (HOSPITAL_COMMUNITY): Payer: Self-pay | Admitting: Family Medicine

## 2023-11-03 ENCOUNTER — Other Ambulatory Visit (HOSPITAL_COMMUNITY): Payer: Self-pay | Admitting: Family Medicine

## 2023-11-03 ENCOUNTER — Other Ambulatory Visit: Payer: Self-pay | Admitting: "Endocrinology

## 2023-11-23 DIAGNOSIS — I150 Renovascular hypertension: Secondary | ICD-10-CM | POA: Diagnosis not present

## 2023-11-23 DIAGNOSIS — Z992 Dependence on renal dialysis: Secondary | ICD-10-CM | POA: Diagnosis not present

## 2023-11-23 DIAGNOSIS — N186 End stage renal disease: Secondary | ICD-10-CM | POA: Diagnosis not present

## 2023-12-06 ENCOUNTER — Other Ambulatory Visit (HOSPITAL_COMMUNITY): Payer: Self-pay | Admitting: Family Medicine

## 2023-12-07 ENCOUNTER — Other Ambulatory Visit (HOSPITAL_COMMUNITY): Payer: Self-pay | Admitting: Family Medicine

## 2023-12-08 ENCOUNTER — Telehealth (HOSPITAL_COMMUNITY): Payer: Self-pay | Admitting: *Deleted

## 2023-12-08 ENCOUNTER — Telehealth (HOSPITAL_COMMUNITY): Payer: Self-pay | Admitting: Cardiology

## 2023-12-08 NOTE — Telephone Encounter (Signed)
 Called to confirm/remind patient of their appointment at the [facility] on 10/28/23***.   Appointment:   [x] Confirmed  [] Left mess   [] No answer/No voice mail  [] Phone not in service  Patient reminded to bring all medications and/or complete list.  Confirmed patient has transportation. Gave directions, instructed to utilize valet parking.

## 2023-12-08 NOTE — Telephone Encounter (Signed)
 Front office received a message from [place], RN that this patient needs an appt for refills. Left message with instructions for pt to call back to scheduel appt - - per RN this pt needs refills.

## 2023-12-09 ENCOUNTER — Other Ambulatory Visit (HOSPITAL_COMMUNITY): Payer: Self-pay | Admitting: Family Medicine

## 2023-12-09 ENCOUNTER — Encounter (HOSPITAL_COMMUNITY): Payer: Self-pay

## 2023-12-09 ENCOUNTER — Ambulatory Visit (HOSPITAL_COMMUNITY): Payer: Self-pay | Admitting: Family Medicine

## 2023-12-09 ENCOUNTER — Ambulatory Visit (HOSPITAL_COMMUNITY)
Admission: RE | Admit: 2023-12-09 | Discharge: 2023-12-09 | Disposition: A | Discharge status: Home / Self Care | Source: Ambulatory Visit | Attending: Family Medicine | Admitting: Family Medicine

## 2023-12-09 VITALS — BP 118/70 | HR 89 | Wt 195.2 lb

## 2023-12-09 DIAGNOSIS — K279 Peptic ulcer, site unspecified, unspecified as acute or chronic, without hemorrhage or perforation: Secondary | ICD-10-CM | POA: Diagnosis not present

## 2023-12-09 DIAGNOSIS — I480 Paroxysmal atrial fibrillation: Secondary | ICD-10-CM | POA: Diagnosis not present

## 2023-12-09 DIAGNOSIS — Z79899 Other long term (current) drug therapy: Secondary | ICD-10-CM | POA: Diagnosis not present

## 2023-12-09 DIAGNOSIS — I5082 Biventricular heart failure: Secondary | ICD-10-CM | POA: Insufficient documentation

## 2023-12-09 DIAGNOSIS — Z794 Long term (current) use of insulin: Secondary | ICD-10-CM

## 2023-12-09 DIAGNOSIS — I4892 Unspecified atrial flutter: Secondary | ICD-10-CM | POA: Insufficient documentation

## 2023-12-09 DIAGNOSIS — E1122 Type 2 diabetes mellitus with diabetic chronic kidney disease: Secondary | ICD-10-CM | POA: Diagnosis not present

## 2023-12-09 DIAGNOSIS — Z7901 Long term (current) use of anticoagulants: Secondary | ICD-10-CM | POA: Insufficient documentation

## 2023-12-09 DIAGNOSIS — Z7984 Long term (current) use of oral hypoglycemic drugs: Secondary | ICD-10-CM | POA: Diagnosis not present

## 2023-12-09 DIAGNOSIS — I5022 Chronic systolic (congestive) heart failure: Secondary | ICD-10-CM

## 2023-12-09 DIAGNOSIS — Z992 Dependence on renal dialysis: Secondary | ICD-10-CM

## 2023-12-09 DIAGNOSIS — I4510 Unspecified right bundle-branch block: Secondary | ICD-10-CM | POA: Diagnosis not present

## 2023-12-09 DIAGNOSIS — N179 Acute kidney failure, unspecified: Secondary | ICD-10-CM | POA: Insufficient documentation

## 2023-12-09 DIAGNOSIS — N186 End stage renal disease: Secondary | ICD-10-CM | POA: Diagnosis not present

## 2023-12-09 DIAGNOSIS — I2510 Atherosclerotic heart disease of native coronary artery without angina pectoris: Secondary | ICD-10-CM | POA: Diagnosis not present

## 2023-12-09 DIAGNOSIS — I132 Hypertensive heart and chronic kidney disease with heart failure and with stage 5 chronic kidney disease, or end stage renal disease: Secondary | ICD-10-CM | POA: Insufficient documentation

## 2023-12-09 DIAGNOSIS — I110 Hypertensive heart disease with heart failure: Secondary | ICD-10-CM | POA: Diagnosis present

## 2023-12-09 LAB — HEPATIC FUNCTION PANEL
ALT: 17 U/L (ref 0–44)
AST: 19 U/L (ref 15–41)
Albumin: 3.5 g/dL (ref 3.5–5.0)
Alkaline Phosphatase: 56 U/L (ref 38–126)
Bilirubin, Direct: <0.1 mg/dL (ref 0.0–0.2)
Total Bilirubin: 0.6 mg/dL (ref 0.0–1.2)
Total Protein: 8 g/dL (ref 6.5–8.1)

## 2023-12-09 LAB — LIPID PANEL
Cholesterol: 145 mg/dL (ref 0–200)
HDL: 62 mg/dL (ref >40)
LDL Cholesterol: 66 mg/dL (ref 0–99)
Total CHOL/HDL Ratio: 2.3 ratio
Triglycerides: 83 mg/dL (ref <150)
VLDL: 17 mg/dL (ref 0–40)

## 2023-12-09 LAB — TSH: TSH: 1.444 u[IU]/mL (ref 0.350–4.500)

## 2023-12-09 MED ORDER — APIXABAN 2.5 MG PO TABS: 2.5000 mg | ORAL_TABLET | Freq: Two times a day (BID) | ORAL | 3 refills | Status: AC

## 2023-12-09 NOTE — Progress Notes (Signed)
 [facility] Note PCP: Dr. Glady Laming Nephrology: Dr. Irene Mannheim HF Cardiologist: Dr. Julane Ny   HPI: Kyle Hicks is a 83 y.o. with history of HTN, DMII, chronic biventricular heart failure, A flutter, AKI/ESRD, PUD, and GI bleed.    Echo 2018 and EF was 55%.    Admitted to [facility] 10/07/20 with weakness and diarrhea. On arrival EKG showed A flutter RVR. Hospital course complicated by AKI and Acute Biventricular Heart Failure. Echo showed LVEF 10-15% and severely reduced RV.  Attempted to diurese with IV lasix  and milrinone  but ultimately developed AKI. Placed on HD and was able to tolerate. 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  He failed cardioversion but chemically converted with amiodarone . Nephrology recommended continuing torsemide  100 mg at discharge.   Echo 03/11/21 EF 40-45%   Follow up 8/22, doing well on iHD. Some low BP at HD and gets fluid back. Low dose Coreg  added.   Last seen 11/23  Today he returns for HF follow up. Overall feeling fine. No BP issues at dialysis. No SOB with walking up steps or grocery shopping. Denies palpitations, abnormal bleeding, CP, dizziness, edema, or PND/Orthopnea. Appetite ok.  Weight at home 195-196 pounds. Taking all medications, takes torsemide  on non-HD days, still makes urine.    Cardiac Testing   - Echo (8/22): EF 40-45%.  - Echo (3/22): EF 10-15% RV severely reduced. IVC dilated suggesting RA pressure 15.  - TEE (10/10/20): w/ severe Biv dysfunction, Mod-severe Kyle, severe TR. No LAA thrombus. DCCV unsuccessful   - RHC (10/24/20) Ao = 89/52 (68) RA = 10 RV = 45/12 PA = 47/17 (28) PCW = 9 Fick cardiac output/index = 6.6/3.1 PVR =2.9 FA sat = 98% PA sat = 65%, 67%   1. 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 RCA not injected 2. Mild PAH 3. 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  - Echo (2018): EF 55%.   ROS: All systems  negative except as listed in HPI, PMH and Problem List.  SH:  Social History   Socioeconomic History   Marital status: Married    Spouse name: Not on file   Number of children: Not on file   Years of education: Not on file   Highest education level: Not on file  Occupational History   Not on file  Tobacco Use   Smoking status: Former    Current packs/day: 0.00    Types: Cigarettes    Quit date: 07/26/1987    Years since quitting: 36.3   Smokeless tobacco: Never  Vaping Use   Vaping status: Never Used  Substance and Sexual Activity   Alcohol use: No    Alcohol/week: 0.0 standard drinks of alcohol    Comment: Previously drank socially, last drank 25-30 years ago   Drug use: No   Sexual activity: Not on file  Other Topics Concern   Not on file  Social History Narrative   Not on file   Social Drivers of Health   Financial Resource Strain: Low Risk  (01/17/2023)   Overall Financial Resource Strain (CARDIA)    Difficulty of Paying Living Expenses: Not very hard  Food Insecurity: No Food Insecurity (01/17/2023)   Hunger Vital Sign    Worried About Running Out of Food in the Last Year: Never true    Ran Out of Food in the Last Year: Never true  Transportation Needs: No Transportation Needs (01/31/2023)   PRAPARE - Transportation  Lack of Transportation (Medical): No    Lack of Transportation (Non-Medical): No  Physical Activity: Inactive (01/31/2023)   Exercise Vital Sign    Days of Exercise per Week: 0 days    Minutes of Exercise per Session: 0 min  Stress: Not on file  Social Connections: Not on file  Intimate Partner Violence: Not on file   FH:  Family History  Problem Relation Age of Onset   Hypertension Mother    Hyperlipidemia Mother    Hypertension Father    Hyperlipidemia Father    Colon cancer Neg Hx    Past Medical History:  Diagnosis Date   Chronic kidney disease    Diabetes mellitus without complication (HCC)    Dialysis patient (HCC)    Diastolic  CHF, acute (HCC) 10/08/2020   HTN (hypertension)    Current Outpatient Medications  Medication Sig Dispense Refill   atorvastatin  (LIPITOR) 20 MG tablet Take 1 tablet by mouth once daily 90 tablet 0   blood glucose meter kit and supplies KIT Dispense based on patient and insurance preference. Use up to 2 times daily as directed. (FOR ICD-10 E11.65) 1 each 5   Blood Glucose Monitoring Suppl (ONETOUCH VERIO FLEX SYSTEM) w/Device KIT Use to test blood glucose twice daily. Dx Code: E11.65 1 kit 1   carboxymethylcellulose 1 % ophthalmic solution Place 1 drop into both eyes daily as needed (red eye).     ELIQUIS  5 MG TABS tablet TAKE 1 TABLET BY MOUTH TWICE DAILY . APPOINTMENT REQUIRED FOR FUTURE REFILLS 60 tablet 0   glucose blood (ONETOUCH VERIO) test strip Use as instructed to check blood glucose twice daily. Dx Code: E11.65 200 each 0   lanthanum (FOSRENOL) 1000 MG chewable tablet Chew 1,000 mg by mouth 3 (three) times daily with meals.     midodrine  (PROAMATINE ) 10 MG tablet TAKE 1 TABLET BY MOUTH THREE TIMES A WEEK 30 tablet 0   OneTouch Delica Lancets 33G MISC Use to check blood glucose twice daily. Dx Code: E11.65 100 each 1   PACERONE  100 MG tablet TAKE 1 TABLET BY MOUTH ONCE DAILY. APPOINMENT NEEDED FOR FUTURE REFILLS 30 tablet 0   torsemide  (DEMADEX ) 100 MG tablet TAKE 1 TABLET BY MOUTH THREE TIMES A WEEK . APPOINTMENT REQUIRED FOR FUTURE REFILLS 15 tablet 0   TRADJENTA  5 MG TABS tablet Take 1 tablet by mouth once daily 90 tablet 0   trolamine salicylate (ASPERCREME) 10 % cream Apply 1 Application topically daily as needed for muscle pain (on arm).     No current facility-administered medications for this encounter.   BP 118/70   Pulse 89   Wt 88.5 kg (195 lb 3.2 oz)   SpO2 97%   BMI 34.58 kg/m   Wt Readings from Last 3 Encounters:  12/09/23 88.5 kg (195 lb 3.2 oz)  09/26/23 89.4 kg (197 lb)  06/29/23 89.9 kg (198 lb 3.2 oz)   PHYSICAL EXAM: General:  NAD. No resp difficulty,  walked into clinic, elderly HEENT: Normal Neck: Supple. No JVD. Thick neck Cor: Regular rate & rhythm. No rubs, gallops or murmurs. Lungs: Clear Abdomen: Soft, obese, nontender, nondistended.  Extremities: No cyanosis, clubbing, rash, edema; LUE AVF  Neuro: Alert & oriented x 3, moves all 4 extremities w/o difficulty. Affect pleasant.  ECG (personally reviewed): NSR with PACs, 89 bpm  ASSESSMENT & PLAN: 1. PAF/AFL - Remains in NSR on amio 100 mg daily. - Now >44 years old, drop Eliquis  to 2.5 mg bid.  -  Check LFTs and TSH today. Needs regular eye exam while on amio   2. Chronic Biventricular Heart Failure - Echo (2018): EF 55%. - Echo (09/2020): EF < 15%. Suspect tachy-mediated.  - RHC (10/16/20): with elevated filling pressures R>L and preserved cardiac output. 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 RCA not injected.  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 - Echo (3/22): EF improved to 40-45%.  - NYHA I-II. Volume managed by HD.   - Continue torsemide  20 mg on non-HD days, per renal - Continue midodrine  10 mg 3x/week - Update echo   3. 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 - Continue atorvastatin . - Off ASA with DOAC. - Check lipids today.  4. ESRD - Started HD in April 2022 and has been tolerating without difficulty. - On torsemide  per nephrology.  - No change   5. DM2 - On Tradjenta . - Followed by Endocrinology.  6. PUD - EGD 2018 with duodenal ulcer.  - No longer on PPI. - No change  Follow up in 6 months with Dr. Julane Ny.  Kyle Hacking, FNP  8:34 AM

## 2023-12-09 NOTE — Patient Instructions (Signed)
 Decrease Eliquis  to 2.5 mg twice daily - new Rx sent. Labs today - will call you if abnormal. We have ordered an echo and will call you to schedule. Return to see Dr. Julane Ny in 6 months. PLEASE CALL US  AT [phone] IN OCTOBER TO SCHEDULE THIS APPOINTMENT. Please call us  at [phone] if any questions or concerns prior to your next visit.

## 2023-12-09 NOTE — Addendum Note (Signed)
 Encounter addended by: Edman Gory, RN on: 12/09/2023 10:05 AM  Actions taken: Charge Capture section accepted

## 2023-12-24 ENCOUNTER — Other Ambulatory Visit (HOSPITAL_COMMUNITY): Payer: Self-pay | Admitting: Family Medicine

## 2023-12-24 DIAGNOSIS — Z992 Dependence on renal dialysis: Secondary | ICD-10-CM | POA: Diagnosis not present

## 2023-12-24 DIAGNOSIS — N186 End stage renal disease: Secondary | ICD-10-CM | POA: Diagnosis not present

## 2023-12-24 DIAGNOSIS — I150 Renovascular hypertension: Secondary | ICD-10-CM | POA: Diagnosis not present

## 2023-12-28 ENCOUNTER — Ambulatory Visit: Payer: Medicare HMO | Admitting: "Endocrinology

## 2023-12-28 ENCOUNTER — Encounter: Payer: Self-pay | Admitting: "Endocrinology

## 2023-12-28 VITALS — BP 90/58 | HR 88 | Ht 66.5 in | Wt 194.8 lb

## 2023-12-28 DIAGNOSIS — Z7984 Long term (current) use of oral hypoglycemic drugs: Secondary | ICD-10-CM

## 2023-12-28 DIAGNOSIS — E785 Hyperlipidemia, unspecified: Secondary | ICD-10-CM | POA: Diagnosis not present

## 2023-12-28 DIAGNOSIS — N186 End stage renal disease: Secondary | ICD-10-CM | POA: Diagnosis not present

## 2023-12-28 DIAGNOSIS — E669 Obesity, unspecified: Secondary | ICD-10-CM

## 2023-12-28 DIAGNOSIS — E1122 Type 2 diabetes mellitus with diabetic chronic kidney disease: Secondary | ICD-10-CM | POA: Diagnosis not present

## 2023-12-28 DIAGNOSIS — Z6830 Body mass index (BMI) 30.0-30.9, adult: Secondary | ICD-10-CM

## 2023-12-28 DIAGNOSIS — I10 Essential (primary) hypertension: Secondary | ICD-10-CM

## 2023-12-28 DIAGNOSIS — I120 Hypertensive chronic kidney disease with stage 5 chronic kidney disease or end stage renal disease: Secondary | ICD-10-CM

## 2023-12-28 DIAGNOSIS — E782 Mixed hyperlipidemia: Secondary | ICD-10-CM

## 2023-12-28 LAB — POCT GLYCOSYLATED HEMOGLOBIN (HGB A1C): HbA1c, POC (controlled diabetic range): 7.1 % — AB (ref 0.0–7.0)

## 2023-12-28 MED ORDER — ACCU-CHEK GUIDE ME W/DEVICE KIT: 1.0000 | PACK | 0 refills | Status: DC

## 2023-12-28 NOTE — Patient Instructions (Signed)

## 2023-12-28 NOTE — Progress Notes (Signed)
 12/28/2023  Endocrinology follow-up note   Subjective:    Patient ID: Kyle Hicks, male    DOB: [birth year], PCP Minus Amel, MD   Past Medical History:  Diagnosis Date   Chronic kidney disease    Diabetes mellitus without complication [facility])    Dialysis patient [facility])    Diastolic CHF, acute (HCC) 10/08/2020   HTN (hypertension)    Past Surgical History:  Procedure Laterality Date   A/V FISTULAGRAM N/A 09/26/2023   Procedure: A/V Fistulagram;  Surgeon: Patrick Boor, MD;  Location: [facility] INVASIVE CV LAB;  Service: Cardiovascular;  Laterality: N/A;   AV FISTULA PLACEMENT Left 01/22/2021   Procedure: LEFT UPPER ARM ARTERIOVENOUS GORE-TEX GRAFT;  Surgeon: Mayo Speck, MD;  Location: [facility] ORS;  Service: Vascular;  Laterality: Left;   CARDIOVERSION N/A 10/10/2020   Procedure: CARDIOVERSION;  Surgeon: Lenise Quince, MD;  Location: [facility] ENDOSCOPY;  Service: Cardiovascular;  Laterality: N/A;   ESOPHAGOGASTRODUODENOSCOPY N/A 04/27/2017   Procedure: ESOPHAGOGASTRODUODENOSCOPY (EGD);  Surgeon: Ruby Corporal, MD;  Location: [facility] ENDO SUITE;  Service: Endoscopy;  Laterality: N/A;   IR FLUORO GUIDE CV LINE RIGHT  10/23/2020   IR US  GUIDANCE  10/23/2020   PERIPHERAL VASCULAR BALLOON ANGIOPLASTY  09/26/2023   Procedure: PERIPHERAL VASCULAR BALLOON ANGIOPLASTY;  Surgeon: Patrick Boor, MD;  Location: [facility] INVASIVE CV LAB;  Service: Cardiovascular;;  Venous Anastomosis; Intragraft - Outflow Edge of Stent   RIGHT HEART CATH N/A 10/16/2020   Procedure: RIGHT HEART CATH;  Surgeon: Mardell Shade, MD;  Location: [facility] INVASIVE CV LAB;  Service: Cardiovascular;  Laterality: N/A;   RIGHT HEART CATH AND CORONARY ANGIOGRAPHY N/A 10/24/2020   Procedure: RIGHT HEART CATH AND CORONARY ANGIOGRAPHY;  Surgeon: Mardell Shade, MD;  Location: [facility] INVASIVE CV LAB;  Service: Cardiovascular;  Laterality: N/A;   TEE WITHOUT CARDIOVERSION N/A 10/10/2020   Procedure: TRANSESOPHAGEAL ECHOCARDIOGRAM (TEE);  Surgeon: Lenise Quince, MD;  Location: [facility] ENDOSCOPY;  Service: Cardiovascular;  Laterality: N/A;   TEMPORARY DIALYSIS CATHETER  10/16/2020   Procedure: TEMPORARY DIALYSIS CATHETER;  Surgeon: Mardell Shade, MD;  Location: [facility] INVASIVE CV LAB;  Service: Cardiovascular;;   Social History   Socioeconomic History   Marital status: Married    Spouse name: Not on file   Number of children: Not on file   Years of education: Not on file   Highest education level: Not on file  Occupational History   Not on file  Tobacco Use   Smoking status: Former    Current packs/day: 0.00    Types: Cigarettes    Quit date: 07/26/1987    Years since quitting: 36.4   Smokeless tobacco: Never  Vaping Use   Vaping status: Never Used  Substance and Sexual Activity   Alcohol use: No    Alcohol/week: 0.0 standard drinks of alcohol    Comment: Previously drank socially, last drank 25-30 years ago   Drug use: No   Sexual activity: Not on file  Other Topics Concern   Not on file  Social History Narrative   Not on file   Social Drivers of Health   Financial Resource Strain: Low Risk  (01/17/2023)   Overall Financial Resource Strain (CARDIA)    Difficulty of Paying Living Expenses: Not very hard  Food Insecurity: No Food Insecurity (01/17/2023)   Hunger Vital Sign    Worried About Running Out of Food in the Last Year: Never true    Ran Out of Food in the Last  Year: Never true  Transportation Needs: No Transportation Needs (01/31/2023)   PRAPARE - Administrator, Civil Service (Medical): No    Lack of Transportation (Non-Medical): No  Physical Activity: Inactive (01/31/2023)   Exercise Vital Sign    Days of Exercise per Week: 0 days    Minutes of Exercise per Session: 0 min  Stress: Not on file  Social Connections: Not on file   Outpatient Encounter Medications as of 12/28/2023  Medication Sig   Blood Glucose Monitoring Suppl (ACCU-CHEK GUIDE ME) w/Device KIT 1 Piece by Does not apply route as directed.    apixaban  (ELIQUIS ) 2.5 MG TABS tablet Take 1 tablet (2.5 mg total) by mouth 2 (two) times daily.   atorvastatin  (LIPITOR) 20 MG tablet Take 1 tablet by mouth once daily   blood glucose meter kit and supplies KIT Dispense based on patient and insurance preference. Use up to 2 times daily as directed. (FOR ICD-10 E11.65)   carboxymethylcellulose 1 % ophthalmic solution Place 1 drop into both eyes daily as needed (red eye).   glucose blood (ONETOUCH VERIO) test strip Use as instructed to check blood glucose twice daily. Dx Code: E11.65   lanthanum (FOSRENOL) 1000 MG chewable tablet Chew 1,000 mg by mouth 3 (three) times daily with meals.   midodrine  (PROAMATINE ) 10 MG tablet TAKE 1 TABLET BY MOUTH THREE TIMES A WEEK   OneTouch Delica Lancets 33G MISC Use to check blood glucose twice daily. Dx Code: E11.65   PACERONE  100 MG tablet TAKE 1 TABLET BY MOUTH ONCE DAILY. APPOINMENT NEEDED FOR FUTURE REFILLS   torsemide  (DEMADEX ) 100 MG tablet TAKE 1 TABLET BY MOUTH THREE TIMES A WEEK . APPOINTMENT REQUIRED FOR FUTURE REFILLS   TRADJENTA  5 MG TABS tablet Take 1 tablet by mouth once daily   trolamine salicylate (ASPERCREME) 10 % cream Apply 1 Application topically daily as needed for muscle pain (on arm).   [DISCONTINUED] Blood Glucose Monitoring Suppl (ONETOUCH VERIO FLEX SYSTEM) w/Device KIT Use to test blood glucose twice daily. Dx Code: E11.65   No facility-administered encounter medications on file as of 12/28/2023.   ALLERGIES: No Known Allergies VACCINATION STATUS: Immunization History  Administered Date(s) Administered   Moderna Sars-Covid-2 Vaccination 09/23/2019, 10/27/2019    Diabetes He presents for his follow-up diabetic visit. He has type 2 diabetes mellitus. The initial diagnosis of diabetes was made 2 years ago. His disease course has been fluctuating. There are no hypoglycemic associated symptoms. Pertinent negatives for hypoglycemia include no confusion, headaches, pallor or seizures.  There are no diabetic associated symptoms. Pertinent negatives for diabetes include no chest pain, no fatigue, no polydipsia, no polyphagia, no polyuria and no weakness. There are no hypoglycemic complications. Symptoms are improving. Diabetic complications include nephropathy. Risk factors for coronary artery disease include diabetes mellitus, dyslipidemia, hypertension, male sex, obesity, sedentary lifestyle and tobacco exposure. He is compliant with treatment most of the time. His weight is fluctuating minimally (He lost approximately 60 pounds since last visit.). He is following a generally unhealthy diet. He has had a previous visit with a dietitian. He never participates in exercise. His home blood glucose trend is fluctuating minimally. (He did not bring any logs nor meter with him.  His point-of-care a1C  is7.1% remains stable compared to his last presentation.  He denies hypoglycemia.   He is taking only Tradjenta  5 mg p.o. daily for diabetes at this time.      ) An ACE inhibitor/angiotensin II receptor blocker is being taken.  Hypertension This is a chronic problem. The current episode started more than 1 year ago. The problem is uncontrolled. Pertinent negatives include no chest pain, headaches, neck pain, palpitations or shortness of breath. Risk factors for coronary artery disease include diabetes mellitus, dyslipidemia, obesity, sedentary lifestyle and smoking/tobacco exposure. Past treatments include angiotensin blockers. Identifiable causes of hypertension include chronic renal disease.  Hyperlipidemia This is a chronic problem. The current episode started more than 1 year ago. Exacerbating diseases include chronic renal disease, diabetes and obesity. Pertinent negatives include no chest pain, myalgias or shortness of breath. Current antihyperlipidemic treatment includes statins. Risk factors for coronary artery disease include diabetes mellitus, dyslipidemia, hypertension, family history,  male sex, obesity and a sedentary lifestyle.      Objective:    BP (!) 90/58   Pulse 88   Ht 5' 6.5" (1.689 m)   Wt 194 lb 12.8 oz (88.4 kg)   BMI 30.97 kg/m   Wt Readings from Last 3 Encounters:  12/28/23 194 lb 12.8 oz (88.4 kg)  12/09/23 195 lb 3.2 oz (88.5 kg)  09/26/23 197 lb (89.4 kg)      Results for orders placed or performed in visit on 12/28/23  HgB A1c   Collection Time: 12/28/23 10:25 AM  Result Value Ref Range   Hemoglobin A1C     HbA1c POC (<> result, manual entry)     HbA1c, POC (prediabetic range)     HbA1c, POC (controlled diabetic range) 7.1 (A) 0.0 - 7.0 %   Diabetic Labs (most recent): Lab Results  Component Value Date   HGBA1C 7.1 (A) 12/28/2023   HGBA1C 7.6 (A) 02/09/2023   HGBA1C 6.8 09/20/2022   MICROALBUR 142.0 12/04/2019   MICROALBUR 1.2 06/20/2017   Lipid Panel     Component Value Date/Time   CHOL 145 12/09/2023 0852   CHOL 142 09/13/2022 0817   TRIG 83 12/09/2023 0852   HDL 62 12/09/2023 0852   HDL 64 09/13/2022 0817   CHOLHDL 2.3 12/09/2023 0852   VLDL 17 12/09/2023 0852   LDLCALC 66 12/09/2023 0852   LDLCALC 65 09/13/2022 0817   LDLCALC 85 04/08/2020 0724     Assessment & Plan:   1. 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     - He remains at a high risk for more acute and chronic complications of diabetes which include CAD, CVA, retinopathy, and neuropathy. These are all discussed in detail with the patient.  He did not bring any logs nor meter with him.  His point-of-care a1C  is7.1% remains stable compared to his last presentation.  He denies hypoglycemia.   He is taking only Tradjenta  5 mg p.o. daily for diabetes at this time.    - I have re-counseled the patient on diet management and weight loss  by adopting a carbohydrate restricted / protein rich  Diet.  - he acknowledges that there is a room for improvement in his food and drink choices. - Suggestion is made for him to avoid simple  carbohydrates  from his diet including Cakes, Sweet Desserts, Ice Cream, Soda (diet and regular), Sweet Tea, Candies, Chips, Cookies, Store Bought Juices, Alcohol in Excess of  1-2 drinks a day, Artificial Sweeteners,  Coffee Creamer, and "Sugar-free" Products, Lemonade. This will help patient to have more stable blood glucose profile and potentially avoid unintended weight gain.  - Patient is advised to stick to a routine mealtimes to eat 3 meals  a day and avoid unnecessary snacks (  to snack only to correct hypoglycemia).  - I have approached patient with the following individualized plan to manage diabetes and patient agrees.   -In light of his presentation with A1c of 7.1%, he will not need glipizide .  He is advised to continue Tradjenta  5 mg p.o. daily at breakfast.    -He is encouraged to monitor blood glucose at least once a day before breakfast and call clinic for blood glucose readings below 70 or greater than 200 mg/dl at fasting. - Patient specific target  for A1c; LDL, HDL, Triglycerides,  were discussed in detail.  2) BP/HTN:  His blood pressure is controlled to target at 90/58.  Patient is asymptomatic.  He was taken off of Coreg  last visit.  Blood pressure management deferred to nephrology.  He is also  torsemide  100 mg p.o. daily.  He is on midodrine  for orthostatic hypotension.   3) Lipids/HPL: His recent lipid panel showed controlled LDL at 66.  He is advised to continue atorvastatin  20 mg p.o. nightly.      Side effects and precautions discussed with him.    4)  Weight/Diet: His BMI is 30.97- He has lost weight related to initiation of hemodialysis.   5) Chronic Care/Health Maintenance:  -Patient is on ACEI/ARB and Statin medications and encouraged to continue to follow up with Ophthalmology, nephrology, cardiology, podiatrist at least yearly or according to recommendations, and advised to stay away from smoking. I have recommended yearly flu vaccine and pneumonia vaccination  at least every 5 years; moderate intensity exercise for up to 150 minutes weekly; and  sleep for at least 7 hours a day.  He is currently on amiodarone  related to his recent diagnosis of tachyarrhythmia-atrial flutter/atrial fibrillation.  He will need thyroid  function test at least twice a year.  His last thyroid  function test from August 07, 2021 showed TSH 1.16, free T4 1.75, this is consistent with euthyroid presentation.  His most recent TSH was 1.4 on Dec 09, 2023.   - I advised patient to maintain close follow up with Minus Amel, MD for primary care needs.    I spent  26  minutes in the care of the patient today including review of labs from CMP, Lipids, Thyroid  Function, Hematology (current and previous including abstractions from other facilities); face-to-face time discussing  his blood glucose readings/logs, discussing hypoglycemia and hyperglycemia episodes and symptoms, medications doses, his options of short and long term treatment based on the latest standards of care / guidelines;  discussion about incorporating lifestyle medicine;  and documenting the encounter. Risk reduction counseling performed per USPSTF guidelines to reduce  obesity and cardiovascular risk factors.     Please refer to Patient Instructions for Blood Glucose Monitoring and Insulin /Medications Dosing Guide"  in media tab for additional information. Please  also refer to " Patient Self Inventory" in the Media  tab for reviewed elements of pertinent patient history.  Kyle Hicks participated in the discussions, expressed understanding, and voiced agreement with the above plans.  All questions were answered to his satisfaction. he is encouraged to contact clinic should he have any questions or concerns prior to his return visit.     Follow up plan: -Return in about 6 months (around 06/28/2024) for Bring Meter/CGM Device/Logs- A1c in Office.  Kalvin Orf, MD Phone: [phone]  Fax: [phone]  -  This  note was partially dictated with voice recognition software. Similar sounding words can be transcribed inadequately or may not  be corrected upon review.  12/28/2023, 10:32 AM

## 2024-01-11 ENCOUNTER — Other Ambulatory Visit (HOSPITAL_COMMUNITY): Payer: Self-pay | Admitting: Family Medicine

## 2024-01-13 ENCOUNTER — Ambulatory Visit (HOSPITAL_COMMUNITY)
Admission: RE | Admit: 2024-01-13 | Discharge: 2024-01-13 | Disposition: A | Discharge status: Home / Self Care | Attending: Vascular Surgery | Admitting: Vascular Surgery

## 2024-01-13 ENCOUNTER — Other Ambulatory Visit: Payer: Self-pay

## 2024-01-13 ENCOUNTER — Encounter (HOSPITAL_COMMUNITY)
Admission: RE | Disposition: A | Discharge status: Home / Self Care | Payer: Self-pay | Source: Home / Self Care | Attending: Vascular Surgery

## 2024-01-13 DIAGNOSIS — E1122 Type 2 diabetes mellitus with diabetic chronic kidney disease: Secondary | ICD-10-CM | POA: Diagnosis not present

## 2024-01-13 DIAGNOSIS — I132 Hypertensive heart and chronic kidney disease with heart failure and with stage 5 chronic kidney disease, or end stage renal disease: Secondary | ICD-10-CM | POA: Insufficient documentation

## 2024-01-13 DIAGNOSIS — T82858A Stenosis of vascular prosthetic devices, implants and grafts, initial encounter: Secondary | ICD-10-CM

## 2024-01-13 DIAGNOSIS — Z7984 Long term (current) use of oral hypoglycemic drugs: Secondary | ICD-10-CM | POA: Diagnosis not present

## 2024-01-13 DIAGNOSIS — N186 End stage renal disease: Secondary | ICD-10-CM | POA: Diagnosis not present

## 2024-01-13 DIAGNOSIS — Z992 Dependence on renal dialysis: Secondary | ICD-10-CM | POA: Diagnosis not present

## 2024-01-13 DIAGNOSIS — Z87891 Personal history of nicotine dependence: Secondary | ICD-10-CM | POA: Diagnosis not present

## 2024-01-13 DIAGNOSIS — I5031 Acute diastolic (congestive) heart failure: Secondary | ICD-10-CM | POA: Diagnosis not present

## 2024-01-13 DIAGNOSIS — Y832 Surgical operation with anastomosis, bypass or graft as the cause of abnormal reaction of the patient, or of later complication, without mention of misadventure at the time of the procedure: Secondary | ICD-10-CM | POA: Diagnosis not present

## 2024-01-13 HISTORY — PX: VENOUS ANGIOPLASTY: CATH118376

## 2024-01-13 HISTORY — PX: A/V SHUNT INTERVENTION: CATH118220

## 2024-01-13 LAB — GLUCOSE, CAPILLARY: Glucose-Capillary: 151 mg/dL — ABNORMAL HIGH (ref 70–99)

## 2024-01-13 SURGERY — A/V SHUNT INTERVENTION
Anesthesia: LOCAL | Site: Arm Upper | Laterality: Left

## 2024-01-13 MED ORDER — LIDOCAINE HCL (PF) 1 % IJ SOLN
INTRAMUSCULAR | Status: AC
  Filled 2024-01-13: qty 30

## 2024-01-13 MED ORDER — IODIXANOL 320 MG/ML IV SOLN
INTRAVENOUS | Status: DC | PRN
  Administered 2024-01-13: 15 mL

## 2024-01-13 MED ORDER — LIDOCAINE HCL (PF) 1 % IJ SOLN
INTRAMUSCULAR | Status: DC | PRN
  Administered 2024-01-13: 2 mL via INTRADERMAL

## 2024-01-13 MED ORDER — HEPARIN (PORCINE) IN NACL 1000-0.9 UT/500ML-% IV SOLN
INTRAVENOUS | Status: DC | PRN
  Administered 2024-01-13: 500 mL

## 2024-01-13 SURGICAL SUPPLY — 11 items
BALLOON MUSTANG 6X80X75 (BALLOONS) IMPLANT
BALLOON MUSTANG 7X100X75 (BALLOONS) IMPLANT
KIT ENCORE 26 ADVANTAGE (KITS) IMPLANT
KIT MICROPUNCTURE NIT STIFF (SHEATH) IMPLANT
KIT PV (KITS) ×3 IMPLANT
SHEATH PINNACLE 6F 10CM (SHEATH) IMPLANT
SHEATH PINNACLE R/O II 6F 4CM (SHEATH) IMPLANT
SHEATH PROBE COVER 6X72 (BAG) IMPLANT
TRAY PV CATH (CUSTOM PROCEDURE TRAY) ×2 IMPLANT
TUBING CIL FLEX 10 FLL-RA (TUBING) IMPLANT
WIRE BENTSON .035X145CM (WIRE) IMPLANT

## 2024-01-13 NOTE — Op Note (Signed)
    Patient name: Kyle Hicks MRN: [id number] DOB: 04/25/1941 Sex: male  01/13/2024 Pre-operative Diagnosis: Malfunctioning left upper arm AV graft Post-operative diagnosis:  Same Surgeon:  Young Hensen, MD Procedure Performed: 1.  Ultrasound-guided access left upper arm AV graft 2.  Left arm fistulogram including central venogram 3.  Left peripheral angioplasty left upper arm AV graft (6 mm x 80 mm Mustang and 7 mm x 100 mm Mustang)  Indications: 83 year old male with end-stage renal disease presents for left arm fistulogram with malfunctioning left upper arm AV graft.  He presents after risks benefits discussed.  Findings:   Ultrasound-guided access left upper arm AV graft.  Fistulogram showed no evidence of central venous stenosis.  The left upper arm AV graft was diffusely diseased with high-grade stenosis over 80% proximal and distal to the existing stent in the mid graft.  Ultimately used a 6 mm x 80 mm Mustang and then a 7 mm x 100 mm Mustang and treated balloon angioplasty of the entire diseased segment of the left upper arm AVG.  Proximally the anastomosis looked widely patent   Procedure:  The patient was identified in the holding area and taken to [facility] lab.  Patient was placed on the procedure table.  The left arm was prepped and draped standard sterile fashion.  Timeout was performed.  Ultrasound was used to access the left upper arm AV graft with micro access needle and placed a microwire and micro sheath after injecting 1% lidocaine  without epinephrine .  Left upper extremity fistulogram including central venogram was obtained.  Pertinent findings are noted above.  Ultimately elected for intervention.  I used a Bentson wire and exchanged for a short 6 [place] sheath.  The entire diseased segment including the previous stent was treated in the peripheral segment with a 6 mm x 80 mm Mustang to nominal pressure for 2 minutes in each segment.  I then shot a retrograde shot that  showed the arterial anastomosis was widely patent.  I then upsized to a 7 mm x 100 mm Mustang to nominal pressure for 2 minutes over the entire segment of the graft..  Much better thrill and no significant residual stenosis all less than 30%.  The sheath was removed and a 4-0 Monocryl was tied down and we remove the sheath with good hemostasis.     Young Hensen, MD [facility] Office: [phone]

## 2024-01-13 NOTE — H&P (Signed)
 H&P     History of Present Illness: This is a 83 y.o. male with end-stage renal disease that presents for fistulogram for a left upper arm AV graft due to malfunction.  States he is here because his nephrologist told him to come  Past Medical History:  Diagnosis Date   Chronic kidney disease    Diabetes mellitus without complication [facility])    Dialysis patient [facility])    Diastolic CHF, acute (HCC) 10/08/2020   HTN (hypertension)     Past Surgical History:  Procedure Laterality Date   A/V FISTULAGRAM N/A 09/26/2023   Procedure: A/V Fistulagram;  Surgeon: Patrick Boor, MD;  [facility];  Service: Cardiovascular;  Laterality: N/A;   AV FISTULA PLACEMENT Left 01/22/2021   Procedure: LEFT UPPER ARM ARTERIOVENOUS GORE-TEX GRAFT;  Surgeon: Mayo Speck, MD;  Location: [facility] ORS;  Service: Vascular;  Laterality: Left;   CARDIOVERSION N/A 10/10/2020   Procedure: CARDIOVERSION;  Surgeon: Lenise Quince, MD;  Location: [facility] ENDOSCOPY;  Service: Cardiovascular;  Laterality: N/A;   ESOPHAGOGASTRODUODENOSCOPY N/A 04/27/2017   Procedure: ESOPHAGOGASTRODUODENOSCOPY (EGD);  Surgeon: Ruby Corporal, MD;  Location: [facility] ENDO SU[facility];  Service: Endoscopy;  Laterality: N/A;   IR FLUORO GUIDE CV LINE RIGHT  10/23/2020   IR US  GUIDANCE  10/23/2020   PERIPHERAL VASCULAR BALLOON ANGIOPLASTY  09/26/2023   Procedure: PERIPHERAL VASCULAR BALLOON ANGIOPLASTY;  Surgeon: Patrick Boor, MD;  [facility];  Service: Cardiovascular;;  Venous Anastomosis; Intragraft - Outflow Edge of Stent   RIGHT HEART CATH N/A 10/16/2020   Procedure: RIGHT HEART CATH;  Surgeon: Mardell Shade, MD;  [facility];  Service: Cardiovascular;  Laterality: N/A;   RIGHT HEART CATH AND CORONARY ANGIOGRAPHY N/A 10/24/2020   Procedure: RIGHT HEART CATH AND CORONARY ANGIOGRAPHY;  Surgeon: Mardell Shade, MD;  [facility];  Service: Cardiovascular;  Laterality: N/A;   TEE WITHOUT  CARDIOVERSION N/A 10/10/2020   Procedure: TRANSESOPHAGEAL ECHOCARDIOGRAM (TEE);  Surgeon: Lenise Quince, MD;  Location: [facility] ENDOSCOPY;  Service: Cardiovascular;  Laterality: N/A;   TEMPORARY DIALYSIS CATHETER  10/16/2020   Procedure: TEMPORARY DIALYSIS CATHETER;  Surgeon: Mardell Shade, MD;  [facility];  Service: Cardiovascular;;    No Known Allergies  Prior to Admission medications   Medication Sig Start Date End Date Taking? Authorizing Provider  apixaban  (ELIQUIS ) 2.5 MG TABS tablet Take 1 tablet (2.5 mg total) by mouth 2 (two) times daily. 12/09/23   Milford, Arlice Bene, FNP  atorvastatin  (LIPITOR) 20 MG tablet Take 1 tablet by mouth once daily 10/25/23   Elmarie Hacking, FNP  blood glucose meter kit and supplies KIT Dispense based on patient and insurance preference. Use up to 2 times daily as directed. (FOR ICD-10 E11.65) 04/12/18   Nida, Gebreselassie W, MD  Blood Glucose Monitoring Suppl (ACCU-CHEK GUIDE ME) w/Device KIT 1 Piece by Does not apply route as directed. 12/28/23   Nida, Gebreselassie W, MD  carboxymethylcellulose 1 % ophthalmic solution Place 1 drop into both eyes daily as needed (red eye).    [provider]  glucose blood (ONETOUCH VERIO) test strip Use as instructed to check blood glucose twice daily. Dx Code: E11.65 03/25/21   Nida, Gebreselassie W, MD  lanthanum (FOSRENOL) 1000 MG chewable tablet Chew 1,000 mg by mouth 3 (three) times daily with meals.    [provider]  midodrine  (PROAMATINE ) 10 MG tablet TAKE 1 TABLET BY MOUTH  THREE TIMES A WEEK 12/27/23   Milford, Arlice Bene, FNP  OneTouch Delica Lancets 33G MISC Use to check blood glucose twice daily. Dx Code: E11.65 03/25/21   Nida, Gebreselassie W, MD  PACERONE  100 MG tablet TAKE 1 TABLET BY MOUTH ONCE DAILY. APPOINMENT NEEDED FOR FUTURE REFILLS 12/08/23   Elmarie Hacking, FNP  torsemide  (DEMADEX ) 100 MG tablet TAKE 1 TABLET BY MOUTH THREE TIMES A WEEK . APPOINTMENT REQUIRED FOR  FUTURE REFILLS 12/08/23   Elmarie Hacking, FNP  TRADJENTA  5 MG TABS tablet Take 1 tablet by mouth once daily 11/03/23   Nida, Gebreselassie W, MD  trolamine salicylate (ASPERCREME) 10 % cream Apply 1 Application topically daily as needed for muscle pain (on arm).    [provider]    Social History   Socioeconomic History   Marital status: Married    Spouse name: Not on file   Number of children: Not on file   Years of education: Not on file   Highest education level: Not on file  Occupational History   Not on file  Tobacco Use   Smoking status: Former    Current packs/day: 0.00    Types: Cigarettes    Quit date: 07/26/1987    Years since quitting: 36.4   Smokeless tobacco: Never  Vaping Use   Vaping status: Never Used  Substance and Sexual Activity   Alcohol use: No    Alcohol/week: 0.0 standard drinks of alcohol    Comment: Previously drank socially, last drank 25-30 years ago   Drug use: No   Sexual activity: Not on file  Other Topics Concern   Not on file  Social History Narrative   Not on file   Social Drivers of Health   Financial Resource Strain: Low Risk  (01/17/2023)   Overall Financial Resource Strain (CARDIA)    Difficulty of Paying Living Expenses: Not very hard  Food Insecurity: No Food Insecurity (01/17/2023)   Hunger Vital Sign    Worried About Running Out of Food in the Last Year: Never true    Ran Out of Food in the Last Year: Never true  Transportation Needs: No Transportation Needs (01/31/2023)   PRAPARE - Administrator, Civil Service (Medical): No    Lack of Transportation (Non-Medical): No  Physical Activity: Inactive (01/31/2023)   Exercise Vital Sign    Days of Exercise per Week: 0 days    Minutes of Exercise per Session: 0 min  Stress: Not on file  Social Connections: Not on file  Intimate Partner Violence: Not on file     Family History  Problem Relation Age of Onset   Hypertension Mother    Hyperlipidemia Mother     Hypertension Father    Hyperlipidemia Father    Colon cancer Neg Hx     ROS: [x]  Positive   [ ]  Negative   [ ]  All sytems reviewed and are negative  Cardiovascular: []  chest pain/pressure []  palpitations []  SOB lying flat []  DOE []  pain in legs while walking []  pain in legs at rest []  pain in legs at night []  non-healing ulcers []  hx of DVT []  swelling in legs  Pulmonary: []  productive cough []  asthma/wheezing []  home O2  Neurologic: []  weakness in []  arms []  legs []  numbness in []  arms []  legs []  hx of CVA []  mini stroke [] difficulty speaking or slurred speech []  temporary loss of vision in one eye []  dizziness  Hematologic: []  hx of cancer []   bleeding problems []  problems with blood clotting easily  Endocrine:   []  diabetes []  thyroid  disease  GI []  vomiting blood []  blood in stool  GU: []  CKD/renal failure []  HD--[]  M/W/F or []  T/T/S []  burning with urination []  blood in urine  Psychiatric: []  anxiety []  depression  Musculoskeletal: []  arthritis []  joint pain  Integumentary: []  rashes []  ulcers  Constitutional: []  fever []  chills   Physical Examination  There were no vitals filed for this visit. There is no height or weight on file to calculate BMI.  General:  WDWN in NAD Gait: Not observed HENT: WNL, normocephalic Pulmonary: normal non-labored breathing Cardiac: regular, without  Murmurs, rubs or gallops Abdomen:  soft, NT/ND ]Vascular Exam/Pulses: Left upper arm AV graft pulsatile  CBC    Component Value Date/Time   WBC 7.0 06/23/2022 0922   RBC 4.08 (L) 06/23/2022 0922   HGB 13.3 06/23/2022 0922   HCT 40.6 06/23/2022 0922   PLT 159 06/23/2022 0922   MCV 99.5 06/23/2022 0922   MCH 32.6 06/23/2022 0922   MCHC 32.8 06/23/2022 0922   RDW 14.0 06/23/2022 0922   LYMPHSABS 1.0 10/07/2020 0229   MONOABS 0.5 10/07/2020 0229   EOSABS 0.0 10/07/2020 0229   BASOSABS 0.0 10/07/2020 0229    BMET    Component Value Date/Time    NA 135 09/13/2022 0817   K 4.1 09/13/2022 0817   CL 91 (L) 09/13/2022 0817   CO2 25 09/13/2022 0817   GLUCOSE 119 (H) 09/13/2022 0817   GLUCOSE 85 02/13/2021 1203   BUN 51 (H) 09/13/2022 0817   CREATININE 6.82 (H) 09/13/2022 0817   CREATININE 1.55 (H) 04/08/2020 0724   CALCIUM  9.5 09/13/2022 0817   GFRNONAA 10 (L) 02/13/2021 1203   GFRNONAA 43 (L) 04/08/2020 0724   GFRAA 49 (L) 04/08/2020 0724    COAGS: Lab Results  Component Value Date   INR 1.4 (H) 10/24/2020   INR 1.22 04/24/2017     Non-Invasive Vascular Imaging:      ASSESSMENT/PLAN: This is a 83 y.o. male with end-stage renal disease that presents for fistulogram for a left upper arm AV graft due to malfunction.  Discussed plan for left upper extremity fistulogram with possible angioplasty and/or stent versus surgical revision.  Risk benefits discussed questions answered  Young Hensen, MD [facility] [place] Office: [phone]  Young Hensen

## 2024-01-16 ENCOUNTER — Encounter (HOSPITAL_COMMUNITY): Payer: Self-pay | Admitting: Vascular Surgery

## 2024-01-17 ENCOUNTER — Other Ambulatory Visit (HOSPITAL_COMMUNITY): Payer: Self-pay | Admitting: Family Medicine

## 2024-01-23 DIAGNOSIS — N186 End stage renal disease: Secondary | ICD-10-CM | POA: Diagnosis not present

## 2024-01-23 DIAGNOSIS — I150 Renovascular hypertension: Secondary | ICD-10-CM | POA: Diagnosis not present

## 2024-01-23 DIAGNOSIS — Z992 Dependence on renal dialysis: Secondary | ICD-10-CM | POA: Diagnosis not present

## 2024-02-01 ENCOUNTER — Ambulatory Visit (HOSPITAL_COMMUNITY)

## 2024-02-09 ENCOUNTER — Other Ambulatory Visit: Payer: Self-pay | Admitting: "Endocrinology

## 2024-02-09 ENCOUNTER — Other Ambulatory Visit (HOSPITAL_COMMUNITY): Payer: Self-pay | Admitting: Family Medicine

## 2024-02-10 ENCOUNTER — Ambulatory Visit (HOSPITAL_COMMUNITY)
Admission: RE | Admit: 2024-02-10 | Discharge: 2024-02-10 | Disposition: A | Discharge status: Home / Self Care | Source: Ambulatory Visit | Attending: Cardiology | Admitting: Cardiology

## 2024-02-10 DIAGNOSIS — I5022 Chronic systolic (congestive) heart failure: Secondary | ICD-10-CM | POA: Diagnosis not present

## 2024-02-10 LAB — ECHOCARDIOGRAM COMPLETE
AR max vel: 1.39 cm2
AV Area VTI: 1.44 cm2
AV Area mean vel: 1.35 cm2
AV Mean grad: 4.5 mmHg
AV Peak grad: 9 mmHg
Ao pk vel: 1.5 m/s
Area-P 1/2: 3.12 cm2
P 1/2 time: 878 ms
S' Lateral: 4.1 cm

## 2024-02-13 ENCOUNTER — Other Ambulatory Visit (HOSPITAL_COMMUNITY): Payer: Self-pay | Admitting: Family Medicine

## 2024-02-23 DIAGNOSIS — I150 Renovascular hypertension: Secondary | ICD-10-CM | POA: Diagnosis not present

## 2024-02-23 DIAGNOSIS — N186 End stage renal disease: Secondary | ICD-10-CM | POA: Diagnosis not present

## 2024-02-23 DIAGNOSIS — Z992 Dependence on renal dialysis: Secondary | ICD-10-CM | POA: Diagnosis not present

## 2024-03-06 ENCOUNTER — Other Ambulatory Visit (HOSPITAL_COMMUNITY): Payer: Self-pay | Admitting: Family Medicine

## 2024-03-15 ENCOUNTER — Other Ambulatory Visit: Payer: Self-pay | Admitting: "Endocrinology

## 2024-03-23 DIAGNOSIS — N186 End stage renal disease: Secondary | ICD-10-CM | POA: Diagnosis not present

## 2024-03-23 DIAGNOSIS — Z0001 Encounter for general adult medical examination with abnormal findings: Secondary | ICD-10-CM | POA: Diagnosis not present

## 2024-03-23 DIAGNOSIS — E6609 Other obesity due to excess calories: Secondary | ICD-10-CM | POA: Diagnosis not present

## 2024-03-23 DIAGNOSIS — Z1331 Encounter for screening for depression: Secondary | ICD-10-CM | POA: Diagnosis not present

## 2024-03-23 DIAGNOSIS — E1101 Type 2 diabetes mellitus with hyperosmolarity with coma: Secondary | ICD-10-CM | POA: Diagnosis not present

## 2024-03-23 DIAGNOSIS — I509 Heart failure, unspecified: Secondary | ICD-10-CM | POA: Diagnosis not present

## 2024-03-23 DIAGNOSIS — Z6830 Body mass index (BMI) 30.0-30.9, adult: Secondary | ICD-10-CM | POA: Diagnosis not present

## 2024-03-25 DIAGNOSIS — N186 End stage renal disease: Secondary | ICD-10-CM | POA: Diagnosis not present

## 2024-03-25 DIAGNOSIS — Z992 Dependence on renal dialysis: Secondary | ICD-10-CM | POA: Diagnosis not present

## 2024-03-25 DIAGNOSIS — I150 Renovascular hypertension: Secondary | ICD-10-CM | POA: Diagnosis not present

## 2024-04-11 ENCOUNTER — Ambulatory Visit (HOSPITAL_COMMUNITY)
Admission: RE | Admit: 2024-04-11 | Discharge: 2024-04-11 | Disposition: A | Discharge status: Home / Self Care | Attending: Vascular Surgery | Admitting: Vascular Surgery

## 2024-04-11 ENCOUNTER — Encounter (HOSPITAL_COMMUNITY)
Admission: RE | Disposition: A | Discharge status: Home / Self Care | Payer: Self-pay | Source: Home / Self Care | Attending: Vascular Surgery

## 2024-04-11 ENCOUNTER — Other Ambulatory Visit: Payer: Self-pay

## 2024-04-11 ENCOUNTER — Encounter (HOSPITAL_COMMUNITY): Payer: Self-pay | Admitting: Vascular Surgery

## 2024-04-11 DIAGNOSIS — Z992 Dependence on renal dialysis: Secondary | ICD-10-CM | POA: Diagnosis not present

## 2024-04-11 DIAGNOSIS — I132 Hypertensive heart and chronic kidney disease with heart failure and with stage 5 chronic kidney disease, or end stage renal disease: Secondary | ICD-10-CM | POA: Insufficient documentation

## 2024-04-11 DIAGNOSIS — T82858A Stenosis of vascular prosthetic devices, implants and grafts, initial encounter: Secondary | ICD-10-CM | POA: Diagnosis not present

## 2024-04-11 DIAGNOSIS — I5032 Chronic diastolic (congestive) heart failure: Secondary | ICD-10-CM | POA: Insufficient documentation

## 2024-04-11 DIAGNOSIS — Y832 Surgical operation with anastomosis, bypass or graft as the cause of abnormal reaction of the patient, or of later complication, without mention of misadventure at the time of the procedure: Secondary | ICD-10-CM | POA: Insufficient documentation

## 2024-04-11 DIAGNOSIS — Z87891 Personal history of nicotine dependence: Secondary | ICD-10-CM | POA: Insufficient documentation

## 2024-04-11 DIAGNOSIS — N186 End stage renal disease: Secondary | ICD-10-CM | POA: Insufficient documentation

## 2024-04-11 DIAGNOSIS — E1122 Type 2 diabetes mellitus with diabetic chronic kidney disease: Secondary | ICD-10-CM | POA: Insufficient documentation

## 2024-04-11 HISTORY — PX: A/V SHUNT INTERVENTION: CATH118220

## 2024-04-11 HISTORY — PX: A/V FISTULAGRAM: CATH118298

## 2024-04-11 LAB — GLUCOSE, CAPILLARY: Glucose-Capillary: 137 mg/dL — ABNORMAL HIGH (ref 70–99)

## 2024-04-11 SURGERY — A/V SHUNT INTERVENTION
Anesthesia: LOCAL | Site: Arm Upper

## 2024-04-11 MED ORDER — LIDOCAINE HCL (PF) 1 % IJ SOLN
INTRAMUSCULAR | Status: DC | PRN
  Administered 2024-04-11: 5 mL

## 2024-04-11 MED ORDER — LIDOCAINE HCL (PF) 1 % IJ SOLN
INTRAMUSCULAR | Status: AC
  Filled 2024-04-11: qty 30

## 2024-04-11 MED ORDER — IODIXANOL 320 MG/ML IV SOLN
INTRAVENOUS | Status: DC | PRN
  Administered 2024-04-11: 25 mL via INTRA_ARTERIAL

## 2024-04-11 MED ORDER — HEPARIN (PORCINE) IN NACL 1000-0.9 UT/500ML-% IV SOLN
INTRAVENOUS | Status: DC | PRN
  Administered 2024-04-11: 500 mL via SURGICAL_CAVITY

## 2024-04-11 SURGICAL SUPPLY — 8 items
BALLOON MUSTANG 7X100X75 (BALLOONS) IMPLANT
KIT ENCORE 26 ADVANTAGE (KITS) IMPLANT
KIT MICROPUNCTURE NIT STIFF (SHEATH) IMPLANT
SHEATH PINNACLE R/O II 6F 4CM (SHEATH) IMPLANT
SHEATH PROBE COVER 6X72 (BAG) IMPLANT
TRAY PV CATH (CUSTOM PROCEDURE TRAY) ×3 IMPLANT
TUBING CIL FLEX 10 FLL-RA (TUBING) IMPLANT
WIRE BENTSON .035X145CM (WIRE) IMPLANT

## 2024-04-11 NOTE — H&P (Signed)
 HD ACCESS CENTER H&P   Patient ID: Kyle Hicks, male   DOB: 08-Jan-1941, 83 y.o.   MRN: [id number]  Subjective:     HPI Kyle Hicks is a 83 y.o. male with ESRD presenting to the HD access center for intervention.  Past Medical History:  Diagnosis Date   Chronic kidney disease    Diabetes mellitus without complication (HCC)    Dialysis patient [facility])    Diastolic CHF, acute (HCC) 10/08/2020   HTN (hypertension)    Family History  Problem Relation Age of Onset   Hypertension Mother    Hyperlipidemia Mother    Hypertension Father    Hyperlipidemia Father    Colon cancer Neg Hx    Past Surgical History:  Procedure Laterality Date   A/V FISTULAGRAM N/A 09/26/2023   Procedure: A/V Fistulagram;  Surgeon: Melia Lynwood ORN, MD;  Location: [facility] INVASIVE CV LAB;  Service: Cardiovascular;  Laterality: N/A;   A/V SHUNT INTERVENTION Left 01/13/2024   Procedure: A/V SHUNT INTERVENTION;  Surgeon: Gretta Lonni PARAS, MD;  Location: [facility] PV LAB;  Service: Cardiovascular;  Laterality: Left;   AV FISTULA PLACEMENT Left 01/22/2021   Procedure: LEFT UPPER ARM ARTERIOVENOUS GORE-TEX GRAFT;  Surgeon: Oris Krystal FALCON, MD;  Location: [facility] ORS;  Service: Vascular;  Laterality: Left;   CARDIOVERSION N/A 10/10/2020   Procedure: CARDIOVERSION;  Surgeon: Pietro Redell RAMAN, MD;  Location: [facility] ENDOSCOPY;  Service: Cardiovascular;  Laterality: N/A;   ESOPHAGOGASTRODUODENOSCOPY N/A 04/27/2017   Procedure: ESOPHAGOGASTRODUODENOSCOPY (EGD);  Surgeon: Golda Claudis PENNER, MD;  Location: [facility] ENDO [facility];  Service: Endoscopy;  Laterality: N/A;   IR FLUORO GUIDE CV LINE RIGHT  10/23/2020   IR US  GUIDANCE  10/23/2020   PERIPHERAL VASCULAR BALLOON ANGIOPLASTY  09/26/2023   Procedure: PERIPHERAL VASCULAR BALLOON ANGIOPLASTY;  Surgeon: Melia Lynwood ORN, MD;  [facility] IN[facility] LAB;  Service: Cardiovascular;;  Venous Anastomosis; Intragraft - Outflow Edge of Stent   RIGHT HEART CATH N/A 10/16/2020   Procedure: RIGHT HEART CATH;  Surgeon:  Cherrie Toribio SAUNDERS, MD;  Location: [facility] INVAS[facility] CV LAB;  Service: Cardiovascular;  Laterality: N/A;   RIGHT HEART CATH AND CORONARY ANGIOGRAPHY N/A 10/24/2020   Procedure: RIGHT HEART CATH AND CORONARY ANGIOGRAPHY;  Surgeon: Cherrie Toribio SAUNDERS, MD;  Location: [facility];  Service: Cardiovascular;  Laterality: N/A;   TEE WITHOUT CARDIOVERSION N/A 10/10/2020   Procedure: TRANSESOPHAGEAL ECHOCARDIOGRAM (TEE);  Surgeon: Pietro Redell RAMAN, MD;  Location: [facility] ENDOSCOPY;  Service: Cardiovascular;  Laterality: N/A;   TEMPORARY DIALYSIS CATHETER  10/16/2020   Procedure: TEMPORARY DIALYSIS CATHETER;  Surgeon: Cherrie Toribio SAUNDERS, MD;  [facility];  Service: Cardiovascular;;   VENOUS ANGIOPLASTY  01/13/2024   Procedure: VENOUS ANGIOPLASTY;  Surgeon: Gretta Lonni PARAS, MD;  Location: [facility];  Service: Cardiovascular;;    Short Social History:  Social History   Tobacco Use   Smoking status: Former    Current packs/day: 0.00    Types: Cigarettes    Quit date: 07/26/1987    Years since quitting: 36.7   Smokeless tobacco: Never  Substance Use Topics   Alcohol use: No    Alcohol/week: 0.0 standard drinks of alcohol    Comment: Previously drank socially, last drank 25-30 years ago    No Known Allergies  No current facility-administered medications for this encounter.    REVIEW OF SYSTEMS All other systems were reviewed and are negative     Objective:   Objective   Vitals:  04/11/24 0910  BP: 114/70  Pulse: 73  Resp: 14  SpO2: 97%   There is no height or weight on file to calculate BMI.  Physical Exam General: no acute distress Cardiac: hemodynamically stable Extremities: Right arm graft with faint thrill  Data: Reviewed fistulogram performed by Dr. Gretta in June. The entire graft was treated with a 6 and 7 mm Mustang balloon.     Assessment/Plan:   Kyle Hicks is a 83 y.o. male with ESRD presenting for fistulogram.  Having issues with low  flows. Reviewed risks and benefits of fistulogram with intervention and patient agreed to proceed.   Norman Serve, MD [facility]

## 2024-04-11 NOTE — Op Note (Addendum)
    Patient name: [name] MRN: [id number] DOB: 09/18/1940 Sex: male  04/11/2024 Pre-operative Diagnosis: ESRD on HD Post-operative diagnosis:  Same Surgeon:  Norman GORMAN Serve, MD Procedure Performed:  Ultrasound access of dialysis circuit, fistulogram and central venogram, peripheral balloon angioplasty.  [id number]  Indications: Mr. Kyle Hicks is an 83 year old male with ESRD on HD is presenting for fistulogram to the HD access center.  He has been having issues with low flows during sessions.  He recently underwent a fistulogram with balloon angioplasty of the graft in June.  Risks and benefits of fistulogram were reviewed and he elected to proceed.  Findings:  Widely patent central venous system.  Widely patent venous anastomosis.  The entirety of the graft is severely diseased.  The proximal aspect of the graft and arterial anastomosis are patent.   Procedure:  The patient was identified in the holding area and taken to the cath lab  The patient was then placed supine on the table and prepped and draped in the usual sterile fashion.  A time out was called.  Ultrasound was used to evaluate the left arm AV access. This was accessed under u/s guidance. An 018 wire was advanced without resistance, a micropuncture sheath was placed and fistulagram obtained which demonstrated the above findings.  This access was then upsized to a 13F short sheath over a glidewire.  A Bentson wire was then placed through the graft and into the central venous system.  The mid and distal segments of the graft were treated with a 7 mm x 100 mm Mustang balloon.  A completion fistulogram demonstrated patency with brisk flow and approximate 20 to 30% residual stenosis throughout the graft.  The wire and sheath were removed and the access was managed with a 4 Monocryl for hemostasis.  Contrast: 25 cc Sedation: None  Impression: Severe disease throughout the mid and distal segment of the graft, previously treated with a 7 mm  balloon.  Today was treated again with a 7 mm balloon throughout the graft.  Completion with brisk flow and approximate 20% to 30% residual stenosis. If this completely occludes in the coming months declot should not be attempted.  TDC should be placed in new access planned   Norman GORMAN Serve MD [facility] [place] Office: [phone]

## 2024-04-24 DIAGNOSIS — I150 Renovascular hypertension: Secondary | ICD-10-CM | POA: Diagnosis not present

## 2024-04-24 DIAGNOSIS — N186 End stage renal disease: Secondary | ICD-10-CM | POA: Diagnosis not present

## 2024-04-24 DIAGNOSIS — Z992 Dependence on renal dialysis: Secondary | ICD-10-CM | POA: Diagnosis not present

## 2024-05-25 DIAGNOSIS — Z992 Dependence on renal dialysis: Secondary | ICD-10-CM | POA: Diagnosis not present

## 2024-05-25 DIAGNOSIS — I150 Renovascular hypertension: Secondary | ICD-10-CM | POA: Diagnosis not present

## 2024-06-24 DIAGNOSIS — I150 Renovascular hypertension: Secondary | ICD-10-CM | POA: Diagnosis not present

## 2024-06-24 DIAGNOSIS — N186 End stage renal disease: Secondary | ICD-10-CM | POA: Diagnosis not present

## 2024-06-24 DIAGNOSIS — Z992 Dependence on renal dialysis: Secondary | ICD-10-CM | POA: Diagnosis not present

## 2024-06-29 ENCOUNTER — Ambulatory Visit: Admitting: "Endocrinology

## 2024-07-10 ENCOUNTER — Other Ambulatory Visit (HOSPITAL_COMMUNITY): Payer: Self-pay | Admitting: Family Medicine

## 2024-07-21 ENCOUNTER — Other Ambulatory Visit (HOSPITAL_COMMUNITY): Payer: Self-pay | Admitting: Family Medicine

## 2024-08-03 ENCOUNTER — Encounter: Payer: Self-pay | Admitting: "Endocrinology

## 2024-08-03 ENCOUNTER — Ambulatory Visit: Admitting: "Endocrinology

## 2024-08-03 VITALS — BP 94/50 | HR 56 | Ht 66.5 in | Wt 196.8 lb

## 2024-08-03 DIAGNOSIS — Z7984 Long term (current) use of oral hypoglycemic drugs: Secondary | ICD-10-CM | POA: Diagnosis not present

## 2024-08-03 DIAGNOSIS — N186 End stage renal disease: Secondary | ICD-10-CM | POA: Diagnosis not present

## 2024-08-03 DIAGNOSIS — E782 Mixed hyperlipidemia: Secondary | ICD-10-CM | POA: Diagnosis not present

## 2024-08-03 DIAGNOSIS — Z6831 Body mass index (BMI) 31.0-31.9, adult: Secondary | ICD-10-CM | POA: Diagnosis not present

## 2024-08-03 DIAGNOSIS — E1122 Type 2 diabetes mellitus with diabetic chronic kidney disease: Secondary | ICD-10-CM | POA: Diagnosis not present

## 2024-08-03 DIAGNOSIS — E669 Obesity, unspecified: Secondary | ICD-10-CM

## 2024-08-03 NOTE — Progress Notes (Signed)
 " 08/03/2024  Endocrinology follow-up note   Subjective:    Patient ID: Kyle Hicks, male    DOB: [birth year], PCP No primary care provider on file.   Past Medical History:  Diagnosis Date   Chronic kidney disease    Diabetes mellitus without complication [facility])    Dialysis patient    Diastolic CHF, acute (HCC) 10/08/2020   HTN (hypertension)    Past Surgical History:  Procedure Laterality Date   A/V FISTULAGRAM N/A 09/26/2023   Procedure: A/V Fistulagram;  Surgeon: Melia Lynwood ORN, MD;  Location: [facility] INVASIVE CV LAB;  Service: Cardiovascular;  Laterality: N/A;   A/V FISTULAGRAM N/A 04/11/2024   Procedure: A/V Fistulagram;  Surgeon: Pearline Norman RAMAN, MD;  Location: [facility] PV LAB;  Service: Cardiovascular;  Laterality: N/A;   A/V SHUNT INTERVENTION Left 01/13/2024   Procedure: A/V SHUNT INTERVENTION;  Surgeon: Gretta Lonni PARAS, MD;  Location: [facility] PV LAB;  Service: Cardiovascular;  Laterality: Left;   A/V SHUNT INTERVENTION Left 04/11/2024   Procedure: A/V SHUNT INTERVENTION;  Surgeon: Pearline Norman RAMAN, MD;  Location: [facility] PV LAB;  Service: Cardiovascular;  Laterality: Left;   AV FISTULA PLACEMENT Left 01/22/2021   Procedure: LEFT UPPER ARM ARTERIOVENOUS GORE-TEX GRAFT;  Surgeon: Oris Krystal FALCON, MD;  Location: [facility] ORS;  Service: Vascular;  Laterality: Left;   CARDIOVERSION N/A 10/10/2020   Procedure: CARDIOVERSION;  Surgeon: Pietro Redell RAMAN, MD;  Location: [facility] ENDOSCOPY;  Service: Cardiovascular;  Laterality: N/A;   ESOPHAGOGASTRODUODENOSCOPY N/A 04/27/2017   Procedure: ESOPHAGOGASTRODUODENOSCOPY (EGD);  Surgeon: Golda Claudis PENNER, MD;  Location: [facility] ENDO SUITE;  Service: Endoscopy;  Laterality: N/A;   IR FLUORO GUIDE CV LINE RIGHT  10/23/2020   IR US  GUIDANCE  10/23/2020   PERIPHERAL VASCULAR BALLOON ANGIOPLASTY  09/26/2023   Procedure: PERIPHERAL VASCULAR BALLOON ANGIOPLASTY;  Surgeon: Melia Lynwood ORN, MD;  Location: [facility] INVASIVE CV LAB;  Service: Cardiovascular;;  Venous Anastomosis; Intragraft -  Outflow Edge of Stent   RIGHT HEART CATH N/A 10/16/2020   Procedure: RIGHT HEART CATH;  Surgeon: Cherrie Toribio SAUNDERS, MD;  Location: [facility] INVASIVE CV LAB;  Service: Cardiovascular;  Laterality: N/A;   RIGHT HEART CATH AND CORONARY ANGIOGRAPHY N/A 10/24/2020   Procedure: RIGHT HEART CATH AND CORONARY ANGIOGRAPHY;  Surgeon: Cherrie Toribio SAUNDERS, MD;  Location: [facility] INVASIVE CV LAB;  Service: Cardiovascular;  Laterality: N/A;   TEE WITHOUT CARDIOVERSION N/A 10/10/2020   Procedure: TRANSESOPHAGEAL ECHOCARDIOGRAM (TEE);  Surgeon: Pietro Redell RAMAN, MD;  Location: [facility] ENDOSCOPY;  Service: Cardiovascular;  Laterality: N/A;   TEMPORARY DIALYSIS CATHETER  10/16/2020   Procedure: TEMPORARY DIALYSIS CATHETER;  Surgeon: Cherrie Toribio SAUNDERS, MD;  Location: [facility] INVASIVE CV LAB;  Service: Cardiovascular;;   VENOUS ANGIOPLASTY  01/13/2024   Procedure: VENOUS ANGIOPLASTY;  Surgeon: Gretta Lonni PARAS, MD;  Location: [facility] PV LAB;  Service: Cardiovascular;;   Social History   Socioeconomic History   Marital status: Married    Spouse name: Not on file   Number of children: Not on file   Years of education: Not on file   Highest education level: Not on file  Occupational History   Not on file  Tobacco Use   Smoking status: Former    Current packs/day: 0.00    Average packs/day: 0.5 packs/day    Types: Cigarettes    Quit date: 07/26/1987    Years since quitting: 37.0   Smokeless tobacco: Never  Vaping Use   Vaping status: Never Used  Substance and Sexual Activity   Alcohol  use: No    Alcohol/week: 0.0 standard drinks of alcohol    Comment: Previously drank socially, last drank 25-30 years ago   Drug use: No   Sexual activity: Not on file  Other Topics Concern   Not on file  Social History Narrative   Not on file   Social Drivers of Health   Tobacco Use: Medium Risk (08/03/2024)   Patient History    Smoking Tobacco Use: Former    Smokeless Tobacco Use: Never    Passive Exposure: Not on file  Financial  Resource Strain: Low Risk (01/17/2023)   Overall Financial Resource Strain (CARDIA)    Difficulty of Paying Living Expenses: Not very hard  Food Insecurity: No Food Insecurity (01/17/2023)   Hunger Vital Sign    Worried About Running Out of Food in the Last Year: Never true    Ran Out of Food in the Last Year: Never true  Transportation Needs: No Transportation Needs (01/31/2023)   PRAPARE - Administrator, Civil Service (Medical): No    Lack of Transportation (Non-Medical): No  Physical Activity: Inactive (01/31/2023)   Exercise Vital Sign    Days of Exercise per Week: 0 days    Minutes of Exercise per Session: 0 min  Stress: Not on file  Social Connections: Not on file  Depression (EYV7-0): Not on file  Alcohol Screen: Not on file  Housing: Low Risk (01/17/2023)   Housing    Last Housing Risk Score: 0  Utilities: Not At Risk (01/17/2023)   AHC Utilities    Threatened with loss of utilities: No  Health Literacy: Not on file   Outpatient Encounter Medications as of 08/03/2024  Medication Sig   amiodarone  (PACERONE ) 100 MG tablet Take 1 tablet (100 mg total) by mouth daily. PLEASE SCHEDULE APPOINTMENT FOR MORE REFILLS [phone] OPTION 2   apixaban  (ELIQUIS ) 2.5 MG TABS tablet Take 1 tablet (2.5 mg total) by mouth 2 (two) times daily.   atorvastatin  (LIPITOR) 20 MG tablet Take 1 tablet by mouth once daily   blood glucose meter kit and supplies KIT Dispense based on patient and insurance preference. Use up to 2 times daily as directed. (FOR ICD-10 E11.65)   Blood Glucose Monitoring Suppl (ACCU-CHEK GUIDE ME) w/Device KIT USE TO CHECK GLUCOSE DAILY AS DIRECTED.   carboxymethylcellulose 1 % ophthalmic solution Place 1 drop into both eyes daily as needed (red eye).   glucose blood (ONETOUCH VERIO) test strip Use as instructed to check blood glucose twice daily. Dx Code: E11.65   lanthanum (FOSRENOL) 1000 MG chewable tablet Chew 1,000 mg by mouth 3 (three) times daily with meals.    midodrine  (PROAMATINE ) 10 MG tablet TAKE 1 TABLET BY MOUTH THREE TIMES A WEEK   OneTouch Delica Lancets 33G MISC Use to check blood glucose twice daily. Dx Code: E11.65   torsemide  (DEMADEX ) 100 MG tablet TAKE 1 TABLET BY MOUTH THREE TIMES A WEEK   TRADJENTA  5 MG TABS tablet Take 1 tablet by mouth once daily   trolamine salicylate (ASPERCREME) 10 % cream Apply 1 Application topically daily as needed for muscle pain (on arm).   No facility-administered encounter medications on file as of 08/03/2024.   ALLERGIES: No Known Allergies VACCINATION STATUS: Immunization History  Administered Date(s) Administered   Moderna Sars-Covid-2 Vaccination 09/23/2019, 10/27/2019    Diabetes He presents for his follow-up diabetic visit. He has type 2 diabetes mellitus. The initial diagnosis of diabetes was made 2 years ago. His disease course has been  stable. There are no hypoglycemic associated symptoms. Pertinent negatives for hypoglycemia include no confusion, headaches, pallor or seizures. There are no diabetic associated symptoms. Pertinent negatives for diabetes include no chest pain, no fatigue, no polydipsia, no polyphagia, no polyuria and no weakness. There are no hypoglycemic complications. Symptoms are stable. Diabetic complications include nephropathy. Risk factors for coronary artery disease include diabetes mellitus, dyslipidemia, hypertension, male sex, obesity, sedentary lifestyle and tobacco exposure. He is compliant with treatment most of the time. His weight is fluctuating minimally (He lost approximately 60 pounds since last visit.). He is following a generally unhealthy diet. He has had a previous visit with a dietitian. He never participates in exercise. His home blood glucose trend is fluctuating minimally. His overall blood glucose range is 140-180 mg/dl. (He did bring his glucometer showing average blood glucose of 140-175 mg/day.  -175 his previsit A1c was 6.8% improving from 7.1%.    He is  taking only Tradjenta  5 mg p.o. daily for diabetes at this time.      ) An ACE inhibitor/angiotensin II receptor blocker is being taken.  Hypertension This is a chronic problem. The current episode started more than 1 year ago. The problem is uncontrolled. Pertinent negatives include no chest pain, headaches, neck pain, palpitations or shortness of breath. Risk factors for coronary artery disease include diabetes mellitus, dyslipidemia, obesity, sedentary lifestyle and smoking/tobacco exposure. Past treatments include angiotensin blockers. Identifiable causes of hypertension include chronic renal disease.  Hyperlipidemia This is a chronic problem. The current episode started more than 1 year ago. Exacerbating diseases include chronic renal disease, diabetes and obesity. Pertinent negatives include no chest pain, myalgias or shortness of breath. Current antihyperlipidemic treatment includes statins. Risk factors for coronary artery disease include diabetes mellitus, dyslipidemia, hypertension, family history, male sex, obesity and a sedentary lifestyle.      Objective:    BP (!) 94/50   Pulse (!) 56   Ht 5' 6.5 (1.689 m)   Wt 196 lb 12.8 oz (89.3 kg)   BMI 31.29 kg/m   Wt Readings from Last 3 Encounters:  08/03/24 196 lb 12.8 oz (89.3 kg)  12/28/23 194 lb 12.8 oz (88.4 kg)  12/09/23 195 lb 3.2 oz (88.5 kg)      Results for orders placed or performed during the hospital encounter of 04/11/24  Glucose, capillary   Collection Time: 04/11/24  9:11 AM  Result Value Ref Range   Glucose-Capillary 137 (H) 70 - 99 mg/dL   Diabetic Labs (most recent): Lab Results  Component Value Date   HGBA1C 7.1 (A) 12/28/2023   HGBA1C 7.6 (A) 02/09/2023   HGBA1C 6.8 09/20/2022   MICROALBUR 142.0 12/04/2019   MICROALBUR 1.2 06/20/2017   Lipid Panel     Component Value Date/Time   CHOL 145 12/09/2023 0852   CHOL 142 09/13/2022 0817   TRIG 83 12/09/2023 0852   HDL 62 12/09/2023 0852   HDL 64  09/13/2022 0817   CHOLHDL 2.3 12/09/2023 0852   VLDL 17 12/09/2023 0852   LDLCALC 66 12/09/2023 0852   LDLCALC 65 09/13/2022 0817   LDLCALC 85 04/08/2020 0724     Assessment & Plan:   1. 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     - He remains at a high risk for more acute and chronic complications of diabetes which include ESRD on dialysis, CAD, CVA, retinopathy, and neuropathy. These are all discussed in detail with the patient.  He did bring his glucometer showing  average blood glucose of 140-175 mg/day.  -175 his previsit A1c was 6.8% improving from 7.1%.    He is taking only Tradjenta  5 mg p.o. daily for diabetes at this time.    - I have re-counseled the patient on diet management and weight loss  by adopting a carbohydrate restricted / protein rich  Diet.  - he acknowledges that there is a room for improvement in his food and drink choices. - Suggestion is made for him to avoid simple carbohydrates  from his diet including Cakes, Sweet Desserts, Ice Cream, Soda (diet and regular), Sweet Tea, Candies, Chips, Cookies, Store Bought Juices, Alcohol in Excess of  1-2 drinks a day, Artificial Sweeteners,  Coffee Creamer, and Sugar-free Products, Lemonade. This will help patient to have more stable blood glucose profile and potentially avoid unintended weight gain.  - Patient is advised to stick to a routine mealtimes to eat 3 meals  a day and avoid unnecessary snacks ( to snack only to correct hypoglycemia).  - I have approached patient with the following individualized plan to manage diabetes and patient agrees.   -In light of his presentation with A1c of 6.8%, he will not need additional intervention except staying on Tradjenta  5 mg p.o. daily at breakfast.     -He is encouraged to monitor blood glucose at least once a day before breakfast and call clinic for blood glucose readings below 70 or greater than 200 mg/dl at fasting. - Patient specific  target  for A1c; LDL, HDL, Triglycerides,  were discussed in detail.  2) BP/HTN:  -His blood pressure is controlled at 94/50 mmHg, patient is asymptomatic.  He was taken off of Coreg  last visit.  Blood pressure management deferred to nephrology.  He is also  torsemide  100 mg p.o. daily.  He is on midodrine  for orthostatic hypotension.   3) Lipids/HPL: His recent lipid panel showed controlled LDL at 66.  He is advised to continue atorvastatin  20 mg p.o. nightly.      Side effects and precautions discussed with him.    4)  Weight/Diet: His BMI is 31.29 kg/m- He has lost weight related to initiation of hemodialysis.   5) Chronic Care/Health Maintenance:  -Patient is on ACEI/ARB and Statin medications and encouraged to continue to follow up with Ophthalmology, nephrology, cardiology, podiatrist at least yearly or according to recommendations, and advised to stay away from smoking. I have recommended yearly flu vaccine and pneumonia vaccination at least every 5 years; moderate intensity exercise for up to 150 minutes weekly; and  sleep for at least 7 hours a day.  He is currently on amiodarone  related to his recent diagnosis of tachyarrhythmia-atrial flutter/atrial fibrillation.  He will need thyroid  function test at least twice a year.  His last thyroid  function test from August 07, 2021 showed TSH 1.16, free T4 1.75, this is consistent with euthyroid presentation.  His most recent TSH was 1.4 on Dec 09, 2023.   - I advised patient to maintain close follow up with No primary care provider on file. for primary care needs.   I spent  28  minutes in the care of the patient today including review of labs from CMP, Lipids, Thyroid  Function, Hematology (current and previous including abstractions from other facilities); face-to-face time discussing  his blood glucose readings/logs, discussing hypoglycemia and hyperglycemia episodes and symptoms, medications doses, his options of short and long term  treatment based on the latest standards of care / guidelines;  discussion about incorporating lifestyle medicine;  and documenting the encounter. Risk reduction counseling performed per USPSTF guidelines to reduce  obesity and cardiovascular risk factors.     Please refer to Patient Instructions for Blood Glucose Monitoring and Insulin /Medications Dosing Guide  in media tab for additional information. Please  also refer to  Patient Self Inventory in the Media  tab for reviewed elements of pertinent patient history.  Kyle Hicks participated in the discussions, expressed understanding, and voiced agreement with the above plans.  All questions were answered to his satisfaction. he is encouraged to contact clinic should he have any questions or concerns prior to his return visit. Dear Patient: Feel free to review your progress notes.  If you are reviewing this progress note and have questions about the meaning of /or medical terms being used, please make a note and address it at your next follow-up appointment.  Medical notes are meant to be a communication tool between medical professionals and require medical terms to be used for efficiency and insurance approval.     Follow up plan: -Return in about 6 months (around 01/31/2025) for Bring Meter/CGM Device/Logs- A1c in Office.  Ranny Earl, MD Phone: [phone]  Fax: [phone]  -  This note was partially dictated with voice recognition software. Similar sounding words can be transcribed inadequately or may not  be corrected upon review.  08/03/2024, 1:30 PM "

## 2024-08-10 ENCOUNTER — Other Ambulatory Visit: Payer: Self-pay | Admitting: "Endocrinology

## 2024-08-10 ENCOUNTER — Other Ambulatory Visit (HOSPITAL_COMMUNITY): Payer: Self-pay | Admitting: Family Medicine

## 2024-08-15 ENCOUNTER — Other Ambulatory Visit (HOSPITAL_COMMUNITY): Payer: Self-pay | Admitting: Family Medicine

## 2024-12-24 ENCOUNTER — Ambulatory Visit: Payer: Self-pay

## 2025-02-01 ENCOUNTER — Ambulatory Visit: Admitting: "Endocrinology
